# Patient Record
Sex: Male | Born: 1935 | Race: Black or African American | Hispanic: No | Marital: Married | Smoking: Former smoker
Health system: Southern US, Community
[De-identification: ages and names within clinical notes are randomized; demographics above are authoritative.]

## PROBLEM LIST (undated history)

## (undated) ENCOUNTER — Emergency Department (HOSPITAL_COMMUNITY): Admission: EM | Payer: Medicare Other | Source: Home / Self Care

## (undated) DIAGNOSIS — N529 Male erectile dysfunction, unspecified: Secondary | ICD-10-CM

## (undated) DIAGNOSIS — H409 Unspecified glaucoma: Secondary | ICD-10-CM

## (undated) DIAGNOSIS — I10 Essential (primary) hypertension: Secondary | ICD-10-CM

## (undated) DIAGNOSIS — R739 Hyperglycemia, unspecified: Secondary | ICD-10-CM

## (undated) DIAGNOSIS — F0390 Unspecified dementia without behavioral disturbance: Secondary | ICD-10-CM

## (undated) DIAGNOSIS — T7840XA Allergy, unspecified, initial encounter: Secondary | ICD-10-CM

## (undated) DIAGNOSIS — E785 Hyperlipidemia, unspecified: Secondary | ICD-10-CM

## (undated) DIAGNOSIS — K648 Other hemorrhoids: Secondary | ICD-10-CM

## (undated) DIAGNOSIS — K5730 Diverticulosis of large intestine without perforation or abscess without bleeding: Secondary | ICD-10-CM

## (undated) DIAGNOSIS — K589 Irritable bowel syndrome without diarrhea: Secondary | ICD-10-CM

## (undated) HISTORY — DX: Hyperglycemia, unspecified: R73.9

## (undated) HISTORY — DX: Unspecified glaucoma: H40.9

## (undated) HISTORY — DX: Male erectile dysfunction, unspecified: N52.9

## (undated) HISTORY — DX: Unspecified dementia, unspecified severity, without behavioral disturbance, psychotic disturbance, mood disturbance, and anxiety: F03.90

## (undated) HISTORY — PX: NASAL SINUS SURGERY: SHX719

## (undated) HISTORY — PX: INGUINAL HERNIA REPAIR: SUR1180

## (undated) HISTORY — DX: Diverticulosis of large intestine without perforation or abscess without bleeding: K57.30

## (undated) HISTORY — PX: EYE SURGERY: SHX253

## (undated) HISTORY — PX: KNEE ARTHROSCOPY: SHX127

## (undated) HISTORY — DX: Allergy, unspecified, initial encounter: T78.40XA

## (undated) HISTORY — DX: Other hemorrhoids: K64.8

## (undated) HISTORY — DX: Hyperlipidemia, unspecified: E78.5

## (undated) HISTORY — DX: Essential (primary) hypertension: I10

## (undated) HISTORY — DX: Irritable bowel syndrome, unspecified: K58.9

---

## 1998-06-01 ENCOUNTER — Ambulatory Visit (HOSPITAL_BASED_OUTPATIENT_CLINIC_OR_DEPARTMENT_OTHER): Admission: RE | Admit: 1998-06-01 | Discharge: 1998-06-01 | Payer: Self-pay

## 1999-03-15 DIAGNOSIS — K648 Other hemorrhoids: Secondary | ICD-10-CM

## 1999-03-15 HISTORY — DX: Other hemorrhoids: K64.8

## 2002-03-12 ENCOUNTER — Encounter: Payer: Self-pay | Admitting: Orthopedic Surgery

## 2002-03-12 ENCOUNTER — Ambulatory Visit (HOSPITAL_COMMUNITY): Admission: RE | Admit: 2002-03-12 | Discharge: 2002-03-12 | Payer: Self-pay | Admitting: Orthopedic Surgery

## 2005-04-28 ENCOUNTER — Inpatient Hospital Stay (HOSPITAL_COMMUNITY): Admission: EM | Admit: 2005-04-28 | Discharge: 2005-04-30 | Payer: Self-pay | Admitting: Emergency Medicine

## 2005-08-18 ENCOUNTER — Ambulatory Visit: Payer: Self-pay | Admitting: Internal Medicine

## 2005-08-31 ENCOUNTER — Ambulatory Visit: Payer: Self-pay | Admitting: Family Medicine

## 2005-09-07 ENCOUNTER — Ambulatory Visit: Payer: Self-pay | Admitting: Family Medicine

## 2005-10-10 ENCOUNTER — Ambulatory Visit: Payer: Self-pay | Admitting: Family Medicine

## 2006-02-08 ENCOUNTER — Ambulatory Visit: Payer: Self-pay | Admitting: Family Medicine

## 2006-03-07 ENCOUNTER — Ambulatory Visit: Payer: Self-pay | Admitting: Family Medicine

## 2006-08-29 ENCOUNTER — Ambulatory Visit: Payer: Self-pay | Admitting: Family Medicine

## 2006-09-05 ENCOUNTER — Ambulatory Visit: Payer: Self-pay | Admitting: Family Medicine

## 2007-06-12 ENCOUNTER — Ambulatory Visit: Payer: Self-pay | Admitting: Family Medicine

## 2007-07-09 ENCOUNTER — Ambulatory Visit: Payer: Self-pay | Admitting: Family Medicine

## 2007-07-09 LAB — CONVERTED CEMR LAB
ALT: 26 units/L (ref 0–53)
AST: 27 units/L (ref 0–37)
Albumin: 4 g/dL (ref 3.5–5.2)
Alkaline Phosphatase: 45 units/L (ref 39–117)
BUN: 9 mg/dL (ref 6–23)
Basophils Absolute: 0 10*3/uL (ref 0.0–0.1)
Basophils Relative: 0.5 % (ref 0.0–1.0)
Bilirubin, Direct: 0.1 mg/dL (ref 0.0–0.3)
CO2: 30 meq/L (ref 19–32)
Calcium: 9.4 mg/dL (ref 8.4–10.5)
Chloride: 104 meq/L (ref 96–112)
Cholesterol: 217 mg/dL (ref 0–200)
Creatinine, Ser: 1.1 mg/dL (ref 0.4–1.5)
Direct LDL: 132.4 mg/dL
Eosinophils Absolute: 2 10*3/uL — ABNORMAL HIGH (ref 0.0–0.6)
Eosinophils Relative: 38.4 % — ABNORMAL HIGH (ref 0.0–5.0)
GFR calc Af Amer: 85 mL/min
GFR calc non Af Amer: 71 mL/min
Glucose, Bld: 109 mg/dL — ABNORMAL HIGH (ref 70–99)
HCT: 40.3 % (ref 39.0–52.0)
HDL: 60.9 mg/dL (ref >39.0)
Hemoglobin: 13.9 g/dL (ref 13.0–17.0)
Lymphocytes Relative: 26.3 % (ref 12.0–46.0)
MCHC: 34.4 g/dL (ref 30.0–36.0)
MCV: 81.7 fL (ref 78.0–100.0)
Monocytes Absolute: 0.6 10*3/uL (ref 0.2–0.7)
Monocytes Relative: 11 % (ref 3.0–11.0)
Neutro Abs: 1.3 10*3/uL — ABNORMAL LOW (ref 1.4–7.7)
Neutrophils Relative %: 23.8 % — ABNORMAL LOW (ref 43.0–77.0)
PSA: 1.02 ng/mL (ref 0.10–4.00)
Platelets: 304 10*3/uL (ref 150–400)
Potassium: 4 meq/L (ref 3.5–5.1)
RBC: 4.93 M/uL (ref 4.22–5.81)
RDW: 13.4 % (ref 11.5–14.6)
Sodium: 142 meq/L (ref 135–145)
TSH: 1.09 microintl units/mL (ref 0.35–5.50)
Total Bilirubin: 0.8 mg/dL (ref 0.3–1.2)
Total CHOL/HDL Ratio: 3.6
Total Protein: 6.5 g/dL (ref 6.0–8.3)
Triglycerides: 102 mg/dL (ref 0–149)
VLDL: 20 mg/dL (ref 0–40)
WBC: 5.3 10*3/uL (ref 4.5–10.5)

## 2007-07-11 DIAGNOSIS — J309 Allergic rhinitis, unspecified: Secondary | ICD-10-CM | POA: Insufficient documentation

## 2007-07-11 DIAGNOSIS — I10 Essential (primary) hypertension: Secondary | ICD-10-CM | POA: Insufficient documentation

## 2007-07-11 DIAGNOSIS — E785 Hyperlipidemia, unspecified: Secondary | ICD-10-CM | POA: Insufficient documentation

## 2007-07-12 ENCOUNTER — Ambulatory Visit: Payer: Self-pay | Admitting: Family Medicine

## 2007-07-12 LAB — CONVERTED CEMR LAB
Basophils Absolute: 0 10*3/uL (ref 0.0–0.1)
Basophils Relative: 0 % (ref 0.0–1.0)
Eosinophils Absolute: 1.2 10*3/uL — ABNORMAL HIGH (ref 0.0–0.6)
Eosinophils Relative: 25.1 % — ABNORMAL HIGH (ref 0.0–5.0)
HCT: 38.3 % — ABNORMAL LOW (ref 39.0–52.0)
Hemoglobin: 13.1 g/dL (ref 13.0–17.0)
Lymphocytes Relative: 24 % (ref 12.0–46.0)
MCHC: 34.3 g/dL (ref 30.0–36.0)
MCV: 80.9 fL (ref 78.0–100.0)
Monocytes Absolute: 0.5 10*3/uL (ref 0.2–0.7)
Monocytes Relative: 10.9 % (ref 3.0–11.0)
Neutro Abs: 1.9 10*3/uL (ref 1.4–7.7)
Neutrophils Relative %: 40 % — ABNORMAL LOW (ref 43.0–77.0)
Platelets: 276 10*3/uL (ref 150–400)
RBC: 4.74 M/uL (ref 4.22–5.81)
RDW: 13.4 % (ref 11.5–14.6)
WBC: 4.7 10*3/uL (ref 4.5–10.5)

## 2007-07-17 ENCOUNTER — Ambulatory Visit: Payer: Self-pay | Admitting: Family Medicine

## 2007-07-17 DIAGNOSIS — D721 Eosinophilia: Secondary | ICD-10-CM

## 2007-08-17 ENCOUNTER — Ambulatory Visit: Payer: Self-pay | Admitting: Family Medicine

## 2007-08-20 ENCOUNTER — Telehealth: Payer: Self-pay | Admitting: Family Medicine

## 2007-08-20 LAB — CONVERTED CEMR LAB
BUN: 14 mg/dL (ref 6–23)
Basophils Absolute: 0 10*3/uL (ref 0.0–0.1)
Basophils Relative: 0.3 % (ref 0.0–1.0)
CO2: 27 meq/L (ref 19–32)
Calcium: 9.4 mg/dL (ref 8.4–10.5)
Chloride: 105 meq/L (ref 96–112)
Creatinine, Ser: 1 mg/dL (ref 0.4–1.5)
Eosinophils Absolute: 0.4 10*3/uL (ref 0.0–0.6)
Eosinophils Relative: 12.5 % — ABNORMAL HIGH (ref 0.0–5.0)
GFR calc Af Amer: 95 mL/min
GFR calc non Af Amer: 79 mL/min
Glucose, Bld: 99 mg/dL (ref 70–99)
HCT: 40.6 % (ref 39.0–52.0)
Hemoglobin: 13.6 g/dL (ref 13.0–17.0)
Lymphocytes Relative: 35.1 % (ref 12.0–46.0)
MCHC: 33.4 g/dL (ref 30.0–36.0)
MCV: 82.5 fL (ref 78.0–100.0)
Monocytes Absolute: 0.5 10*3/uL (ref 0.2–0.7)
Monocytes Relative: 16.2 % — ABNORMAL HIGH (ref 3.0–11.0)
Neutro Abs: 1.1 10*3/uL — ABNORMAL LOW (ref 1.4–7.7)
Neutrophils Relative %: 35.9 % — ABNORMAL LOW (ref 43.0–77.0)
Platelets: 228 10*3/uL (ref 150–400)
Potassium: 4.3 meq/L (ref 3.5–5.1)
RBC: 4.93 M/uL (ref 4.22–5.81)
RDW: 13.7 % (ref 11.5–14.6)
Sodium: 140 meq/L (ref 135–145)
WBC: 3.2 10*3/uL — ABNORMAL LOW (ref 4.5–10.5)

## 2007-10-10 ENCOUNTER — Ambulatory Visit: Payer: Self-pay | Admitting: Family Medicine

## 2007-10-10 DIAGNOSIS — I479 Paroxysmal tachycardia, unspecified: Secondary | ICD-10-CM | POA: Insufficient documentation

## 2007-10-10 DIAGNOSIS — B009 Herpesviral infection, unspecified: Secondary | ICD-10-CM | POA: Insufficient documentation

## 2007-10-16 ENCOUNTER — Ambulatory Visit: Payer: Self-pay | Admitting: Family Medicine

## 2008-04-01 ENCOUNTER — Ambulatory Visit: Payer: Self-pay | Admitting: Family Medicine

## 2008-04-02 LAB — CONVERTED CEMR LAB
ALT: 22 units/L (ref 0–53)
AST: 25 units/L (ref 0–37)
Albumin: 4.2 g/dL (ref 3.5–5.2)
Alkaline Phosphatase: 46 units/L (ref 39–117)
Bilirubin, Direct: 0.2 mg/dL (ref 0.0–0.3)
Cholesterol: 270 mg/dL (ref 0–200)
Direct LDL: 172 mg/dL
HDL: 76.5 mg/dL (ref >39.0)
Total Bilirubin: 0.9 mg/dL (ref 0.3–1.2)
Total CHOL/HDL Ratio: 3.5
Total Protein: 6.6 g/dL (ref 6.0–8.3)
Triglycerides: 111 mg/dL (ref 0–149)
VLDL: 22 mg/dL (ref 0–40)

## 2008-04-29 ENCOUNTER — Telehealth: Payer: Self-pay | Admitting: Family Medicine

## 2008-08-28 ENCOUNTER — Ambulatory Visit: Payer: Self-pay | Admitting: Family Medicine

## 2008-08-28 LAB — CONVERTED CEMR LAB
Bilirubin Urine: NEGATIVE
Blood in Urine, dipstick: NEGATIVE
Glucose, Urine, Semiquant: NEGATIVE
Ketones, urine, test strip: NEGATIVE
Nitrite: NEGATIVE
Protein, U semiquant: NEGATIVE
Specific Gravity, Urine: 1.015
Urobilinogen, UA: 0.2
WBC Urine, dipstick: NEGATIVE
pH: 7.5

## 2008-09-02 LAB — CONVERTED CEMR LAB
ALT: 26 units/L (ref 0–53)
AST: 28 units/L (ref 0–37)
Albumin: 3.8 g/dL (ref 3.5–5.2)
Alkaline Phosphatase: 63 units/L (ref 39–117)
BUN: 5 mg/dL — ABNORMAL LOW (ref 6–23)
Basophils Absolute: 0 10*3/uL (ref 0.0–0.1)
Basophils Relative: 0.9 % (ref 0.0–3.0)
Bilirubin, Direct: 0.1 mg/dL (ref 0.0–0.3)
CO2: 33 meq/L — ABNORMAL HIGH (ref 19–32)
Calcium: 9.6 mg/dL (ref 8.4–10.5)
Chloride: 106 meq/L (ref 96–112)
Cholesterol: 142 mg/dL (ref 0–200)
Creatinine, Ser: 1 mg/dL (ref 0.4–1.5)
Eosinophils Absolute: 0.2 10*3/uL (ref 0.0–0.7)
Eosinophils Relative: 5.9 % — ABNORMAL HIGH (ref 0.0–5.0)
GFR calc Af Amer: 95 mL/min
GFR calc non Af Amer: 79 mL/min
Glucose, Bld: 121 mg/dL — ABNORMAL HIGH (ref 70–99)
HCT: 40.9 % (ref 39.0–52.0)
HDL: 56.8 mg/dL (ref >39.0)
Hemoglobin: 13.7 g/dL (ref 13.0–17.0)
LDL Cholesterol: 68 mg/dL (ref 0–99)
Lymphocytes Relative: 30 % (ref 12.0–46.0)
MCHC: 33.5 g/dL (ref 30.0–36.0)
MCV: 81 fL (ref 78.0–100.0)
Monocytes Absolute: 0.4 10*3/uL (ref 0.1–1.0)
Monocytes Relative: 12.5 % — ABNORMAL HIGH (ref 3.0–12.0)
Neutro Abs: 1.9 10*3/uL (ref 1.4–7.7)
Neutrophils Relative %: 50.7 % (ref 43.0–77.0)
PSA: 0.95 ng/mL (ref 0.10–4.00)
Platelets: 313 10*3/uL (ref 150–400)
Potassium: 4 meq/L (ref 3.5–5.1)
RBC: 5.04 M/uL (ref 4.22–5.81)
RDW: 12.5 % (ref 11.5–14.6)
Sodium: 146 meq/L — ABNORMAL HIGH (ref 135–145)
TSH: 1.17 microintl units/mL (ref 0.35–5.50)
Total Bilirubin: 0.7 mg/dL (ref 0.3–1.2)
Total CHOL/HDL Ratio: 2.5
Total Protein: 7.1 g/dL (ref 6.0–8.3)
Triglycerides: 87 mg/dL (ref 0–149)
VLDL: 17 mg/dL (ref 0–40)
WBC: 3.6 10*3/uL — ABNORMAL LOW (ref 4.5–10.5)

## 2008-09-11 ENCOUNTER — Ambulatory Visit: Payer: Self-pay | Admitting: Family Medicine

## 2009-04-06 ENCOUNTER — Telehealth: Payer: Self-pay | Admitting: Family Medicine

## 2009-04-09 ENCOUNTER — Ambulatory Visit: Payer: Self-pay | Admitting: Family Medicine

## 2009-04-13 ENCOUNTER — Encounter: Payer: Self-pay | Admitting: Family Medicine

## 2009-04-13 LAB — CONVERTED CEMR LAB
ALT: 19 units/L (ref 0–53)
AST: 24 units/L (ref 0–37)
Albumin: 4.2 g/dL (ref 3.5–5.2)
Alkaline Phosphatase: 42 units/L (ref 39–117)
Bilirubin, Direct: 0.1 mg/dL (ref 0.0–0.3)
Cholesterol: 176 mg/dL (ref 0–200)
HDL: 81.5 mg/dL (ref >39.00)
Hgb A1c MFr Bld: 5.6 % (ref 4.6–6.5)
LDL Cholesterol: 79 mg/dL (ref 0–99)
Total Bilirubin: 0.8 mg/dL (ref 0.3–1.2)
Total CHOL/HDL Ratio: 2
Total Protein: 6.8 g/dL (ref 6.0–8.3)
Triglycerides: 76 mg/dL (ref 0.0–149.0)
VLDL: 15.2 mg/dL (ref 0.0–40.0)

## 2009-09-10 ENCOUNTER — Ambulatory Visit: Payer: Self-pay | Admitting: Family Medicine

## 2009-09-10 LAB — CONVERTED CEMR LAB
Bilirubin Urine: NEGATIVE
Blood in Urine, dipstick: NEGATIVE
Glucose, Urine, Semiquant: NEGATIVE
Nitrite: NEGATIVE
Protein, U semiquant: NEGATIVE
Specific Gravity, Urine: 1.02
Urobilinogen, UA: 0.2
WBC Urine, dipstick: NEGATIVE
pH: 7

## 2009-09-14 LAB — CONVERTED CEMR LAB
ALT: 18 units/L (ref 0–53)
AST: 26 units/L (ref 0–37)
Albumin: 4.2 g/dL (ref 3.5–5.2)
Alkaline Phosphatase: 45 units/L (ref 39–117)
BUN: 14 mg/dL (ref 6–23)
Basophils Absolute: 0 10*3/uL (ref 0.0–0.1)
Basophils Relative: 0.7 % (ref 0.0–3.0)
Bilirubin, Direct: 0.1 mg/dL (ref 0.0–0.3)
CO2: 29 meq/L (ref 19–32)
Calcium: 9.3 mg/dL (ref 8.4–10.5)
Chloride: 107 meq/L (ref 96–112)
Cholesterol: 166 mg/dL (ref 0–200)
Creatinine, Ser: 1.2 mg/dL (ref 0.4–1.5)
Eosinophils Absolute: 0.3 10*3/uL (ref 0.0–0.7)
Eosinophils Relative: 5 % (ref 0.0–5.0)
GFR calc non Af Amer: 76.54 mL/min (ref >60)
Glucose, Bld: 102 mg/dL — ABNORMAL HIGH (ref 70–99)
HCT: 42.7 % (ref 39.0–52.0)
HDL: 79.4 mg/dL (ref >39.00)
Hemoglobin: 14.1 g/dL (ref 13.0–17.0)
LDL Cholesterol: 71 mg/dL (ref 0–99)
Lymphocytes Relative: 19.4 % (ref 12.0–46.0)
Lymphs Abs: 1.1 10*3/uL (ref 0.7–4.0)
MCHC: 33 g/dL (ref 30.0–36.0)
MCV: 84.2 fL (ref 78.0–100.0)
Monocytes Absolute: 0.6 10*3/uL (ref 0.1–1.0)
Monocytes Relative: 11 % (ref 3.0–12.0)
Neutro Abs: 3.6 10*3/uL (ref 1.4–7.7)
Neutrophils Relative %: 63.9 % (ref 43.0–77.0)
PSA: 0.89 ng/mL (ref 0.10–4.00)
Platelets: 214 10*3/uL (ref 150.0–400.0)
Potassium: 3.9 meq/L (ref 3.5–5.1)
RBC: 5.07 M/uL (ref 4.22–5.81)
RDW: 12.6 % (ref 11.5–14.6)
Sodium: 144 meq/L (ref 135–145)
TSH: 1.52 microintl units/mL (ref 0.35–5.50)
Total Bilirubin: 0.9 mg/dL (ref 0.3–1.2)
Total CHOL/HDL Ratio: 2
Total Protein: 6.9 g/dL (ref 6.0–8.3)
Triglycerides: 76 mg/dL (ref 0.0–149.0)
VLDL: 15.2 mg/dL (ref 0.0–40.0)
WBC: 5.6 10*3/uL (ref 4.5–10.5)

## 2009-09-17 ENCOUNTER — Ambulatory Visit: Payer: Self-pay | Admitting: Family Medicine

## 2010-07-30 ENCOUNTER — Ambulatory Visit: Payer: Self-pay | Admitting: Family Medicine

## 2010-07-30 DIAGNOSIS — M25519 Pain in unspecified shoulder: Secondary | ICD-10-CM | POA: Insufficient documentation

## 2010-09-16 ENCOUNTER — Ambulatory Visit: Payer: Self-pay | Admitting: Family Medicine

## 2010-09-16 LAB — CONVERTED CEMR LAB
Bilirubin Urine: NEGATIVE
Blood in Urine, dipstick: NEGATIVE
Glucose, Urine, Semiquant: NEGATIVE
Ketones, urine, test strip: NEGATIVE
Nitrite: NEGATIVE
Protein, U semiquant: NEGATIVE
Specific Gravity, Urine: 1.02
Urobilinogen, UA: 0.2
WBC Urine, dipstick: NEGATIVE
pH: 7

## 2010-09-20 LAB — CONVERTED CEMR LAB
ALT: 17 units/L (ref 0–53)
AST: 24 units/L (ref 0–37)
Albumin: 4.2 g/dL (ref 3.5–5.2)
Alkaline Phosphatase: 43 units/L (ref 39–117)
BUN: 11 mg/dL (ref 6–23)
Basophils Absolute: 0 10*3/uL (ref 0.0–0.1)
Basophils Relative: 0.8 % (ref 0.0–3.0)
Bilirubin, Direct: 0.1 mg/dL (ref 0.0–0.3)
CO2: 26 meq/L (ref 19–32)
Calcium: 9.2 mg/dL (ref 8.4–10.5)
Chloride: 101 meq/L (ref 96–112)
Cholesterol: 263 mg/dL — ABNORMAL HIGH (ref 0–200)
Creatinine, Ser: 1 mg/dL (ref 0.4–1.5)
Direct LDL: 166.6 mg/dL
Eosinophils Absolute: 0.3 10*3/uL (ref 0.0–0.7)
Eosinophils Relative: 10.9 % — ABNORMAL HIGH (ref 0.0–5.0)
GFR calc non Af Amer: 92.07 mL/min (ref >60)
Glucose, Bld: 97 mg/dL (ref 70–99)
HCT: 41.9 % (ref 39.0–52.0)
HDL: 72.8 mg/dL (ref >39.00)
Hemoglobin: 13.9 g/dL (ref 13.0–17.0)
Lymphocytes Relative: 41.5 % (ref 12.0–46.0)
Lymphs Abs: 1.3 10*3/uL (ref 0.7–4.0)
MCHC: 33.2 g/dL (ref 30.0–36.0)
MCV: 83.3 fL (ref 78.0–100.0)
Monocytes Absolute: 0.3 10*3/uL (ref 0.1–1.0)
Monocytes Relative: 10.8 % (ref 3.0–12.0)
Neutro Abs: 1.1 10*3/uL — ABNORMAL LOW (ref 1.4–7.7)
Neutrophils Relative %: 36 % — ABNORMAL LOW (ref 43.0–77.0)
PSA: 0.74 ng/mL (ref 0.10–4.00)
Platelets: 216 10*3/uL (ref 150.0–400.0)
Potassium: 4.4 meq/L (ref 3.5–5.1)
RBC: 5.03 M/uL (ref 4.22–5.81)
RDW: 14.4 % (ref 11.5–14.6)
Sodium: 137 meq/L (ref 135–145)
TSH: 1.53 microintl units/mL (ref 0.35–5.50)
Total Bilirubin: 0.5 mg/dL (ref 0.3–1.2)
Total CHOL/HDL Ratio: 4
Total Protein: 6.2 g/dL (ref 6.0–8.3)
Triglycerides: 122 mg/dL (ref 0.0–149.0)
VLDL: 24.4 mg/dL (ref 0.0–40.0)
WBC: 3.2 10*3/uL — ABNORMAL LOW (ref 4.5–10.5)

## 2010-09-23 ENCOUNTER — Ambulatory Visit: Payer: Self-pay | Admitting: Family Medicine

## 2010-12-06 ENCOUNTER — Telehealth: Payer: Self-pay | Admitting: Family Medicine

## 2010-12-07 ENCOUNTER — Ambulatory Visit: Payer: Self-pay | Admitting: Family Medicine

## 2011-01-07 ENCOUNTER — Other Ambulatory Visit: Payer: Self-pay | Admitting: Family Medicine

## 2011-01-07 LAB — HEPATIC FUNCTION PANEL
ALT: 32 U/L (ref 0–53)
AST: 32 U/L (ref 0–37)
Albumin: 4.5 g/dL (ref 3.5–5.2)
Alkaline Phosphatase: 53 U/L (ref 39–117)
Bilirubin, Direct: 0.1 mg/dL (ref 0.0–0.3)
Total Bilirubin: 0.8 mg/dL (ref 0.3–1.2)
Total Protein: 6.8 g/dL (ref 6.0–8.3)

## 2011-01-07 LAB — LIPID PANEL
Cholesterol: 164 mg/dL (ref 0–200)
HDL: 82.5 mg/dL (ref >39.00)
LDL Cholesterol: 72 mg/dL (ref 0–99)
Total CHOL/HDL Ratio: 2
Triglycerides: 49 mg/dL (ref 0.0–149.0)
VLDL: 9.8 mg/dL (ref 0.0–40.0)

## 2011-01-25 NOTE — Progress Notes (Signed)
Summary: LAB REQ  Phone Note Call from Patient Call back at [phone]   Caller: Patient Call For: DR Sadye Kiernan Summary of Call: PT WOULD LIKE REPEAT CHOLE AND GLUCOSE WAS A LITTLE ELEVATED. Initial call taken by: Heron Sabins,  April 06, 2009 3:28 PM  Follow-up for Phone Call        order lipids, liver, and A1c for 272.4 Follow-up by: [name] MD,  April 06, 2009 5:14 PM  Additional Follow-up for Phone Call Additional follow up Details #1::        sch for labs fasting 04-09-2009 8am Additional Follow-up by: Heron Sabins,  April 08, 2009 8:58 AM

## 2011-01-25 NOTE — Progress Notes (Signed)
Summary: printed rx   Phone Note Call from Patient Call back at [phone]   Caller: pt live Call For: [name] [name]  Summary of Call: Pt says he was in a few weeks ago and you told him you would fax an rx for Zocor 40mg  to [facility].  [facility] has no record.  Please print and advise patient when he can pick up the rx.  He also needs rx to go to [facility] for Viagra 100 mg +-  Initial call taken by: Roselle Locus,  Apr 29, 2008 10:43 AM  Follow-up for Phone Call        done, in your box Follow-up by: [name] MD,  Apr 29, 2008 2:40 PM  Additional Follow-up for Phone Call Additional follow up Details #1::        Phone Call Completed, rx up front, pt aware Additional Follow-up by: Alfred Levins, CMA,  Apr 29, 2008 5:03 PM      Prescriptions: VIAGRA 100 MG  TABS (SILDENAFIL CITRATE) as needed  #30 x 3   Entered and Authorized by:   [name] MD   Signed by:   [name] MD on 04/29/2008   Method used:   Print then Give to Patient   RxID:   [id number] ZOCOR 40 MG  TABS (SIMVASTATIN) 1 by mouth once daily  #90 x 3   Entered and Authorized by:   [name] MD   Signed by:   [name] MD on 04/29/2008   Method used:   Print then Give to Patient   RxID:   [id number]

## 2011-01-25 NOTE — Assessment & Plan Note (Signed)
Summary: DISCUSS LABS/CDW   Vital Signs:  Patient Profile:   76 Years Old Male Weight:      203 pounds (92.27 kg) Temp:     98.6 degrees F (37.00 degrees C) oral Pulse rate:   92 / minute BP sitting:   98 / 54  (left arm)  Vitals Entered By: Alfred Levins, CMA (July 17, 2007 9:11 AM)               Chief Complaint:  DISCUSS LABS.  History of Present Illness: was here last week for cpx labs. Feels great. His eosinophil count was up to 38% at first then was at 25% on recheck.  On same rx meds for years but he does take a tremendous amount of high dose vitamins and herbs every day.  Current Allergies: No known allergies      Review of Systems      See HPI   Physical Exam  General:     Well-developed,well-nourished,in no acute distress; alert,appropriate and cooperative throughout examination    Impression & Recommendations:  Problem # 1:  EOSINOPHILIA (ICD-288.3) stop all otc meds, herbs, and vitamins.   Patient Instructions: 1)  Please schedule a follow-up appointment in 1 month.

## 2011-01-25 NOTE — Assessment & Plan Note (Signed)
Summary: cpx/njr   Vital Signs:  Patient profile:   76 year old male Height:      70.75 inches Weight:      203 pounds BMI:     28.62 Temp:     98.2 degrees F oral BP sitting:   120 / 78  (left arm) Cuff size:   regular  Vitals Entered By: Kathrynn Speed CMA (September 23, 2010 9:04 AM) CC: [email or web address], lab review, src Is Patient Diabetic? No   History of Present Illness: 76 yr old male for a cpx. He is doing well. We switched him from Zocor to Lipitor about 6 weeks ago for some muscle aches, and these have resolved. He watches his diet closely.   Preventive Screening-Counseling & Management  Alcohol-Tobacco     Smoking Status: never  Current Medications (verified): 1)  Aspir-81 81 Mg  Tbec (Aspirin) .Marland Kitchen.. 1 By Mouth Once Daily 2)  Terazosin Hcl 2 Mg  Caps (Terazosin Hcl) .... 2 By Mouth Once Daily 3)  Hydrochlorothiazide 12.5 Mg  Tabs (Hydrochlorothiazide) .Marland Kitchen.. 1 By Mouth Once Daily 4)  Cartia Xt 300 Mg  Cp24 (Diltiazem Hcl Coated Beads) .Marland Kitchen.. 1 By Mouth Once Daily 5)  Zyrtec Allergy 10 Mg  Tabs (Cetirizine Hcl) .Marland Kitchen.. 1 By Mouth Once Daily 6)  Glucosamine-Chondroitin 250-200 Mg  Tabs (Glucosamine-Chondroitin) .Marland Kitchen.. 1 By Mouth Once Daily 7)  Diclofenac Sodium 50 Mg Tbec (Diclofenac Sodium) .... Three Times A Day As Needed Pain 8)  Lipitor 40 Mg Tabs (Atorvastatin Calcium) .Marland Kitchen.. 1 By Mouth Once Daily  Allergies (verified): No Known Drug Allergies  Past History:  Past Medical History: Reviewed history from 09/17/2009 and no changes required. Allergic rhinitis Hyperlipidemia Hypertension hyperglycemia ED sees Dr. Clydene Pugh for routine eye exams  Past Surgical History: Reviewed history from 09/11/2008 and no changes required. Surgical repair of right inguinal hernia Arthoscopy on right knee Sinus surgery colonoscopy 2005 normal, per Dr. Jarold Motto (next in 7 yrs) Cataract extraction bilaterally per Dr. Jettie Pagan  Family History: Reviewed history from 10/16/2007 and no changes  required. Family History Hypertension Family History Lung cancer  Social History: Reviewed history from 10/16/2007 and no changes required. Retired Married Never Smoked Alcohol use-no  Review of Systems  The patient denies anorexia, fever, weight loss, weight gain, vision loss, decreased hearing, hoarseness, chest pain, syncope, dyspnea on exertion, peripheral edema, prolonged cough, headaches, hemoptysis, abdominal pain, melena, hematochezia, severe indigestion/heartburn, hematuria, incontinence, genital sores, muscle weakness, suspicious skin lesions, transient blindness, difficulty walking, depression, unusual weight change, abnormal bleeding, enlarged lymph nodes, angioedema, breast masses, and testicular masses.    Physical Exam  General:  Well-developed,well-nourished,in no acute distress; alert,appropriate and cooperative throughout examination Head:  Normocephalic and atraumatic without obvious abnormalities. No apparent alopecia or balding. Eyes:  No corneal or conjunctival inflammation noted. EOMI. Perrla. Funduscopic exam benign, without hemorrhages, exudates or papilledema. Vision grossly normal. Ears:  External ear exam shows no significant lesions or deformities.  Otoscopic examination reveals clear canals, tympanic membranes are intact bilaterally without bulging, retraction, inflammation or discharge. Hearing is grossly normal bilaterally. Nose:  External nasal examination shows no deformity or inflammation. Nasal mucosa are pink and moist without lesions or exudates. Mouth:  Oral mucosa and oropharynx without lesions or exudates.  Teeth in good repair. Neck:  No deformities, masses, or tenderness noted. Chest Wall:  No deformities, masses, tenderness or gynecomastia noted. Lungs:  Normal respiratory effort, chest expands symmetrically. Lungs are clear to auscultation, no crackles or wheezes. Heart:  Normal rate and  regular rhythm. S1 and S2 normal without gallop, murmur,  click, rub or other extra sounds. Abdomen:  Bowel sounds positive,abdomen soft and non-tender without masses, organomegaly or hernias noted. Rectal:  No external abnormalities noted. Normal sphincter tone. No rectal masses or tenderness. heme neg Genitalia:  Testes bilaterally descended without nodularity, tenderness or masses. No scrotal masses or lesions. No penis lesions or urethral discharge. Prostate:  Prostate gland firm and smooth, no enlargement, nodularity, tenderness, mass, asymmetry or induration. Msk:  No deformity or scoliosis noted of thoracic or lumbar spine.   Pulses:  R and L carotid,radial,femoral,dorsalis pedis and posterior tibial pulses are full and equal bilaterally Extremities:  No clubbing, cyanosis, edema, or deformity noted with normal full range of motion of all joints.   Neurologic:  No cranial nerve deficits noted. Station and gait are normal. Plantar reflexes are down-going bilaterally. DTRs are symmetrical throughout. Sensory, motor and coordinative functions appear intact. Skin:  Intact without suspicious lesions or rashes Cervical Nodes:  No lymphadenopathy noted Axillary Nodes:  No palpable lymphadenopathy Inguinal Nodes:  No significant adenopathy Psych:  Cognition and judgment appear intact. Alert and cooperative with normal attention span and concentration. No apparent delusions, illusions, hallucinations   Impression & Recommendations:  Problem # 1:  EXAMINATION, ROUTINE MEDICAL (ICD-V70.0)  Orders: Hemoccult Guaiac-1 spec.(in office) (82270) EKG w/ Interpretation (93000)  Complete Medication List: 1)  Aspir-81 81 Mg Tbec (Aspirin) .Marland Kitchen.. 1 by mouth once daily 2)  Terazosin Hcl 2 Mg Caps (Terazosin hcl) .... 2 by mouth once daily 3)  Hydrochlorothiazide 12.5 Mg Tabs (Hydrochlorothiazide) .Marland Kitchen.. 1 by mouth once daily 4)  Cartia Xt 300 Mg Cp24 (Diltiazem hcl coated beads) .Marland Kitchen.. 1 by mouth once daily 5)  Zyrtec Allergy 10 Mg Tabs (Cetirizine hcl) .Marland Kitchen.. 1 by  mouth once daily 6)  Glucosamine-chondroitin 250-200 Mg Tabs (Glucosamine-chondroitin) .Marland Kitchen.. 1 by mouth once daily 7)  Diclofenac Sodium 50 Mg Tbec (Diclofenac sodium) .... Three times a day as needed pain 8)  Lipitor 40 Mg Tabs (Atorvastatin calcium) .Marland Kitchen.. 1 by mouth once daily  Other Orders: Flu Vaccine 70yrs + (43329) Admin 1st Vaccine (51884)  Patient Instructions: 1)  Please schedule a follow-up appointment in 6 months .  Prescriptions: LIPITOR 40 MG TABS (ATORVASTATIN CALCIUM) 1 by mouth once daily  #90 x 3   Entered and Authorized by:   [name] MD   Signed by:   [name] MD on 09/23/2010   Method used:   Print then Give to Patient   RxID:   [id number] CARTIA XT 300 MG  CP24 (DILTIAZEM HCL COATED BEADS) 1 by mouth once daily  #90 x 3   Entered and Authorized by:   [name] MD   Signed by:   [name] MD on 09/23/2010   Method used:   Print then Give to Patient   RxID:   [id number] HYDROCHLOROTHIAZIDE 12.5 MG  TABS (HYDROCHLOROTHIAZIDE) 1 by mouth once daily  #90 x 3   Entered and Authorized by:   [name] MD   Signed by:   [name] MD on 09/23/2010   Method used:   Print then Give to Patient   RxID:   [id number] TERAZOSIN HCL 2 MG  CAPS (TERAZOSIN HCL) 2 by mouth once daily  #180 x 3   Entered and Authorized by:   [name] MD   Signed by:   [name] MD on 09/23/2010   Method used:  Print then Give to Patient   RxID:   [id number]    Immunizations Administered:  Influenza Vaccine # 1:    Vaccine Type: Fluvax 3+    Site: left deltoid    Mfr: GlaxoSmithKline    Dose: 0.5 ml    Route: IM    Given by: Kathrynn Speed CMA    Exp. Date: 06/25/2011    Lot #: [id number]    VIS given: 07/20/10 version given September 23, 2010.  Flu Vaccine Consent Questions:    Do you have a history of severe allergic reactions to this vaccine? no    Any prior history of allergic reactions to egg and/or gelatin? no    Do you  have a sensitivity to the preservative Thimersol? no    Do you have a past history of Guillan-Barre Syndrome? no    Do you currently have an acute febrile illness? no    Have you ever had a severe reaction to latex? no    Vaccine information given and explained to patient? yes

## 2011-01-25 NOTE — Assessment & Plan Note (Signed)
Summary: roa cpx lab wk already dpne-smm   Vital Signs:  Patient Profile:   76 Years Old Male Height:     72 inches Weight:      201 pounds Temp:     97.9 degrees F oral Pulse rate:   90 / minute BP sitting:   112 / 60  (left arm) Cuff size:   large                 Chief Complaint:  cpx.  History of Present Illness: 76 yr old male for cpx. Was seen last week for an apparent outbreak of herpetic rash on his cheek. This has almost faded away completely. He would like to come off some of his BP meds if possible. His BP is always normal or slightly low at home.  Current Allergies: No known allergies   Past Medical History:    Reviewed history from 07/11/2007 and no changes required:       Allergic rhinitis       Hyperlipidemia       Hypertension  Past Surgical History:    Reviewed history from 07/11/2007 and no changes required:       Surgical repair of right inguinal hernia       Arthoscopy on right knee       Sinus surgery       colonoscopy 2005 normal, per Dr. Jarold Motto (next in 7 yrs)   Family History:    Reviewed history and no changes required:       Family History Hypertension       Family History Lung cancer  Social History:    Reviewed history and no changes required:       Retired       Married       Never Smoked       Alcohol use-no   Risk Factors:  Tobacco use:  never Alcohol use:  no   Review of Systems  The patient denies anorexia, fever, weight loss, vision loss, decreased hearing, hoarseness, chest pain, syncope, dyspnea on exhertion, peripheral edema, prolonged cough, hemoptysis, abdominal pain, melena, hematochezia, severe indigestion/heartburn, hematuria, incontinence, genital sores, muscle weakness, suspicious skin lesions, transient blindness, difficulty walking, depression, unusual weight change, abnormal bleeding, enlarged lymph nodes, angioedema, breast masses, and testicular masses.     Physical Exam  General:  Well-developed,well-nourished,in no acute distress; alert,appropriate and cooperative throughout examination Head:     Normocephalic and atraumatic without obvious abnormalities. No apparent alopecia or balding. Eyes:     No corneal or conjunctival inflammation noted. EOMI. Perrla. Funduscopic exam benign, without hemorrhages, exudates or papilledema. Vision grossly normal. Ears:     External ear exam shows no significant lesions or deformities.  Otoscopic examination reveals clear canals, tympanic membranes are intact bilaterally without bulging, retraction, inflammation or discharge. Hearing is grossly normal bilaterally. Nose:     External nasal examination shows no deformity or inflammation. Nasal mucosa are pink and moist without lesions or exudates. Mouth:     Oral mucosa and oropharynx without lesions or exudates.  Teeth in good repair. Neck:     No deformities, masses, or tenderness noted. Lungs:     Normal respiratory effort, chest expands symmetrically. Lungs are clear to auscultation, no crackles or wheezes. Heart:     Normal rate and regular rhythm. S1 and S2 normal without gallop, murmur, click, rub or other extra sounds. EKG normal. Abdomen:     Bowel sounds positive,abdomen soft and non-tender without masses, organomegaly  or hernias noted. Rectal:     No external abnormalities noted. Normal sphincter tone. No rectal masses or tenderness. Heme neg. Genitalia:     Testes bilaterally descended without nodularity, tenderness or masses. No scrotal masses or lesions. No penis lesions or urethral discharge. Prostate:     Prostate gland firm and smooth, no enlargement, nodularity, tenderness, mass, asymmetry or induration. Msk:     No deformity or scoliosis noted of thoracic or lumbar spine.   Pulses:     R and L carotid,radial,femoral,dorsalis pedis and posterior tibial pulses are full and equal bilaterally Extremities:     No clubbing, cyanosis, edema, or deformity noted with  normal full range of motion of all joints.   Neurologic:     No cranial nerve deficits noted. Station and gait are normal. Plantar reflexes are down-going bilaterally. DTRs are symmetrical throughout. Sensory, motor and coordinative functions appear intact. Skin:     Intact without suspicious lesions or rashes Cervical Nodes:     No lymphadenopathy noted Axillary Nodes:     No palpable lymphadenopathy Inguinal Nodes:     No significant adenopathy Psych:     Cognition and judgment appear intact. Alert and cooperative with normal attention span and concentration. No apparent delusions, illusions, hallucinations     Impression & Recommendations:  Problem # 1:  EXAMINATION, ROUTINE MEDICAL (ICD-V70.0)  lot [id number], EXP 30 jun 09, sanofi pasteur left deltoid IM, 0.5 cc. Orders: EKG w/ Interpretation (93000) Hemoccult Guaiac-1 spec.(in office) (64332)   Complete Medication List: 1)  Aspir-81 81 Mg Tbec (Aspirin) .Marland Kitchen.. 1 by mouth once daily 2)  Terazosin Hcl 2 Mg Caps (Terazosin hcl) .... 2 by mouth once daily 3)  Zocor 40 Mg Tabs (Simvastatin) .Marland Kitchen.. 1 by mouth once daily 4)  Hydrochlorothiazide 12.5 Mg Tabs (Hydrochlorothiazide) .Marland Kitchen.. 1 by mouth once daily 5)  Cartia Xt 300 Mg Cp24 (Diltiazem hcl coated beads) .Marland Kitchen.. 1 by mouth once daily 6)  Zyrtec Allergy 10 Mg Tabs (Cetirizine hcl) .Marland Kitchen.. 1 by mouth once daily 7)  Glucosamine-chondroitin 250-200 Mg Tabs (Glucosamine-chondroitin) .Marland Kitchen.. 1 by mouth once daily 8)  Viagra 100 Mg Tabs (Sildenafil citrate) .... As needed  Other Orders: Influenza Vaccine NON MCR (95188) Influenza Vaccine MCR (41660)   Patient Instructions: 1)  will stop Lisinopril. He will watch BP at home and let me know how it is doing in 3 weeks    Prescriptions: VIAGRA 100 MG  TABS (SILDENAFIL CITRATE) as needed  #10 x 11   Entered and Authorized by:   [name] MD   Signed by:   [name] MD on 10/16/2007   Method used:   Print then Give to Patient    RxID:   [id number]  ]  Tetanus/Td Immunization History:    Tetanus/Td # 1:  Historical (12/26/2002)  Pneumovax Immunization History:    Pneumovax # 1:  Historical (12/26/2002)  Influenza Vaccine    Vaccine Type: Fluvax MCR    Given by: Alfred Levins, CMA  Flu Vaccine Consent Questions    Do you have a history of severe allergic reactions to this vaccine? no    Any prior history of allergic reactions to egg and/or gelatin? no    Do you have a sensitivity to the preservative Thimersol? no    Do you have a past history of Guillan-Barre Syndrome? no    Do you currently have an acute febrile illness? no    Have you ever had a severe reaction to latex?  no    Vaccine information given and explained to patient? yes

## 2011-01-25 NOTE — Assessment & Plan Note (Signed)
Summary: physical //db   Vital Signs:  Patient Profile:   76 Years Old Male Height:     72 inches Weight:      204 pounds Temp:     97.9 degrees F oral Pulse rate:   92 / minute Pulse rhythm:   regular BP sitting:   122 / 80  (left arm) Cuff size:   large  Vitals Entered By: Alfred Levins, CMA (September 11, 2008 10:31 AM)                 Chief Complaint:  cpx.  History of Present Illness: 76 yr old male for cpx. Feels great.    Current Allergies (reviewed today): No known allergies   Past Medical History:    Reviewed history from 07/11/2007 and no changes required:       Allergic rhinitis       Hyperlipidemia       Hypertension       hyperglycemia       ED  Past Surgical History:    Reviewed history from 10/16/2007 and no changes required:       Surgical repair of right inguinal hernia       Arthoscopy on right knee       Sinus surgery       colonoscopy 2005 normal, per Dr. Jarold Motto (next in 7 yrs)       Cataract extraction bilaterally per Dr. Jettie Pagan          Family History:    Reviewed history from 10/16/2007 and no changes required:       Family History Hypertension       Family History Lung cancer  Social History:    Reviewed history from 10/16/2007 and no changes required:       Retired       Married       Never Smoked       Alcohol use-no    Review of Systems  The patient denies anorexia, fever, weight loss, weight gain, vision loss, decreased hearing, hoarseness, chest pain, syncope, dyspnea on exertion, peripheral edema, prolonged cough, headaches, hemoptysis, abdominal pain, melena, hematochezia, severe indigestion/heartburn, hematuria, incontinence, genital sores, muscle weakness, suspicious skin lesions, transient blindness, difficulty walking, depression, unusual weight change, abnormal bleeding, enlarged lymph nodes, angioedema, breast masses, and testicular masses.     Physical Exam  General:     Well-developed,well-nourished,in no  acute distress; alert,appropriate and cooperative throughout examination Head:     Normocephalic and atraumatic without obvious abnormalities. No apparent alopecia or balding. Eyes:     No corneal or conjunctival inflammation noted. EOMI. Perrla. Funduscopic exam benign, without hemorrhages, exudates or papilledema. Vision grossly normal. Ears:     External ear exam shows no significant lesions or deformities.  Otoscopic examination reveals clear canals, tympanic membranes are intact bilaterally without bulging, retraction, inflammation or discharge. Hearing is grossly normal bilaterally. Nose:     External nasal examination shows no deformity or inflammation. Nasal mucosa are pink and moist without lesions or exudates. Mouth:     Oral mucosa and oropharynx without lesions or exudates.  Teeth in good repair. Neck:     No deformities, masses, or tenderness noted. Chest Wall:     No deformities, masses, tenderness or gynecomastia noted. Lungs:     Normal respiratory effort, chest expands symmetrically. Lungs are clear to auscultation, no crackles or wheezes. Heart:     Normal rate and regular rhythm. S1 and S2 normal without gallop, murmur, click,  rub or other extra sounds. EKG normal. Abdomen:     Bowel sounds positive,abdomen soft and non-tender without masses, organomegaly or hernias noted. Rectal:     No external abnormalities noted. Normal sphincter tone. No rectal masses or tenderness. Heme neg. Genitalia:     Testes bilaterally descended without nodularity, tenderness or masses. No scrotal masses or lesions. No penis lesions or urethral discharge. Prostate:     Prostate gland firm and smooth, no enlargement, nodularity, tenderness, mass, asymmetry or induration. Msk:     No deformity or scoliosis noted of thoracic or lumbar spine.   Pulses:     R and L carotid,radial,femoral,dorsalis pedis and posterior tibial pulses are full and equal bilaterally Extremities:     No clubbing,  cyanosis, edema, or deformity noted with normal full range of motion of all joints.   Neurologic:     No cranial nerve deficits noted. Station and gait are normal. Plantar reflexes are down-going bilaterally. DTRs are symmetrical throughout. Sensory, motor and coordinative functions appear intact. Skin:     Intact without suspicious lesions or rashes Cervical Nodes:     No lymphadenopathy noted Axillary Nodes:     No palpable lymphadenopathy Inguinal Nodes:     No significant adenopathy Psych:     Cognition and judgment appear intact. Alert and cooperative with normal attention span and concentration. No apparent delusions, illusions, hallucinations    Impression & Recommendations:  Problem # 1:  EXAMINATION, ROUTINE MEDICAL (ICD-V70.0)  Orders: Hemoccult Guaiac-1 spec.(in office) (29562) EKG w/ Interpretation (93000)  Flu Vaccine Consent Questions     Do you have a history of severe allergic reactions to this vaccine? no    Any prior history of allergic reactions to egg and/or gelatin? no    Do you have a sensitivity to the preservative Thimersol? no    Do you have a past history of Guillan-Barre Syndrome? no    Do you currently have an acute febrile illness? no    Have you ever had a severe reaction to latex? no    Vaccine information given and explained to patient? yes    Are you currently pregnant? no    Lot Number:[id number]   Site Given  Left Deltoid IM   Complete Medication List: 1)  Aspir-81 81 Mg Tbec (Aspirin) .Marland Kitchen.. 1 by mouth once daily 2)  Terazosin Hcl 2 Mg Caps (Terazosin hcl) .... 2 by mouth once daily 3)  Zocor 40 Mg Tabs (Simvastatin) .Marland Kitchen.. 1 by mouth once daily 4)  Hydrochlorothiazide 12.5 Mg Tabs (Hydrochlorothiazide) .Marland Kitchen.. 1 by mouth once daily 5)  Cartia Xt 300 Mg Cp24 (Diltiazem hcl coated beads) .Marland Kitchen.. 1 by mouth once daily 6)  Zyrtec Allergy 10 Mg Tabs (Cetirizine hcl) .Marland Kitchen.. 1 by mouth once daily 7)  Glucosamine-chondroitin 250-200 Mg Tabs  (Glucosamine-chondroitin) .Marland Kitchen.. 1 by mouth once daily 8)  Viagra 100 Mg Tabs (Sildenafil citrate) .... As needed  Other Orders: Flu Vaccine 10yrs + (13086) Admin of Therapeutic Inj (IM or [place]) (57846)   Patient Instructions: 1)  Please schedule a follow-up appointment in 6 months.   Prescriptions: VIAGRA 100 MG  TABS (SILDENAFIL CITRATE) as needed  #30 x 3   Entered and Authorized by:   [name] MD   Signed by:   [name] MD on 09/11/2008   Method used:   Print then Give to Patient   RxID:   [id number] CARTIA XT 300 MG  CP24 (DILTIAZEM HCL COATED BEADS) 1 by mouth once  daily  #90 x 3   Entered and Authorized by:   [name] MD   Signed by:   [name] MD on 09/11/2008   Method used:   Print then Give to Patient   RxID:   [id number] HYDROCHLOROTHIAZIDE 12.5 MG  TABS (HYDROCHLOROTHIAZIDE) 1 by mouth once daily  #90 x 3   Entered and Authorized by:   [name] MD   Signed by:   [name] MD on 09/11/2008   Method used:   Print then Give to Patient   RxID:   [id number] ZOCOR 40 MG  TABS (SIMVASTATIN) 1 by mouth once daily  #90 x 3   Entered and Authorized by:   [name] MD   Signed by:   [name] MD on 09/11/2008   Method used:   Print then Give to Patient   RxID:   [id number] TERAZOSIN HCL 2 MG  CAPS (TERAZOSIN HCL) 2 by mouth once daily  #180 x 3   Entered and Authorized by:   [name] MD   Signed by:   [name] MD on 09/11/2008   Method used:   Print then Give to Patient   RxID:   [id number]  ]  Orders Added: 1)  Est. Patient 65& > [42595] 2)  Hemoccult Guaiac-1 spec.(in office) [82272] 3)  EKG w/ Interpretation [93000] 4)  Flu Vaccine 68yrs + [90658] 5)  Admin of Therapeutic Inj (IM or [place]) [63875]    Influenza Vaccine    Vaccine Type: Fluvax 3+    Site: left deltoid    Given by: Alfred Levins, CMA  Flu Vaccine Consent Questions    Do you have a history of severe allergic reactions to this  vaccine? no    Any prior history of allergic reactions to egg and/or gelatin? no    Do you have a sensitivity to the preservative Thimersol? no    Do you have a past history of Guillan-Barre Syndrome? no    Do you currently have an acute febrile illness? no    Have you ever had a severe reaction to latex? no    Vaccine information given and explained to patient? yes

## 2011-01-25 NOTE — Letter (Signed)
Summary: Lipid Letter  [place] [facility]  [address] [place], [place] [address]   Phone: [phone]  Fax: [phone]    04/13/2009  [facility] [address] [place], [place]  [address]  Dear [name] [name]:  We have carefully reviewed your last lipid profile from 04/09/2009 and the results are noted below with a summary of recommendations for lipid management.    Cholesterol:       176     Goal: <   HDL "good" Cholesterol:   24.40     Goal: >   LDL "bad" Cholesterol:   79     Goal: <   Triglycerides:       76.0     Goal: <    Labs are normal.    TLC Diet (Therapeutic Lifestyle Change): Saturated Fats & Transfatty acids should be kept < 7% of total calories ***Reduce Saturated Fats Polyunstaurated Fat can be up to 10% of total calories Monounsaturated Fat Fat can be up to 20% of total calories Total Fat should be no greater than 25-35% of total calories Carbohydrates should be 50-60% of total calories Protein should be approximately 15% of total calories Fiber should be at least 20-30 grams a day ***Increased fiber may help lower LDL Total Cholesterol should be < 200mg /day Consider adding plant stanol/sterols to diet (example: Benacol spread) ***A higher intake of unsaturated fat may reduce Triglycerides and Increase HDL    Adjunctive Measures (may lower LIPIDS and reduce risk of Heart Attack) include: Aerobic Exercise (20-30 minutes 3-4 times a week) Limit Alcohol Consumption Weight Reduction Aspirin 75-81 mg a day by mouth (if not allergic or contraindicated) Dietary Fiber 20-30 grams a day by mouth  If you have any questions, please call. We appreciate being able to work with you.   Sincerely,     at Reginold Agent MD

## 2011-01-25 NOTE — Assessment & Plan Note (Signed)
Summary: bug bite-smm   Vital Signs:  Patient Profile:   76 Years Old Male Weight:      201 pounds Temp:     98.8 degrees F oral Pulse rate:   82 / minute BP sitting:   122 / 70  (left arm) Cuff size:   regular  Vitals Entered By: Alfred Levins, CMA (October 10, 2007 2:45 PM)                 Chief Complaint:  insect bite on cheek.  History of Present Illness: One week of itchy and burning red rash spot on left cheek. feels fine in general. Had fever blisters when he was younger.  Current Allergies: No known allergies   Past Medical History:    Reviewed history from 07/11/2007 and no changes required:       Allergic rhinitis       Hyperlipidemia       Hypertension     Review of Systems      See HPI   Physical Exam  General:     overweight-appearing.   Neck:     No deformities, masses, or tenderness noted. Lungs:     Normal respiratory effort, chest expands symmetrically. Lungs are clear to auscultation, no crackles or wheezes. Heart:     regular rhythm, no murmur, no gallop, no rub, and no JVD.  Rate is quite fast. EKG shows atrial flutter with 2:1 AV block, vent. rate 152. Has chronic RBBB. Abdomen:     Bowel sounds positive,abdomen soft and non-tender without masses, organomegaly or hernias noted. Skin:     left cheek has a 2 cm area of red scabbed dried areas which suggested a prior clusters of vessicles    Impression & Recommendations:  Problem # 1:  HERPES SIMPLEX INFECTION (ICD-054.9)  Complete Medication List: 1)  Aspir-81 81 Mg Tbec (Aspirin) .Marland Kitchen.. 1 by mouth once daily 2)  Terazosin Hcl 2 Mg Caps (Terazosin hcl) .... 2 by mouth once daily 3)  Zocor 40 Mg Tabs (Simvastatin) .Marland Kitchen.. 1 by mouth once daily 4)  Hydrochlorothiazide 12.5 Mg Tabs (Hydrochlorothiazide) .Marland Kitchen.. 1 by mouth once daily 5)  Cartia Xt 300 Mg Cp24 (Diltiazem hcl coated beads) .Marland Kitchen.. 1 by mouth once daily 6)  Lisinopril 40 Mg Tabs (Lisinopril) .Marland Kitchen.. 1 by mouth once daily  Other  Orders: EKG w/ Interpretation (93000)   Patient Instructions: 1)  has almost resolved on its own. Will simply observe. 2)  Please schedule a follow-up appointment as needed.    ]

## 2011-01-25 NOTE — Assessment & Plan Note (Signed)
Summary: shoulder pain/njr   Vital Signs:  Patient profile:   76 year old male Weight:      208 pounds BMI:     29.11 BP sitting:   136 / 90  (left arm) Cuff size:   regular  Vitals Entered By: Raechel Ache, RN (July 30, 2010 1:57 PM) CC: C/o pain both shoulders, neck and low back.   History of Present Illness: For 3 days he has had aching pains in the neck and bot shoulders. He had been using his string trimmer before that, and he is not sure if this could have caused it or not. He also thinks his Zocor may be causing his muscles to ache. using heat and Advil. No numbness or weakness in the arms.   Allergies (verified): No Known Drug Allergies  Past History:  Past Medical History: Reviewed history from 09/17/2009 and no changes required. Allergic rhinitis Hyperlipidemia Hypertension hyperglycemia ED sees Dr. Clydene Pugh for routine eye exams  Review of Systems  The patient denies anorexia, fever, weight loss, weight gain, vision loss, decreased hearing, hoarseness, chest pain, syncope, dyspnea on exertion, peripheral edema, prolonged cough, headaches, hemoptysis, abdominal pain, melena, hematochezia, severe indigestion/heartburn, hematuria, incontinence, genital sores, muscle weakness, suspicious skin lesions, transient blindness, difficulty walking, depression, unusual weight change, abnormal bleeding, enlarged lymph nodes, angioedema, breast masses, and testicular masses.    Physical Exam  General:  Well-developed,well-nourished,in no acute distress; alert,appropriate and cooperative throughout examination Neck:  No deformities, masses, or tenderness noted. Lungs:  Normal respiratory effort, chest expands symmetrically. Lungs are clear to auscultation, no crackles or wheezes. Heart:  Normal rate and regular rhythm. S1 and S2 normal without gallop, murmur, click, rub or other extra sounds. Msk:  No deformity or scoliosis noted of thoracic or lumbar spine.     Extremities:  No clubbing, cyanosis, edema, or deformity noted with normal full range of motion of all joints.     Impression & Recommendations:  Problem # 1:  SHOULDER PAIN, BILATERAL (ICD-719.41)  His updated medication list for this problem includes:    Aspir-81 81 Mg Tbec (Aspirin) .Marland Kitchen... 1 by mouth once daily    Diclofenac Sodium 50 Mg Tbec (Diclofenac sodium) .Marland Kitchen... Three times a day as needed pain  Complete Medication List: 1)  Aspir-81 81 Mg Tbec (Aspirin) .Marland Kitchen.. 1 by mouth once daily 2)  Terazosin Hcl 2 Mg Caps (Terazosin hcl) .... 2 by mouth once daily 3)  Zocor 40 Mg Tabs (Simvastatin) .Marland Kitchen.. 1 by mouth once daily 4)  Hydrochlorothiazide 12.5 Mg Tabs (Hydrochlorothiazide) .Marland Kitchen.. 1 by mouth once daily 5)  Cartia Xt 300 Mg Cp24 (Diltiazem hcl coated beads) .Marland Kitchen.. 1 by mouth once daily 6)  Zyrtec Allergy 10 Mg Tabs (Cetirizine hcl) .Marland Kitchen.. 1 by mouth once daily 7)  Glucosamine-chondroitin 250-200 Mg Tabs (Glucosamine-chondroitin) .Marland Kitchen.. 1 by mouth once daily 8)  Diclofenac Sodium 50 Mg Tbec (Diclofenac sodium) .... Three times a day as needed pain  Patient Instructions: 1)  Try Diclofenac, stop the Zocor for a month to see what happens.  2)  Please schedule a follow-up appointment as needed .  Prescriptions: DICLOFENAC SODIUM 50 MG TBEC (DICLOFENAC SODIUM) three times a day as needed pain  #90 x 5   Entered and Authorized by:   [name] MD   Signed by:   [name] MD on 07/30/2010   Method used:   Electronically to        [facility]. [id number] (  retail)       [phone]       [place], [place]  [address]       Ph: [phone]       Fax: [phone]   RxID:   [id number]

## 2011-01-25 NOTE — Assessment & Plan Note (Signed)
Summary: cpx/njr   Vital Signs:  Patient profile:   76 year old male Height:      71 inches Weight:      202 pounds BMI:     28.28 Temp:     97.8 degrees F oral Pulse rate:   105 / minute BP sitting:   132 / 72  (left arm) Cuff size:   large  Vitals Entered By: Alfred Levins, CMA (September 17, 2009 10:04 AM) CC: cpx   History of Present Illness: 76 yr old male for cpx. He feels great and has no concerns. He remains active and enjoys riding his motorcycle with friends.   Allergies (verified): No Known Drug Allergies  Past History:  Past Medical History: Allergic rhinitis Hyperlipidemia Hypertension hyperglycemia ED sees Dr. Clydene Pugh for routine eye exams  Past Surgical History: Reviewed history from 09/11/2008 and no changes required. Surgical repair of right inguinal hernia Arthoscopy on right knee Sinus surgery colonoscopy 2005 normal, per Dr. Jarold Motto (next in 7 yrs) Cataract extraction bilaterally per Dr. Jettie Pagan  Family History: Reviewed history from 10/16/2007 and no changes required. Family History Hypertension Family History Lung cancer  Social History: Reviewed history from 10/16/2007 and no changes required. Retired Married Never Smoked Alcohol use-no  Review of Systems  The patient denies anorexia, fever, weight loss, weight gain, vision loss, decreased hearing, hoarseness, chest pain, syncope, dyspnea on exertion, peripheral edema, prolonged cough, headaches, hemoptysis, abdominal pain, melena, hematochezia, severe indigestion/heartburn, hematuria, incontinence, genital sores, muscle weakness, suspicious skin lesions, transient blindness, difficulty walking, depression, unusual weight change, abnormal bleeding, enlarged lymph nodes, angioedema, breast masses, and testicular masses.    Physical Exam  General:  Well-developed,well-nourished,in no acute distress; alert,appropriate and cooperative throughout examination Head:  Normocephalic and  atraumatic without obvious abnormalities. No apparent alopecia or balding. Eyes:  No corneal or conjunctival inflammation noted. EOMI. Perrla. Funduscopic exam benign, without hemorrhages, exudates or papilledema. Vision grossly normal. Ears:  External ear exam shows no significant lesions or deformities.  Otoscopic examination reveals clear canals, tympanic membranes are intact bilaterally without bulging, retraction, inflammation or discharge. Hearing is grossly normal bilaterally. Nose:  External nasal examination shows no deformity or inflammation. Nasal mucosa are pink and moist without lesions or exudates. Mouth:  Oral mucosa and oropharynx without lesions or exudates.  Teeth in good repair. Neck:  No deformities, masses, or tenderness noted. Chest Wall:  No deformities, masses, tenderness or gynecomastia noted. Lungs:  Normal respiratory effort, chest expands symmetrically. Lungs are clear to auscultation, no crackles or wheezes. Heart:  Normal rate and regular rhythm. S1 and S2 normal without gallop, murmur, click, rub or other extra sounds. EKG normal Abdomen:  Bowel sounds positive,abdomen soft and non-tender without masses, organomegaly or hernias noted. Rectal:  No external abnormalities noted. Normal sphincter tone. No rectal masses or tenderness. Heme neg Genitalia:  Testes bilaterally descended without nodularity, tenderness or masses. No scrotal masses or lesions. No penis lesions or urethral discharge. Prostate:  no nodules, no asymmetry, and 1+ enlarged.   Msk:  No deformity or scoliosis noted of thoracic or lumbar spine.   Pulses:  R and L carotid,radial,femoral,dorsalis pedis and posterior tibial pulses are full and equal bilaterally Extremities:  No clubbing, cyanosis, edema, or deformity noted with normal full range of motion of all joints.   Neurologic:  No cranial nerve deficits noted. Station and gait are normal. Plantar reflexes are down-going bilaterally. DTRs are  symmetrical throughout. Sensory, motor and coordinative functions appear intact. Skin:  Intact  without suspicious lesions or rashes Cervical Nodes:  No lymphadenopathy noted Axillary Nodes:  No palpable lymphadenopathy Inguinal Nodes:  No significant adenopathy Psych:  Cognition and judgment appear intact. Alert and cooperative with normal attention span and concentration. No apparent delusions, illusions, hallucinations   Impression & Recommendations:  Problem # 1:  EXAMINATION, ROUTINE MEDICAL (ICD-V70.0)  Orders: Hemoccult Guaiac-1 spec.(in office) (82270) EKG w/ Interpretation (93000)  Complete Medication List: 1)  Aspir-81 81 Mg Tbec (Aspirin) .Marland Kitchen.. 1 by mouth once daily 2)  Terazosin Hcl 2 Mg Caps (Terazosin hcl) .... 2 by mouth once daily 3)  Zocor 40 Mg Tabs (Simvastatin) .Marland Kitchen.. 1 by mouth once daily 4)  Hydrochlorothiazide 12.5 Mg Tabs (Hydrochlorothiazide) .Marland Kitchen.. 1 by mouth once daily 5)  Cartia Xt 300 Mg Cp24 (Diltiazem hcl coated beads) .Marland Kitchen.. 1 by mouth once daily 6)  Zyrtec Allergy 10 Mg Tabs (Cetirizine hcl) .Marland Kitchen.. 1 by mouth once daily 7)  Glucosamine-chondroitin 250-200 Mg Tabs (Glucosamine-chondroitin) .Marland Kitchen.. 1 by mouth once daily  Patient Instructions: 1)  Please schedule a follow-up appointment in 1 year.  Prescriptions: CARTIA XT 300 MG  CP24 (DILTIAZEM HCL COATED BEADS) 1 by mouth once daily  #90 x 3   Entered and Authorized by:   [name] MD   Signed by:   [name] MD on 09/17/2009   Method used:   Print then Give to Patient   RxID:   [id number] HYDROCHLOROTHIAZIDE 12.5 MG  TABS (HYDROCHLOROTHIAZIDE) 1 by mouth once daily  #90 x 3   Entered and Authorized by:   [name] MD   Signed by:   [name] MD on 09/17/2009   Method used:   Print then Give to Patient   RxID:   [id number] ZOCOR 40 MG  TABS (SIMVASTATIN) 1 by mouth once daily  #90 x 3   Entered and Authorized by:   [name] MD   Signed by:   [name] MD on 09/17/2009    Method used:   Print then Give to Patient   RxID:   [id number] TERAZOSIN HCL 2 MG  CAPS (TERAZOSIN HCL) 2 by mouth once daily  #180 x 3   Entered and Authorized by:   [name] MD   Signed by:   [name] MD on 09/17/2009   Method used:   Print then Give to Patient   RxID:   [id number]   Appended Document: cpx/njr    Nurse Visit   Allergies: No Known Drug Allergies  Orders Added: 1)  Admin 1st Vaccine [90471] 2)  Flu Vaccine 71yrs + [18841] Flu Vaccine Consent Questions     Do you have a history of severe allergic reactions to this vaccine? no    Any prior history of allergic reactions to egg and/or gelatin? no    Do you have a sensitivity to the preservative Thimersol? no    Do you have a past history of Guillan-Barre Syndrome? no    Do you currently have an acute febrile illness? no    Have you ever had a severe reaction to latex? no    Vaccine information given and explained to patient? yes    Are you currently pregnant? no    Lot Number:[id number]   Exp Date:06/24/2010   Site Given  Left Deltoid IM]  .[email or web address]

## 2011-01-25 NOTE — Progress Notes (Signed)
Summary: question about med  Phone Note Call from Patient Call back at Home Phone [phone] Call back at [phone]   Caller: Patient Summary of Call: wants to know if he can start back on otc meds Initial call taken by: Alfred Levins, CMA,  August 20, 2007 5:58 PM  Follow-up for Phone Call        no, I would like him to stay off these high dose drugs Follow-up by: [name] MD,  August 21, 2007 9:01 AM  Additional Follow-up for Phone Call Additional follow up Details #1::        Phone Call Completed Additional Follow-up by: Alfred Levins, CMA,  August 21, 2007 10:45 AM

## 2011-01-27 NOTE — Progress Notes (Signed)
Summary: LABWORK NEEDED?  Phone Note Call from Patient   Caller: Patient Summary of Call: Pt called to adv that he was supposed to have labwork done in December 2011..... Can you advise if there is any other labwork you want ordered besides lipid profile?  Initial call taken by: Debbra Riding,  December 06, 2010 10:42 AM  Follow-up for Phone Call        he needs a lipid panel and liver panel for 272.4  Follow-up by: [name] MD,  December 07, 2010 8:50 AM  Additional Follow-up for Phone Call Additional follow up Details #1::        Lab appt scheduled for 01/07/11  at  9:30am.  Additional Follow-up by: Debbra Riding,  December 07, 2010 9:36 AM

## 2011-02-28 ENCOUNTER — Encounter (INDEPENDENT_AMBULATORY_CARE_PROVIDER_SITE_OTHER): Payer: Self-pay | Admitting: *Deleted

## 2011-03-08 NOTE — Letter (Signed)
Summary: Pre Visit Letter Revised  [facility]  [address] [place], [place] [address]   Phone: [phone]  Fax: [phone]        02/28/2011 MRN: [id number] [facility] [address] [place], [place]  [address]             Procedure Date:  04/04/2011 @ 8:30   Direct colon-Dr. Jarold Motto   Welcome to the Gastroenterology Division at [facility].    You are scheduled to see a nurse for your pre-procedure visit on 03/21/2011 at 9:00 on the 3rd floor at [facility], [address]  We ask that you try to arrive at our office 15 minutes prior to your appointment time to allow for check-in.  Please take a minute to review the attached form.  If you answer "Yes" to one or more of the questions on the first page, we ask that you call the person listed at your earliest opportunity.  If you answer "No" to all of the questions, please complete the rest of the form and bring it to your appointment.    Your nurse visit will consist of discussing your medical and surgical history, your immediate family medical history, and your medications.   If you are unable to list all of your medications on the form, please bring the medication bottles to your appointment and we will list them.  We will need to be aware of both prescribed and over the counter drugs.  We will need to know exact dosage information as well.    Please be prepared to read and sign documents such as consent forms, a financial agreement, and acknowledgement forms.  If necessary, and with your consent, a friend or relative is welcome to sit-in on the nurse visit with you.  Please bring your insurance card so that we may make a copy of it.  If your insurance requires a referral to see a specialist, please bring your referral form from your primary care physician.  No co-pay is required for this nurse visit.     If you cannot keep your appointment, please call [phone] to cancel or reschedule prior to  your appointment date.  This allows [place] the opportunity to schedule an appointment for another patient in need of care.    Thank you for choosing [facility] for your medical needs.  We appreciate the opportunity to care for you.  Please visit [place] at our website  to learn more about our practice.  Sincerely, The Gastroenterology Division

## 2011-03-21 ENCOUNTER — Ambulatory Visit (AMBULATORY_SURGERY_CENTER): Payer: Medicare Other | Admitting: *Deleted

## 2011-03-21 VITALS — Ht 73.0 in | Wt 205.2 lb

## 2011-03-21 DIAGNOSIS — Z1211 Encounter for screening for malignant neoplasm of colon: Secondary | ICD-10-CM

## 2011-03-21 MED ORDER — PEG-KCL-NACL-NASULF-NA ASC-C 100 G PO SOLR: 1.0000 | Freq: Once | ORAL | Status: AC

## 2011-03-31 ENCOUNTER — Encounter: Payer: Self-pay | Admitting: *Deleted

## 2011-04-04 ENCOUNTER — Encounter: Payer: Self-pay | Admitting: Gastroenterology

## 2011-04-04 ENCOUNTER — Ambulatory Visit (AMBULATORY_SURGERY_CENTER): Payer: Medicare Other | Admitting: Gastroenterology

## 2011-04-04 VITALS — BP 140/87 | HR 78 | Temp 97.3°F | Resp 18

## 2011-04-04 DIAGNOSIS — Z1211 Encounter for screening for malignant neoplasm of colon: Secondary | ICD-10-CM

## 2011-04-04 DIAGNOSIS — K5730 Diverticulosis of large intestine without perforation or abscess without bleeding: Secondary | ICD-10-CM

## 2011-04-04 HISTORY — PX: COLONOSCOPY: SHX174

## 2011-04-04 LAB — HM COLONOSCOPY

## 2011-04-04 MED ORDER — SODIUM CHLORIDE 0.9 % IV SOLN: 500.0000 mL | INTRAVENOUS | Status: DC

## 2011-04-04 NOTE — Patient Instructions (Signed)
Discharge instructions given with verbal understanding. Handout on Diverticulosis given. 

## 2011-04-05 ENCOUNTER — Telehealth: Payer: Self-pay | Admitting: *Deleted

## 2011-04-05 NOTE — Telephone Encounter (Signed)

## 2011-05-13 NOTE — Assessment & Plan Note (Signed)
[facility] OFFICE NOTE   NAME:[name] [name], [name] [name]                     MRN:          [id number]  DATE:09/05/2006                            DOB:          01/12/1935    This is a 76 year old gentleman here for a complete physical examination.  In general, he feels good and has no complaints at all.  He does have  bilateral cataracts and has been seeing Dr. Emmit [name].  Apparently, they are  thinking about surgery sometime this year.  He continues to be active and  walks 3-4 miles a day.  For details of his past medical history, family  history, social history, habits, [name] [name], I refer you to our last physical  note dated September 07, 2005.   ALLERGIES:  NONE.   CURRENT MEDICATIONS:  1. Terazosin 2 mg 2 capsules a day.  2. Aspirin 81 mg per day.  3. Zocor 40 mg per day.  4. HCTZ 12.5 mg a day.  5. Diltiazem XT 300 mg per day.  6. Lisinopril 40 mg per day.   OBJECTIVE:  VITAL SIGNS:  Weight 214, pulse 60 and regular, blood pressure  120/80.  GENERAL:  He remains a little overweight.  SKIN:  Free of significant lesions.  EYES:  Sclerae are clear.  PHARYNX:  Clear.  NECK:  Supple without lymphadenopathy or masses.  LUNGS:  Clear.  CARDIAC:  Rate and rhythm are regular without gallops, murmurs, or rubs.  Distal pulses are full. EKG is within normal limits.  ABDOMEN:  Soft, normal bowel sounds, nontender.  No masses.  GENITALIA:  Normal male.  RECTAL EXAM:  No mass or tenderness.  Prostate is within normal limits.  Stool:  Hemoccult negative.  EXTREMITIES:  No clubbing, cyanosis, or edema.  NEUROLOGIC EXAM:  Grossly intact.   He is here for fasting labs on September 4; these were all within normal,  including an LDL of 76.   ASSESSMENT AND PLAN:  1. Complete physical exam:  We will repeat this in a year.  2. Hypertension:  Stable.  3. Hyperlipidemia:  Stable.  4. Cataracts:  He will follow up with Dr.  Emmit [name].                                   [name] [name]. [name] Ridges, MD   SAF/MedQ  DD:  09/05/2006  DT:  09/06/2006  Job #:  [id number]

## 2011-05-13 NOTE — Op Note (Signed)
[facility]  Patient:    [name] [name], [name] [name] Visit Number: [id number] MRN: [id number]          Service Type: DSU Location: DAY Attending Physician:  Marlowe Kays Page Dictated by:   Illene Labrador. Aplington, M.D. Proc. Date: 03/12/02 Admit Date:  03/12/2002                             Operative Report  PREOPERATIVE DIAGNOSES: 1. Torn medial meniscus. 2. Chondromalacia.  POSTOPERATIVE DIAGNOSES: 1. Torn medial and lateral menisci. 2. Grade 2 out of 4 chondromalacia, mediofemoral condyle. 3. Extensive chondrocalcinosis, right knee.  OPERATION:  Right knee arthroscopy with (1) partial midline meniscectomy and (2) shaving mediofemoral condyle.  SURGEON:  Illene Labrador. Aplington, M.D.  ASSISTANT:  Nurse.  ANESTHESIA:  General.  PATHOLOGY AND JUSTIFICATION FOR PROCEDURE:  He has had pain mainly in the inner aspect of the right knee progressive since mid January associated with some swelling. X-rays have shown some chondrocalcinosis. MRIs demonstrated a posterior horn tear of the medial meniscus associated with some tricompartmental arthritis. At surgery, he had extensive tear of the entire posterior 40% of the medial meniscus extending from just proximal to the curve posteriorly all the way into the intercondylar area. This was associated with grade 2 out of 4 chondromalacia of the mediofemoral condyle. He had some mild wear of the patella which did not require shaving. ACL was intact. Laterally, he had extensive tearing of most of the lateral meniscus but particularly the anterior portion going to the posterior curve as well as the intercondylar area with calcium depositions in both the intercondylar area and beneath the anterior horn of the lateral meniscus.  DESCRIPTION OF PROCEDURE:  Satisfactory general anesthesia. Pneumatic tourniquet. Thigh stabilizer. Right knee was prepped with DuraPrep and draped in sterile field. Superomedial sealing inflow.  First, the anterolateral port of the medial compartment of knee joint was evaluated. There was some modest synovitis in the anterior compartment which I had to resect for visualization purposes and I also felt this was a pathologic entity. The chondromalacia on the mediofemoral condyle was depicted; it involved most of the weightbearing surface and was gently smoothed down with a 3.5 shaver. The extensive posterior horn tear of the medial meniscus was identified and resected back to a stable rim with a variety of baskets and then shaved down until smooth with 3.5 shaver. The final remaining meniscus was stable on probing. I then looked up the medial gutter and suprapatellar area. The mild patellar wear did not require shaving; no other abnormalities noted. I then reversed portals; a good bit of calcium beneath the anterior horn of the lateral meniscus and some synovitis. I resected the synovium for better visualization and probed out the calcium. I then looked at the lateral meniscus and found it to have significant tearing all the way back to the posterior curve and in the intercondylar area was some additional calcium which basically was eroding away the meniscus. I trimmed back the meniscus to stable rim with baskets and then shaved it down until smooth with 3.5 shaver. Pre and post films were taken. Looking at the lateral gutter, suprapatellar, no other abnormalities were noted. The knee joint was then irrigated until clear with an extensive amount of both meniscus and possibly some calcium deposition removed. The knee joint was then evacuated of all fluid. The two anterior portals were closed with 4-0 nylon. Marcaine 0.5% with adrenalin, 20 cc,  and 4 mg of morphine were then instilled through the inflow apparatus which was removed and _______ as well. Betadine, Adaptic, dry sterile dressing were applied. Tourniquet was released. He tolerated the procedure well. At the time of this  dictation, he was on his way to recovery in satisfactory condition with no known complications. Dictated by:   Illene Labrador. Aplington, M.D. Attending Physician:  Joaquin Courts DD:  03/12/02 TD:  03/12/02 Job: [id number]
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
## 2011-05-18 ENCOUNTER — Ambulatory Visit (INDEPENDENT_AMBULATORY_CARE_PROVIDER_SITE_OTHER): Payer: Medicare Other | Admitting: Family Medicine

## 2011-05-18 ENCOUNTER — Encounter: Payer: Self-pay | Admitting: Family Medicine

## 2011-05-18 VITALS — BP 144/78 | HR 82 | Temp 98.2°F | Resp 16 | Wt 210.0 lb

## 2011-05-18 DIAGNOSIS — E785 Hyperlipidemia, unspecified: Secondary | ICD-10-CM

## 2011-05-18 DIAGNOSIS — I10 Essential (primary) hypertension: Secondary | ICD-10-CM

## 2011-05-18 LAB — HEPATIC FUNCTION PANEL
ALT: 23 U/L (ref 0–53)
AST: 26 U/L (ref 0–37)
Albumin: 4.2 g/dL (ref 3.5–5.2)
Alkaline Phosphatase: 38 U/L — ABNORMAL LOW (ref 39–117)
Bilirubin, Direct: 0.1 mg/dL (ref 0.0–0.3)
Total Bilirubin: 0.8 mg/dL (ref 0.3–1.2)
Total Protein: 6.4 g/dL (ref 6.0–8.3)

## 2011-05-18 LAB — LIPID PANEL
Cholesterol: 152 mg/dL (ref 0–200)
HDL: 83.7 mg/dL (ref >39.00)
LDL Cholesterol: 52 mg/dL (ref 0–99)
Total CHOL/HDL Ratio: 2
Triglycerides: 80 mg/dL (ref 0.0–149.0)
VLDL: 16 mg/dL (ref 0.0–40.0)

## 2011-05-18 NOTE — Progress Notes (Signed)
  Subjective:    Patient ID: [name] [name], male    DOB: [birth year], 76 y.o.   MRN: [id number]  HPI Here to recheck BP and lipids. He is fasting. He feels fine with no complaints. He has put on 10 lbs over the winter.    Review of Systems  Constitutional: Negative.   Respiratory: Negative.   Cardiovascular: Negative.        Objective:   Physical Exam  Constitutional: He appears well-developed and well-nourished.  Neck: No thyromegaly present.  Cardiovascular: Normal rate, regular rhythm, normal heart sounds and intact distal pulses.   Pulmonary/Chest: Effort normal and breath sounds normal.  Lymphadenopathy:    He has no cervical adenopathy.          Assessment & Plan:  We discussed diet and exercise. Get labs

## 2011-05-24 ENCOUNTER — Telehealth: Payer: Self-pay | Admitting: *Deleted

## 2011-05-24 NOTE — Telephone Encounter (Signed)
LMOM to inform Pt. 

## 2011-05-24 NOTE — Telephone Encounter (Signed)
Message copied by Regis Bill on Tue May 24, 2011  2:16 PM ------      Message from: Gershon Crane A      Created: Tue May 24, 2011 10:26 AM       normal

## 2011-08-12 ENCOUNTER — Other Ambulatory Visit: Payer: Self-pay | Admitting: Family Medicine

## 2011-09-02 ENCOUNTER — Other Ambulatory Visit: Payer: Self-pay | Admitting: Family Medicine

## 2011-10-11 ENCOUNTER — Other Ambulatory Visit (INDEPENDENT_AMBULATORY_CARE_PROVIDER_SITE_OTHER): Payer: Medicare Other

## 2011-10-11 DIAGNOSIS — Z0000 Encounter for general adult medical examination without abnormal findings: Secondary | ICD-10-CM

## 2011-10-11 DIAGNOSIS — Z79899 Other long term (current) drug therapy: Secondary | ICD-10-CM

## 2011-10-11 DIAGNOSIS — Z125 Encounter for screening for malignant neoplasm of prostate: Secondary | ICD-10-CM

## 2011-10-11 LAB — CBC WITH DIFFERENTIAL/PLATELET
Basophils Absolute: 0 10*3/uL (ref 0.0–0.1)
Basophils Relative: 0.6 % (ref 0.0–3.0)
Eosinophils Absolute: 0.2 10*3/uL (ref 0.0–0.7)
Eosinophils Relative: 6.6 % — ABNORMAL HIGH (ref 0.0–5.0)
HCT: 43.4 % (ref 39.0–52.0)
Hemoglobin: 14.3 g/dL (ref 13.0–17.0)
Lymphocytes Relative: 37.9 % (ref 12.0–46.0)
Lymphs Abs: 1.3 10*3/uL (ref 0.7–4.0)
MCHC: 32.9 g/dL (ref 30.0–36.0)
MCV: 83.5 fl (ref 78.0–100.0)
Monocytes Absolute: 0.4 10*3/uL (ref 0.1–1.0)
Monocytes Relative: 10.6 % (ref 3.0–12.0)
Neutro Abs: 1.5 10*3/uL (ref 1.4–7.7)
Neutrophils Relative %: 44.3 % (ref 43.0–77.0)
Platelets: 189 10*3/uL (ref 150.0–400.0)
RBC: 5.19 Mil/uL (ref 4.22–5.81)
RDW: 14.8 % — ABNORMAL HIGH (ref 11.5–14.6)
WBC: 3.5 10*3/uL — ABNORMAL LOW (ref 4.5–10.5)

## 2011-10-11 LAB — BASIC METABOLIC PANEL
BUN: 12 mg/dL (ref 6–23)
CO2: 29 mEq/L (ref 19–32)
Calcium: 9.6 mg/dL (ref 8.4–10.5)
Chloride: 105 mEq/L (ref 96–112)
Creatinine, Ser: 1.1 mg/dL (ref 0.4–1.5)
GFR: 84.14 mL/min (ref >60.00)
Glucose, Bld: 120 mg/dL — ABNORMAL HIGH (ref 70–99)
Potassium: 4.2 mEq/L (ref 3.5–5.1)
Sodium: 141 mEq/L (ref 135–145)

## 2011-10-11 LAB — POCT URINALYSIS DIPSTICK
Bilirubin, UA: NEGATIVE
Blood, UA: NEGATIVE
Glucose, UA: NEGATIVE
Leukocytes, UA: NEGATIVE
Nitrite, UA: NEGATIVE
Spec Grav, UA: 1.015
Urobilinogen, UA: 0.2
pH, UA: 7

## 2011-10-11 LAB — PSA: PSA: 0.71 ng/mL (ref 0.10–4.00)

## 2011-10-11 LAB — HEPATIC FUNCTION PANEL
ALT: 34 U/L (ref 0–53)
AST: 34 U/L (ref 0–37)
Albumin: 4.6 g/dL (ref 3.5–5.2)
Alkaline Phosphatase: 44 U/L (ref 39–117)
Bilirubin, Direct: 0.2 mg/dL (ref 0.0–0.3)
Total Bilirubin: 1.1 mg/dL (ref 0.3–1.2)
Total Protein: 6.9 g/dL (ref 6.0–8.3)

## 2011-10-11 LAB — LIPID PANEL
Cholesterol: 172 mg/dL (ref 0–200)
HDL: 92.9 mg/dL (ref >39.00)
LDL Cholesterol: 68 mg/dL (ref 0–99)
Total CHOL/HDL Ratio: 2
Triglycerides: 55 mg/dL (ref 0.0–149.0)
VLDL: 11 mg/dL (ref 0.0–40.0)

## 2011-10-11 LAB — TSH: TSH: 1.61 u[IU]/mL (ref 0.35–5.50)

## 2011-10-13 ENCOUNTER — Encounter: Payer: Self-pay | Admitting: Family Medicine

## 2011-10-13 ENCOUNTER — Telehealth: Payer: Self-pay | Admitting: Family Medicine

## 2011-10-13 NOTE — Telephone Encounter (Signed)
Message copied by Baldemar Friday on Thu Oct 13, 2011  3:52 PM ------      Message from: Gershon Crane A      Created: Thu Oct 13, 2011  1:13 PM       Normal except elevated glucose. Watch the diet

## 2011-10-13 NOTE — Telephone Encounter (Signed)
Spoke with pt and gave results and put a copy in mail.

## 2011-10-17 ENCOUNTER — Encounter: Payer: Self-pay | Admitting: Family Medicine

## 2011-10-18 ENCOUNTER — Ambulatory Visit (INDEPENDENT_AMBULATORY_CARE_PROVIDER_SITE_OTHER): Payer: Medicare Other | Admitting: Family Medicine

## 2011-10-18 ENCOUNTER — Encounter: Payer: Self-pay | Admitting: Family Medicine

## 2011-10-18 VITALS — BP 122/84 | HR 77 | Temp 98.4°F | Ht 70.5 in | Wt 206.0 lb

## 2011-10-18 DIAGNOSIS — Z23 Encounter for immunization: Secondary | ICD-10-CM

## 2011-10-18 DIAGNOSIS — Z136 Encounter for screening for cardiovascular disorders: Secondary | ICD-10-CM

## 2011-10-18 DIAGNOSIS — Z0000 Encounter for general adult medical examination without abnormal findings: Secondary | ICD-10-CM

## 2011-10-18 MED ORDER — PNEUMOCOCCAL VAC POLYVALENT 25 MCG/0.5ML IJ INJ: 0.5000 mL | INJECTION | Freq: Once | INTRAMUSCULAR | Status: DC

## 2011-10-18 MED ORDER — SILDENAFIL CITRATE 100 MG PO TABS: 100.0000 mg | ORAL_TABLET | ORAL | Status: DC | PRN

## 2011-10-18 NOTE — Progress Notes (Signed)
  Subjective:    Patient ID: [name] [name], male    DOB: [birth year], 76 y.o.   MRN: [id number]  HPI 76 yr old male for a cpx. He feels fine and has no concerns. He exercises and watches his diet closely.    Review of Systems  Constitutional: Negative.   HENT: Negative.   Eyes: Negative.   Respiratory: Negative.   Cardiovascular: Negative.   Gastrointestinal: Negative.   Genitourinary: Negative.   Musculoskeletal: Negative.   Skin: Negative.   Neurological: Negative.   Hematological: Negative.   Psychiatric/Behavioral: Negative.        Objective:   Physical Exam  Constitutional: He is oriented to person, place, and time. He appears well-developed and well-nourished. No distress.  HENT:  Head: Normocephalic and atraumatic.  Right Ear: External ear normal.  Left Ear: External ear normal.  Nose: Nose normal.  Mouth/Throat: Oropharynx is clear and moist. No oropharyngeal exudate.  Eyes: Conjunctivae and EOM are normal. Pupils are equal, round, and reactive to light. Right eye exhibits no discharge. Left eye exhibits no discharge. No scleral icterus.  Neck: Neck supple. No JVD present. No tracheal deviation present. No thyromegaly present.  Cardiovascular: Normal rate, regular rhythm, normal heart sounds and intact distal pulses.  Exam reveals no gallop and no friction rub.   No murmur heard.      EKG normal with stable RBBB  Pulmonary/Chest: Effort normal and breath sounds normal. No respiratory distress. He has no wheezes. He has no rales. He exhibits no tenderness.  Abdominal: Soft. Bowel sounds are normal. He exhibits no distension and no mass. There is no tenderness. There is no rebound and no guarding.  Genitourinary: Rectum normal, prostate normal and penis normal. Guaiac negative stool. No penile tenderness.  Musculoskeletal: Normal range of motion. He exhibits no edema and no tenderness.  Lymphadenopathy:    He has no cervical adenopathy.  Neurological: He is alert and  oriented to person, place, and time. He has normal reflexes. No cranial nerve deficit. He exhibits normal muscle tone. Coordination normal.  Skin: Skin is warm and dry. No rash noted. He is not diaphoretic. No erythema. No pallor.  Psychiatric: He has a normal mood and affect. His behavior is normal. Judgment and thought content normal.          Assessment & Plan:  Well exam. Given flu shot and Pneumovax

## 2012-04-15 ENCOUNTER — Other Ambulatory Visit: Payer: Self-pay | Admitting: Family Medicine

## 2012-05-06 ENCOUNTER — Other Ambulatory Visit: Payer: Self-pay | Admitting: Family Medicine

## 2012-05-25 ENCOUNTER — Emergency Department (HOSPITAL_COMMUNITY): Payer: Medicare Other

## 2012-05-25 ENCOUNTER — Inpatient Hospital Stay (HOSPITAL_COMMUNITY)
Admission: EM | Admit: 2012-05-25 | Discharge: 2012-05-28 | DRG: 378 | Disposition: A | Discharge status: Home / Self Care | Payer: Medicare Other | Attending: Family Medicine | Admitting: Family Medicine

## 2012-05-25 ENCOUNTER — Encounter (HOSPITAL_COMMUNITY): Payer: Self-pay | Admitting: Emergency Medicine

## 2012-05-25 DIAGNOSIS — N2889 Other specified disorders of kidney and ureter: Secondary | ICD-10-CM

## 2012-05-25 DIAGNOSIS — Z0000 Encounter for general adult medical examination without abnormal findings: Secondary | ICD-10-CM

## 2012-05-25 DIAGNOSIS — I10 Essential (primary) hypertension: Secondary | ICD-10-CM | POA: Diagnosis present

## 2012-05-25 DIAGNOSIS — I479 Paroxysmal tachycardia, unspecified: Secondary | ICD-10-CM

## 2012-05-25 DIAGNOSIS — M25519 Pain in unspecified shoulder: Secondary | ICD-10-CM

## 2012-05-25 DIAGNOSIS — J309 Allergic rhinitis, unspecified: Secondary | ICD-10-CM

## 2012-05-25 DIAGNOSIS — D721 Eosinophilia, unspecified: Secondary | ICD-10-CM

## 2012-05-25 DIAGNOSIS — B009 Herpesviral infection, unspecified: Secondary | ICD-10-CM

## 2012-05-25 DIAGNOSIS — D62 Acute posthemorrhagic anemia: Secondary | ICD-10-CM | POA: Diagnosis present

## 2012-05-25 DIAGNOSIS — K5731 Diverticulosis of large intestine without perforation or abscess with bleeding: Principal | ICD-10-CM | POA: Diagnosis present

## 2012-05-25 DIAGNOSIS — N289 Disorder of kidney and ureter, unspecified: Secondary | ICD-10-CM | POA: Diagnosis present

## 2012-05-25 DIAGNOSIS — E785 Hyperlipidemia, unspecified: Secondary | ICD-10-CM | POA: Diagnosis present

## 2012-05-25 DIAGNOSIS — K922 Gastrointestinal hemorrhage, unspecified: Secondary | ICD-10-CM

## 2012-05-25 LAB — DIFFERENTIAL
Basophils Absolute: 0 10*3/uL (ref 0.0–0.1)
Basophils Relative: 1 % (ref 0–1)
Eosinophils Absolute: 0.1 10*3/uL (ref 0.0–0.7)
Eosinophils Relative: 2 % (ref 0–5)
Lymphocytes Relative: 17 % (ref 12–46)
Lymphs Abs: 0.9 10*3/uL (ref 0.7–4.0)
Monocytes Absolute: 0.5 10*3/uL (ref 0.1–1.0)
Monocytes Relative: 8 % (ref 3–12)
Neutro Abs: 3.9 10*3/uL (ref 1.7–7.7)
Neutrophils Relative %: 72 % (ref 43–77)

## 2012-05-25 LAB — COMPREHENSIVE METABOLIC PANEL
ALT: 24 U/L (ref 0–53)
AST: 34 U/L (ref 0–37)
Albumin: 4.4 g/dL (ref 3.5–5.2)
Alkaline Phosphatase: 45 U/L (ref 39–117)
BUN: 20 mg/dL (ref 6–23)
CO2: 24 mEq/L (ref 19–32)
Calcium: 9.2 mg/dL (ref 8.4–10.5)
Chloride: 105 mEq/L (ref 96–112)
Creatinine, Ser: 1.08 mg/dL (ref 0.50–1.35)
GFR calc Af Amer: 76 mL/min — ABNORMAL LOW (ref >90)
GFR calc non Af Amer: 66 mL/min — ABNORMAL LOW (ref >90)
Glucose, Bld: 124 mg/dL — ABNORMAL HIGH (ref 70–99)
Potassium: 3.7 mEq/L (ref 3.5–5.1)
Sodium: 142 mEq/L (ref 135–145)
Total Bilirubin: 0.5 mg/dL (ref 0.3–1.2)
Total Protein: 6.8 g/dL (ref 6.0–8.3)

## 2012-05-25 LAB — URINALYSIS, ROUTINE W REFLEX MICROSCOPIC
Bilirubin Urine: NEGATIVE
Glucose, UA: NEGATIVE mg/dL
Hgb urine dipstick: NEGATIVE
Ketones, ur: >80 mg/dL — AB
Leukocytes, UA: NEGATIVE
Nitrite: NEGATIVE
Protein, ur: NEGATIVE mg/dL
Specific Gravity, Urine: 1.033 — ABNORMAL HIGH (ref 1.005–1.030)
Urobilinogen, UA: 1 mg/dL (ref 0.0–1.0)
pH: 5.5 (ref 5.0–8.0)

## 2012-05-25 LAB — APTT: aPTT: 28 seconds (ref 24–37)

## 2012-05-25 LAB — CBC
HCT: 38.5 % — ABNORMAL LOW (ref 39.0–52.0)
Hemoglobin: 12.3 g/dL — ABNORMAL LOW (ref 13.0–17.0)
MCH: 26.7 pg (ref 26.0–34.0)
MCHC: 31.9 g/dL (ref 30.0–36.0)
MCV: 83.7 fL (ref 78.0–100.0)
Platelets: 185 10*3/uL (ref 150–400)
RBC: 4.6 MIL/uL (ref 4.22–5.81)
RDW: 13.9 % (ref 11.5–15.5)
WBC: 5.5 10*3/uL (ref 4.0–10.5)

## 2012-05-25 LAB — PROTIME-INR
INR: 1.03 (ref 0.00–1.49)
Prothrombin Time: 13.7 seconds (ref 11.6–15.2)

## 2012-05-25 MED ORDER — IOHEXOL 300 MG/ML  SOLN
100.0000 mL | Freq: Once | INTRAMUSCULAR | Status: AC | PRN
  Administered 2012-05-25: 100 mL via INTRAVENOUS

## 2012-05-25 NOTE — ED Notes (Signed)
Pt alert, nad, states got up from dinner, had BM, noted blood in stool, denies pain, ambulates to room, steady gait noted

## 2012-05-25 NOTE — ED Notes (Addendum)
Pt c/o rectal bleeding that started at 1930 today. He had a BM after eating and noted that the toilet was bright red. He had a normal BM this morning and states he usually doesn't have a two BMs in a day. Dinner consisted of salad and salmon. No nausea or vomiting. Allergic to grass, trees, and dust.

## 2012-05-25 NOTE — ED Provider Notes (Addendum)
History     CSN: [id number]  Arrival date & time 05/25/12  2000   First MD Initiated Contact with Patient 05/25/12 2042      Chief Complaint  Patient presents with  . Rectal Bleeding    (Consider location/radiation/quality/duration/timing/severity/associated sxs/prior treatment) Patient is a 77 y.o. male presenting with hematochezia. The history is provided by the patient.  Rectal Bleeding  The current episode started today. Episode frequency: one episode. The problem has been unchanged. The pain is moderate (feeling gurgling in the right abdomen). The stool is described as bloody and mixed with blood. There was no prior successful therapy. There was no prior unsuccessful therapy. Associated symptoms include abdominal pain. Pertinent negatives include no anorexia, no fever, no diarrhea, no nausea, no rectal pain and no difficulty breathing. He has been behaving normally. He has been eating and drinking normally. Urine output has been normal. His past medical history does not include abdominal surgery, recent abdominal injury or recent antibiotic use.    Past Medical History  Diagnosis Date  . Allergy   . Hyperlipidemia   . Hypertension   . Hyperglycemia   . ED (erectile dysfunction)     Past Surgical History  Procedure Date  . Inguinal hernia repair   . Knee arthroscopy     right knee  . Nasal sinus surgery   . Eye surgery     bilateral cataract extraction per Dr Jettie Pagan  . Colonoscopy 04-04-11    per Dr. Jarold Motto, clear, no repeats needed    Family History  Problem Relation Age of Onset  . Hypertension Mother   . Lung cancer Father   . Cancer Other     lung    History  Substance Use Topics  . Smoking status: Former Smoker    Quit date: 03/20/1969  . Smokeless tobacco: Never Used  . Alcohol Use: 4.2 oz/week    7 Glasses of wine per week      Review of Systems  Constitutional: Negative for fever.  Gastrointestinal: Positive for abdominal pain and hematochezia.  Negative for nausea, diarrhea, rectal pain and anorexia.  All other systems reviewed and are negative.    Allergies  Review of patient's allergies indicates no known allergies.  Home Medications   Current Outpatient Rx  Name Route Sig Dispense Refill  . ASPIRIN 81 MG PO TBEC Oral Take 81 mg by mouth daily.      . ATORVASTATIN CALCIUM 40 MG PO TABS  TAKE 1 TABLET ONCE DAILY 90 tablet 1  . CETIRIZINE HCL 10 MG PO TABS Oral Take 10 mg by mouth daily.     Marland Kitchen CINNAMON 500 MG PO TABS Oral Take by mouth daily.      . CO Q 10 PO Oral Take by mouth daily.      Marland Kitchen DILTIAZEM HCL ER COATED BEADS 300 MG PO CP24  TAKE 1 CAPSULE ONCE DAILY 90 capsule 1  . OMEGA-3 FATTY ACIDS 1000 MG PO CAPS Oral Take 2 g by mouth daily.      Marland Kitchen FLAX SEED OIL PO Oral Take by mouth daily.      Marland Kitchen HYDROCHLOROTHIAZIDE 12.5 MG PO CAPS Oral Take 12.5 mg by mouth daily.      Marland Kitchen HYDROCHLOROTHIAZIDE 12.5 MG PO TABS  TAKE 1 TABLET ONCE DAILY 90 tablet 1  . MULTI-VITAMIN/MINERALS PO TABS Oral Take 1 tablet by mouth daily.      Marland Kitchen SILDENAFIL CITRATE 100 MG PO TABS Oral Take 1 tablet (100 mg total)  by mouth as needed for erectile dysfunction. 30 tablet 3  . TERAZOSIN HCL 2 MG PO CAPS  TAKE 2 CAPSULES ONCE DAILY 180 capsule 1    BP 126/66  Pulse 85  Temp(Src) 98.5 F (36.9 C) (Oral)  Resp 20  Wt 205 lb (92.987 kg)  SpO2 98%  Physical Exam  Nursing note and vitals reviewed. Constitutional: He is oriented to person, place, and time. He appears well-developed and well-nourished. No distress.  HENT:  Head: Normocephalic and atraumatic.  Mouth/Throat: Oropharynx is clear and moist.  Eyes: Conjunctivae and EOM are normal. Pupils are equal, round, and reactive to light.  Neck: Normal range of motion. Neck supple.  Cardiovascular: Normal rate, regular rhythm and intact distal pulses.   No murmur heard. Pulmonary/Chest: Effort normal and breath sounds normal. No respiratory distress. He has no wheezes. He has no rales.  Abdominal:  Soft. He exhibits no distension. Bowel sounds are increased. There is no tenderness. There is no rebound and no guarding.  Genitourinary: Rectal exam shows no external hemorrhoid.       Bright red blood with rectal exam  Musculoskeletal: Normal range of motion. He exhibits no edema and no tenderness.  Neurological: He is alert and oriented to person, place, and time.  Skin: Skin is warm and dry. No rash noted. No erythema.  Psychiatric: He has a normal mood and affect. His behavior is normal.    ED Course  Procedures (including critical care time)  Labs Reviewed  CBC - Abnormal; Notable for the following:    Hemoglobin 12.3 (*)    HCT 38.5 (*)    All other components within normal limits  COMPREHENSIVE METABOLIC PANEL - Abnormal; Notable for the following:    Glucose, Bld 124 (*)    GFR calc non Af Amer 66 (*)    GFR calc Af Amer 76 (*)    All other components within normal limits  URINALYSIS, ROUTINE W REFLEX MICROSCOPIC - Abnormal; Notable for the following:    Specific Gravity, Urine 1.033 (*)    Ketones, ur >80 (*)    All other components within normal limits  DIFFERENTIAL  PROTIME-INR  APTT   Ct Abdomen Pelvis W Contrast  05/25/2012  *RADIOLOGY REPORT*  Clinical Data: Rectal bleeding.  CT ABDOMEN AND PELVIS WITH CONTRAST  Technique:  Multidetector CT imaging of the abdomen and pelvis was performed following the standard protocol during bolus administration of intravenous contrast.  Contrast: OMNIPAQUE IOHEXOL 300 MG/ML  SOLN  Comparison: None.  Findings: Rounded mass containing heterogeneous calcification and possible areas of enhancement in the anterior aspect of the mid to upper portion of the right kidney.  This measures 2.2 x 2.1 cm in maximum dimensions on image number 21.  The noncalcified portions of the mass measure 50 HU in minimum density.  Prominent stool in the colon with no colon masses or mucosal abnormalities seen.  Normal appearing appendix in the right lower  abdomen.  No gastric or small bowel abnormalities seen.  Small pericardial effusion measuring 1.5 cm in maximum thickness.  Linear density in the right lower lobe.  8 mm left lobe liver cyst on image #5 and 6 mm right lobe liver cyst on image number 14.  Unremarkable spleen, pancreas, adrenal glands, left kidney and urinary bladder.  Markedly enlarged prostate gland.  Small left inguinal hernia containing fat.  No enlarged lymph nodes.  Mildly enlarged heart.  Minimal right pleural fluid.  Lumbar and lower thoracic spine degenerative changes.  IMPRESSION:  1.  2.2 cm calcified right renal mass. This is compatible with a renal neoplasm, possibly malignant. 2.  Prominent stool throughout the colon with no visible mass. 3.  Right lower lobe linear atelectasis or scarring. 4.  Markedly enlarged prostate gland. 5.  Small left inguinal hernia containing fat. 6.  Small pericardial effusion.  Original Report Authenticated By: Darrol Angel, M.D.     1. GI bleeding   2. Renal mass       MDM   Patient with abdominal pain and bloody stools tonight times one. But states he still has "gurgling" in his abd and bright red blood on rectal exam.  Denies any shortness of breath or weakness. He has no prior history of bloody stools and is not on anticoagulation.  11:39 PM Hemoglobin has decreased from 14-12 but otherwise labs are within normal limits. CT showed a 2.2 cm right renal mass which was concerning for malignancy but no issues with the bowel. Spoke with GI who will see the patient will admit for further workup.  Gwyneth Sprout, MD 05/25/12 2340  Gwyneth Sprout, MD 05/26/12 4098

## 2012-05-26 ENCOUNTER — Encounter (HOSPITAL_COMMUNITY): Payer: Self-pay | Admitting: Emergency Medicine

## 2012-05-26 DIAGNOSIS — R1900 Intra-abdominal and pelvic swelling, mass and lump, unspecified site: Secondary | ICD-10-CM

## 2012-05-26 DIAGNOSIS — N289 Disorder of kidney and ureter, unspecified: Secondary | ICD-10-CM

## 2012-05-26 DIAGNOSIS — N2889 Other specified disorders of kidney and ureter: Secondary | ICD-10-CM

## 2012-05-26 DIAGNOSIS — K5731 Diverticulosis of large intestine without perforation or abscess with bleeding: Principal | ICD-10-CM

## 2012-05-26 DIAGNOSIS — E782 Mixed hyperlipidemia: Secondary | ICD-10-CM

## 2012-05-26 DIAGNOSIS — K625 Hemorrhage of anus and rectum: Secondary | ICD-10-CM

## 2012-05-26 DIAGNOSIS — K922 Gastrointestinal hemorrhage, unspecified: Secondary | ICD-10-CM

## 2012-05-26 DIAGNOSIS — D62 Acute posthemorrhagic anemia: Secondary | ICD-10-CM

## 2012-05-26 DIAGNOSIS — I10 Essential (primary) hypertension: Secondary | ICD-10-CM

## 2012-05-26 LAB — COMPREHENSIVE METABOLIC PANEL
ALT: 18 U/L (ref 0–53)
AST: 24 U/L (ref 0–37)
Albumin: 3.4 g/dL — ABNORMAL LOW (ref 3.5–5.2)
Alkaline Phosphatase: 34 U/L — ABNORMAL LOW (ref 39–117)
BUN: 20 mg/dL (ref 6–23)
CO2: 26 mEq/L (ref 19–32)
Calcium: 8.3 mg/dL — ABNORMAL LOW (ref 8.4–10.5)
Chloride: 105 mEq/L (ref 96–112)
Creatinine, Ser: 0.94 mg/dL (ref 0.50–1.35)
GFR calc Af Amer: >90 mL/min (ref >90)
GFR calc non Af Amer: 80 mL/min — ABNORMAL LOW (ref >90)
Glucose, Bld: 109 mg/dL — ABNORMAL HIGH (ref 70–99)
Potassium: 3.6 mEq/L (ref 3.5–5.1)
Sodium: 138 mEq/L (ref 135–145)
Total Bilirubin: 0.5 mg/dL (ref 0.3–1.2)
Total Protein: 5.3 g/dL — ABNORMAL LOW (ref 6.0–8.3)

## 2012-05-26 LAB — CBC
HCT: 30.6 % — ABNORMAL LOW (ref 39.0–52.0)
Hemoglobin: 10 g/dL — ABNORMAL LOW (ref 13.0–17.0)
MCH: 27.2 pg (ref 26.0–34.0)
MCHC: 32.7 g/dL (ref 30.0–36.0)
MCV: 83.2 fL (ref 78.0–100.0)
Platelets: 157 10*3/uL (ref 150–400)
RBC: 3.68 MIL/uL — ABNORMAL LOW (ref 4.22–5.81)
RDW: 13.8 % (ref 11.5–15.5)
WBC: 4.7 10*3/uL (ref 4.0–10.5)

## 2012-05-26 LAB — PROTIME-INR
INR: 1.14 (ref 0.00–1.49)
Prothrombin Time: 14.8 seconds (ref 11.6–15.2)

## 2012-05-26 LAB — TYPE AND SCREEN
ABO/RH(D): A POS
Antibody Screen: NEGATIVE

## 2012-05-26 LAB — ABO/RH: ABO/RH(D): A POS

## 2012-05-26 LAB — HEMOGLOBIN AND HEMATOCRIT, BLOOD
HCT: 30.9 % — ABNORMAL LOW (ref 39.0–52.0)
Hemoglobin: 10 g/dL — ABNORMAL LOW (ref 13.0–17.0)

## 2012-05-26 LAB — LACTIC ACID, PLASMA: Lactic Acid, Venous: 0.6 mmol/L (ref 0.5–2.2)

## 2012-05-26 MED ORDER — OMEGA-3-ACID ETHYL ESTERS 1 G PO CAPS
2.0000 g | ORAL_CAPSULE | Freq: Every day | ORAL | Status: DC
  Administered 2012-05-26 – 2012-05-28 (×3): 2 g via ORAL
  Filled 2012-05-26 (×3): qty 2

## 2012-05-26 MED ORDER — ACETAMINOPHEN 325 MG PO TABS: 650.0000 mg | ORAL_TABLET | Freq: Four times a day (QID) | ORAL | Status: DC | PRN

## 2012-05-26 MED ORDER — TERAZOSIN HCL 2 MG PO CAPS
2.0000 mg | ORAL_CAPSULE | Freq: Every day | ORAL | Status: DC
  Administered 2012-05-26 – 2012-05-27 (×2): 2 mg via ORAL
  Filled 2012-05-26 (×3): qty 1

## 2012-05-26 MED ORDER — ONDANSETRON HCL 4 MG PO TABS: 4.0000 mg | ORAL_TABLET | Freq: Four times a day (QID) | ORAL | Status: DC | PRN

## 2012-05-26 MED ORDER — ACETAMINOPHEN 650 MG RE SUPP: 650.0000 mg | Freq: Four times a day (QID) | RECTAL | Status: DC | PRN

## 2012-05-26 MED ORDER — SODIUM CHLORIDE 0.9 % IV SOLN
INTRAVENOUS | Status: DC
  Administered 2012-05-26 – 2012-05-27 (×3): via INTRAVENOUS

## 2012-05-26 MED ORDER — DILTIAZEM HCL ER COATED BEADS 300 MG PO CP24
300.0000 mg | ORAL_CAPSULE | Freq: Every day | ORAL | Status: DC
  Administered 2012-05-26: 300 mg via ORAL
  Filled 2012-05-26: qty 1

## 2012-05-26 MED ORDER — ONDANSETRON HCL 4 MG/2ML IJ SOLN: 4.0000 mg | Freq: Four times a day (QID) | INTRAMUSCULAR | Status: DC | PRN

## 2012-05-26 MED ORDER — SODIUM CHLORIDE 0.9 % IJ SOLN
3.0000 mL | Freq: Two times a day (BID) | INTRAMUSCULAR | Status: DC
  Administered 2012-05-26 – 2012-05-28 (×3): 3 mL via INTRAVENOUS

## 2012-05-26 MED ORDER — ATORVASTATIN CALCIUM 40 MG PO TABS
40.0000 mg | ORAL_TABLET | Freq: Every day | ORAL | Status: DC
  Administered 2012-05-27: 40 mg via ORAL
  Filled 2012-05-26 (×3): qty 1

## 2012-05-26 MED ORDER — PNEUMOCOCCAL VAC POLYVALENT 25 MCG/0.5ML IJ INJ
0.5000 mL | INJECTION | Freq: Once | INTRAMUSCULAR | Status: DC
  Filled 2012-05-26: qty 0.5

## 2012-05-26 NOTE — Progress Notes (Signed)
Patient admitted earlier this AM due to painless GI bleed.  He reports 4 bouts total.  Denies any abdominal discomfort currently.  Seen by Dr. Toniann Fail and agree with his assessment and plan.  General: Alert, awake, oriented x3, in no acute distress. HEENT: No bruits, no goiter. Heart: Regular rate and rhythm, without murmurs, rubs, gallops. Lungs: Clear to auscultation bilaterally. Abdomen: Soft, nontender no rebound guarding, nondistended, positive bowel sounds. Extremities: No clubbing cyanosis or edema with positive pedal pulses. Neuro: Grossly intact, nonfocal.   Will go ahead and consult GI for further management and evaluation.  Otherwise please refer to original H&P Assessment and plan.

## 2012-05-26 NOTE — Consult Note (Signed)
Referring Provider: [facility] hospital[facility] Primary Care Physician:  [name], MD, MD Primary Gastroenterologist:  Dr.Patterson  Reason for Consultation:   [name] [name] bleed  HPI: [name] [name] is a 77 y.o. male known to Dr. Sheryn Bison from colonoscopy done in April of 2012. This showed mild diverticular disease in the sigmoid colon and was otherwise negative exam. Patient has history of hypertension and hyperlipidemia, and is maintained on one baby aspirin daily. He has no previous history of GI bleeding. A set last evening after eating dinner with a grossly bloody bowel movement which was painless. He had one further episode in and came onto the emergency room. Since it arrival he has had 2 more episodes of bloody stools the last was midmorning today. He says the stool is bright and dark red mixed together. He has no complaints of abdominal pain or cramping but has had an unsettled feeling in his abdomen. He has not had any nausea or vomiting. He says he does feel a little bit weak when he gets up at denies dizziness.  CT scan was done on arrival in the emergency room which showed an incidental right renal mass measuring 2.2 x 2.1 cm which is felt concerning for a neoplasm, also noted to have a all pericardial effusion, otherwise negative exam. Review of records shows hemoglobin to be 14.3 in October of 2012. On arrival last night his hemoglobin was 12.3 hematocrit of 38.5 and this morning hemoglobin of 10 hematocrit of 30.6. Electrolytes are within normal limits and his coags are normal as well.  Past Medical History  Diagnosis Date  . Allergy   . Hyperlipidemia   . Hypertension   . Hyperglycemia   . ED (erectile dysfunction)     Past Surgical History  Procedure Date  . Inguinal hernia repair   . Knee arthroscopy     right knee  . Nasal sinus surgery   . Eye surgery     bilateral cataract extraction per Dr Jettie Pagan  . Colonoscopy 04-04-11    per Dr. Jarold Motto, clear, no repeats needed     Prior to Admission medications   Medication Sig Start Date End Date Taking? Authorizing Provider  aspirin 81 MG EC tablet Take 81 mg by mouth daily.     Yes Historical Provider, MD  atorvastatin (LIPITOR) 40 MG tablet TAKE 1 TABLET ONCE DAILY 05/06/12  Yes [name], MD  cetirizine (ZYRTEC) 10 MG tablet Take 10 mg by mouth daily.    Yes Historical Provider, MD  Cinnamon 500 MG TABS Take by mouth daily.     Yes Historical Provider, MD  Coenzyme Q10 (CO Q 10 PO) Take by mouth daily.     Yes Historical Provider, MD  diltiazem (CARDIZEM CD) 300 MG 24 hr capsule TAKE 1 CAPSULE ONCE DAILY 04/15/12  Yes [name], MD  fish oil-omega-3 fatty acids 1000 MG capsule Take 2 g by mouth daily.     Yes Historical Provider, MD  Flaxseed, Linseed, (FLAX SEED OIL PO) Take by mouth daily.     Yes Historical Provider, MD  hydrochlorothiazide (,MICROZIDE/HYDRODIURIL,) 12.5 MG capsule Take 12.5 mg by mouth daily.     Yes Historical Provider, MD  hydrochlorothiazide (HYDRODIURIL) 12.5 MG tablet TAKE 1 TABLET ONCE DAILY 04/15/12  Yes [name], MD  Multiple Vitamins-Minerals (MULTIVITAMIN WITH MINERALS) tablet Take 1 tablet by mouth daily.     Yes Historical Provider, MD  sildenafil (VIAGRA) 100 MG tablet Take 1 tablet (100 mg total) by  mouth as needed for erectile dysfunction. 10/18/11 10/17/12 Yes [name], MD  terazosin (HYTRIN) 2 MG capsule TAKE 2 CAPSULES ONCE DAILY 04/15/12  Yes [name], MD    Current Facility-Administered Medications  Medication Dose Route Frequency Provider Last Rate Last Dose  . 0.9 %  sodium chloride infusion   Intravenous Continuous Eduard Clos, MD 75 mL/hr at 05/26/12 0348    . acetaminophen (TYLENOL) tablet 650 mg  650 mg Oral Q6H PRN Eduard Clos, MD       Or  . acetaminophen (TYLENOL) suppository 650 mg  650 mg Rectal Q6H PRN Eduard Clos, MD      . atorvastatin (LIPITOR) tablet 40 mg  40 mg Oral q1800 Eduard Clos, MD      .  diltiazem (CARDIZEM CD) 24 hr capsule 300 mg  300 mg Oral Daily Eduard Clos, MD   300 mg at 05/26/12 1040  . iohexol (OMNIPAQUE) 300 MG/ML solution 100 mL  100 mL Intravenous Once PRN Medication Radiologist, MD   100 mL at 05/25/12 2302  . omega-3 acid ethyl esters (LOVAZA) capsule 2 g  2 g Oral Daily Eduard Clos, MD   2 g at 05/26/12 1040  . ondansetron (ZOFRAN) tablet 4 mg  4 mg Oral Q6H PRN Eduard Clos, MD       Or  . ondansetron [facility]) injection 4 mg  4 mg Intravenous Q6H PRN Eduard Clos, MD      . pneumococcal 23 valent vaccine (PNU-IMMUNE) injection 0.5 mL  0.5 mL Intramuscular Once Eduard Clos, MD      . sodium chloride 0.9 % injection 3 mL  3 mL Intravenous Q12H Eduard Clos, MD   3 mL at 05/26/12 0348  . terazosin (HYTRIN) capsule 2 mg  2 mg Oral QHS Eduard Clos, MD        Allergies as of 05/25/2012  . (No Known Allergies)    Family History  Problem Relation Age of Onset  . Hypertension Mother   . Lung cancer Father   . Cancer Other     lung    History   Social History  . Marital Status: Married    Spouse Name: N/A    Number of Children: N/A  . Years of Education: N/A   Occupational History  . Not on file.   Social History Main Topics  . Smoking status: Former Smoker    Quit date: 03/20/1969  . Smokeless tobacco: Never Used  . Alcohol Use: 4.2 oz/week    7 Glasses of wine per week  . Drug Use: No  . Sexually Active: Not on file   Other Topics Concern  . Not on file   Social History Narrative  . No narrative on file    Review of Systems: Pertinent positive and negative review of systems were noted in the above HPI section.  All other review of systems was otherwise negative.  Physical Exam: Vital signs in last 24 hours: Temp:  [97.9 F (36.6 C)-98.7 F (37.1 C)] 98.7 F (37.1 C) (06/01 1400) Pulse Rate:  [72-101] 78  (06/01 1400) Resp:  [17-20] 18  (06/01 1400) BP: (100-131)/(59-71) 111/59  mmHg (06/01 1400) SpO2:  [97 %-99 %] 99 % (06/01 1400) Weight:  [191 lb 9.6 oz (86.909 kg)-205 lb (92.987 kg)] 191 lb 9.6 oz (86.909 kg) (06/01 0139) Last BM Date: 05/26/12 General:   Alert,  Well-developed, well-nourished,thin AA male pleasant and  cooperative in NAD Head:  Normocephalic and atraumatic. Eyes:  Sclera clear, no icterus.   Conjunctiva pink. Ears:  Normal auditory acuity. Nose:  No deformity, discharge,  or lesions. Mouth:  No deformity or lesions.   Neck:  Supple; no masses or thyromegaly. Lungs:  Clear throughout to auscultation.   No wheezes, crackles, or rhonchi. Heart:  Regular rate and rhythm; no murmurs, clicks, rubs,  or gallops. Abdomen:  Soft,nontender, BS active,nonpalp mass or hsm.   Rectal:  Deferred  Msk:  Symmetrical without gross deformities. . Pulses:  Normal pulses noted. Extremities:  Without clubbing or edema. Neurologic:  Alert and  oriented x4;  grossly normal neurologically. Skin:  Intact without significant lesions or rashes.. Psych:  Alert and cooperative. Normal mood and affect.  Intake/Output from previous day: 05/31 0701 - 06/01 0700 In: 168 [I.V.:168] Out: -  Intake/Output this shift: Total I/O In: -  Out: 1 [Stool:1]  Lab Results:  Basename 05/26/12 0600 05/25/12 2130  WBC 4.7 5.5  HGB 10.0* 12.3*  HCT 30.6* 38.5*  PLT 157 185   BMET  Basename 05/26/12 0600 05/25/12 2130  NA 138 142  K 3.6 3.7  CL 105 105  CO2 26 24  GLUCOSE 109* 124*  BUN 20 20  CREATININE 0.94 1.08  CALCIUM 8.3* 9.2   LFT  Basename 05/26/12 0600  PROT 5.3*  ALBUMIN 3.4*  AST 24  ALT 18  ALKPHOS 34*  BILITOT 0.5  BILIDIR --  IBILI --   PT/INR  Basename 05/26/12 0600 05/25/12 2130  LABPROT 14.8 13.7  INR 1.14 1.03      Studies/Results: Ct Abdomen Pelvis W Contrast  05/25/2012  *RADIOLOGY REPORT*  Clinical Data: Rectal bleeding.  CT ABDOMEN AND PELVIS WITH CONTRAST  Technique:  Multidetector CT imaging of the abdomen and pelvis was  performed following the standard protocol during bolus administration of intravenous contrast.  Contrast: OMNIPAQUE IOHEXOL 300 MG/ML  SOLN  Comparison: None.  Findings: Rounded mass containing heterogeneous calcification and possible areas of enhancement in the anterior aspect of the mid to upper portion of the right kidney.  This measures 2.2 x 2.1 cm in maximum dimensions on image number 21.  The noncalcified portions of the mass measure 50 HU in minimum density.  Prominent stool in the colon with no colon masses or mucosal abnormalities seen.  Normal appearing appendix in the right lower abdomen.  No gastric or small bowel abnormalities seen.  Small pericardial effusion measuring 1.5 cm in maximum thickness.  Linear density in the right lower lobe.  8 mm left lobe liver cyst on image #5 and 6 mm right lobe liver cyst on image number 14.  Unremarkable spleen, pancreas, adrenal glands, left kidney and urinary bladder.  Markedly enlarged prostate gland.  Small left inguinal hernia containing fat.  No enlarged lymph nodes.  Mildly enlarged heart.  Minimal right pleural fluid.  Lumbar and lower thoracic spine degenerative changes.  IMPRESSION:  1.  2.2 cm calcified right renal mass. This is compatible with a renal neoplasm, possibly malignant. 2.  Prominent stool throughout the colon with no visible mass. 3.  Right lower lobe linear atelectasis or scarring. 4.  Markedly enlarged prostate gland. 5.  Small left inguinal hernia containing fat. 6.  Small pericardial effusion.  Original Report Authenticated By: Darrol Angel, M.D.    IMPRESSION:  #59 77 year old male with acute lower GI bleed which is most consistent with a diverticular hemorrhage. He is hemodynamically stable. #2 normocytic  anemia secondary to acute blood blood loss #3 new right renal mass found on CT #4 history of hypertension #5 history of hyperlipidemia  PLAN: #1 continue supportive management for now, hopefully bleeding will resolve  without intervention. #2 serial hemoglobins and transfuse as needed for hemoglobin 8 or less #3 clear liquid diet. #4 hold cardizem  Thank you we will follow with you   Amy Esterwood  05/26/2012, 3:26 PM      GI ATTENDING  History, laboratories, x-rays, and prior endoscopy reports personally reviewed.Patient seen and examined. Agree with H&P as outlined above. He appears to have a hemodynamically stable lower GI bleed, likely diverticular. Agree with ongoing supportive measures. Primary service, or primary provider as outpatient, to address renal lesion. Discussed with patient. Will follow.  Wilhemina Bonito. Eda Keys., M.D. [facility] Division of Gastroenterology

## 2012-05-26 NOTE — ED Notes (Signed)
Pt is taking night medications of 2mg  of Terazosin and Lipitor 40 mg.

## 2012-05-26 NOTE — H&P (Signed)
[name] [name] is an 77 y.o. male.   PCP - Dr.Stephen Clent Ridges Gastroenterologist - Dr.Patterson. Chief Complaint: Rectal bleeding. HPI: 77 year-old male with known history of hypertension and hyperlipidemia had an episode of rectal bleeding after supper. Patient states he did have a regular bowel movement in the morning yesterday. Last evening after supper he had some gurgling feeling in the abdomen after which he had a bowel movement which was frankly bloody. After which he came to the ER. Patient was found to be hemodynamically stable. CT abdomen and pelvis was done which did not show a specific reason for GI bleed but did show renal mass concerning for malignancy. Gastroenterologist on call was consulted by the ER physician and they have requested hospitalist admission. Patient has had a second bowel movement which also was bloody while in the hospital. Repeat hemoglobin is pending. Patient states he has had a colonoscopy 2 years ago which was normal.  Past Medical History  Diagnosis Date  . Allergy   . Hyperlipidemia   . Hypertension   . Hyperglycemia   . ED (erectile dysfunction)     Past Surgical History  Procedure Date  . Inguinal hernia repair   . Knee arthroscopy     right knee  . Nasal sinus surgery   . Eye surgery     bilateral cataract extraction per Dr Jettie Pagan  . Colonoscopy 04-04-11    per Dr. Jarold Motto, clear, no repeats needed    Family History  Problem Relation Age of Onset  . Hypertension Mother   . Lung cancer Father   . Cancer Other     lung   Social History:  reports that he quit smoking about 43 years ago. He has never used smokeless tobacco. He reports that he drinks about 4.2 ounces of alcohol per week. He reports that he does not use illicit drugs.  Allergies: No Known Allergies  Medications Prior to Admission  Medication Sig Dispense Refill  . aspirin 81 MG EC tablet Take 81 mg by mouth daily.        Marland Kitchen atorvastatin (LIPITOR) 40 MG tablet TAKE 1 TABLET ONCE  DAILY  90 tablet  1  . cetirizine (ZYRTEC) 10 MG tablet Take 10 mg by mouth daily.       . Cinnamon 500 MG TABS Take by mouth daily.        . Coenzyme Q10 (CO Q 10 PO) Take by mouth daily.        Marland Kitchen diltiazem (CARDIZEM CD) 300 MG 24 hr capsule TAKE 1 CAPSULE ONCE DAILY  90 capsule  1  . fish oil-omega-3 fatty acids 1000 MG capsule Take 2 g by mouth daily.        . Flaxseed, Linseed, (FLAX SEED OIL PO) Take by mouth daily.        . hydrochlorothiazide (,MICROZIDE/HYDRODIURIL,) 12.5 MG capsule Take 12.5 mg by mouth daily.        . hydrochlorothiazide (HYDRODIURIL) 12.5 MG tablet TAKE 1 TABLET ONCE DAILY  90 tablet  1  . Multiple Vitamins-Minerals (MULTIVITAMIN WITH MINERALS) tablet Take 1 tablet by mouth daily.        . sildenafil (VIAGRA) 100 MG tablet Take 1 tablet (100 mg total) by mouth as needed for erectile dysfunction.  30 tablet  3  . terazosin (HYTRIN) 2 MG capsule TAKE 2 CAPSULES ONCE DAILY  180 capsule  1    Results for orders placed during the hospital encounter of 05/25/12 (from the past 48 hour(s))  CBC     Status: Abnormal   Collection Time   05/25/12  9:30 PM      Component Value Range Comment   WBC 5.5  4.0 - 10.5 (K/uL)    RBC 4.60  4.22 - 5.81 (MIL/uL)    Hemoglobin 12.3 (*) 13.0 - 17.0 (g/dL)    HCT 16.1 (*) 09.6 - 52.0 (%)    MCV 83.7  78.0 - 100.0 (fL)    MCH 26.7  26.0 - 34.0 (pg)    MCHC 31.9  30.0 - 36.0 (g/dL)    RDW 04.5  40.9 - 81.1 (%)    Platelets 185  150 - 400 (K/uL)   DIFFERENTIAL     Status: Normal   Collection Time   05/25/12  9:30 PM      Component Value Range Comment   Neutrophils Relative 72  43 - 77 (%)    Neutro Abs 3.9  1.7 - 7.7 (K/uL)    Lymphocytes Relative 17  12 - 46 (%)    Lymphs Abs 0.9  0.7 - 4.0 (K/uL)    Monocytes Relative 8  3 - 12 (%)    Monocytes Absolute 0.5  0.1 - 1.0 (K/uL)    Eosinophils Relative 2  0 - 5 (%)    Eosinophils Absolute 0.1  0.0 - 0.7 (K/uL)    Basophils Relative 1  0 - 1 (%)    Basophils Absolute 0.0  0.0 -  0.1 (K/uL)   COMPREHENSIVE METABOLIC PANEL     Status: Abnormal   Collection Time   05/25/12  9:30 PM      Component Value Range Comment   Sodium 142  135 - 145 (mEq/L)    Potassium 3.7  3.5 - 5.1 (mEq/L)    Chloride 105  96 - 112 (mEq/L)    CO2 24  19 - 32 (mEq/L)    Glucose, Bld 124 (*) 70 - 99 (mg/dL)    BUN 20  6 - 23 (mg/dL)    Creatinine, Ser 9.14  0.50 - 1.35 (mg/dL)    Calcium 9.2  8.4 - 10.5 (mg/dL)    Total Protein 6.8  6.0 - 8.3 (g/dL)    Albumin 4.4  3.5 - 5.2 (g/dL)    AST 34  0 - 37 (U/L)    ALT 24  0 - 53 (U/L)    Alkaline Phosphatase 45  39 - 117 (U/L)    Total Bilirubin 0.5  0.3 - 1.2 (mg/dL)    GFR calc non Af Amer 66 (*) >90 (mL/min)    GFR calc Af Amer 76 (*) >90 (mL/min)   PROTIME-INR     Status: Normal   Collection Time   05/25/12  9:30 PM      Component Value Range Comment   Prothrombin Time 13.7  11.6 - 15.2 (seconds)    INR 1.03  0.00 - 1.49    APTT     Status: Normal   Collection Time   05/25/12  9:30 PM      Component Value Range Comment   aPTT 28  24 - 37 (seconds)   URINALYSIS, ROUTINE W REFLEX MICROSCOPIC     Status: Abnormal   Collection Time   05/25/12  9:56 PM      Component Value Range Comment   Color, Urine YELLOW  YELLOW     APPearance CLEAR  CLEAR     Specific Gravity, Urine 1.033 (*) 1.005 - 1.030     pH 5.5  5.0 - 8.0     Glucose, UA NEGATIVE  NEGATIVE (mg/dL)    Hgb urine dipstick NEGATIVE  NEGATIVE     Bilirubin Urine NEGATIVE  NEGATIVE     Ketones, ur >80 (*) NEGATIVE (mg/dL)    Protein, ur NEGATIVE  NEGATIVE (mg/dL)    Urobilinogen, UA 1.0  0.0 - 1.0 (mg/dL)    Nitrite NEGATIVE  NEGATIVE     Leukocytes, UA NEGATIVE  NEGATIVE  MICROSCOPIC NOT DONE ON URINES WITH NEGATIVE PROTEIN, BLOOD, LEUKOCYTES, NITRITE, OR GLUCOSE <1000 mg/dL.   Ct Abdomen Pelvis W Contrast  05/25/2012  *RADIOLOGY REPORT*  Clinical Data: Rectal bleeding.  CT ABDOMEN AND PELVIS WITH CONTRAST  Technique:  Multidetector CT imaging of the abdomen and pelvis was  performed following the standard protocol during bolus administration of intravenous contrast.  Contrast: OMNIPAQUE IOHEXOL 300 MG/ML  SOLN  Comparison: None.  Findings: Rounded mass containing heterogeneous calcification and possible areas of enhancement in the anterior aspect of the mid to upper portion of the right kidney.  This measures 2.2 x 2.1 cm in maximum dimensions on image number 21.  The noncalcified portions of the mass measure 50 HU in minimum density.  Prominent stool in the colon with no colon masses or mucosal abnormalities seen.  Normal appearing appendix in the right lower abdomen.  No gastric or small bowel abnormalities seen.  Small pericardial effusion measuring 1.5 cm in maximum thickness.  Linear density in the right lower lobe.  8 mm left lobe liver cyst on image #5 and 6 mm right lobe liver cyst on image number 14.  Unremarkable spleen, pancreas, adrenal glands, left kidney and urinary bladder.  Markedly enlarged prostate gland.  Small left inguinal hernia containing fat.  No enlarged lymph nodes.  Mildly enlarged heart.  Minimal right pleural fluid.  Lumbar and lower thoracic spine degenerative changes.  IMPRESSION:  1.  2.2 cm calcified right renal mass. This is compatible with a renal neoplasm, possibly malignant. 2.  Prominent stool throughout the colon with no visible mass. 3.  Right lower lobe linear atelectasis or scarring. 4.  Markedly enlarged prostate gland. 5.  Small left inguinal hernia containing fat. 6.  Small pericardial effusion.  Original Report Authenticated By: Darrol Angel, M.D.    Review of Systems  Constitutional: Negative.   HENT: Negative.   Eyes: Negative.   Respiratory: Negative.   Cardiovascular: Negative.   Gastrointestinal: Positive for blood in stool.  Genitourinary: Negative.   Musculoskeletal: Negative.   Skin: Negative.   Neurological: Negative.   Endo/Heme/Allergies: Negative.   Psychiatric/Behavioral: Negative.     Blood pressure  131/71, pulse 84, temperature 97.9 F (36.6 C), temperature source Oral, resp. rate 18, height 6\' 1"  (1.854 m), weight 86.909 kg (191 lb 9.6 oz), SpO2 99.00%. Physical Exam  Constitutional: He is oriented to person, place, and time. He appears well-developed and well-nourished. No distress.  HENT:  Head: Normocephalic and atraumatic.  Right Ear: External ear normal.  Left Ear: External ear normal.  Nose: Nose normal.  Mouth/Throat: Oropharynx is clear and moist. No oropharyngeal exudate.  Eyes: Conjunctivae are normal. Pupils are equal, round, and reactive to light. Right eye exhibits no discharge. Left eye exhibits no discharge. No scleral icterus.  Neck: Normal range of motion. Neck supple.  Cardiovascular: Normal rate and regular rhythm.   Respiratory: Effort normal and breath sounds normal. No respiratory distress. He has no wheezes. He has no rales.  GI: Soft. Bowel sounds are normal.  He exhibits no distension. There is no tenderness. There is no rebound.  Musculoskeletal: Normal range of motion. He exhibits no edema and no tenderness.  Neurological: He is alert and oriented to person, place, and time.       Moves all extremities.  Skin: Skin is warm and dry. He is not diaphoretic.  Psychiatric: His behavior is normal.     Assessment/Plan #1. Painless rectal bleeding - could be diverticular in origin. Patient will be kept on clear liquid diet. Recheck hemoglobin. Consult GI in a.m. #2. Right renal mass concerning for malignancy - will need further workup as outpatient. I did explain to the patient about the renal mass and the concern for malignancy and the importance of followup. #3. Hypertension - continue home medications. #4. Hyperlipidemia - continue home medications.  CODE STATUS - full code.  Kerrie Timm N. 05/26/2012, 4:19 AM

## 2012-05-27 DIAGNOSIS — I10 Essential (primary) hypertension: Secondary | ICD-10-CM

## 2012-05-27 DIAGNOSIS — E782 Mixed hyperlipidemia: Secondary | ICD-10-CM

## 2012-05-27 DIAGNOSIS — K625 Hemorrhage of anus and rectum: Secondary | ICD-10-CM

## 2012-05-27 DIAGNOSIS — R1900 Intra-abdominal and pelvic swelling, mass and lump, unspecified site: Secondary | ICD-10-CM

## 2012-05-27 LAB — HEMOGLOBIN AND HEMATOCRIT, BLOOD
HCT: 31.4 % — ABNORMAL LOW (ref 39.0–52.0)
HCT: 28.6 % — ABNORMAL LOW (ref 39.0–52.0)
Hemoglobin: 10.1 g/dL — ABNORMAL LOW (ref 13.0–17.0)
Hemoglobin: 9.2 g/dL — ABNORMAL LOW (ref 13.0–17.0)

## 2012-05-27 NOTE — Progress Notes (Signed)
Patient ID: [name] [name], male   DOB: 08/06/1935, 77 y.o.   MRN: [id number] [facility] Progress Note  Subjective: Feels good- no further stools pr bleeding. No c/o abdominal pain-hungry  Objective:  Vital signs in last 24 hours: Temp:  [97.3 F (36.3 C)-98.7 F (37.1 C)] 97.3 F (36.3 C) (06/02 0657) Pulse Rate:  [61-78] 62  (06/02 0657) Resp:  [18-20] 20  (06/02 0657) BP: (111-120)/(59-73) 111/65 mmHg (06/02 0657) SpO2:  [98 %-100 %] 100 % (06/02 0657) Last BM Date: 05/26/12 General:   Alert,  Well-developed,    in NAD Heart:  Regular rate and rhythm; no murmurs Pulm;clear Abdomen:  Soft, nontender and nondistended. Normal bowel sounds, without guarding.   Extremities:  Without edema. Neurologic:  Alert and  oriented x4;  grossly normal neurologically. Psych:  Alert and cooperative. Normal mood and affect.  Intake/Output from previous day: 06/01 0701 - 06/02 0700 In: -  Out: 1 [Stool:1] Intake/Output this shift:    Lab Results:  Basename 05/27/12 1100 05/26/12 1758 05/26/12 0600 05/25/12 2130  WBC -- -- 4.7 5.5  HGB 10.1* 10.0* 10.0* --  HCT 31.4* 30.9* 30.6* --  PLT -- -- 157 185   BMET  Basename 05/26/12 0600 05/25/12 2130  NA 138 142  K 3.6 3.7  CL 105 105  CO2 26 24  GLUCOSE 109* 124*  BUN 20 20  CREATININE 0.94 1.08  CALCIUM 8.3* 9.2   LFT  Basename 05/26/12 0600  PROT 5.3*  ALBUMIN 3.4*  AST 24  ALT 18  ALKPHOS 34*  BILITOT 0.5  BILIDIR --  IBILI --   PT/INR  Basename 05/26/12 0600 05/25/12 2130  LABPROT 14.8 13.7  INR 1.14 1.03    Assessment / Plan: #1 77 yo male with acute lower Gi bleed consistent with diverticular bleed. No bleeding since admit #2 anemia- secondary to blood loss ,stable #3 renal mass -found on Ct- will need out patient urology appt  Will advance diet to low residue Probably Ok for discharge in am if no further bleeding. He can follow up with Dr. Jarold Motto or Amy Esterwood PA-C in 2 weeks, repeat  hgb etc Principal Problem:  *GI bleeding Active Problems:  HYPERLIPIDEMIA  HYPERTENSION  Renal mass  Diverticulosis of colon with hemorrhage  Acute posthemorrhagic anemia     LOS: 2 days   Amy Esterwood  05/27/2012, 1:10 PM  GI ATTENDING  Patient's seen and examined. Laboratories reviewed. No bowel movements. No bleeding. Hemoglobin stable. A green with advancing the patient's diet. Probable discharge in morning. He is aware of renal mass on CT and the need for outpatient urology followup. I would ask the admitting service to please arrange this for the patient.GI followup as above. Amy Esterwood to followup.Will sign off  [name] [name] N. Eda Keys., M.D. [facility] of Gastroenterology

## 2012-05-27 NOTE — Progress Notes (Signed)
Subjective: Patient mentions that his bleeding has subsided.  Denies any abdominal pain, nausea, emesis, weakness.  No acute issues reported overnight.  Objective: Filed Vitals:   05/26/12 0613 05/26/12 1400 05/26/12 2341 05/27/12 0657  BP: 100/65 111/59 120/73 111/65  Pulse: 72 78 61 62  Temp: 98.1 F (36.7 C) 98.7 F (37.1 C) 98 F (36.7 C) 97.3 F (36.3 C)  TempSrc: Oral Oral Oral Oral  Resp: 17 18 18 20   Height:      Weight:      SpO2: 98% 99% 98% 100%   Weight change:  No intake or output data in the 24 hours ending 05/27/12 1442  General: Alert, awake, oriented x3, in no acute distress.  HEENT: No bruits, no goiter.  Heart: Regular rate and rhythm, without murmurs, rubs, gallops.  Lungs: Clear to auscultation, bilateral air movement.  Abdomen: Soft, nontender, nondistended, positive bowel sounds.  Neuro: Grossly intact, nonfocal.   Lab Results:  Basename 05/26/12 0600 05/25/12 2130  NA 138 142  K 3.6 3.7  CL 105 105  CO2 26 24  GLUCOSE 109* 124*  BUN 20 20  CREATININE 0.94 1.08  CALCIUM 8.3* 9.2  MG -- --  PHOS -- --    Basename 05/26/12 0600 05/25/12 2130  AST 24 34  ALT 18 24  ALKPHOS 34* 45  BILITOT 0.5 0.5  PROT 5.3* 6.8  ALBUMIN 3.4* 4.4   No results found for this basename: LIPASE:2,AMYLASE:2 in the last 72 hours  Basename 05/27/12 1100 05/26/12 1758 05/26/12 0600 05/25/12 2130  WBC -- -- 4.7 5.5  NEUTROABS -- -- -- 3.9  HGB 10.1* 10.0* -- --  HCT 31.4* 30.9* -- --  MCV -- -- 83.2 83.7  PLT -- -- 157 185   No results found for this basename: CKTOTAL:3,CKMB:3,CKMBINDEX:3,TROPONINI:3 in the last 72 hours No components found with this basename: POCBNP:3 No results found for this basename: DDIMER:2 in the last 72 hours No results found for this basename: HGBA1C:2 in the last 72 hours No results found for this basename: CHOL:2,HDL:2,LDLCALC:2,TRIG:2,CHOLHDL:2,LDLDIRECT:2 in the last 72 hours No results found for this basename:  TSH,T4TOTAL,FREET3,T3FREE,THYROIDAB in the last 72 hours No results found for this basename: VITAMINB12:2,FOLATE:2,FERRITIN:2,TIBC:2,IRON:2,RETICCTPCT:2 in the last 72 hours  Micro Results: No results found for this or any previous visit (from the past 240 hour(s)).  Studies/Results: Ct Abdomen Pelvis W Contrast  05/25/2012  *RADIOLOGY REPORT*  Clinical Data: Rectal bleeding.  CT ABDOMEN AND PELVIS WITH CONTRAST  Technique:  Multidetector CT imaging of the abdomen and pelvis was performed following the standard protocol during bolus administration of intravenous contrast.  Contrast: OMNIPAQUE IOHEXOL 300 MG/ML  SOLN  Comparison: None.  Findings: Rounded mass containing heterogeneous calcification and possible areas of enhancement in the anterior aspect of the mid to upper portion of the right kidney.  This measures 2.2 x 2.1 cm in maximum dimensions on image number 21.  The noncalcified portions of the mass measure 50 HU in minimum density.  Prominent stool in the colon with no colon masses or mucosal abnormalities seen.  Normal appearing appendix in the right lower abdomen.  No gastric or small bowel abnormalities seen.  Small pericardial effusion measuring 1.5 cm in maximum thickness.  Linear density in the right lower lobe.  8 mm left lobe liver cyst on image #5 and 6 mm right lobe liver cyst on image number 14.  Unremarkable spleen, pancreas, adrenal glands, left kidney and urinary bladder.  Markedly enlarged prostate gland.  Small left inguinal hernia containing fat.  No enlarged lymph nodes.  Mildly enlarged heart.  Minimal right pleural fluid.  Lumbar and lower thoracic spine degenerative changes.  IMPRESSION:  1.  2.2 cm calcified right renal mass. This is compatible with a renal neoplasm, possibly malignant. 2.  Prominent stool throughout the colon with no visible mass. 3.  Right lower lobe linear atelectasis or scarring. 4.  Markedly enlarged prostate gland. 5.  Small left inguinal hernia  containing fat. 6.  Small pericardial effusion.  Original Report Authenticated By: Darrol Angel, M.D.    Medications: I have reviewed the patient's current medications.   Patient Active Hospital Problem List: GI bleeding (05/26/2012) GI on board.  Following cbc counts and they have been stable.  Possible d/c home tomorrow if bleeding ceases.  HYPERLIPIDEMIA (07/11/2007) Stable currently will plan on continuing home regimen.  HYPERTENSION (07/11/2007) Well controlled currently will continue to monitor.  Renal mass (05/26/2012) Patient to have urology appointment set up on discharge for further evaluation.  Just discussed with patient and he is aware and verbalizes understanding.  Diverticulosis of colon with hemorrhage (05/26/2012) Please refer to # 1.   Acute posthemorrhagic anemia (05/26/2012) Has ceased.  We will continue to monitor cbc and last one was 10.1.  May go home tomorrow if bleeding ceases.      LOS: 2 days   Penny Pia M.D.  [facility] 05/27/2012, 2:42 PM

## 2012-05-28 ENCOUNTER — Encounter: Payer: Self-pay | Admitting: Gastroenterology

## 2012-05-28 DIAGNOSIS — I10 Essential (primary) hypertension: Secondary | ICD-10-CM

## 2012-05-28 DIAGNOSIS — E782 Mixed hyperlipidemia: Secondary | ICD-10-CM

## 2012-05-28 DIAGNOSIS — R1900 Intra-abdominal and pelvic swelling, mass and lump, unspecified site: Secondary | ICD-10-CM

## 2012-05-28 DIAGNOSIS — K625 Hemorrhage of anus and rectum: Secondary | ICD-10-CM

## 2012-05-28 LAB — HEMOGLOBIN AND HEMATOCRIT, BLOOD
HCT: 28.7 % — ABNORMAL LOW (ref 39.0–52.0)
Hemoglobin: 9.4 g/dL — ABNORMAL LOW (ref 13.0–17.0)

## 2012-05-28 NOTE — Discharge Summary (Signed)
Physician Discharge Summary  [name] [name]:[id number] DOB: [birth year] DOA: 05/25/2012  PCP: [name], MD, MD  Admit date: 05/25/2012 Discharge date: 05/28/2012  Discharge Diagnoses:  Principal Problem:  *GI bleeding Active Problems:  HYPERLIPIDEMIA  HYPERTENSION  Renal mass  Diverticulosis of colon with hemorrhage  Acute posthemorrhagic anemia   Discharge Condition: Stable   Disposition:  Follow-up Information    Call Sheryn Bison, MD. (call for appt in 2 weeks)    Contact information:   [address] [facility] [address] [place] [address] [place] [address] [phone]          History of present illness:  From original HPI: 77 year-old male with known history of hypertension and hyperlipidemia had an episode of rectal bleeding after supper. Patient states he did have a regular bowel movement in the morning yesterday. Last evening after supper he had some gurgling feeling in the abdomen after which he had a bowel movement which was frankly bloody. After which he came to the ER. Patient was found to be hemodynamically stable. CT abdomen and pelvis was done which did not show a specific reason for GI bleed but did show renal mass concerning for malignancy. Gastroenterologist on call was consulted by the ER physician and they have requested hospitalist admission. Patient has had a second bowel movement which also was bloody while in the hospital. Repeat hemoglobin is pending.  Patient states he has had a colonoscopy 2 years ago which was normal.  CT of abdomen and pelvis on 05/25/12 showed a 2.2 cm calcified right renal mass.  Hospital Course:  Patient initially had 2 more bloody bowel movements but bleeding ceased and patient was monitored and treated supportively.  He denied any abdominal discomfort throughout his admission.  GI bleeding (05/26/2012) GI on board. Following cbc counts and they have been stable. Bleeding has ceased and as such patient is stable to  go home with follow up in ~ 2 weeks with gastroenterology.  Was thought to be secondary to diverticular bleed.  HYPERLIPIDEMIA (07/11/2007) Stable currently will plan on continuing home regimen.   HYPERTENSION (07/11/2007) Well controlled given low normal potassium level of 3.6 on 6/1 and last recorded blood pressure of 115/71- 137/73 will plan on holding his HCTZ.  Renal mass (05/26/2012) Patient to have urology appointment on 06/07/12 at 1230 PM. Just discussed with patient and he is aware and verbalizes understanding.   Acute posthemorrhagic anemia (05/26/2012) Has ceased. We will continue to monitor cbc and last one was 9.4. Will have patient transition home with follow up as indicated below.   Paroxysmal Tachycardia: Plan on continuing home regimen.  Currently stable.   Discharge Exam: Filed Vitals:   05/28/12 0540  BP: 115/71  Pulse: 68  Temp: 98.4 F (36.9 C)  Resp: 18   Filed Vitals:   05/27/12 0657 05/27/12 1515 05/27/12 2246 05/28/12 0540  BP: 111/65 137/73 136/76 115/71  Pulse: 62 80 79 68  Temp: 97.3 F (36.3 C) 98.1 F (36.7 C) 98.2 F (36.8 C) 98.4 F (36.9 C)  TempSrc: Oral Oral Oral Oral  Resp: 20 18 20 18   Height:      Weight:      SpO2: 100% 97% 99% 97%   General: Alert, awake, oriented x3, in no acute distress. HEENT: No bruits, no goiter. Heart: Regular rate and rhythm, without murmurs, rubs, gallops. Lungs: Clear to auscultation bilaterally. Abdomen: Soft, nontender, nondistended, positive bowel sounds. Extremities: No clubbing cyanosis or edema with positive  pedal pulses. Neuro: Grossly intact, nonfocal.   Discharge Instructions  Discharge Orders    Future Orders Please Complete By Expires   Diet - low sodium heart healthy      Increase activity slowly      Discharge instructions      Comments:   Please follow up with Urologist on 06/07/12 at 12:30 PM for follow up given recent mass on CT scan.  Their telephone number is [phone] please  call to confirm address and appointment. Also follow up with Gastroenterology in ~ 2 weeks.  Their telephone number is [phone] based on their notes they have scheduled you a follow up appointment call for confirmation.  You will need to get a follow up cbc    Call MD for:  temperature >100.4      Call MD for:  persistant dizziness or light-headedness        Medication List  As of 05/28/2012  9:11 AM   STOP taking these medications         aspirin 81 MG EC tablet      hydrochlorothiazide 12.5 MG capsule      hydrochlorothiazide 12.5 MG tablet      sildenafil 100 MG tablet         TAKE these medications         atorvastatin 40 MG tablet   Commonly known as: LIPITOR   TAKE 1 TABLET ONCE DAILY      Cinnamon 500 MG Tabs   Take by mouth daily.      CO Q 10 PO   Take by mouth daily.      diltiazem 300 MG 24 hr capsule   Commonly known as: CARDIZEM CD   TAKE 1 CAPSULE ONCE DAILY      fish oil-omega-3 fatty acids 1000 MG capsule   Take 2 g by mouth daily.      FLAX SEED OIL PO   Take by mouth daily.      multivitamin with minerals tablet   Take 1 tablet by mouth daily.      terazosin 2 MG capsule   Commonly known as: HYTRIN   TAKE 2 CAPSULES ONCE DAILY      ZYRTEC 10 MG tablet   Generic drug: cetirizine   Take 10 mg by mouth daily.              The results of significant diagnostics from this hospitalization (including imaging, microbiology, ancillary and laboratory) are listed below for reference.    Significant Diagnostic Studies: Ct Abdomen Pelvis W Contrast  05/25/2012  *RADIOLOGY REPORT*  Clinical Data: Rectal bleeding.  CT ABDOMEN AND PELVIS WITH CONTRAST  Technique:  Multidetector CT imaging of the abdomen and pelvis was performed following the standard protocol during bolus administration of intravenous contrast.  Contrast: OMNIPAQUE IOHEXOL 300 MG/ML  SOLN  Comparison: None.  Findings: Rounded mass containing heterogeneous calcification and  possible areas of enhancement in the anterior aspect of the mid to upper portion of the right kidney.  This measures 2.2 x 2.1 cm in maximum dimensions on image number 21.  The noncalcified portions of the mass measure 50 HU in minimum density.  Prominent stool in the colon with no colon masses or mucosal abnormalities seen.  Normal appearing appendix in the right lower abdomen.  No gastric or small bowel abnormalities seen.  Small pericardial effusion measuring 1.5 cm in maximum thickness.  Linear density in the right lower lobe.  8 mm left lobe  liver cyst on image #5 and 6 mm right lobe liver cyst on image number 14.  Unremarkable spleen, pancreas, adrenal glands, left kidney and urinary bladder.  Markedly enlarged prostate gland.  Small left inguinal hernia containing fat.  No enlarged lymph nodes.  Mildly enlarged heart.  Minimal right pleural fluid.  Lumbar and lower thoracic spine degenerative changes.  IMPRESSION:  1.  2.2 cm calcified right renal mass. This is compatible with a renal neoplasm, possibly malignant. 2.  Prominent stool throughout the colon with no visible mass. 3.  Right lower lobe linear atelectasis or scarring. 4.  Markedly enlarged prostate gland. 5.  Small left inguinal hernia containing fat. 6.  Small pericardial effusion.  Original Report Authenticated By: Darrol Angel, M.D.    Microbiology: No results found for this or any previous visit (from the past 240 hour(s)).   Labs: Basic Metabolic Panel:  Lab 05/26/12 4540 05/25/12 2130  NA 138 142  K 3.6 3.7  CL 105 105  CO2 26 24  GLUCOSE 109* 124*  BUN 20 20  CREATININE 0.94 1.08  CALCIUM 8.3* 9.2  MG -- --  PHOS -- --   Liver Function Tests:  Lab 05/26/12 0600 05/25/12 2130  AST 24 34  ALT 18 24  ALKPHOS 34* 45  BILITOT 0.5 0.5  PROT 5.3* 6.8  ALBUMIN 3.4* 4.4   No results found for this basename: LIPASE:5,AMYLASE:5 in the last 168 hours No results found for this basename: AMMONIA:5 in the last 168  hours CBC:  Lab 05/28/12 0820 05/27/12 1959 05/27/12 1100 05/26/12 1758 05/26/12 0600 05/25/12 2130  WBC -- -- -- -- 4.7 5.5  NEUTROABS -- -- -- -- -- 3.9  HGB 9.4* 9.2* 10.1* 10.0* 10.0* --  HCT 28.7* 28.6* 31.4* 30.9* 30.6* --  MCV -- -- -- -- 83.2 83.7  PLT -- -- -- -- 157 185   Cardiac Enzymes: No results found for this basename: CKTOTAL:5,CKMB:5,CKMBINDEX:5,TROPONINI:5 in the last 168 hours BNP: No components found with this basename: POCBNP:5 CBG: No results found for this basename: GLUCAP:5 in the last 168 hours  Time coordinating discharge: > 35 minutes placing orders, setting up f/u appointments, medical decision making, documenting.  Signed:  Penny Pia  [facility] 05/28/2012, 9:11 AM

## 2012-05-28 NOTE — Progress Notes (Signed)
CARE MANAGEMENT NOTE 05/28/2012  Patient:  [name] [name], [name] [name]   Account Number:  [id number]  Date Initiated:  05/28/2012  Documentation initiated by:  Jaquarius Seder  Subjective/Objective Assessment:   pt with lower gi bleed hgbmonitored, positive stools for bld, bloody stools x3     Action/Plan:   lives at home   Anticipated DC Date:  05/28/2012   Anticipated DC Plan:  HOME/SELF CARE  In-house referral  NA      DC Planning Services  NA      [facility] Choice  NA   Choice offered to / List presented to:  NA   DME arranged  NA      DME agency  NA     HH arranged  NA      HH agency  NA   Status of service:  In process, will continue to follow Medicare Important Message given?  NA - LOS <3 / Initial given by admissions (If response is "NO", the following Medicare IM given date fields will be blank) Date Medicare IM given:   Date Additional Medicare IM given:    Discharge Disposition:  HOME/SELF CARE  Per UR Regulation:  Reviewed for med. necessity/level of care/duration of stay  If discussed at Long Length of Stay Meetings, dates discussed:    Comments:  [id number]/Binyamin Nelis Stark Jock, BSN, CCM: [phone] Case Management Chart review for utilization review. No discharge need present at time of this review.

## 2012-05-28 NOTE — Progress Notes (Signed)
Discharge instructions given to pt, verbalized understanding. Left the unit in stable condition. 

## 2012-06-21 ENCOUNTER — Other Ambulatory Visit (INDEPENDENT_AMBULATORY_CARE_PROVIDER_SITE_OTHER): Payer: Medicare Other

## 2012-06-21 ENCOUNTER — Ambulatory Visit (INDEPENDENT_AMBULATORY_CARE_PROVIDER_SITE_OTHER): Payer: Medicare Other | Admitting: Gastroenterology

## 2012-06-21 ENCOUNTER — Encounter: Payer: Self-pay | Admitting: Gastroenterology

## 2012-06-21 VITALS — BP 140/76 | HR 68 | Ht 72.0 in | Wt 201.0 lb

## 2012-06-21 DIAGNOSIS — D649 Anemia, unspecified: Secondary | ICD-10-CM

## 2012-06-21 DIAGNOSIS — K5730 Diverticulosis of large intestine without perforation or abscess without bleeding: Secondary | ICD-10-CM

## 2012-06-21 DIAGNOSIS — N2889 Other specified disorders of kidney and ureter: Secondary | ICD-10-CM

## 2012-06-21 DIAGNOSIS — N289 Disorder of kidney and ureter, unspecified: Secondary | ICD-10-CM

## 2012-06-21 LAB — CBC WITH DIFFERENTIAL/PLATELET
Basophils Absolute: 0 10*3/uL (ref 0.0–0.1)
Basophils Relative: 0.9 % (ref 0.0–3.0)
Eosinophils Absolute: 0.2 10*3/uL (ref 0.0–0.7)
Eosinophils Relative: 5.8 % — ABNORMAL HIGH (ref 0.0–5.0)
HCT: 33.4 % — ABNORMAL LOW (ref 39.0–52.0)
Hemoglobin: 10.6 g/dL — ABNORMAL LOW (ref 13.0–17.0)
Lymphocytes Relative: 26 % (ref 12.0–46.0)
Lymphs Abs: 0.8 10*3/uL (ref 0.7–4.0)
MCHC: 31.6 g/dL (ref 30.0–36.0)
MCV: 83.6 fl (ref 78.0–100.0)
Monocytes Absolute: 0.4 10*3/uL (ref 0.1–1.0)
Monocytes Relative: 14.1 % — ABNORMAL HIGH (ref 3.0–12.0)
Neutro Abs: 1.6 10*3/uL (ref 1.4–7.7)
Neutrophils Relative %: 53.2 % (ref 43.0–77.0)
Platelets: 213 10*3/uL (ref 150.0–400.0)
RBC: 4 Mil/uL — ABNORMAL LOW (ref 4.22–5.81)
RDW: 13.9 % (ref 11.5–14.6)
WBC: 3 10*3/uL — ABNORMAL LOW (ref 4.5–10.5)

## 2012-06-21 MED ORDER — INTEGRA 62.5-62.5-40-3 MG PO CAPS: 1.0000 | ORAL_CAPSULE | Freq: Every day | ORAL | Status: DC

## 2012-06-21 NOTE — Progress Notes (Signed)
History of Present Illness:  This is a very pleasant 77 year old African American male hospitalized one month ago with sudden lower GI bleeding felt secondary to diverticulosis. He responded to conservative therapy in the hospital. He had colonoscopy one year ago that was unremarkable except for diverticulosis. CT scan during his hospitalization confirmed diverticulosis, and he had a 5 cm right renal mass which is being evaluated by urology, and apparently they think that his mass may be malignant, and the patient is currently deciding several urologic options presented to him. He denies any genitourinary or GI complaints at this time. His appetite is good and his weight is stable and he has normal colored stools. He does not currently smoke, abuse ethanol or take NSAIDs.  I have reviewed this patient's present history, medical and surgical past history, allergies and medications.     ROS: The remainder of the 10 point ROS is negative.... no cardiovascular, pulmonary, neurological symptoms. He specifically denies acid reflux or dysphagia. Family history is noncontributory.     Physical Exam: Blood pressure 140/76, pulse 60 and regular, weight 201 pounds with a BMI of 27.26. General well developed well nourished patient in no acute distress, appearing their stated age Eyes PERRLA, no icterus, fundoscopic exam per opthamologist Skin no lesions noted Neck supple, no adenopathy, no thyroid enlargement, no tenderness Chest clear to percussion and auscultation Heart no significant murmurs, gallops or rubs noted Abdomen no hepatosplenomegaly masses or tenderness, BS normal.  Extremities no acute joint lesions, edema, phlebitis or evidence of cellulitis. Neurologic patient oriented x 3, cranial nerves intact, no focal neurologic deficits noted. Psychological mental status normal and normal affect.  Assessment and plan: Status post recent lower GI hemorrhage from diverticulosis. He currently is  asymptomatic in terms of GI complaints. I have recommended a high fiber diet with daily Metamucil and liberal by mouth fluids and when necessary MiraLax if needed. We will repeat his CBC and anemia profile, I have started oral iron replacement therapy, and we will repeat a CBC in 6 weeks. He is to continue with his urologic followup as suggested by urology. We had a discussion today concerning diverticulosis and is management. CT scans, colonoscopy reports, labs, and multiple clinic and hospital notes reviewed today. It is my clinical opinion the patient should probably have biopsy of this right renal mass and possible surgery. If he has further GI bleeding we will reassess his situation.  Encounter Diagnosis  Name Primary?  Marland Kitchen Anemia Yes

## 2012-06-21 NOTE — Patient Instructions (Addendum)
We have sent the following medications to your pharmacy for you to pick up at your convenience: Integra 1 tablet daily. Your physician has requested that you go to the basement for the following lab work before leaving today: CBC Please have your blood work completed again on 08/02/12 for follow up of your anemia after taking Iron supplements.  Diverticulosis Diverticulosis is a common condition that develops when small pouches (diverticula) form in the wall of the colon. The risk of diverticulosis increases with age. It happens more often in people who eat a low-fiber diet. Most individuals with diverticulosis have no symptoms. Those individuals with symptoms usually experience abdominal pain, constipation, or loose stools (diarrhea). HOME CARE INSTRUCTIONS   Increase the amount of fiber in your diet as directed by your caregiver or dietician. This may reduce symptoms of diverticulosis.   Your caregiver may recommend taking a dietary fiber supplement.   Drink at least 6 to 8 glasses of water each day to prevent constipation.   Try not to strain when you have a bowel movement.   Your caregiver may recommend avoiding nuts and seeds to prevent complications, although this is still an uncertain benefit.   Only take over-the-counter or prescription medicines for pain, discomfort, or fever as directed by your caregiver.  FOODS WITH HIGH FIBER CONTENT INCLUDE:  Fruits. Apple, peach, pear, tangerine, raisins, prunes.   Vegetables. Brussels sprouts, asparagus, broccoli, cabbage, carrot, cauliflower, romaine lettuce, spinach, summer squash, tomato, winter squash, zucchini.   Starchy Vegetables. Baked beans, kidney beans, lima beans, split peas, lentils, potatoes (with skin).   Grains. Whole wheat bread, brown rice, bran flake cereal, plain oatmeal, white rice, shredded wheat, bran muffins.  SEEK IMMEDIATE MEDICAL CARE IF:   You develop increasing pain or severe bloating.   You have an oral  temperature above 102 F (38.9 C), not controlled by medicine.   You develop vomiting or bowel movements that are bloody or black.  Document Released: 09/08/2004 Document Revised: 12/01/2011 Document Reviewed: 05/12/2010 [facility] Patient Information 2012 [place], [place]. CC:Dr Gershon Crane

## 2012-06-22 ENCOUNTER — Other Ambulatory Visit: Payer: Self-pay | Admitting: Gastroenterology

## 2012-06-22 MED ORDER — INTEGRA 62.5-62.5-40-3 MG PO CAPS: 1.0000 | ORAL_CAPSULE | Freq: Every day | ORAL | Status: DC

## 2012-08-02 ENCOUNTER — Other Ambulatory Visit (INDEPENDENT_AMBULATORY_CARE_PROVIDER_SITE_OTHER): Payer: Medicare Other

## 2012-08-02 DIAGNOSIS — D649 Anemia, unspecified: Secondary | ICD-10-CM

## 2012-08-02 LAB — CBC WITH DIFFERENTIAL/PLATELET
Basophils Absolute: 0 10*3/uL (ref 0.0–0.1)
Basophils Relative: 0.5 % (ref 0.0–3.0)
Eosinophils Absolute: 0.2 10*3/uL (ref 0.0–0.7)
Eosinophils Relative: 8.1 % — ABNORMAL HIGH (ref 0.0–5.0)
HCT: 41 % (ref 39.0–52.0)
Hemoglobin: 13.2 g/dL (ref 13.0–17.0)
Lymphocytes Relative: 39.6 % (ref 12.0–46.0)
Lymphs Abs: 1.1 10*3/uL (ref 0.7–4.0)
MCHC: 32.3 g/dL (ref 30.0–36.0)
MCV: 82.1 fl (ref 78.0–100.0)
Monocytes Absolute: 0.3 10*3/uL (ref 0.1–1.0)
Monocytes Relative: 10.7 % (ref 3.0–12.0)
Neutro Abs: 1.2 10*3/uL — ABNORMAL LOW (ref 1.4–7.7)
Neutrophils Relative %: 41.1 % — ABNORMAL LOW (ref 43.0–77.0)
Platelets: 170 10*3/uL (ref 150.0–400.0)
RBC: 4.99 Mil/uL (ref 4.22–5.81)
RDW: 15.8 % — ABNORMAL HIGH (ref 11.5–14.6)
WBC: 2.9 10*3/uL — ABNORMAL LOW (ref 4.5–10.5)

## 2012-08-22 ENCOUNTER — Telehealth: Payer: Self-pay | Admitting: Family Medicine

## 2012-08-22 DIAGNOSIS — I10 Essential (primary) hypertension: Secondary | ICD-10-CM

## 2012-08-22 DIAGNOSIS — N138 Other obstructive and reflux uropathy: Secondary | ICD-10-CM

## 2012-08-22 NOTE — Telephone Encounter (Signed)
Lab orders are in

## 2012-10-05 ENCOUNTER — Other Ambulatory Visit: Payer: Self-pay | Admitting: Family Medicine

## 2012-10-11 ENCOUNTER — Other Ambulatory Visit: Payer: Self-pay | Admitting: Family Medicine

## 2012-10-12 ENCOUNTER — Other Ambulatory Visit (INDEPENDENT_AMBULATORY_CARE_PROVIDER_SITE_OTHER): Payer: Medicare Other

## 2012-10-12 DIAGNOSIS — I10 Essential (primary) hypertension: Secondary | ICD-10-CM

## 2012-10-12 DIAGNOSIS — N401 Enlarged prostate with lower urinary tract symptoms: Secondary | ICD-10-CM

## 2012-10-12 DIAGNOSIS — N139 Obstructive and reflux uropathy, unspecified: Secondary | ICD-10-CM

## 2012-10-12 DIAGNOSIS — N138 Other obstructive and reflux uropathy: Secondary | ICD-10-CM

## 2012-10-12 LAB — CBC WITH DIFFERENTIAL/PLATELET
Basophils Absolute: 0 10*3/uL (ref 0.0–0.1)
Basophils Relative: 0.7 % (ref 0.0–3.0)
Eosinophils Absolute: 0.2 10*3/uL (ref 0.0–0.7)
Eosinophils Relative: 7.7 % — ABNORMAL HIGH (ref 0.0–5.0)
HCT: 44.2 % (ref 39.0–52.0)
Hemoglobin: 14.1 g/dL (ref 13.0–17.0)
Lymphocytes Relative: 42.7 % (ref 12.0–46.0)
Lymphs Abs: 1.3 10*3/uL (ref 0.7–4.0)
MCHC: 32 g/dL (ref 30.0–36.0)
MCV: 80.8 fl (ref 78.0–100.0)
Monocytes Absolute: 0.3 10*3/uL (ref 0.1–1.0)
Monocytes Relative: 11.5 % (ref 3.0–12.0)
Neutro Abs: 1.1 10*3/uL — ABNORMAL LOW (ref 1.4–7.7)
Neutrophils Relative %: 37.4 % — ABNORMAL LOW (ref 43.0–77.0)
Platelets: 220 10*3/uL (ref 150.0–400.0)
RBC: 5.46 Mil/uL (ref 4.22–5.81)
RDW: 15.4 % — ABNORMAL HIGH (ref 11.5–14.6)
WBC: 3 10*3/uL — ABNORMAL LOW (ref 4.5–10.5)

## 2012-10-12 LAB — BASIC METABOLIC PANEL
BUN: 10 mg/dL (ref 6–23)
CO2: 29 mEq/L (ref 19–32)
Calcium: 9.4 mg/dL (ref 8.4–10.5)
Chloride: 105 mEq/L (ref 96–112)
Creatinine, Ser: 0.9 mg/dL (ref 0.4–1.5)
GFR: 100.59 mL/min (ref >60.00)
Glucose, Bld: 100 mg/dL — ABNORMAL HIGH (ref 70–99)
Potassium: 4.6 mEq/L (ref 3.5–5.1)
Sodium: 140 mEq/L (ref 135–145)

## 2012-10-12 LAB — POCT URINALYSIS DIPSTICK
Bilirubin, UA: NEGATIVE
Blood, UA: NEGATIVE
Glucose, UA: NEGATIVE
Ketones, UA: NEGATIVE
Leukocytes, UA: NEGATIVE
Nitrite, UA: NEGATIVE
Protein, UA: NEGATIVE
Spec Grav, UA: 1.02
Urobilinogen, UA: 0.2
pH, UA: 7

## 2012-10-12 LAB — TSH: TSH: 1.61 u[IU]/mL (ref 0.35–5.50)

## 2012-10-12 LAB — HEPATIC FUNCTION PANEL
ALT: 17 U/L (ref 0–53)
AST: 23 U/L (ref 0–37)
Albumin: 4.2 g/dL (ref 3.5–5.2)
Alkaline Phosphatase: 46 U/L (ref 39–117)
Bilirubin, Direct: 0.1 mg/dL (ref 0.0–0.3)
Total Bilirubin: 0.6 mg/dL (ref 0.3–1.2)
Total Protein: 6.3 g/dL (ref 6.0–8.3)

## 2012-10-12 LAB — LIPID PANEL
Cholesterol: 216 mg/dL — ABNORMAL HIGH (ref 0–200)
HDL: 72 mg/dL (ref >39.00)
Total CHOL/HDL Ratio: 3
Triglycerides: 66 mg/dL (ref 0.0–149.0)
VLDL: 13.2 mg/dL (ref 0.0–40.0)

## 2012-10-12 LAB — LDL CHOLESTEROL, DIRECT: Direct LDL: 106.9 mg/dL

## 2012-10-12 LAB — PSA: PSA: 0.71 ng/mL (ref 0.10–4.00)

## 2012-10-12 NOTE — Progress Notes (Signed)
Quick Note:  I spoke with pt ______ 

## 2012-10-22 ENCOUNTER — Encounter: Payer: Self-pay | Admitting: Family Medicine

## 2012-10-22 ENCOUNTER — Ambulatory Visit (INDEPENDENT_AMBULATORY_CARE_PROVIDER_SITE_OTHER): Payer: Medicare Other | Admitting: Family Medicine

## 2012-10-22 VITALS — BP 138/78 | HR 83 | Temp 98.5°F | Ht 72.0 in | Wt 192.0 lb

## 2012-10-22 DIAGNOSIS — Z0000 Encounter for general adult medical examination without abnormal findings: Secondary | ICD-10-CM

## 2012-10-22 DIAGNOSIS — Z23 Encounter for immunization: Secondary | ICD-10-CM

## 2012-10-22 DIAGNOSIS — N289 Disorder of kidney and ureter, unspecified: Secondary | ICD-10-CM

## 2012-10-22 MED ORDER — DILTIAZEM HCL ER COATED BEADS 300 MG PO CP24: 300.0000 mg | ORAL_CAPSULE | Freq: Every day | ORAL | Status: DC

## 2012-10-22 MED ORDER — TERAZOSIN HCL 2 MG PO CAPS: 4.0000 mg | ORAL_CAPSULE | Freq: Every day | ORAL | Status: DC

## 2012-10-22 MED ORDER — DIAZEPAM 5 MG PO TABS: 5.0000 mg | ORAL_TABLET | Freq: Three times a day (TID) | ORAL | Status: DC | PRN

## 2012-10-22 NOTE — Addendum Note (Signed)
Addended by: Aniceto Boss A on: 10/22/2012 03:35 PM   Modules accepted: Orders

## 2012-10-22 NOTE — Progress Notes (Signed)
  Subjective:    Patient ID: [name] [name], male    DOB: [birth year], 77 y.o.   MRN: [id number]  HPI 77 yr old male for a cpx. He feels fine and has no complaints. He had a brief diverticular bleed in May and was hospitalized from 05-25-12 to 05-31-12. This resolved spontaneously and he has had no further bleeding. His recent Hgb was back to normal at 14. As part of the workup during this time he had an abdominal CT which showed a 2cm x 2cm lesion on the upper pole of the right kidney which appeared to be a tumor. It was unclear if this is benign or malignant. He saw Dr. Retta Diones and of the options possible, they decided to simply follow this up with an MRI in about 4 months to see if this is stable.    Review of Systems  Constitutional: Negative.   HENT: Negative.   Eyes: Negative.   Respiratory: Negative.   Cardiovascular: Negative.   Gastrointestinal: Negative.   Genitourinary: Negative.   Musculoskeletal: Negative.   Skin: Negative.   Neurological: Negative.   Hematological: Negative.   Psychiatric/Behavioral: Negative.        Objective:   Physical Exam  Constitutional: He is oriented to person, place, and time. He appears well-developed and well-nourished. No distress.  HENT:  Head: Normocephalic and atraumatic.  Right Ear: External ear normal.  Left Ear: External ear normal.  Nose: Nose normal.  Mouth/Throat: Oropharynx is clear and moist. No oropharyngeal exudate.  Eyes: Conjunctivae normal and EOM are normal. Pupils are equal, round, and reactive to light. Right eye exhibits no discharge. Left eye exhibits no discharge. No scleral icterus.  Neck: Neck supple. No JVD present. No tracheal deviation present. No thyromegaly present.  Cardiovascular: Normal rate, regular rhythm, normal heart sounds and intact distal pulses.  Exam reveals no gallop and no friction rub.   No murmur heard. Pulmonary/Chest: Effort normal and breath sounds normal. No respiratory distress. He has no  wheezes. He has no rales. He exhibits no tenderness.  Abdominal: Soft. Bowel sounds are normal. He exhibits no distension and no mass. There is no tenderness. There is no rebound and no guarding.  Genitourinary: Rectum normal, prostate normal and penis normal. Guaiac negative stool. No penile tenderness.  Musculoskeletal: Normal range of motion. He exhibits no edema and no tenderness.  Lymphadenopathy:    He has no cervical adenopathy.  Neurological: He is alert and oriented to person, place, and time. He has normal reflexes. No cranial nerve deficit. He exhibits normal muscle tone. Coordination normal.  Skin: Skin is warm and dry. No rash noted. He is not diaphoretic. No erythema. No pallor.  Psychiatric: He has a normal mood and affect. His behavior is normal. Judgment and thought content normal.          Assessment & Plan:  Well exam. His is doing well. We will set up an abdominal MRI soon to follow up this renal lesion

## 2012-10-27 ENCOUNTER — Ambulatory Visit
Admission: RE | Admit: 2012-10-27 | Discharge: 2012-10-27 | Disposition: A | Discharge status: Home / Self Care | Payer: Medicare Other | Source: Ambulatory Visit | Attending: Family Medicine | Admitting: Family Medicine

## 2012-10-27 DIAGNOSIS — N289 Disorder of kidney and ureter, unspecified: Secondary | ICD-10-CM

## 2012-10-27 MED ORDER — GADOBENATE DIMEGLUMINE 529 MG/ML IV SOLN
18.0000 mL | Freq: Once | INTRAVENOUS | Status: AC | PRN
  Administered 2012-10-27: 18 mL via INTRAVENOUS

## 2012-10-30 NOTE — Progress Notes (Signed)
Quick Note:  I left a voice message with results. ______ 

## 2013-01-01 ENCOUNTER — Other Ambulatory Visit: Payer: Self-pay | Admitting: Family Medicine

## 2013-01-03 ENCOUNTER — Other Ambulatory Visit: Payer: Self-pay | Admitting: Family Medicine

## 2013-01-04 ENCOUNTER — Telehealth: Payer: Self-pay | Admitting: Family Medicine

## 2013-01-04 MED ORDER — DILTIAZEM HCL ER COATED BEADS 300 MG PO CP24: 300.0000 mg | ORAL_CAPSULE | Freq: Every day | ORAL | Status: DC

## 2013-01-04 NOTE — Telephone Encounter (Signed)
Refill request for Diltiazem ER 300 mg take 1 po qd ( 90 day supply ) and I did send e-scribe.

## 2013-04-29 ENCOUNTER — Telehealth: Payer: Self-pay | Admitting: Family Medicine

## 2013-04-29 NOTE — Telephone Encounter (Signed)
What is the diagnosis? What is the reason for this referral?

## 2013-04-29 NOTE — Telephone Encounter (Signed)
Pt needs referral to  Dr Blake Divine MD of [facility] [place], [place]. [facility] needs this referral in order to see pt.   [facility] [address] [place], [place]  [address] [phone]

## 2013-04-29 NOTE — Telephone Encounter (Signed)
Pls advise.  

## 2013-04-30 ENCOUNTER — Telehealth: Payer: Self-pay | Admitting: Family Medicine

## 2013-04-30 DIAGNOSIS — Z0000 Encounter for general adult medical examination without abnormal findings: Secondary | ICD-10-CM

## 2013-04-30 NOTE — Telephone Encounter (Signed)
The referral was done  

## 2013-04-30 NOTE — Telephone Encounter (Signed)
The reason is preventative and referral was made.

## 2013-05-29 ENCOUNTER — Other Ambulatory Visit: Payer: Self-pay | Admitting: Family Medicine

## 2013-07-11 ENCOUNTER — Encounter: Payer: Self-pay | Admitting: Family Medicine

## 2013-07-11 ENCOUNTER — Ambulatory Visit (INDEPENDENT_AMBULATORY_CARE_PROVIDER_SITE_OTHER): Payer: Medicare Other | Admitting: Family Medicine

## 2013-07-11 VITALS — BP 136/84 | HR 74 | Temp 97.7°F | Wt 200.0 lb

## 2013-07-11 DIAGNOSIS — M6740 Ganglion, unspecified site: Secondary | ICD-10-CM

## 2013-07-11 NOTE — Progress Notes (Signed)
  Subjective:    Patient ID: [name] [name], male    DOB: February 16, 1935, 78 y.o.   MRN: [id number]  HPI Here to ask about a lump he noticed on his left forearm one week ago. It is not tender and does not bother him.    Review of Systems  Constitutional: Negative.   Musculoskeletal: Negative.   Skin: Negative.        Objective:   Physical Exam  Constitutional: He appears well-developed and well-nourished.  Musculoskeletal:  Non-tender non-mobile firm nodule on the left forearm which is well beneath the skin           Assessment & Plan:  Forearm nodule which appears to be attached to the tendons, similar to a ganglion cyst. It seems to be benign, so he will observe and return if it starts to be symptomatic.

## 2013-09-16 ENCOUNTER — Ambulatory Visit (INDEPENDENT_AMBULATORY_CARE_PROVIDER_SITE_OTHER): Payer: Medicare Other | Admitting: Family Medicine

## 2013-09-16 DIAGNOSIS — Z23 Encounter for immunization: Secondary | ICD-10-CM

## 2013-09-18 ENCOUNTER — Ambulatory Visit: Payer: Medicare Other | Admitting: Family Medicine

## 2013-10-23 ENCOUNTER — Encounter: Payer: Medicare Other | Admitting: Family Medicine

## 2013-10-23 ENCOUNTER — Other Ambulatory Visit (INDEPENDENT_AMBULATORY_CARE_PROVIDER_SITE_OTHER): Payer: Medicare Other

## 2013-10-23 DIAGNOSIS — Z0000 Encounter for general adult medical examination without abnormal findings: Secondary | ICD-10-CM

## 2013-10-23 DIAGNOSIS — I10 Essential (primary) hypertension: Secondary | ICD-10-CM

## 2013-10-23 DIAGNOSIS — Z125 Encounter for screening for malignant neoplasm of prostate: Secondary | ICD-10-CM

## 2013-10-23 LAB — CBC WITH DIFFERENTIAL/PLATELET
Basophils Absolute: 0 10*3/uL (ref 0.0–0.1)
Basophils Relative: 0.7 % (ref 0.0–3.0)
Eosinophils Absolute: 0.3 10*3/uL (ref 0.0–0.7)
Eosinophils Relative: 9.5 % — ABNORMAL HIGH (ref 0.0–5.0)
HCT: 42.2 % (ref 39.0–52.0)
Hemoglobin: 13.8 g/dL (ref 13.0–17.0)
Lymphocytes Relative: 38.3 % (ref 12.0–46.0)
Lymphs Abs: 1.3 10*3/uL (ref 0.7–4.0)
MCHC: 32.7 g/dL (ref 30.0–36.0)
MCV: 82.6 fl (ref 78.0–100.0)
Monocytes Absolute: 0.4 10*3/uL (ref 0.1–1.0)
Monocytes Relative: 12.2 % — ABNORMAL HIGH (ref 3.0–12.0)
Neutro Abs: 1.3 10*3/uL — ABNORMAL LOW (ref 1.4–7.7)
Neutrophils Relative %: 39.3 % — ABNORMAL LOW (ref 43.0–77.0)
Platelets: 210 10*3/uL (ref 150.0–400.0)
RBC: 5.1 Mil/uL (ref 4.22–5.81)
RDW: 13.8 % (ref 11.5–14.6)
WBC: 3.4 10*3/uL — ABNORMAL LOW (ref 4.5–10.5)

## 2013-10-23 LAB — BASIC METABOLIC PANEL
BUN: 10 mg/dL (ref 6–23)
CO2: 30 mEq/L (ref 19–32)
Calcium: 9.6 mg/dL (ref 8.4–10.5)
Chloride: 105 mEq/L (ref 96–112)
Creatinine, Ser: 1 mg/dL (ref 0.4–1.5)
GFR: 93.4 mL/min (ref >60.00)
Glucose, Bld: 105 mg/dL — ABNORMAL HIGH (ref 70–99)
Potassium: 4.6 mEq/L (ref 3.5–5.1)
Sodium: 141 mEq/L (ref 135–145)

## 2013-10-23 LAB — POCT URINALYSIS DIPSTICK
Bilirubin, UA: NEGATIVE
Blood, UA: NEGATIVE
Glucose, UA: NEGATIVE
Ketones, UA: NEGATIVE
Leukocytes, UA: NEGATIVE
Nitrite, UA: NEGATIVE
Protein, UA: NEGATIVE
Spec Grav, UA: 1.015
Urobilinogen, UA: 0.2
pH, UA: 7

## 2013-10-23 LAB — HEPATIC FUNCTION PANEL
ALT: 16 U/L (ref 0–53)
AST: 22 U/L (ref 0–37)
Albumin: 4.3 g/dL (ref 3.5–5.2)
Alkaline Phosphatase: 42 U/L (ref 39–117)
Bilirubin, Direct: 0.1 mg/dL (ref 0.0–0.3)
Total Bilirubin: 0.8 mg/dL (ref 0.3–1.2)
Total Protein: 6.4 g/dL (ref 6.0–8.3)

## 2013-10-23 LAB — LIPID PANEL
Cholesterol: 253 mg/dL — ABNORMAL HIGH (ref 0–200)
HDL: 80 mg/dL (ref >39.00)
Total CHOL/HDL Ratio: 3
Triglycerides: 83 mg/dL (ref 0.0–149.0)
VLDL: 16.6 mg/dL (ref 0.0–40.0)

## 2013-10-23 LAB — TSH: TSH: 1.93 u[IU]/mL (ref 0.35–5.50)

## 2013-10-23 LAB — PSA: PSA: 0.81 ng/mL (ref 0.10–4.00)

## 2013-10-23 LAB — LDL CHOLESTEROL, DIRECT: Direct LDL: 154.3 mg/dL

## 2013-10-25 NOTE — Progress Notes (Signed)
Quick Note:  Pt has appointment on 10/30/13 will go over then. ______ 

## 2013-10-30 ENCOUNTER — Ambulatory Visit (INDEPENDENT_AMBULATORY_CARE_PROVIDER_SITE_OTHER): Payer: Medicare Other | Admitting: Family Medicine

## 2013-10-30 ENCOUNTER — Encounter: Payer: Self-pay | Admitting: Family Medicine

## 2013-10-30 VITALS — BP 130/84 | HR 81 | Temp 97.7°F | Ht 71.25 in | Wt 200.0 lb

## 2013-10-30 DIAGNOSIS — N2889 Other specified disorders of kidney and ureter: Secondary | ICD-10-CM

## 2013-10-30 DIAGNOSIS — N289 Disorder of kidney and ureter, unspecified: Secondary | ICD-10-CM

## 2013-10-30 DIAGNOSIS — Z0000 Encounter for general adult medical examination without abnormal findings: Secondary | ICD-10-CM

## 2013-10-30 DIAGNOSIS — I10 Essential (primary) hypertension: Secondary | ICD-10-CM

## 2013-10-30 MED ORDER — SILDENAFIL CITRATE 100 MG PO TABS: ORAL_TABLET | ORAL | Status: DC

## 2013-10-30 NOTE — Progress Notes (Signed)
  Subjective:    Patient ID: [name] [name], male    DOB: [birth year], 78 y.o.   MRN: [id number]  HPI 78 yr old male for a cpx. He feels fine. We had been following a mass on the right kidney which was last looked at in October 2013. Our recommendation at that time was to have a 6 month follow up scan, but he declined that.    Review of Systems  Constitutional: Negative.   HENT: Negative.   Eyes: Negative.   Respiratory: Negative.   Cardiovascular: Negative.   Gastrointestinal: Negative.   Genitourinary: Negative.   Musculoskeletal: Negative.   Skin: Negative.   Neurological: Negative.   Psychiatric/Behavioral: Negative.        Objective:   Physical Exam  Constitutional: He is oriented to person, place, and time. He appears well-developed and well-nourished. No distress.  HENT:  Head: Normocephalic and atraumatic.  Right Ear: External ear normal.  Left Ear: External ear normal.  Nose: Nose normal.  Mouth/Throat: Oropharynx is clear and moist. No oropharyngeal exudate.  Eyes: Conjunctivae and EOM are normal. Pupils are equal, round, and reactive to light. Right eye exhibits no discharge. Left eye exhibits no discharge. No scleral icterus.  Neck: Neck supple. No JVD present. No tracheal deviation present. No thyromegaly present.  Cardiovascular: Normal rate, regular rhythm, normal heart sounds and intact distal pulses.  Exam reveals no gallop and no friction rub.   No murmur heard. EKG shows stable RBBB  Pulmonary/Chest: Effort normal and breath sounds normal. No respiratory distress. He has no wheezes. He has no rales. He exhibits no tenderness.  Abdominal: Soft. Bowel sounds are normal. He exhibits no distension and no mass. There is no tenderness. There is no rebound and no guarding.  Genitourinary: Rectum normal, prostate normal and penis normal. Guaiac negative stool. No penile tenderness.  Musculoskeletal: Normal range of motion. He exhibits no edema and no tenderness.   Lymphadenopathy:    He has no cervical adenopathy.  Neurological: He is alert and oriented to person, place, and time. He has normal reflexes. No cranial nerve deficit. He exhibits normal muscle tone. Coordination normal.  Skin: Skin is warm and dry. No rash noted. He is not diaphoretic. No erythema. No pallor.  Psychiatric: He has a normal mood and affect. His behavior is normal. Judgment and thought content normal.          Assessment & Plan:  Well exam. After discussing his options today, he has decided to go ahead with another scan, so we will set up a renal MRI soon.

## 2013-11-02 ENCOUNTER — Other Ambulatory Visit: Payer: Self-pay | Admitting: Family Medicine

## 2013-11-26 ENCOUNTER — Telehealth: Payer: Self-pay | Admitting: Family Medicine

## 2013-11-26 MED ORDER — DILTIAZEM HCL ER COATED BEADS 300 MG PO CP24: 300.0000 mg | ORAL_CAPSULE | Freq: Every day | ORAL | Status: DC

## 2013-11-26 NOTE — Telephone Encounter (Signed)
Refill request for Cardizem and [facility] to [facility], which I did send script e-scribe.

## 2013-12-05 ENCOUNTER — Telehealth: Payer: Self-pay

## 2013-12-05 NOTE — Telephone Encounter (Signed)
Pt walked in to clinic today with complaints of dizziness x 1 week.  Pt experiences this with getting in and out of bed, turning over in bed, and bending over to put on shoes and socks.  Pt denies any nausea or vomiting.  Pt is currently on BP medication and has been taking this regularly and has not missed a dose.  Pt's blood pressure was 138/80 in his left arm and his temperature was 97.8.  Spoke with Dr. Clent Ridges and it is ok for pt to come in on 12/06/13.  Pt is aware of appt on 12/06/13 at 11 am.

## 2013-12-06 ENCOUNTER — Ambulatory Visit (INDEPENDENT_AMBULATORY_CARE_PROVIDER_SITE_OTHER): Payer: Medicare Other | Admitting: Family Medicine

## 2013-12-06 ENCOUNTER — Encounter: Payer: Self-pay | Admitting: Family Medicine

## 2013-12-06 VITALS — BP 124/78 | HR 68 | Temp 98.2°F | Wt 210.0 lb

## 2013-12-06 DIAGNOSIS — H8110 Benign paroxysmal vertigo, unspecified ear: Secondary | ICD-10-CM

## 2013-12-06 MED ORDER — MECLIZINE HCL 25 MG PO TABS: 25.0000 mg | ORAL_TABLET | ORAL | Status: DC | PRN

## 2013-12-06 NOTE — Progress Notes (Signed)
Pre visit review using our clinic review tool, if applicable. No additional management support is needed unless otherwise documented below in the visit note. 

## 2013-12-06 NOTE — Progress Notes (Signed)
   Subjective:    Patient ID: [name] [name], male    DOB: 01/29/1935, 78 y.o.   MRN: [id number]  HPI Here for 2 days of dizziness which feels like the room is spinning when he stands up or moves side to side. He feels fine when still. No HA or other sx.    Review of Systems  Constitutional: Negative.   HENT: Negative.   Eyes: Negative.   Neurological: Positive for dizziness. Negative for tremors, seizures, syncope, facial asymmetry, speech difficulty, weakness, light-headedness, numbness and headaches.       Objective:   Physical Exam  Constitutional: He is oriented to person, place, and time. He appears well-developed and well-nourished.  HENT:  Left Ear: External ear normal.  Nose: Nose normal.  Mouth/Throat: Oropharynx is clear and moist.  Eyes: Conjunctivae and EOM are normal. Pupils are equal, round, and reactive to light.  Neurological: He is alert and oriented to person, place, and time. He has normal reflexes. No cranial nerve deficit. He exhibits normal muscle tone. Coordination normal.          Assessment & Plan:  Rest, drink fluids. Try Meclizine

## 2014-05-06 ENCOUNTER — Telehealth: Payer: Self-pay | Admitting: Family Medicine

## 2014-05-06 NOTE — Telephone Encounter (Signed)
Pt mail order did not come and will not arrive until sometime next week. Would like to know if about 10 pills can be called in to [facility] on [address]

## 2014-05-06 NOTE — Telephone Encounter (Signed)
What is the medicine? 

## 2014-05-09 NOTE — Telephone Encounter (Signed)
Pt received mail order thanks

## 2014-08-01 ENCOUNTER — Telehealth: Payer: Self-pay | Admitting: Family Medicine

## 2014-08-01 NOTE — Telephone Encounter (Signed)
Open in error

## 2014-08-07 ENCOUNTER — Telehealth: Payer: Self-pay | Admitting: Family Medicine

## 2014-08-07 NOTE — Telephone Encounter (Signed)
Pt needs re-fill of terazosin (HYTRIN) 2 MG capsule  [facility] - [place], [place] - [address]

## 2014-08-08 MED ORDER — TERAZOSIN HCL 2 MG PO CAPS: ORAL_CAPSULE | ORAL | Status: DC

## 2014-08-08 NOTE — Telephone Encounter (Signed)
I sent script e-scribe. 

## 2014-08-19 ENCOUNTER — Encounter (HOSPITAL_COMMUNITY): Payer: Self-pay | Admitting: Emergency Medicine

## 2014-08-19 ENCOUNTER — Inpatient Hospital Stay (HOSPITAL_COMMUNITY)
Admission: EM | Admit: 2014-08-19 | Discharge: 2014-08-20 | DRG: 392 | Disposition: A | Discharge status: Home / Self Care | Payer: Medicare Other | Attending: Internal Medicine | Admitting: Internal Medicine

## 2014-08-19 DIAGNOSIS — R7309 Other abnormal glucose: Secondary | ICD-10-CM | POA: Diagnosis present

## 2014-08-19 DIAGNOSIS — E785 Hyperlipidemia, unspecified: Secondary | ICD-10-CM | POA: Diagnosis present

## 2014-08-19 DIAGNOSIS — I10 Essential (primary) hypertension: Secondary | ICD-10-CM | POA: Diagnosis present

## 2014-08-19 DIAGNOSIS — K5731 Diverticulosis of large intestine without perforation or abscess with bleeding: Secondary | ICD-10-CM

## 2014-08-19 DIAGNOSIS — R109 Unspecified abdominal pain: Secondary | ICD-10-CM

## 2014-08-19 DIAGNOSIS — K5289 Other specified noninfective gastroenteritis and colitis: Secondary | ICD-10-CM | POA: Diagnosis not present

## 2014-08-19 DIAGNOSIS — A088 Other specified intestinal infections: Secondary | ICD-10-CM | POA: Diagnosis not present

## 2014-08-19 DIAGNOSIS — N400 Enlarged prostate without lower urinary tract symptoms: Secondary | ICD-10-CM | POA: Diagnosis present

## 2014-08-19 DIAGNOSIS — I479 Paroxysmal tachycardia, unspecified: Secondary | ICD-10-CM

## 2014-08-19 DIAGNOSIS — D62 Acute posthemorrhagic anemia: Secondary | ICD-10-CM

## 2014-08-19 DIAGNOSIS — N2889 Other specified disorders of kidney and ureter: Secondary | ICD-10-CM

## 2014-08-19 DIAGNOSIS — B009 Herpesviral infection, unspecified: Secondary | ICD-10-CM

## 2014-08-19 DIAGNOSIS — D721 Eosinophilia, unspecified: Secondary | ICD-10-CM

## 2014-08-19 DIAGNOSIS — N529 Male erectile dysfunction, unspecified: Secondary | ICD-10-CM | POA: Diagnosis present

## 2014-08-19 DIAGNOSIS — K529 Noninfective gastroenteritis and colitis, unspecified: Secondary | ICD-10-CM

## 2014-08-19 DIAGNOSIS — Z87891 Personal history of nicotine dependence: Secondary | ICD-10-CM

## 2014-08-19 DIAGNOSIS — J309 Allergic rhinitis, unspecified: Secondary | ICD-10-CM

## 2014-08-19 LAB — URINALYSIS, ROUTINE W REFLEX MICROSCOPIC
Bilirubin Urine: NEGATIVE
Glucose, UA: NEGATIVE mg/dL
Hgb urine dipstick: NEGATIVE
Ketones, ur: 40 mg/dL — AB
Leukocytes, UA: NEGATIVE
Nitrite: NEGATIVE
Protein, ur: NEGATIVE mg/dL
Specific Gravity, Urine: 1.02 (ref 1.005–1.030)
Urobilinogen, UA: 0.2 mg/dL (ref 0.0–1.0)
pH: 7.5 (ref 5.0–8.0)

## 2014-08-19 LAB — CBC WITH DIFFERENTIAL/PLATELET
Basophils Absolute: 0 K/uL (ref 0.0–0.1)
Basophils Relative: 0 % (ref 0–1)
Eosinophils Absolute: 0.1 K/uL (ref 0.0–0.7)
Eosinophils Relative: 1 % (ref 0–5)
HCT: 45.6 % (ref 39.0–52.0)
Hemoglobin: 15 g/dL (ref 13.0–17.0)
Lymphocytes Relative: 13 % (ref 12–46)
Lymphs Abs: 0.9 K/uL (ref 0.7–4.0)
MCH: 26.6 pg (ref 26.0–34.0)
MCHC: 32.9 g/dL (ref 30.0–36.0)
MCV: 80.9 fL (ref 78.0–100.0)
Monocytes Absolute: 0.4 K/uL (ref 0.1–1.0)
Monocytes Relative: 6 % (ref 3–12)
Neutro Abs: 5.5 K/uL (ref 1.7–7.7)
Neutrophils Relative %: 80 % — ABNORMAL HIGH (ref 43–77)
Platelets: 222 K/uL (ref 150–400)
RBC: 5.64 MIL/uL (ref 4.22–5.81)
RDW: 13.5 % (ref 11.5–15.5)
WBC: 7 K/uL (ref 4.0–10.5)

## 2014-08-19 LAB — COMPREHENSIVE METABOLIC PANEL
ALT: 14 U/L (ref 0–53)
AST: 27 U/L (ref 0–37)
Albumin: 4.8 g/dL (ref 3.5–5.2)
Alkaline Phosphatase: 51 U/L (ref 39–117)
Anion gap: 16 — ABNORMAL HIGH (ref 5–15)
BUN: 10 mg/dL (ref 6–23)
CO2: 27 mEq/L (ref 19–32)
Calcium: 10.2 mg/dL (ref 8.4–10.5)
Chloride: 97 mEq/L (ref 96–112)
Creatinine, Ser: 0.88 mg/dL (ref 0.50–1.35)
GFR calc Af Amer: >90 mL/min (ref >90)
GFR calc non Af Amer: 81 mL/min — ABNORMAL LOW (ref >90)
Glucose, Bld: 197 mg/dL — ABNORMAL HIGH (ref 70–99)
Potassium: 3.8 mEq/L (ref 3.7–5.3)
Sodium: 140 mEq/L (ref 137–147)
Total Bilirubin: 0.4 mg/dL (ref 0.3–1.2)
Total Protein: 7.9 g/dL (ref 6.0–8.3)

## 2014-08-19 LAB — LIPASE, BLOOD: Lipase: 17 U/L (ref 11–59)

## 2014-08-19 MED ORDER — SODIUM CHLORIDE 0.9 % IV SOLN
Freq: Once | INTRAVENOUS | Status: AC
  Administered 2014-08-19: via INTRAVENOUS

## 2014-08-19 MED ORDER — ONDANSETRON HCL 4 MG/2ML IJ SOLN
4.0000 mg | Freq: Once | INTRAMUSCULAR | Status: AC
  Administered 2014-08-19: 4 mg via INTRAMUSCULAR
  Filled 2014-08-19: qty 2

## 2014-08-19 MED ORDER — FENTANYL CITRATE 0.05 MG/ML IJ SOLN
50.0000 ug | Freq: Once | INTRAMUSCULAR | Status: AC
  Administered 2014-08-19: 50 ug via INTRAVENOUS
  Filled 2014-08-19: qty 2

## 2014-08-19 MED ORDER — SIMETHICONE 40 MG/0.6ML PO SUSP (UNIT DOSE)
40.0000 mg | Freq: Once | ORAL | Status: AC
  Administered 2014-08-19: 40 mg via ORAL
  Filled 2014-08-19: qty 0.6

## 2014-08-19 NOTE — ED Provider Notes (Signed)
CSN: [id number]     Arrival date & time 08/19/14  2046 History   First MD Initiated Contact with Patient 08/19/14 2301     Chief Complaint  Patient presents with  . Abdominal Pain     (Consider location/radiation/quality/duration/timing/severity/associated sxs/prior Treatment) HPI 79 year old male presents to emergency department with complaint of abdominal pain since around 4 PM today.  Pain is diffuse throughout his abdomen and extends into his back bilaterally.  He denies any nausea vomiting or diarrhea.  Normal bowel movement today.  No fever or chills.  No prior history of same.  Patient reports he feels bloated, but cannot pass gas. Past Medical History  Diagnosis Date  . Allergy   . Hyperlipidemia   . Hypertension   . Hyperglycemia   . ED (erectile dysfunction)   . Diverticulosis of colon (without mention of hemorrhage) 03-01-2004, 04-04-2011    Colonoscopy  . Internal hemorrhoids 03-15-1999    Flex Sig   . Irritable bowel    Past Surgical History  Procedure Laterality Date  . Inguinal hernia repair    . Knee arthroscopy      right knee  . Nasal sinus surgery    . Eye surgery      bilateral cataract extraction per Dr Jettie Pagan  . Colonoscopy  04-04-11    per Dr. Jarold Motto, clear, no repeats needed   Family History  Problem Relation Age of Onset  . Hypertension Mother   . Lung cancer Father   . Colitis Daughter    History  Substance Use Topics  . Smoking status: Former Smoker    Quit date: 03/20/1969  . Smokeless tobacco: Never Used  . Alcohol Use: 1.8 oz/week    3 Glasses of wine per week    Review of Systems   See History of Present Illness; otherwise all other systems are reviewed and negative  Allergies  Review of patient's allergies indicates no known allergies.  Home Medications   Prior to Admission medications   Medication Sig Start Date End Date Taking? Authorizing Provider  bimatoprost (LUMIGAN) 0.03 % ophthalmic solution Place 1 drop into both eyes at  bedtime.   Yes Historical Provider, MD  cetirizine (ZYRTEC) 10 MG tablet Take 10 mg by mouth daily.    Yes Historical Provider, MD  Cinnamon 500 MG TABS Take 500 mg by mouth daily.    Yes Historical Provider, MD  Coenzyme Q10 (CO Q 10 PO) Take 1 capsule by mouth 2 (two) times daily.    Yes Historical Provider, MD  diltiazem (CARDIZEM CD) 300 MG 24 hr capsule Take 1 capsule (300 mg total) by mouth daily. 11/26/13  Yes [name], MD  fish oil-omega-3 fatty acids 1000 MG capsule Take 1 g by mouth 3 (three) times daily.    Yes Historical Provider, MD  Flaxseed, Linseed, (FLAX SEED OIL PO) Take 1 capsule by mouth 3 (three) times daily.    Yes Historical Provider, MD  Glucosamine-Chondroitin (COSAMIN DS) 500-400 MG CAPS Take 1 tablet by mouth 2 (two) times daily.   Yes Historical Provider, MD  levobunolol (BETAGAN) 0.5 % ophthalmic solution Place 1 drop into both eyes daily.   Yes Historical Provider, MD  Multiple Vitamins-Minerals (MULTIVITAMIN WITH MINERALS) tablet Take 1 tablet by mouth 2 (two) times daily.    Yes Historical Provider, MD  sildenafil (VIAGRA) 100 MG tablet TAKE 1 TABLET (100 MG TOTAL) AS NEEDED FOR ERECTILE DYSFUNCTION 10/30/13  Yes [name], MD  terazosin (HYTRIN) 2 MG capsule  TAKE 2 CAPSULES (4 MG) AT BEDTIME 08/08/14  Yes [name], MD   BP 158/97  Pulse 83  Temp(Src) 97.4 F (36.3 C) (Oral)  Resp 18  SpO2 96% Physical Exam  Nursing note and vitals reviewed. Constitutional: He is oriented to person, place, and time. He appears well-developed and well-nourished. No distress.  HENT:  Head: Normocephalic and atraumatic.  Nose: Nose normal.  Mouth/Throat: Oropharynx is clear and moist.  Eyes: Conjunctivae and EOM are normal. Pupils are equal, round, and reactive to light.  Neck: Normal range of motion. Neck supple. No JVD present. No tracheal deviation present. No thyromegaly present.  Cardiovascular: Normal rate, regular rhythm, normal heart sounds and intact distal  pulses.  Exam reveals no gallop and no friction rub.   No murmur heard. Pulmonary/Chest: Effort normal and breath sounds normal. No stridor. No respiratory distress. He has no wheezes. He has no rales. He exhibits no tenderness.  Abdominal: Soft. Bowel sounds are normal. He exhibits no distension and no mass. There is tenderness (diffuse tenderness mild ). There is no rebound and no guarding.  Hyperactive bowel sounds  Musculoskeletal: Normal range of motion. He exhibits no edema and no tenderness.  Lymphadenopathy:    He has no cervical adenopathy.  Neurological: He is alert and oriented to person, place, and time. He exhibits normal muscle tone. Coordination normal.  Skin: Skin is warm and dry. No rash noted. No erythema. No pallor.  Psychiatric: He has a normal mood and affect. His behavior is normal. Judgment and thought content normal.    ED Course  Procedures (including critical care time) Labs Review Labs Reviewed  CBC WITH DIFFERENTIAL - Abnormal; Notable for the following:    Neutrophils Relative % 80 (*)    All other components within normal limits  COMPREHENSIVE METABOLIC PANEL - Abnormal; Notable for the following:    Glucose, Bld 197 (*)    GFR calc non Af Amer 81 (*)    Anion gap 16 (*)    All other components within normal limits  URINALYSIS, ROUTINE W REFLEX MICROSCOPIC - Abnormal; Notable for the following:    APPearance CLOUDY (*)    Ketones, ur 40 (*)    All other components within normal limits  LIPASE, BLOOD  I-STAT CG4 LACTIC ACID, ED    Imaging Review Ct Abdomen Pelvis W Contrast  08/20/2014   CLINICAL DATA:  Lower abdominal pain radiating to the back.  EXAM: CT ABDOMEN AND PELVIS WITH CONTRAST  TECHNIQUE: Multidetector CT imaging of the abdomen and pelvis was performed using the standard protocol following bolus administration of intravenous contrast.  CONTRAST:  OMNIPAQUE IOHEXOL 300 MG/ML  SOLN  COMPARISON:  05/25/2012  FINDINGS: Nodular scarring in  the right lung base appears unchanged. Slight fibrosis in the lung bases again demonstrated. Minimal pericardial effusion or thickening.  Small amount of free fluid inferior to the liver edge and extending along the right pericolic gutter down into the pelvis. Small amount of free fluid in the left upper quadrant mesenteric, extending down into the pelvis. Mildly dilated mid abdominal small bowel with mesenteric fluid present. Mild small bowel wall thickening. No free air. No portal venous gas or pneumatosis. Changes likely to indicate enteritis, possibly due to infectious or ischemic etiology. Perforation is fell less likely. Terminal ileum is decompressed and degree of obstruction could also be present. The visualized mesenteric arteries and veins appear patent. No obvious vascular insult. Colon is stool filled without distention or wall thickening.  Liver, spleen, gallbladder, pancreas, adrenal glands, abdominal aorta, inferior vena cava, and retroperitoneal lymph nodes are unremarkable. Heterogeneous calcified lesion in the upper pole of the right kidney is again demonstrated without change. No hydronephrosis in the kidneys.  Pelvis: Small amount of free fluid in the pelvis extending from inflammatory process in the abdomen/mesenteric. Prostate gland is diffusely enlarged, measuring 6.3 x 4.9 cm. Bladder is mostly decompressed. No pelvic mass or lymphadenopathy. Appendix is normal. No evidence of diverticulitis. Degenerative changes in the spine. No destructive bone lesions identified.  IMPRESSION: Mid abdominal small bowel dilatation with fluid-filled loops. Mild small bowel wall thickening. Mesenteric edema with small amount of free fluid in the abdomen and pelvis. Changes likely to represent enteritis, either infectious or inflammatory or ischemic etiology. No definitive vascular disease. No pneumatosis or portal venous gas.  These results were called by telephone at the time of interpretation on 08/20/2014 at  1:58 am to Dr. Marisa Severin , who verbally acknowledged these results.   Electronically Signed   By: Burman Nieves M.D.   On: 08/20/2014 02:01     EKG Interpretation None      MDM   Final diagnoses:  Enteritis     79 year old male with diffuse lower abdominal pain extending into his back.  Labs show ketones in urine, otherwise unremarkable.  He has mild tenderness on exam.  Plan for CT scan.    Olivia Mackie, MD 08/20/14 [phone]

## 2014-08-19 NOTE — ED Notes (Signed)
Pt states that he began having BLQ ABD pain earlier today. States that the pain radiates around to his back.

## 2014-08-20 ENCOUNTER — Emergency Department (HOSPITAL_COMMUNITY): Payer: Medicare Other

## 2014-08-20 ENCOUNTER — Encounter (HOSPITAL_COMMUNITY): Payer: Self-pay

## 2014-08-20 DIAGNOSIS — K5289 Other specified noninfective gastroenteritis and colitis: Secondary | ICD-10-CM

## 2014-08-20 DIAGNOSIS — A088 Other specified intestinal infections: Secondary | ICD-10-CM | POA: Diagnosis present

## 2014-08-20 DIAGNOSIS — R109 Unspecified abdominal pain: Secondary | ICD-10-CM

## 2014-08-20 DIAGNOSIS — I10 Essential (primary) hypertension: Secondary | ICD-10-CM | POA: Diagnosis present

## 2014-08-20 DIAGNOSIS — E785 Hyperlipidemia, unspecified: Secondary | ICD-10-CM | POA: Diagnosis present

## 2014-08-20 DIAGNOSIS — K529 Noninfective gastroenteritis and colitis, unspecified: Secondary | ICD-10-CM | POA: Diagnosis present

## 2014-08-20 DIAGNOSIS — N529 Male erectile dysfunction, unspecified: Secondary | ICD-10-CM | POA: Diagnosis present

## 2014-08-20 DIAGNOSIS — R7309 Other abnormal glucose: Secondary | ICD-10-CM | POA: Diagnosis present

## 2014-08-20 DIAGNOSIS — Z87891 Personal history of nicotine dependence: Secondary | ICD-10-CM | POA: Diagnosis not present

## 2014-08-20 DIAGNOSIS — N400 Enlarged prostate without lower urinary tract symptoms: Secondary | ICD-10-CM | POA: Diagnosis present

## 2014-08-20 LAB — LIPASE, BLOOD: Lipase: 13 U/L (ref 11–59)

## 2014-08-20 LAB — CBC WITH DIFFERENTIAL/PLATELET
Basophils Absolute: 0 10*3/uL (ref 0.0–0.1)
Basophils Relative: 0 % (ref 0–1)
Eosinophils Absolute: 0 10*3/uL (ref 0.0–0.7)
Eosinophils Relative: 0 % (ref 0–5)
HCT: 43.2 % (ref 39.0–52.0)
Hemoglobin: 14 g/dL (ref 13.0–17.0)
Lymphocytes Relative: 7 % — ABNORMAL LOW (ref 12–46)
Lymphs Abs: 0.5 10*3/uL — ABNORMAL LOW (ref 0.7–4.0)
MCH: 26.6 pg (ref 26.0–34.0)
MCHC: 32.4 g/dL (ref 30.0–36.0)
MCV: 82 fL (ref 78.0–100.0)
Monocytes Absolute: 0.5 10*3/uL (ref 0.1–1.0)
Monocytes Relative: 7 % (ref 3–12)
Neutro Abs: 6.5 10*3/uL (ref 1.7–7.7)
Neutrophils Relative %: 86 % — ABNORMAL HIGH (ref 43–77)
Platelets: 175 10*3/uL (ref 150–400)
RBC: 5.27 MIL/uL (ref 4.22–5.81)
RDW: 13.4 % (ref 11.5–15.5)
WBC: 7.6 10*3/uL (ref 4.0–10.5)

## 2014-08-20 LAB — COMPREHENSIVE METABOLIC PANEL
ALT: 12 U/L (ref 0–53)
AST: 21 U/L (ref 0–37)
Albumin: 3.7 g/dL (ref 3.5–5.2)
Alkaline Phosphatase: 42 U/L (ref 39–117)
Anion gap: 11 (ref 5–15)
BUN: 10 mg/dL (ref 6–23)
CO2: 27 mEq/L (ref 19–32)
Calcium: 9.1 mg/dL (ref 8.4–10.5)
Chloride: 98 mEq/L (ref 96–112)
Creatinine, Ser: 0.87 mg/dL (ref 0.50–1.35)
GFR calc Af Amer: >90 mL/min (ref >90)
GFR calc non Af Amer: 82 mL/min — ABNORMAL LOW (ref >90)
Glucose, Bld: 194 mg/dL — ABNORMAL HIGH (ref 70–99)
Potassium: 4 mEq/L (ref 3.7–5.3)
Sodium: 136 mEq/L — ABNORMAL LOW (ref 137–147)
Total Bilirubin: 0.4 mg/dL (ref 0.3–1.2)
Total Protein: 6.5 g/dL (ref 6.0–8.3)

## 2014-08-20 LAB — I-STAT CG4 LACTIC ACID, ED: Lactic Acid, Venous: 1.07 mmol/L (ref 0.5–2.2)

## 2014-08-20 LAB — LACTIC ACID, PLASMA: Lactic Acid, Venous: 1.5 mmol/L (ref 0.5–2.2)

## 2014-08-20 LAB — GLUCOSE, CAPILLARY
Glucose-Capillary: 189 mg/dL — ABNORMAL HIGH (ref 70–99)
Glucose-Capillary: 158 mg/dL — ABNORMAL HIGH (ref 70–99)

## 2014-08-20 LAB — HEMOGLOBIN A1C
Hgb A1c MFr Bld: 5.7 % — ABNORMAL HIGH (ref <5.7)
Mean Plasma Glucose: 117 mg/dL — ABNORMAL HIGH (ref <117)

## 2014-08-20 MED ORDER — LATANOPROST 0.005 % OP SOLN
1.0000 [drp] | Freq: Every day | OPHTHALMIC | Status: DC
  Filled 2014-08-20: qty 2.5

## 2014-08-20 MED ORDER — CHLORHEXIDINE GLUCONATE 0.12 % MT SOLN
15.0000 mL | Freq: Two times a day (BID) | OROMUCOSAL | Status: DC
  Administered 2014-08-20: 15 mL via OROMUCOSAL
  Filled 2014-08-20 (×3): qty 15

## 2014-08-20 MED ORDER — HYDRALAZINE HCL 20 MG/ML IJ SOLN: 10.0000 mg | INTRAMUSCULAR | Status: DC | PRN

## 2014-08-20 MED ORDER — CETYLPYRIDINIUM CHLORIDE 0.05 % MT LIQD: 7.0000 mL | Freq: Two times a day (BID) | OROMUCOSAL | Status: DC

## 2014-08-20 MED ORDER — LEVOBUNOLOL HCL 0.5 % OP SOLN
1.0000 [drp] | Freq: Every day | OPHTHALMIC | Status: DC
  Administered 2014-08-20: 1 [drp] via OPHTHALMIC
  Filled 2014-08-20: qty 5

## 2014-08-20 MED ORDER — ONDANSETRON HCL 4 MG PO TABS: 4.0000 mg | ORAL_TABLET | Freq: Four times a day (QID) | ORAL | Status: DC | PRN

## 2014-08-20 MED ORDER — OMEGA-3-ACID ETHYL ESTERS 1 G PO CAPS
1.0000 g | ORAL_CAPSULE | Freq: Three times a day (TID) | ORAL | Status: DC
  Administered 2014-08-20: 1 g via ORAL
  Filled 2014-08-20 (×3): qty 1

## 2014-08-20 MED ORDER — TERAZOSIN HCL 5 MG PO CAPS
5.0000 mg | ORAL_CAPSULE | Freq: Every day | ORAL | Status: DC
  Filled 2014-08-20: qty 1

## 2014-08-20 MED ORDER — METRONIDAZOLE IN NACL 5-0.79 MG/ML-% IV SOLN
500.0000 mg | Freq: Three times a day (TID) | INTRAVENOUS | Status: DC
  Administered 2014-08-20: 500 mg via INTRAVENOUS
  Filled 2014-08-20 (×2): qty 100

## 2014-08-20 MED ORDER — IOHEXOL 300 MG/ML  SOLN
100.0000 mL | Freq: Once | INTRAMUSCULAR | Status: AC | PRN
  Administered 2014-08-20: 100 mL via INTRAVENOUS

## 2014-08-20 MED ORDER — LORATADINE 10 MG PO TABS
10.0000 mg | ORAL_TABLET | Freq: Every day | ORAL | Status: DC
  Administered 2014-08-20: 10 mg via ORAL
  Filled 2014-08-20: qty 1

## 2014-08-20 MED ORDER — HYDROMORPHONE HCL PF 1 MG/ML IJ SOLN
0.5000 mg | INTRAMUSCULAR | Status: DC | PRN
  Administered 2014-08-20 (×3): 0.5 mg via INTRAVENOUS
  Filled 2014-08-20 (×3): qty 1

## 2014-08-20 MED ORDER — IOHEXOL 300 MG/ML  SOLN
50.0000 mL | Freq: Once | INTRAMUSCULAR | Status: AC | PRN
  Administered 2014-08-20: 50 mL via ORAL

## 2014-08-20 MED ORDER — CIPROFLOXACIN HCL 500 MG PO TABS: 500.0000 mg | ORAL_TABLET | Freq: Two times a day (BID) | ORAL | Status: DC

## 2014-08-20 MED ORDER — ACETAMINOPHEN 325 MG PO TABS
650.0000 mg | ORAL_TABLET | Freq: Four times a day (QID) | ORAL | Status: DC | PRN
  Administered 2014-08-20: 650 mg via ORAL
  Filled 2014-08-20: qty 2

## 2014-08-20 MED ORDER — DEXTROSE-NACL 5-0.9 % IV SOLN
INTRAVENOUS | Status: DC
  Administered 2014-08-20: 100 mL via INTRAVENOUS

## 2014-08-20 MED ORDER — CIPROFLOXACIN IN D5W 400 MG/200ML IV SOLN
400.0000 mg | Freq: Once | INTRAVENOUS | Status: AC
  Administered 2014-08-20: 400 mg via INTRAVENOUS
  Filled 2014-08-20: qty 200

## 2014-08-20 MED ORDER — ENOXAPARIN SODIUM 40 MG/0.4ML ~~LOC~~ SOLN
40.0000 mg | SUBCUTANEOUS | Status: DC
  Administered 2014-08-20: 40 mg via SUBCUTANEOUS
  Filled 2014-08-20: qty 0.4

## 2014-08-20 MED ORDER — ACETAMINOPHEN 650 MG RE SUPP: 650.0000 mg | Freq: Four times a day (QID) | RECTAL | Status: DC | PRN

## 2014-08-20 MED ORDER — METRONIDAZOLE IN NACL 5-0.79 MG/ML-% IV SOLN
500.0000 mg | Freq: Once | INTRAVENOUS | Status: AC
  Administered 2014-08-20: 500 mg via INTRAVENOUS
  Filled 2014-08-20: qty 100

## 2014-08-20 MED ORDER — ONDANSETRON HCL 4 MG/2ML IJ SOLN: 4.0000 mg | Freq: Four times a day (QID) | INTRAMUSCULAR | Status: DC | PRN

## 2014-08-20 MED ORDER — DILTIAZEM HCL ER COATED BEADS 300 MG PO CP24
300.0000 mg | ORAL_CAPSULE | Freq: Every day | ORAL | Status: DC
  Administered 2014-08-20: 300 mg via ORAL
  Filled 2014-08-20: qty 1

## 2014-08-20 MED ORDER — HYDROCODONE-ACETAMINOPHEN 5-300 MG PO TABS: 1.0000 | ORAL_TABLET | Freq: Four times a day (QID) | ORAL | Status: DC | PRN

## 2014-08-20 MED ORDER — CIPROFLOXACIN IN D5W 400 MG/200ML IV SOLN
400.0000 mg | Freq: Two times a day (BID) | INTRAVENOUS | Status: DC
  Administered 2014-08-20: 400 mg via INTRAVENOUS
  Filled 2014-08-20 (×2): qty 200

## 2014-08-20 NOTE — Progress Notes (Signed)
Po prn for pain given without relief. Continues to rate pain at a 5. Tolerated diet without nausea but fears unable to get out of pain at home. MD paged

## 2014-08-20 NOTE — H&P (Signed)
[facility] History and Physical  AASHRITH EVES AOZ:[id number] DOB: 09-02-1935 DOA: 08/19/2014  Referring physician: ER physician. PCP: [name], MD  Chief Complaint: Abdominal pain.  HPI: [name] [name] is a 79 y.o. male with history of hypertension, BPH started experiencing abdominal pain last evening. Pain was mostly in the bilateral flank area radiating to the groin. Patient did not have any nausea vomiting or diarrhea. Last bowel movement was yesterday. In the ER CT abdomen and pelvis was done which showed changes likely representing enteritis either inflammatory or infectious. Patient denies any recent travel or sick contacts. Patient's abdominal pain improved with pain in the medication but still has mild pain. Patient has been started on antibiotics and admitted for further management. Patient denies any chest pain or shortness of breath fever chills.   Review of Systems: As presented in the history of presenting illness, rest negative.  Past Medical History  Diagnosis Date  . Allergy   . Hyperlipidemia   . Hypertension   . Hyperglycemia   . ED (erectile dysfunction)   . Diverticulosis of colon (without mention of hemorrhage) 03-01-2004, 04-04-2011    Colonoscopy  . Internal hemorrhoids 03-15-1999    Flex Sig   . Irritable bowel    Past Surgical History  Procedure Laterality Date  . Inguinal hernia repair    . Knee arthroscopy      right knee  . Nasal sinus surgery    . Eye surgery      bilateral cataract extraction per Dr Jettie Pagan  . Colonoscopy  04-04-11    per Dr. Jarold Motto, clear, no repeats needed   Social History:  reports that he quit smoking about 45 years ago. He has never used smokeless tobacco. He reports that he drinks about 1.8 ounces of alcohol per week. He reports that he does not use illicit drugs. Where does patient live home. Can patient participate in ADLs? Yes.  No Known Allergies  Family History:  Family History  Problem Relation Age of  Onset  . Hypertension Mother   . Lung cancer Father   . Colitis Daughter       Prior to Admission medications   Medication Sig Start Date End Date Taking? Authorizing Provider  bimatoprost (LUMIGAN) 0.03 % ophthalmic solution Place 1 drop into both eyes at bedtime.   Yes Historical Provider, MD  cetirizine (ZYRTEC) 10 MG tablet Take 10 mg by mouth daily.    Yes Historical Provider, MD  Cinnamon 500 MG TABS Take 500 mg by mouth daily.    Yes Historical Provider, MD  Coenzyme Q10 (CO Q 10 PO) Take 1 capsule by mouth 2 (two) times daily.    Yes Historical Provider, MD  diltiazem (CARDIZEM CD) 300 MG 24 hr capsule Take 1 capsule (300 mg total) by mouth daily. 11/26/13  Yes [name], MD  fish oil-omega-3 fatty acids 1000 MG capsule Take 1 g by mouth 3 (three) times daily.    Yes Historical Provider, MD  Flaxseed, Linseed, (FLAX SEED OIL PO) Take 1 capsule by mouth 3 (three) times daily.    Yes Historical Provider, MD  Glucosamine-Chondroitin (COSAMIN DS) 500-400 MG CAPS Take 1 tablet by mouth 2 (two) times daily.   Yes Historical Provider, MD  levobunolol (BETAGAN) 0.5 % ophthalmic solution Place 1 drop into both eyes daily.   Yes Historical Provider, MD  Multiple Vitamins-Minerals (MULTIVITAMIN WITH MINERALS) tablet Take 1 tablet by mouth 2 (two) times daily.    Yes Historical Provider, MD  sildenafil (VIAGRA) 100 MG tablet TAKE 1 TABLET (100 MG TOTAL) AS NEEDED FOR ERECTILE DYSFUNCTION 10/30/13  Yes [name], MD  terazosin (HYTRIN) 2 MG capsule TAKE 2 CAPSULES (4 MG) AT BEDTIME 08/08/14  Yes [name], MD    Physical Exam: Filed Vitals:   08/19/14 2145 08/20/14 0208 08/20/14 0307  BP: 158/97 171/86 133/84  Pulse: 83 81 95  Temp: 97.4 F (36.3 C)  98.6 F (37 C)  TempSrc: Oral  Oral  Resp: Height:    (1.854 m)  Weight:   90.8 kg (200 lb 2.8 oz)  SpO2: 96% 95% 93%     General:  Well-developed and nourished.  Eyes: Anicteric no pallor.  ENT: No discharge  from the ears eyes nose mouth.  Neck: No mass felt.  Cardiovascular: S1-S2 heard.  Respiratory: No rhonchi or crepitations.  Abdomen: Distended bowel sounds present. No guarding or rigidity.  Skin: No rash.  Musculoskeletal: No edema.  Psychiatric: Appears normal.  Neurologic: Alert and oriented to time place and person. Moves all extremities.  Labs on Admission:  Basic Metabolic Panel:  Recent Labs Lab 08/19/14 2208  NA 140  K 3.8  CL 97  CO2 27  GLUCOSE 197*  BUN 10  CREATININE 0.88  CALCIUM 10.2   Liver Function Tests:  Recent Labs Lab 08/19/14 2208  AST 27  ALT 14  ALKPHOS 51  BILITOT 0.4  PROT 7.9  ALBUMIN 4.8    Recent Labs Lab 08/19/14 2208  LIPASE 17   No results found for this basename: AMMONIA,  in the last 168 hours CBC:  Recent Labs Lab 08/19/14 2208  WBC 7.0  NEUTROABS 5.5  HGB 15.0  HCT 45.6  MCV 80.9  PLT 222   Cardiac Enzymes: No results found for this basename: CKTOTAL, CKMB, CKMBINDEX, TROPONINI,  in the last 168 hours  BNP (last 3 results) No results found for this basename: PROBNP,  in the last 8760 hours CBG: No results found for this basename: GLUCAP,  in the last 168 hours  Radiological Exams on Admission: Ct Abdomen Pelvis W Contrast  08/20/2014   CLINICAL DATA:  Lower abdominal pain radiating to the back.  EXAM: CT ABDOMEN AND PELVIS WITH CONTRAST  TECHNIQUE: Multidetector CT imaging of the abdomen and pelvis was performed using the standard protocol following bolus administration of intravenous contrast.  CONTRAST:  OMNIPAQUE IOHEXOL 300 MG/ML  SOLN  COMPARISON:  05/25/2012  FINDINGS: Nodular scarring in the right lung base appears unchanged. Slight fibrosis in the lung bases again demonstrated. Minimal pericardial effusion or thickening.  Small amount of free fluid inferior to the liver edge and extending along the right pericolic gutter down into the pelvis. Small amount of free fluid in the left upper  quadrant mesenteric, extending down into the pelvis. Mildly dilated mid abdominal small bowel with mesenteric fluid present. Mild small bowel wall thickening. No free air. No portal venous gas or pneumatosis. Changes likely to indicate enteritis, possibly due to infectious or ischemic etiology. Perforation is fell less likely. Terminal ileum is decompressed and degree of obstruction could also be present. The visualized mesenteric arteries and veins appear patent. No obvious vascular insult. Colon is stool filled without distention or wall thickening.  Liver, spleen, gallbladder, pancreas, adrenal glands, abdominal aorta, inferior vena cava, and retroperitoneal lymph nodes are unremarkable. Heterogeneous calcified lesion in the upper pole of the right kidney is again demonstrated without change. No hydronephrosis in the  kidneys.  Pelvis: Small amount of free fluid in the pelvis extending from inflammatory process in the abdomen/mesenteric. Prostate gland is diffusely enlarged, measuring 6.3 x 4.9 cm. Bladder is mostly decompressed. No pelvic mass or lymphadenopathy. Appendix is normal. No evidence of diverticulitis. Degenerative changes in the spine. No destructive bone lesions identified.  IMPRESSION: Mid abdominal small bowel dilatation with fluid-filled loops. Mild small bowel wall thickening. Mesenteric edema with small amount of free fluid in the abdomen and pelvis. Changes likely to represent enteritis, either infectious or inflammatory or ischemic etiology. No definitive vascular disease. No pneumatosis or portal venous gas.  These results were called by telephone at the time of interpretation on 08/20/2014 at 1:58 am to Dr. Marisa Severin , who verbally acknowledged these results.   Electronically Signed   By: Burman Nieves M.D.   On: 08/20/2014 02:01     Assessment/Plan Principal Problem:   Abdominal pain Active Problems:   HYPERLIPIDEMIA   HYPERTENSION   Enteritis   1. Abdominal pain - most  likely secondary to enteritis. Patient has been kept n.p.o. except medications. Continue with Flagyl and Cipro. If pain improves may try clear liquids and advance. Patient's CAT scan did show some small bowel dilation. Closely observe for any symptoms representing obstruction. Presently patient has no nausea vomiting. 2. Hypertension - continue home medications. 3. History of BPH - continue home medications. 4. Hyperglycemia - check hemoglobin A1c. 5. History of GI bleed 2 years ago suspected secondary to diverticulosis. 6. Right renal lesion - will need close followup with primary care.    Code Status: Full code.  Family Communication: None.  Disposition Plan: Admit to inpatient.    Cardell Rachel N. [facility] Pager [phone].  If 7PM-7AM, please contact night-coverage [email or web address] Password [email or web address] 08/20/2014, 3:50 AM

## 2014-08-20 NOTE — Care Management Note (Signed)
    Page 1 of 1   08/20/2014     11:07:47 AM CARE MANAGEMENT NOTE 08/20/2014  Patient:  [name] [name], [name] [name]   Account Number:  [id number]  Date Initiated:  08/20/2014  Documentation initiated by:  Lorenda Ishihara  Subjective/Objective Assessment:   79 yo male admitted with abd pain, enteritis. PTA lived at home with spouse.     Action/Plan:   Home when stable   Anticipated DC Date:  08/23/2014   Anticipated DC Plan:  HOME/SELF CARE      DC Planning Services  CM consult      Choice offered to / List presented to:             Status of service:  Completed, signed off Medicare Important Message given?   (If response is "NO", the following Medicare IM given date fields will be blank) Date Medicare IM given:   Medicare IM given by:   Date Additional Medicare IM given:   Additional Medicare IM given by:    Discharge Disposition:  HOME/SELF CARE  Per UR Regulation:  Reviewed for med. necessity/level of care/duration of stay  If discussed at Long Length of Stay Meetings, dates discussed:    Comments:

## 2014-08-20 NOTE — Discharge Instructions (Signed)

## 2014-08-20 NOTE — Discharge Summary (Signed)
Physician Discharge Summary  [name] [name] ZOX:[id number] DOB: 03/31/1935 DOA: 08/19/2014  PCP: [name], MD  Admit date: 08/19/2014 Discharge date: 08/20/2014  Recommendations for Outpatient Follow-up:  1. Pt will need to follow up with PCP in 2-3 weeks post discharge 2. Please obtain BMP to evaluate electrolytes and kidney function 3. Please also check CBC to evaluate Hg and Hct levels 4. Please note that pt was discharged on Cipro  5. He insisted on going home with advice to follow up closely with PCP within next week   Discharge Diagnoses: Viral gastroenteritis  Principal Problem:   Abdominal pain Active Problems:   HYPERLIPIDEMIA   HYPERTENSION   Enteritis  Discharge Condition: Stable  Diet recommendation: Heart healthy diet discussed in details   History of present illness:  79 y.o. male with history of hypertension, BPH started experiencing abdominal pain 2 days prior to this admission. Pain was mostly in the bilateral flank area radiating to the groin. Patient did not have any nausea, vomiting, or diarrhea. Last bowel movement was one day PTA. In the ER CT abdomen and pelvis was done which showed changes likely representing enteritis either inflammatory or infectious. Patient denies any recent travel or sick contacts. Patient's abdominal pain improved with pain in the medication but still has mild pain. Patient has been started on antibiotics and admitted for further management. Patient denies any chest pain or shortness of breath, fever, chills.   Hospital Course:  Principal Problem:   Abdominal pain - abd pain improved and pt tolerating regular diet well - he was transitioned to oral ABX cipro recommended close follow up - he has tolerated regular diet and wants to go home  Active Problems:   HYPERLIPIDEMIA - stable    HYPERTENSION - reasonable inpatient control   Procedures/Studies: Ct Abdomen Pelvis W Contrast  08/20/2014   CLINICAL DATA:  Lower abdominal  pain radiating to the back.  EXAM: CT ABDOMEN AND PELVIS WITH CONTRAST  TECHNIQUE: Multidetector CT imaging of the abdomen and pelvis was performed using the standard protocol following bolus administration of intravenous contrast.  CONTRAST:  OMNIPAQUE IOHEXOL 300 MG/ML  SOLN  COMPARISON:  05/25/2012  FINDINGS: Nodular scarring in the right lung base appears unchanged. Slight fibrosis in the lung bases again demonstrated. Minimal pericardial effusion or thickening.  Small amount of free fluid inferior to the liver edge and extending along the right pericolic gutter down into the pelvis. Small amount of free fluid in the left upper quadrant mesenteric, extending down into the pelvis. Mildly dilated mid abdominal small bowel with mesenteric fluid present. Mild small bowel wall thickening. No free air. No portal venous gas or pneumatosis. Changes likely to indicate enteritis, possibly due to infectious or ischemic etiology. Perforation is fell less likely. Terminal ileum is decompressed and degree of obstruction could also be present. The visualized mesenteric arteries and veins appear patent. No obvious vascular insult. Colon is stool filled without distention or wall thickening.  Liver, spleen, gallbladder, pancreas, adrenal glands, abdominal aorta, inferior vena cava, and retroperitoneal lymph nodes are unremarkable. Heterogeneous calcified lesion in the upper pole of the right kidney is again demonstrated without change. No hydronephrosis in the kidneys.  Pelvis: Small amount of free fluid in the pelvis extending from inflammatory process in the abdomen/mesenteric. Prostate gland is diffusely enlarged, measuring 6.3 x 4.9 cm. Bladder is mostly decompressed. No pelvic mass or lymphadenopathy. Appendix is normal. No evidence of diverticulitis. Degenerative changes in the spine. No destructive bone lesions identified.  IMPRESSION: Mid abdominal small bowel dilatation with fluid-filled loops. Mild small bowel  wall thickening. Mesenteric edema with small amount of free fluid in the abdomen and pelvis. Changes likely to represent enteritis, either infectious or inflammatory or ischemic etiology. No definitive vascular disease. No pneumatosis or portal venous gas.  These results were called by telephone at the time of interpretation on 08/20/2014 at 1:58 am to Dr. Marisa Severin , who verbally acknowledged these results.   Electronically Signed   By: Burman Nieves M.D.   On: 08/20/2014 02:01   Consultations:  None  Antibiotics:  Ciprofloxacin upon discharge   Discharge Exam: Filed Vitals:   08/20/14 0927  BP: 116/75  Pulse:   Temp:   Resp:    Filed Vitals:   08/20/14 0208 08/20/14 0307 08/20/14 0606 08/20/14 0927  BP: 171/86 133/84 153/79 116/75  Pulse: 81 95 74   Temp:  98.6 F (37 C) 99.2 F (37.3 C)   TempSrc:  Oral Oral   Resp: Height:   (1.854 m)    Weight:  90.8 kg (200 lb 2.8 oz)    SpO2: 95% 93% 94%     General: Pt is alert, follows commands appropriately, not in acute distress Cardiovascular: Regular rate and rhythm, S1/S2 +, no murmurs, no rubs, no gallops Respiratory: Clear to auscultation bilaterally, no wheezing, no crackles, no rhonchi Abdominal: Soft, non tender, non distended, bowel sounds +, no guarding Extremities: no edema, no cyanosis, pulses palpable bilaterally DP and PT Neuro: Grossly nonfocal  Discharge Instructions  Discharge Instructions   Diet - low sodium heart healthy    Complete by:  As directed      Increase activity slowly    Complete by:  As directed             Medication List         bimatoprost 0.03 % ophthalmic solution  Commonly known as:  LUMIGAN  Place 1 drop into both eyes at bedtime.     Cinnamon 500 MG Tabs  Take 500 mg by mouth daily.     ciprofloxacin 500 MG tablet  Commonly known as:  CIPRO  Take 1 tablet (500 mg total) by mouth 2 (two) times daily.     CO Q 10 PO  Take 1 capsule by mouth 2 (two)  times daily.     COSAMIN DS 500-400 MG Caps  Generic drug:  Glucosamine-Chondroitin  Take 1 tablet by mouth 2 (two) times daily.     diltiazem 300 MG 24 hr capsule  Commonly known as:  CARDIZEM CD  Take 1 capsule (300 mg total) by mouth daily.     fish oil-omega-3 fatty acids 1000 MG capsule  Take 1 g by mouth 3 (three) times daily.     FLAX SEED OIL PO  Take 1 capsule by mouth 3 (three) times daily.     levobunolol 0.5 % ophthalmic solution  Commonly known as:  BETAGAN  Place 1 drop into both eyes daily.     multivitamin with minerals tablet  Take 1 tablet by mouth 2 (two) times daily.     sildenafil 100 MG tablet  Commonly known as:  VIAGRA  TAKE 1 TABLET (100 MG TOTAL) AS NEEDED FOR ERECTILE DYSFUNCTION     terazosin 2 MG capsule  Commonly known as:  HYTRIN  TAKE 2 CAPSULES (4 MG) AT BEDTIME     ZYRTEC 10 MG tablet  Generic drug:  cetirizine  Take  10 mg by mouth daily.           Follow-up Information   Schedule an appointment as soon as possible for a visit with [name], MD.   Specialty:  Family Medicine   Contact information:   [address] [place] [address] [phone]       Follow up with Debbora Presto, MD.   Specialty:  Internal Medicine   Contact information:   [address] [place] [address] [phone]        The results of significant diagnostics from this hospitalization (including imaging, microbiology, ancillary and laboratory) are listed below for reference.     Microbiology: No results found for this or any previous visit (from the past 240 hour(s)).   Labs: Basic Metabolic Panel:  Recent Labs Lab 08/19/14 2208 08/20/14 0510  NA 140 136*  K 3.8 4.0  CL 97 98  CO2 27 27  GLUCOSE 197* 194*  BUN 10 10  CREATININE 0.88 0.87  CALCIUM 10.2 9.1   Liver Function Tests:  Recent Labs Lab 08/19/14 2208 08/20/14 0510  AST 27 21  ALT 14 12  ALKPHOS 51 42  BILITOT 0.4 0.4   PROT 7.9 6.5  ALBUMIN 4.8 3.7    Recent Labs Lab 08/19/14 2208 08/20/14 0510  LIPASE 17 13   No results found for this basename: AMMONIA,  in the last 168 hours CBC:  Recent Labs Lab 08/19/14 2208 08/20/14 0510  WBC 7.0 7.6  NEUTROABS 5.5 6.5  HGB 15.0 14.0  HCT 45.6 43.2  MCV 80.9 82.0  PLT 222 175   Cardiac Enzymes: No results found for this basename: CKTOTAL, CKMB, CKMBINDEX, TROPONINI,  in the last 168 hours BNP: BNP (last 3 results) No results found for this basename: PROBNP,  in the last 8760 hours CBG:  Recent Labs Lab 08/20/14 0738  GLUCAP 189*     SIGNED: Time coordinating discharge: Over 30 minutes  Debbora Presto, MD  [facility] 08/20/2014, 11:05 AM Pager [phone]  If 7PM-7AM, please contact night-coverage [email or web address] Password [email or web address]

## 2014-08-20 NOTE — Progress Notes (Signed)
Discharge instructions discussed with patient. Voices understanding and able to answer questions concerning follow up appointment and to take antibiotic until gone.m discharged with wife via wheel chair. Assessment unchanged

## 2014-08-20 NOTE — Progress Notes (Signed)
ANTIBIOTIC CONSULT NOTE - INITIAL  Pharmacy Consult for cipro Indication: Intra-abdominal infection  No Known Allergies  Patient Measurements: Height:  (185.4 cm) Weight: 200 lb 2.8 oz (90.8 kg) IBW/kg (Calculated) : 79.9 Adjusted Body Weight:   Vital Signs: Temp: 98.6 F (37 C) (08/26 0307) Temp src: Oral (08/26 0307) BP: 133/84 mmHg (08/26 0307) Pulse Rate: 95 (08/26 0307) Intake/Output from previous day:   Intake/Output from this shift:    Labs:  Recent Labs  08/19/14 2208  WBC 7.0  HGB 15.0  PLT 222  CREATININE 0.88   Estimated Creatinine Clearance: 80.7 ml/min (by C-G formula based on Cr of 0.88). No results found for this basename: VANCOTROUGH, VANCOPEAK, VANCORANDOM, GENTTROUGH, GENTPEAK, GENTRANDOM, TOBRATROUGH, TOBRAPEAK, TOBRARND, AMIKACINPEAK, AMIKACINTROU, AMIKACIN,  in the last 72 hours   Microbiology: No results found for this or any previous visit (from the past 720 hour(s)).  Medical History: Past Medical History  Diagnosis Date  . Allergy   . Hyperlipidemia   . Hypertension   . Hyperglycemia   . ED (erectile dysfunction)   . Diverticulosis of colon (without mention of hemorrhage) 03-01-2004, 04-04-2011    Colonoscopy  . Internal hemorrhoids 03-15-1999    Flex Sig   . Irritable bowel     Medications:  Anti-infectives   Start     Dose/Rate Route Frequency Ordered Stop   08/20/14 1400  metroNIDAZOLE (FLAGYL) IVPB 500 mg     500 mg 100 mL/hr over 60 Minutes Intravenous Every 8 hours 08/20/14 0349     08/20/14 1000  ciprofloxacin (CIPRO) IVPB 400 mg     400 mg 200 mL/hr over 60 Minutes Intravenous Every 12 hours 08/20/14 0353     08/20/14 0215  ciprofloxacin (CIPRO) IVPB 400 mg     400 mg 200 mL/hr over 60 Minutes Intravenous  Once 08/20/14 0214 08/20/14 0322   08/20/14 0215  metroNIDAZOLE (FLAGYL) IVPB 500 mg     500 mg 100 mL/hr over 60 Minutes Intravenous  Once 08/20/14 0214       Assessment: Patient with Intra-abdominal  infection.  First dose of antibiotics already given in ED.  Goal of Therapy:  Cipro dosed based on patient weight and renal function   Plan:  Follow up culture results Cipro  iv q12hr  Aleene Davidson Crowford 08/20/2014,3:53 AM

## 2014-08-21 ENCOUNTER — Inpatient Hospital Stay (HOSPITAL_COMMUNITY)
Admission: AD | Admit: 2014-08-21 | Discharge: 2014-08-28 | DRG: 389 | Disposition: A | Discharge status: Home / Self Care | Payer: Medicare Other | Source: Ambulatory Visit | Attending: Internal Medicine | Admitting: Internal Medicine

## 2014-08-21 ENCOUNTER — Inpatient Hospital Stay (HOSPITAL_COMMUNITY): Payer: Medicare Other

## 2014-08-21 ENCOUNTER — Encounter (HOSPITAL_COMMUNITY): Payer: Self-pay | Admitting: *Deleted

## 2014-08-21 DIAGNOSIS — Z87891 Personal history of nicotine dependence: Secondary | ICD-10-CM | POA: Diagnosis not present

## 2014-08-21 DIAGNOSIS — R109 Unspecified abdominal pain: Secondary | ICD-10-CM

## 2014-08-21 DIAGNOSIS — D62 Acute posthemorrhagic anemia: Secondary | ICD-10-CM | POA: Diagnosis present

## 2014-08-21 DIAGNOSIS — B009 Herpesviral infection, unspecified: Secondary | ICD-10-CM

## 2014-08-21 DIAGNOSIS — K5731 Diverticulosis of large intestine without perforation or abscess with bleeding: Secondary | ICD-10-CM

## 2014-08-21 DIAGNOSIS — E876 Hypokalemia: Secondary | ICD-10-CM | POA: Diagnosis present

## 2014-08-21 DIAGNOSIS — E871 Hypo-osmolality and hyponatremia: Secondary | ICD-10-CM

## 2014-08-21 DIAGNOSIS — E785 Hyperlipidemia, unspecified: Secondary | ICD-10-CM

## 2014-08-21 DIAGNOSIS — I10 Essential (primary) hypertension: Secondary | ICD-10-CM

## 2014-08-21 DIAGNOSIS — N2889 Other specified disorders of kidney and ureter: Secondary | ICD-10-CM

## 2014-08-21 DIAGNOSIS — Z9849 Cataract extraction status, unspecified eye: Secondary | ICD-10-CM | POA: Diagnosis not present

## 2014-08-21 DIAGNOSIS — K56609 Unspecified intestinal obstruction, unspecified as to partial versus complete obstruction: Principal | ICD-10-CM

## 2014-08-21 DIAGNOSIS — I479 Paroxysmal tachycardia, unspecified: Secondary | ICD-10-CM

## 2014-08-21 DIAGNOSIS — K529 Noninfective gastroenteritis and colitis, unspecified: Secondary | ICD-10-CM

## 2014-08-21 DIAGNOSIS — Z79899 Other long term (current) drug therapy: Secondary | ICD-10-CM | POA: Diagnosis not present

## 2014-08-21 DIAGNOSIS — J309 Allergic rhinitis, unspecified: Secondary | ICD-10-CM

## 2014-08-21 DIAGNOSIS — D721 Eosinophilia, unspecified: Secondary | ICD-10-CM

## 2014-08-21 LAB — CBC
HCT: 43.2 % (ref 39.0–52.0)
Hemoglobin: 14.6 g/dL (ref 13.0–17.0)
MCH: 27.1 pg (ref 26.0–34.0)
MCHC: 33.8 g/dL (ref 30.0–36.0)
MCV: 80.1 fL (ref 78.0–100.0)
Platelets: 195 10*3/uL (ref 150–400)
RBC: 5.39 MIL/uL (ref 4.22–5.81)
RDW: 13.3 % (ref 11.5–15.5)
WBC: 10.3 10*3/uL (ref 4.0–10.5)

## 2014-08-21 LAB — COMPREHENSIVE METABOLIC PANEL
ALT: 17 U/L (ref 0–53)
AST: 35 U/L (ref 0–37)
Albumin: 4.3 g/dL (ref 3.5–5.2)
Alkaline Phosphatase: 45 U/L (ref 39–117)
Anion gap: 16 — ABNORMAL HIGH (ref 5–15)
BUN: 20 mg/dL (ref 6–23)
CO2: 27 mEq/L (ref 19–32)
Calcium: 10.1 mg/dL (ref 8.4–10.5)
Chloride: 89 mEq/L — ABNORMAL LOW (ref 96–112)
Creatinine, Ser: 0.97 mg/dL (ref 0.50–1.35)
GFR calc Af Amer: >90 mL/min (ref >90)
GFR calc non Af Amer: 78 mL/min — ABNORMAL LOW (ref >90)
Glucose, Bld: 197 mg/dL — ABNORMAL HIGH (ref 70–99)
Potassium: 3.7 mEq/L (ref 3.7–5.3)
Sodium: 132 mEq/L — ABNORMAL LOW (ref 137–147)
Total Bilirubin: 0.7 mg/dL (ref 0.3–1.2)
Total Protein: 7.5 g/dL (ref 6.0–8.3)

## 2014-08-21 LAB — LIPASE, BLOOD: Lipase: 26 U/L (ref 11–59)

## 2014-08-21 LAB — TSH: TSH: 0.802 u[IU]/mL (ref 0.350–4.500)

## 2014-08-21 LAB — MAGNESIUM: Magnesium: 1.9 mg/dL (ref 1.5–2.5)

## 2014-08-21 LAB — PHOSPHORUS: Phosphorus: 3.1 mg/dL (ref 2.3–4.6)

## 2014-08-21 MED ORDER — PANTOPRAZOLE SODIUM 40 MG IV SOLR
40.0000 mg | INTRAVENOUS | Status: DC
  Administered 2014-08-21 – 2014-08-25 (×5): 40 mg via INTRAVENOUS
  Filled 2014-08-21 (×6): qty 40

## 2014-08-21 MED ORDER — ONDANSETRON HCL 4 MG/2ML IJ SOLN: 4.0000 mg | Freq: Four times a day (QID) | INTRAMUSCULAR | Status: DC | PRN

## 2014-08-21 MED ORDER — TERAZOSIN HCL 2 MG PO CAPS
4.0000 mg | ORAL_CAPSULE | Freq: Every day | ORAL | Status: DC
Start: 2014-08-21 — End: 2014-08-28
  Administered 2014-08-21 – 2014-08-27 (×5): 4 mg via ORAL
  Filled 2014-08-21 (×8): qty 2

## 2014-08-21 MED ORDER — HYDROCODONE-ACETAMINOPHEN 5-325 MG PO TABS
1.0000 | ORAL_TABLET | ORAL | Status: DC | PRN
Start: 2014-08-21 — End: 2014-08-22

## 2014-08-21 MED ORDER — DILTIAZEM HCL ER COATED BEADS 300 MG PO CP24
300.0000 mg | ORAL_CAPSULE | Freq: Every day | ORAL | Status: DC
  Administered 2014-08-22 – 2014-08-28 (×7): 300 mg via ORAL
  Filled 2014-08-21 (×7): qty 1

## 2014-08-21 MED ORDER — ONDANSETRON HCL 4 MG PO TABS: 4.0000 mg | ORAL_TABLET | Freq: Four times a day (QID) | ORAL | Status: DC | PRN

## 2014-08-21 MED ORDER — TERAZOSIN HCL 5 MG PO CAPS: 5.0000 mg | ORAL_CAPSULE | Freq: Every day | ORAL | Status: DC

## 2014-08-21 MED ORDER — GI COCKTAIL ~~LOC~~
30.0000 mL | Freq: Three times a day (TID) | ORAL | Status: DC | PRN
  Administered 2014-08-21: 30 mL via ORAL
  Filled 2014-08-21: qty 30

## 2014-08-21 MED ORDER — LEVOBUNOLOL HCL 0.5 % OP SOLN
1.0000 [drp] | Freq: Every day | OPHTHALMIC | Status: DC
  Administered 2014-08-23 – 2014-08-28 (×6): 1 [drp] via OPHTHALMIC
  Filled 2014-08-21: qty 5

## 2014-08-21 MED ORDER — HYDROMORPHONE HCL PF 1 MG/ML IJ SOLN
1.0000 mg | INTRAMUSCULAR | Status: DC | PRN
  Administered 2014-08-22: 1 mg via INTRAVENOUS
  Filled 2014-08-21: qty 1

## 2014-08-21 MED ORDER — LATANOPROST 0.005 % OP SOLN
1.0000 [drp] | Freq: Every day | OPHTHALMIC | Status: DC
  Administered 2014-08-21 – 2014-08-27 (×7): 1 [drp] via OPHTHALMIC
  Filled 2014-08-21: qty 2.5

## 2014-08-21 MED ORDER — ENOXAPARIN SODIUM 40 MG/0.4ML ~~LOC~~ SOLN
40.0000 mg | SUBCUTANEOUS | Status: DC
  Administered 2014-08-21 – 2014-08-27 (×7): 40 mg via SUBCUTANEOUS
  Filled 2014-08-21 (×8): qty 0.4

## 2014-08-21 MED ORDER — CIPROFLOXACIN IN D5W 400 MG/200ML IV SOLN
400.0000 mg | Freq: Two times a day (BID) | INTRAVENOUS | Status: DC
  Administered 2014-08-21 – 2014-08-25 (×9): 400 mg via INTRAVENOUS
  Filled 2014-08-21 (×12): qty 200

## 2014-08-21 MED ORDER — SODIUM CHLORIDE 0.9 % IV SOLN
INTRAVENOUS | Status: DC
  Administered 2014-08-21 – 2014-08-24 (×4): via INTRAVENOUS
  Administered 2014-08-25: 1000 mL via INTRAVENOUS

## 2014-08-21 MED ORDER — METRONIDAZOLE IN NACL 5-0.79 MG/ML-% IV SOLN
500.0000 mg | Freq: Three times a day (TID) | INTRAVENOUS | Status: DC
  Administered 2014-08-21 – 2014-08-26 (×13): 500 mg via INTRAVENOUS
  Filled 2014-08-21 (×15): qty 100

## 2014-08-21 MED ORDER — ACETAMINOPHEN 325 MG PO TABS: 650.0000 mg | ORAL_TABLET | Freq: Four times a day (QID) | ORAL | Status: DC | PRN

## 2014-08-21 MED ORDER — PROMETHAZINE HCL 25 MG/ML IJ SOLN: 12.5000 mg | Freq: Four times a day (QID) | INTRAMUSCULAR | Status: DC | PRN

## 2014-08-21 NOTE — H&P (Addendum)
[facility] History and Physical  [name] [name] OZH:[id number] DOB: 29-Nov-1935 DOA: 08/21/2014  Referring physician: ED physician PCP: [name] Salisbury, MD   Chief Complaint: abd pain   HPI:  Pt is 79 yo male who was discharged yesterday after being admitted for abd pain and was diagnosed with presumptive enteritis. Pt reports he felt better yesterday but as he continued to advance his diet he started to get more sick with more nausea and non bloody vomiting. Pt denies similar events in the past, no recent sick contacts or exposures. Pt reports abd pain is crampy like, intermittent and 5/10 in severity, worse with eating solids and better with drinking liquids, no other specific alleviating or aggravating factors. Pt admitted from home after talking to myself.  Assessment and Plan: Active Problems: Abdominal pain with nausea and vomiting - secondary to SBO, unclear etiology  - pt refuses NGT for now but may be needed if he does not improve clinically  - will proceed with abx xray, chest xray - will keep NPO, provide supportive care with IVF, analgesia, antiemetics as needed - starte Cipro and Flagyl given recent findings on Ct abd - consider surgery consult in AM   Radiological Exams on Admission: Ct Abdomen Pelvis W Contrast  08/20/2014  Mid abdominal small bowel dilatation with fluid-filled loops. Mild small bowel wall thickening. Mesenteric edema with small amount of free fluid in the abdomen and pelvis. Changes likely to represent enteritis, either infectious or inflammatory or ischemic etiology. No definitive vascular disease. No pneumatosis or portal venous gas.    Code Status: Full Family Communication: Pt at bedside Disposition Plan: Admit for further evaluation     Review of Systems:  Constitutional: Negative for fever, chills and malaise/fatigue. Negative for diaphoresis.  HENT: Negative for hearing loss, ear pain, nosebleeds, congestion, sore throat, neck pain,  tinnitus and ear discharge.   Eyes: Negative for blurred vision, double vision, photophobia, pain, discharge and redness.  Respiratory: Negative for cough, hemoptysis, sputum production, shortness of breath, wheezing and stridor.   Cardiovascular: Negative for chest pain, palpitations, orthopnea, claudication and leg swelling.  Gastrointestinal: Per HPI  Genitourinary: Negative for dysuria, urgency, frequency, hematuria and flank pain.  Musculoskeletal: Negative for myalgias, back pain, joint pain and falls.  Skin: Negative for itching and rash.  Neurological: Negative for dizziness and weakness. Endo/Heme/Allergies: Negative for environmental allergies and polydipsia. Does not bruise/bleed easily.  Psychiatric/Behavioral: Negative for suicidal ideas. The patient is not nervous/anxious.      Past Medical History  Diagnosis Date  . Allergy   . Hyperlipidemia   . Hypertension   . Hyperglycemia   . ED (erectile dysfunction)   . Diverticulosis of colon (without mention of hemorrhage) 03-01-2004, 04-04-2011    Colonoscopy  . Internal hemorrhoids 03-15-1999    Flex Sig   . Irritable bowel     Past Surgical History  Procedure Laterality Date  . Inguinal hernia repair    . Knee arthroscopy      right knee  . Nasal sinus surgery    . Eye surgery      bilateral cataract extraction per Dr Jettie Pagan  . Colonoscopy  04-04-11    per Dr. Jarold Motto, clear, no repeats needed    Social History:  reports that he quit smoking about 45 years ago. He has never used smokeless tobacco. He reports that he drinks about 1.8 ounces of alcohol per week. He reports that he does not use illicit drugs.  No Known Allergies  Family  History  Problem Relation Age of Onset  . Hypertension Mother   . Lung cancer Father   . Colitis Daughter     Prior to Admission medications   Medication Sig Start Date End Date Taking? Authorizing Provider  bimatoprost (LUMIGAN) 0.03 % ophthalmic solution Place 1 drop into both  eyes at bedtime.    Historical Provider, MD  cetirizine (ZYRTEC) 10 MG tablet Take 10 mg by mouth daily.     Historical Provider, MD  Cinnamon 500 MG TABS Take 500 mg by mouth daily.     Historical Provider, MD  ciprofloxacin (CIPRO) 500 MG tablet Take 1 tablet (500 mg total) by mouth 2 (two) times daily. 08/20/14   Dorothea Ogle, MD  Coenzyme Q10 (CO Q 10 PO) Take 1 capsule by mouth 2 (two) times daily.     Historical Provider, MD  diltiazem (CARDIZEM CD) 300 MG 24 hr capsule Take 1 capsule (300 mg total) by mouth daily. 11/26/13   [name] Salisbury, MD  fish oil-omega-3 fatty acids 1000 MG capsule Take 1 g by mouth 3 (three) times daily.     Historical Provider, MD  Flaxseed, Linseed, (FLAX SEED OIL PO) Take 1 capsule by mouth 3 (three) times daily.     Historical Provider, MD  Glucosamine-Chondroitin (COSAMIN DS) 500-400 MG CAPS Take 1 tablet by mouth 2 (two) times daily.    Historical Provider, MD  Hydrocodone-Acetaminophen (VICODIN) 5-300 MG TABS Take 1 tablet by mouth every 6 (six) hours as needed. 08/20/14   Dorothea Ogle, MD  levobunolol (BETAGAN) 0.5 % ophthalmic solution Place 1 drop into both eyes daily.    Historical Provider, MD  Multiple Vitamins-Minerals (MULTIVITAMIN WITH MINERALS) tablet Take 1 tablet by mouth 2 (two) times daily.     Historical Provider, MD  sildenafil (VIAGRA) 100 MG tablet TAKE 1 TABLET (100 MG TOTAL) AS NEEDED FOR ERECTILE DYSFUNCTION 10/30/13   [name] Salisbury, MD  terazosin (HYTRIN) 2 MG capsule TAKE 2 CAPSULES (4 MG) AT BEDTIME 08/08/14   [name] Salisbury, MD    Physical Exam: Filed Vitals:   08/21/14 1949  BP: 145/93  Pulse: 85  Temp: 97.5 F (36.4 C)  TempSrc: Oral  Resp: 16  Height:  (1.854 m)  SpO2: 96%    Physical Exam  Constitutional: Appears well-developed and well-nourished. No distress.  HENT: Normocephalic. External right and left ear normal. Oropharynx is clear and moist.  Eyes: Conjunctivae and EOM are normal. PERRLA, no scleral icterus.   Neck: Normal ROM. Neck supple. No JVD. No tracheal deviation. No thyromegaly.  CVS: RRR, S1/S2 +, no gallops, no carotid bruit.  Pulmonary: Effort and breath sounds normal, no stridor, rhonchi, wheezes, rales.  Abdominal: slightly Hard. BS +,  Significant distension, tenderness in epigastric area, no rebound or guarding.  Musculoskeletal: Normal range of motion. No edema and no tenderness.  Lymphadenopathy: No lymphadenopathy noted, cervical, inguinal. Neuro: Alert. Normal reflexes, muscle tone coordination. No cranial nerve deficit. Skin: Skin is warm and dry. No rash noted. Not diaphoretic. No erythema. No pallor.  Psychiatric: Normal mood and affect. Behavior, judgment, thought content normal.   Labs on Admission:  Basic Metabolic Panel:  Recent Labs Lab 08/19/14 2208 08/20/14 0510  NA 140 136*  K 3.8 4.0  CL 97 98  CO2 27 27  GLUCOSE 197* 194*  BUN 10 10  CREATININE 0.88 0.87  CALCIUM 10.2 9.1   Liver Function Tests:  Recent Labs Lab 08/19/14 2208 08/20/14 0510  AST 27 21  ALT 14 12  ALKPHOS 51 42  BILITOT 0.4 0.4  PROT 7.9 6.5  ALBUMIN 4.8 3.7    Recent Labs Lab 08/19/14 2208 08/20/14 0510  LIPASE 17 13   CBC:  Recent Labs Lab 08/19/14 2208 08/20/14 0510  WBC 7.0 7.6  NEUTROABS 5.5 6.5  HGB 15.0 14.0  HCT 45.6 43.2  MCV 80.9 82.0  PLT 222 175   CBG:  Recent Labs Lab 08/20/14 0738 08/20/14 1145  GLUCAP 189* 158*    EKG: Normal sinus rhythm, no ST/T wave changes  Debbora Presto, MD  [facility] Pager [phone]  If 7PM-7AM, please contact night-coverage [email or web address] Password [facility] 08/21/2014, 7:52 PM

## 2014-08-21 NOTE — Progress Notes (Signed)
Admitting md called and informed that pt on floor for direct admit.

## 2014-08-22 ENCOUNTER — Inpatient Hospital Stay (HOSPITAL_COMMUNITY): Payer: Medicare Other

## 2014-08-22 DIAGNOSIS — R1110 Vomiting, unspecified: Secondary | ICD-10-CM

## 2014-08-22 LAB — CBC
HCT: 41.7 % (ref 39.0–52.0)
Hemoglobin: 14 g/dL (ref 13.0–17.0)
MCH: 26.5 pg (ref 26.0–34.0)
MCHC: 33.6 g/dL (ref 30.0–36.0)
MCV: 79 fL (ref 78.0–100.0)
Platelets: 193 10*3/uL (ref 150–400)
RBC: 5.28 MIL/uL (ref 4.22–5.81)
RDW: 13.3 % (ref 11.5–15.5)
WBC: 8.2 10*3/uL (ref 4.0–10.5)

## 2014-08-22 LAB — BASIC METABOLIC PANEL
Anion gap: 14 (ref 5–15)
BUN: 20 mg/dL (ref 6–23)
CO2: 24 mEq/L (ref 19–32)
Calcium: 9.1 mg/dL (ref 8.4–10.5)
Chloride: 91 mEq/L — ABNORMAL LOW (ref 96–112)
Creatinine, Ser: 0.78 mg/dL (ref 0.50–1.35)
GFR calc Af Amer: >90 mL/min (ref >90)
GFR calc non Af Amer: 85 mL/min — ABNORMAL LOW (ref >90)
Glucose, Bld: 157 mg/dL — ABNORMAL HIGH (ref 70–99)
Potassium: 3.8 mEq/L (ref 3.7–5.3)
Sodium: 129 mEq/L — ABNORMAL LOW (ref 137–147)

## 2014-08-22 MED ORDER — PROMETHAZINE HCL 25 MG/ML IJ SOLN: 12.5000 mg | Freq: Four times a day (QID) | INTRAMUSCULAR | Status: DC | PRN

## 2014-08-22 NOTE — Consult Note (Signed)
Reason for Consult:  SBO vs enteritis Referring Physician: Dr. Rosalin Hawking is an 79 y.o. male.  HPI: 79 y/o with hx of prior inguinal hernia repair, who developed abdominal pain in the PM of 8/24.  He is kind of vague on history, but it started acutely after eating.  No nausea or vomiting with onset.  He says he became distended and then just started vomiting.  He had no issues before the acute onset Monday evening.  He presented to the ED.  He was still having distension and vomiting.  Pain going to flanks.  CT scan showed mild SB dilatation and small bowel wall thickening.  There was a question of enteritis.  He was started on IV antibiotics and then converted to Oral Cipro.  He was in hospital 8/25-8/26.  He says when he got home and ate he started throwing up.  He say the distension was still present when he went home. He continued to have abdominal distension, and vomiting.  He doesn't really complain of pain or nausea to me.  It sounds like he's just distended and throws up.  Worse with PO intake. He says he had a BM before admission on 8/25 , and he had one last PM before coming back to the ED.  He reports the BM last eveing was soft, but essentially normal. He came back to the ED last PM and was readmitted.  Film last night shows Marked fluid distension and Small bowel distension with multiple air fluid levels. He did not have an NG placed, but was ordered this AM.  I help place it and got what looks like 2 liters, may be more.  He vomited quite a bit when we placed the tube.  He feels much better now.  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   No prior history of SBO.  He has had a colonoscopy with some diverticulosis noted.  No prior hx of diverticulitis.  Past Medical History  Diagnosis Date  . Allergy   . Hyperlipidemia   . Hypertension   . Hyperglycemia   . ED (erectile dysfunction)   . Diverticulosis of colon (without mention of  hemorrhage) 03-01-2004, 04-04-2011    Colonoscopy  . Internal hemorrhoids 03-15-1999    Flex Sig   . Irritable bowel     Past Surgical History  Procedure Laterality Date  . Inguinal hernia repair    . Knee arthroscopy      right knee  . Nasal sinus surgery    . Eye surgery      bilateral cataract extraction per Dr Dolores Lory  . Colonoscopy  04-04-11    per Dr. Sharlett Iles, clear, no repeats needed    Family History  Problem Relation Age of Onset  . Hypertension Mother   . Lung cancer Father   . Colitis Daughter     Social History:  reports that he quit smoking about 45 years ago. He has never used smokeless tobacco. He reports that he drinks about 1.8 ounces of alcohol per week. He reports that he does not use illicit drugs.  Allergies: No Known Allergies  Medications:  Prior to Admission:  Prescriptions prior to admission  Medication Sig Dispense Refill  . bimatoprost (LUMIGAN) 0.01 % SOLN Place 1 drop into both eyes at bedtime.      . cetirizine (ZYRTEC) 10 MG tablet Take 10 mg by mouth daily.       Verneita Griffes  500 MG TABS Take 500 mg by mouth daily.       . Coenzyme Q10 (CO Q 10 PO) Take 1 capsule by mouth 2 (two) times daily.       Marland Kitchen diltiazem (CARDIZEM CD) 300 MG 24 hr capsule Take 1 capsule (300 mg total) by mouth daily.  90 capsule  3  . fish oil-omega-3 fatty acids 1000 MG capsule Take 1 g by mouth 3 (three) times daily.       . Flaxseed, Linseed, (FLAX SEED OIL PO) Take 1 capsule by mouth 3 (three) times daily.       . Glucosamine-Chondroitin (COSAMIN DS) 500-400 MG CAPS Take 1 tablet by mouth 2 (two) times daily.      Marland Kitchen levobunolol (BETAGAN) 0.5 % ophthalmic solution Place 1 drop into both eyes every morning.       . Multiple Vitamins-Minerals (MULTIVITAMIN WITH MINERALS) tablet Take 1 tablet by mouth 2 (two) times daily.       . sildenafil (VIAGRA) 100 MG tablet Take 100 mg by mouth daily as needed for erectile dysfunction.      Marland Kitchen terazosin (HYTRIN) 2 MG capsule Take 4 mg by  mouth at bedtime.      . ciprofloxacin (CIPRO) 500 MG tablet Take 1 tablet (500 mg total) by mouth 2 (two) times daily.  14 tablet  0  . Hydrocodone-Acetaminophen (VICODIN) 5-300 MG TABS Take 1 tablet by mouth every 6 (six) hours as needed.  30 each  0  . sildenafil (VIAGRA) 100 MG tablet TAKE 1 TABLET (100 MG TOTAL) AS NEEDED FOR ERECTILE DYSFUNCTION  30 tablet  3   Scheduled: . ciprofloxacin  400 mg Intravenous Q12H  . diltiazem  300 mg Oral Daily  . enoxaparin (LOVENOX) injection  40 mg Subcutaneous Q24H  . latanoprost  1 drop Both Eyes QHS  . levobunolol  1 drop Both Eyes Daily  . metronidazole  500 mg Intravenous Q8H  . pantoprazole (PROTONIX) IV  40 mg Intravenous Q24H  . terazosin  4 mg Oral QHS   Continuous: . sodium chloride 75 mL/hr at 08/21/14 2023   WUX:[id number], HYDROmorphone (DILAUDID) injection, ondansetron (ZOFRAN) IV, ondansetron, promethazine Anti-infectives   Start     Dose/Rate Route Frequency Ordered Stop   08/21/14 2200  ciprofloxacin (CIPRO) IVPB 400 mg     400 mg 200 mL/hr over 60 Minutes Intravenous Every 12 hours 08/21/14 1956     08/21/14 2200  metroNIDAZOLE (FLAGYL) IVPB 500 mg     500 mg 100 mL/hr over 60 Minutes Intravenous Every 8 hours 08/21/14 1956        Results for orders placed during the hospital encounter of 08/21/14 (from the past 48 hour(s))  TSH     Status: None   Collection Time    08/21/14  8:16 PM      Result Value Ref Range   TSH 0.802  0.350 - 4.500 uIU/mL   Comment: Performed at [facility]     Status: None   Collection Time    08/21/14  8:17 PM      Result Value Ref Range   Magnesium 1.9  1.5 - 2.5 mg/dL  PHOSPHORUS     Status: None   Collection Time    08/21/14  8:17 PM      Result Value Ref Range   Phosphorus 3.1  2.3 - 4.6 mg/dL  COMPREHENSIVE METABOLIC PANEL     Status: Abnormal   Collection Time  08/21/14  8:17 PM      Result Value Ref Range   Sodium 132 (*) 137 - 147 mEq/L   Potassium  3.7  3.7 - 5.3 mEq/L   Chloride 89 (*) 96 - 112 mEq/L   Comment: DELTA CHECK NOTED   CO2 27  19 - 32 mEq/L   Glucose, Bld 197 (*) 70 - 99 mg/dL   BUN 20  6 - 23 mg/dL   Creatinine, Ser 0.97  0.50 - 1.35 mg/dL   Calcium 10.1  8.4 - 10.5 mg/dL   Total Protein 7.5  6.0 - 8.3 g/dL   Albumin 4.3  3.5 - 5.2 g/dL   AST 35  0 - 37 U/L   ALT 17  0 - 53 U/L   Alkaline Phosphatase 45  39 - 117 U/L   Total Bilirubin 0.7  0.3 - 1.2 mg/dL   GFR calc non Af Amer 78 (*) >90 mL/min   GFR calc Af Amer >90  >90 mL/min   Comment: (NOTE)     The eGFR has been calculated using the CKD EPI equation.     This calculation has not been validated in all clinical situations.     eGFR's persistently <90 mL/min signify possible Chronic Kidney     Disease.   Anion gap 16 (*) 5 - 15  CBC     Status: None   Collection Time    08/21/14  8:17 PM      Result Value Ref Range   WBC 10.3  4.0 - 10.5 K/uL   RBC 5.39  4.22 - 5.81 MIL/uL   Hemoglobin 14.6  13.0 - 17.0 g/dL   HCT 43.2  39.0 - 52.0 %   MCV 80.1  78.0 - 100.0 fL   MCH 27.1  26.0 - 34.0 pg   MCHC 33.8  30.0 - 36.0 g/dL   RDW 13.3  11.5 - 15.5 %   Platelets 195  150 - 400 K/uL  LIPASE, BLOOD     Status: None   Collection Time    08/21/14  8:17 PM      Result Value Ref Range   Lipase 26  11 - 59 U/L  BASIC METABOLIC PANEL     Status: Abnormal   Collection Time    08/22/14  4:05 AM      Result Value Ref Range   Sodium 129 (*) 137 - 147 mEq/L   Potassium 3.8  3.7 - 5.3 mEq/L   Chloride 91 (*) 96 - 112 mEq/L   CO2 24  19 - 32 mEq/L   Glucose, Bld 157 (*) 70 - 99 mg/dL   BUN 20  6 - 23 mg/dL   Creatinine, Ser 0.78  0.50 - 1.35 mg/dL   Calcium 9.1  8.4 - 10.5 mg/dL   GFR calc non Af Amer 85 (*) >90 mL/min   GFR calc Af Amer >90  >90 mL/min   Comment: (NOTE)     The eGFR has been calculated using the CKD EPI equation.     This calculation has not been validated in all clinical situations.     eGFR's persistently <90 mL/min signify possible  Chronic Kidney     Disease.   Anion gap 14  5 - 15  CBC     Status: None   Collection Time    08/22/14  4:05 AM      Result Value Ref Range   WBC 8.2  4.0 - 10.5 K/uL  RBC 5.28  4.22 - 5.81 MIL/uL   Hemoglobin 14.0  13.0 - 17.0 g/dL   HCT 41.7  39.0 - 52.0 %   MCV 79.0  78.0 - 100.0 fL   MCH 26.5  26.0 - 34.0 pg   MCHC 33.6  30.0 - 36.0 g/dL   RDW 13.3  11.5 - 15.5 %   Platelets 193  150 - 400 K/uL    Dg Abd Acute W/chest  08/21/2014   CLINICAL DATA:  Abdominal pain.  Nausea, vomiting, and fever.  EXAM: ACUTE ABDOMEN SERIES (ABDOMEN 2 VIEW & CHEST 1 VIEW)  COMPARISON:  Chest 04/29/2005.  CT abdomen and pelvis 08/20/2014.  FINDINGS: Mild cardiac enlargement without vascular congestion. Atelectasis in the lung bases. No blunting of costophrenic angles. No pneumothorax. Old right rib fractures.  Gas-filled distended small bowel in the right upper quadrant with gaseous distention of the stomach and multiple air-fluid levels. Changes suggest proximal small bowel obstruction. Residual contrast material is noted in the bowel. No free intra-abdominal air.  IMPRESSION: Fluid distention of stomach with gas distention of small bowel in the right upper quadrant suggesting proximal small bowel obstruction.   Electronically Signed   By: Lucienne Capers M.D.   On: 08/21/2014 23:59    Review of Systems  Constitutional: Negative.   HENT: Negative.   Respiratory: Negative.   Cardiovascular: Negative.   Gastrointestinal: Positive for heartburn, vomiting and abdominal pain. Negative for diarrhea, constipation, blood in stool and melena.  Genitourinary: Negative.   Musculoskeletal: Negative.   Skin: Negative.   Neurological: Negative.   Endo/Heme/Allergies: Negative.   Psychiatric/Behavioral: Negative.    Blood pressure 144/83, pulse 66, temperature 98.2 F (36.8 C), temperature source Oral, resp. rate 18, height $RemoveBe'6\' 1"'qYoQHmpUK$  (1.854 m), weight 93.214 kg (205 lb 8 oz), SpO2 98.00%. Physical Exam   Constitutional: He is oriented to person, place, and time. He appears well-developed and well-nourished. He appears distressed (he is significantly distended and having reflux with small amounts of green fluid coming up and says it burns .).  HENT:  Head: Normocephalic and atraumatic.  Nose: Nose normal.  Eyes: Conjunctivae and EOM are normal. Pupils are equal, round, and reactive to light. Right eye exhibits no discharge. Left eye exhibits no discharge.  Neck: Normal range of motion. Neck supple. No JVD present. No tracheal deviation present. No thyromegaly present.  Cardiovascular: Regular rhythm, normal heart sounds and intact distal pulses.  Exam reveals no gallop.   No murmur heard. Respiratory: Effort normal and breath sounds normal. No respiratory distress. He has no wheezes. He has no rales. He exhibits no tenderness.  GI: He exhibits distension (he has significant abdominal distension.). He exhibits no mass. There is tenderness. There is no rebound and no guarding.  Musculoskeletal: He exhibits no edema.  Lymphadenopathy:    He has no cervical adenopathy.  Neurological: He is alert and oriented to person, place, and time. Coordination normal.  Skin: Skin is warm and dry. No rash noted. He is not diaphoretic. No erythema. No pallor.  Psychiatric: He has a normal mood and affect. His behavior is normal. Thought content normal.    Assessment/Plan: 1.  SBO/gastric distension vs enteritis with obstruction 2.  Hx of prior [facility] repair 3.  Hypertension 4.  Hx of diverticulosis and internal hemorrhoids, no hx of diverticulitis 5.  Hx of hyperglycemia 6.  IBS on list but pt denies any diagnosis of this in the past. 7.  ED   Imp:  SBO vs enteritis.  He doesn't give an good hx for enteritis, this came on all one evening, no fever, no WBC elevation.  His only surgery was the [facility] repair.  I would add flagyl which has already been done to cover GI enteritis.  NG is in and I will repeat film.  i  am going to get a 3 way just to cover concerns for aspiration.  I will talk with Dr. Dalbert Batman about repeat CT scan.   Cherrill Scrima 08/22/2014, 10:46 AM

## 2014-08-22 NOTE — Progress Notes (Signed)
Patient ID: [name] [name], male   DOB: [birth year], 79 y.o.   MRN: [id number]  [facility] PROGRESS NOTE  [name] [name]:[id number] DOB: [birth year] DOA: 2014-09-01 PCP: [name], MD  Brief narrative: Pt is 79 yo male who was discharged yesterday after being admitted for abd pain and was diagnosed with presumptive enteritis. Pt reports he felt better yesterday but as he continued to advance his diet he started to get more sick with more nausea and non bloody vomiting. Pt denies similar events in the past, no recent sick contacts or exposures. Pt reports abd pain is crampy like, intermittent and 5/10 in severity, worse with eating solids, no other specific alleviating or aggravating factors. Pt admitted from home after talking to myself.   Assessment and Plan:  Active Problems:  SBO - abd more distended this am, pt still vomiting  - discuss with pt placing NGT this AM and he agrees as he is not doing better - will consult surgery as well for further input - not sure we need ABX but recent CT findings were suggestive of an infectious etiology - pt is afebrile and with no leukocytosis but has been on ABX since August 26th, 2015  - will discuss with GI as well for further input  - provide analgesia and antiemetics as needed - keep NPO HTN - reasonable inpatient control  Hyponatremia - secondary to pre renal etiology - provide IVF and repeat BMP in AM  DVT prophylaxis  Lovenox SQ while pt is in hospital  Code Status: Full Family Communication: Pt at bedside Disposition Plan: Home when medically stable  IV Access:   Peripheral IV Procedures and diagnostic studies:   Dg Abd Acute W/chest  09-01-14    Fluid distention of stomach with gas distention of small bowel in the right upper quadrant suggesting proximal small bowel obstruction.    Medical Consultants:   Surgery  Other Consultants:   None  Anti-Infectives:   Cipro Sep 02, 2023 --> Flagyl 09-02-23 -->  Debbora Presto, MD  [facility] Pager [phone]  If 7PM-7AM, please contact night-coverage [email or web address] Password [facility] 08/22/2014, 9:49 AM   LOS: 1 day   HPI/Subjective: No events overnight.   Objective: Filed Vitals:   09/01/14 1949 08/22/14 0535  BP: 145/93 144/83  Pulse: 85 66  Temp: 97.5 F (36.4 C) 98.2 F (36.8 C)  TempSrc: Oral Oral  Resp: 16 18  Height:  (1.854 m)   Weight: 93.214 kg (205 lb 8 oz)   SpO2: 96% 98%    Intake/Output Summary (Last 24 hours) at 08/22/14 0949 Last data filed at 08/22/14 0400  Gross per 24 hour  Intake 871.25 ml  Output      0 ml  Net 871.25 ml    Exam:   General:  Pt is alert, follows commands appropriately, in mild distress due to vomiting   Cardiovascular: Regular rate and rhythm, S1/S2, no rubs, no gallops  Respiratory: Clear to auscultation bilaterally, no wheezing, diminished breath sounds at bases  Abdomen: Slightly hard, tender in epigastric area, still significantly distended, no guarding  Extremities: pulses DP and PT palpable bilaterally  Neuro: Grossly nonfocal  Data Reviewed: Basic Metabolic Panel:  Recent Labs Lab 08/19/14 2208 08/20/14 0510 Sep 01, 2014 2017 08/22/14 0405  NA 140 136* 132* 129*  K 3.8 4.0 3.7 3.8  CL 97 98 89* 91*  CO2 GLUCOSE 197* 194* 197* 157*  BUN CREATININE 0.88 0.87 0.97  0.78  CALCIUM 10.2 9.1 10.1 9.1  MG  --   --  1.9  --   PHOS  --   --  3.1  --    Liver Function Tests:  Recent Labs Lab 08/19/14 2208 08/20/14 0510 08/21/14 2017  AST 27 21 35  ALT ALKPHOS 51 42 45  BILITOT 0.4 0.4 0.7  PROT 7.9 6.5 7.5  ALBUMIN 4.8 3.7 4.3    Recent Labs Lab 08/19/14 2208 08/20/14 0510 08/21/14 2017  LIPASE CBC:  Recent Labs Lab 08/19/14 2208 08/20/14 0510 08/21/14 2017 08/22/14 0405  WBC 7.0 7.6 10.3 8.2  NEUTROABS 5.5 6.5  --   --   HGB 15.0 14.0 14.6 14.0  HCT 45.6 43.2 43.2 41.7  MCV 80.9 82.0 80.1 79.0  PLT 222 175 195  193  CBG:  Recent Labs Lab 08/20/14 0738 08/20/14 1145  GLUCAP 189* 158*    Scheduled Meds: . ciprofloxacin  400 mg Intravenous Q12H  . diltiazem  300 mg Oral Daily  . enoxaparin (LOVENOX) injection  40 mg Subcutaneous Q24H  . latanoprost  1 drop Both Eyes QHS  . levobunolol  1 drop Both Eyes Daily  . metronidazole  500 mg Intravenous Q8H  . pantoprazole (PROTONIX) IV  40 mg Intravenous Q24H  . terazosin  4 mg Oral QHS   Continuous Infusions: . sodium chloride 75 mL/hr at 08/21/14 2023

## 2014-08-22 NOTE — Care Management Note (Signed)
CARE MANAGEMENT NOTE 08/22/2014  Patient:  [name] [name], [name] [name]   Account Number:  [id number]  Date Initiated:  08/22/2014  Documentation initiated by:  Berenda Morale  Subjective/Objective Assessment:   79 yo admitted with abd pain and SBO     Action/Plan:   From home with wife.   Anticipated DC Date:  08/25/2014   Anticipated DC Plan:  HOME/SELF CARE      DC Planning Services  CM consult      Choice offered to / List presented to:             Status of service:  In process, will continue to follow Medicare Important Message given?   (If response is "NO", the following Medicare IM given date fields will be blank) Date Medicare IM given:   Medicare IM given by:   Date Additional Medicare IM given:   Additional Medicare IM given by:    Discharge Disposition:    Per UR Regulation:  Reviewed for med. necessity/level of care/duration of stay  If discussed at Long Length of Stay Meetings, dates discussed:    Comments:  08/22/14 Sandford Craze RN,BSN,NCM Pt with SBO, NGT and IVF.  Will continue to follow for DC needs.

## 2014-08-22 NOTE — Consult Note (Addendum)
Gen. Surgery:  I have interviewed and examined this patient. I reviewed his history, chart information and imaging studies. I agree with the assessment and treatment plan outlined by Mr. Marlyne Beards, [place].  This gentleman's  abdominal pain nausea and vomiting is of recent onset. Initial imaging concern was for a enteritis, not a small bowel obstruction. He has not had a bowel movement for several days and just passes a little bit of flatus each day. No diarrhea. Denies prior intra-abdominal surgery.  Imaging studies today show acute gastric distention and one loop of small bowel which looked somewhat distended.  When the NG tube was placed, 2 L of bilious fluid were removed and he feels much better. He denies pain and is quite comfortable now.  Abdominal exam reveals no scars or hernias. He is nontender. Bowel sounds hypoactive. A little bit distended  Plan: Continue NG suction overnight Agree with proton pump inhibitors Abdominal x-rays tomorrow I am not sure whether  this is a gastroenteritis or SBO., and so I am not sure whether the Cipro and Flagyl are going to be efficacious, but I do not oppose their use Consider repeat CT scan this weekend. Advise gastroenterology consult.   Angelia Mould. Derrell Lolling, M.D., [facility] General and Minimally invasive Surgery Breast and Colorectal Surgery Office:   [phone] Pager:   [phone]

## 2014-08-23 ENCOUNTER — Inpatient Hospital Stay (HOSPITAL_COMMUNITY): Payer: Medicare Other

## 2014-08-23 DIAGNOSIS — K5900 Constipation, unspecified: Secondary | ICD-10-CM

## 2014-08-23 LAB — BASIC METABOLIC PANEL
Anion gap: 11 (ref 5–15)
BUN: 22 mg/dL (ref 6–23)
CO2: 28 mEq/L (ref 19–32)
Calcium: 8.6 mg/dL (ref 8.4–10.5)
Chloride: 95 mEq/L — ABNORMAL LOW (ref 96–112)
Creatinine, Ser: 1.2 mg/dL (ref 0.50–1.35)
GFR calc Af Amer: 66 mL/min — ABNORMAL LOW (ref >90)
GFR calc non Af Amer: 57 mL/min — ABNORMAL LOW (ref >90)
Glucose, Bld: 135 mg/dL — ABNORMAL HIGH (ref 70–99)
Potassium: 3.2 mEq/L — ABNORMAL LOW (ref 3.7–5.3)
Sodium: 134 mEq/L — ABNORMAL LOW (ref 137–147)

## 2014-08-23 LAB — CBC
HCT: 36.3 % — ABNORMAL LOW (ref 39.0–52.0)
Hemoglobin: 12.4 g/dL — ABNORMAL LOW (ref 13.0–17.0)
MCH: 27.1 pg (ref 26.0–34.0)
MCHC: 34.2 g/dL (ref 30.0–36.0)
MCV: 79.3 fL (ref 78.0–100.0)
Platelets: 177 10*3/uL (ref 150–400)
RBC: 4.58 MIL/uL (ref 4.22–5.81)
RDW: 13.2 % (ref 11.5–15.5)
WBC: 7.3 10*3/uL (ref 4.0–10.5)

## 2014-08-23 LAB — URINE CULTURE
Colony Count: NO GROWTH
Culture: NO GROWTH

## 2014-08-23 MED ORDER — POTASSIUM CHLORIDE 10 MEQ/100ML IV SOLN
10.0000 meq | INTRAVENOUS | Status: AC
  Administered 2014-08-23 (×4): 10 meq via INTRAVENOUS
  Filled 2014-08-23 (×4): qty 100

## 2014-08-23 MED ORDER — BISACODYL 10 MG RE SUPP
10.0000 mg | Freq: Every day | RECTAL | Status: DC | PRN
  Administered 2014-08-23: 10 mg via RECTAL
  Filled 2014-08-23: qty 1

## 2014-08-23 NOTE — Progress Notes (Signed)
Patient ID: [name] [name], male   DOB: Mar 07, 1935, 79 y.o.   MRN: [id number]  [facility] PROGRESS NOTE  [name] [name] PPI:[id number] DOB: [birth year] DOA: 08/21/2014 PCP: [name], MD  Brief narrative:  Pt is 79 yo male who was discharged yesterday after being admitted for abd pain and was diagnosed with presumptive enteritis. Pt reports he felt better yesterday but as he continued to advance his diet he started to get more sick with more nausea and non bloody vomiting. Pt denies similar events in the past, no recent sick contacts or exposures. Pt reports abd pain is crampy like, intermittent and 5/10 in severity, worse with eating solids, no other specific alleviating or aggravating factors. Pt admitted from home after talking to myself.   Assessment and Plan:  Active Problems:  SBO  - NGT in place and pt reports feeling better this AM - plan for repeat abx XRAY - appreciate surgery input  - pt remains afebrile and with no leukocytosis but has been on ABX since August 26th, 2015  - continue empiric ABX for now  - provide analgesia and antiemetics as needed  - keep NPO  HTN - BP on soft side this AM Hyponatremia  - secondary to pre renal etiology  - sodium improving: 129 --> 134 this AM Hypokalemia - mild, supplement and repeat BMP in AM Acute blood loss anemia - likely dilutional - no signs of active bleeding - repeat CBC in AM  DVT prophylaxis  Lovenox SQ while pt is in hospital  Code Status: Full  Family Communication: Pt at bedside  Disposition Plan: Home when medically stable   IV Access:   Peripheral IV Procedures and diagnostic studies:   Dg Abd Acute W/chest 08/21/2014 Fluid distention of stomach with gas distention of small bowel in the right upper quadrant suggesting proximal small bowel obstruction.  Medical Consultants:   Surgery  Other Consultants:   None  Anti-Infectives:   Cipro 8/27 -->  Flagyl 8/27 -->  Debbora Presto,  MD  [facility] Pager [phone]  If 7PM-7AM, please contact night-coverage [email or web address] Password [email or web address] 08/23/2014, 10:00 AM   LOS: 2 days   HPI/Subjective: No events overnight.   Objective: Filed Vitals:   08/22/14 0535 08/22/14 1400 08/22/14 2228 08/23/14 0611  BP: 144/83 112/64 115/61 114/56  Pulse: 66 67 63 51  Temp: 98.2 F (36.8 C) 98.3 F (36.8 C) 98.4 F (36.9 C) 98.1 F (36.7 C)  TempSrc: Oral Oral Oral Oral  Resp: Height:      Weight:      SpO2: 98% 99% 98% 95%    Intake/Output Summary (Last 24 hours) at 08/23/14 1000 Last data filed at 08/23/14 0930  Gross per 24 hour  Intake 2188.75 ml  Output   4800 ml  Net -2611.25 ml    Exam:   General:  Pt is alert, follows commands appropriately, not in acute distress  Cardiovascular: Regular rhythm, bradycardic, S1/S2, no rubs, no gallops  Respiratory: Clear to auscultation bilaterally, no wheezing, no crackles, no rhonchi  Abdomen: Softer, non tender, less distended, bowel sounds present, no guarding  Extremities: No edema, pulses DP and PT palpable bilaterally  Neuro: Grossly nonfocal  Data Reviewed: Basic Metabolic Panel:  Recent Labs Lab 08/19/14 2208 08/20/14 0510 08/21/14 2017 08/22/14 0405 08/23/14 0020  NA 140 136* 132* 129* 134*  K 3.8 4.0 3.7 3.8 3.2*  CL 97 98 89* 91* 95*  CO2 28  GLUCOSE 197* 194* 197* 157* 135*  BUN CREATININE 0.88 0.87 0.97 0.78 1.20  CALCIUM 10.2 9.1 10.1 9.1 8.6  MG  --   --  1.9  --   --   PHOS  --   --  3.1  --   --    Liver Function Tests:  Recent Labs Lab 08/19/14 2208 08/20/14 0510 08/21/14 2017  AST 27 21 35  ALT ALKPHOS 51 42 45  BILITOT 0.4 0.4 0.7  PROT 7.9 6.5 7.5  ALBUMIN 4.8 3.7 4.3    Recent Labs Lab 08/19/14 2208 08/20/14 0510 08/21/14 2017  LIPASE No results found for this basename: AMMONIA,  in the last 168 hours CBC:  Recent Labs Lab 08/19/14 2208 08/20/14 0510  08/21/14 2017 08/22/14 0405 08/23/14 0020  WBC 7.0 7.6 10.3 8.2 7.3  NEUTROABS 5.5 6.5  --   --   --   HGB 15.0 14.0 14.6 14.0 12.4*  HCT 45.6 43.2 43.2 41.7 36.3*  MCV 80.9 82.0 80.1 79.0 79.3  PLT 222 175 195 193 177    CBG:  Recent Labs Lab 08/20/14 0738 08/20/14 1145  GLUCAP 189* 158*     Scheduled Meds: . ciprofloxacin  400 mg Intravenous Q12H  . diltiazem  300 mg Oral Daily  . enoxaparin (LOVENOX) injection  40 mg Subcutaneous Q24H  . latanoprost  1 drop Both Eyes QHS  . levobunolol  1 drop Both Eyes Daily  . metronidazole  500 mg Intravenous Q8H  . pantoprazole (PROTONIX) IV  40 mg Intravenous Q24H  . terazosin  4 mg Oral QHS   Continuous Infusions: . sodium chloride 75 mL/hr at 08/21/14 2023

## 2014-08-23 NOTE — Progress Notes (Signed)
  Subjective: No complaints no flatus or BM Denies significant abdominal pain  Objective: Vital signs in last 24 hours: Temp:  [98.1 F (36.7 C)-98.4 F (36.9 C)] 98.1 F (36.7 C) (08/29 0611) Pulse Rate:  [51-67] 51 (08/29 0611) Resp:  [16-18] 18 (08/29 0611) BP: (112-115)/(56-64) 114/56 mmHg (08/29 0611) SpO2:  [95 %-99 %] 95 % (08/29 0611) Last BM Date: 08/21/14  Intake/Output from previous day: 08/28 0701 - 08/29 0700 In: 2188.8 [I.V.:1888.8; IV Piggyback:300] Out: 4400 [Urine:800; Emesis/NG output:3600] Intake/Output this shift:    GI: soft non tender quiet moderate distention no rebound or guarding.  Lab Results:   Recent Labs  08/22/14 0405 08/23/14 0020  WBC 8.2 7.3  HGB 14.0 12.4*  HCT 41.7 36.3*  PLT 193 177   BMET  Recent Labs  08/22/14 0405 08/23/14 0020  NA 129* 134*  K 3.8 3.2*  CL 91* 95*  CO2 24 28  GLUCOSE 157* 135*  BUN 20 22  CREATININE 0.78 1.20  CALCIUM 9.1 8.6   PT/INR No results found for this basename: LABPROT, INR,  in the last 72 hours ABG No results found for this basename: PHART, PCO2, PO2, HCO3,  in the last 72 hours  Studies/Results: Dg Abd Acute W/chest  08/22/2014   CLINICAL DATA:  Small bowel obstruction.  EXAM: ACUTE ABDOMEN SERIES (ABDOMEN 2 VIEW & CHEST 1 VIEW)  COMPARISON:  August 21, 2014.  FINDINGS: Interval placement of nasogastric tube with tip in body of stomach. Dilated small bowel loop is noted with air-fluid level concerning for small bowel obstruction. Stool is noted throughout the colon. Phleboliths are noted in the pelvis.  IMPRESSION: Stable dilated small bowel loops seen in right upper quadrant of abdomen consistent with small bowel obstruction. Interval placement of nasogastric tube.   Electronically Signed   By: Roque Lias M.D.   On: 08/22/2014 11:57   Dg Abd Acute W/chest  08/21/2014   CLINICAL DATA:  Abdominal pain.  Nausea, vomiting, and fever.  EXAM: ACUTE ABDOMEN SERIES (ABDOMEN 2 VIEW & CHEST 1  VIEW)  COMPARISON:  Chest 04/29/2005.  CT abdomen and pelvis 08/20/2014.  FINDINGS: Mild cardiac enlargement without vascular congestion. Atelectasis in the lung bases. No blunting of costophrenic angles. No pneumothorax. Old right rib fractures.  Gas-filled distended small bowel in the right upper quadrant with gaseous distention of the stomach and multiple air-fluid levels. Changes suggest proximal small bowel obstruction. Residual contrast material is noted in the bowel. No free intra-abdominal air.  IMPRESSION: Fluid distention of stomach with gas distention of small bowel in the right upper quadrant suggesting proximal small bowel obstruction.   Electronically Signed   By: Burman Nieves M.D.   On: 08/21/2014 23:59    Anti-infectives: Anti-infectives   Start     Dose/Rate Route Frequency Ordered Stop   08/21/14 2200  ciprofloxacin (CIPRO) IVPB 400 mg     400 mg 200 mL/hr over 60 Minutes Intravenous Every 12 hours 08/21/14 1956     08/21/14 2200  metroNIDAZOLE (FLAGYL) IVPB 500 mg     500 mg 100 mL/hr over 60 Minutes Intravenous Every 8 hours 08/21/14 1956        Assessment/Plan: SBO vs obstipation no history of previous abdominal surgery that I can see CT scan shows colon full of stool. Repeat plain films today Dulcolax suppository  May add fleets enema Cont NGT/ bowel rest  LOS: 2 days    Wilferd Ritson A. 08/23/2014

## 2014-08-24 ENCOUNTER — Inpatient Hospital Stay (HOSPITAL_COMMUNITY): Payer: Medicare Other

## 2014-08-24 DIAGNOSIS — K56609 Unspecified intestinal obstruction, unspecified as to partial versus complete obstruction: Secondary | ICD-10-CM

## 2014-08-24 LAB — COMPREHENSIVE METABOLIC PANEL
ALT: 24 U/L (ref 0–53)
AST: 31 U/L (ref 0–37)
Albumin: 3.1 g/dL — ABNORMAL LOW (ref 3.5–5.2)
Alkaline Phosphatase: 38 U/L — ABNORMAL LOW (ref 39–117)
Anion gap: 9 (ref 5–15)
BUN: 15 mg/dL (ref 6–23)
CO2: 31 mEq/L (ref 19–32)
Calcium: 8.7 mg/dL (ref 8.4–10.5)
Chloride: 100 mEq/L (ref 96–112)
Creatinine, Ser: 1.09 mg/dL (ref 0.50–1.35)
GFR calc Af Amer: 74 mL/min — ABNORMAL LOW (ref >90)
GFR calc non Af Amer: 64 mL/min — ABNORMAL LOW (ref >90)
Glucose, Bld: 104 mg/dL — ABNORMAL HIGH (ref 70–99)
Potassium: 3.7 mEq/L (ref 3.7–5.3)
Sodium: 140 mEq/L (ref 137–147)
Total Bilirubin: 0.6 mg/dL (ref 0.3–1.2)
Total Protein: 5.8 g/dL — ABNORMAL LOW (ref 6.0–8.3)

## 2014-08-24 NOTE — Progress Notes (Signed)
Subjective: Had large BM yesterday. NGT output less.   Objective: Vital signs in last 24 hours: Temp:  [98 F (36.7 C)-99.1 F (37.3 C)] 98 F (36.7 C) (08/30 0451) Pulse Rate:  [61-88] 61 (08/30 0451) Resp:  [16-18] 16 (08/30 0451) BP: (99-120)/(59-67) 120/66 mmHg (08/30 0451) SpO2:  [97 %-98 %] 97 % (08/30 0451) Last BM Date: 08/21/14  Intake/Output from previous day: 08/29 0701 - 08/30 0700 In: 2298.8 [I.V.:1898.8; IV Piggyback:400] Out: 2275 [Urine:1825; Emesis/NG output:450] Intake/Output this shift:    GI: soft less distended. quiet  Lab Results:   Recent Labs  08/22/14 0405 08/23/14 0020  WBC 8.2 7.3  HGB 14.0 12.4*  HCT 41.7 36.3*  PLT 193 177   BMET  Recent Labs  08/22/14 0405 08/23/14 0020  NA 129* 134*  K 3.8 3.2*  CL 91* 95*  CO2 24 28  GLUCOSE 157* 135*  BUN 20 22  CREATININE 0.78 1.20  CALCIUM 9.1 8.6   PT/INR No results found for this basename: LABPROT, INR,  in the last 72 hours ABG No results found for this basename: PHART, PCO2, PO2, HCO3,  in the last 72 hours  Studies/Results: Dg Chest 2 View  08/23/2014   CLINICAL DATA:  Cough.  Chest pain.  EXAM: CHEST  2 VIEW  COMPARISON:  08/22/2014 .  FINDINGS: NG tube noted: Stomach. Mediastinum and hilar structures are normal. Bibasilar atelectasis. Cardiomegaly with normal pulmonary vascularity. No pleural effusion or pneumothorax. Bowel distention is again noted. The stomach is nondistended.  IMPRESSION: 1. NG tube noted coiled in stomach. No gastric distention. Small bowel distention again noted. Abdominal series can be obtained to further evaluate. 2. Bibasilar atelectasis. 3. Cardiomegaly.  No CHF.   Electronically Signed   By: Maisie Fus  Register   On: 08/23/2014 08:44   Dg Abd 2 Views  08/23/2014   CLINICAL DATA:  Abdominal distension  EXAM: ABDOMEN - 2 VIEW  COMPARISON:  08/22/2014  FINDINGS: A nasogastric catheter is again noted within the stomach. Scattered large and small bowel gas is  seen. Contrast from prior CT is now noted within the colon indicating the dilated small bowel is related to a partial small bowel obstruction the degree of small bowel dilatation is relatively stable from the prior exam. No free air is seen.  IMPRESSION: Changes consistent with a partial small bowel obstruction.   Electronically Signed   By: Alcide Clever M.D.   On: 08/23/2014 08:43   Dg Abd Acute W/chest  08/22/2014   CLINICAL DATA:  Small bowel obstruction.  EXAM: ACUTE ABDOMEN SERIES (ABDOMEN 2 VIEW & CHEST 1 VIEW)  COMPARISON:  August 21, 2014.  FINDINGS: Interval placement of nasogastric tube with tip in body of stomach. Dilated small bowel loop is noted with air-fluid level concerning for small bowel obstruction. Stool is noted throughout the colon. Phleboliths are noted in the pelvis.  IMPRESSION: Stable dilated small bowel loops seen in right upper quadrant of abdomen consistent with small bowel obstruction. Interval placement of nasogastric tube.   Electronically Signed   By: Roque Lias M.D.   On: 08/22/2014 11:57    Anti-infectives: Anti-infectives   Start     Dose/Rate Route Frequency Ordered Stop   08/21/14 2200  ciprofloxacin (CIPRO) IVPB 400 mg     400 mg 200 mL/hr over 60 Minutes Intravenous Every 12 hours 08/21/14 1956     08/21/14 2200  metroNIDAZOLE (FLAGYL) IVPB 500 mg     500 mg 100 mL/hr over 60  Minutes Intravenous Every 8 hours 08/21/14 1956        Assessment/Plan: pSBO  Had BM yesterday and NGT output less.  Contrast in colon.  Benign exam.  Hopefully remove NGT Monday.  Will give enema for stool burden in colon.  Hypokalemia will need to replace Recheck labs   LOS: 3 days    [name] [name] A. 08/24/2014

## 2014-08-24 NOTE — Progress Notes (Signed)
Patient ID: ROLLAN [name], male   DOB: 10/31/1935, 79 y.o.   MRN: [id number]  [facility] PROGRESS NOTE  [name] [name]:[id number] DOB: [birth year] DOA: 08/21/2014 PCP: [name], MD  Brief narrative:  Pt is 79 yo male who was discharged yesterday after being admitted for abd pain and was diagnosed with presumptive enteritis. Pt reports he felt better yesterday but as he continued to advance his diet he started to get more sick with more nausea and non bloody vomiting. Pt denies similar events in the past, no recent sick contacts or exposures. Pt reports abd pain is crampy like, intermittent and 5/10 in severity, worse with eating solids, no other specific alleviating or aggravating factors. Pt admitted from home after talking to myself.   Assessment and Plan:  Active Problems:  SBO  - NGT in place with continuous drainage, pt reports feeling better this AM  - repeat abx XRAY with persistent SBO - appreciate surgery input  - pt remains afebrile and with no leukocytosis but has been on ABX since August 26th, 2015  - continue empiric ABX for now  - provide analgesia and antiemetics as needed  - keep NPO for now  HTN - reasonable inpatient control  Hyponatremia  - secondary to pre renal etiology  - sodium improving: 129 --> 134 --> 140 this AM  Hypokalemia  - mild, supplemented and WNL this AM Acute blood loss anemia  - likely dilutional  - no signs of active bleeding  - repeat CBC in AM   DVT prophylaxis  Lovenox SQ while pt is in hospital  Code Status: Full  Family Communication: Pt at bedside  Disposition Plan: Home when medically stable   IV Access:   Peripheral IV Procedures and diagnostic studies:   Dg Abd Acute W/chest 08/21/2014 Fluid distention of stomach with gas distention of small bowel in the right upper quadrant suggesting proximal small bowel obstruction.  Dg Abd 1 View  08/24/2014  Stable enteric tube. Stable bowel gas pattern since yesterday with  suggestion of mild or partial residual small bowel obstruction.    Medical Consultants:   Surgery  Other Consultants:   None  Anti-Infectives:   Cipro 8/27 -->  Flagyl 8/27 -->  Debbora Presto, MD  [facility] Pager [phone]  If 7PM-7AM, please contact night-coverage [email or web address] Password [email or web address] 08/24/2014, 2:13 PM   LOS: 3 days   HPI/Subjective: No events overnight.   Objective: Filed Vitals:   08/23/14 1419 08/23/14 2040 08/24/14 0451 08/24/14 1002  BP: 99/59 116/67 120/66 135/74  Pulse: 71 88 61   Temp: 98.2 F (36.8 C) 99.1 F (37.3 C) 98 F (36.7 C)   TempSrc: Oral Oral Oral   Resp: Height:      Weight:      SpO2: 98% 97% 97%     Intake/Output Summary (Last 24 hours) at 08/24/14 1413 Last data filed at 08/24/14 0900  Gross per 24 hour  Intake 2658.75 ml  Output   1875 ml  Net 783.75 ml    Exam:   General:  Pt is alert, follows commands appropriately, not in acute distress  Cardiovascular: Regular rate and rhythm, S1/S2, no murmurs, no rubs, no gallops  Respiratory: Clear to auscultation bilaterally, no wheezing, no crackles, no rhonchi  Abdomen: Soft, non tender, less distended, bowel sounds present, no guarding, NGT in place, bilious drain   Extremities: No edema, pulses DP and PT palpable bilaterally  Neuro: Grossly nonfocal  Data Reviewed:  Basic Metabolic Panel:  Recent Labs Lab 08/20/14 0510 08/21/14 2017 08/22/14 0405 08/23/14 0020 08/24/14 0855  NA 136* 132* 129* 134* 140  K 4.0 3.7 3.8 3.2* 3.7  CL 98 89* 91* 95* 100  CO2 GLUCOSE 194* 197* 157* 135* 104*  BUN CREATININE 0.87 0.97 0.78 1.20 1.09  CALCIUM 9.1 10.1 9.1 8.6 8.7  MG  --  1.9  --   --   --   PHOS  --  3.1  --   --   --    Liver Function Tests:  Recent Labs Lab 08/19/14 2208 08/20/14 0510 08/21/14 2017 08/24/14 0855  AST 27 21 35 31  ALT ALKPHOS 51 42 45 38*  BILITOT 0.4 0.4 0.7 0.6  PROT 7.9 6.5 7.5  5.8*  ALBUMIN 4.8 3.7 4.3 3.1*    Recent Labs Lab 08/19/14 2208 08/20/14 0510 08/21/14 2017  LIPASE No results found for this basename: AMMONIA,  in the last 168 hours CBC:  Recent Labs Lab 08/19/14 2208 08/20/14 0510 08/21/14 2017 08/22/14 0405 08/23/14 0020  WBC 7.0 7.6 10.3 8.2 7.3  NEUTROABS 5.5 6.5  --   --   --   HGB 15.0 14.0 14.6 14.0 12.4*  HCT 45.6 43.2 43.2 41.7 36.3*  MCV 80.9 82.0 80.1 79.0 79.3  PLT 222 175 195 193 177   Cardiac Enzymes: No results found for this basename: CKTOTAL, CKMB, CKMBINDEX, TROPONINI,  in the last 168 hours BNP: No components found with this basename: POCBNP,  CBG:  Recent Labs Lab 08/20/14 0738 08/20/14 1145  GLUCAP 189* 158*    Recent Results (from the past 240 hour(s))  URINE CULTURE     Status: None   Collection Time    08/22/14  5:06 AM      Result Value Ref Range Status   Specimen Description URINE, CLEAN CATCH   Final   Special Requests NONE   Final   Culture  Setup Time     Final   Value: 08/22/2014 08:26     Performed at [facility] Count     Final   Value: NO GROWTH     Performed at [facility]   Culture     Final   Value: NO GROWTH     Performed at [facility]   Report Status 08/23/2014 FINAL   Final     Scheduled Meds: . ciprofloxacin  400 mg Intravenous Q12H  . diltiazem  300 mg Oral Daily  . enoxaparin (LOVENOX) injection  40 mg Subcutaneous Q24H  . latanoprost  1 drop Both Eyes QHS  . levobunolol  1 drop Both Eyes Daily  . metronidazole  500 mg Intravenous Q8H  . pantoprazole (PROTONIX) IV  40 mg Intravenous Q24H  . terazosin  4 mg Oral QHS   Continuous Infusions: . sodium chloride 75 mL/hr at 08/24/14 [phone]

## 2014-08-25 DIAGNOSIS — R109 Unspecified abdominal pain: Secondary | ICD-10-CM

## 2014-08-25 DIAGNOSIS — I10 Essential (primary) hypertension: Secondary | ICD-10-CM

## 2014-08-25 DIAGNOSIS — R52 Pain, unspecified: Secondary | ICD-10-CM

## 2014-08-25 LAB — CBC
HCT: 39.8 % (ref 39.0–52.0)
Hemoglobin: 12.9 g/dL — ABNORMAL LOW (ref 13.0–17.0)
MCH: 26.5 pg (ref 26.0–34.0)
MCHC: 32.4 g/dL (ref 30.0–36.0)
MCV: 81.9 fL (ref 78.0–100.0)
Platelets: 182 10*3/uL (ref 150–400)
RBC: 4.86 MIL/uL (ref 4.22–5.81)
RDW: 13.3 % (ref 11.5–15.5)
WBC: 5.1 10*3/uL (ref 4.0–10.5)

## 2014-08-25 LAB — BASIC METABOLIC PANEL
Anion gap: 12 (ref 5–15)
BUN: 14 mg/dL (ref 6–23)
CO2: 28 mEq/L (ref 19–32)
Calcium: 8.5 mg/dL (ref 8.4–10.5)
Chloride: 101 mEq/L (ref 96–112)
Creatinine, Ser: 1.03 mg/dL (ref 0.50–1.35)
GFR calc Af Amer: 79 mL/min — ABNORMAL LOW (ref >90)
GFR calc non Af Amer: 68 mL/min — ABNORMAL LOW (ref >90)
Glucose, Bld: 118 mg/dL — ABNORMAL HIGH (ref 70–99)
Potassium: 3.6 mEq/L — ABNORMAL LOW (ref 3.7–5.3)
Sodium: 141 mEq/L (ref 137–147)

## 2014-08-25 NOTE — Progress Notes (Signed)
Patient ID: [name] [name], male   DOB: [birth year], 79 y.o.   MRN: [id number]  [facility] PROGRESS NOTE  [name] [name] WUJ:[id number] DOB: May 03, 1935 DOA: 08/21/2014 PCP: [name], MD  Brief narrative:  Pt is 79 yo male who was discharged yesterday after being admitted for abd pain and was diagnosed with presumptive enteritis. Pt reports he felt better yesterday but as he continued to advance his diet he started to get more sick with more nausea and non bloody vomiting. Pt denies similar events in the past, no recent sick contacts or exposures. Pt reports abd pain is crampy like, intermittent and 5/10 in severity, worse with eating solids, no other specific alleviating or aggravating factors. Pt admitted from home after talking to myself.   Assessment and Plan:  Active Problems:  SBO  - NGT in place, to be clamped today per Surgery - Notes much improvement s/p large BM yesterday - appreciate surgery input  - pt remains afebrile and with no leukocytosis but has been on ABX since August 26th, 2015  - continue empiric ABX for now  - provide analgesia and antiemetics as needed  - to advance diet HTN - reasonable inpatient control  Hyponatremia  - secondary to pre renal etiology  - sodium improving: 129 --> 134 --> 140-->141 this AM  Hypokalemia  - mild, cont to correct as needed -anticipate better control once pt tolerates PO Acute blood loss anemia  - likely dilutional  - no signs of active bleeding  - repeat CBC in AM   DVT prophylaxis  Lovenox SQ while pt is in hospital  Code Status: Full  Family Communication: Pt at bedside  Disposition Plan: Home when medically stable   IV Access:   Peripheral IV Procedures and diagnostic studies:   Dg Abd Acute W/chest 08/21/2014 Fluid distention of stomach with gas distention of small bowel in the right upper quadrant suggesting proximal small bowel obstruction.  Dg Abd 1 View  08/24/2014  Stable enteric tube. Stable bowel  gas pattern since yesterday with suggestion of mild or partial residual small bowel obstruction.    Medical Consultants:   Surgery  Other Consultants:   None  Anti-Infectives:   Cipro 8/27 -->  Flagyl 8/27 -->  Jezreel Sisk, [name], MD  [facility] Pager [phone]  If 7PM-7AM, please contact night-coverage [email or web address] Password [email or web address] 08/25/2014, 12:30 PM   LOS: 4 days   HPI/Subjective: No events overnight. Pt feels much better today s/p large BM.  Objective: Filed Vitals:   08/24/14 1425 08/24/14 2004 08/25/14 0503 08/25/14 1019  BP: 159/78 159/66 118/66 146/76  Pulse: 60 50 63 76  Temp: 97.9 F (36.6 C) 98.3 F (36.8 C) 98.3 F (36.8 C)   TempSrc: Oral Oral Oral   Resp: Height:      Weight:      SpO2: 99% 100% 99% 96%    Intake/Output Summary (Last 24 hours) at 08/25/14 1230 Last data filed at 08/25/14 8657  Gross per 24 hour  Intake 2648.75 ml  Output    595 ml  Net 2053.75 ml    Exam:   General:  Pt is alert, follows commands appropriately, not in acute distress  Cardiovascular: Regular rate and rhythm, S1/S2, no murmurs, no rubs, no gallops  Respiratory: Clear to auscultation bilaterally, no wheezing, no crackles, no rhonchi  Abdomen: Soft, non tender, less distended, bowel sounds present, no guarding, NGT in place,   Extremities: No edema, pulses DP and PT  palpable bilaterally  Neuro: Grossly nonfocal  Data Reviewed: Basic Metabolic Panel:  Recent Labs Lab 08/20/14 0510 08/21/14 2017 08/22/14 0405 08/23/14 0020 08/24/14 0855 08/25/14 0005  NA 136* 132* 129* 134* 140 141  K 4.0 3.7 3.8 3.2* 3.7 3.6*  CL 98 89* 91* 95* 100 101  CO2 GLUCOSE 194* 197* 157* 135* 104* 118*  BUN CREATININE 0.87 0.97 0.78 1.20 1.09 1.03  CALCIUM 9.1 10.1 9.1 8.6 8.7 8.5  MG  --  1.9  --   --   --   --   PHOS  --  3.1  --   --   --   --    Liver Function Tests:  Recent Labs Lab 08/19/14 2208 08/20/14 0510  08/21/14 2017 08/24/14 0855  AST 27 21 35 31  ALT ALKPHOS 51 42 45 38*  BILITOT 0.4 0.4 0.7 0.6  PROT 7.9 6.5 7.5 5.8*  ALBUMIN 4.8 3.7 4.3 3.1*    Recent Labs Lab 08/19/14 2208 08/20/14 0510 08/21/14 2017  LIPASE No results found for this basename: AMMONIA,  in the last 168 hours CBC:  Recent Labs Lab 08/19/14 2208 08/20/14 0510 08/21/14 2017 08/22/14 0405 08/23/14 0020 08/25/14 0005  WBC 7.0 7.6 10.3 8.2 7.3 5.1  NEUTROABS 5.5 6.5  --   --   --   --   HGB 15.0 14.0 14.6 14.0 12.4* 12.9*  HCT 45.6 43.2 43.2 41.7 36.3* 39.8  MCV 80.9 82.0 80.1 79.0 79.3 81.9  PLT 222 175 195 193 177 182   Cardiac Enzymes: No results found for this basename: CKTOTAL, CKMB, CKMBINDEX, TROPONINI,  in the last 168 hours BNP: No components found with this basename: POCBNP,  CBG:  Recent Labs Lab 08/20/14 0738 08/20/14 1145  GLUCAP 189* 158*    Recent Results (from the past 240 hour(s))  URINE CULTURE     Status: None   Collection Time    08/22/14  5:06 AM      Result Value Ref Range Status   Specimen Description URINE, CLEAN CATCH   Final   Special Requests NONE   Final   Culture  Setup Time     Final   Value: 08/22/2014 08:26     Performed at [facility] Count     Final   Value: NO GROWTH     Performed at [facility]   Culture     Final   Value: NO GROWTH     Performed at [facility]   Report Status 08/23/2014 FINAL   Final     Scheduled Meds: . ciprofloxacin  400 mg Intravenous Q12H  . diltiazem  300 mg Oral Daily  . enoxaparin (LOVENOX) injection  40 mg Subcutaneous Q24H  . latanoprost  1 drop Both Eyes QHS  . levobunolol  1 drop Both Eyes Daily  . metronidazole  500 mg Intravenous Q8H  . pantoprazole (PROTONIX) IV  40 mg Intravenous Q24H  . terazosin  4 mg Oral QHS   Continuous Infusions: . sodium chloride 75 mL/hr at 08/24/14 2150

## 2014-08-25 NOTE — Progress Notes (Signed)
Subjective: Still on suction, but not much coming from it.  No nausea, he had a formed BM yesterday after enema.  No nausea/  Objective: Vital signs in last 24 hours: Temp:  [97.9 F (36.6 C)-98.3 F (36.8 C)] 98.3 F (36.8 C) (08/31 0503) Pulse Rate:  [50-63] 63 (08/31 0503) Resp:  [16] 16 (08/31 0503) BP: (118-159)/(66-78) 118/66 mmHg (08/31 0503) SpO2:  [99 %-100 %] 99 % (08/31 0503) Last BM Date: 08/24/14 (tap water enema given- lg results) +BM last 2 day 300 from the NG recorded. NPO Afebrile, VSS K+ 3.6 WBC is normal Film yesterday showed a few SB with gas, and contrast in the colon. Day 6 of Cipro and Flagyl. Intake/Output from previous day: 08/30 0701 - 08/31 0700 In: 2848.8 [I.V.:1698.8; NG/GT:450; IV Piggyback:700] Out: 1295 [Urine:995; Emesis/NG output:300] Intake/Output this shift: Total I/O In: -  Out: 100 [Urine:100]  General appearance: alert, cooperative and no distress GI: soft, non-tender; bowel sounds normal; no masses,  no organomegaly  Lab Results:   Recent Labs  08/23/14 0020 08/25/14 0005  WBC 7.3 5.1  HGB 12.4* 12.9*  HCT 36.3* 39.8  PLT 177 182    BMET  Recent Labs  08/24/14 0855 08/25/14 0005  NA 140 141  K 3.7 3.6*  CL 100 101  CO2 31 28  GLUCOSE 104* 118*  BUN 15 14  CREATININE 1.09 1.03  CALCIUM 8.7 8.5   PT/INR No results found for this basename: LABPROT, INR,  in the last 72 hours   Recent Labs Lab 08/19/14 2208 08/20/14 0510 08/21/14 2017 08/24/14 0855  AST 27 21 35 31  ALT ALKPHOS 51 42 45 38*  BILITOT 0.4 0.4 0.7 0.6  PROT 7.9 6.5 7.5 5.8*  ALBUMIN 4.8 3.7 4.3 3.1*     Lipase     Component Value Date/Time   LIPASE 26 08/21/2014 2017     Studies/Results: Dg Chest 2 View  08/23/2014   CLINICAL DATA:  Cough.  Chest pain.  EXAM: CHEST  2 VIEW  COMPARISON:  08/22/2014 .  FINDINGS: NG tube noted: Stomach. Mediastinum and hilar structures are normal. Bibasilar atelectasis. Cardiomegaly  with normal pulmonary vascularity. No pleural effusion or pneumothorax. Bowel distention is again noted. The stomach is nondistended.  IMPRESSION: 1. NG tube noted coiled in stomach. No gastric distention. Small bowel distention again noted. Abdominal series can be obtained to further evaluate. 2. Bibasilar atelectasis. 3. Cardiomegaly.  No CHF.   Electronically Signed   By: Maisie Fus  Register   On: 08/23/2014 08:44   Dg Abd 1 View  08/24/2014   CLINICAL DATA:  79 year old male with small bowel obstruction, abdominal pain. Initial encounter.  EXAM: ABDOMEN - 1 VIEW  COMPARISON:  CT Abdomen and Pelvis 08/20/2014, and subsequent abdominal radiographs.  FINDINGS: Portable AP supine view at 0837 hrs. Enteric tube remains in place. Stable bowel gas pattern since 08/23/2014, with oral contrast throughout nondilated colon, but residual mildly dilated gas-filled mid abdominal small bowel loops (up to 39 mm diameter). No definite pneumoperitoneum on this supine view. Stable visualized osseous structures.  IMPRESSION: 1. Stable enteric tube. 2. Stable bowel gas pattern since yesterday with suggestion of mild or partial residual small bowel obstruction.   Electronically Signed   By: Augusto Gamble M.D.   On: 08/24/2014 08:50   Dg Abd 2 Views  08/23/2014   CLINICAL DATA:  Abdominal distension  EXAM: ABDOMEN - 2 VIEW  COMPARISON:  08/22/2014  FINDINGS: A  nasogastric catheter is again noted within the stomach. Scattered large and small bowel gas is seen. Contrast from prior CT is now noted within the colon indicating the dilated small bowel is related to a partial small bowel obstruction the degree of small bowel dilatation is relatively stable from the prior exam. No free air is seen.  IMPRESSION: Changes consistent with a partial small bowel obstruction.   Electronically Signed   By: Alcide Clever M.D.   On: 08/23/2014 08:43    Medications: . ciprofloxacin  400 mg Intravenous Q12H  . diltiazem  300 mg Oral Daily  .  enoxaparin (LOVENOX) injection  40 mg Subcutaneous Q24H  . latanoprost  1 drop Both Eyes QHS  . levobunolol  1 drop Both Eyes Daily  . metronidazole  500 mg Intravenous Q8H  . pantoprazole (PROTONIX) IV  40 mg Intravenous Q24H  . terazosin  4 mg Oral QHS    Assessment/Plan 1. SBO/gastric distension vs enteritis with obstruction  2. Hx of prior [facility] repair  3. Hypertension  4. Hx of diverticulosis and internal hemorrhoids, no hx of diverticulitis  5. Hx of hyperglycemia  6. IBS on list but pt denies any diagnosis of this in the past.  7. ED    Plan:  Clamp NG, start some clears and see how he does. Day 6 of antibiotics.  LOS: 4 days    Eshal Propps 08/25/2014

## 2014-08-25 NOTE — Progress Notes (Signed)
General Surgery [facility]  Patient seen and examined.  Agree with taking NG off suction today and allowing clear liquid diet.  Will remove NG tomorrow if diet tolerated.  Encourage ambulation.  Velora Heckler, MD, [facility] Office: [phone]

## 2014-08-25 NOTE — Progress Notes (Signed)
Pt ambulating around the whole unit with standby assist, tolerated well. Sitting up in chair. Tolerated breakfast well. NG clamped at 1045, no complaints voiced.

## 2014-08-26 MED ORDER — PANTOPRAZOLE SODIUM 40 MG PO TBEC
40.0000 mg | DELAYED_RELEASE_TABLET | Freq: Every day | ORAL | Status: DC
  Administered 2014-08-26 – 2014-08-28 (×3): 40 mg via ORAL
  Filled 2014-08-26 (×3): qty 1

## 2014-08-26 MED ORDER — METRONIDAZOLE 500 MG PO TABS
500.0000 mg | ORAL_TABLET | Freq: Three times a day (TID) | ORAL | Status: DC
  Administered 2014-08-26 – 2014-08-28 (×7): 500 mg via ORAL
  Filled 2014-08-26 (×9): qty 1

## 2014-08-26 MED ORDER — CIPROFLOXACIN HCL 250 MG PO TABS
250.0000 mg | ORAL_TABLET | Freq: Two times a day (BID) | ORAL | Status: DC
  Administered 2014-08-26 – 2014-08-28 (×5): 250 mg via ORAL
  Filled 2014-08-26 (×7): qty 1

## 2014-08-26 MED ORDER — POTASSIUM CHLORIDE CRYS ER 20 MEQ PO TBCR
40.0000 meq | EXTENDED_RELEASE_TABLET | Freq: Once | ORAL | Status: AC
  Administered 2014-08-26: 40 meq via ORAL
  Filled 2014-08-26: qty 2

## 2014-08-26 NOTE — Care Management Note (Deleted)
CARE MANAGEMENT NOTE 08/26/2014  Patient:  [name] [name], [name] [name]   Account Number:  [id number]  Date Initiated:  08/22/2014  Documentation initiated by:  Berenda Morale  Subjective/Objective Assessment:   79 yo admitted with abd pain and SBO     Action/Plan:   From home with wife.   Anticipated DC Date:  08/27/2014   Anticipated DC Plan:  HOME/SELF CARE      DC Planning Services  CM consult      Choice offered to / List presented to:             Status of service:  In process, will continue to follow Medicare Important Message given?   (If response is "NO", the following Medicare IM given date fields will be blank) Date Medicare IM given:   Medicare IM given by:   Date Additional Medicare IM given:   Additional Medicare IM given by:    Discharge Disposition:    Per UR Regulation:  Reviewed for med. necessity/level of care/duration of stay  If discussed at Long Length of Stay Meetings, dates discussed:   08/26/2014    Comments:  08/26/14 Sandford Craze RN,BSN,NCM Pt diet to full liq todag, NG pulled.  CM to follow.  08/22/14 Sandford Craze RN,BSN,NCM Pt with SBO, NGT and IVF.  Will continue to follow for DC needs.

## 2014-08-26 NOTE — Progress Notes (Signed)
Patient without complaints of nausea. + gas but no further stools. Up as tol in room. NGT still clamped.

## 2014-08-26 NOTE — Plan of Care (Signed)
Problem: Phase II Progression Outcomes Goal: Progress activity as tolerated unless otherwise ordered Outcome: Completed/Met Date Met:  08/26/14 Up as tol in room

## 2014-08-26 NOTE — Progress Notes (Signed)
  Subjective: He looks and feels much better.  Tolerating clears and ready for more.  NO BM, but lots of gas yesterday. Objective: Vital signs in last 24 hours: Temp:  [98 F (36.7 C)-98.7 F (37.1 C)] 98 F (36.7 C) (09/01 0643) Pulse Rate:  [55-76] 55 (09/01 0643) Resp:  [16-18] 16 (09/01 0643) BP: (115-146)/(60-79) 115/62 mmHg (09/01 0643) SpO2:  [96 %-100 %] 96 % (09/01 0643) Last BM Date: 08/24/14 1720 PO recorded NO BM yesterday recorded Aferbrile, VSS Labs are fine. Intake/Output from previous day: 08/31 0701 - 09/01 0700 In: 4336.3 [P.O.:1720; I.V.:1916.3; IV Piggyback:700] Out: 1435 [Urine:1335; Emesis/NG output:100] Intake/Output this shift:    General appearance: alert, cooperative and no distress GI: soft, non-tender; bowel sounds normal; no masses,  no organomegaly  Lab Results:   Recent Labs  08/25/14 0005  WBC 5.1  HGB 12.9*  HCT 39.8  PLT 182    BMET  Recent Labs  08/24/14 0855 08/25/14 0005  NA 140 141  K 3.7 3.6*  CL 100 101  CO2 31 28  GLUCOSE 104* 118*  BUN 15 14  CREATININE 1.09 1.03  CALCIUM 8.7 8.5   PT/INR No results found for this basename: LABPROT, INR,  in the last 72 hours   Recent Labs Lab 08/19/14 2208 08/20/14 0510 08/21/14 2017 08/24/14 0855  AST 27 21 35 31  ALT ALKPHOS 51 42 45 38*  BILITOT 0.4 0.4 0.7 0.6  PROT 7.9 6.5 7.5 5.8*  ALBUMIN 4.8 3.7 4.3 3.1*     Lipase     Component Value Date/Time   LIPASE 26 08/21/2014 2017     Studies/Results: Dg Abd 1 View  08/24/2014   CLINICAL DATA:  79 year old male with small bowel obstruction, abdominal pain. Initial encounter.  EXAM: ABDOMEN - 1 VIEW  COMPARISON:  CT Abdomen and Pelvis 08/20/2014, and subsequent abdominal radiographs.  FINDINGS: Portable AP supine view at 0837 hrs. Enteric tube remains in place. Stable bowel gas pattern since 08/23/2014, with oral contrast throughout nondilated colon, but residual mildly dilated gas-filled mid  abdominal small bowel loops (up to 39 mm diameter). No definite pneumoperitoneum on this supine view. Stable visualized osseous structures.  IMPRESSION: 1. Stable enteric tube. 2. Stable bowel gas pattern since yesterday with suggestion of mild or partial residual small bowel obstruction.   Electronically Signed   By: Augusto Gamble M.D.   On: 08/24/2014 08:50    Medications: . ciprofloxacin  400 mg Intravenous Q12H  . diltiazem  300 mg Oral Daily  . enoxaparin (LOVENOX) injection  40 mg Subcutaneous Q24H  . latanoprost  1 drop Both Eyes QHS  . levobunolol  1 drop Both Eyes Daily  . metronidazole  500 mg Intravenous Q8H  . pantoprazole (PROTONIX) IV  40 mg Intravenous Q24H  . terazosin  4 mg Oral QHS    Assessment/Plan 1. SBO/gastric distension vs enteritis with obstruction  2. Hx of prior [facility] repair  3. Hypertension  4. Hx of diverticulosis and internal hemorrhoids, no hx of diverticulitis  5. Hx of hyperglycemia  6. IBS on list but pt denies any diagnosis of this in the past.  7. ED    PLan:  Plan:  Full liquids and dc NG.  LOS: 5 days    Tyleigh Mahn 08/26/2014

## 2014-08-26 NOTE — Progress Notes (Signed)
Patient ID: [name] [name], male   DOB: June 12, 1935, 79 y.o.   MRN: [id number]  [facility] PROGRESS NOTE  [name] [name]:[id number] DOB: 20-Nov-1935 DOA: 08/21/2014 PCP: [name] Salisbury, MD  Brief narrative:  Pt is 79 yo male who was discharged yesterday after being admitted for abd pain and was diagnosed with presumptive enteritis. Pt reports he felt better yesterday but as he continued to advance his diet he started to get more sick with more nausea and non bloody vomiting. Pt denies similar events in the past, no recent sick contacts or exposures. Pt reports abd pain is crampy like, intermittent and 5/10 in severity, worse with eating solids, no other specific alleviating or aggravating factors. Pt admitted from home after talking to myself.   Assessment and Plan:  Active Problems:  SBO  - NGT removed and pt reports feeling better, tolerated breakfast well   - appreciate surgery input  - pt remains afebrile and with no leukocytosis but has been on ABX since August 26th, 2015  - continue empiric ABX  - provide analgesia and antiemetics as needed  - advance diet to full liquid  HTN - reasonable inpatient control  Hyponatremia  - secondary to pre renal etiology  - sodium improving: 129 --> 134 --> 140-->141 this AM  Hypokalemia  - mild, cont to correct as needed  -anticipate better control once pt tolerates PO  Acute blood loss anemia  - likely dilutional  - no signs of active bleeding  - repeat CBC in AM   DVT prophylaxis  Lovenox SQ while pt is in hospital  Code Status: Full  Family Communication: Pt at bedside  Disposition Plan: Home when medically stable   IV Access:   Peripheral IV Procedures and diagnostic studies:   Dg Abd Acute W/chest 08/21/2014 Fluid distention of stomach with gas distention of small bowel in the right upper quadrant suggesting proximal small bowel obstruction.  Dg Abd 1 View 08/24/2014 Stable enteric tube. Stable bowel gas pattern since  yesterday with suggestion of mild or partial residual small bowel obstruction.  Medical Consultants:   Surgery  Other Consultants:   None  Anti-Infectives:   Cipro 8/27 -->  Flagyl 8/27 -->  Debbora Presto, MD  [facility] Pager [phone]  If 7PM-7AM, please contact night-coverage [email or web address] Password [email or web address] 08/26/2014, 3:30 PM   LOS: 5 days   HPI/Subjective: No events overnight.   Objective: Filed Vitals:   08/25/14 2045 08/25/14 2244 08/26/14 0643 08/26/14 1431  BP:  130/79 115/62 118/63  Pulse: 62 56 55 54  Temp:  98.3 F (36.8 C) 98 F (36.7 C) 98.5 F (36.9 C)  TempSrc:  Oral Oral Oral  Resp:  Height:      Weight:      SpO2:  100% 96% 100%    Intake/Output Summary (Last 24 hours) at 08/26/14 1530 Last data filed at 08/26/14 1443  Gross per 24 hour  Intake 3916.25 ml  Output   1435 ml  Net 2481.25 ml    Exam:   General:  Pt is alert, follows commands appropriately, not in acute distress  Cardiovascular: Regular rate and rhythm, S1/S2, no murmurs, no rubs, no gallops  Respiratory: Clear to auscultation bilaterally, no wheezing, no crackles, no rhonchi  Abdomen: Soft, non tender, less distended, bowel sounds present, no guarding  Extremities: No edema, pulses DP and PT palpable bilaterally  Neuro: Grossly nonfocal  Data Reviewed: Basic Metabolic Panel:  Recent Labs Lab 08/20/14 0510 08/21/14  2017 08/22/14 0405 08/23/14 0020 08/24/14 0855 08/25/14 0005  NA 136* 132* 129* 134* 140 141  K 4.0 3.7 3.8 3.2* 3.7 3.6*  CL 98 89* 91* 95* 100 101  CO2 GLUCOSE 194* 197* 157* 135* 104* 118*  BUN CREATININE 0.87 0.97 0.78 1.20 1.09 1.03  CALCIUM 9.1 10.1 9.1 8.6 8.7 8.5  MG  --  1.9  --   --   --   --   PHOS  --  3.1  --   --   --   --    Liver Function Tests:  Recent Labs Lab 08/19/14 2208 08/20/14 0510 08/21/14 2017 08/24/14 0855  AST 27 21 35 31  ALT ALKPHOS 51 42 45 38*   BILITOT 0.4 0.4 0.7 0.6  PROT 7.9 6.5 7.5 5.8*  ALBUMIN 4.8 3.7 4.3 3.1*    Recent Labs Lab 08/19/14 2208 08/20/14 0510 08/21/14 2017  LIPASE No results found for this basename: AMMONIA,  in the last 168 hours CBC:  Recent Labs Lab 08/19/14 2208 08/20/14 0510 08/21/14 2017 08/22/14 0405 08/23/14 0020 08/25/14 0005  WBC 7.0 7.6 10.3 8.2 7.3 5.1  NEUTROABS 5.5 6.5  --   --   --   --   HGB 15.0 14.0 14.6 14.0 12.4* 12.9*  HCT 45.6 43.2 43.2 41.7 36.3* 39.8  MCV 80.9 82.0 80.1 79.0 79.3 81.9  PLT 222 175 195 193 177 182   CBG:  Recent Labs Lab 08/20/14 0738 08/20/14 1145  GLUCAP 189* 158*    Recent Results (from the past 240 hour(s))  URINE CULTURE     Status: None   Collection Time    08/22/14  5:06 AM      Result Value Ref Range Status   Specimen Description URINE, CLEAN CATCH   Final   Special Requests NONE   Final   Culture  Setup Time     Final   Value: 08/22/2014 08:26     Performed at [facility] Count     Final   Value: NO GROWTH     Performed at [facility]   Culture     Final   Value: NO GROWTH     Performed at [facility]   Report Status 08/23/2014 FINAL   Final     Scheduled Meds: . ciprofloxacin  250 mg Oral BID  . diltiazem  300 mg Oral Daily  . enoxaparin (LOVENOX) injection  40 mg Subcutaneous Q24H  . latanoprost  1 drop Both Eyes QHS  . levobunolol  1 drop Both Eyes Daily  . metroNIDAZOLE  500 mg Oral 3 times per day  . pantoprazole  40 mg Oral Daily  . terazosin  4 mg Oral QHS   Continuous Infusions:

## 2014-08-26 NOTE — Progress Notes (Signed)
Pt sitting up on side of bed when I arrived. Pt said he was feeling much btr. He jokingly said don't ever get a blockage. Pt said they were able to get it open w/o surgery. Just waiting on bm and he can go home. Dr arrived during visit. Pt asked for prayer. Marjory Lies Chaplain  08/26/14 0900  Clinical Encounter Type  Visited With Patient

## 2014-08-26 NOTE — Care Management Note (Signed)
CARE MANAGEMENT NOTE 08/26/2014  Patient:  [name] [name]   Account Number:  [id number]  Date Initiated:  08/22/2014  Documentation initiated by:  Berenda Morale  Subjective/Objective Assessment:   79 yo admitted with abd pain and SBO     Action/Plan:   From home with wife.   Anticipated DC Date:  08/27/2014   Anticipated DC Plan:  HOME/SELF CARE      DC Planning Services  CM consult      Choice offered to / List presented to:             Status of service:  In process, will continue to follow Medicare Important Message given?  YES (If response is "NO", the following Medicare IM given date fields will be blank) Date Medicare IM given:  08/26/2014 Medicare IM given by:  Berenda Morale Date Additional Medicare IM given:   Additional Medicare IM given by:    Discharge Disposition:    Per UR Regulation:  Reviewed for med. necessity/level of care/duration of stay  If discussed at Long Length of Stay Meetings, dates discussed:   08/26/2014    Comments:  08/26/14 Sandford Craze RN,BSN,NCM Pt diet to full liq todag, NG pulled.  CM to follow.  08/22/14 Sandford Craze RN,BSN,NCM Pt with SBO, NGT and IVF.  Will continue to follow for DC needs.

## 2014-08-26 NOTE — Plan of Care (Signed)
Problem: Phase I Progression Outcomes Goal: Pain controlled with appropriate interventions Outcome: Completed/Met Date Met:  08/26/14 Denies pain

## 2014-08-26 NOTE — Progress Notes (Signed)
General Surgery [facility]  Patient seen and examined.  I removed his NG tube.  Full liquid diet.  Encouraged ambulation.  Velora Heckler, MD, [facility] Office: [phone]

## 2014-08-26 NOTE — Plan of Care (Signed)
Problem: Phase I Progression Outcomes Goal: Other Phase I Outcomes/Goals Outcome: Completed/Met Date Met:  08/26/14 Denies nausea and eating 100% of trays of clear liquids. NGT clamped.

## 2014-08-27 LAB — BASIC METABOLIC PANEL
Anion gap: 9 (ref 5–15)
BUN: 5 mg/dL — ABNORMAL LOW (ref 6–23)
CO2: 26 mEq/L (ref 19–32)
Calcium: 8 mg/dL — ABNORMAL LOW (ref 8.4–10.5)
Chloride: 105 mEq/L (ref 96–112)
Creatinine, Ser: 0.95 mg/dL (ref 0.50–1.35)
GFR calc Af Amer: >90 mL/min (ref >90)
GFR calc non Af Amer: 79 mL/min — ABNORMAL LOW (ref >90)
Glucose, Bld: 146 mg/dL — ABNORMAL HIGH (ref 70–99)
Potassium: 4 mEq/L (ref 3.7–5.3)
Sodium: 140 mEq/L (ref 137–147)

## 2014-08-27 LAB — CBC
HCT: 37.1 % — ABNORMAL LOW (ref 39.0–52.0)
Hemoglobin: 12.2 g/dL — ABNORMAL LOW (ref 13.0–17.0)
MCH: 27 pg (ref 26.0–34.0)
MCHC: 32.9 g/dL (ref 30.0–36.0)
MCV: 82.1 fL (ref 78.0–100.0)
Platelets: 191 10*3/uL (ref 150–400)
RBC: 4.52 MIL/uL (ref 4.22–5.81)
RDW: 13.4 % (ref 11.5–15.5)
WBC: 4.8 10*3/uL (ref 4.0–10.5)

## 2014-08-27 MED ORDER — HYDROCODONE-ACETAMINOPHEN 5-300 MG PO TABS: 1.0000 | ORAL_TABLET | Freq: Four times a day (QID) | ORAL | Status: DC | PRN

## 2014-08-27 NOTE — Progress Notes (Signed)
Patient ID: [name] [name], male   DOB: Jan 08, 1935, 79 y.o.   MRN: [id number]  [facility] PROGRESS NOTE  [name] [name]:[id number] DOB: 07/27/1935 DOA: 08/21/2014 PCP: [name], MD  Brief narrative:  Pt is 79 yo male who was discharged yesterday after being admitted for abd pain and was diagnosed with presumptive enteritis. Pt reports he felt better yesterday but as he continued to advance his diet he started to get more sick with more nausea and non bloody vomiting. Pt denies similar events in the past, no recent sick contacts or exposures. Pt reports abd pain is crampy like, intermittent and 5/10 in severity, worse with eating solids, no other specific alleviating or aggravating factors. Pt admitted from home after talking to myself.   Assessment and Plan:  Active Problems:  SBO  - NGT removed and pt reports feeling better, tolerated breakfast well  - appreciate surgery input  - pt remains afebrile and with no leukocytosis but has been on ABX since August 26th, 2015  - continue empiric ABX  - provide analgesia and antiemetics as needed  - advance diet to soft and possible d/c in AM HTN - reasonable inpatient control  Hyponatremia  - secondary to pre renal etiology  - sodium improving: 129 --> 134 --> 140-->141 --> 140 this AM  Hypokalemia  - supplemented and WNL this AM Acute blood loss anemia  - likely dilutional  - no signs of active bleeding  - repeat CBC in AM   DVT prophylaxis  Lovenox SQ while pt is in hospital  Code Status: Full  Family Communication: Pt at bedside  Disposition Plan: Home when medically stable   IV Access:   Peripheral IV Procedures and diagnostic studies:   Dg Abd Acute W/chest 08/21/2014 Fluid distention of stomach with gas distention of small bowel in the right upper quadrant suggesting proximal small bowel obstruction.  Dg Abd 1 View 08/24/2014 Stable enteric tube. Stable bowel gas pattern since yesterday with suggestion of mild  or partial residual small bowel obstruction.  Medical Consultants:   Surgery  Other Consultants:   None  Anti-Infectives:   Cipro 8/27 -->  Flagyl 8/27 -->  Debbora Presto, MD  [facility] Pager [phone]  If 7PM-7AM, please contact night-coverage [email or web address] Password [email or web address] 08/27/2014, 4:00 PM   LOS: 6 days   HPI/Subjective: No events overnight.   Objective: Filed Vitals:   08/26/14 1431 08/26/14 2101 08/27/14 0608 08/27/14 1447  BP: 118/63 134/59 129/72 123/61  Pulse: 54 53 58 67  Temp: 98.5 F (36.9 C) 98.7 F (37.1 C) 98.7 F (37.1 C) 98.4 F (36.9 C)  TempSrc: Oral Oral Oral Oral  Resp: Height:      Weight:      SpO2: 100% 100% 98% 100%    Intake/Output Summary (Last 24 hours) at 08/27/14 1600 Last data filed at 08/27/14 0610  Gross per 24 hour  Intake    600 ml  Output    625 ml  Net    -25 ml    Exam:   General:  Pt is alert, follows commands appropriately, not in acute distress  Cardiovascular: Regular rate and rhythm, S1/S2, no murmurs, no rubs, no gallops  Respiratory: Clear to auscultation bilaterally, no wheezing, no crackles, no rhonchi  Abdomen: Soft, non tender, slightly distended, bowel sounds present, no guarding  Extremities: No edema, pulses DP and PT palpable bilaterally  Neuro: Grossly nonfocal  Data Reviewed: Basic Metabolic Panel:  Recent Labs  Lab 08/21/14 2017 08/22/14 0405 08/23/14 0020 08/24/14 0855 08/25/14 0005 08/27/14 0009  NA 132* 129* 134* 140 141 140  K 3.7 3.8 3.2* 3.7 3.6* 4.0  CL 89* 91* 95* 100 101 105  CO2 GLUCOSE 197* 157* 135* 104* 118* 146*  BUN 5*  CREATININE 0.97 0.78 1.20 1.09 1.03 0.95  CALCIUM 10.1 9.1 8.6 8.7 8.5 8.0*  MG 1.9  --   --   --   --   --   PHOS 3.1  --   --   --   --   --    Liver Function Tests:  Recent Labs Lab 08/21/14 2017 08/24/14 0855  AST 35 31  ALT 17 24  ALKPHOS 45 38*  BILITOT 0.7 0.6  PROT 7.5 5.8*  ALBUMIN 4.3  3.1*    Recent Labs Lab 08/21/14 2017  LIPASE 26   CBC:  Recent Labs Lab 08/21/14 2017 08/22/14 0405 08/23/14 0020 08/25/14 0005 08/27/14 0009  WBC 10.3 8.2 7.3 5.1 4.8  HGB 14.6 14.0 12.4* 12.9* 12.2*  HCT 43.2 41.7 36.3* 39.8 37.1*  MCV 80.1 79.0 79.3 81.9 82.1  PLT 195 193 177 182 191   Recent Results (from the past 240 hour(s))  URINE CULTURE     Status: None   Collection Time    08/22/14  5:06 AM      Result Value Ref Range Status   Specimen Description URINE, CLEAN CATCH   Final   Special Requests NONE   Final   Culture  Setup Time     Final   Value: 08/22/2014 08:26     Performed at [facility] Count     Final   Value: NO GROWTH     Performed at [facility]   Culture     Final   Value: NO GROWTH     Performed at [facility]   Report Status 08/23/2014 FINAL   Final    Scheduled Meds: . ciprofloxacin  250 mg Oral BID  . diltiazem  300 mg Oral Daily  . enoxaparin (LOVENOX) injection  40 mg Subcutaneous Q24H  . latanoprost  1 drop Both Eyes QHS  . levobunolol  1 drop Both Eyes Daily  . metroNIDAZOLE  500 mg Oral 3 times per day  . pantoprazole  40 mg Oral Daily  . terazosin  4 mg Oral QHS   Continuous Infusions:

## 2014-08-27 NOTE — Progress Notes (Signed)
  Subjective: He says his stomach still feels a little woozy, but tolerating full liquids without nausea, + BM. His labs are normal, no sign of enteritis or obstruction.  Objective: Vital signs in last 24 hours: Temp:  [98.5 F (36.9 C)-98.7 F (37.1 C)] 98.7 F (37.1 C) (09/02 0608) Pulse Rate:  [53-58] 58 (09/02 0608) Resp:  [16-18] 16 (09/02 0608) BP: (118-134)/(59-72) 129/72 mmHg (09/02 0608) SpO2:  [98 %-100 %] 98 % (09/02 0608) Last BM Date: 08/26/14 1080 PO +BM Afebrile, VSS Labs OK on full liquids. Intake/Output from previous day: 09/01 0701 - 09/02 0700 In: 1080 [P.O.:1080] Out: 825 [Urine:825] Intake/Output this shift:    General appearance: alert, cooperative and no distress Resp: clear to auscultation bilaterally GI: soft, non-tender; bowel sounds normal; no masses,  no organomegaly  Lab Results:   Recent Labs  08/25/14 0005 08/27/14 0009  WBC 5.1 4.8  HGB 12.9* 12.2*  HCT 39.8 37.1*  PLT 182 191    BMET  Recent Labs  08/25/14 0005 08/27/14 0009  NA 141 140  K 3.6* 4.0  CL 101 105  CO2 28 26  GLUCOSE 118* 146*  BUN 14 5*  CREATININE 1.03 0.95  CALCIUM 8.5 8.0*   PT/INR No results found for this basename: LABPROT, INR,  in the last 72 hours   Recent Labs Lab 08/21/14 2017 08/24/14 0855  AST 35 31  ALT 17 24  ALKPHOS 45 38*  BILITOT 0.7 0.6  PROT 7.5 5.8*  ALBUMIN 4.3 3.1*     Lipase     Component Value Date/Time   LIPASE 26 08/21/2014 2017     Studies/Results: No results found.  Medications: . ciprofloxacin  250 mg Oral BID  . diltiazem  300 mg Oral Daily  . enoxaparin (LOVENOX) injection  40 mg Subcutaneous Q24H  . latanoprost  1 drop Both Eyes QHS  . levobunolol  1 drop Both Eyes Daily  . metroNIDAZOLE  500 mg Oral 3 times per day  . pantoprazole  40 mg Oral Daily  . terazosin  4 mg Oral QHS    Assessment/Plan 1. SBO/gastric distension vs enteritis with obstruction  2. Hx of prior [facility] repair  3. Hypertension   4. Hx of diverticulosis and internal hemorrhoids, no hx of diverticulitis  5. Hx of hyperglycemia  6. IBS on list but pt denies any diagnosis of this in the past.  7. ED    Plan;  Soft diet, He is still on Cipro and Flagyl.  + BM.  If he tolerates soft diet and has another BM he is clear from our standpoint.   LOS: 6 days    Sani Madariaga 08/27/2014

## 2014-08-27 NOTE — Progress Notes (Signed)
General Surgery [facility]  Patient seen and examined.  Tolerated regular lunch.  No BM today.  No nausea or emesis.  No pain.  Encouraged ambulation.  Hopefully home tomorrow.  [name] Heckler, MD, [facility] Office: [phone]

## 2014-08-28 LAB — BASIC METABOLIC PANEL
Anion gap: 9 (ref 5–15)
BUN: 6 mg/dL (ref 6–23)
CO2: 25 mEq/L (ref 19–32)
Calcium: 7.9 mg/dL — ABNORMAL LOW (ref 8.4–10.5)
Chloride: 105 mEq/L (ref 96–112)
Creatinine, Ser: 0.96 mg/dL (ref 0.50–1.35)
GFR calc Af Amer: >90 mL/min (ref >90)
GFR calc non Af Amer: 79 mL/min — ABNORMAL LOW (ref >90)
Glucose, Bld: 120 mg/dL — ABNORMAL HIGH (ref 70–99)
Potassium: 3.9 mEq/L (ref 3.7–5.3)
Sodium: 139 mEq/L (ref 137–147)

## 2014-08-28 LAB — CBC
HCT: 36.4 % — ABNORMAL LOW (ref 39.0–52.0)
Hemoglobin: 11.7 g/dL — ABNORMAL LOW (ref 13.0–17.0)
MCH: 26.3 pg (ref 26.0–34.0)
MCHC: 32.1 g/dL (ref 30.0–36.0)
MCV: 81.8 fL (ref 78.0–100.0)
Platelets: 218 10*3/uL (ref 150–400)
RBC: 4.45 MIL/uL (ref 4.22–5.81)
RDW: 13.6 % (ref 11.5–15.5)
WBC: 4.4 10*3/uL (ref 4.0–10.5)

## 2014-08-28 LAB — CLOSTRIDIUM DIFFICILE BY PCR: Toxigenic C. Difficile by PCR: NEGATIVE

## 2014-08-28 MED ORDER — SACCHAROMYCES BOULARDII 250 MG PO CAPS
250.0000 mg | ORAL_CAPSULE | Freq: Two times a day (BID) | ORAL | Status: DC
  Administered 2014-08-28: 250 mg via ORAL
  Filled 2014-08-28 (×2): qty 1

## 2014-08-28 MED ORDER — METRONIDAZOLE 500 MG PO TABS: 500.0000 mg | ORAL_TABLET | Freq: Three times a day (TID) | ORAL | Status: DC

## 2014-08-28 MED ORDER — SACCHAROMYCES BOULARDII 250 MG PO CAPS: 250.0000 mg | ORAL_CAPSULE | Freq: Two times a day (BID) | ORAL | Status: DC

## 2014-08-28 MED ORDER — CIPROFLOXACIN HCL 500 MG PO TABS: 500.0000 mg | ORAL_TABLET | Freq: Two times a day (BID) | ORAL | Status: DC

## 2014-08-28 NOTE — Discharge Instructions (Signed)
Small Bowel Obstruction °A small bowel obstruction is a blockage (obstruction) of the small intestine (small bowel). The small bowel is a long, slender tube that connects the stomach to the colon. Its job is to absorb nutrients from the fluids and foods you consume into the bloodstream.  °CAUSES  °There are many causes of intestinal blockage. The most common ones include: °· Hernias. This is a more common cause in children than adults. °· Inflammatory bowel disease (enteritis and colitis). °· Twisting of the bowel (volvulus). °· Tumors. °· Scar tissue (adhesions) from previous surgery or radiation treatment. °· Recent surgery. This may cause an acute small bowel obstruction called an ileus. °SYMPTOMS  °· Abdominal pain. This may be dull cramps or sharp pain. It may occur in one area or may be present in the entire abdomen. Pain can range from mild to severe, depending on the degree of obstruction. °· Nausea and vomiting. Vomit may be greenish or yellow bile color. °· Distended or swollen stomach. Abdominal bloating is a common symptom. °· Constipation. °· Lack of passing gas. °· Frequent belching. °· Diarrhea. This may occur if runny stool is able to leak around the obstruction. °DIAGNOSIS  °Your caregiver can usually diagnose small bowel obstruction by taking a history, doing a physical exam, and taking X-rays. If the cause is unclear, a CT scan (computerized tomography) of your abdomen and pelvis may be needed. °TREATMENT  °Treatment of the blockage depends on the cause and how bad the problem is.  °· Sometimes, the obstruction improves with bed rest and intravenous (IV) fluids. °· Resting the bowel is very important. This means following a simple diet. Sometimes, a clear liquid diet may be required for several days. °· Sometimes, a small tube (nasogastric tube) is placed into the stomach to decompress the bowel. When the bowel is blocked, it usually swells up like a balloon filled with air and fluids.  Decompression means that the air and fluids are removed by suction through that tube. This can help with pain, discomfort, and nausea. It can also help the obstruction resolve faster. °· Surgery may be required if other treatments do not work. Bowel obstruction from a hernia may require early surgery and can be an emergency procedure. Adhesions that cause frequent or severe obstructions may also require surgery. °HOME CARE INSTRUCTIONS °If your bowel obstruction is only partial or incomplete, you may be allowed to go home. °· Get plenty of rest. °· Follow your diet as directed by your caregiver. °· Only consume clear liquids until your condition improves. °· Avoid solid foods as instructed. °SEEK IMMEDIATE MEDICAL CARE IF: °· You have increased pain or cramping. °· You vomit blood. °· You have uncontrolled vomiting or nausea. °· You cannot drink fluids due to vomiting or pain. °· You develop confusion. °· You begin feeling very dry or thirsty (dehydrated). °· You have severe bloating. °· You have chills. °· You have a fever. °· You feel extremely weak or you faint. °MAKE SURE YOU: °· Understand these instructions. °· Will watch your condition. °· Will get help right away if you are not doing well or get worse. °Document Released: 02/28/2006 Document Revised: 03/05/2012 Document Reviewed: 02/25/2011 °ExitCare® Patient Information ©2015 [employer]. This information is not intended to replace advice given to you by your health care provider. Make sure you discuss any questions you have with your health care provider. ° °

## 2014-08-28 NOTE — Progress Notes (Signed)
Came to visit patient at bedside to offer [facility] Care Management services. Patient reports he manages well at home and has a wife that assists him as well. States his readmission could not have been prevented. Pleasantly declines [facility] Care Management services at this time. Accepted brochure with contact information in case he changes his mind in the future. Will make inpatient RNCM aware of bedside visit. Raiford Noble, MSN-RN,BSN- [facility] [phone]

## 2014-08-28 NOTE — Progress Notes (Signed)
Pt was sitting up in chair when I arrived. Pt said he would get to go home today. Pt relieved his problem was found and treated. Pt was grateful prayer visit and asked for continued prayer. Pt appreciative of hospital staff care. Marjory Lies Chaplain  08/28/14 0900  Clinical Encounter Type  Visited With Patient

## 2014-08-28 NOTE — Discharge Summary (Signed)
Physician Discharge Summary  [name] [name]:[id number] DOB: Jul 12, 1935 DOA: 08/21/2014  PCP: [name] Salisbury, MD  Admit date: 08/21/2014 Discharge date: 08/28/2014  Recommendations for Outpatient Follow-up:  1. Pt will need to follow up with PCP in 2-3 weeks post discharge 2. Please obtain BMP to evaluate electrolytes and kidney function 3. Please also check CBC to evaluate Hg and Hct levels 4. Pt made aware of need to continue Cipro and Flagyl upon discharge   Discharge Diagnoses: SBO Active Problems:   Abdominal pain, acute  Discharge Condition: Stable  Diet recommendation: Heart healthy diet discussed in details   Brief narrative:  Pt is 79 yo male who was discharged yesterday after being admitted for abd pain and was diagnosed with presumptive enteritis. Pt reports he felt better yesterday but as he continued to advance his diet he started to get more sick with more nausea and non bloody vomiting. Pt denies similar events in the past, no recent sick contacts or exposures. Pt reports abd pain is crampy like, intermittent and 5/10 in severity, worse with eating solids, no other specific alleviating or aggravating factors. Pt admitted from home after talking to myself.   Assessment and Plan:  Active Problems:  SBO  - NGT removed and pt reports feeling better, tolerated breakfast well  - appreciate surgery input  - pt remains afebrile and with no leukocytosis but has been on ABX since August 26th, 2015  - continue empiric ABX  - provide analgesia and antiemetics as needed  - pt wants to go home  HTN - reasonable inpatient control  Hyponatremia  - secondary to pre renal etiology  - sodium improving: 129 --> 134 --> 140-->141 --> 140 this AM  Hypokalemia  - supplemented and WNL this AM  Acute blood loss anemia  - likely dilutional  - no signs of active bleeding   DVT prophylaxis  Lovenox SQ while pt is in hospital  Code Status: Full  Family Communication: Pt at bedside   Disposition Plan: Home    IV Access:   Peripheral IV Procedures and diagnostic studies:   Dg Abd Acute W/chest 08/21/2014 Fluid distention of stomach with gas distention of small bowel in the right upper quadrant suggesting proximal small bowel obstruction.  Dg Abd 1 View 08/24/2014 Stable enteric tube. Stable bowel gas pattern since yesterday with suggestion of mild or partial residual small bowel obstruction.  Medical Consultants:   Surgery  Other Consultants:   None  Anti-Infectives:   Cipro 8/27 -->  Flagyl 8/27 -->   Discharge Exam: Filed Vitals:   08/28/14 1400  BP: 138/82  Pulse: 72  Temp: 98.1 F (36.7 C)  Resp: 18   Filed Vitals:   08/27/14 1447 08/27/14 2056 08/28/14 0511 08/28/14 1400  BP: 123/61 146/74 134/79 138/82  Pulse: 67 60 74 72  Temp: 98.4 F (36.9 C) 97.8 F (36.6 C) 98.1 F (36.7 C) 98.1 F (36.7 C)  TempSrc: Oral Oral Oral Oral  Resp: Height:      Weight:      SpO2: 100% 100% 98% 98%    General: Pt is alert, follows commands appropriately, not in acute distress Cardiovascular: Regular rate and rhythm, S1/S2 +, no murmurs, no rubs, no gallops Respiratory: Clear to auscultation bilaterally, no wheezing, no crackles, no rhonchi Abdominal: Soft, non tender, non distended, bowel sounds +, no guarding Extremities: no edema, no cyanosis, pulses palpable bilaterally DP and PT Neuro: Grossly nonfocal  Discharge Instructions  Discharge  Instructions   Diet - low sodium heart healthy    Complete by:  As directed      Increase activity slowly    Complete by:  As directed             Medication List         bimatoprost 0.01 % Soln  Commonly known as:  LUMIGAN  Place 1 drop into both eyes at bedtime.     Cinnamon 500 MG Tabs  Take 500 mg by mouth daily.     ciprofloxacin 500 MG tablet  Commonly known as:  CIPRO  Take 1 tablet (500 mg total) by mouth 2 (two) times daily.     CO Q 10 PO  Take 1 capsule by mouth 2 (two) times  daily.     COSAMIN DS 500-400 MG Caps  Generic drug:  Glucosamine-Chondroitin  Take 1 tablet by mouth 2 (two) times daily.     diltiazem 300 MG 24 hr capsule  Commonly known as:  CARDIZEM CD  Take 1 capsule (300 mg total) by mouth daily.     fish oil-omega-3 fatty acids 1000 MG capsule  Take 1 g by mouth 3 (three) times daily.     FLAX SEED OIL PO  Take 1 capsule by mouth 3 (three) times daily.     Hydrocodone-Acetaminophen 5-300 MG Tabs  Commonly known as:  VICODIN  Take 1 tablet by mouth every 6 (six) hours as needed.     levobunolol 0.5 % ophthalmic solution  Commonly known as:  BETAGAN  Place 1 drop into both eyes every morning.     metroNIDAZOLE 500 MG tablet  Commonly known as:  FLAGYL  Take 1 tablet (500 mg total) by mouth 3 (three) times daily.     multivitamin with minerals tablet  Take 1 tablet by mouth 2 (two) times daily.     saccharomyces boulardii 250 MG capsule  Commonly known as:  FLORASTOR  Take 1 capsule (250 mg total) by mouth 2 (two) times daily.     sildenafil 100 MG tablet  Commonly known as:  VIAGRA  TAKE 1 TABLET (100 MG TOTAL) AS NEEDED FOR ERECTILE DYSFUNCTION     terazosin 2 MG capsule  Commonly known as:  HYTRIN  Take 4 mg by mouth at bedtime.     ZYRTEC 10 MG tablet  Generic drug:  cetirizine  Take 10 mg by mouth daily.           Follow-up Information   Schedule an appointment as soon as possible for a visit with [name] Salisbury, MD.   Specialty:  Family Medicine   Contact information:   [address] [place] [address] [phone]       Follow up with [name] Presto, MD. (As needed call my cell phone [phone])    Specialty:  Internal Medicine   Contact information:   [address] [place] [address] [phone]        The results of significant diagnostics from this hospitalization (including imaging, microbiology, ancillary and laboratory) are listed below for reference.      Microbiology: Recent Results (from the past 240 hour(s))  URINE CULTURE     Status: None   Collection Time    08/22/14  5:06 AM      Result Value Ref Range Status   Specimen Description URINE, CLEAN CATCH   Final   Special Requests NONE   Final   Culture  Setup Time  Final   Value: 08/22/2014 08:26     Performed at [facility] Count     Final   Value: NO GROWTH     Performed at [facility]   Culture     Final   Value: NO GROWTH     Performed at [facility]   Report Status 08/23/2014 FINAL   Final  CLOSTRIDIUM DIFFICILE BY PCR     Status: None   Collection Time    08/28/14  2:58 PM      Result Value Ref Range Status   C difficile by pcr NEGATIVE  NEGATIVE Final   Comment: Performed at [facility]     Labs: Basic Metabolic Panel:  Recent Labs Lab 08/21/14 2017  08/23/14 0020 08/24/14 0855 08/25/14 0005 08/27/14 0009 08/28/14 0010  NA 132*  < > 134* 140 141 140 139  K 3.7  < > 3.2* 3.7 3.6* 4.0 3.9  CL 89*  < > 95* 100 101 105 105  CO2 27  < > GLUCOSE 197*  < > 135* 104* 118* 146* 120*  BUN 20  < > 5* 6  CREATININE 0.97  < > 1.20 1.09 1.03 0.95 0.96  CALCIUM 10.1  < > 8.6 8.7 8.5 8.0* 7.9*  MG 1.9  --   --   --   --   --   --   PHOS 3.1  --   --   --   --   --   --   < > = values in this interval not displayed. Liver Function Tests:  Recent Labs Lab 08/21/14 2017 08/24/14 0855  AST 35 31  ALT 17 24  ALKPHOS 45 38*  BILITOT 0.7 0.6  PROT 7.5 5.8*  ALBUMIN 4.3 3.1*    Recent Labs Lab 08/21/14 2017  LIPASE 26   No results found for this basename: AMMONIA,  in the last 168 hours CBC:  Recent Labs Lab 08/22/14 0405 08/23/14 0020 08/25/14 0005 08/27/14 0009 08/28/14 0010  WBC 8.2 7.3 5.1 4.8 4.4  HGB 14.0 12.4* 12.9* 12.2* 11.7*  HCT 41.7 36.3* 39.8 37.1* 36.4*  MCV 79.0 79.3 81.9 82.1 81.8  PLT 193 177 182 191 218   Cardiac Enzymes: No results found for this  basename: CKTOTAL, CKMB, CKMBINDEX, TROPONINI,  in the last 168 hours BNP: BNP (last 3 results) No results found for this basename: PROBNP,  in the last 8760 hours CBG: No results found for this basename: GLUCAP,  in the last 168 hours   SIGNED: Time coordinating discharge: Over 30 minutes  [name] Presto, MD  [facility] 08/28/2014, 4:56 PM Pager [phone]  If 7PM-7AM, please contact night-coverage [email or web address] Password [email or web address]

## 2014-08-28 NOTE — Progress Notes (Signed)
General Surgery [facility]  Agree with Will Marlyne Beards assessment.  Obstruction resolved.  Further management per medical service.  Please call if surgical issues arise.  Velora Heckler, MD, [facility] Office: [phone]

## 2014-08-28 NOTE — Progress Notes (Signed)
  Subjective: He is anxious about having loose stools now.  He has had 2 this AM.  Tolerating PO's well.    Objective: Vital signs in last 24 hours: Temp:  [97.8 F (36.6 C)-98.4 F (36.9 C)] 98.1 F (36.7 C) (09/03 0511) Pulse Rate:  [60-74] 74 (09/03 0511) Resp:  [16-18] 18 (09/03 0511) BP: (123-146)/(61-79) 134/79 mmHg (09/03 0511) SpO2:  [98 %-100 %] 98 % (09/03 0511) Last BM Date: 08/26/14 780 PO  No BM recorded Soft diet Afebrile, VSS WBC is normal Intake/Output from previous day: 09/02 0701 - 09/03 0700 In: 780 [P.O.:780] Out: 250 [Urine:250] Intake/Output this shift:    General appearance: alert, cooperative and no distress GI: soft, non-tender; bowel sounds normal; no masses,  no organomegaly  Lab Results:   Recent Labs  08/27/14 0009 08/28/14 0010  WBC 4.8 4.4  HGB 12.2* 11.7*  HCT 37.1* 36.4*  PLT 191 218    BMET  Recent Labs  08/27/14 0009 08/28/14 0010  NA 140 139  K 4.0 3.9  CL 105 105  CO2 26 25  GLUCOSE 146* 120*  BUN 5* 6  CREATININE 0.95 0.96  CALCIUM 8.0* 7.9*   PT/INR No results found for this basename: LABPROT, INR,  in the last 72 hours   Recent Labs Lab 08/21/14 2017 08/24/14 0855  AST 35 31  ALT 17 24  ALKPHOS 45 38*  BILITOT 0.7 0.6  PROT 7.5 5.8*  ALBUMIN 4.3 3.1*     Lipase     Component Value Date/Time   LIPASE 26 08/21/2014 2017     Studies/Results: No results found.  Medications: . ciprofloxacin  250 mg Oral BID  . diltiazem  300 mg Oral Daily  . enoxaparin (LOVENOX) injection  40 mg Subcutaneous Q24H  . latanoprost  1 drop Both Eyes QHS  . levobunolol  1 drop Both Eyes Daily  . metroNIDAZOLE  500 mg Oral 3 times per day  . pantoprazole  40 mg Oral Daily  . terazosin  4 mg Oral QHS    Assessment/Plan  1. SBO/gastric distension vs enteritis with obstruction  2. Hx of prior [facility] repair  3. Hypertension  4. Hx of diverticulosis and internal hemorrhoids, no hx of diverticulitis  5. Hx of  hyperglycemia  6. IBS on list but pt denies any diagnosis of this in the past.  7. ED   Plan:  I will add some Probiotics, and be available as needed.   LOS: 7 days    Dorothe Elmore 08/28/2014

## 2014-09-03 ENCOUNTER — Ambulatory Visit (INDEPENDENT_AMBULATORY_CARE_PROVIDER_SITE_OTHER): Payer: Medicare Other | Admitting: Family Medicine

## 2014-09-03 ENCOUNTER — Encounter: Payer: Self-pay | Admitting: Family Medicine

## 2014-09-03 VITALS — BP 140/78 | HR 69 | Temp 98.3°F | Ht 73.0 in | Wt 200.0 lb

## 2014-09-03 DIAGNOSIS — K5289 Other specified noninfective gastroenteritis and colitis: Secondary | ICD-10-CM

## 2014-09-03 DIAGNOSIS — K529 Noninfective gastroenteritis and colitis, unspecified: Secondary | ICD-10-CM

## 2014-09-03 LAB — BASIC METABOLIC PANEL
BUN: 7 mg/dL (ref 6–23)
CO2: 29 mEq/L (ref 19–32)
Calcium: 9.3 mg/dL (ref 8.4–10.5)
Chloride: 104 mEq/L (ref 96–112)
Creatinine, Ser: 0.9 mg/dL (ref 0.4–1.5)
GFR: 102.6 mL/min (ref >60.00)
Glucose, Bld: 81 mg/dL (ref 70–99)
Potassium: 4.7 mEq/L (ref 3.5–5.1)
Sodium: 139 mEq/L (ref 135–145)

## 2014-09-03 LAB — CBC WITH DIFFERENTIAL/PLATELET
Basophils Absolute: 0 10*3/uL (ref 0.0–0.1)
Basophils Relative: 0.8 % (ref 0.0–3.0)
Eosinophils Absolute: 0.2 10*3/uL (ref 0.0–0.7)
Eosinophils Relative: 5.2 % — ABNORMAL HIGH (ref 0.0–5.0)
HCT: 39.8 % (ref 39.0–52.0)
Hemoglobin: 12.7 g/dL — ABNORMAL LOW (ref 13.0–17.0)
Lymphocytes Relative: 24.6 % (ref 12.0–46.0)
Lymphs Abs: 1.1 10*3/uL (ref 0.7–4.0)
MCHC: 32 g/dL (ref 30.0–36.0)
MCV: 83.6 fl (ref 78.0–100.0)
Monocytes Absolute: 0.7 10*3/uL (ref 0.1–1.0)
Monocytes Relative: 15.1 % — ABNORMAL HIGH (ref 3.0–12.0)
Neutro Abs: 2.4 10*3/uL (ref 1.4–7.7)
Neutrophils Relative %: 54.3 % (ref 43.0–77.0)
Platelets: 347 10*3/uL (ref 150.0–400.0)
RBC: 4.77 Mil/uL (ref 4.22–5.81)
RDW: 14.5 % (ref 11.5–15.5)
WBC: 4.4 10*3/uL (ref 4.0–10.5)

## 2014-09-03 NOTE — Progress Notes (Signed)
   Subjective:    Patient ID: [name] [name], male    DOB: 1935/09/23, 80 y.o.   MRN: [id number]  HPI Here to follow up a hospital stay from 08-21-14 to 08-28-14 for an apparent enteritis. He presented with bloating and abdominal pain and Xrays revealed a partial SBO. Labs were unrevealing. All cultures were negative. He was NPO for a time and then changed to a liquid diet, where he remains. He was sent home on Flagyl and Cipro. His abdominal symptoms have all resolved and his BMs are back to normal. However he mentions having stiffness and pain in both Achilles tendons and both calves.    Review of Systems  Constitutional: Negative.   Respiratory: Negative.   Cardiovascular: Negative.   Gastrointestinal: Negative.        Objective:   Physical Exam  Constitutional: He appears well-developed and well-nourished.  Cardiovascular: Normal rate, regular rhythm, normal heart sounds and intact distal pulses.   Pulmonary/Chest: Effort normal and breath sounds normal.  Abdominal: Soft. Bowel sounds are normal. He exhibits no distension and no mass. There is no tenderness. There is no rebound and no guarding.          Assessment & Plan:  He seems to have recovered. We will get labs to day to follow up abnormalities likely due to dehydration. He will advance his diet ans tolerated now. Stop both antibiotics. He is likely having a tendonopathy reaction to the Cipro.

## 2014-09-03 NOTE — Progress Notes (Signed)
Pre visit review using our clinic review tool, if applicable. No additional management support is needed unless otherwise documented below in the visit note. 

## 2014-10-15 ENCOUNTER — Ambulatory Visit (INDEPENDENT_AMBULATORY_CARE_PROVIDER_SITE_OTHER): Payer: Medicare Other

## 2014-10-15 DIAGNOSIS — Z23 Encounter for immunization: Secondary | ICD-10-CM

## 2014-12-24 ENCOUNTER — Other Ambulatory Visit: Payer: Self-pay | Admitting: Family Medicine

## 2014-12-31 ENCOUNTER — Other Ambulatory Visit (INDEPENDENT_AMBULATORY_CARE_PROVIDER_SITE_OTHER): Payer: Medicare Other

## 2014-12-31 DIAGNOSIS — E785 Hyperlipidemia, unspecified: Secondary | ICD-10-CM

## 2014-12-31 DIAGNOSIS — Z125 Encounter for screening for malignant neoplasm of prostate: Secondary | ICD-10-CM

## 2014-12-31 DIAGNOSIS — I10 Essential (primary) hypertension: Secondary | ICD-10-CM

## 2014-12-31 DIAGNOSIS — Z0000 Encounter for general adult medical examination without abnormal findings: Secondary | ICD-10-CM

## 2014-12-31 LAB — LIPID PANEL
Cholesterol: 272 mg/dL — ABNORMAL HIGH (ref 0–200)
HDL: 83.9 mg/dL (ref >39.00)
LDL Cholesterol: 175 mg/dL — ABNORMAL HIGH (ref 0–99)
NonHDL: 188.1
Total CHOL/HDL Ratio: 3
Triglycerides: 68 mg/dL (ref 0.0–149.0)
VLDL: 13.6 mg/dL (ref 0.0–40.0)

## 2014-12-31 LAB — CBC WITH DIFFERENTIAL/PLATELET
Basophils Absolute: 0 10*3/uL (ref 0.0–0.1)
Basophils Relative: 0.8 % (ref 0.0–3.0)
Eosinophils Absolute: 0.3 10*3/uL (ref 0.0–0.7)
Eosinophils Relative: 8.6 % — ABNORMAL HIGH (ref 0.0–5.0)
HCT: 46 % (ref 39.0–52.0)
Hemoglobin: 14.7 g/dL (ref 13.0–17.0)
Lymphocytes Relative: 38.4 % (ref 12.0–46.0)
Lymphs Abs: 1.2 10*3/uL (ref 0.7–4.0)
MCHC: 32 g/dL (ref 30.0–36.0)
MCV: 83 fl (ref 78.0–100.0)
Monocytes Absolute: 0.3 10*3/uL (ref 0.1–1.0)
Monocytes Relative: 10.6 % (ref 3.0–12.0)
Neutro Abs: 1.3 10*3/uL — ABNORMAL LOW (ref 1.4–7.7)
Neutrophils Relative %: 41.6 % — ABNORMAL LOW (ref 43.0–77.0)
Platelets: 214 10*3/uL (ref 150.0–400.0)
RBC: 5.54 Mil/uL (ref 4.22–5.81)
RDW: 14 % (ref 11.5–15.5)
WBC: 3 10*3/uL — ABNORMAL LOW (ref 4.0–10.5)

## 2014-12-31 LAB — COMPREHENSIVE METABOLIC PANEL
ALT: 19 U/L (ref 0–53)
AST: 22 U/L (ref 0–37)
Albumin: 4.6 g/dL (ref 3.5–5.2)
Alkaline Phosphatase: 42 U/L (ref 39–117)
BUN: 11 mg/dL (ref 6–23)
CO2: 29 mEq/L (ref 19–32)
Calcium: 9.7 mg/dL (ref 8.4–10.5)
Chloride: 106 mEq/L (ref 96–112)
Creatinine, Ser: 1 mg/dL (ref 0.4–1.5)
GFR: 97.6 mL/min (ref >60.00)
Glucose, Bld: 116 mg/dL — ABNORMAL HIGH (ref 70–99)
Potassium: 4.7 mEq/L (ref 3.5–5.1)
Sodium: 140 mEq/L (ref 135–145)
Total Bilirubin: 0.7 mg/dL (ref 0.2–1.2)
Total Protein: 6.8 g/dL (ref 6.0–8.3)

## 2014-12-31 LAB — POCT URINALYSIS DIPSTICK
Bilirubin, UA: NEGATIVE
Blood, UA: NEGATIVE
Glucose, UA: NEGATIVE
Ketones, UA: NEGATIVE
Leukocytes, UA: NEGATIVE
Nitrite, UA: NEGATIVE
Protein, UA: NEGATIVE
Spec Grav, UA: 1.02
Urobilinogen, UA: 0.2
pH, UA: 7

## 2014-12-31 LAB — PSA: PSA: 0.7 ng/mL (ref 0.10–4.00)

## 2014-12-31 LAB — TSH: TSH: 1.66 u[IU]/mL (ref 0.35–4.50)

## 2015-01-06 ENCOUNTER — Encounter: Payer: Self-pay | Admitting: Family Medicine

## 2015-01-06 ENCOUNTER — Ambulatory Visit (INDEPENDENT_AMBULATORY_CARE_PROVIDER_SITE_OTHER): Payer: Medicare Other | Admitting: Family Medicine

## 2015-01-06 VITALS — BP 160/92 | HR 73 | Temp 98.4°F | Ht 73.0 in | Wt 200.0 lb

## 2015-01-06 DIAGNOSIS — Z0000 Encounter for general adult medical examination without abnormal findings: Secondary | ICD-10-CM

## 2015-01-06 DIAGNOSIS — I10 Essential (primary) hypertension: Secondary | ICD-10-CM

## 2015-01-06 MED ORDER — TERAZOSIN HCL 2 MG PO CAPS: ORAL_CAPSULE | ORAL | Status: DC

## 2015-01-06 MED ORDER — DILTIAZEM HCL ER COATED BEADS 300 MG PO CP24: 300.0000 mg | ORAL_CAPSULE | Freq: Every day | ORAL | Status: DC

## 2015-01-06 NOTE — Progress Notes (Signed)
Pre visit review using our clinic review tool, if applicable. No additional management support is needed unless otherwise documented below in the visit note. 

## 2015-01-06 NOTE — Progress Notes (Signed)
   Subjective:    Patient ID: [name] BenceJoe L Jeune, male    DOB: 1935/10/21, 80 y.o.   MRN: [id number]  HPI 80 yr old male for a cpx. He feels fine. His BP at home is stable.    Review of Systems  Constitutional: Negative.   HENT: Negative.   Eyes: Negative.   Respiratory: Negative.   Cardiovascular: Negative.   Gastrointestinal: Negative.   Genitourinary: Negative.   Musculoskeletal: Negative.   Skin: Negative.   Neurological: Negative.   Psychiatric/Behavioral: Negative.        Objective:   Physical Exam  Constitutional: He is oriented to person, place, and time. He appears well-developed and well-nourished. No distress.  HENT:  Head: Normocephalic and atraumatic.  Right Ear: External ear normal.  Left Ear: External ear normal.  Nose: Nose normal.  Mouth/Throat: Oropharynx is clear and moist. No oropharyngeal exudate.  Eyes: Conjunctivae and EOM are normal. Pupils are equal, round, and reactive to light. Right eye exhibits no discharge. Left eye exhibits no discharge. No scleral icterus.  Neck: Neck supple. No JVD present. No tracheal deviation present. No thyromegaly present.  Cardiovascular: Normal rate, regular rhythm, normal heart sounds and intact distal pulses.  Exam reveals no gallop and no friction rub.   No murmur heard. EKG stable with RBBB  Pulmonary/Chest: Effort normal and breath sounds normal. No respiratory distress. He has no wheezes. He has no rales. He exhibits no tenderness.  Abdominal: Soft. Bowel sounds are normal. He exhibits no distension and no mass. There is no tenderness. There is no rebound and no guarding.  Genitourinary: Rectum normal, prostate normal and penis normal. Guaiac negative stool. No penile tenderness.  Musculoskeletal: Normal range of motion. He exhibits no edema or tenderness.  Lymphadenopathy:    He has no cervical adenopathy.  Neurological: He is alert and oriented to person, place, and time. He has normal reflexes. No cranial nerve  deficit. He exhibits normal muscle tone. Coordination normal.  Skin: Skin is warm and dry. No rash noted. He is not diaphoretic. No erythema. No pallor.  Psychiatric: He has a normal mood and affect. His behavior is normal. Judgment and thought content normal.          Assessment & Plan:  Well exam.

## 2015-04-17 ENCOUNTER — Encounter: Payer: Self-pay | Admitting: Gastroenterology

## 2015-09-16 ENCOUNTER — Ambulatory Visit (INDEPENDENT_AMBULATORY_CARE_PROVIDER_SITE_OTHER): Payer: Medicare Other | Admitting: Family Medicine

## 2015-09-16 DIAGNOSIS — Z23 Encounter for immunization: Secondary | ICD-10-CM

## 2016-01-06 ENCOUNTER — Emergency Department (HOSPITAL_COMMUNITY)
Admission: EM | Admit: 2016-01-06 | Discharge: 2016-01-06 | Disposition: A | Discharge status: Home / Self Care | Payer: Medicare Other | Attending: Emergency Medicine | Admitting: Emergency Medicine

## 2016-01-06 ENCOUNTER — Emergency Department (HOSPITAL_COMMUNITY): Payer: Medicare Other

## 2016-01-06 ENCOUNTER — Encounter (HOSPITAL_COMMUNITY): Payer: Self-pay | Admitting: Emergency Medicine

## 2016-01-06 DIAGNOSIS — I10 Essential (primary) hypertension: Secondary | ICD-10-CM | POA: Diagnosis not present

## 2016-01-06 DIAGNOSIS — Z79899 Other long term (current) drug therapy: Secondary | ICD-10-CM | POA: Diagnosis not present

## 2016-01-06 DIAGNOSIS — R509 Fever, unspecified: Secondary | ICD-10-CM | POA: Insufficient documentation

## 2016-01-06 DIAGNOSIS — Z8719 Personal history of other diseases of the digestive system: Secondary | ICD-10-CM | POA: Diagnosis not present

## 2016-01-06 DIAGNOSIS — E785 Hyperlipidemia, unspecified: Secondary | ICD-10-CM | POA: Insufficient documentation

## 2016-01-06 DIAGNOSIS — R05 Cough: Secondary | ICD-10-CM | POA: Insufficient documentation

## 2016-01-06 DIAGNOSIS — M791 Myalgia: Secondary | ICD-10-CM | POA: Insufficient documentation

## 2016-01-06 DIAGNOSIS — Z87891 Personal history of nicotine dependence: Secondary | ICD-10-CM | POA: Insufficient documentation

## 2016-01-06 DIAGNOSIS — R0981 Nasal congestion: Secondary | ICD-10-CM | POA: Diagnosis not present

## 2016-01-06 DIAGNOSIS — N529 Male erectile dysfunction, unspecified: Secondary | ICD-10-CM | POA: Insufficient documentation

## 2016-01-06 LAB — COMPREHENSIVE METABOLIC PANEL
ALT: 30 U/L (ref 17–63)
AST: 50 U/L — ABNORMAL HIGH (ref 15–41)
Albumin: 4.4 g/dL (ref 3.5–5.0)
Alkaline Phosphatase: 48 U/L (ref 38–126)
Anion gap: 10 (ref 5–15)
BUN: 13 mg/dL (ref 6–20)
CO2: 27 mmol/L (ref 22–32)
Calcium: 9 mg/dL (ref 8.9–10.3)
Chloride: 101 mmol/L (ref 101–111)
Creatinine, Ser: 0.88 mg/dL (ref 0.61–1.24)
GFR calc Af Amer: >60 mL/min (ref >60)
GFR calc non Af Amer: >60 mL/min (ref >60)
Glucose, Bld: 136 mg/dL — ABNORMAL HIGH (ref 65–99)
Potassium: 3.7 mmol/L (ref 3.5–5.1)
Sodium: 138 mmol/L (ref 135–145)
Total Bilirubin: 0.7 mg/dL (ref 0.3–1.2)
Total Protein: 7.7 g/dL (ref 6.5–8.1)

## 2016-01-06 LAB — URINALYSIS, ROUTINE W REFLEX MICROSCOPIC
Bilirubin Urine: NEGATIVE
Glucose, UA: NEGATIVE mg/dL
Ketones, ur: 40 mg/dL — AB
Leukocytes, UA: NEGATIVE
Nitrite: NEGATIVE
Protein, ur: 30 mg/dL — AB
Specific Gravity, Urine: 1.015 (ref 1.005–1.030)
pH: 6.5 (ref 5.0–8.0)

## 2016-01-06 LAB — URINE MICROSCOPIC-ADD ON: Squamous Epithelial / LPF: NONE SEEN

## 2016-01-06 LAB — CBC WITH DIFFERENTIAL/PLATELET
Basophils Absolute: 0 10*3/uL (ref 0.0–0.1)
Basophils Relative: 0 %
Eosinophils Absolute: 0 10*3/uL (ref 0.0–0.7)
Eosinophils Relative: 0 %
HCT: 44.6 % (ref 39.0–52.0)
Hemoglobin: 13.8 g/dL (ref 13.0–17.0)
Lymphocytes Relative: 11 %
Lymphs Abs: 0.9 10*3/uL (ref 0.7–4.0)
MCH: 25.9 pg — ABNORMAL LOW (ref 26.0–34.0)
MCHC: 30.9 g/dL (ref 30.0–36.0)
MCV: 83.8 fL (ref 78.0–100.0)
Monocytes Absolute: 0.8 10*3/uL (ref 0.1–1.0)
Monocytes Relative: 10 %
Neutro Abs: 6.5 10*3/uL (ref 1.7–7.7)
Neutrophils Relative %: 79 %
Platelets: 193 10*3/uL (ref 150–400)
RBC: 5.32 MIL/uL (ref 4.22–5.81)
RDW: 13.5 % (ref 11.5–15.5)
WBC: 8.3 10*3/uL (ref 4.0–10.5)

## 2016-01-06 LAB — I-STAT CG4 LACTIC ACID, ED: Lactic Acid, Venous: 1.88 mmol/L (ref 0.5–2.0)

## 2016-01-06 NOTE — ED Notes (Addendum)
Call pts wife for information: [phone] cell, home: [phone]

## 2016-01-06 NOTE — Discharge Instructions (Signed)
Take tylenol 2 pills 4 times a day and motrin 4 pills 3 times a day.  Drink plenty of fluids.  Return for worsening shortness of breath, headache, confusion. Follow up with your family doctor.  ° °Fever, Adult °A fever is an increase in the body's temperature. It is often defined as a temperature of 100° F (38°C) or higher. Short mild or moderate fevers often have no long-term effects. They also often do not need treatment. Moderate or high fevers may make you feel uncomfortable. Sometimes, they can also be a sign of a serious illness or disease. The sweating that may happen with repeated fevers or fevers that last a while may also cause you to not have enough fluid in your body (dehydration). °You can take your temperature with a thermometer to see if you have a fever. A measured temperature can change with: °· Age. °· Time of day. °· Where the thermometer is placed: °¨ Mouth (oral). °¨ Rectum (rectal). °¨ Ear (tympanic). °¨ Underarm (axillary). °¨ Forehead (temporal). °HOME CARE °Pay attention to any changes in your symptoms. Take these actions to help with your condition: °· Take over-the-counter and prescription medicines only as told by your doctor. Follow the dosing instructions carefully. °· If you were prescribed an antibiotic medicine, take it as told by your doctor. Do not stop taking the antibiotic even if you start to feel better. °· Rest as needed. °· Drink enough fluid to keep your pee (urine) clear or pale yellow. °· Sponge yourself or bathe with room-temperature water as needed. This helps to lower your body temperature . Do not use ice water. °· Do not wear too many blankets or heavy clothes. °GET HELP IF: °· You throw up (vomit). °· You cannot eat or drink without throwing up. °· You have watery poop (diarrhea). °· It hurts when you pee. °· Your symptoms do not get better with treatment. °· You have new symptoms. °· You feel very weak. °GET HELP RIGHT AWAY IF: °· You are short of breath or have  trouble breathing. °· You are dizzy or you pass out (faint). °· You feel confused. °· You have signs of not having enough fluid in your body, such as: °¨ A dry mouth. °¨ Peeing less. °¨ Looking pale. °· You have very bad pain in your belly (abdomen). °· You keep throwing up or having water poop. °· You have a skin rash. °· Your symptoms suddenly get worse. °  °This information is not intended to replace advice given to you by your health care provider. Make sure you discuss any questions you have with your health care provider. °  °Document Released: 09/20/2008 Document Revised: 09/02/2015 Document Reviewed: 02/05/2015 °Elsevier Interactive Patient Education ©2016 [employer] ° °

## 2016-01-06 NOTE — ED Notes (Signed)
Bed: WA17 Expected date:  Expected time:  Means of arrival:  Comments: Ems-gen weakness and fever 4875m

## 2016-01-06 NOTE — ED Provider Notes (Signed)
CSN: [id number]     Arrival date & time 01/06/16  1009 History   First MD Initiated Contact with Patient 01/06/16 1012     Chief Complaint  Patient presents with  . Fever     (Consider location/radiation/quality/duration/timing/severity/associated sxs/prior Treatment) Patient is a 81 y.o. male presenting with fever and general illness. The history is provided by the patient.  Fever Associated symptoms: congestion, cough and myalgias   Associated symptoms: no chest pain, no chills, no confusion, no diarrhea, no headaches, no rash and no vomiting   Illness Severity:  Moderate Onset quality:  Sudden Duration:  2 days Timing:  Constant Progression:  Worsening Chronicity:  New Associated symptoms: congestion, cough, fever and myalgias   Associated symptoms: no abdominal pain, no chest pain, no diarrhea, no headaches, no rash, no shortness of breath and no vomiting     81 yo M with a chief complaint of upper respiratory illness. This been going on for about a week. Patient having cough congestion fevers. This had mildly improved he felt that the congestion had moved into his chest. Patient was confused while at home and feeling more weak than normal. Decreased oral intake. Was unable to get out of bed today and slid onto the floor. Denies head injury or loss of consciousness.  Past Medical History  Diagnosis Date  . Allergy   . Hyperlipidemia   . Hypertension   . Hyperglycemia   . ED (erectile dysfunction)   . Diverticulosis of colon (without mention of hemorrhage) 03-01-2004, 04-04-2011    Colonoscopy  . Internal hemorrhoids 03-15-1999    Flex Sig   . Irritable bowel    Past Surgical History  Procedure Laterality Date  . Inguinal hernia repair    . Knee arthroscopy      right knee  . Nasal sinus surgery    . Eye surgery      bilateral cataract extraction per Dr Jettie Pagan  . Colonoscopy  04-04-11    per Dr. Jarold Motto, clear, no repeats needed   Family History  Problem Relation Age  of Onset  . Hypertension Mother   . Lung cancer Father   . Colitis Daughter    Social History  Substance Use Topics  . Smoking status: Former Smoker    Quit date: 03/20/1969  . Smokeless tobacco: Never Used  . Alcohol Use: Yes     Comment: occ    Review of Systems  Constitutional: Positive for fever. Negative for chills.  HENT: Positive for congestion. Negative for facial swelling.   Eyes: Negative for discharge and visual disturbance.  Respiratory: Positive for cough. Negative for shortness of breath.   Cardiovascular: Negative for chest pain and palpitations.  Gastrointestinal: Negative for vomiting, abdominal pain and diarrhea.  Musculoskeletal: Positive for myalgias. Negative for arthralgias.  Skin: Negative for color change and rash.  Neurological: Negative for tremors, syncope and headaches.  Psychiatric/Behavioral: Negative for confusion and dysphoric mood.      Allergies  Ciprofloxacin and Lipitor  Home Medications   Prior to Admission medications   Medication Sig Start Date End Date Taking? Authorizing Provider  bimatoprost (LUMIGAN) 0.01 % SOLN Place 1 drop into both eyes at bedtime.   Yes Historical Provider, MD  cetirizine (ZYRTEC) 10 MG tablet Take 10 mg by mouth daily as needed for allergies.    Yes Historical Provider, MD  Cinnamon 500 MG TABS Take 500 mg by mouth daily.    Yes Historical Provider, MD  Coenzyme Q10 (CO Q 10 PO)  Take 1 capsule by mouth 2 (two) times daily.    Yes Historical Provider, MD  diltiazem (CARDIZEM CD) 300 MG 24 hr capsule Take 1 capsule (300 mg total) by mouth daily. 01/06/15  Yes Nelwyn SalisburyStephen A Fry, MD  fish oil-omega-3 fatty acids 1000 MG capsule Take 1 g by mouth daily.    Yes Historical Provider, MD  Flaxseed, Linseed, (FLAX SEED OIL PO) Take 1 capsule by mouth 3 (three) times daily.    Yes Historical Provider, MD  Glucosamine-Chondroitin (COSAMIN DS) 500-400 MG CAPS Take 1 tablet by mouth 2 (two) times daily.   Yes Historical  Provider, MD  guaiFENesin (MUCINEX) 600 MG 12 hr tablet Take 600 mg by mouth 2 (two) times daily as needed for cough.   Yes Historical Provider, MD  levobunolol (BETAGAN) 0.5 % ophthalmic solution Place 1 drop into both eyes every morning.    Yes Historical Provider, MD  Multiple Vitamins-Minerals (MULTIVITAMIN WITH MINERALS) tablet Take 1 tablet by mouth 2 (two) times daily.    Yes Historical Provider, MD  sildenafil (VIAGRA) 100 MG tablet TAKE 1 TABLET (100 MG TOTAL) AS NEEDED FOR ERECTILE DYSFUNCTION 10/30/13  Yes Nelwyn SalisburyStephen A Fry, MD  terazosin (HYTRIN) 2 MG capsule TAKE 2 CAPSULES (4 MG) AT BEDTIME Patient taking differently: 4 mg at bedtime. TAKE 2 CAPSULES (4 MG) AT BEDTIME 01/06/15  Yes Nelwyn SalisburyStephen A Fry, MD   BP 137/75 mmHg  Pulse 84  Temp(Src) 101.8 F (38.8 C) (Oral)  Resp 16  Ht 6\' 1"  (1.854 m)  Wt 205 lb (92.987 kg)  BMI 27.05 kg/m2  SpO2 94% Physical Exam  Constitutional: He is oriented to person, place, and time. He appears well-developed and well-nourished.  HENT:  Head: Normocephalic and atraumatic.  Swollen turbinates  Eyes: EOM are normal. Pupils are equal, round, and reactive to light.  Neck: Normal range of motion. Neck supple. No JVD present.  Cardiovascular: Normal rate and regular rhythm.  Exam reveals no gallop and no friction rub.   No murmur heard. Pulmonary/Chest: No respiratory distress. He has no wheezes. He has no rales. He exhibits no tenderness.  Abdominal: He exhibits no distension. There is no tenderness. There is no rebound and no guarding.  Musculoskeletal: Normal range of motion.  Neurological: He is alert and oriented to person, place, and time.  Skin: No rash noted. No pallor.  Psychiatric: He has a normal mood and affect. His behavior is normal.  Nursing note and vitals reviewed.   ED Course  Procedures (including critical care time) Labs Review Labs Reviewed  COMPREHENSIVE METABOLIC PANEL - Abnormal; Notable for the following:    Glucose, Bld  136 (*)    AST 50 (*)    All other components within normal limits  CBC WITH DIFFERENTIAL/PLATELET - Abnormal; Notable for the following:    MCH 25.9 (*)    All other components within normal limits  URINALYSIS, ROUTINE W REFLEX MICROSCOPIC (NOT [facility]) - Abnormal; Notable for the following:    Hgb urine dipstick TRACE (*)    Ketones, ur 40 (*)    Protein, ur 30 (*)    All other components within normal limits  URINE MICROSCOPIC-ADD ON - Abnormal; Notable for the following:    Bacteria, UA RARE (*)    Casts HYALINE CASTS (*)    All other components within normal limits  CULTURE, BLOOD (ROUTINE X 2)  CULTURE, BLOOD (ROUTINE X 2)  URINE CULTURE  I-STAT CG4 LACTIC ACID, ED    Imaging  Review Dg Chest 2 View  01/06/2016  CLINICAL DATA:  Weakness, fever, flu-like symptoms and congestion EXAM: CHEST  2 VIEW COMPARISON:  08/23/2014 FINDINGS: The lungs are hyperinflated likely secondary to COPD. There is mild bibasilar scarring. There is no focal consolidation. There is no pleural effusion or pneumothorax. There is stable cardiomegaly. The osseous structures are unremarkable. IMPRESSION: No active cardiopulmonary disease. Electronically Signed   By: Elige Ko   On: 01/06/2016 11:01   I have personally reviewed and evaluated these images and lab results as part of my medical decision-making.   EKG Interpretation None      MDM   Final diagnoses:  Fever, unspecified fever cause    81 yo M with a chief complaint of cough congestion fevers. Patient also had a fall earlier today. Denies any pain. Denies loss consciousness. Denies head injury. Chest x-ray and urine unremarkable. Patient given some gentle hydration with improvement. States he feels that he is ready to go home. Patient was able to ambulate. CT scan much better. Requesting discharge home.  2:58 PM:  I have discussed the diagnosis/risks/treatment options with the patient and believe the pt to be eligible for discharge home to  follow-up with PCP. We also discussed returning to the ED immediately if new or worsening sx occur. We discussed the sx which are most concerning (e.g., sudden worsening pain,persistent fever, inability to tolerate by mouth) that necessitate immediate return. Medications administered to the patient during their visit and any new prescriptions provided to the patient are listed below.  Medications given during this visit Medications - No data to display  New Prescriptions   No medications on file    The patient appears reasonably screen and/or stabilized for discharge and I doubt any other medical condition or other [facility] requiring further screening, evaluation, or treatment in the ED at this time prior to discharge.      Melene Plan, DO 01/06/16 1458

## 2016-01-06 NOTE — ED Notes (Signed)
Pt was able to urinate in urinal so in and out was not needed.

## 2016-01-06 NOTE — ED Notes (Signed)
Per [facility] pt from home, co weakness and slid out of bed, no injury, no loc per EMS. Reports flu like symptoms and congestion, was seen by PCP, and sent home with prescriptions. Pt alert yet confused per EMS .

## 2016-01-06 NOTE — ED Notes (Signed)
Attempted to call wife , all three available numbers, unable to have answer. Left a message to call back the ER

## 2016-01-06 NOTE — ED Notes (Signed)
Spoke to wife, she is on her way to pick up pt.

## 2016-01-07 LAB — URINE CULTURE

## 2016-01-11 LAB — CULTURE, BLOOD (ROUTINE X 2)
Culture: NO GROWTH
Culture: NO GROWTH

## 2016-01-13 ENCOUNTER — Ambulatory Visit (INDEPENDENT_AMBULATORY_CARE_PROVIDER_SITE_OTHER): Payer: Medicare Other | Admitting: Family Medicine

## 2016-01-13 ENCOUNTER — Encounter: Payer: Self-pay | Admitting: Family Medicine

## 2016-01-13 VITALS — BP 132/74 | HR 68 | Temp 97.8°F | Ht 73.0 in | Wt 200.0 lb

## 2016-01-13 DIAGNOSIS — B349 Viral infection, unspecified: Secondary | ICD-10-CM

## 2016-01-13 NOTE — Progress Notes (Signed)
   Subjective:    Patient ID: [name], male    DOB: 1935-08-16, 81 y.o.   MRN: [id number]  HPI Here wit is wife to follow up an ER visit on 01-06-16 for a week of fatigue, low grade fevers, and cough. He almost passed out at home and slid off is bed to the floor that day, but there were no injuries. His exam that day was normal, all labs were normal., and a CXR was clear except for some COPD. It was felt he had a viral illness of some sort and he was sent back home. Since then he has improved a lot, and except for a slight cough he feels back to normal. All cultures obtained at the ER visit were no growth.    Review of Systems  Constitutional: Negative.   HENT: Negative.   Eyes: Negative.   Respiratory: Negative for cough, chest tightness, shortness of breath and wheezing.   Cardiovascular: Negative.   Gastrointestinal: Negative.   Genitourinary: Negative.   Neurological: Negative.        Objective:   Physical Exam  Constitutional: He is oriented to person, place, and time. He appears well-developed and well-nourished.  HENT:  Right Ear: External ear normal.  Left Ear: External ear normal.  Nose: Nose normal.  Mouth/Throat: Oropharynx is clear and moist.  Eyes: Conjunctivae are normal.  Neck: No thyromegaly present.  Cardiovascular: Normal rate, regular rhythm, normal heart sounds and intact distal pulses.   Pulmonary/Chest: Effort normal and breath sounds normal. No respiratory distress. He has no wheezes. He has no rales.  Abdominal: Soft. Bowel sounds are normal. He exhibits no distension and no mass. There is no tenderness. There is no rebound and no guarding.  Lymphadenopathy:    He has no cervical adenopathy.  Neurological: He is alert and oriented to person, place, and time. No cranial nerve deficit. He exhibits normal muscle tone. Coordination normal.          Assessment & Plan:  He appears to be recovering from a probable viral illness. He will follow up if  anything else changes. He will schedule a cpx soon.

## 2016-01-13 NOTE — Progress Notes (Signed)
Pre visit review using our clinic review tool, if applicable. No additional management support is needed unless otherwise documented below in the visit note. 

## 2016-02-04 ENCOUNTER — Other Ambulatory Visit (INDEPENDENT_AMBULATORY_CARE_PROVIDER_SITE_OTHER): Payer: Medicare Other

## 2016-02-04 DIAGNOSIS — Z0000 Encounter for general adult medical examination without abnormal findings: Secondary | ICD-10-CM

## 2016-02-04 DIAGNOSIS — Z125 Encounter for screening for malignant neoplasm of prostate: Secondary | ICD-10-CM

## 2016-02-04 LAB — BASIC METABOLIC PANEL
BUN: 13 mg/dL (ref 6–23)
CO2: 32 mEq/L (ref 19–32)
Calcium: 9.5 mg/dL (ref 8.4–10.5)
Chloride: 104 mEq/L (ref 96–112)
Creatinine, Ser: 0.97 mg/dL (ref 0.40–1.50)
GFR: 96.17 mL/min (ref >60.00)
Glucose, Bld: 125 mg/dL — ABNORMAL HIGH (ref 70–99)
Potassium: 4.8 mEq/L (ref 3.5–5.1)
Sodium: 140 mEq/L (ref 135–145)

## 2016-02-04 LAB — POC URINALSYSI DIPSTICK (AUTOMATED)
Bilirubin, UA: NEGATIVE
Blood, UA: NEGATIVE
Glucose, UA: NEGATIVE
Ketones, UA: NEGATIVE
Leukocytes, UA: NEGATIVE
Nitrite, UA: NEGATIVE
Protein, UA: NEGATIVE
Spec Grav, UA: 1.02
Urobilinogen, UA: 0.2
pH, UA: 7

## 2016-02-04 LAB — CBC WITH DIFFERENTIAL/PLATELET
Basophils Absolute: 0 10*3/uL (ref 0.0–0.1)
Basophils Relative: 0.7 % (ref 0.0–3.0)
Eosinophils Absolute: 0.3 10*3/uL (ref 0.0–0.7)
Eosinophils Relative: 8.7 % — ABNORMAL HIGH (ref 0.0–5.0)
HCT: 42.9 % (ref 39.0–52.0)
Hemoglobin: 13.8 g/dL (ref 13.0–17.0)
Lymphocytes Relative: 33.3 % (ref 12.0–46.0)
Lymphs Abs: 1.3 10*3/uL (ref 0.7–4.0)
MCHC: 32.2 g/dL (ref 30.0–36.0)
MCV: 82.7 fl (ref 78.0–100.0)
Monocytes Absolute: 0.4 10*3/uL (ref 0.1–1.0)
Monocytes Relative: 11 % (ref 3.0–12.0)
Neutro Abs: 1.7 10*3/uL (ref 1.4–7.7)
Neutrophils Relative %: 46.3 % (ref 43.0–77.0)
Platelets: 219 10*3/uL (ref 150.0–400.0)
RBC: 5.19 Mil/uL (ref 4.22–5.81)
RDW: 14.1 % (ref 11.5–15.5)
WBC: 3.8 10*3/uL — ABNORMAL LOW (ref 4.0–10.5)

## 2016-02-04 LAB — LIPID PANEL
Cholesterol: 259 mg/dL — ABNORMAL HIGH (ref 0–200)
HDL: 72.5 mg/dL (ref >39.00)
LDL Cholesterol: 165 mg/dL — ABNORMAL HIGH (ref 0–99)
NonHDL: 186.79
Total CHOL/HDL Ratio: 4
Triglycerides: 109 mg/dL (ref 0.0–149.0)
VLDL: 21.8 mg/dL (ref 0.0–40.0)

## 2016-02-04 LAB — HEPATIC FUNCTION PANEL
ALT: 14 U/L (ref 0–53)
AST: 18 U/L (ref 0–37)
Albumin: 4.1 g/dL (ref 3.5–5.2)
Alkaline Phosphatase: 47 U/L (ref 39–117)
Bilirubin, Direct: 0.1 mg/dL (ref 0.0–0.3)
Total Bilirubin: 0.5 mg/dL (ref 0.2–1.2)
Total Protein: 6.6 g/dL (ref 6.0–8.3)

## 2016-02-04 LAB — TSH: TSH: 1.28 u[IU]/mL (ref 0.35–4.50)

## 2016-02-04 LAB — PSA: PSA: 1.02 ng/mL (ref 0.10–4.00)

## 2016-02-11 ENCOUNTER — Ambulatory Visit (INDEPENDENT_AMBULATORY_CARE_PROVIDER_SITE_OTHER): Payer: Medicare Other | Admitting: Family Medicine

## 2016-02-11 ENCOUNTER — Encounter: Payer: Self-pay | Admitting: Family Medicine

## 2016-02-11 VITALS — BP 159/79 | HR 53 | Temp 98.1°F | Ht 73.0 in | Wt 200.0 lb

## 2016-02-11 DIAGNOSIS — Z23 Encounter for immunization: Secondary | ICD-10-CM

## 2016-02-11 DIAGNOSIS — E785 Hyperlipidemia, unspecified: Secondary | ICD-10-CM

## 2016-02-11 DIAGNOSIS — Z0000 Encounter for general adult medical examination without abnormal findings: Secondary | ICD-10-CM | POA: Diagnosis not present

## 2016-02-11 MED ORDER — DILTIAZEM HCL ER COATED BEADS 300 MG PO CP24: 300.0000 mg | ORAL_CAPSULE | Freq: Every day | ORAL | Status: DC

## 2016-02-11 MED ORDER — SILDENAFIL CITRATE 100 MG PO TABS: ORAL_TABLET | ORAL | Status: DC

## 2016-02-11 MED ORDER — TERAZOSIN HCL 2 MG PO CAPS: ORAL_CAPSULE | ORAL | Status: DC

## 2016-02-11 NOTE — Progress Notes (Signed)
   Subjective:    Patient ID: [name] [name], male    DOB: 1935-05-12, 81 y.o.   MRN: [id number]  HPI 81 yr old male for a cpx. He feels well. His BP is stable at home.    Review of Systems  Constitutional: Negative.   HENT: Negative.   Eyes: Negative.   Respiratory: Negative.   Cardiovascular: Negative.   Gastrointestinal: Negative.   Genitourinary: Negative.   Musculoskeletal: Negative.   Skin: Negative.   Neurological: Negative.   Psychiatric/Behavioral: Negative.        Objective:   Physical Exam  Constitutional: He is oriented to person, place, and time. He appears well-developed and well-nourished. No distress.  HENT:  Head: Normocephalic and atraumatic.  Right Ear: External ear normal.  Left Ear: External ear normal.  Nose: Nose normal.  Mouth/Throat: Oropharynx is clear and moist. No oropharyngeal exudate.  Eyes: Conjunctivae and EOM are normal. Pupils are equal, round, and reactive to light. Right eye exhibits no discharge. Left eye exhibits no discharge. No scleral icterus.  Neck: Neck supple. No JVD present. No tracheal deviation present. No thyromegaly present.  Cardiovascular: Normal rate, regular rhythm, normal heart sounds and intact distal pulses.  Exam reveals no gallop and no friction rub.   No murmur heard. EKG shows stable RBBB  Pulmonary/Chest: Effort normal and breath sounds normal. No respiratory distress. He has no wheezes. He has no rales. He exhibits no tenderness.  Abdominal: Soft. Bowel sounds are normal. He exhibits no distension and no mass. There is no tenderness. There is no rebound and no guarding.  Genitourinary: Rectum normal, prostate normal and penis normal. Guaiac negative stool. No penile tenderness.  Musculoskeletal: Normal range of motion. He exhibits no edema or tenderness.  Lymphadenopathy:    He has no cervical adenopathy.  Neurological: He is alert and oriented to person, place, and time. He has normal reflexes. No cranial nerve  deficit. He exhibits normal muscle tone. Coordination normal.  Skin: Skin is warm and dry. No rash noted. He is not diaphoretic. No erythema. No pallor.  Psychiatric: He has a normal mood and affect. His behavior is normal. Judgment and thought content normal.          Assessment & Plan:  Well exam. We discussed diet and exercise.

## 2016-02-11 NOTE — Addendum Note (Signed)
Addended by: Aniceto Boss A on: 02/11/2016 10:12 AM   Modules accepted: Orders

## 2016-02-17 ENCOUNTER — Telehealth: Payer: Self-pay | Admitting: General Practice

## 2016-02-17 NOTE — Telephone Encounter (Signed)
Received a Fax from [employer] advising that patient's Viagra was approved from 01/17/16 until 02/15/2017. Patient made aware and advised to follow up with his pharmacy.

## 2016-10-12 ENCOUNTER — Ambulatory Visit (INDEPENDENT_AMBULATORY_CARE_PROVIDER_SITE_OTHER): Payer: Medicare Other

## 2016-10-12 DIAGNOSIS — Z23 Encounter for immunization: Secondary | ICD-10-CM

## 2017-02-09 ENCOUNTER — Other Ambulatory Visit (INDEPENDENT_AMBULATORY_CARE_PROVIDER_SITE_OTHER): Payer: Medicare Other

## 2017-02-09 DIAGNOSIS — Z0000 Encounter for general adult medical examination without abnormal findings: Secondary | ICD-10-CM | POA: Diagnosis not present

## 2017-02-09 LAB — BASIC METABOLIC PANEL
BUN: 10 mg/dL (ref 6–23)
CO2: 29 mEq/L (ref 19–32)
Calcium: 9.1 mg/dL (ref 8.4–10.5)
Chloride: 103 mEq/L (ref 96–112)
Creatinine, Ser: 0.92 mg/dL (ref 0.40–1.50)
GFR: 101.95 mL/min (ref >60.00)
Glucose, Bld: 110 mg/dL — ABNORMAL HIGH (ref 70–99)
Potassium: 4.6 mEq/L (ref 3.5–5.1)
Sodium: 138 mEq/L (ref 135–145)

## 2017-02-09 LAB — HEPATIC FUNCTION PANEL
ALT: 18 U/L (ref 0–53)
AST: 21 U/L (ref 0–37)
Albumin: 4 g/dL (ref 3.5–5.2)
Alkaline Phosphatase: 43 U/L (ref 39–117)
Bilirubin, Direct: 0.1 mg/dL (ref 0.0–0.3)
Total Bilirubin: 0.6 mg/dL (ref 0.2–1.2)
Total Protein: 6.6 g/dL (ref 6.0–8.3)

## 2017-02-09 LAB — TSH: TSH: 0.94 u[IU]/mL (ref 0.35–4.50)

## 2017-02-09 LAB — CBC WITH DIFFERENTIAL/PLATELET
Basophils Absolute: 0 10*3/uL (ref 0.0–0.1)
Basophils Relative: 0.7 % (ref 0.0–3.0)
Eosinophils Absolute: 0.3 10*3/uL (ref 0.0–0.7)
Eosinophils Relative: 6.4 % — ABNORMAL HIGH (ref 0.0–5.0)
HCT: 40.4 % (ref 39.0–52.0)
Hemoglobin: 13.4 g/dL (ref 13.0–17.0)
Lymphocytes Relative: 21.5 % (ref 12.0–46.0)
Lymphs Abs: 0.9 10*3/uL (ref 0.7–4.0)
MCHC: 33.2 g/dL (ref 30.0–36.0)
MCV: 80.9 fl (ref 78.0–100.0)
Monocytes Absolute: 0.5 10*3/uL (ref 0.1–1.0)
Monocytes Relative: 11.4 % (ref 3.0–12.0)
Neutro Abs: 2.6 10*3/uL (ref 1.4–7.7)
Neutrophils Relative %: 60 % (ref 43.0–77.0)
Platelets: 331 10*3/uL (ref 150.0–400.0)
RBC: 4.99 Mil/uL (ref 4.22–5.81)
RDW: 13.7 % (ref 11.5–15.5)
WBC: 4.4 10*3/uL (ref 4.0–10.5)

## 2017-02-09 LAB — LIPID PANEL
Cholesterol: 196 mg/dL (ref 0–200)
HDL: 53.1 mg/dL (ref >39.00)
LDL Cholesterol: 122 mg/dL — ABNORMAL HIGH (ref 0–99)
NonHDL: 143.32
Total CHOL/HDL Ratio: 4
Triglycerides: 107 mg/dL (ref 0.0–149.0)
VLDL: 21.4 mg/dL (ref 0.0–40.0)

## 2017-02-09 LAB — POC URINALSYSI DIPSTICK (AUTOMATED)
Bilirubin, UA: NEGATIVE
Blood, UA: NEGATIVE
Glucose, UA: NEGATIVE
Ketones, UA: NEGATIVE
Leukocytes, UA: NEGATIVE
Nitrite, UA: NEGATIVE
Spec Grav, UA: 1.02
Urobilinogen, UA: 0.2
pH, UA: 7

## 2017-02-09 LAB — PSA: PSA: 0.85 ng/mL (ref 0.10–4.00)

## 2017-02-14 ENCOUNTER — Ambulatory Visit (INDEPENDENT_AMBULATORY_CARE_PROVIDER_SITE_OTHER): Payer: Medicare Other | Admitting: Family Medicine

## 2017-02-14 ENCOUNTER — Encounter: Payer: Self-pay | Admitting: Family Medicine

## 2017-02-14 VITALS — BP 168/84 | HR 70 | Temp 98.2°F | Ht 72.5 in | Wt 199.8 lb

## 2017-02-14 DIAGNOSIS — Z0000 Encounter for general adult medical examination without abnormal findings: Secondary | ICD-10-CM | POA: Diagnosis not present

## 2017-02-14 MED ORDER — TERAZOSIN HCL 2 MG PO CAPS: ORAL_CAPSULE | ORAL | 3 refills | Status: DC

## 2017-02-14 MED ORDER — DILTIAZEM HCL ER COATED BEADS 300 MG PO CP24: 300.0000 mg | ORAL_CAPSULE | Freq: Every day | ORAL | 3 refills | Status: DC

## 2017-02-14 MED ORDER — SILDENAFIL CITRATE 100 MG PO TABS: ORAL_TABLET | ORAL | 3 refills | Status: DC

## 2017-02-14 NOTE — Progress Notes (Signed)
Pre visit review using our clinic review tool, if applicable. No additional management support is needed unless otherwise documented below in the visit note. 

## 2017-02-14 NOTE — Progress Notes (Signed)
   Subjective:    Patient ID: [name] [name], male    DOB: 1935-03-14, 82 y.o.   MRN: [id number]  HPI 82 yr old male for a well exam. He feels well.    Review of Systems  Constitutional: Negative.   HENT: Negative.   Eyes: Negative.   Respiratory: Negative.   Cardiovascular: Negative.   Gastrointestinal: Negative.   Genitourinary: Negative.   Musculoskeletal: Negative.   Skin: Negative.   Neurological: Negative.   Psychiatric/Behavioral: Negative.        Objective:   Physical Exam  Constitutional: He is oriented to person, place, and time. He appears well-developed and well-nourished. No distress.  HENT:  Head: Normocephalic and atraumatic.  Right Ear: External ear normal.  Left Ear: External ear normal.  Nose: Nose normal.  Mouth/Throat: Oropharynx is clear and moist. No oropharyngeal exudate.  Eyes: Conjunctivae and EOM are normal. Pupils are equal, round, and reactive to light. Right eye exhibits no discharge. Left eye exhibits no discharge. No scleral icterus.  Neck: Neck supple. No JVD present. No tracheal deviation present. No thyromegaly present.  Cardiovascular: Normal rate, regular rhythm, normal heart sounds and intact distal pulses.  Exam reveals no gallop and no friction rub.   No murmur heard. Pulmonary/Chest: Effort normal and breath sounds normal. No respiratory distress. He has no wheezes. He has no rales. He exhibits no tenderness.  Abdominal: Soft. Bowel sounds are normal. He exhibits no distension and no mass. There is no tenderness. There is no rebound and no guarding.  Genitourinary: Rectum normal, prostate normal and penis normal. Rectal exam shows guaiac negative stool. No penile tenderness.  Musculoskeletal: Normal range of motion. He exhibits no edema or tenderness.  Lymphadenopathy:    He has no cervical adenopathy.  Neurological: He is alert and oriented to person, place, and time. He has normal reflexes. No cranial nerve deficit. He exhibits  normal muscle tone. Coordination normal.  Skin: Skin is warm and dry. No rash noted. He is not diaphoretic. No erythema. No pallor.  Psychiatric: He has a normal mood and affect. His behavior is normal. Judgment and thought content normal.          Assessment & Plan:  Well exam. We discussed diet and exercise.  Gershon CraneStephen Lilian Fuhs, MD

## 2017-10-06 ENCOUNTER — Ambulatory Visit (INDEPENDENT_AMBULATORY_CARE_PROVIDER_SITE_OTHER): Payer: Medicare Other | Admitting: *Deleted

## 2017-10-06 DIAGNOSIS — Z23 Encounter for immunization: Secondary | ICD-10-CM

## 2018-01-24 ENCOUNTER — Encounter (HOSPITAL_COMMUNITY): Payer: Self-pay

## 2018-01-24 ENCOUNTER — Other Ambulatory Visit: Payer: Self-pay

## 2018-01-24 DIAGNOSIS — Z041 Encounter for examination and observation following transport accident: Secondary | ICD-10-CM | POA: Diagnosis present

## 2018-01-24 DIAGNOSIS — I10 Essential (primary) hypertension: Secondary | ICD-10-CM | POA: Insufficient documentation

## 2018-01-24 DIAGNOSIS — Y939 Activity, unspecified: Secondary | ICD-10-CM | POA: Diagnosis not present

## 2018-01-24 DIAGNOSIS — Y999 Unspecified external cause status: Secondary | ICD-10-CM | POA: Diagnosis not present

## 2018-01-24 DIAGNOSIS — Z87891 Personal history of nicotine dependence: Secondary | ICD-10-CM | POA: Diagnosis not present

## 2018-01-24 DIAGNOSIS — S20211A Contusion of right front wall of thorax, initial encounter: Secondary | ICD-10-CM | POA: Diagnosis not present

## 2018-01-24 DIAGNOSIS — R0789 Other chest pain: Secondary | ICD-10-CM | POA: Insufficient documentation

## 2018-01-24 DIAGNOSIS — Z79899 Other long term (current) drug therapy: Secondary | ICD-10-CM | POA: Insufficient documentation

## 2018-01-24 DIAGNOSIS — Y929 Unspecified place or not applicable: Secondary | ICD-10-CM | POA: Diagnosis not present
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
## 2018-01-25 ENCOUNTER — Emergency Department (HOSPITAL_COMMUNITY): Payer: Medicare Other

## 2018-01-25 ENCOUNTER — Emergency Department (HOSPITAL_COMMUNITY)
Admission: EM | Admit: 2018-01-25 | Discharge: 2018-01-25 | Disposition: A | Discharge status: Home / Self Care | Payer: Medicare Other | Attending: Emergency Medicine | Admitting: Emergency Medicine

## 2018-01-25 DIAGNOSIS — S20211A Contusion of right front wall of thorax, initial encounter: Secondary | ICD-10-CM

## 2018-01-25 MED ORDER — METHOCARBAMOL 500 MG PO TABS
500.0000 mg | ORAL_TABLET | Freq: Once | ORAL | Status: AC
  Administered 2018-01-25: 500 mg via ORAL
  Filled 2018-01-25: qty 1

## 2018-01-25 MED ORDER — METHOCARBAMOL 500 MG PO TABS: 500.0000 mg | ORAL_TABLET | Freq: Two times a day (BID) | ORAL | 0 refills | Status: DC | PRN

## 2018-01-25 MED ORDER — IBUPROFEN 200 MG PO TABS
400.0000 mg | ORAL_TABLET | Freq: Once | ORAL | Status: AC
  Administered 2018-01-25: 400 mg via ORAL
  Filled 2018-01-25: qty 2
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
## 2018-01-25 NOTE — Discharge Instructions (Signed)
Ice packs to the injured or sore muscles for the next several days then start using heat. Take the medications for muscle pain with ibuprofen 400 mg 3 times a day as needed. Return to the ED for any problems listed on the head injury sheet. Recheck if you aren't improving in the next week, or if get you short of breath, cough or fever. .Marland Kitchen

## 2018-02-08 ENCOUNTER — Other Ambulatory Visit: Payer: Self-pay | Admitting: Family Medicine

## 2018-02-20 ENCOUNTER — Ambulatory Visit (INDEPENDENT_AMBULATORY_CARE_PROVIDER_SITE_OTHER): Payer: Medicare Other | Admitting: Family Medicine

## 2018-02-20 ENCOUNTER — Encounter: Payer: Self-pay | Admitting: Family Medicine

## 2018-02-20 VITALS — BP 140/76 | HR 60 | Temp 98.1°F | Ht 71.0 in | Wt 194.2 lb

## 2018-02-20 DIAGNOSIS — I10 Essential (primary) hypertension: Secondary | ICD-10-CM | POA: Diagnosis not present

## 2018-02-20 DIAGNOSIS — N138 Other obstructive and reflux uropathy: Secondary | ICD-10-CM

## 2018-02-20 DIAGNOSIS — E785 Hyperlipidemia, unspecified: Secondary | ICD-10-CM

## 2018-02-20 DIAGNOSIS — N401 Enlarged prostate with lower urinary tract symptoms: Secondary | ICD-10-CM

## 2018-02-20 LAB — LIPID PANEL
Cholesterol: 226 mg/dL — ABNORMAL HIGH (ref 0–200)
HDL: 71.5 mg/dL (ref >39.00)
LDL Cholesterol: 140 mg/dL — ABNORMAL HIGH (ref 0–99)
NonHDL: 154.89
Total CHOL/HDL Ratio: 3
Triglycerides: 73 mg/dL (ref 0.0–149.0)
VLDL: 14.6 mg/dL (ref 0.0–40.0)

## 2018-02-20 LAB — BASIC METABOLIC PANEL
BUN: 13 mg/dL (ref 6–23)
CO2: 31 mEq/L (ref 19–32)
Calcium: 9.7 mg/dL (ref 8.4–10.5)
Chloride: 104 mEq/L (ref 96–112)
Creatinine, Ser: 0.96 mg/dL (ref 0.40–1.50)
GFR: 96.81 mL/min (ref >60.00)
Glucose, Bld: 110 mg/dL — ABNORMAL HIGH (ref 70–99)
Potassium: 4.6 mEq/L (ref 3.5–5.1)
Sodium: 140 mEq/L (ref 135–145)

## 2018-02-20 LAB — POC URINALSYSI DIPSTICK (AUTOMATED)
Bilirubin, UA: NEGATIVE
Blood, UA: NEGATIVE
Glucose, UA: NEGATIVE
Ketones, UA: NEGATIVE
Leukocytes, UA: NEGATIVE
Nitrite, UA: NEGATIVE
Protein, UA: NEGATIVE
Spec Grav, UA: 1.015 (ref 1.010–1.025)
Urobilinogen, UA: 0.2 E.U./dL
pH, UA: 7.5 (ref 5.0–8.0)

## 2018-02-20 LAB — CBC WITH DIFFERENTIAL/PLATELET
Basophils Absolute: 0 10*3/uL (ref 0.0–0.1)
Basophils Relative: 1.1 % (ref 0.0–3.0)
Eosinophils Absolute: 0.3 10*3/uL (ref 0.0–0.7)
Eosinophils Relative: 7.7 % — ABNORMAL HIGH (ref 0.0–5.0)
HCT: 41.3 % (ref 39.0–52.0)
Hemoglobin: 13.5 g/dL (ref 13.0–17.0)
Lymphocytes Relative: 32.6 % (ref 12.0–46.0)
Lymphs Abs: 1.1 10*3/uL (ref 0.7–4.0)
MCHC: 32.8 g/dL (ref 30.0–36.0)
MCV: 82 fl (ref 78.0–100.0)
Monocytes Absolute: 0.4 10*3/uL (ref 0.1–1.0)
Monocytes Relative: 11.3 % (ref 3.0–12.0)
Neutro Abs: 1.5 10*3/uL (ref 1.4–7.7)
Neutrophils Relative %: 47.3 % (ref 43.0–77.0)
Platelets: 217 10*3/uL (ref 150.0–400.0)
RBC: 5.04 Mil/uL (ref 4.22–5.81)
RDW: 14.3 % (ref 11.5–15.5)
WBC: 3.3 10*3/uL — ABNORMAL LOW (ref 4.0–10.5)

## 2018-02-20 LAB — HEPATIC FUNCTION PANEL
ALT: 15 U/L (ref 0–53)
AST: 18 U/L (ref 0–37)
Albumin: 4.1 g/dL (ref 3.5–5.2)
Alkaline Phosphatase: 44 U/L (ref 39–117)
Bilirubin, Direct: 0.1 mg/dL (ref 0.0–0.3)
Total Bilirubin: 0.5 mg/dL (ref 0.2–1.2)
Total Protein: 6.5 g/dL (ref 6.0–8.3)

## 2018-02-20 LAB — PSA: PSA: 1.23 ng/mL (ref 0.10–4.00)

## 2018-02-20 LAB — TSH: TSH: 1.99 u[IU]/mL (ref 0.35–4.50)
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
## 2018-03-15 ENCOUNTER — Ambulatory Visit (INDEPENDENT_AMBULATORY_CARE_PROVIDER_SITE_OTHER)
Admission: RE | Admit: 2018-03-15 | Discharge: 2018-03-15 | Disposition: A | Discharge status: Home / Self Care | Payer: Medicare Other | Source: Ambulatory Visit | Attending: Family Medicine | Admitting: Family Medicine

## 2018-03-15 ENCOUNTER — Ambulatory Visit (INDEPENDENT_AMBULATORY_CARE_PROVIDER_SITE_OTHER): Payer: Medicare Other | Admitting: Family Medicine

## 2018-03-15 ENCOUNTER — Encounter: Payer: Self-pay | Admitting: Family Medicine

## 2018-03-15 VITALS — BP 142/80 | HR 44 | Temp 97.6°F | Ht 71.0 in | Wt 190.8 lb

## 2018-03-15 DIAGNOSIS — M542 Cervicalgia: Secondary | ICD-10-CM

## 2018-03-15 MED ORDER — CYCLOBENZAPRINE HCL 10 MG PO TABS: 10.0000 mg | ORAL_TABLET | Freq: Three times a day (TID) | ORAL | 1 refills | Status: DC | PRN

## 2018-03-15 MED ORDER — METHYLPREDNISOLONE 4 MG PO TBPK: ORAL_TABLET | ORAL | 0 refills | Status: DC
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
## 2018-09-24 ENCOUNTER — Ambulatory Visit (INDEPENDENT_AMBULATORY_CARE_PROVIDER_SITE_OTHER): Payer: Medicare Other | Admitting: *Deleted

## 2018-09-24 DIAGNOSIS — Z23 Encounter for immunization: Secondary | ICD-10-CM

## 2018-09-24 NOTE — Progress Notes (Signed)
Per orders of Dr. Fry, injection of influenza given by Porcia Morganti. Patient tolerated injection well. 

## 2018-10-08 ENCOUNTER — Ambulatory Visit: Payer: Medicare Other

## 2019-02-02 ENCOUNTER — Other Ambulatory Visit: Payer: Self-pay | Admitting: Internal Medicine

## 2019-02-06 NOTE — Telephone Encounter (Signed)
Requested medication (s) are due for refill today -yes  Requested medication (s) are on the active medication list -yes  Future visit scheduled -yes  Last refill: Both medications: 02/09/18 with 1 year refills  Notes to clinic: Patient has up coming appointment 02/22/19- needs extension of requested medications until appointment- need to be reviewed by another provider- as original Rx written by Dr Amador Cunas  Requested Prescriptions  Pending Prescriptions Disp Refills   CARTIA XT 300 MG 24 hr capsule [Pharmacy Med Name: DILTIAZEM HCL ER(CARTIA XT)CP 300MG ] 90 capsule 4    Sig: TAKE 1 CAPSULE DAILY     Cardiovascular:  Calcium Channel Blockers Failed - 02/06/2019  9:07 AM      Failed - Last BP in normal range    BP Readings from Last 1 Encounters:  03/15/18 (!) 142/80         Failed - Valid encounter within last 6 months    Recent Outpatient Visits          10 months ago Neck pain   [facility], Tera Mater, MD   11 months ago Essential hypertension   [facility], Tera Mater, MD   1 year ago Preventative health care   [facility] [place], Tera Mater, MD   2 years ago Preventative health care   [facility] [place], Tera Mater, MD   3 years ago Viral illness   [facility], Tera Mater, MD      Future Appointments            In 2 weeks [name], MD [facility] [place], [facility]          terazosin (HYTRIN) 2 MG capsule [Pharmacy Med Name: TERAZOSIN CAPS 2MG ] 180 capsule 8    Sig: TAKE 2 CAPSULES AT BEDTIME     Cardiovascular:  Alpha Blockers Failed - 02/06/2019  9:07 AM      Failed - Last BP in normal range    BP Readings from Last 1 Encounters:  03/15/18 (!) 142/80         Failed - Valid encounter within last 6 months    Recent Outpatient Visits          10 months ago Neck pain   [facility] [place], Tera Mater, MD   11 months ago  Essential hypertension   [facility], Tera Mater, MD   1 year ago Preventative health care   [facility] [place], Tera Mater, MD   2 years ago Preventative health care   [facility] [place], Tera Mater, MD   3 years ago Viral illness   [facility], Tera Mater, MD      Future Appointments            In 2 weeks [name], MD [facility] [place], [facility]            Requested Prescriptions  Pending Prescriptions Disp Refills   CARTIA XT 300 MG 24 hr capsule [Pharmacy Med Name: DILTIAZEM HCL ER(CARTIA XT)CP 300MG ] 90 capsule 4    Sig: TAKE 1 CAPSULE DAILY     Cardiovascular:  Calcium Channel Blockers Failed - 02/06/2019  9:07 AM      Failed - Last BP in normal range    BP Readings from Last 1 Encounters:  03/15/18 (!) 142/80  Failed - Valid encounter within last 6 months    Recent Outpatient Visits          10 months ago Neck pain   [facility], Tera Mater, MD   11 months ago Essential hypertension   [facility], Tera Mater, MD   1 year ago Preventative health care   [facility] [place], Tera Mater, MD   2 years ago Preventative health care   [facility] [place], Tera Mater, MD   3 years ago Viral illness   [facility], Tera Mater, MD      Future Appointments            In 2 weeks [name], MD [facility] [place], [facility]          terazosin (HYTRIN) 2 MG capsule [Pharmacy Med Name: TERAZOSIN CAPS 2MG ] 180 capsule 8    Sig: TAKE 2 CAPSULES AT BEDTIME     Cardiovascular:  Alpha Blockers Failed - 02/06/2019  9:07 AM      Failed - Last BP in normal range    BP Readings from Last 1 Encounters:  03/15/18 (!) 142/80         Failed - Valid encounter within last 6 months    Recent Outpatient Visits          10 months ago Neck pain   [facility], Tera Mater, MD   11 months ago Essential hypertension   [facility], Tera Mater, MD   1 year ago Preventative health care   [facility] [place], Tera Mater, MD   2 years ago Preventative health care   [facility] [place], Tera Mater, MD   3 years ago Viral illness   [facility], Tera Mater, MD      Future Appointments            In 2 weeks [name], MD [facility] [place], [facility]

## 2019-02-06 NOTE — Telephone Encounter (Signed)
Patient is checking on the status of the refill.  Please advise.

## 2019-02-22 ENCOUNTER — Ambulatory Visit (INDEPENDENT_AMBULATORY_CARE_PROVIDER_SITE_OTHER): Payer: Medicare Other | Admitting: Family Medicine

## 2019-02-22 ENCOUNTER — Encounter: Payer: Self-pay | Admitting: Family Medicine

## 2019-02-22 VITALS — BP 142/74 | HR 57 | Temp 97.7°F | Ht 72.0 in | Wt 189.2 lb

## 2019-02-22 DIAGNOSIS — R739 Hyperglycemia, unspecified: Secondary | ICD-10-CM

## 2019-02-22 DIAGNOSIS — Z0000 Encounter for general adult medical examination without abnormal findings: Secondary | ICD-10-CM

## 2019-02-22 LAB — HEPATIC FUNCTION PANEL
ALT: 17 U/L (ref 0–53)
AST: 24 U/L (ref 0–37)
Albumin: 4.6 g/dL (ref 3.5–5.2)
Alkaline Phosphatase: 48 U/L (ref 39–117)
Bilirubin, Direct: 0.1 mg/dL (ref 0.0–0.3)
Total Bilirubin: 0.6 mg/dL (ref 0.2–1.2)
Total Protein: 6.8 g/dL (ref 6.0–8.3)

## 2019-02-22 LAB — POC URINALSYSI DIPSTICK (AUTOMATED)
Bilirubin, UA: NEGATIVE
Blood, UA: NEGATIVE
Glucose, UA: NEGATIVE
Leukocytes, UA: NEGATIVE
Nitrite, UA: NEGATIVE
Protein, UA: NEGATIVE
Spec Grav, UA: 1.025 (ref 1.010–1.025)
Urobilinogen, UA: 0.2 E.U./dL
pH, UA: 6 (ref 5.0–8.0)

## 2019-02-22 LAB — BASIC METABOLIC PANEL
BUN: 13 mg/dL (ref 6–23)
CO2: 26 mEq/L (ref 19–32)
Calcium: 9.5 mg/dL (ref 8.4–10.5)
Chloride: 107 mEq/L (ref 96–112)
Creatinine, Ser: 1.06 mg/dL (ref 0.40–1.50)
GFR: 81.04 mL/min (ref >60.00)
Glucose, Bld: 97 mg/dL (ref 70–99)
Potassium: 4.4 mEq/L (ref 3.5–5.1)
Sodium: 141 mEq/L (ref 135–145)

## 2019-02-22 LAB — CBC WITH DIFFERENTIAL/PLATELET
Basophils Absolute: 0 10*3/uL (ref 0.0–0.1)
Basophils Relative: 0.9 % (ref 0.0–3.0)
Eosinophils Absolute: 0.2 10*3/uL (ref 0.0–0.7)
Eosinophils Relative: 5.9 % — ABNORMAL HIGH (ref 0.0–5.0)
HCT: 43.1 % (ref 39.0–52.0)
Hemoglobin: 14.3 g/dL (ref 13.0–17.0)
Lymphocytes Relative: 32.2 % (ref 12.0–46.0)
Lymphs Abs: 1 10*3/uL (ref 0.7–4.0)
MCHC: 33.2 g/dL (ref 30.0–36.0)
MCV: 81.9 fl (ref 78.0–100.0)
Monocytes Absolute: 0.4 10*3/uL (ref 0.1–1.0)
Monocytes Relative: 11.4 % (ref 3.0–12.0)
Neutro Abs: 1.6 10*3/uL (ref 1.4–7.7)
Neutrophils Relative %: 49.6 % (ref 43.0–77.0)
Platelets: 207 10*3/uL (ref 150.0–400.0)
RBC: 5.27 Mil/uL (ref 4.22–5.81)
RDW: 14.2 % (ref 11.5–15.5)
WBC: 3.1 10*3/uL — ABNORMAL LOW (ref 4.0–10.5)

## 2019-02-22 LAB — TSH: TSH: 1.55 u[IU]/mL (ref 0.35–4.50)

## 2019-02-22 LAB — LIPID PANEL
Cholesterol: 219 mg/dL — ABNORMAL HIGH (ref 0–200)
HDL: 75.1 mg/dL (ref >39.00)
LDL Cholesterol: 130 mg/dL — ABNORMAL HIGH (ref 0–99)
NonHDL: 143.97
Total CHOL/HDL Ratio: 3
Triglycerides: 70 mg/dL (ref 0.0–149.0)
VLDL: 14 mg/dL (ref 0.0–40.0)

## 2019-02-22 LAB — PSA: PSA: 1.37 ng/mL (ref 0.10–4.00)

## 2019-02-22 LAB — HEMOGLOBIN A1C: Hgb A1c MFr Bld: 5.6 % (ref 4.6–6.5)

## 2019-02-22 NOTE — Progress Notes (Signed)
   Subjective:    Patient ID: [name] [name], male    DOB: 12-19-1935, 84 y.o.   MRN: [id number]  HPI Here for a well exam. He feels fine and has no concerns.    Review of Systems  Constitutional: Negative.   HENT: Negative.   Eyes: Negative.   Respiratory: Negative.   Cardiovascular: Negative.   Gastrointestinal: Negative.   Genitourinary: Negative.   Musculoskeletal: Negative.   Skin: Negative.   Neurological: Negative.   Psychiatric/Behavioral: Negative.        Objective:   Physical Exam Constitutional:      General: He is not in acute distress.    Appearance: He is well-developed. He is not diaphoretic.  HENT:     Head: Normocephalic and atraumatic.     Right Ear: External ear normal.     Left Ear: External ear normal.     Nose: Nose normal.     Mouth/Throat:     Pharynx: No oropharyngeal exudate.  Eyes:     General: No scleral icterus.       Right eye: No discharge.        Left eye: No discharge.     Conjunctiva/sclera: Conjunctivae normal.     Pupils: Pupils are equal, round, and reactive to light.  Neck:     Musculoskeletal: Neck supple.     Thyroid: No thyromegaly.     Vascular: No JVD.     Trachea: No tracheal deviation.  Cardiovascular:     Rate and Rhythm: Normal rate and regular rhythm.     Heart sounds: Normal heart sounds. No murmur. No friction rub. No gallop.   Pulmonary:     Effort: Pulmonary effort is normal. No respiratory distress.     Breath sounds: Normal breath sounds. No wheezing or rales.  Chest:     Chest wall: No tenderness.  Abdominal:     General: Bowel sounds are normal. There is no distension.     Palpations: Abdomen is soft. There is no mass.     Tenderness: There is no abdominal tenderness. There is no guarding or rebound.  Genitourinary:    Penis: Normal. No tenderness.      Prostate: Normal.     Rectum: Normal. Guaiac result negative.  Musculoskeletal: Normal range of motion.        General: No tenderness.    Lymphadenopathy:     Cervical: No cervical adenopathy.  Skin:    General: Skin is warm and dry.     Coloration: Skin is not pale.     Findings: No erythema or rash.  Neurological:     Mental Status: He is alert and oriented to person, place, and time.     Cranial Nerves: No cranial nerve deficit.     Motor: No abnormal muscle tone.     Coordination: Coordination normal.     Deep Tendon Reflexes: Reflexes are normal and symmetric. Reflexes normal.  Psychiatric:        Behavior: Behavior normal.        Thought Content: Thought content normal.        Judgment: Judgment normal.           Assessment & Plan:  Well exam. We dicussed diet and exercise. Get fasting labs.  Gershon Crane, MD

## 2019-04-29 ENCOUNTER — Other Ambulatory Visit: Payer: Self-pay | Admitting: Family Medicine

## 2019-09-11 ENCOUNTER — Telehealth: Payer: Self-pay | Admitting: Family Medicine

## 2019-09-11 NOTE — Telephone Encounter (Signed)
Patient would like to know when he had his  flu shot last year, please advise

## 2019-09-11 NOTE — Telephone Encounter (Signed)
Per chart pt received High-Dose flu vaccine 09/24/18.  [facility] may give information to pt/spouse. 

## 2019-09-12 ENCOUNTER — Other Ambulatory Visit: Payer: Self-pay

## 2019-09-12 ENCOUNTER — Encounter (INDEPENDENT_AMBULATORY_CARE_PROVIDER_SITE_OTHER): Payer: Medicare Other | Admitting: Ophthalmology

## 2019-09-12 DIAGNOSIS — H43813 Vitreous degeneration, bilateral: Secondary | ICD-10-CM | POA: Diagnosis not present

## 2019-09-12 DIAGNOSIS — H4423 Degenerative myopia, bilateral: Secondary | ICD-10-CM

## 2019-09-12 DIAGNOSIS — I10 Essential (primary) hypertension: Secondary | ICD-10-CM | POA: Diagnosis not present

## 2019-09-12 DIAGNOSIS — H35033 Hypertensive retinopathy, bilateral: Secondary | ICD-10-CM | POA: Diagnosis not present

## 2019-09-13 NOTE — Telephone Encounter (Signed)
Pt scheduled for flu shot 

## 2019-09-25 ENCOUNTER — Ambulatory Visit (INDEPENDENT_AMBULATORY_CARE_PROVIDER_SITE_OTHER): Payer: Medicare Other | Admitting: *Deleted

## 2019-09-25 ENCOUNTER — Other Ambulatory Visit: Payer: Self-pay

## 2019-09-25 DIAGNOSIS — Z23 Encounter for immunization: Secondary | ICD-10-CM | POA: Diagnosis not present

## 2019-09-25 NOTE — Progress Notes (Signed)
Per orders of Dr. Fry, injection of Influenza Vaccine given by Maxamilian Amadon S Sheyann Sulton. Patient tolerated injection well.  

## 2019-11-06 ENCOUNTER — Other Ambulatory Visit: Payer: Self-pay

## 2019-11-06 ENCOUNTER — Emergency Department (HOSPITAL_COMMUNITY)
Admission: EM | Admit: 2019-11-06 | Discharge: 2019-11-06 | Disposition: A | Discharge status: Home / Self Care | Payer: Medicare Other | Attending: Emergency Medicine | Admitting: Emergency Medicine

## 2019-11-06 ENCOUNTER — Encounter (HOSPITAL_COMMUNITY): Payer: Self-pay

## 2019-11-06 ENCOUNTER — Ambulatory Visit: Payer: Self-pay

## 2019-11-06 DIAGNOSIS — R5383 Other fatigue: Secondary | ICD-10-CM | POA: Insufficient documentation

## 2019-11-06 DIAGNOSIS — R42 Dizziness and giddiness: Secondary | ICD-10-CM | POA: Diagnosis present

## 2019-11-06 DIAGNOSIS — Z5321 Procedure and treatment not carried out due to patient leaving prior to being seen by health care provider: Secondary | ICD-10-CM | POA: Diagnosis not present

## 2019-11-06 LAB — BASIC METABOLIC PANEL WITH GFR
Anion gap: 10 (ref 5–15)
BUN: 20 mg/dL (ref 8–23)
CO2: 21 mmol/L — ABNORMAL LOW (ref 22–32)
Calcium: 9.2 mg/dL (ref 8.9–10.3)
Chloride: 106 mmol/L (ref 98–111)
Creatinine, Ser: 1.53 mg/dL — ABNORMAL HIGH (ref 0.61–1.24)
GFR calc Af Amer: 48 mL/min — ABNORMAL LOW
GFR calc non Af Amer: 42 mL/min — ABNORMAL LOW
Glucose, Bld: 137 mg/dL — ABNORMAL HIGH (ref 70–99)
Potassium: 4.1 mmol/L (ref 3.5–5.1)
Sodium: 137 mmol/L (ref 135–145)

## 2019-11-06 LAB — CBC
HCT: 44 % (ref 39.0–52.0)
Hemoglobin: 13.7 g/dL (ref 13.0–17.0)
MCH: 26.6 pg (ref 26.0–34.0)
MCHC: 31.1 g/dL (ref 30.0–36.0)
MCV: 85.4 fL (ref 80.0–100.0)
Platelets: 213 K/uL (ref 150–400)
RBC: 5.15 MIL/uL (ref 4.22–5.81)
RDW: 14.1 % (ref 11.5–15.5)
WBC: 5.1 K/uL (ref 4.0–10.5)
nRBC: 0 % (ref 0.0–0.2)

## 2019-11-06 MED ORDER — SODIUM CHLORIDE 0.9% FLUSH: 3.0000 mL | Freq: Once | INTRAVENOUS | Status: DC

## 2019-11-06 NOTE — ED Triage Notes (Signed)
Patient states today he was asleep in his recliner and when he stood up he felt dizzy and patient states he has been feeling tired.

## 2019-11-06 NOTE — Telephone Encounter (Signed)
FYI

## 2019-11-06 NOTE — Telephone Encounter (Signed)
Patients wife called stating that her husband is dizzy.  She states that it started today. He states that he feels that he my pass out when he stands up. He has not fallen. He has not been sick. Per protocol patient wife will take him to ER for evaluation. Care advice read to Mrs Saran.  She verbalized understanding.  Reason for Disposition . SEVERE dizziness (e.g., unable to stand, requires support to walk, feels like passing out now)  Answer Assessment - Initial Assessment Questions 1. DESCRIPTION: "Describe your dizziness."     lightheaded 2. LIGHTHEADED: "Do you feel lightheaded?" (e.g., somewhat faint, woozy, weak upon standing)     yes 3. VERTIGO: "Do you feel like either you or the room is spinning or tilting?" (i.e. vertigo)     no 4. SEVERITY: "How bad is it?"  "Do you feel like you are going to faint?" "Can you stand and walk?"   - MILD - walking normally   - MODERATE - interferes with normal activities (e.g., work, school)    - SEVERE - unable to stand, requires support to walk, feels like passing out now.      moderate 5. ONSET:  "When did the dizziness begin?"     today 6. AGGRAVATING FACTORS: "Does anything make it worse?" (e.g., standing, change in head position)     *No Answer* 7. HEART RATE: "Can you tell me your heart rate?" "How many beats in 15 seconds?"  (Note: not all patients can do this)       *No Answer* 8. CAUSE: "What do you think is causing the dizziness?"     *No Answer* 9. RECURRENT SYMPTOM: "Have you had dizziness before?" If so, ask: "When was the last time?" "What happened that time?"     *No Answer* 10. OTHER SYMPTOMS: "Do you have any other symptoms?" (e.g., fever, chest pain, vomiting, diarrhea, bleeding)       *No Answer* 11. PREGNANCY: "Is there any chance you are pregnant?" "When was your last menstrual period?"       *No Answer*  Protocols used: DIZZINESS Heidi Dach

## 2019-11-06 NOTE — ED Notes (Signed)
Pt informed staff that he does not want to wait any longer. Staff explained the delay. Pt still would like to leave. Vitals rechecked prior to leaving.

## 2019-12-03 ENCOUNTER — Other Ambulatory Visit: Payer: Self-pay | Admitting: *Deleted

## 2019-12-03 ENCOUNTER — Telehealth: Payer: Self-pay | Admitting: Family Medicine

## 2019-12-03 DIAGNOSIS — I10 Essential (primary) hypertension: Secondary | ICD-10-CM

## 2019-12-03 MED ORDER — TERAZOSIN HCL 2 MG PO CAPS: 4.0000 mg | ORAL_CAPSULE | Freq: Every day | ORAL | 0 refills | Status: DC

## 2019-12-03 NOTE — Telephone Encounter (Signed)
Copied from [place] [id number]. Topic: General - Other >> Dec 03, 2019  3:30 PM Keene Breath wrote: Reason for CRM: Patient called to ask if he could get a week supply of his medication for terazosin (HYTRIN) 2 MG capsule.  He stated that it should have been delivered on 12/4, but nothing has arrived yet.  Patient would like it sent to his local pharmacy at [place], [place] - [address] [phone] (Phone) [phone] (Fax) until his mail order arrives.  Please advise and call if there are any questions at [phone]

## 2019-12-03 NOTE — Telephone Encounter (Signed)
Rx refilled for 2 months as patient will be due for physical then

## 2020-02-04 ENCOUNTER — Telehealth: Payer: Self-pay | Admitting: Family Medicine

## 2020-02-04 NOTE — Telephone Encounter (Signed)
Pt wife [name] [name] would like to speak with Dr. Clent Ridges or his nurse in regards to pt seeing people in the house and taking things that are not really there.   Patient Phone: [phone] (speak with pt wife)

## 2020-02-05 ENCOUNTER — Other Ambulatory Visit: Payer: Self-pay

## 2020-02-05 ENCOUNTER — Telehealth (INDEPENDENT_AMBULATORY_CARE_PROVIDER_SITE_OTHER): Payer: Medicare Other | Admitting: Family Medicine

## 2020-02-05 DIAGNOSIS — R441 Visual hallucinations: Secondary | ICD-10-CM

## 2020-02-05 NOTE — Progress Notes (Signed)
Virtual Visit via Telephone Note  I connected with the patient on 02/05/20 at  1:30 PM EST by telephone and verified that I am speaking with the correct person using two identifiers.   I discussed the limitations, risks, security and privacy concerns of performing an evaluation and management service by telephone and the availability of in person appointments. I also discussed with the patient that there may be a patient responsible charge related to this service. The patient expressed understanding and agreed to proceed.  Location patient: home Location provider: work or home office Participants present for the call: patient, provider Patient did not have a visit in the prior 7 days to address this/these issue(s).   History of Present Illness: Here with his wife for visual hallucinations. About a month ago he began to see strange people wandering through his house, both day and night. He does not recognize any of them. They look at him and stare at him, but they never speak to him. They sometimes walk through walls from room to room. Of course his wife has never seen them. He says he is not frightened by them and he does not feel threatened, but he is frustrated because they will not go away. He denies very hearing things that his wife cannot hear. The only only symptom he mentions is a mild headache and some mild dizziness that comes and goes. His medications have been changed for several years. He eats and drinks normally. His wife says he gets a little agitated or angry at times, but he does not seem to be depressed to her. He sleeps well at night.    Observations/Objective: Patient sounds cheerful and well on the phone. I do not appreciate any SOB. Speech and thought processing are grossly intact. Patient reported vitals:  Assessment and Plan: Visual hallucinations. We agreed for them to come to my clinic tomorrow morning to further evaluate these events.  Gershon Crane, MD   Follow Up  Instructions:     [phone] [phone] 21-30 I did not refer this patient for an OV in the next 24 hours for this/these issue(s).  I discussed the assessment and treatment plan with the patient. The patient was provided an opportunity to ask questions and all were answered. The patient agreed with the plan and demonstrated an understanding of the instructions.   The patient was advised to call back or seek an in-person evaluation if the symptoms worsen or if the condition fails to improve as anticipated.  I provided 14 minutes of non-face-to-face time during this encounter.   Gershon Crane, MD

## 2020-02-06 ENCOUNTER — Encounter: Payer: Self-pay | Admitting: Family Medicine

## 2020-02-06 ENCOUNTER — Ambulatory Visit (INDEPENDENT_AMBULATORY_CARE_PROVIDER_SITE_OTHER): Payer: Medicare Other | Admitting: Family Medicine

## 2020-02-06 ENCOUNTER — Other Ambulatory Visit (INDEPENDENT_AMBULATORY_CARE_PROVIDER_SITE_OTHER): Payer: Medicare Other

## 2020-02-06 VITALS — BP 140/62 | HR 79 | Temp 98.1°F | Wt 191.0 lb

## 2020-02-06 DIAGNOSIS — R441 Visual hallucinations: Secondary | ICD-10-CM

## 2020-02-06 LAB — VITAMIN B12: Vitamin B-12: 758 pg/mL (ref 211–911)

## 2020-02-06 LAB — CBC WITH DIFFERENTIAL/PLATELET
Basophils Absolute: 0 10*3/uL (ref 0.0–0.1)
Basophils Relative: 0.6 % (ref 0.0–3.0)
Eosinophils Absolute: 0.1 10*3/uL (ref 0.0–0.7)
Eosinophils Relative: 3.6 % (ref 0.0–5.0)
HCT: 43.1 % (ref 39.0–52.0)
Hemoglobin: 14 g/dL (ref 13.0–17.0)
Lymphocytes Relative: 29.5 % (ref 12.0–46.0)
Lymphs Abs: 1 10*3/uL (ref 0.7–4.0)
MCHC: 32.4 g/dL (ref 30.0–36.0)
MCV: 83.1 fl (ref 78.0–100.0)
Monocytes Absolute: 0.4 10*3/uL (ref 0.1–1.0)
Monocytes Relative: 10.8 % (ref 3.0–12.0)
Neutro Abs: 2 10*3/uL (ref 1.4–7.7)
Neutrophils Relative %: 55.5 % (ref 43.0–77.0)
Platelets: 203 10*3/uL (ref 150.0–400.0)
RBC: 5.19 Mil/uL (ref 4.22–5.81)
RDW: 13.8 % (ref 11.5–15.5)
WBC: 3.6 10*3/uL — ABNORMAL LOW (ref 4.0–10.5)

## 2020-02-06 LAB — HEPATIC FUNCTION PANEL
ALT: 24 U/L (ref 0–53)
AST: 26 U/L (ref 0–37)
Albumin: 4.4 g/dL (ref 3.5–5.2)
Alkaline Phosphatase: 54 U/L (ref 39–117)
Bilirubin, Direct: 0.2 mg/dL (ref 0.0–0.3)
Total Bilirubin: 0.7 mg/dL (ref 0.2–1.2)
Total Protein: 6.9 g/dL (ref 6.0–8.3)

## 2020-02-06 LAB — URINALYSIS
Bilirubin Urine: NEGATIVE
Hgb urine dipstick: NEGATIVE
Ketones, ur: 40 — AB
Leukocytes,Ua: NEGATIVE
Nitrite: NEGATIVE
Specific Gravity, Urine: 1.025 (ref 1.000–1.030)
Total Protein, Urine: NEGATIVE
Urine Glucose: NEGATIVE
Urobilinogen, UA: 0.2 (ref 0.0–1.0)
pH: 5.5 (ref 5.0–8.0)

## 2020-02-06 LAB — BASIC METABOLIC PANEL
BUN: 15 mg/dL (ref 6–23)
CO2: 24 mEq/L (ref 19–32)
Calcium: 9.4 mg/dL (ref 8.4–10.5)
Chloride: 106 mEq/L (ref 96–112)
Creatinine, Ser: 1.06 mg/dL (ref 0.40–1.50)
GFR: 80.85 mL/min (ref >60.00)
Glucose, Bld: 101 mg/dL — ABNORMAL HIGH (ref 70–99)
Potassium: 3.9 mEq/L (ref 3.5–5.1)
Sodium: 140 mEq/L (ref 135–145)

## 2020-02-06 LAB — PSA: PSA: 1.4 ng/mL (ref 0.10–4.00)

## 2020-02-06 LAB — LIPID PANEL
Cholesterol: 215 mg/dL — ABNORMAL HIGH (ref 0–200)
HDL: 80.5 mg/dL (ref >39.00)
LDL Cholesterol: 122 mg/dL — ABNORMAL HIGH (ref 0–99)
NonHDL: 134.59
Total CHOL/HDL Ratio: 3
Triglycerides: 65 mg/dL (ref 0.0–149.0)
VLDL: 13 mg/dL (ref 0.0–40.0)

## 2020-02-06 LAB — AMMONIA: Ammonia: 23 umol/L (ref 11–35)

## 2020-02-06 LAB — TSH: TSH: 1.41 u[IU]/mL (ref 0.35–4.50)

## 2020-02-06 NOTE — Addendum Note (Signed)
Addended by: Philemon Kingdom on: 02/06/2020 09:06 AM   Modules accepted: Orders

## 2020-02-06 NOTE — Telephone Encounter (Signed)
He is on the schedule to be seen today

## 2020-02-06 NOTE — Progress Notes (Signed)
   Subjective:    Patient ID: [name] [name], male    DOB: Jan 07, 1935, 85 y.o.   MRN: [id number]  HPI Here with his wife to discuss some visual hallucinations he has been experiencing for the past several weeks. He is completely convinced that what he sees is real, even though his wife cannot see them. He describes seeing strange people walking through his home, any hour of the day or night. They can walk through walls and stick their heads through chimneys. They never speak, they simply stare at [facility]. He denies any other symptoms, and he feels fine physically. His wife says he has not seemed depressed at all, he just gets frustrated because these people he sees will not leave him alone. He sleeps well, appetite is good. He does not use alcohol.    Review of Systems  Constitutional: Negative.   Respiratory: Negative.   Cardiovascular: Negative.   Gastrointestinal: Negative.   Genitourinary: Negative.   Neurological: Negative.   Psychiatric/Behavioral: Positive for hallucinations. Negative for agitation, confusion, dysphoric mood, self-injury, sleep disturbance and suicidal ideas. The patient is not nervous/anxious.        Objective:   Physical Exam Constitutional:      Appearance: Normal appearance. He is not ill-appearing.  Cardiovascular:     Rate and Rhythm: Normal rate and regular rhythm.     Pulses: Normal pulses.     Heart sounds: Normal heart sounds.  Pulmonary:     Effort: Pulmonary effort is normal.     Breath sounds: Normal breath sounds.  Abdominal:     General: Abdomen is flat. Bowel sounds are normal. There is no distension.     Palpations: Abdomen is soft. There is no mass.     Tenderness: There is no abdominal tenderness. There is no guarding or rebound.     Hernia: No hernia is present.  Neurological:     General: No focal deficit present.     Mental Status: He is alert and oriented to person, place, and time. Mental status is at baseline.  Psychiatric:      Mood and Affect: Mood normal.        Behavior: Behavior normal.        Judgment: Judgment normal.           Assessment & Plan:  Visual hallucinations. These are likely of psychiatric etiology, but we will investigate for other possible problems. Get labs today and set up a brain MRI. We spent 45 minutes together working on this issue. Gershon Crane, MD

## 2020-02-08 ENCOUNTER — Other Ambulatory Visit: Payer: Self-pay

## 2020-02-08 ENCOUNTER — Emergency Department (HOSPITAL_COMMUNITY): Payer: Medicare Other

## 2020-02-08 ENCOUNTER — Emergency Department (HOSPITAL_COMMUNITY)
Admission: EM | Admit: 2020-02-08 | Discharge: 2020-02-12 | Disposition: A | Discharge status: Other Acute Inpatient Hospital | Payer: Medicare Other | Attending: Emergency Medicine | Admitting: Emergency Medicine

## 2020-02-08 ENCOUNTER — Encounter (HOSPITAL_COMMUNITY): Payer: Self-pay

## 2020-02-08 DIAGNOSIS — Z87891 Personal history of nicotine dependence: Secondary | ICD-10-CM | POA: Diagnosis not present

## 2020-02-08 DIAGNOSIS — F060 Psychotic disorder with hallucinations due to known physiological condition: Secondary | ICD-10-CM | POA: Insufficient documentation

## 2020-02-08 DIAGNOSIS — I10 Essential (primary) hypertension: Secondary | ICD-10-CM | POA: Insufficient documentation

## 2020-02-08 DIAGNOSIS — Z20822 Contact with and (suspected) exposure to covid-19: Secondary | ICD-10-CM | POA: Diagnosis not present

## 2020-02-08 DIAGNOSIS — R443 Hallucinations, unspecified: Secondary | ICD-10-CM | POA: Diagnosis not present

## 2020-02-08 DIAGNOSIS — Z79899 Other long term (current) drug therapy: Secondary | ICD-10-CM | POA: Insufficient documentation

## 2020-02-08 LAB — RESPIRATORY PANEL BY RT PCR (FLU A&B, COVID)
Influenza A by PCR: NEGATIVE
Influenza B by PCR: NEGATIVE
SARS Coronavirus 2 by RT PCR: NEGATIVE

## 2020-02-08 LAB — RAPID URINE DRUG SCREEN, HOSP PERFORMED
Amphetamines: NOT DETECTED
Barbiturates: NOT DETECTED
Benzodiazepines: NOT DETECTED
Cocaine: NOT DETECTED
Opiates: NOT DETECTED
Tetrahydrocannabinol: NOT DETECTED

## 2020-02-08 LAB — HEPATIC FUNCTION PANEL
ALT: 22 U/L (ref 0–44)
AST: 31 U/L (ref 15–41)
Albumin: 4.3 g/dL (ref 3.5–5.0)
Alkaline Phosphatase: 51 U/L (ref 38–126)
Bilirubin, Direct: 0.2 mg/dL (ref 0.0–0.2)
Indirect Bilirubin: 0.3 mg/dL (ref 0.3–0.9)
Total Bilirubin: 0.5 mg/dL (ref 0.3–1.2)
Total Protein: 7 g/dL (ref 6.5–8.1)

## 2020-02-08 LAB — ETHANOL: Alcohol, Ethyl (B): <10 mg/dL (ref <10)

## 2020-02-08 MED ORDER — LORAZEPAM 0.5 MG PO TABS
0.5000 mg | ORAL_TABLET | Freq: Once | ORAL | Status: AC
  Administered 2020-02-08: 0.5 mg via ORAL
  Filled 2020-02-08: qty 1

## 2020-02-08 NOTE — ED Triage Notes (Signed)
Pt presents with c/o hallucinations. Pt reports that it is his wife who is unable to see the things that he is seeing. Pt is calm and cooperative.

## 2020-02-08 NOTE — BH Assessment (Signed)
Tele Assessment Note   Patient Name: [name] [name] MRN: [id number] Referring Physician: Dr. Melene Plan, DO Location of Patient: [facility] Location of Provider: Behavioral Health TTS Department  BIRT REINOSO is an 85 y.o. male who was brought to [facility] with his wife at the insistence of his doctor, as pt states he has been experiencing AVH for some years but they have been getting worse over the last month. According to notes by pt's EDP, pt has been seeing people walk throughout his house for the past month; the notes state the people, whom he does not know, stare at him but never say anything. Pt reported during the assessment that there are children that sit down for meals and never talk to him, which he finds is rude. He expresses frustration that his wife is unable to see the same things he is able to see.  Pt was difficult to assess at times, as he deflected questions and brought up religion, how to raise children, society and the way it is headed, etc. throughout. Pt also attempted to deflect by asking questions of clinician, including her background, her age, who she works for, her degrees, where she attends church, Catering manager. Clinician explained the assessment, the process of the assessment, the goal of the assessment, and what the outcome of the assessment would assist with determining, but pt either did not fully understand the process or did not want to answer questions/was unable to answer questions so was purposely difficult and deflected when possible.  Clinician requested pt share why he was brought in to speak to clinician tonight and he stated, "I like to get the basis of things in life, and talking with you, and dealing with certain things in life, I'd rather have a quiet conversation than the other type." Pt noted some conversations were loud and in groups, but he would rather have a quiet conversation between several people instead. Clinician inquired as to how thing have been for  pt as of late, and pt stated, "not too good." When clinician prompted a to why this was, pt stated, "I've had a few problems. Things [are] not as they should be."  Pt denies SI, a hx of SI, any prior attempts to kill himself, any prior hospitalizations for mental health reasons, or any plan to kill himself. Pt denies HI, any prior hx of harm to others, and any prior NSSIB. Pt acknowledged AVH, stating he both sees and hears things others don't.   Pt granted verbal consent for his wife to stay in the room throughout the entirety of the assessment.  Pt was oriented x2; he knew his name and DOB and that he was in [place], though he thought the year was 2019 and he was unable to identify that he was in a hospital, even when clinician prompted as to what type of building it was and asked if it was a school, or a hardware store, or something else. Pt stated it was several different things, as it had many different buildings combined, and stated it was a store, a hardware store, and was part of a shopping center.   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 Allergy   . Diverticulosis of colon (without mention of hemorrhage) 03-01-2004, 04-04-2011   Colonoscopy  . ED (erectile dysfunction)   . Glaucoma    sees Dr. Arvil Chaco   .  Hyperglycemia   . Hyperlipidemia   . Hypertension   . Internal hemorrhoids 03-15-1999   Flex Sig   . Irritable bowel     Past Surgical History:  Procedure Laterality Date  . COLONOSCOPY  04-04-11   per Dr. Jarold Motto, clear, no repeats needed  . EYE SURGERY     bilateral cataract extraction per Dr Jettie Pagan  . INGUINAL HERNIA REPAIR    . KNEE ARTHROSCOPY     right knee  . NASAL SINUS SURGERY      Family History:  Family History  Problem Relation Age of Onset  . Hypertension Mother   . Lung cancer Father   . Colitis Daughter     Social History:   reports that he quit smoking about 50 years ago. He has never used smokeless tobacco. He reports current alcohol use. He reports that he does not use drugs.  Additional Social History:  Alcohol / Drug Use Pain Medications: Please see MAR Prescriptions: Please see MAR Over the Counter: Please see MAR History of alcohol / drug use?: No history of alcohol / drug abuse Longest period of sobriety (when/how long): Pt denies SA  CIWA: CIWA-Ar BP: (!) 156/87 Pulse Rate: 69 COWS:    Allergies:  Allergies  Allergen Reactions  . Chocolate     Itching if he eats too much chocolate   . Ciprofloxacin     Achilles tendon pain   . Lipitor [Atorvastatin]     Muscle aches     Home Medications: (Not in a hospital admission)   OB/GYN Status:  No LMP for male patient.  General Assessment Data Location of Assessment: [facility] ED TTS Assessment: In system Is this a Tele or Face-to-Face Assessment?: Tele Assessment Is this an Initial Assessment or a Re-assessment for this encounter?: Initial Assessment Patient Accompanied by:: Adult([name] Atchley, wife: [phone]; [phone]) Permission Given to speak with another: Yes Name, Relationship and Phone Number: Oslo Huntsman, wife: [phone]; [phone] Language Other than English: No Living Arrangements: (Pt lives at home with his wife) What gender do you identify as?: Male Marital status: Married Living Arrangements: Spouse/significant other Can pt return to current living arrangement?: Yes Admission Status: Voluntary Is patient capable of signing voluntary admission?: Yes Referral Source: Self/Family/Friend Insurance type: [employer]     Crisis Care Plan Living Arrangements: Spouse/significant other Legal Guardian: Other:(Self) Name of Psychiatrist: None Name of Therapist: None  Education Status Is patient currently in school?: No Is the patient employed, unemployed or receiving disability?: Receiving  disability income  Risk to self with the past 6 months Suicidal Ideation: No Has patient been a risk to self within the past 6 months prior to admission? : No Suicidal Intent: No Has patient had any suicidal intent within the past 6 months prior to admission? : No Is patient at risk for suicide?: No Suicidal Plan?: No Has patient had any suicidal plan within the past 6 months prior to admission? : No Access to Means: No What has been your use of drugs/alcohol within the last 12 months?: Pt denies SA Previous Attempts/Gestures: No How many times?: 0 Other Self Harm Risks: Pt is currently experiencing AVH Triggers for Past Attempts: None known Intentional Self Injurious Behavior: None Family Suicide History: Unable to assess Recent stressful life event(s): Other (Comment)(Pt is currently experiencing AVH) Persecutory voices/beliefs?: No Depression: No Substance abuse history and/or treatment for substance abuse?: No Suicide prevention information given to non-admitted patients: Not applicable  Risk to Others within the past 6 months  Homicidal Ideation: No Does patient have any lifetime risk of violence toward others beyond the six months prior to admission? : No Thoughts of Harm to Others: No Current Homicidal Intent: No Current Homicidal Plan: No Access to Homicidal Means: No Identified Victim: None noted History of harm to others?: No Assessment of Violence: None Noted Violent Behavior Description: None noted Does patient have access to weapons?: No Criminal Charges Pending?: No Does patient have a court date: No Is patient on probation?: No  Psychosis Hallucinations: Auditory, Visual Delusions: None noted  Mental Status Report Appearance/Hygiene: Unremarkable Eye Contact: Good Motor Activity: Unremarkable Speech: Logical/coherent Level of Consciousness: Alert Mood: Preoccupied Affect: Appropriate to circumstance Anxiety Level: Moderate Thought Processes:  Circumstantial Judgement: Partial Orientation: Person Obsessive Compulsive Thoughts/Behaviors: Moderate  Cognitive Functioning Concentration: Fair Memory: Recent Intact, Remote Intact Is patient IDD: No Insight: Fair Impulse Control: Fair Appetite: Good Have you had any weight changes? : No Change Sleep: No Change Total Hours of Sleep: 10 Vegetative Symptoms: None  ADLScreening [facility] Assessment Services) Patient's cognitive ability adequate to safely complete daily activities?: Yes Patient able to express need for assistance with ADLs?: Yes Independently performs ADLs?: Yes (appropriate for developmental age)  Prior Inpatient Therapy Prior Inpatient Therapy: No  Prior Outpatient Therapy Prior Outpatient Therapy: No Does patient have an ACCT team?: No Does patient have Intensive In-House Services?  : No Does patient have [facility] services? : No Does patient have P4CC services?: No  ADL Screening (condition at time of admission) Patient's cognitive ability adequate to safely complete daily activities?: Yes Is the patient deaf or have difficulty hearing?: No Does the patient have difficulty seeing, even when wearing glasses/contacts?: No Does the patient have difficulty concentrating, remembering, or making decisions?: No Patient able to express need for assistance with ADLs?: Yes Does the patient have difficulty dressing or bathing?: No Independently performs ADLs?: Yes (appropriate for developmental age) Does the patient have difficulty walking or climbing stairs?: No Weakness of Legs: None Weakness of Arms/Hands: None  Home Assistive Devices/Equipment Home Assistive Devices/Equipment: Eyeglasses  Therapy Consults (therapy consults require a physician order) PT Evaluation Needed: No OT Evalulation Needed: No SLP Evaluation Needed: No Abuse/Neglect Assessment (Assessment to be complete while patient is alone) Abuse/Neglect Assessment Can Be Completed: Unable to assess,  patient is non-responsive or altered mental status Values / Beliefs Cultural Requests During Hospitalization: (UTA) Spiritual Requests During Hospitalization: (UTA) Consults Spiritual Care Consult Needed: (UTA) Transition of Care Team Consult Needed: (UTA) Advance Directives (For Healthcare) Does Patient Have a Medical Advance Directive?: No Would patient like information on creating a medical advance directive?: No - Patient declined          Disposition: Lindon Romp, NP, reviewed pt's chart and information and determined pt meets criteria for inpatient geriatric hospitalization. This information was provided to pt's nurse, Larry Sierras, at [phone].   Disposition Initial Assessment Completed for this Encounter: Yes Patient referred to: Other (Comment)(Pt's referral info will be faxed out to multiple hospitals)  This service was provided via telemedicine using a 2-way, interactive audio and video technology.  Names of all persons participating in this telemedicine service and their role in this encounter. Name: Rise Mu Role: Patient  Name: Rogers Seeds Role: Patient's Wife  Name: Lindon Romp Role: Nurse Practitioner  Name: Windell Hummingbird Role: Clinician    Dannielle Burn 02/08/2020 11:07 PM

## 2020-02-08 NOTE — ED Notes (Signed)
Patient transported to MRI 

## 2020-02-08 NOTE — ED Notes (Signed)
Patient given urinal to provide urine sample.  

## 2020-02-08 NOTE — ED Notes (Signed)
PT provided Sandwich and water. Wife remains at bedside.

## 2020-02-08 NOTE — ED Notes (Signed)
TTS cart at bedside. 

## 2020-02-08 NOTE — ED Provider Notes (Signed)
[facility]-EMERGENCY DEPT Provider Note   CSN: [id number] Arrival date & time: 02/08/20  1424     History Chief Complaint  Patient presents with  . Hallucinations    [name] [name] is a 85 y.o. male.  85 yo M with a chief complaint of hallucinations.  Off and on for about a month.  Spoke with his family physician about this about 48 hours ago and had ordered his outpatient MRI.  Family said that these got worse we certainly barricade himself in the house and they are concerned that he needs to have this work-up evaluated before he gets too severe.  Patient states that he sees people that he does not know that are in his house.  They do not tend to interact with him.  No recent medication changes.  In the family doctor's note says that there is not been medication change in years.  Has a remote head injury but nothing recent.  No fevers no cough no vomiting diagonally has not had chest pain no headache or neck pain.  Denies one-sided numbness or weakness denies difficulty with speech or swallowing.  Denies alcohol use.  Denies illegal drug use.  The history is provided by the patient.  Illness Severity:  Moderate Onset quality:  Gradual Duration:  1 month Timing:  Constant Progression:  Worsening Chronicity:  New Associated symptoms: no abdominal pain, no chest pain, no congestion, no diarrhea, no fever, no headaches, no myalgias, no rash, no shortness of breath and no vomiting        Past Medical History:  Diagnosis Date  . Allergy   . Diverticulosis of colon (without mention of hemorrhage) 03-01-2004, 04-04-2011   Colonoscopy  . ED (erectile dysfunction)   . Glaucoma    sees Dr. Arvil Chaco   . Hyperglycemia   . Hyperlipidemia   . Hypertension   . Internal hemorrhoids 03-15-1999   Flex Sig   . Irritable bowel     Patient Active Problem List   Diagnosis Date Noted  . Hallucinations 02/09/2020  . Abdominal pain, acute 08/21/2014  . Enteritis  08/20/2014  . Abdominal pain 08/20/2014  . GI bleeding 05/26/2012  . Renal mass 05/26/2012  . Diverticulosis of colon with hemorrhage 05/26/2012  . Acute posthemorrhagic anemia 05/26/2012  . SHOULDER PAIN, BILATERAL 07/30/2010  . HERPES SIMPLEX INFECTION 10/10/2007  . TACHYCARDIA, PAROXYSMAL NOS 10/10/2007  . EOSINOPHILIA 07/17/2007  . Dyslipidemia 07/11/2007  . Essential hypertension 07/11/2007  . ALLERGIC RHINITIS 07/11/2007    Past Surgical History:  Procedure Laterality Date  . COLONOSCOPY  04-04-11   per Dr. Jarold Motto, clear, no repeats needed  . EYE SURGERY     bilateral cataract extraction per Dr Jettie Pagan  . INGUINAL HERNIA REPAIR    . KNEE ARTHROSCOPY     right knee  . NASAL SINUS SURGERY         Family History  Problem Relation Age of Onset  . Hypertension Mother   . Lung cancer Father   . Colitis Daughter     Social History   Tobacco Use  . Smoking status: Former Smoker    Quit date: 03/20/1969    Years since quitting: 50.9  . Smokeless tobacco: Never Used  Substance Use Topics  . Alcohol use: Yes    Alcohol/week: 0.0 standard drinks    Comment: occ  . Drug use: No    Home Medications Prior to Admission medications   Medication Sig Start Date End Date Taking? Authorizing  Provider  AZOPT 1 % ophthalmic suspension Place 1 drop into both eyes 3 (three) times daily. 12/18/19  Yes [provider]  bimatoprost (LUMIGAN) 0.01 % SOLN Place 1 drop into both eyes at bedtime.   Yes [provider]  CARTIA XT 300 MG 24 hr capsule TAKE 1 CAPSULE DAILY 05/01/19  Yes Laurey Morale, MD  cetirizine (ZYRTEC) 10 MG tablet Take 10 mg by mouth daily as needed for allergies.    Yes [provider]  Coenzyme Q10 (CO Q 10 PO) Take 1 capsule by mouth 2 (two) times daily.    Yes [provider]  cyclobenzaprine (FLEXERIL) 10 MG tablet Take 1 tablet (10 mg total) by mouth 3 (three) times daily as needed for muscle spasms. 03/15/18  Yes Laurey Morale, MD  fish oil-omega-3 fatty acids 1000 MG capsule Take 1 g by mouth daily.    Yes [provider]  Flaxseed, Linseed, (FLAX SEED OIL PO) Take 1 capsule by mouth 3 (three) times daily.    Yes [provider]  Glucosamine-Chondroitin (COSAMIN DS) 500-400 MG CAPS Take 1 tablet by mouth 2 (two) times daily.   Yes [provider]  Multiple Vitamins-Minerals (MULTIVITAMIN WITH MINERALS) tablet Take 1 tablet by mouth 2 (two) times daily.    Yes [provider]  RHOPRESSA 0.02 % SOLN Place 1 drop into both eyes at bedtime. 08/17/19  Yes [provider]  terazosin (HYTRIN) 2 MG capsule Take 2 capsules (4 mg total) by mouth at bedtime. 12/03/19  Yes Laurey Morale, MD    Allergies    Chocolate, Ciprofloxacin, and Lipitor [atorvastatin]  Review of Systems   Review of Systems  Constitutional: Negative for chills and fever.  HENT: Negative for congestion and facial swelling.   Eyes: Negative for discharge and visual disturbance.  Respiratory: Negative for shortness of breath.   Cardiovascular: Negative for chest pain and palpitations.  Gastrointestinal: Negative for abdominal pain, diarrhea and vomiting.  Musculoskeletal: Negative for arthralgias and myalgias.  Skin: Negative for color change and rash.  Neurological: Negative for tremors, syncope and headaches.  Psychiatric/Behavioral: Positive for hallucinations. Negative for confusion and dysphoric mood.    Physical Exam Updated Vital Signs BP (!) 155/81   Pulse 60   Temp 98.1 F (36.7 C) (Oral)   Resp 14   SpO2 96%   Physical Exam Vitals and nursing note reviewed.  Constitutional:      Appearance: He is well-developed.  HENT:     Head: Normocephalic and atraumatic.  Eyes:     Pupils: Pupils are equal, round, and reactive to light.  Neck:     Vascular: No JVD.  Cardiovascular:     Rate and Rhythm: Normal rate and regular rhythm.     Heart sounds: No murmur. No friction rub. No gallop.     Pulmonary:     Effort: No respiratory distress.     Breath sounds: No wheezing.  Abdominal:     General: There is no distension.     Tenderness: There is no guarding or rebound.  Musculoskeletal:        General: Normal range of motion.     Cervical back: Normal range of motion and neck supple.  Skin:    Coloration: Skin is not pale.     Findings: No rash.  Neurological:     Mental Status: He is alert and oriented to person, place, and time.     Cranial Nerves: Cranial nerves are  intact.     Sensory: Sensation is intact.     Motor: Motor function is intact.     Coordination: Coordination is intact.     Gait: Gait is intact.     Comments: Patient has rotary nystagmus otherwise he has a benign neuro exam.  Psychiatric:        Behavior: Behavior normal.     ED Results / Procedures / Treatments   Labs (all labs ordered are listed, but only abnormal results are displayed) Labs Reviewed  RESPIRATORY PANEL BY RT PCR (FLU A&B, COVID)  HEPATIC FUNCTION PANEL  ETHANOL  RAPID URINE DRUG SCREEN, HOSP PERFORMED    EKG None  Radiology DG Chest 2 View  Result Date: 02/08/2020 CLINICAL DATA:  Confusion, hallucinations, previous tobacco abuse EXAM: CHEST - 2 VIEW COMPARISON:  01/25/2018 FINDINGS: Frontal and lateral views of the chest demonstrate a stable cardiac silhouette. Chronic hyperinflation and interstitial scarring without airspace disease, effusion, or pneumothorax. No acute bony abnormalities. IMPRESSION: 1. Stable emphysema, no acute process. Electronically Signed   By: Sharlet Salina M.D.   On: 02/08/2020 15:49   CT Head Wo Contrast  Result Date: 02/08/2020 CLINICAL DATA:  Psychosis, hallucinations EXAM: CT HEAD WITHOUT CONTRAST TECHNIQUE: Contiguous axial images were obtained from the base of the skull through the vertex without intravenous contrast. COMPARISON:  None. FINDINGS: Brain: No acute infarct or hemorrhage. Diffuse cerebral atrophy is age-appropriate. Ex vacuo  dilatation of the lateral ventricles. Midline structures are unremarkable. No acute extra-axial fluid collections. No mass effect Vascular: No hyperdense vessel or unexpected calcification. Skull: Normal. Negative for fracture or focal lesion. Sinuses/Orbits: Postsurgical changes from prior right medial wall antrectomy. Sequela of chronic right maxillary sinus disease. Minimal polypoid mucosal thickening within the ethmoid air cells and left maxillary sinus. Other: None IMPRESSION: 1. Chronic sinus disease as above. 2. No acute intracranial process. Electronically Signed   By: Sharlet Salina M.D.   On: 02/08/2020 16:06   MR BRAIN WO CONTRAST  Result Date: 02/08/2020 CLINICAL DATA:  Psychosis. Additional history provided: Psychosis, patient presents with report of hallucinations. EXAM: MRI HEAD WITHOUT CONTRAST TECHNIQUE: Multiplanar, multiecho pulse sequences of the brain and surrounding structures were obtained without intravenous contrast. COMPARISON:  Noncontrast head CT 02/08/2020 FINDINGS: Brain: There is no evidence of acute infarct. No midline shift or extra-axial fluid collection. No chronic intracranial blood products. Moderate patchy T2/FLAIR hyperintensity within the cerebral white matter is nonspecific, but consistent with chronic small vessel ischemic disease. Moderate generalized parenchymal atrophy with associated prominence of the ventricles and sulci. Dural-based calcification along the right frontotemporal convexity is nonspecific. This measures up to 8 mm in greatest thickness. No mass effect upon the underlying right cerebral hemisphere. Vascular: Flow voids maintained within the proximal large arterial vessels. Skull and upper cervical spine: No focal marrow lesion Sinuses/Orbits: Bilateral lens replacements. Postsurgical appearance of the paranasal sinuses. Paranasal sinus mucosal thickening greatest within bilateral ethmoid air cells. Moderate-sized left maxillary sinus mucous retention  cyst. No significant mastoid effusion. IMPRESSION: No evidence of acute intracranial abnormality. Dural-based calcification along the right frontotemporal convexity measuring up to 8 mm in thickness, nonspecific but possibly reflecting a plaque-like meningioma. No mass effect upon the underlying right cerebral hemisphere. Moderate generalized parenchymal atrophy and chronic small vessel ischemic disease. Paranasal sinus disease as described. Electronically Signed   By: Jackey Loge DO   On: 02/08/2020 19:27    Procedures Procedures (including critical care time)  Medications Ordered in ED Medications  LORazepam (ATIVAN)  tablet 0.5 mg (0.5 mg Oral Given 02/08/20 1730)    ED Course  I have reviewed the triage vital signs and the nursing notes.  Pertinent labs & imaging results that were available during my care of the patient were reviewed by me and considered in my medical decision making (see chart for details).    MDM Rules/Calculators/A&P                      85 yo M with a chief complaint of hallucinations.  Going on for about a month.  Worsening over the past 48 hours.  Had lab work done 48 hours ago ammonia was normal TSH was normal no significant electrolyte issue.  Will obtain a CT dated MRI of the brain if able.  Check alcohol level UDS chest x-ray.  Alcohol level is negative.  UDS is completely clean.  No LFT elevation.  CT of the head was negative for acute intracranial pathology.  MRI of the brain with no acute finding.  There was a calcification that could be a potential meningioma.  I discussed this with Dr. Wilford Corner he felt this was unlikely to make the patient have visual hallucinations.  At this point I feel he is medically clear we will have TTS evaluate.    Final Clinical Impression(s) / ED Diagnoses Final diagnoses:  Hallucinations    Rx / DC Orders ED Discharge Orders    None       Melene Plan, DO 02/09/20 1502

## 2020-02-08 NOTE — ED Notes (Signed)
Pt changed in purple scrubs. Pt wife took all belongings except a small bible that is in pt pocket. Pt is currently resting and watching TV and denies any needs at this time.

## 2020-02-09 ENCOUNTER — Encounter (HOSPITAL_COMMUNITY): Payer: Self-pay | Admitting: Registered Nurse

## 2020-02-09 DIAGNOSIS — R443 Hallucinations, unspecified: Secondary | ICD-10-CM

## 2020-02-09 MED ORDER — DILTIAZEM HCL ER COATED BEADS 300 MG PO CP24
300.0000 mg | ORAL_CAPSULE | Freq: Every day | ORAL | Status: DC
  Administered 2020-02-09 – 2020-02-12 (×4): 300 mg via ORAL
  Filled 2020-02-09 (×4): qty 1

## 2020-02-09 MED ORDER — NETARSUDIL DIMESYLATE 0.02 % OP SOLN: 1.0000 [drp] | Freq: Every day | OPHTHALMIC | Status: DC

## 2020-02-09 MED ORDER — LORATADINE 10 MG PO TABS
10.0000 mg | ORAL_TABLET | Freq: Every day | ORAL | Status: DC
  Administered 2020-02-09 – 2020-02-12 (×4): 10 mg via ORAL
  Filled 2020-02-09 (×4): qty 1

## 2020-02-09 MED ORDER — TERAZOSIN HCL 2 MG PO CAPS
4.0000 mg | ORAL_CAPSULE | Freq: Every day | ORAL | Status: DC
  Administered 2020-02-09 – 2020-02-11 (×3): 4 mg via ORAL
  Filled 2020-02-09 (×4): qty 2

## 2020-02-09 MED ORDER — BRINZOLAMIDE 1 % OP SUSP
1.0000 [drp] | Freq: Three times a day (TID) | OPHTHALMIC | Status: DC
  Administered 2020-02-09 – 2020-02-12 (×8): 1 [drp] via OPHTHALMIC
  Filled 2020-02-09 (×2): qty 10

## 2020-02-09 MED ORDER — LATANOPROST 0.005 % OP SOLN
1.0000 [drp] | Freq: Every day | OPHTHALMIC | Status: DC
  Administered 2020-02-09 – 2020-02-11 (×3): 1 [drp] via OPHTHALMIC
  Filled 2020-02-09 (×2): qty 2.5

## 2020-02-09 NOTE — ED Notes (Signed)
Pt sleeping with even and equal chest rise. No needs at this time.

## 2020-02-09 NOTE — Progress Notes (Signed)
02/09/2020  1400 Notified Dr Juleen China that patient's family was concerned that he has not been receiving his home medicines. Asked for his home medicines to be restarted. Also, reached out to psych team to see if they wanted to restart the patient's medicines. Waiting for orders.

## 2020-02-09 NOTE — Progress Notes (Signed)
prPatient meets criteria for inpatient treatment per Nira Conn, NP. No appropriate beds at [facility] currently. CSW faxed referrals to the following facilities for review:  [facility]   [facility]   [facility]   [facility]   [facility]   [facility]   [facility]   [facility]   [facility]     TTS will continue to seek bed placement.     Ruthann Cancer MSW, [facility] Social Worker Disposition  [facility] Ph: [phone] Fax: [phone] 02/09/2020 9:37 AM

## 2020-02-09 NOTE — ED Notes (Signed)
PT awake and watching TV. Pt denies any needs at this time. Pt stated he was able to get some rest.

## 2020-02-09 NOTE — ED Notes (Signed)
Pt asleep. Chest rise even and equal. No needs at this time.

## 2020-02-09 NOTE — Consult Note (Addendum)
Telepsych Consultation   Reason for Consult: Auditory/visual hallucinations Referring Physician:  Melene Plan, DO Location of Patient: [facility] Location of Provider: [facility]  Patient Identification: [name] [name] MRN:  [id number] Principal Diagnosis: Hallucinations Diagnosis:  Principal Problem:   Hallucinations   Total Time spent with patient: 30 minutes  Subjective:  Per TTS Assessment Note: reviewed by this provider:  [name] is a 85 y.o. male patient who was brought to [facility] with his wife at the insistence of his doctor, as pt states he has been experiencing AVH for some years but they have been getting worse over the last month. According to notes by pt's EDP, pt has been seeing people walk throughout his house for the past month; the notes state the people, whom he does not know, stare at him but never say anything. Pt reported during the assessment that there are children that sit down for meals and never talk to him, which he finds is rude. He expresses frustration that his wife is unable to see the same things he is able to see.   HPI:  [name] [name], 85 y.o., male patient seen via tele psych by this provider, Dr. Jannifer Franklin; and chart reviewed on 02/09/20.  On evaluation [name] [name] sitting up in chair during assessment.  When asked why he is in hospital patient responds "I'm sitting in this chair.  When asked if he knew where he was; patient responded "On [address]  Patient states that he lives with his wife in [place].  Patient states that he is seeing people that are floating around, but is unsure if he is hearing voices that no one else can.  States that he has been seeing things for about a year.  During assessment with questions patient appears to be paranoid not want to give direct answer; when asked who is the current president patient responded "I am." When current Month of year "I'm not trying to be rude but you are overstepping you bounds."   Patient did states that he was 85 yrs old and his DOB [birth year] but did not give the year (patient is 85 yrs old).   During evaluation [name] [name] is alert/oriented self; calm/some what cooperative until aggravated with questioning; and mood is congruent with affect.  Patient endorses visual hallucinations, and isn't sure if he is having auditory hallucinations, denies  suicidal/self-harm/homicidal ideation, psychosis, and paranoia. Patient has no prior psychiatric history.      Past Psychiatric History: None Risk to Self: Suicidal Ideation: No Suicidal Intent: No Is patient at risk for suicide?: No Suicidal Plan?: No Access to Means: No What has been your use of drugs/alcohol within the last 12 months?: Pt denies SA How many times?: 0 Other Self Harm Risks: Pt is currently experiencing AVH Triggers for Past Attempts: None known Intentional Self Injurious Behavior: None Risk to Others: Homicidal Ideation: No Thoughts of Harm to Others: No Current Homicidal Intent: No Current Homicidal Plan: No Access to Homicidal Means: No Identified Victim: None noted History of harm to others?: No Assessment of Violence: None Noted Violent Behavior Description: None noted Does patient have access to weapons?: No Criminal Charges Pending?: No Does patient have a court date: No Prior Inpatient Therapy: Prior Inpatient Therapy: No Prior Outpatient Therapy: Prior Outpatient Therapy: No Does patient have an ACCT team?: No Does patient have Intensive In-House Services?  : No Does patient have [facility] services? : No Does patient have P4CC services?: No  Past  Medical History:  Past Medical History:  Diagnosis Date  . Allergy   . Diverticulosis of colon (without mention of hemorrhage) 03-01-2004, 04-04-2011   Colonoscopy  . ED (erectile dysfunction)   . Glaucoma    sees Dr. Arvil Chaco   . Hyperglycemia   . Hyperlipidemia   . Hypertension   . Internal hemorrhoids 03-15-1999   Flex Sig   .  Irritable bowel     Past Surgical History:  Procedure Laterality Date  . COLONOSCOPY  04-04-11   per Dr. Jarold Motto, clear, no repeats needed  . EYE SURGERY     bilateral cataract extraction per Dr Jettie Pagan  . INGUINAL HERNIA REPAIR    . KNEE ARTHROSCOPY     right knee  . NASAL SINUS SURGERY     Family History:  Family History  Problem Relation Age of Onset  . Hypertension Mother   . Lung cancer Father   . Colitis Daughter    Family Psychiatric  History: Unaware Social History:  Social History   Substance and Sexual Activity  Alcohol Use Yes  . Alcohol/week: 0.0 standard drinks   Comment: occ     Social History   Substance and Sexual Activity  Drug Use No    Social History   Socioeconomic History  . Marital status: Married    Spouse name: Not on file  . Number of children: 2  . Years of education: Not on file  . Highest education level: Not on file  Occupational History  . Occupation: retired    Associate Professor: RETIRED  Tobacco Use  . Smoking status: Former Smoker    Quit date: 03/20/1969    Years since quitting: 50.9  . Smokeless tobacco: Never Used  Substance and Sexual Activity  . Alcohol use: Yes    Alcohol/week: 0.0 standard drinks    Comment: occ  . Drug use: No  . Sexual activity: Not on file  Other Topics Concern  . Not on file  Social History Narrative  . Not on file   Social Determinants of Health   Financial Resource Strain:   . Difficulty of Paying Living Expenses: Not on file  Food Insecurity:   . Worried About Programme researcher, broadcasting/film/video in the Last Year: Not on file  . Ran Out of Food in the Last Year: Not on file  Transportation Needs:   . Lack of Transportation (Medical): Not on file  . Lack of Transportation (Non-Medical): Not on file  Physical Activity:   . Days of Exercise per Week: Not on file  . Minutes of Exercise per Session: Not on file  Stress:   . Feeling of Stress : Not on file  Social Connections:   . Frequency of Communication with  Friends and Family: Not on file  . Frequency of Social Gatherings with Friends and Family: Not on file  . Attends Religious Services: Not on file  . Active Member of Clubs or Organizations: Not on file  . Attends Banker Meetings: Not on file  . Marital Status: Not on file   Additional Social History:    Allergies:   Allergies  Allergen Reactions  . Chocolate     Itching if he eats too much chocolate   . Ciprofloxacin     Achilles tendon pain   . Lipitor [Atorvastatin]     Muscle aches     Labs:  Results for orders placed or performed during the hospital encounter of 02/08/20 (from the past 48 hour(s))  Hepatic function panel     Status: None   Collection Time: 02/08/20  3:17 PM  Result Value Ref Range   Total Protein 7.0 6.5 - 8.1 g/dL   Albumin 4.3 3.5 - 5.0 g/dL   AST 31 15 - 41 U/L   ALT 22 0 - 44 U/L   Alkaline Phosphatase 51 38 - 126 U/L   Total Bilirubin 0.5 0.3 - 1.2 mg/dL   Bilirubin, Direct 0.2 0.0 - 0.2 mg/dL   Indirect Bilirubin 0.3 0.3 - 0.9 mg/dL    Comment: Performed at [facility], [address], [place], [place] [address]  Ethanol     Status: None   Collection Time: 02/08/20  3:17 PM  Result Value Ref Range   Alcohol, Ethyl (B) <10 <10 mg/dL    Comment: (NOTE) Lowest detectable limit for serum alcohol is 10 mg/dL. For medical purposes only. Performed at [facility], [address] [place], [place] [address]   Rapid urine drug screen (hospital performed)     Status: None   Collection Time: 02/08/20  3:17 PM  Result Value Ref Range   Opiates NONE DETECTED NONE DETECTED   Cocaine NONE DETECTED NONE DETECTED   Benzodiazepines NONE DETECTED NONE DETECTED   Amphetamines NONE DETECTED NONE DETECTED   Tetrahydrocannabinol NONE DETECTED NONE DETECTED   Barbiturates NONE DETECTED NONE DETECTED    Comment: (NOTE) DRUG SCREEN FOR MEDICAL PURPOSES ONLY.  IF CONFIRMATION IS NEEDED FOR ANY PURPOSE,  NOTIFY LAB WITHIN 5 DAYS. LOWEST DETECTABLE LIMITS FOR URINE DRUG SCREEN Drug Class                     Cutoff (ng/mL) Amphetamine and metabolites    1000 Barbiturate and metabolites    200 Benzodiazepine                 200 Tricyclics and metabolites     300 Opiates and metabolites        300 Cocaine and metabolites        300 THC                            50 Performed at [facility], [address] [place], [place] [address]   Respiratory Panel by RT PCR (Flu A&B, Covid) - Nasopharyngeal Swab     Status: None   Collection Time: 02/08/20  7:41 PM   Specimen: Nasopharyngeal Swab  Result Value Ref Range   SARS Coronavirus 2 by RT PCR NEGATIVE NEGATIVE    Comment: (NOTE) SARS-CoV-2 target nucleic acids are NOT DETECTED. The SARS-CoV-2 RNA is generally detectable in upper respiratoy specimens during the acute phase of infection. The lowest concentration of SARS-CoV-2 viral copies this assay can detect is 131 copies/mL. A negative result does not preclude SARS-Cov-2 infection and should not be used as the sole basis for treatment or other patient management decisions. A negative result may occur with  improper specimen collection/handling, submission of specimen other than nasopharyngeal swab, presence of viral mutation(s) within the areas targeted by this assay, and inadequate number of viral copies (<131 copies/mL). A negative result must be combined with clinical observations, patient history, and epidemiological information. The expected result is Negative. Fact Sheet for Patients:  https://www.moore.com/ Fact Sheet for Healthcare Providers:  https://www.young.biz/ This test is not yet ap proved or cleared by the [place] FDA and  has been authorized for detection and/or  diagnosis of SARS-CoV-2 by FDA under an Emergency Use Authorization (EUA). This EUA will remain  in effect (meaning this test can be used) for  the duration of the COVID-19 declaration under Section 564(b)(1) of the Act, 21 U.S.C. section 360bbb-3(b)(1), unless the authorization is terminated or revoked sooner.    Influenza A by PCR NEGATIVE NEGATIVE   Influenza B by PCR NEGATIVE NEGATIVE    Comment: (NOTE) The Xpert Xpress SARS-CoV-2/FLU/RSV assay is intended as an aid in  the diagnosis of influenza from Nasopharyngeal swab specimens and  should not be used as a sole basis for treatment. Nasal washings and  aspirates are unacceptable for Xpert Xpress SARS-CoV-2/FLU/RSV  testing. Fact Sheet for Patients: https://www.moore.com/ Fact Sheet for Healthcare Providers: https://www.young.biz/ This test is not yet approved or cleared by the [place] FDA and  has been authorized for detection and/or diagnosis of SARS-CoV-2 by  FDA under an Emergency Use Authorization (EUA). This EUA will remain  in effect (meaning this test can be used) for the duration of the  Covid-19 declaration under Section 564(b)(1) of the Act, 21  U.S.C. section 360bbb-3(b)(1), unless the authorization is  terminated or revoked. Performed at [facility], [address] [place], [place] [address]     Medications:  No current facility-administered medications for this encounter.   Current Outpatient Medications  Medication Sig Dispense Refill  . AZOPT 1 % ophthalmic suspension Place 1 drop into both eyes 3 (three) times daily.    . bimatoprost (LUMIGAN) 0.01 % SOLN Place 1 drop into both eyes at bedtime.    Marland Kitchen CARTIA XT 300 MG 24 hr capsule TAKE 1 CAPSULE DAILY 90 capsule 3  . cetirizine (ZYRTEC) 10 MG tablet Take 10 mg by mouth daily as needed for allergies.     . Coenzyme Q10 (CO Q 10 PO) Take 1 capsule by mouth 2 (two) times daily.     . cyclobenzaprine (FLEXERIL) 10 MG tablet Take 1 tablet (10 mg total) by mouth 3 (three) times daily as needed for muscle spasms. 60 tablet 1  . fish  oil-omega-3 fatty acids 1000 MG capsule Take 1 g by mouth daily.     . Flaxseed, Linseed, (FLAX SEED OIL PO) Take 1 capsule by mouth 3 (three) times daily.     . Glucosamine-Chondroitin (COSAMIN DS) 500-400 MG CAPS Take 1 tablet by mouth 2 (two) times daily.    . Multiple Vitamins-Minerals (MULTIVITAMIN WITH MINERALS) tablet Take 1 tablet by mouth 2 (two) times daily.     . RHOPRESSA 0.02 % SOLN Place 1 drop into both eyes at bedtime.    Marland Kitchen terazosin (HYTRIN) 2 MG capsule Take 2 capsules (4 mg total) by mouth at bedtime. 120 capsule 0    Musculoskeletal: Strength & Muscle Tone: within normal limits Gait & Station: normal Patient leans: N/A  Psychiatric Specialty Exam: Physical Exam  Vitals reviewed. Musculoskeletal:     Cervical back: Normal range of motion.  Neurological: He is alert.  Psychiatric: His speech is normal. His mood appears anxious. He is actively hallucinating. Thought content is paranoid. Cognition and memory are impaired. He expresses no homicidal and no suicidal ideation.    Review of Systems  Unable to perform ROS: Acuity of condition  Psychiatric/Behavioral: Positive for hallucinations and sleep disturbance (States hard to fall asleep). Self-injury: Denies. Suicidal ideas: Denies.    Blood pressure (!) 155/81, pulse 60, temperature 98.1 F (36.7 C), temperature source Oral, resp. rate 14, SpO2 96 %.There is  no height or weight on file to calculate BMI.  General Appearance: Casual  Eye Contact:  Good  Speech:  Clear and Coherent and Normal Rate  Volume:  Normal  Mood:  Irritable  Affect:  Congruent  Thought Process:  Disorganized  Orientation:  Other:  Person  Thought Content:  Hallucinations: Auditory Visual and Paranoid Ideation  Suicidal Thoughts:  No  Homicidal Thoughts:  No  Memory:  Immediate;   Poor Recent;   Poor Remote;   Poor  Judgement:  Impaired  Insight:  Lacking  Psychomotor Activity:  Normal  Concentration:  Concentration: Poor and  Attention Span: Poor  Recall:  Poor  Fund of Knowledge:  Fair  Language:  Good  Akathisia:  No  Handed:  Right  AIMS (if indicated):     Assets:  Communication Skills Housing Social Support  ADL's:  Intact  Cognition:  WNL  Sleep:        Treatment Plan Summary: Plan Geropsychiatric hospitalization   Disposition: Recommend psychiatric Inpatient admission when medically cleared.  This service was provided via telemedicine using a 2-way, interactive audio and video technology.  Names of all persons participating in this telemedicine service and their role in this encounter. Name: Earleen Newport Role: NP  Name: Dr. Darleene Cleaver Role: Psychiatrist  Name: [name] [name] Role: Patient  Name:  Role:     Earleen Newport, NP 02/09/2020 1:09 PM  Patient seen face-to-face for psychiatric evaluation, chart reviewed and case discussed with the physician extender and developed treatment plan. Reviewed the information documented and agree with the treatment plan. Corena Pilgrim, MD

## 2020-02-10 ENCOUNTER — Telehealth: Payer: Self-pay | Admitting: Family Medicine

## 2020-02-10 ENCOUNTER — Encounter (HOSPITAL_COMMUNITY): Payer: Self-pay | Admitting: Registered Nurse

## 2020-02-10 DIAGNOSIS — R443 Hallucinations, unspecified: Secondary | ICD-10-CM | POA: Diagnosis not present

## 2020-02-10 DIAGNOSIS — F060 Psychotic disorder with hallucinations due to known physiological condition: Secondary | ICD-10-CM | POA: Diagnosis not present

## 2020-02-10 DIAGNOSIS — I10 Essential (primary) hypertension: Secondary | ICD-10-CM | POA: Diagnosis not present

## 2020-02-10 DIAGNOSIS — Z20822 Contact with and (suspected) exposure to covid-19: Secondary | ICD-10-CM | POA: Diagnosis not present
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
## 2020-02-10 NOTE — Telephone Encounter (Signed)
Patient wife stated patient wants to know if Dr Clent Ridges knows that he is in the hospital.  Wonda Olds was supposed to contact Dr. Clent Ridges.

## 2020-02-10 NOTE — ED Notes (Signed)
Pt was given coffee and [place] sandwich

## 2020-02-10 NOTE — BH Assessment (Signed)
BHH Assessment Progress Note This Clinical research associate spoke to patient's wife Capers Hagmann [phone] to gather collateral information this date. Wife states patient symptoms started over one month ago when patient starting telling her that "a family had moved into their home." Wife states patient would at different times of the day report that "some kids were in his room" and would ask his wife to "get them out." Patient's wife described what would seem to be VH as evidenced by wife reporting that patient would "point to different parts of the room" they were in at the time and ask "don't you see them." Patient's wife stated that patient's VH has worsened over the last two weeks with patient attempting to interact with what he was seeing and attempted to "ask them questions" which prompted patient being brought to ED after patient was "walking around the house at night" talking to individuals that were not there. Wife reports that patient has not had any medication changes, SA issues or events that she thought would bring on these behaviors.  Wife is asking to keep her informed with patient updates.

## 2020-02-10 NOTE — BH Assessment (Signed)
BHH Assessment Progress Note  Per Assunta Found, FNP this voluntary pt requires psychiatric hospitalization at this time at a facility providing specialty services for geriatric patients.  Placement will be sought.  Doylene Canning, [place] Behavioral Health Coordinator [phone]

## 2020-02-11 MED ORDER — QUETIAPINE FUMARATE 50 MG PO TABS
50.0000 mg | ORAL_TABLET | Freq: Every day | ORAL | Status: DC
  Administered 2020-02-11: 50 mg via ORAL
  Filled 2020-02-11: qty 1

## 2020-02-11 NOTE — Progress Notes (Addendum)
Received [name] [name] at the change of shift awake in his room with the sitter at the bedside. He was compliant with his medication and eye gtts. He remains cognitively disorganized. He received a snack per his request. He woke up at 0345 and wanted his shirt. He was reoriented to the time and asked to return to his room. He slept until 0600 hrs.

## 2020-02-11 NOTE — BH Assessment (Signed)
BHH Assessment Progress Note  After consulting with pt, Assunta Found, FNP requests that pt's wife, Luqman, Perrelli, come to [facility] to see pt in person to determine if he is at baseline.  Ms Graeff arrives around 14:40, and after visiting with the pt, speaks with this Clinical research associate in person, along with Shuvon by speak phone.  She reports that pt is not reporting any plan or intent to harm himself or anyone else, and he is not experiencing hallucinations at this time, although he is concerned about the possibility of hallucinations returning.  The following plan has been agreed upon by all parties: Pt is to remain at [facility] overnight for further medication management and observation.  Ms Aldridge agrees to pick pt up tomorrow between 11:00 am and 12:00 pm.  Pt will be provided with referral information for outpatient neurologists for follow up at their initiative.  Pt will also be provided with an appointment for intake with an outpatient psychiatrist.  At 15:43 this writer called [employer] and spoke to [place].  She has scheduled pt for an in-person intake appointment on Saturday, 02/15/2020 at 14:45.  This has been included in pt's discharge instructions, along with instruction to call the number provided prior to scheduled date for confirmation, per Adrika's request.  Pt's nurse, Waynetta Sandy, has been notified.  Doylene Canning, [place] Behavioral Health Coordinator [phone]

## 2020-02-11 NOTE — Progress Notes (Signed)
CSW received a call from py's psychiatric NP stating pt's wife is agreeable to picking pt up tomorrow (2/17) as psychiatric placement is taking so long, but that pt's wife is requesting assistance with finding an appt with a neurologist.  CSW provided NP with contact info for the [facility] ED TOC RN CM to request assistance from a medically trained RN.  CSW will follow up with the RN CM to insure continuity of care collaboration.  CSW will continue to follow for D/C needs.  Dorothe Pea. Rickia Freeburg, LCSW, LCAS, CSI Transitions of Care Clinical Social Worker Care Coordination Department Ph: [phone]

## 2020-02-11 NOTE — Discharge Instructions (Signed)
For your behavioral health needs, you are advised to follow up with an outpatient psychiatrist.  You have been scheduled for an intake appointment with Neila Gear, MD on Saturday, February 15, 2020 at 2:45 pm.  This will be an in-person visit.  Please call them prior to the appointment for confirmation:       Neila Gear, MD      [facility]      [address]      [place], [place] [address]      [phone]

## 2020-02-11 NOTE — ED Notes (Signed)
Pt alert this shift. Confused. Disorganized. Pt able to complete own ADLs. Cooperative.

## 2020-02-11 NOTE — ED Notes (Signed)
Asked to call his wife and was able to do so. Pleasant but brief conversation. He is calm and pleasant. Took HS meds without difficulty. He denies any thoughts to hurt self. The hospital plan is for him to go home with his wife tomorrow with community resources.

## 2020-02-11 NOTE — Consult Note (Addendum)
[facility] Psych ED Progress Note  02/11/2020 10:30 AM [name] [name]  MRN:  [id number]    Per TTS Assessment Note: reviewed by this provider:  [name] is a 85 y.o. male patient who was brought to [facility] with his wife at the insistence of his doctor, as pt states he has been experiencing AVH for some years but they have been getting worse over the last month. According to notes by pt's EDP, pt has been seeing people walk throughout his house for the past month; the notes state the people, whom he does not know, stare at him but never say anything. Pt reported during the assessment that there are children that sit down for meals and never talk to him, which he finds is rude. He expresses frustration that his wife is unable to see the same things he is able to see.  02/11/20 Psychiatric Follow up   Subjective:  [name] [name], 85 y.o., male patient seen via tele psych for follow up psychiatric consult by this provider, consulted with Dr. Lucianne Muss; and chart reviewed on 02/11/20.  On evaluation [name] [name] patient is sitting on side of bed.  States that he is doing "Fine"  Reports that sleep "a little shitty but that was expected with all the noise."  States that he is eating without difficulty.  At this time patient states that he is not seeing anything that no one else can.  "Not at this moment, no"  Patient continues to state that he feels better "but things are no better or no worse.  Patient continues to deny suicidal/self-harm/homicidal ideation, and paranoia.   Spoke with patients wife; discussed treatment plan of seeking geropsychiatric hospitalization or that patient may get better before getting accepted to hospital.  Understanding voice.  Informed that patient appears to be doing better but would still need outpatient services to follow up with neurologist and psychiatry.  Patients' wife states that she will come see patient to see if at baseline today; if at baseline willing to take home;  if not will continue to seek geropsyciatric hospitalization.    Principal Problem: Hallucinations Diagnosis:  Principal Problem:   Hallucinations  Total Time spent with patient: 45 minutes  Past Psychiatric History: None  Past Medical History:  Past Medical History:  Diagnosis Date  . Allergy   . Diverticulosis of colon (without mention of hemorrhage) 03-01-2004, 04-04-2011   Colonoscopy  . ED (erectile dysfunction)   . Glaucoma    sees Dr. Arvil Chaco   . Hyperglycemia   . Hyperlipidemia   . Hypertension   . Internal hemorrhoids 03-15-1999   Flex Sig   . Irritable bowel     Past Surgical History:  Procedure Laterality Date  . COLONOSCOPY  04-04-11   per Dr. Jarold Motto, clear, no repeats needed  . EYE SURGERY     bilateral cataract extraction per Dr Jettie Pagan  . INGUINAL HERNIA REPAIR    . KNEE ARTHROSCOPY     right knee  . NASAL SINUS SURGERY     Family History:  Family History  Problem Relation Age of Onset  . Hypertension Mother   . Lung cancer Father   . Colitis Daughter    Family Psychiatric  History: Unaware Social History:  Social History   Substance and Sexual Activity  Alcohol Use Yes  . Alcohol/week: 0.0 standard drinks   Comment: occ     Social History   Substance and Sexual Activity  Drug Use No  Social History   Socioeconomic History  . Marital status: Married    Spouse name: Not on file  . Number of children: 2  . Years of education: Not on file  . Highest education level: Not on file  Occupational History  . Occupation: retired    Associate Professor: RETIRED  Tobacco Use  . Smoking status: Former Smoker    Quit date: 03/20/1969    Years since quitting: 50.9  . Smokeless tobacco: Never Used  Substance and Sexual Activity  . Alcohol use: Yes    Alcohol/week: 0.0 standard drinks    Comment: occ  . Drug use: No  . Sexual activity: Not on file  Other Topics Concern  . Not on file  Social History Narrative  . Not on file   Social Determinants  of Health   Financial Resource Strain:   . Difficulty of Paying Living Expenses: Not on file  Food Insecurity:   . Worried About Programme researcher, broadcasting/film/video in the Last Year: Not on file  . Ran Out of Food in the Last Year: Not on file  Transportation Needs:   . Lack of Transportation (Medical): Not on file  . Lack of Transportation (Non-Medical): Not on file  Physical Activity:   . Days of Exercise per Week: Not on file  . Minutes of Exercise per Session: Not on file  Stress:   . Feeling of Stress : Not on file  Social Connections:   . Frequency of Communication with Friends and Family: Not on file  . Frequency of Social Gatherings with Friends and Family: Not on file  . Attends Religious Services: Not on file  . Active Member of Clubs or Organizations: Not on file  . Attends Banker Meetings: Not on file  . Marital Status: Not on file    Sleep: Fair  Appetite:  Good  Current Medications: Current Facility-Administered Medications  Medication Dose Route Frequency Provider Last Rate Last Admin  . brinzolamide (AZOPT) 1 % ophthalmic suspension 1 drop  1 drop Both Eyes TID Glynn Octave, MD   1 drop at 02/11/20 [phone]  . diltiazem (CARDIZEM CD) 24 hr capsule 300 mg  300 mg Oral Daily Rancour, Stephen, MD   300 mg at 02/11/20 [id number]  . latanoprost (XALATAN) 0.005 % ophthalmic solution 1 drop  1 drop Both Eyes QHS Rancour, Stephen, MD   1 drop at 02/10/20 2213  . loratadine (CLARITIN) tablet 10 mg  10 mg Oral Daily Rancour, Stephen, MD   10 mg at 02/11/20 8638  . Netarsudil Dimesylate 0.02 % SOLN 1 drop  1 drop Both Eyes QHS Rancour, Stephen, MD      . QUEtiapine (SEROQUEL) tablet 50 mg  50 mg Oral QHS Valissa Lyvers B, NP      . terazosin (HYTRIN) capsule 4 mg  4 mg Oral QHS Rancour, Jeannett Senior, MD   4 mg at 02/10/20 2213   Current Outpatient Medications  Medication Sig Dispense Refill  . AZOPT 1 % ophthalmic suspension Place 1 drop into both eyes 3 (three) times daily.    .  bimatoprost (LUMIGAN) 0.01 % SOLN Place 1 drop into both eyes at bedtime.    Marland Kitchen CARTIA XT 300 MG 24 hr capsule TAKE 1 CAPSULE DAILY 90 capsule 3  . cetirizine (ZYRTEC) 10 MG tablet Take 10 mg by mouth daily as needed for allergies.     . Coenzyme Q10 (CO Q 10 PO) Take 1 capsule by mouth 2 (two) times daily.     Marland Kitchen  cyclobenzaprine (FLEXERIL) 10 MG tablet Take 1 tablet (10 mg total) by mouth 3 (three) times daily as needed for muscle spasms. 60 tablet 1  . fish oil-omega-3 fatty acids 1000 MG capsule Take 1 g by mouth daily.     . Flaxseed, Linseed, (FLAX SEED OIL PO) Take 1 capsule by mouth 3 (three) times daily.     . Glucosamine-Chondroitin (COSAMIN DS) 500-400 MG CAPS Take 1 tablet by mouth 2 (two) times daily.    . Multiple Vitamins-Minerals (MULTIVITAMIN WITH MINERALS) tablet Take 1 tablet by mouth 2 (two) times daily.     . RHOPRESSA 0.02 % SOLN Place 1 drop into both eyes at bedtime.    Marland Kitchen terazosin (HYTRIN) 2 MG capsule Take 2 capsules (4 mg total) by mouth at bedtime. 120 capsule 0    Lab Results:  No results found for this or any previous visit (from the past 48 hour(s)).  Blood Alcohol level:  Lab Results  Component Value Date   ETH <10 02/08/2020    Physical Findings: AIMS:  , ,  ,  ,    CIWA:    COWS:     Musculoskeletal: Strength & Muscle Tone: within normal limits Gait & Station: normal Patient leans: N/A  Psychiatric Specialty Exam: Physical Exam  Review of Systems  Blood pressure 120/76, pulse 68, temperature 98.2 F (36.8 C), temperature source Oral, resp. rate 16, SpO2 99 %.There is no height or weight on file to calculate BMI.  General Appearance: Casual  Eye Contact:  Good  Speech:  Clear and Coherent and Normal Rate  Volume:  Normal  Mood:  Anxious  Affect:  Congruent  Thought Process:  Coherent and Descriptions of Associations: Intact  Orientation:  Full (Time, Place, and Person)  Thought Content:  Hallucinations: Visual  Suicidal Thoughts:  No   Homicidal Thoughts:  No  Memory:  Immediate;   Fair Recent;   Fair  Judgement:  Fair  Insight:  Present  Psychomotor Activity:  Normal  Concentration:  Concentration: Fair and Attention Span: Fair  Recall:  [employer] of Knowledge:  Fair  Language:  Good  Akathisia:  No  Handed:  Right  AIMS (if indicated):     Assets:  Communication Skills Desire for Improvement Housing Social Support  ADL's:  Intact  Cognition:  WNL  Sleep:       Treatment Plan Summary: Daily contact with patient to assess and evaluate symptoms and progress in treatment, Medication management and Plan Psychiatric hospitalization   CT Scan: Head w/o contrast 02/08/2020 IMPRESSION:  1. Chronic sinus disease.  2. No acute intracranial process.   MRI:  Head w/o contrast 02/08/2020 IMPRESSION:  No evidence of acute intracranial abnormality.  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 No mass effect upon the  underlying right cerebral hemisphere.  Moderate generalized parenchymal atrophy and chronic small vessel  ischemic disease.  Paranasal sinus disease as described.   ECG Results 02/11/2020 Vent. Rate   76            BPM  Normal sinus rhythm PR interval        186          ms  Right bundle branch block QRS duration    168          ms  Left anterior fascicular block QT/QTc    424/477   ms  ---  Bifascicular block --- P-R-T axes    85 -52     80  Abnormal ECG                When compared with ECG of 11/06/2019,            No significant change was found   Medication management:  Patient started on Seroquel 50 mg Q hs    Disposition:  Will continue to seek geropsychiatric hospitalization or discharge home if patient becomes stabilized and safe to return home with outpatient service follow up;  which ever comes first.    Earleen Newport, NP 02/11/2020, 10:30 AM    Addendum:  02/11/2020 at 3:42 pm  Patient wife visited at hospital and states that patient told her that he continues to see things.  Asked her if the people he was seeing was still at their home.  Wife states that patient was never a danger to himself or others she just wanted him to stop seeing things.  States at home he would ask her about seeing someone and tell her to tell them to leave or stating that it was a nuance having someone in the house all the time of about him feeling crowded with others in the home.  Discussed with patient wife if she wanted him home or if she felt that it would be best for patient to stay in hospital and we continue to look for geropsychiatry placement.  States that it was no bother at home but would like for him to see a neurologist and have psychiatric services.  Informed that we could get those things set up for her.  States that she could pick him up tomorrow around 11 or 12 noon.   Edwin Cap Shavers (case [phone] Marylou Flesher (social worker) [phone] given information to contact neurology on call (for a referral) [phone].  Once referral entered Edwin Cap stated that she could then set up an appointment.  Jalene Mullet will set up an appointment for psychiatry.   Referral ordered for (on call) [facility]: Left message with patient information and return call to [facility] with referral so she could set up an appointment.    Disposition:  Patient psychiatrically cleared for discharge tomorrow 02/12/2020 to follow up Neurologist and psychiatrist.

## 2020-02-12 ENCOUNTER — Encounter (HOSPITAL_COMMUNITY): Payer: Self-pay | Admitting: Registered Nurse

## 2020-02-12 MED ORDER — QUETIAPINE FUMARATE 50 MG PO TABS: 50.0000 mg | ORAL_TABLET | Freq: Every day | ORAL | 0 refills | Status: DC

## 2020-02-12 NOTE — ED Provider Notes (Signed)
Vitals:   02/11/20 2120 02/12/20 0653  BP: (!) 169/79 131/74  Pulse: 72 63  Resp: 18 18  Temp: 98.2 F (36.8 C) 97.9 F (36.6 C)  SpO2: 99% 98%   Plan is for dc this am.  Pt is sleeping. No acute issues   Linwood Dibbles, MD 02/12/20 [phone]

## 2020-02-12 NOTE — Consult Note (Signed)
[facility] Psych ED Discharge  02/12/2020 11:36 AM [name] [name]  MRN:  [id number] Principal Problem: Hallucinations Discharge Diagnoses: Principal Problem:   Hallucinations   Subjective: [name] [name], 85 y.o., male patient seen via tele psych by this provider, Dr. Lucianne Muss; and chart reviewed on 02/12/20.  On evaluation [name] [name] reports states that feels fine.  States that he is not getting the proper sleep is making him feel worse.  "I've been in here I think since Sunday at least 3 days; and I haven't been getting my proper sleep.  I'm starting to feel worse cause I'm not sleeping."  States that he is not sleeping properly because of all of the noise.  Patient continues to deny suicidal/self-harm/homicidal ideation, and paranoia.   States that he has not seen anything that no one else could for 2 days.  Patient states he is agreeable to going home and following up with psychiatry and neurology.  Patient wife is to pick him up to day.  During evaluation [name] [name] is alert/oriented x 4; calm/cooperative; and mood is congruent with affect.  He does not appear to be responding to internal/external stimuli or delusional thoughts.  Patient denies suicidal/self-harm/homicidal ideation, psychosis, and paranoia.  Patient answered question appropriately.  Appointments have been set for follow up with Neurology and Psychiatry; see below.       Total Time spent with patient: 30 minutes  Past Psychiatric History: None  Past Medical History:  Past Medical History:  Diagnosis Date  . Allergy   . Diverticulosis of colon (without mention of hemorrhage) 03-01-2004, 04-04-2011   Colonoscopy  . ED (erectile dysfunction)   . Glaucoma    sees Dr. Arvil Chaco   . Hyperglycemia   . Hyperlipidemia   . Hypertension   . Internal hemorrhoids 03-15-1999   Flex Sig   . Irritable bowel     Past Surgical History:  Procedure Laterality Date  . COLONOSCOPY  04-04-11   per Dr. Jarold Motto, clear, no repeats  needed  . EYE SURGERY     bilateral cataract extraction per Dr Jettie Pagan  . INGUINAL HERNIA REPAIR    . KNEE ARTHROSCOPY     right knee  . NASAL SINUS SURGERY     Family History:  Family History  Problem Relation Age of Onset  . Hypertension Mother   . Lung cancer Father   . Colitis Daughter    Family Psychiatric  History: None Social History:  Social History   Substance and Sexual Activity  Alcohol Use Yes  . Alcohol/week: 0.0 standard drinks   Comment: occ     Social History   Substance and Sexual Activity  Drug Use No    Social History   Socioeconomic History  . Marital status: Married    Spouse name: Not on file  . Number of children: 2  . Years of education: Not on file  . Highest education level: Not on file  Occupational History  . Occupation: retired    Associate Professor: RETIRED  Tobacco Use  . Smoking status: Former Smoker    Quit date: 03/20/1969    Years since quitting: 50.9  . Smokeless tobacco: Never Used  Substance and Sexual Activity  . Alcohol use: Yes    Alcohol/week: 0.0 standard drinks    Comment: occ  . Drug use: No  . Sexual activity: Not on file  Other Topics Concern  . Not on file  Social History Narrative  . Not on file   Social  Determinants of Health   Financial Resource Strain:   . Difficulty of Paying Living Expenses: Not on file  Food Insecurity:   . Worried About Programme researcher, broadcasting/film/video in the Last Year: Not on file  . Ran Out of Food in the Last Year: Not on file  Transportation Needs:   . Lack of Transportation (Medical): Not on file  . Lack of Transportation (Non-Medical): Not on file  Physical Activity:   . Days of Exercise per Week: Not on file  . Minutes of Exercise per Session: Not on file  Stress:   . Feeling of Stress : Not on file  Social Connections:   . Frequency of Communication with Friends and Family: Not on file  . Frequency of Social Gatherings with Friends and Family: Not on file  . Attends Religious Services: Not  on file  . Active Member of Clubs or Organizations: Not on file  . Attends Banker Meetings: Not on file  . Marital Status: Not on file    Has this patient used any form of tobacco in the last 30 days? (Cigarettes, Smokeless Tobacco, Cigars, and/or Pipes) Prescription not provided because: Does not use tobacco products  Current Medications: Current Facility-Administered Medications  Medication Dose Route Frequency Provider Last Rate Last Admin  . brinzolamide (AZOPT) 1 % ophthalmic suspension 1 drop  1 drop Both Eyes TID Rancour, Stephen, MD   1 drop at 02/12/20 1038  . diltiazem (CARDIZEM CD) 24 hr capsule 300 mg  300 mg Oral Daily Rancour, Stephen, MD   300 mg at 02/12/20 1037  . latanoprost (XALATAN) 0.005 % ophthalmic solution 1 drop  1 drop Both Eyes QHS Rancour, Stephen, MD   1 drop at 02/11/20 2118  . loratadine (CLARITIN) tablet 10 mg  10 mg Oral Daily Rancour, Stephen, MD   10 mg at 02/12/20 1037  . Netarsudil Dimesylate 0.02 % SOLN 1 drop  1 drop Both Eyes QHS Rancour, Stephen, MD      . QUEtiapine (SEROQUEL) tablet 50 mg  50 mg Oral QHS Riann Oman B, NP   50 mg at 02/11/20 2117  . terazosin (HYTRIN) capsule 4 mg  4 mg Oral QHS Rancour, Jeannett Senior, MD   4 mg at 02/11/20 2118   Current Outpatient Medications  Medication Sig Dispense Refill  . AZOPT 1 % ophthalmic suspension Place 1 drop into both eyes 3 (three) times daily.    . bimatoprost (LUMIGAN) 0.01 % SOLN Place 1 drop into both eyes at bedtime.    Marland Kitchen CARTIA XT 300 MG 24 hr capsule TAKE 1 CAPSULE DAILY 90 capsule 3  . cetirizine (ZYRTEC) 10 MG tablet Take 10 mg by mouth daily as needed for allergies.     . Coenzyme Q10 (CO Q 10 PO) Take 1 capsule by mouth 2 (two) times daily.     . fish oil-omega-3 fatty acids 1000 MG capsule Take 1 g by mouth daily.     . Glucosamine-Chondroitin (COSAMIN DS) 500-400 MG CAPS Take 1 tablet by mouth 2 (two) times daily.    . Multiple Vitamins-Minerals (MULTIVITAMIN WITH MINERALS)  tablet Take 1 tablet by mouth 2 (two) times daily.     . RHOPRESSA 0.02 % SOLN Place 1 drop into both eyes at bedtime.    Marland Kitchen terazosin (HYTRIN) 2 MG capsule Take 2 capsules (4 mg total) by mouth at bedtime. 120 capsule 0  . QUEtiapine (SEROQUEL) 50 MG tablet Take 1 tablet (50 mg total) by mouth at  bedtime. 30 tablet 0   PTA Medications: (Not in a hospital admission)   Musculoskeletal: Strength & Muscle Tone: within normal limits Gait & Station: normal Patient leans: N/A  Psychiatric Specialty Exam: Physical Exam  Review of Systems  Blood pressure 131/74, pulse 63, temperature 97.9 F (36.6 C), temperature source Oral, resp. rate 18, SpO2 98 %.There is no height or weight on file to calculate BMI.  General Appearance: Casual  Eye Contact:  Good  Speech:  Clear and Coherent and Normal Rate  Volume:  Normal  Mood:  "Good" Appropriate  Affect:  Appropriate and Congruent  Thought Process:  Coherent, Goal Directed and Descriptions of Associations: Intact  Orientation:  Full (Time, Place, and Person)  Thought Content:  WDL, Logical and Denies visual hallucinations at this time.  Did not see patient looking around room and if looking at someone else as he had before  Suicidal Thoughts:  No  Homicidal Thoughts:  No  Memory:  Immediate;   Improved Recent;   Poor  Judgement:  Fair  Insight:  Present  Psychomotor Activity:  Normal  Concentration:  Concentration: Fair and Attention Span: Fair  Recall:  Improved  Fund of Knowledge:  Good  Language:  Good  Akathisia:  No  Handed:  Right  AIMS (if indicated):     Assets:  Communication Skills Desire for Improvement Financial Resources/Insurance Housing Social Support Transportation  ADL's:  Intact  Cognition:  WNL  Sleep:        Demographic Factors:  Male and Age 1 or older  Loss Factors: None  Historical Factors: None  Risk Reduction Factors:   Sense of responsibility to family, Religious beliefs about death, Living  with another person, especially a relative, Positive social support and Positive coping skills or problem solving skills  Continued Clinical Symptoms:  Late onset psychosis  Cognitive Features That Contribute To Risk:  None    Suicide Risk:  Minimal: No identifiable suicidal ideation.  Patients presenting with no risk factors but with morbid ruminations; may be classified as minimal risk based on the severity of the depressive symptoms  Follow-up Information    [facility] Follow up.   Contact information: [address]     [place] [phone]          Discharge Instructions     For your behavioral health needs, you are advised to follow up with an outpatient psychiatrist.  You have been scheduled for an intake appointment with Leanord Hawking, MD on Saturday, February 15, 2020 at 2:45 pm.  This will be an in-person visit.  Please call them prior to the appointment for confirmation:       Leanord Hawking, MD      [facility]      [address] [place], [place] [address]      [phone]     Plan Of Care/Follow-up recommendations:  Activity:  As tolerated Diet:  Heart healthy  Disposition:  Patient psychiatrically cleared to follow up with resources given; see above No evidence of imminent risk to self or others at present.   Patient does not meet criteria for psychiatric inpatient admission. Supportive therapy provided about ongoing stressors. Discussed crisis plan, support from social network, calling 911, coming to the Emergency Department, and calling Suicide Hotline.   Houston Zapien, NP 02/12/2020, 11:36 AM

## 2020-02-12 NOTE — ED Notes (Signed)
Awake for trying to find way to bathroom. Required stern verbal direction from sitter to urinate in the bathroom rather than the floor. Confused and irritable. Took him about 20 min and other staff involvement to settle him down. Arguing with tech, trying to open door to the anteroom and to come out of his room. He knew people the male tech knew from church and it helped him settle back into bed and return to sleep.

## 2020-02-12 NOTE — ED Notes (Signed)
Pt discharged safely with Wife.  Discharge instructions were reviewed.

## 2020-02-12 NOTE — Progress Notes (Signed)
TOC CM received referral to arrange appt with [facility] Neu[facility]. Spoke to scheduler and they did receive new referral to schedule appt. They will contact pt or wife to schedule appt. Office prefers to schedule appt with pt. Referring ED provider updated. Isidoro Donning RN CCM, [facility] ED TOC CM [phone]

## 2020-02-16 ENCOUNTER — Ambulatory Visit: Payer: Medicare Other | Attending: Internal Medicine

## 2020-02-16 DIAGNOSIS — Z23 Encounter for immunization: Secondary | ICD-10-CM | POA: Insufficient documentation

## 2020-02-16 NOTE — Progress Notes (Signed)
   Covid-19 Vaccination Clinic  Name:  [name] [name]    MRN: [id number] DOB: April 18, 1935  02/16/2020  [name] was observed post Covid-19 immunization for 15 minutes without incidence. He was provided with Vaccine Information Sheet and instruction to access the V-Safe system.   [name] was instructed to call 911 with any severe reactions post vaccine: Marland Kitchen Difficulty breathing  . Swelling of your face and throat  . A fast heartbeat  . A bad rash all over your body  . Dizziness and weakness    Immunizations Administered    Name Date Dose VIS Date Route   Pfizer COVID-19 Vaccine 02/16/2020  3:35 PM 0.3 mL 12/06/2019 Intramuscular   Manufacturer: [employer], [employer]   Lot: [id number]   NDC: [address]-0

## 2020-02-17 ENCOUNTER — Telehealth: Payer: Self-pay | Admitting: Family Medicine

## 2020-02-17 DIAGNOSIS — I10 Essential (primary) hypertension: Secondary | ICD-10-CM

## 2020-02-17 MED ORDER — TERAZOSIN HCL 2 MG PO CAPS: 4.0000 mg | ORAL_CAPSULE | Freq: Every day | ORAL | 0 refills | Status: DC

## 2020-02-17 NOTE — Telephone Encounter (Signed)
Pt is calling in stating that [facility] has been trying to get in touch with the office to get terazosin.  Pt is out of the medication.  Pharm:  [facility] mail order.

## 2020-02-17 NOTE — Telephone Encounter (Signed)
Rx has been sent in. Left a detailed message on verified voice mail.   

## 2020-02-21 ENCOUNTER — Telehealth: Payer: Self-pay | Admitting: Family Medicine

## 2020-02-21 NOTE — Telephone Encounter (Signed)
Pt's wife([name]) would like to know if we have samples of Terazosin. A prescription was called on 02/17/20 to [facility] and has no pills for this weekend. Thanks

## 2020-02-24 ENCOUNTER — Other Ambulatory Visit: Payer: Self-pay

## 2020-02-25 ENCOUNTER — Ambulatory Visit (INDEPENDENT_AMBULATORY_CARE_PROVIDER_SITE_OTHER): Payer: Medicare Other | Admitting: Family Medicine

## 2020-02-25 ENCOUNTER — Encounter: Payer: Self-pay | Admitting: Family Medicine

## 2020-02-25 VITALS — BP 120/62 | HR 79 | Temp 97.7°F | Ht 72.0 in | Wt 189.0 lb

## 2020-02-25 DIAGNOSIS — I10 Essential (primary) hypertension: Secondary | ICD-10-CM | POA: Diagnosis not present

## 2020-02-25 DIAGNOSIS — Z0000 Encounter for general adult medical examination without abnormal findings: Secondary | ICD-10-CM

## 2020-02-25 MED ORDER — RISPERIDONE 0.5 MG PO TABS: 0.5000 mg | ORAL_TABLET | Freq: Every day | ORAL | 0 refills | Status: DC

## 2020-02-25 MED ORDER — DONEPEZIL HCL 5 MG PO TABS: 5.0000 mg | ORAL_TABLET | Freq: Every day | ORAL | 0 refills | Status: DC

## 2020-02-25 MED ORDER — TERAZOSIN HCL 2 MG PO CAPS: 4.0000 mg | ORAL_CAPSULE | Freq: Every day | ORAL | 3 refills | Status: DC

## 2020-02-25 NOTE — Progress Notes (Signed)
Subjective:    Patient ID: [name] [name], male    DOB: [birth year], 85 y.o.   MRN: [id number]  HPI Here with his wife for a well exam. He feels well in general. He has been having visual hallucinations and after a visit to the ER, he was referred to a psychiatrist named Dr. Marilynne Drivers (Dr. Maggie Schwalbe). He had been started on Seroquel at the ER, but Dr. Maggie Schwalbe stopped this and changed him to Risperidone and Donepezil at bedtime. This has reduced the frequency of the hallucinations but they continue. His wife says he now sleeps through the night. He had fastin labs done last month which were remarkable only for mildly elevated lipids.    Review of Systems  Constitutional: Negative.   HENT: Negative.   Eyes: Negative.   Respiratory: Negative.   Cardiovascular: Negative.   Gastrointestinal: Negative.   Genitourinary: Negative.   Musculoskeletal: Negative.   Skin: Negative.   Neurological: Negative.   Psychiatric/Behavioral: Positive for hallucinations. Negative for agitation and dysphoric mood. The patient is not nervous/anxious.        Objective:   Physical Exam Constitutional:      General: He is not in acute distress.    Appearance: He is well-developed. He is not diaphoretic.  HENT:     Head: Normocephalic and atraumatic.     Right Ear: External ear normal.     Left Ear: External ear normal.     Nose: Nose normal.     Mouth/Throat:     Pharynx: No oropharyngeal exudate.  Eyes:     General: No scleral icterus.       Right eye: No discharge.        Left eye: No discharge.     Conjunctiva/sclera: Conjunctivae normal.     Pupils: Pupils are equal, round, and reactive to light.  Neck:     Thyroid: No thyromegaly.     Vascular: No JVD.     Trachea: No tracheal deviation.  Cardiovascular:     Rate and Rhythm: Normal rate and regular rhythm.     Heart sounds: Normal heart sounds. No murmur. No friction rub. No gallop.   Pulmonary:     Effort: Pulmonary effort is normal. No  respiratory distress.     Breath sounds: Normal breath sounds. No wheezing or rales.  Chest:     Chest wall: No tenderness.  Abdominal:     General: Bowel sounds are normal. There is no distension.     Palpations: Abdomen is soft. There is no mass.     Tenderness: There is no abdominal tenderness. There is no guarding or rebound.  Genitourinary:    Penis: Normal. No tenderness.      Testes: Normal.     Prostate: Normal.     Rectum: Normal. Guaiac result negative.  Musculoskeletal:        General: No tenderness. Normal range of motion.     Cervical back: Neck supple.  Lymphadenopathy:     Cervical: No cervical adenopathy.  Skin:    General: Skin is warm and dry.     Coloration: Skin is not pale.     Findings: No erythema or rash.  Neurological:     Mental Status: He is alert and oriented to person, place, and time.     Cranial Nerves: No cranial nerve deficit.     Motor: No abnormal muscle tone.     Coordination: Coordination normal.     Deep Tendon Reflexes: Reflexes are  normal and symmetric. Reflexes normal.  Psychiatric:        Behavior: Behavior normal.        Thought Content: Thought content normal.        Judgment: Judgment normal.           Assessment & Plan:  Well exam. We discussed diet and exercise. He is up to date on immunizations, and he has received the first Covid vaccine. He will follow up with Dr. Altamese Luckey for the hallucinations on 03-03-20.  Alysia Penna, MD

## 2020-03-11 ENCOUNTER — Ambulatory Visit: Payer: Medicare Other | Attending: Internal Medicine

## 2020-03-11 DIAGNOSIS — Z23 Encounter for immunization: Secondary | ICD-10-CM

## 2020-03-11 NOTE — Progress Notes (Signed)
   Covid-19 Vaccination Clinic  Name:  [name] [name]    MRN: [id number] DOB: [birth year]  03/11/2020  [name] [name] was observed post Covid-19 immunization for 15 minutes without incident. He was provided with Vaccine Information Sheet and instruction to access the V-Safe system.   [name] [name] was instructed to call 911 with any severe reactions post vaccine: Marland Kitchen Difficulty breathing  . Swelling of face and throat  . A fast heartbeat  . A bad rash all over body  . Dizziness and weakness   Immunizations Administered    Name Date Dose VIS Date Route   Pfizer COVID-19 Vaccine 03/11/2020  1:18 PM 0.3 mL 12/06/2019 Intramuscular   Manufacturer: [employer], [employer]   Lot: [id number]   NDC: [address]-5

## 2020-04-10 ENCOUNTER — Telehealth: Payer: Self-pay | Admitting: Family Medicine

## 2020-04-10 ENCOUNTER — Other Ambulatory Visit: Payer: Self-pay | Admitting: Family Medicine

## 2020-04-10 NOTE — Telephone Encounter (Signed)
Medication Refill: Diltiazem  Pharmacy: [facility] FAX: [phone]   Pt states he has 14 days left

## 2020-04-10 NOTE — Telephone Encounter (Signed)
Please advise. Rx is not on the current med list 

## 2020-04-10 NOTE — Telephone Encounter (Signed)
Rx is not on current med list. Rf request from the pharmacy was sent to Dr. Clent Ridges.

## 2020-04-23 ENCOUNTER — Other Ambulatory Visit: Payer: Self-pay

## 2020-04-24 ENCOUNTER — Telehealth: Payer: Medicare Other | Admitting: Family Medicine

## 2020-06-17 ENCOUNTER — Ambulatory Visit (INDEPENDENT_AMBULATORY_CARE_PROVIDER_SITE_OTHER): Payer: Medicare Other | Admitting: Podiatry

## 2020-06-17 ENCOUNTER — Encounter: Payer: Self-pay | Admitting: Podiatry

## 2020-06-17 ENCOUNTER — Other Ambulatory Visit: Payer: Self-pay

## 2020-06-17 VITALS — BP 156/86 | HR 66

## 2020-06-17 DIAGNOSIS — B351 Tinea unguium: Secondary | ICD-10-CM | POA: Diagnosis not present

## 2020-06-17 DIAGNOSIS — M79675 Pain in left toe(s): Secondary | ICD-10-CM | POA: Diagnosis not present

## 2020-06-17 DIAGNOSIS — M79674 Pain in right toe(s): Secondary | ICD-10-CM | POA: Diagnosis not present

## 2020-06-17 DIAGNOSIS — R600 Localized edema: Secondary | ICD-10-CM | POA: Diagnosis not present

## 2020-06-17 NOTE — Patient Instructions (Signed)

## 2020-06-21 NOTE — Progress Notes (Signed)
Subjective: [name] [name] presents today referred by [name], MD for complaint of painful mycotic nails b/l that are difficult to trim. Pain interferes with ambulation. Aggravating factors include wearing enclosed shoe gear. Pain is relieved with periodic professional debridement.  Past Medical History:  Diagnosis Date  . Allergy   . Diverticulosis of colon (without mention of hemorrhage) 03-01-2004, 04-04-2011   Colonoscopy  . ED (erectile dysfunction)   . Glaucoma    sees Dr. Arvil Chaco   . Hyperglycemia   . Hyperlipidemia   . Hypertension   . Internal hemorrhoids 03-15-1999   Flex Sig   . Irritable bowel      Patient Active Problem List   Diagnosis Date Noted  . Hallucinations 02/09/2020  . Abdominal pain, acute 08/21/2014  . Enteritis 08/20/2014  . Abdominal pain 08/20/2014  . GI bleeding 05/26/2012  . Renal mass 05/26/2012  . Diverticulosis of colon with hemorrhage 05/26/2012  . Acute posthemorrhagic anemia 05/26/2012  . SHOULDER PAIN, BILATERAL 07/30/2010  . HERPES SIMPLEX INFECTION 10/10/2007  . TACHYCARDIA, PAROXYSMAL NOS 10/10/2007  . EOSINOPHILIA 07/17/2007  . Dyslipidemia 07/11/2007  . Essential hypertension 07/11/2007  . ALLERGIC RHINITIS 07/11/2007     Past Surgical History:  Procedure Laterality Date  . COLONOSCOPY  04-04-11   per Dr. Jarold Motto, clear, no repeats needed  . EYE SURGERY     bilateral cataract extraction per Dr Jettie Pagan  . INGUINAL HERNIA REPAIR    . KNEE ARTHROSCOPY     right knee  . NASAL SINUS SURGERY       Current Outpatient Medications on File Prior to Visit  Medication Sig Dispense Refill  . AZOPT 1 % ophthalmic suspension Place 1 drop into both eyes 3 (three) times daily.    . bimatoprost (LUMIGAN) 0.01 % SOLN Place 1 drop into both eyes at bedtime.    . cetirizine (ZYRTEC) 10 MG tablet Take 10 mg by mouth daily as needed for allergies.     . Coenzyme Q10 (CO Q 10 PO) Take 1 capsule by mouth 2 (two) times daily.     Marland Kitchen  diltiazem (CARDIZEM CD) 300 MG 24 hr capsule TAKE 1 CAPSULE DAILY 90 capsule 3  . donepezil (ARICEPT) 5 MG tablet Take 1 tablet (5 mg total) by mouth at bedtime. 1 tablet 0  . fish oil-omega-3 fatty acids 1000 MG capsule Take 1 g by mouth daily.     . Glucosamine-Chondroitin (COSAMIN DS) 500-400 MG CAPS Take 1 tablet by mouth 2 (two) times daily.    . Multiple Vitamins-Minerals (MULTIVITAMIN WITH MINERALS) tablet Take 1 tablet by mouth 2 (two) times daily.     . RHOPRESSA 0.02 % SOLN Place 1 drop into both eyes at bedtime.    . risperiDONE (RISPERDAL) 0.5 MG tablet Take 1 tablet (0.5 mg total) by mouth at bedtime. 1 tablet 0  . terazosin (HYTRIN) 2 MG capsule Take 2 capsules (4 mg total) by mouth at bedtime. 180 capsule 3   No current facility-administered medications on file prior to visit.     Allergies  Allergen Reactions  . Chocolate     Itching if he eats too much chocolate   . Ciprofloxacin     Achilles tendon pain   . Lipitor [Atorvastatin]     Muscle aches      Social History   Occupational History  . Occupation: retired    Associate Professor: RETIRED  Tobacco Use  . Smoking status: Former Smoker    Quit date: 03/20/1969  Years since quitting: 51.2  . Smokeless tobacco: Never Used  Vaping Use  . Vaping Use: Never used  Substance and Sexual Activity  . Alcohol use: Yes    Alcohol/week: 0.0 standard drinks    Comment: occ  . Drug use: No  . Sexual activity: Not on file     Family History  Problem Relation Age of Onset  . Hypertension Mother   . Lung cancer Father   . Colitis Daughter      Immunization History  Administered Date(s) Administered  . Fluad Quad(high Dose 65+) 09/25/2019  . Influenza Split 10/18/2011, 10/22/2012  . Influenza Whole 10/16/2007, 09/11/2008, 09/17/2009, 09/23/2010  . Influenza, High Dose Seasonal PF 09/16/2015, 10/12/2016, 10/06/2017, 09/24/2018  . Influenza,inj,Quad PF,6+ Mos 09/16/2013, 10/15/2014  . PFIZER SARS-COV-2 Vaccination  02/16/2020, 03/11/2020  . Pneumococcal Conjugate-13 02/11/2016  . Pneumococcal Polysaccharide-23 12/26/2002, 10/18/2011  . Td 12/26/2002  . Tdap 02/11/2016     Objective: [name] [name] is a/an 85 y.o. male WD, WN in NAD.Marland Kitchen AAO x 3. Vitals:   06/17/20 1442  BP: (!) 156/86  Pulse: 66    Vascular Examination:  Capillary refill time to digits immediate b/l. Palpable PT pulse(s) b/l lower extremities DP pulse faintly palpable left foot. DP pulse palpable right foot. Pedal hair present. Lower extremity skin temperature gradient within normal limits. No pain with calf compression b/l. +1 pitting edema left lower extremity.  Dermatological Examination: Pedal skin with normal turgor, texture and tone bilaterally. No open wounds bilaterally. No interdigital macerations bilaterally. Toenails 1-5 b/l elongated, discolored, dystrophic, thickened, crumbly with subungual debris and tenderness to dorsal palpation.  Musculoskeletal: Normal muscle strength 5/5 to all lower extremity muscle groups bilaterally. No pain crepitus or joint limitation noted with ROM b/l. No gross bony deformities bilaterally.  Neurological: Protective sensation intact 5/5 intact bilaterally with 10g monofilament b/l. Vibratory sensation intact b/l. Proprioception intact bilaterally. Achilles reflex 2+ b/l.  Assessment: 1. Pain due to onychomycosis of toenails of both feet   2. Localized edema     Plan: -Examined patient. -Toenails 1-5 b/l were debrided in length and girth with sterile nail nippers and dremel without iatrogenic bleeding.  -Patient to report any pedal injuries to medical professional immediately. -Patient to continue soft, supportive shoe gear daily. -Patient/POA to call should there be question/concern in the interim.  Return in about 3 months (around 09/17/2020) for nail trim.

## 2020-07-13 ENCOUNTER — Encounter: Payer: Self-pay | Admitting: Family Medicine

## 2020-07-13 ENCOUNTER — Other Ambulatory Visit: Payer: Self-pay

## 2020-07-13 ENCOUNTER — Ambulatory Visit (INDEPENDENT_AMBULATORY_CARE_PROVIDER_SITE_OTHER): Payer: Medicare Other | Admitting: Family Medicine

## 2020-07-13 VITALS — BP 120/64 | HR 65 | Temp 98.3°F | Wt 183.4 lb

## 2020-07-13 DIAGNOSIS — M25472 Effusion, left ankle: Secondary | ICD-10-CM

## 2020-07-13 DIAGNOSIS — M25471 Effusion, right ankle: Secondary | ICD-10-CM | POA: Diagnosis not present

## 2020-07-13 NOTE — Progress Notes (Signed)
   Subjective:    Patient ID: [name] [name], male    DOB: 28-Dec-1934, 85 y.o.   MRN: [id number]  HPI Here with his wife for swelling in both ankles. This has been present for a few months. This is no uncomfortable to him. No SOB. His last creatinine in February was normal.    Review of Systems  Constitutional: Negative.   Respiratory: Negative.   Cardiovascular: Positive for leg swelling. Negative for chest pain and palpitations.       Objective:   Physical Exam Constitutional:      Appearance: Normal appearance.  Cardiovascular:     Rate and Rhythm: Normal rate and regular rhythm.     Pulses: Normal pulses.     Heart sounds: Normal heart sounds.  Pulmonary:     Effort: Pulmonary effort is normal.     Breath sounds: Normal breath sounds.  Musculoskeletal:     Comments: 1+ edema in both ankles   Neurological:     Mental Status: He is alert.           Assessment & Plan:  He has slight ankle edema. No evidence of cardiac or renal issues. Since this is not bothering him, we agreed to observe this only for now. Recheck if it gets worse. He can elevated his feet when possible. Gershon Crane, MD

## 2020-08-25 ENCOUNTER — Other Ambulatory Visit: Payer: Self-pay

## 2020-08-25 ENCOUNTER — Telehealth: Payer: Self-pay | Admitting: Family Medicine

## 2020-08-25 ENCOUNTER — Ambulatory Visit: Payer: Medicare Other

## 2020-08-25 NOTE — Telephone Encounter (Signed)
Called to do Medicare Wellness visit with patient and his wife informed me that they would need to reschedule the appointment as she had forgot the appointment was today. She declined to reschedule today but stated that she would call back to the office and schedule

## 2020-09-04 ENCOUNTER — Encounter: Payer: Self-pay | Admitting: Family Medicine

## 2020-09-04 DIAGNOSIS — R059 Cough, unspecified: Secondary | ICD-10-CM

## 2020-09-04 NOTE — Telephone Encounter (Signed)
I wold continue the Mucinex as long as it takes until the cough subsides

## 2020-09-07 NOTE — Telephone Encounter (Signed)
Agreed. I put in the order for a CXR at the [facility] site, no appt needed

## 2020-09-08 ENCOUNTER — Other Ambulatory Visit: Payer: Self-pay

## 2020-09-08 ENCOUNTER — Ambulatory Visit (INDEPENDENT_AMBULATORY_CARE_PROVIDER_SITE_OTHER)
Admission: RE | Admit: 2020-09-08 | Discharge: 2020-09-08 | Disposition: A | Discharge status: Home / Self Care | Payer: Medicare Other | Source: Ambulatory Visit | Attending: Family Medicine | Admitting: Family Medicine

## 2020-09-08 DIAGNOSIS — R05 Cough: Secondary | ICD-10-CM | POA: Diagnosis not present

## 2020-09-08 DIAGNOSIS — R059 Cough, unspecified: Secondary | ICD-10-CM

## 2020-09-23 ENCOUNTER — Encounter: Payer: Self-pay | Admitting: Podiatry

## 2020-09-23 ENCOUNTER — Ambulatory Visit (INDEPENDENT_AMBULATORY_CARE_PROVIDER_SITE_OTHER): Payer: Medicare Other | Admitting: Podiatry

## 2020-09-23 ENCOUNTER — Other Ambulatory Visit: Payer: Self-pay

## 2020-09-23 DIAGNOSIS — B351 Tinea unguium: Secondary | ICD-10-CM | POA: Diagnosis not present

## 2020-09-23 DIAGNOSIS — M79675 Pain in left toe(s): Secondary | ICD-10-CM

## 2020-09-23 DIAGNOSIS — M79674 Pain in right toe(s): Secondary | ICD-10-CM

## 2020-09-24 NOTE — Progress Notes (Signed)
Subjective:  Patient ID: [name] [name], male    DOB: 1935/02/01,  MRN: [id number]  85 y.o. male presents with painful thick toenails that are difficult to trim. Pain interferes with ambulation. Aggravating factors include wearing enclosed shoe gear. Pain is relieved with periodic professional debridement..    Review of Systems: Negative except as noted in the HPI.  Past Medical History:  Diagnosis Date  . Allergy   . Diverticulosis of colon (without mention of hemorrhage) 03-01-2004, 04-04-2011   Colonoscopy  . ED (erectile dysfunction)   . Glaucoma    sees Dr. Arvil Chaco   . Hyperglycemia   . Hyperlipidemia   . Hypertension   . Internal hemorrhoids 03-15-1999   Flex Sig   . Irritable bowel    Past Surgical History:  Procedure Laterality Date  . COLONOSCOPY  04-04-11   per Dr. Jarold Motto, clear, no repeats needed  . EYE SURGERY     bilateral cataract extraction per Dr Jettie Pagan  . INGUINAL HERNIA REPAIR    . KNEE ARTHROSCOPY     right knee  . NASAL SINUS SURGERY     Patient Active Problem List   Diagnosis Date Noted  . Ankle edema, bilateral 07/13/2020  . Hallucinations 02/09/2020  . Abdominal pain, acute 08/21/2014  . Enteritis 08/20/2014  . Abdominal pain 08/20/2014  . GI bleeding 05/26/2012  . Renal mass 05/26/2012  . Diverticulosis of colon with hemorrhage 05/26/2012  . Acute posthemorrhagic anemia 05/26/2012  . SHOULDER PAIN, BILATERAL 07/30/2010  . HERPES SIMPLEX INFECTION 10/10/2007  . TACHYCARDIA, PAROXYSMAL NOS 10/10/2007  . EOSINOPHILIA 07/17/2007  . Dyslipidemia 07/11/2007  . Essential hypertension 07/11/2007  . ALLERGIC RHINITIS 07/11/2007    Current Outpatient Medications:  .  AZOPT 1 % ophthalmic suspension, Place 1 drop into both eyes 3 (three) times daily., Disp: , Rfl:  .  bimatoprost (LUMIGAN) 0.01 % SOLN, Place 1 drop into both eyes at bedtime., Disp: , Rfl:  .  cetirizine (ZYRTEC) 10 MG tablet, Take 10 mg by mouth daily as needed for allergies. ,  Disp: , Rfl:  .  Coenzyme Q10 (CO Q 10 PO), Take 1 capsule by mouth 2 (two) times daily. , Disp: , Rfl:  .  diltiazem (CARDIZEM CD) 300 MG 24 hr capsule, TAKE 1 CAPSULE DAILY, Disp: 90 capsule, Rfl: 3 .  donepezil (ARICEPT) 5 MG tablet, Take 1 tablet (5 mg total) by mouth at bedtime., Disp: 1 tablet, Rfl: 0 .  fish oil-omega-3 fatty acids 1000 MG capsule, Take 1 g by mouth daily. , Disp: , Rfl:  .  Glucosamine-Chondroitin (COSAMIN DS) 500-400 MG CAPS, Take 1 tablet by mouth 2 (two) times daily., Disp: , Rfl:  .  Multiple Vitamins-Minerals (MULTIVITAMIN WITH MINERALS) tablet, Take 1 tablet by mouth 2 (two) times daily. , Disp: , Rfl:  .  RHOPRESSA 0.02 % SOLN, Place 1 drop into both eyes at bedtime., Disp: , Rfl:  .  risperiDONE (RISPERDAL) 0.5 MG tablet, Take 1 tablet (0.5 mg total) by mouth at bedtime., Disp: 1 tablet, Rfl: 0 .  terazosin (HYTRIN) 2 MG capsule, Take 2 capsules (4 mg total) by mouth at bedtime., Disp: 180 capsule, Rfl: 3 Allergies  Allergen Reactions  . Chocolate     Itching if he eats too much chocolate   . Ciprofloxacin     Achilles tendon pain   . Lipitor [Atorvastatin]     Muscle aches    Social History   Occupational History  . Occupation: retired  Employer: RETIRED  Tobacco Use  . Smoking status: Former Smoker    Quit date: 03/20/1969    Years since quitting: 51.5  . Smokeless tobacco: Never Used  Vaping Use  . Vaping Use: Never used  Substance and Sexual Activity  . Alcohol use: Yes    Alcohol/week: 0.0 standard drinks    Comment: occ  . Drug use: No  . Sexual activity: Not on file    Objective:   Constitutional Pt is a pleasant 85 y.o. African American male WD, WN in NAD.Marland Kitchen AAO x 3.   Vascular  capillary refill time to digits immediate bilaterally.  Palpable PT pulses bilaterally.  DP pulses are faintly palpable left foot.  DP pulse palpable right foot.  Pedal hair is present bilaterally.  Lower extremity skin temperature gradient within normal limits  bilaterally.  No pain with calf compression bilaterally.  +1 pedal edema noted left lower extremity. No cyanosis or clubbing noted.  Neurologic Normal speech. Oriented to person, place, and time. Protective sensation intact 5/5 intact bilaterally with 10g monofilament b/l. Vibratory sensation intact b/l.  Dermatologic Pedal skin with normal turgor, texture and tone bilaterally. No open wounds bilaterally. No interdigital macerations bilaterally. Toenails 1-5 b/l elongated, discolored, dystrophic, thickened, crumbly with subungual debris and tenderness to dorsal palpation.  Orthopedic: Normal muscle strength 5/5 to all lower extremity muscle groups bilaterally. No pain crepitus or joint limitation noted with ROM b/l. No gross bony deformities bilaterally. Patient ambulates independent of any assistive aids.   Radiographs: None Assessment:   1. Pain due to onychomycosis of toenails of both feet    Plan:  Patient was evaluated and treated and all questions answered.  Onychomycosis with pain -Nails palliatively debridement as below. -Educated on self-care  Procedure: Nail Debridement Rationale: Pain Type of Debridement: manual, sharp debridement. Instrumentation: Nail nipper, rotary burr. Number of Nails: 10  -Examined patient. -No new findings. No new orders. -Toenails 1-5 b/l were debrided in length and girth with sterile nail nippers and dremel without iatrogenic bleeding.  -Patient to report any pedal injuries to medical professional immediately. -Patient to continue soft, supportive shoe gear daily. -Patient/POA to call should there be question/concern in the interim.  Return in about 3 months (around 12/23/2020).  Freddie Breech, DPM

## 2020-10-13 ENCOUNTER — Telehealth: Payer: Self-pay | Admitting: Family Medicine

## 2020-10-13 NOTE — Telephone Encounter (Signed)
Left message for patient to schedule Annual Wellness Visit.  Please schedule with Nurse Health Advisor Shannon Crews, RN at [facility]  

## 2020-12-07 ENCOUNTER — Encounter: Payer: Self-pay | Admitting: Family Medicine

## 2020-12-10 ENCOUNTER — Telehealth: Payer: Self-pay | Admitting: Family Medicine

## 2020-12-10 NOTE — Telephone Encounter (Signed)
Patien'ts wife states she sent a message on December 13th and has still not heard anything back. The message was sent on patient's MyChart.  She is requesting a call back.

## 2020-12-15 NOTE — Telephone Encounter (Signed)
Patient is scheduled for 12/22/2020 at 1:30 PM

## 2020-12-15 NOTE — Telephone Encounter (Signed)
Set up an OV for us to discuss all this  

## 2020-12-22 ENCOUNTER — Encounter: Payer: Self-pay | Admitting: Family Medicine

## 2020-12-22 ENCOUNTER — Other Ambulatory Visit: Payer: Self-pay

## 2020-12-22 ENCOUNTER — Ambulatory Visit (INDEPENDENT_AMBULATORY_CARE_PROVIDER_SITE_OTHER): Payer: Medicare Other | Admitting: Family Medicine

## 2020-12-22 VITALS — BP 98/62 | HR 68 | Ht 72.0 in | Wt 170.0 lb

## 2020-12-22 DIAGNOSIS — N138 Other obstructive and reflux uropathy: Secondary | ICD-10-CM | POA: Insufficient documentation

## 2020-12-22 DIAGNOSIS — Z23 Encounter for immunization: Secondary | ICD-10-CM

## 2020-12-22 DIAGNOSIS — K5900 Constipation, unspecified: Secondary | ICD-10-CM

## 2020-12-22 DIAGNOSIS — I10 Essential (primary) hypertension: Secondary | ICD-10-CM | POA: Diagnosis not present

## 2020-12-22 DIAGNOSIS — N401 Enlarged prostate with lower urinary tract symptoms: Secondary | ICD-10-CM | POA: Diagnosis not present

## 2020-12-22 MED ORDER — DILTIAZEM HCL ER 240 MG PO CP24: 240.0000 mg | ORAL_CAPSULE | Freq: Every day | ORAL | 2 refills | Status: DC

## 2020-12-22 MED ORDER — TAMSULOSIN HCL 0.4 MG PO CAPS: 0.4000 mg | ORAL_CAPSULE | Freq: Every day | ORAL | 3 refills | Status: DC

## 2020-12-22 NOTE — Progress Notes (Signed)
   Subjective:    Patient ID: [name] [name], male    DOB: August 09, 1935, 85 y.o.   MRN: [id number]  HPI Here with his wife for several issues. First he has been very tired lately, and his BP has been low, often around 100-110 systolic. Also he has had some urinary urgency and some nocturia. No burning or discomfort. Also he has been constipated. He uses Miralax but only sporadically.   Review of Systems  Constitutional: Positive for fatigue.  Respiratory: Negative.   Cardiovascular: Negative.   Gastrointestinal: Positive for constipation. Negative for abdominal distention, abdominal pain, nausea and vomiting.  Genitourinary: Positive for frequency. Negative for dysuria.       Objective:   Physical Exam Constitutional:      Appearance: Normal appearance. He is not ill-appearing.  Cardiovascular:     Rate and Rhythm: Normal rate and regular rhythm.     Pulses: Normal pulses.     Heart sounds: Normal heart sounds.  Pulmonary:     Effort: Pulmonary effort is normal.     Breath sounds: Normal breath sounds.  Abdominal:     General: Abdomen is flat. Bowel sounds are normal. There is no distension.     Palpations: Abdomen is soft. There is no mass.     Tenderness: There is no abdominal tenderness. There is no guarding or rebound.     Hernia: No hernia is present.  Neurological:     Mental Status: He is alert.           Assessment & Plan:  His HTN is a bit over treated now that he has lost some weight. We will decrease the Diltiazem XR to 240 mg daily. For the constipation he needs to use Miralax every day. For the urinary issues, he will try Flomax 0.4 mg daily.  Gershon Crane, MD

## 2020-12-22 NOTE — Addendum Note (Signed)
Addended by: Gershon Crane A on: 12/22/2020 02:18 PM   Modules accepted: Orders

## 2020-12-29 ENCOUNTER — Other Ambulatory Visit: Payer: Self-pay

## 2020-12-29 ENCOUNTER — Encounter: Payer: Self-pay | Admitting: Family Medicine

## 2020-12-29 ENCOUNTER — Encounter: Payer: Self-pay | Admitting: Podiatry

## 2020-12-29 ENCOUNTER — Ambulatory Visit (INDEPENDENT_AMBULATORY_CARE_PROVIDER_SITE_OTHER): Payer: Medicare Other | Admitting: Podiatry

## 2020-12-29 DIAGNOSIS — R600 Localized edema: Secondary | ICD-10-CM

## 2020-12-29 DIAGNOSIS — M79675 Pain in left toe(s): Secondary | ICD-10-CM | POA: Diagnosis not present

## 2020-12-29 DIAGNOSIS — M79674 Pain in right toe(s): Secondary | ICD-10-CM | POA: Diagnosis not present

## 2020-12-29 DIAGNOSIS — B351 Tinea unguium: Secondary | ICD-10-CM | POA: Diagnosis not present

## 2020-12-29 DIAGNOSIS — R413 Other amnesia: Secondary | ICD-10-CM

## 2020-12-30 NOTE — Telephone Encounter (Signed)
I placed a referral to Neurology

## 2020-12-31 NOTE — Progress Notes (Signed)
Subjective:  Patient ID: [name] [name], male    DOB: 04-30-1935,  MRN: [id number]  86 y.o. male presents with painful thick toenails that are difficult to trim. Pain interferes with ambulation. Aggravating factors include wearing enclosed shoe gear. Pain is relieved with periodic professional debridement.   He voices no new pedal concerns on today's visit.  Review of Systems: Negative except as noted in the HPI.  Past Medical History:  Diagnosis Date  . Allergy   . Diverticulosis of colon (without mention of hemorrhage) 03-01-2004, 04-04-2011   Colonoscopy  . ED (erectile dysfunction)   . Glaucoma    sees Dr. Arvil Chaco   . Hyperglycemia   . Hyperlipidemia   . Hypertension   . Internal hemorrhoids 03-15-1999   Flex Sig   . Irritable bowel    Past Surgical History:  Procedure Laterality Date  . COLONOSCOPY  04-04-11   per Dr. Jarold Motto, clear, no repeats needed  . EYE SURGERY     bilateral cataract extraction per Dr Jettie Pagan  . INGUINAL HERNIA REPAIR    . KNEE ARTHROSCOPY     right knee  . NASAL SINUS SURGERY     Patient Active Problem List   Diagnosis Date Noted  . BPH with urinary obstruction 12/22/2020  . Ankle edema, bilateral 07/13/2020  . Hallucinations 02/09/2020  . Abdominal pain, acute 08/21/2014  . Enteritis 08/20/2014  . Abdominal pain 08/20/2014  . GI bleeding 05/26/2012  . Renal mass 05/26/2012  . Diverticulosis of colon with hemorrhage 05/26/2012  . Acute posthemorrhagic anemia 05/26/2012  . SHOULDER PAIN, BILATERAL 07/30/2010  . HERPES SIMPLEX INFECTION 10/10/2007  . TACHYCARDIA, PAROXYSMAL NOS 10/10/2007  . EOSINOPHILIA 07/17/2007  . Dyslipidemia 07/11/2007  . Essential hypertension 07/11/2007  . ALLERGIC RHINITIS 07/11/2007    Current Outpatient Medications:  .  AZOPT 1 % ophthalmic suspension, Place 1 drop into both eyes 3 (three) times daily., Disp: , Rfl:  .  bimatoprost (LUMIGAN) 0.01 % SOLN, Place 1 drop into both eyes at bedtime., Disp: ,  Rfl:  .  cetirizine (ZYRTEC) 10 MG tablet, Take 10 mg by mouth daily as needed for allergies., Disp: , Rfl:  .  Coenzyme Q10 (CO Q 10 PO), Take 1 capsule by mouth 2 (two) times daily., Disp: , Rfl:  .  diltiazem (DILACOR XR) 240 MG 24 hr capsule, Take 1 capsule (240 mg total) by mouth daily., Disp: 30 capsule, Rfl: 2 .  donepezil (ARICEPT) 5 MG tablet, Take 1 tablet (5 mg total) by mouth at bedtime. (Patient taking differently: Take 5 mg by mouth at bedtime. Taking 2 tablet daily), Disp: 1 tablet, Rfl: 0 .  fish oil-omega-3 fatty acids 1000 MG capsule, Take 1 g by mouth daily., Disp: , Rfl:  .  Glucosamine-Chondroitin 500-400 MG CAPS, Take 1 tablet by mouth 2 (two) times daily., Disp: , Rfl:  .  Multiple Vitamins-Minerals (MULTIVITAMIN WITH MINERALS) tablet, Take 1 tablet by mouth 2 (two) times daily., Disp: , Rfl:  .  RHOPRESSA 0.02 % SOLN, Place 1 drop into both eyes at bedtime., Disp: , Rfl:  .  risperiDONE (RISPERDAL) 0.5 MG tablet, Take 1 tablet (0.5 mg total) by mouth at bedtime. (Patient taking differently: Take 0.5 mg by mouth at bedtime. Taking 0.5 tab by mouth daily), Disp: 1 tablet, Rfl: 0 .  tamsulosin (FLOMAX) 0.4 MG CAPS capsule, Take 1 capsule (0.4 mg total) by mouth daily., Disp: 30 capsule, Rfl: 3 .  terazosin (HYTRIN) 2 MG capsule, Take 2 capsules (  4 mg total) by mouth at bedtime., Disp: 180 capsule, Rfl: 3 Allergies  Allergen Reactions  . Chocolate     Itching if he eats too much chocolate   . Ciprofloxacin     Achilles tendon pain   . Lipitor [Atorvastatin]     Muscle aches    Social History   Occupational History  . Occupation: retired    Associate Professor: RETIRED  Tobacco Use  . Smoking status: Former Smoker    Quit date: 03/20/1969    Years since quitting: 51.8  . Smokeless tobacco: Never Used  Vaping Use  . Vaping Use: Never used  Substance and Sexual Activity  . Alcohol use: Yes    Alcohol/week: 0.0 standard drinks    Comment: occ  . Drug use: No  . Sexual  activity: Not on file    Objective:   Constitutional Pt is a pleasant 86 y.o. African American male WD, WN in NAD.Marland Kitchen AAO x 3.   Vascular  capillary refill time to digits immediate bilaterally.  Palpable PT pulses bilaterally.  DP pulses are faintly palpable left foot.  DP pulse palpable right foot.  Pedal hair is present bilaterally.  Lower extremity skin temperature gradient within normal limits bilaterally.  No pain with calf compression bilaterally.  +1 pedal edema noted left lower extremity. No cyanosis or clubbing noted.  Neurologic Normal speech. Oriented to person, place, and time. Protective sensation intact 5/5 intact bilaterally with 10g monofilament b/l. Vibratory sensation intact b/l.  Dermatologic Pedal skin with normal turgor, texture and tone bilaterally. No open wounds bilaterally. No interdigital macerations bilaterally. Toenails 1-5 b/l elongated, discolored, dystrophic, thickened, crumbly with subungual debris and tenderness to dorsal palpation.  Orthopedic: Normal muscle strength 5/5 to all lower extremity muscle groups bilaterally. No pain crepitus or joint limitation noted with ROM b/l. No gross bony deformities bilaterally. Patient ambulates independent of any assistive aids.   Radiographs: None Assessment:   1. Pain due to onychomycosis of toenails of both feet   2. Localized edema    Plan:  Patient was evaluated and treated and all questions answered.  Onychomycosis with pain -Nails palliatively debridement as below. -Educated on self-care  Procedure: Nail Debridement Rationale: Pain Type of Debridement: manual, sharp debridement. Instrumentation: Nail nipper, rotary burr. Number of Nails: 10  -Examined patient. -No new findings. No new orders. -Toenails 1-5 b/l were debrided in length and girth with sterile nail nippers and dremel without iatrogenic bleeding.  -Patient to report any pedal injuries to medical professional immediately. -Patient to continue  soft, supportive shoe gear daily. -Patient/POA to call should there be question/concern in the interim.  Return in about 3 months (around 03/29/2021).  Freddie Breech, DPM

## 2021-01-01 ENCOUNTER — Encounter: Payer: Self-pay | Admitting: Neurology

## 2021-02-01 ENCOUNTER — Other Ambulatory Visit: Payer: Self-pay

## 2021-02-01 ENCOUNTER — Encounter (INDEPENDENT_AMBULATORY_CARE_PROVIDER_SITE_OTHER): Payer: Medicare Other | Admitting: Ophthalmology

## 2021-02-01 DIAGNOSIS — H35371 Puckering of macula, right eye: Secondary | ICD-10-CM

## 2021-02-01 DIAGNOSIS — I10 Essential (primary) hypertension: Secondary | ICD-10-CM

## 2021-02-01 DIAGNOSIS — H4423 Degenerative myopia, bilateral: Secondary | ICD-10-CM | POA: Diagnosis not present

## 2021-02-01 DIAGNOSIS — H43813 Vitreous degeneration, bilateral: Secondary | ICD-10-CM

## 2021-02-01 DIAGNOSIS — H35033 Hypertensive retinopathy, bilateral: Secondary | ICD-10-CM | POA: Diagnosis not present

## 2021-02-17 ENCOUNTER — Other Ambulatory Visit: Payer: Self-pay

## 2021-02-17 ENCOUNTER — Encounter: Payer: Self-pay | Admitting: Neurology

## 2021-02-17 ENCOUNTER — Telehealth (INDEPENDENT_AMBULATORY_CARE_PROVIDER_SITE_OTHER): Payer: Medicare Other | Admitting: Neurology

## 2021-02-17 VITALS — BP 116/68 | HR 63 | Resp 18 | Ht 72.0 in | Wt 172.0 lb

## 2021-02-17 DIAGNOSIS — F0391 Unspecified dementia with behavioral disturbance: Secondary | ICD-10-CM | POA: Diagnosis not present

## 2021-02-17 DIAGNOSIS — F03B18 Unspecified dementia, moderate, with other behavioral disturbance: Secondary | ICD-10-CM

## 2021-02-17 MED ORDER — MEMANTINE HCL 10 MG PO TABS: ORAL_TABLET | ORAL | 4 refills | Status: DC

## 2021-02-17 NOTE — Patient Instructions (Signed)
1. Start Memantine (Namenda) 10mg : Take 1 tablet every night for 2 weeks, then increase to 1 tablet twice a day  2. Continue Donepezil 5mg : take 2 tablets daily  3. Try giving the Risperidone (Risperdal) 0.5mg  earlier in the evening (around 8pm) and see if this helps with daytime drowsiness  4. Referral will be sent for home health and home physical therapy  5. Recommend looking into adult day programs, contact [facility] [phone]) to help discuss options  6. Follow-up in 6 months, call for any changes   FALL PRECAUTIONS: Be cautious when walking. Scan the area for obstacles that may increase the risk of trips and falls. When getting up in the mornings, sit up at the edge of the bed for a few minutes before getting out of bed. Consider elevating the bed at the head end to avoid drop of blood pressure when getting up. Walk always in a well-lit room (use night lights in the walls). Avoid area rugs or power cords from appliances in the middle of the walkways. Use a walker or a cane if necessary and consider physical therapy for balance exercise. Get your eyesight checked regularly.  HOME SAFETY: Consider the safety of the kitchen when operating appliances like stoves, microwave oven, and blender. Consider having supervision and share cooking responsibilities until no longer able to participate in those. Accidents with firearms and other hazards in the house should be identified and addressed as well.  ABILITY TO BE LEFT ALONE: If patient is unable to contact 911 operator, consider using [facility], or when the need is there, arrange for someone to stay with patients. Smoking is a fire hazard, consider supervision or cessation. Risk of wandering should be assessed by caregiver and if detected at any point, supervision and safe proof recommendations should be instituted.   RECOMMENDATIONS FOR ALL PATIENTS WITH MEMORY PROBLEMS: 1. Continue to exercise (Recommend 30 minutes of walking  everyday, or 3 hours every week) 2. Increase social interactions - continue going to [place] and enjoy social gatherings with friends and family 3. Eat healthy, avoid fried foods and eat more fruits and vegetables 4. Maintain adequate blood pressure, blood sugar, and blood cholesterol level. Reducing the risk of stroke and cardiovascular disease also helps promoting better memory. 5. Avoid stressful situations. Live a simple life and avoid aggravations. Organize your time and prepare for the next day in anticipation. 6. Sleep well, avoid any interruptions of sleep and avoid any distractions in the bedroom that may interfere with adequate sleep quality 7. Avoid sugar, avoid sweets as there is a strong link between excessive sugar intake, diabetes, and cognitive impairment The Mediterranean diet has been shown to help patients reduce the risk of progressive memory disorders and reduces cardiovascular risk. This includes eating fish, eat fruits and green leafy vegetables, nuts like almonds and hazelnuts, walnuts, and also use olive oil. Avoid fast foods and fried foods as much as possible. Avoid sweets and sugar as sugar use has been linked to worsening of memory function.  There is always a concern of gradual progression of memory problems. If this is the case, then we may need to adjust level of care according to patient needs. Support, both to the patient and caregiver, should then be put into place.
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
## 2021-02-22 ENCOUNTER — Telehealth: Payer: Self-pay

## 2021-02-22 DIAGNOSIS — F0391 Unspecified dementia with behavioral disturbance: Secondary | ICD-10-CM

## 2021-02-22 DIAGNOSIS — F03B18 Unspecified dementia, moderate, with other behavioral disturbance: Secondary | ICD-10-CM

## 2021-02-22 NOTE — Telephone Encounter (Signed)
That is ok, thanks

## 2021-02-22 NOTE — Telephone Encounter (Signed)
Tresa Endo from [facility] called asking to add Home health aid and OT. As well as they would like for him to have a lift chair. Verbal orders for nurse to see pt 2 times a week for 3 weeks and the 1 time a week for 2 weeks. Will call back kelly at [facility] [phone]

## 2021-02-23 NOTE — Addendum Note (Signed)
Addended by: Dimas Chyle on: 02/23/2021 10:00 AM   Modules accepted: Orders

## 2021-02-23 NOTE — Addendum Note (Signed)
Addended by: Dimas Chyle on: 02/23/2021 09:00 AM   Modules accepted: Orders

## 2021-02-23 NOTE — Telephone Encounter (Signed)
Sherice called with verbal PT orders 2 X 2 weeks and 1 X 2 weeks,  Tresa Endo called with order for lift chair and that we will add home health aid and OT for pt to be seen

## 2021-02-24 ENCOUNTER — Other Ambulatory Visit: Payer: Self-pay

## 2021-02-25 ENCOUNTER — Ambulatory Visit (INDEPENDENT_AMBULATORY_CARE_PROVIDER_SITE_OTHER): Payer: Medicare Other | Admitting: Family Medicine

## 2021-02-25 ENCOUNTER — Encounter: Payer: Self-pay | Admitting: Family Medicine

## 2021-02-25 VITALS — BP 124/62 | HR 62 | Temp 97.9°F | Ht 72.0 in | Wt 171.6 lb

## 2021-02-25 DIAGNOSIS — N138 Other obstructive and reflux uropathy: Secondary | ICD-10-CM | POA: Diagnosis not present

## 2021-02-25 DIAGNOSIS — R443 Hallucinations, unspecified: Secondary | ICD-10-CM

## 2021-02-25 DIAGNOSIS — M25472 Effusion, left ankle: Secondary | ICD-10-CM

## 2021-02-25 DIAGNOSIS — F0281 Dementia in other diseases classified elsewhere with behavioral disturbance: Secondary | ICD-10-CM

## 2021-02-25 DIAGNOSIS — N401 Enlarged prostate with lower urinary tract symptoms: Secondary | ICD-10-CM

## 2021-02-25 DIAGNOSIS — E785 Hyperlipidemia, unspecified: Secondary | ICD-10-CM | POA: Diagnosis not present

## 2021-02-25 DIAGNOSIS — F0390 Unspecified dementia without behavioral disturbance: Secondary | ICD-10-CM | POA: Insufficient documentation

## 2021-02-25 DIAGNOSIS — I10 Essential (primary) hypertension: Secondary | ICD-10-CM

## 2021-02-25 DIAGNOSIS — G301 Alzheimer's disease with late onset: Secondary | ICD-10-CM

## 2021-02-25 DIAGNOSIS — M25471 Effusion, right ankle: Secondary | ICD-10-CM | POA: Diagnosis not present

## 2021-02-25 DIAGNOSIS — F02818 Dementia in other diseases classified elsewhere, unspecified severity, with other behavioral disturbance: Secondary | ICD-10-CM

## 2021-02-25 LAB — CBC WITH DIFFERENTIAL/PLATELET
Basophils Absolute: 0 10*3/uL (ref 0.0–0.1)
Basophils Relative: 0.9 % (ref 0.0–3.0)
Eosinophils Absolute: 0.2 10*3/uL (ref 0.0–0.7)
Eosinophils Relative: 4.6 % (ref 0.0–5.0)
HCT: 40.4 % (ref 39.0–52.0)
Hemoglobin: 13.4 g/dL (ref 13.0–17.0)
Lymphocytes Relative: 11.8 % — ABNORMAL LOW (ref 12.0–46.0)
Lymphs Abs: 0.4 10*3/uL — ABNORMAL LOW (ref 0.7–4.0)
MCHC: 33 g/dL (ref 30.0–36.0)
MCV: 81.6 fl (ref 78.0–100.0)
Monocytes Absolute: 0.5 10*3/uL (ref 0.1–1.0)
Monocytes Relative: 14.5 % — ABNORMAL HIGH (ref 3.0–12.0)
Neutro Abs: 2.5 10*3/uL (ref 1.4–7.7)
Neutrophils Relative %: 68.2 % (ref 43.0–77.0)
Platelets: 207 10*3/uL (ref 150.0–400.0)
RBC: 4.96 Mil/uL (ref 4.22–5.81)
RDW: 14.1 % (ref 11.5–15.5)
WBC: 3.6 10*3/uL — ABNORMAL LOW (ref 4.0–10.5)

## 2021-02-25 LAB — LIPID PANEL
Cholesterol: 207 mg/dL — ABNORMAL HIGH (ref 0–200)
HDL: 85.4 mg/dL (ref >39.00)
LDL Cholesterol: 105 mg/dL — ABNORMAL HIGH (ref 0–99)
NonHDL: 121.77
Total CHOL/HDL Ratio: 2
Triglycerides: 84 mg/dL (ref 0.0–149.0)
VLDL: 16.8 mg/dL (ref 0.0–40.0)

## 2021-02-25 LAB — TSH: TSH: 1.79 u[IU]/mL (ref 0.35–4.50)

## 2021-02-25 LAB — BASIC METABOLIC PANEL
BUN: 14 mg/dL (ref 6–23)
CO2: 29 mEq/L (ref 19–32)
Calcium: 9.3 mg/dL (ref 8.4–10.5)
Chloride: 106 mEq/L (ref 96–112)
Creatinine, Ser: 0.97 mg/dL (ref 0.40–1.50)
GFR: 72.22 mL/min (ref >60.00)
Glucose, Bld: 84 mg/dL (ref 70–99)
Potassium: 3.9 mEq/L (ref 3.5–5.1)
Sodium: 140 mEq/L (ref 135–145)

## 2021-02-25 LAB — HEPATIC FUNCTION PANEL
ALT: 31 U/L (ref 0–53)
AST: 24 U/L (ref 0–37)
Albumin: 4.1 g/dL (ref 3.5–5.2)
Alkaline Phosphatase: 50 U/L (ref 39–117)
Bilirubin, Direct: 0.1 mg/dL (ref 0.0–0.3)
Total Bilirubin: 0.7 mg/dL (ref 0.2–1.2)
Total Protein: 6.4 g/dL (ref 6.0–8.3)

## 2021-02-25 LAB — PSA: PSA: 1.13 ng/mL (ref 0.10–4.00)

## 2021-02-25 MED ORDER — TAMSULOSIN HCL 0.4 MG PO CAPS: 0.4000 mg | ORAL_CAPSULE | Freq: Every day | ORAL | 3 refills | Status: DC

## 2021-02-25 MED ORDER — DILTIAZEM HCL ER 240 MG PO CP24: 240.0000 mg | ORAL_CAPSULE | Freq: Every day | ORAL | 3 refills | Status: DC

## 2021-02-25 MED ORDER — TERAZOSIN HCL 2 MG PO CAPS: 4.0000 mg | ORAL_CAPSULE | Freq: Every day | ORAL | 3 refills | Status: DC

## 2021-02-25 NOTE — Progress Notes (Signed)
Subjective:    Patient ID: [name] [name], male    DOB: 1935/03/15, 86 y.o.   MRN: [id number]  HPI Here with his wife to follow up on issues. He recently saw Dr. Karel Jarvis for dementia, and she added Namenda to the Aricept he has been taking. The dementia continues to progress. His BP is stable. Appetite is good.    Review of Systems  Constitutional: Negative.   HENT: Negative.   Eyes: Negative.   Respiratory: Negative.   Cardiovascular: Negative.   Gastrointestinal: Negative.   Genitourinary: Negative.   Musculoskeletal: Negative.   Skin: Negative.   Neurological: Negative.   Psychiatric/Behavioral: Positive for agitation, confusion and hallucinations.       Objective:   Physical Exam Constitutional:      General: He is not in acute distress.    Appearance: He is well-developed and well-nourished. He is not diaphoretic.  HENT:     Head: Normocephalic and atraumatic.     Right Ear: External ear normal.     Left Ear: External ear normal.     Nose: Nose normal.     Mouth/Throat:     Mouth: Oropharynx is clear and moist.     Pharynx: No oropharyngeal exudate.  Eyes:     General: No scleral icterus.       Right eye: No discharge.        Left eye: No discharge.     Extraocular Movements: EOM normal.     Conjunctiva/sclera: Conjunctivae normal.     Pupils: Pupils are equal, round, and reactive to light.  Neck:     Thyroid: No thyromegaly.     Vascular: No JVD.     Trachea: No tracheal deviation.  Cardiovascular:     Rate and Rhythm: Normal rate and regular rhythm.     Pulses: Intact distal pulses.     Heart sounds: Normal heart sounds. No murmur heard. No friction rub. No gallop.   Pulmonary:     Effort: Pulmonary effort is normal. No respiratory distress.     Breath sounds: Normal breath sounds. No wheezing or rales.  Chest:     Chest wall: No tenderness.  Abdominal:     General: Bowel sounds are normal. There is no distension.     Palpations: Abdomen is soft.  There is no mass.     Tenderness: There is no abdominal tenderness. There is no guarding or rebound.  Genitourinary:    Penis: Normal. No tenderness.      Testes: Normal.     Prostate: Normal.     Rectum: Normal. Guaiac result negative.  Musculoskeletal:        General: No tenderness or edema. Normal range of motion.     Cervical back: Neck supple.  Lymphadenopathy:     Cervical: No cervical adenopathy.  Skin:    General: Skin is warm and dry.     Coloration: Skin is not pale.     Findings: No erythema or rash.  Neurological:     Mental Status: He is alert and oriented to person, place, and time.     Cranial Nerves: No cranial nerve deficit.     Motor: No abnormal muscle tone.     Coordination: Coordination normal.     Deep Tendon Reflexes: Reflexes are normal and symmetric. Reflexes normal.  Psychiatric:        Mood and Affect: Mood and affect normal.        Behavior: Behavior normal.  Thought Content: Thought content normal.        Judgment: Judgment normal.           Assessment & Plan:  His dementia steadily progresses, and he will follow up with Dr. Karel Jarvis. Get fasting labs to check lipids, etc. His BPH is stable. HTN isi stable.  Gershon Crane, MD

## 2021-03-09 ENCOUNTER — Emergency Department (HOSPITAL_COMMUNITY): Payer: Medicare Other

## 2021-03-09 ENCOUNTER — Observation Stay (HOSPITAL_COMMUNITY)
Admission: EM | Admit: 2021-03-09 | Discharge: 2021-03-11 | Disposition: A | Discharge status: Home / Self Care | Payer: Medicare Other | Attending: Internal Medicine | Admitting: Internal Medicine

## 2021-03-09 DIAGNOSIS — R569 Unspecified convulsions: Principal | ICD-10-CM

## 2021-03-09 DIAGNOSIS — I10 Essential (primary) hypertension: Secondary | ICD-10-CM | POA: Diagnosis present

## 2021-03-09 DIAGNOSIS — Z79899 Other long term (current) drug therapy: Secondary | ICD-10-CM | POA: Diagnosis not present

## 2021-03-09 DIAGNOSIS — Z87891 Personal history of nicotine dependence: Secondary | ICD-10-CM | POA: Insufficient documentation

## 2021-03-09 DIAGNOSIS — F0390 Unspecified dementia without behavioral disturbance: Secondary | ICD-10-CM | POA: Diagnosis not present

## 2021-03-09 DIAGNOSIS — J189 Pneumonia, unspecified organism: Secondary | ICD-10-CM | POA: Insufficient documentation

## 2021-03-09 DIAGNOSIS — M6281 Muscle weakness (generalized): Secondary | ICD-10-CM | POA: Diagnosis not present

## 2021-03-09 DIAGNOSIS — N401 Enlarged prostate with lower urinary tract symptoms: Secondary | ICD-10-CM | POA: Diagnosis present

## 2021-03-09 DIAGNOSIS — Z20822 Contact with and (suspected) exposure to covid-19: Secondary | ICD-10-CM | POA: Diagnosis not present

## 2021-03-09 DIAGNOSIS — N138 Other obstructive and reflux uropathy: Secondary | ICD-10-CM | POA: Diagnosis present

## 2021-03-09 LAB — CBC WITH DIFFERENTIAL/PLATELET
Abs Immature Granulocytes: 0.01 10*3/uL (ref 0.00–0.07)
Basophils Absolute: 0 10*3/uL (ref 0.0–0.1)
Basophils Relative: 0 %
Eosinophils Absolute: 0.1 10*3/uL (ref 0.0–0.5)
Eosinophils Relative: 1 %
HCT: 41.3 % (ref 39.0–52.0)
Hemoglobin: 12.9 g/dL — ABNORMAL LOW (ref 13.0–17.0)
Immature Granulocytes: 0 %
Lymphocytes Relative: 8 %
Lymphs Abs: 0.4 10*3/uL — ABNORMAL LOW (ref 0.7–4.0)
MCH: 26.7 pg (ref 26.0–34.0)
MCHC: 31.2 g/dL (ref 30.0–36.0)
MCV: 85.5 fL (ref 80.0–100.0)
Monocytes Absolute: 0.5 10*3/uL (ref 0.1–1.0)
Monocytes Relative: 10 %
Neutro Abs: 4.2 10*3/uL (ref 1.7–7.7)
Neutrophils Relative %: 81 %
Platelets: 184 10*3/uL (ref 150–400)
RBC: 4.83 MIL/uL (ref 4.22–5.81)
RDW: 13.6 % (ref 11.5–15.5)
WBC: 5.2 10*3/uL (ref 4.0–10.5)
nRBC: 0 % (ref 0.0–0.2)

## 2021-03-09 LAB — BASIC METABOLIC PANEL
Anion gap: 5 (ref 5–15)
BUN: 14 mg/dL (ref 8–23)
CO2: 26 mmol/L (ref 22–32)
Calcium: 9.8 mg/dL (ref 8.9–10.3)
Chloride: 109 mmol/L (ref 98–111)
Creatinine, Ser: 1.03 mg/dL (ref 0.61–1.24)
GFR, Estimated: >60 mL/min (ref >60)
Glucose, Bld: 178 mg/dL — ABNORMAL HIGH (ref 70–99)
Potassium: 3.9 mmol/L (ref 3.5–5.1)
Sodium: 140 mmol/L (ref 135–145)

## 2021-03-09 LAB — BLOOD GAS, VENOUS
Acid-Base Excess: 0.4 mmol/L (ref 0.0–2.0)
Bicarbonate: 25.8 mmol/L (ref 20.0–28.0)
FIO2: 21
O2 Saturation: 72.8 %
Patient temperature: 37
pCO2, Ven: 52 mmHg (ref 44.0–60.0)
pH, Ven: 7.316 (ref 7.250–7.430)
pO2, Ven: 42 mmHg (ref 32.0–45.0)

## 2021-03-09 LAB — SARS CORONAVIRUS 2 (TAT 6-24 HRS): SARS Coronavirus 2: NEGATIVE

## 2021-03-09 LAB — CBG MONITORING, ED: Glucose-Capillary: 168 mg/dL — ABNORMAL HIGH (ref 70–99)

## 2021-03-09 LAB — HEMOGLOBIN A1C
Hgb A1c MFr Bld: 5.6 % (ref 4.8–5.6)
Mean Plasma Glucose: 114.02 mg/dL

## 2021-03-09 LAB — GLUCOSE, CAPILLARY: Glucose-Capillary: 93 mg/dL (ref 70–99)

## 2021-03-09 LAB — MAGNESIUM: Magnesium: 2.1 mg/dL (ref 1.7–2.4)

## 2021-03-09 MED ORDER — ENOXAPARIN SODIUM 40 MG/0.4ML ~~LOC~~ SOLN
40.0000 mg | SUBCUTANEOUS | Status: DC
  Administered 2021-03-09 – 2021-03-10 (×2): 40 mg via SUBCUTANEOUS
  Filled 2021-03-09 (×2): qty 0.4

## 2021-03-09 MED ORDER — SODIUM CHLORIDE 0.9 % IV SOLN
75.0000 mL/h | INTRAVENOUS | Status: DC
  Administered 2021-03-09 – 2021-03-11 (×2): 75 mL/h via INTRAVENOUS

## 2021-03-09 MED ORDER — TAMSULOSIN HCL 0.4 MG PO CAPS
0.4000 mg | ORAL_CAPSULE | Freq: Every day | ORAL | Status: DC
  Administered 2021-03-10 – 2021-03-11 (×2): 0.4 mg via ORAL
  Filled 2021-03-09 (×2): qty 1

## 2021-03-09 MED ORDER — TERAZOSIN HCL 1 MG PO CAPS
4.0000 mg | ORAL_CAPSULE | Freq: Every day | ORAL | Status: DC
  Administered 2021-03-10: 4 mg via ORAL
  Filled 2021-03-09: qty 4
  Filled 2021-03-09 (×2): qty 2
  Filled 2021-03-09: qty 4

## 2021-03-09 MED ORDER — DILTIAZEM HCL ER COATED BEADS 240 MG PO CP24
240.0000 mg | ORAL_CAPSULE | Freq: Every day | ORAL | Status: DC
  Administered 2021-03-10 – 2021-03-11 (×2): 240 mg via ORAL
  Filled 2021-03-09 (×2): qty 1

## 2021-03-09 MED ORDER — LEVETIRACETAM IN NACL 500 MG/100ML IV SOLN
500.0000 mg | Freq: Two times a day (BID) | INTRAVENOUS | Status: DC
  Administered 2021-03-10 – 2021-03-11 (×3): 500 mg via INTRAVENOUS
  Filled 2021-03-09 (×3): qty 100

## 2021-03-09 MED ORDER — DONEPEZIL HCL 5 MG PO TABS
5.0000 mg | ORAL_TABLET | Freq: Every day | ORAL | Status: DC
  Administered 2021-03-09 – 2021-03-10 (×2): 5 mg via ORAL
  Filled 2021-03-09 (×2): qty 1

## 2021-03-09 MED ORDER — ADULT MULTIVITAMIN W/MINERALS CH
1.0000 | ORAL_TABLET | Freq: Every day | ORAL | Status: DC
  Administered 2021-03-10 – 2021-03-11 (×2): 1 via ORAL
  Filled 2021-03-09 (×2): qty 1

## 2021-03-09 MED ORDER — ICOSAPENT ETHYL 1 G PO CAPS: 1.0000 g | ORAL_CAPSULE | Freq: Every day | ORAL | Status: DC

## 2021-03-09 MED ORDER — RISPERIDONE 0.5 MG PO TABS
0.5000 mg | ORAL_TABLET | Freq: Every day | ORAL | Status: DC
  Administered 2021-03-09 – 2021-03-10 (×2): 0.5 mg via ORAL
  Filled 2021-03-09 (×2): qty 1

## 2021-03-09 MED ORDER — BRINZOLAMIDE 1 % OP SUSP
1.0000 [drp] | Freq: Three times a day (TID) | OPHTHALMIC | Status: DC
  Administered 2021-03-09 – 2021-03-11 (×5): 1 [drp] via OPHTHALMIC
  Filled 2021-03-09: qty 10

## 2021-03-09 MED ORDER — NETARSUDIL DIMESYLATE 0.02 % OP SOLN: 1.0000 [drp] | Freq: Every day | OPHTHALMIC | Status: DC

## 2021-03-09 MED ORDER — LEVETIRACETAM IN NACL 1500 MG/100ML IV SOLN
1500.0000 mg | Freq: Once | INTRAVENOUS | Status: AC
  Administered 2021-03-09: 1500 mg via INTRAVENOUS
  Filled 2021-03-09: qty 100

## 2021-03-09 MED ORDER — MEMANTINE HCL 5 MG PO TABS
5.0000 mg | ORAL_TABLET | Freq: Every day | ORAL | Status: DC
  Administered 2021-03-09 – 2021-03-10 (×2): 5 mg via ORAL
  Filled 2021-03-09 (×3): qty 1

## 2021-03-09 MED ORDER — LORATADINE 10 MG PO TABS
10.0000 mg | ORAL_TABLET | Freq: Every day | ORAL | Status: DC
  Administered 2021-03-10 – 2021-03-11 (×2): 10 mg via ORAL
  Filled 2021-03-09 (×2): qty 1

## 2021-03-09 MED ORDER — LATANOPROST 0.005 % OP SOLN
1.0000 [drp] | Freq: Every day | OPHTHALMIC | Status: DC
  Administered 2021-03-09 – 2021-03-10 (×2): 1 [drp] via OPHTHALMIC
  Filled 2021-03-09: qty 2.5

## 2021-03-09 MED ORDER — GLUCOSAMINE-CHONDROITIN 500-400 MG PO CAPS: 1.0000 | ORAL_CAPSULE | Freq: Two times a day (BID) | ORAL | Status: DC

## 2021-03-09 MED ORDER — OMEGA-3 FATTY ACIDS 1000 MG PO CAPS: 1.0000 g | ORAL_CAPSULE | Freq: Every day | ORAL | Status: DC

## 2021-03-09 MED ORDER — OMEGA-3-ACID ETHYL ESTERS 1 G PO CAPS
1.0000 g | ORAL_CAPSULE | Freq: Every day | ORAL | Status: DC
  Administered 2021-03-10 – 2021-03-11 (×2): 1 g via ORAL
  Filled 2021-03-09 (×2): qty 1

## 2021-03-09 MED ORDER — LORAZEPAM 2 MG/ML IJ SOLN
4.0000 mg | INTRAMUSCULAR | Status: DC | PRN
Start: 2021-03-09 — End: 2021-03-11

## 2021-03-09 NOTE — Consult Note (Signed)
Neurology Consultation  Reason for Consult: seizure-like event Referring Physician: Marianna Fuss, MD  CC: "shaking all over"  History is obtained from: wife and patient  HPI: [name] [name] is a 86 y.o. male  With PMH of dementia w/ hallucinations, HTN, HLD, and chronic nystagmus who presents after "shaking all over his body" and not responding to patient's wife during the event. Per wife patient was unable to give his name after he stopped shaking, but by the time EMS arrived patient was able to speak and could give his name. Wife reports that the she walked into the kitchen and found her husband sitting in his chair where he had been eating breakfast drooling and shaking all over. Wife reports that she is unsure but she was told by EMS "whom she called and stayed on the phone with, that the event likely last 3-4 minutes. Patient and patient wife report that this has never happened to patient before. Patient's wife is unsure if the patient had incontinence and does not remember if the patient's eyes were open or closed.    LKW: before noon tpa given?: no, not indicated  ROS: A 14 point ROS was performed and is negative except as noted in the HPI.   Past Medical History:  Diagnosis Date  . Allergy   . Dementia [facility])    sees Dr. Patrcia Dolly   . Diverticulosis of colon (without mention of hemorrhage) 03-01-2004, 04-04-2011   Colonoscopy  . ED (erectile dysfunction)   . Glaucoma    sees Dr. Arvil Chaco   . Hyperglycemia   . Hyperlipidemia   . Hypertension   . Internal hemorrhoids 03-15-1999   Flex Sig   . Irritable bowel      Family History  Problem Relation Age of Onset  . Hypertension Mother   . Lung cancer Father   . Colitis Daughter      Social History:   reports that he quit smoking about 52 years ago. He has never used smokeless tobacco. He reports current alcohol use. He reports that he does not use drugs.  Medications  Current Facility-Administered  Medications:  .  levETIRAcetam (KEPPRA) IVPB 1500 mg/ 100 mL premix, 1,500 mg, Intravenous, Once, Eliseo Gum B, MD .  Melene Muller ON 03/10/2021] levETIRAcetam (KEPPRA) IVPB 500 mg/100 mL premix, 500 mg, Intravenous, Q12H, Bobbye Morton, MD  Current Outpatient Medications:  .  AZOPT 1 % ophthalmic suspension, Place 1 drop into both eyes 3 (three) times daily., Disp: , Rfl:  .  bimatoprost (LUMIGAN) 0.01 % SOLN, Place 1 drop into both eyes at bedtime., Disp: , Rfl:  .  cetirizine (ZYRTEC) 10 MG tablet, Take 10 mg by mouth daily as needed for allergies., Disp: , Rfl:  .  Coenzyme Q10 (CO Q 10 PO), Take 1 capsule by mouth 2 (two) times daily., Disp: , Rfl:  .  diltiazem (DILACOR XR) 240 MG 24 hr capsule, Take 1 capsule (240 mg total) by mouth daily., Disp: 90 capsule, Rfl: 3 .  donepezil (ARICEPT) 5 MG tablet, Take 1 tablet (5 mg total) by mouth at bedtime. (Patient taking differently: Take 5 mg by mouth at bedtime. Taking 2 tablet daily), Disp: 1 tablet, Rfl: 0 .  fish oil-omega-3 fatty acids 1000 MG capsule, Take 1 g by mouth daily., Disp: , Rfl:  .  Glucosamine-Chondroitin 500-400 MG CAPS, Take 1 tablet by mouth 2 (two) times daily., Disp: , Rfl:  .  memantine (NAMENDA) 10 MG tablet, Take 1 tablet every  night for 2 weeks, then increase to 1 tablet twice a day, Disp: 60 tablet, Rfl: 4 .  Multiple Vitamins-Minerals (MULTIVITAMIN WITH MINERALS) tablet, Take 1 tablet by mouth 2 (two) times daily., Disp: , Rfl:  .  RHOPRESSA 0.02 % SOLN, Place 1 drop into both eyes at bedtime., Disp: , Rfl:  .  risperiDONE (RISPERDAL) 0.5 MG tablet, Take 1 tablet (0.5 mg total) by mouth at bedtime. (Patient taking differently: Take 0.5 mg by mouth at bedtime. Taking 0.5 tab by mouth daily), Disp: 1 tablet, Rfl: 0 .  tamsulosin (FLOMAX) 0.4 MG CAPS capsule, Take 1 capsule (0.4 mg total) by mouth daily., Disp: 90 capsule, Rfl: 3 .  terazosin (HYTRIN) 2 MG capsule, Take 2 capsules (4 mg total) by mouth at bedtime., Disp: 180  capsule, Rfl: 3   Exam: Current vital signs: BP (!) 162/90   Pulse 72   Resp 14   SpO2 100%  Vital signs in last 24 hours: Pulse Rate:  [52-79] 72 (03/15 1630) Resp:  [10-23] 14 (03/15 1630) BP: (118-172)/(68-90) 162/90 (03/15 1630) SpO2:  [98 %-100 %] 100 % (03/15 1630)  GENERAL: Awake, alert in NAD HEENT: - Normocephalic and atraumatic, dry mm, LUNGS - Normal respiratory effort. SaO2 CV - bradycardiac sinus rhythm  on tele ABDOMEN - Soft, nontender Ext: warm, well perfused some atrophy to the muscles of the hands  NEURO:  Mental Status: Per wife patient is now at baseline. Patient is oriented to person and can recongize his wife. Patient was not oriented to place but after looking around realized he was not at home, and believed it was [place] 2021. Speech/Language: speech is __clear and coherent___.  Naming, repetition, fluency, and comprehension intact. Cranial Nerves:  II: PERRL_2___mm/brisk. decreased bilateral peripheral fields. III, IV, VI: EOMI w/ chronic horizontal nystagmus. Lid elevation symmetric and full.  V: Sensation is intact to light touch and symmetrical to face. Blinks to threat. VII: Face is symmetric resting and smiling. Able to puff cheeks and raise eyebrows.  VIII: Hearing intact to voice IX, X: Palate elevation is symmetric. Phonation normal.  XI: Normal sternocleidomastoid and trapezius muscle strength XII: Tongue protrudes midline without fasciculations.   Motor: 5/5 strength is all muscle groups.  Tone is normal. Bulk is normal. In extremities with the exception of the hands.  Sensation- Intact to light touch bilaterally in all four extremities. Extinction intact.  Coordination: FTN intact bilaterally. No pronator drift.  DTRs: 2+ throughout.  Gait- deferred  1a Level of Conscious.: 0 1b LOC Questions: 0 1c LOC Commands: 0 2 Best Gaze: 0 3 Visual: 0 4 Facial Palsy: 0 5a Motor Arm - left: 0 5b Motor Arm - Right: 0 6a Motor Leg - Left: 0 6b  Motor Leg - Right: 0 7 Limb Ataxia: 0 8 Sensory: 0 9 Best Language: 0 10 Dysarthria: 0 11 Extinct. and Inatten.: 0 TOTAL: 0   Labs I have reviewed labs in epic and the results pertinent to this consultation are:   CBC    Component Value Date/Time   WBC 5.2 03/09/2021 1413   RBC 4.83 03/09/2021 1413   HGB 12.9 (L) 03/09/2021 1413   HCT 41.3 03/09/2021 1413   PLT 184 03/09/2021 1413   MCV 85.5 03/09/2021 1413   MCH 26.7 03/09/2021 1413   MCHC 31.2 03/09/2021 1413   RDW 13.6 03/09/2021 1413   LYMPHSABS 0.4 (L) 03/09/2021 1413   MONOABS 0.5 03/09/2021 1413   EOSABS 0.1 03/09/2021 1413   BASOSABS 0.0  03/09/2021 1413    CMP     Component Value Date/Time   NA 140 03/09/2021 1413   K 3.9 03/09/2021 1413   CL 109 03/09/2021 1413   CO2 26 03/09/2021 1413   GLUCOSE 178 (H) 03/09/2021 1413   BUN 14 03/09/2021 1413   CREATININE 1.03 03/09/2021 1413   CALCIUM 9.8 03/09/2021 1413   PROT 6.4 02/25/2021 1039   ALBUMIN 4.1 02/25/2021 1039   AST 24 02/25/2021 1039   ALT 31 02/25/2021 1039   ALKPHOS 50 02/25/2021 1039   BILITOT 0.7 02/25/2021 1039   GFRNONAA >60 03/09/2021 1413   GFRAA 48 (L) 11/06/2019 1803    Lipid Panel     Component Value Date/Time   CHOL 207 (H) 02/25/2021 1039   TRIG 84.0 02/25/2021 1039   HDL 85.40 02/25/2021 1039   CHOLHDL 2 02/25/2021 1039   VLDL 16.8 02/25/2021 1039   LDLCALC 105 (H) 02/25/2021 1039   LDLDIRECT 154.3 10/23/2013 0916     Imaging I have reviewed the images obtained:  CT-scan of the brain IMPRESSION: No acute abnormality  Moderate atrophy. Chronic white matter changes consistent with chronic microvascular ischemia  Extra-axial dural base calcification in the right frontotemporal lobe is chronic and unchanged. Possible meningioma. No brain edema or mass-effect.    Impression: Mr. Hermelinda Medicus is a 86 yo patient who presents after an event concerning for seizure-like behavior. Patient's wife describes full body  tonic-clonic movement with inattention with what appears to have been a post-ictal state before return to his baseline. Patient's known hx of dementia and CT confirming significant atrophy places him at increased risk for seizures. Patient also noted that he continues to see hallucinations and these were associated with his worsening dementia; indicating that patient's medications are no longer enough therapy for his current dementia state. CT noted that patient has a chronic meningioma which also places patient at increased risk for seizures; however, this growth appears stable. Will further investigate meningioma with MRI.  Recommendations: - EEG - F/u CXR, concern for aspiration PNA as event occurred while patient was eating - F/u UA - F/u MRI Brain w wo contrast - continuous tele - Neurology will continue to follow  Eliseo Gum, MD PGY-1

## 2021-03-09 NOTE — ED Notes (Signed)
RN attempted report x2 

## 2021-03-09 NOTE — Progress Notes (Signed)
EEG completed, results pending. 

## 2021-03-09 NOTE — H&P (Signed)
History and Physical    [name] [name] CBJ:[id number] DOB: [birth year] DOA: 03/09/2021  PCP: [name], MD (Confirm with patient/family/NH records and if not entered, this has to be entered at [facility] point of entry) Patient coming from: Home  I have personally briefly reviewed patient's old medical records in [facility]  Chief Complaint: Seizure  HPI: [name] [name] is a 86 y.o. male with medical history significant of advanced dementia, BPH, HTN, glaucoma, presented with question of new onset seizure.  Wife reported that this morning, she walked into the kitchen found patient sitting on a kitchen chair whole body shaking, and unresponsive.  Episode lasted about 3 to 4 minutes, the patient recovered consciousness but confused.  No loss control of urine or bowel movement.  Unsure about tongue bite.  Unsure about eyeball rolling.  2 weeks ago, neurology start patient on Namenda, patient still been taking 1 pill at bedtime bridging for 1 p.o. twice daily.  Wife reported patient has been tolerating Namenda well.  Currently, patient awake alert oriented to name and place confused about time.  Reported no pain/soreness, no vision changes or headache.  No numbness or weakness of the limbs.  ED Course: CT head showed chronic dural calcifications suspicious for hemangioma right frontotemporal lobe.  Review of Systems: As per HPI otherwise 14 point review of systems negative.    Past Medical History:  Diagnosis Date  . Allergy   . Dementia [facility])    sees Dr. Patrcia Dolly   . Diverticulosis of colon (without mention of hemorrhage) 03-01-2004, 04-04-2011   Colonoscopy  . ED (erectile dysfunction)   . Glaucoma    sees Dr. Arvil Chaco   . Hyperglycemia   . Hyperlipidemia   . Hypertension   . Internal hemorrhoids 03-15-1999   Flex Sig   . Irritable bowel     Past Surgical History:  Procedure Laterality Date  . COLONOSCOPY  04-04-11   per Dr. Jarold Motto, clear, no repeats needed  . EYE  SURGERY     bilateral cataract extraction per Dr Jettie Pagan  . INGUINAL HERNIA REPAIR    . KNEE ARTHROSCOPY     right knee  . NASAL SINUS SURGERY       reports that he quit smoking about 52 years ago. He has never used smokeless tobacco. He reports current alcohol use. He reports that he does not use drugs.  Allergies  Allergen Reactions  . Chocolate     Itching if he eats too much chocolate   . Ciprofloxacin     Achilles tendon pain   . Lipitor [Atorvastatin]     Muscle aches     Family History  Problem Relation Age of Onset  . Hypertension Mother   . Lung cancer Father   . Colitis Daughter      Prior to Admission medications   Medication Sig Start Date End Date Taking? Authorizing Provider  AZOPT 1 % ophthalmic suspension Place 1 drop into both eyes 3 (three) times daily. 12/18/19   [provider]  bimatoprost (LUMIGAN) 0.01 % SOLN Place 1 drop into both eyes at bedtime.    [provider]  cetirizine (ZYRTEC) 10 MG tablet Take 10 mg by mouth daily as needed for allergies.    [provider]  Coenzyme Q10 (CO Q 10 PO) Take 1 capsule by mouth 2 (two) times daily.    [provider]  diltiazem (DILACOR XR) 240 MG 24 hr capsule Take 1 capsule (240 mg  total) by mouth daily. 02/25/21   [name], MD  donepezil (ARICEPT) 5 MG tablet Take 1 tablet (5 mg total) by mouth at bedtime. Patient taking differently: Take 5 mg by mouth at bedtime. Taking 2 tablet daily 02/25/20   [name], MD  fish oil-omega-3 fatty acids 1000 MG capsule Take 1 g by mouth daily.    [provider]  Glucosamine-Chondroitin 500-400 MG CAPS Take 1 tablet by mouth 2 (two) times daily.    [provider]  memantine (NAMENDA) 10 MG tablet Take 1 tablet every night for 2 weeks, then increase to 1 tablet twice a day 02/17/21   Van Clines, MD  Multiple Vitamins-Minerals (MULTIVITAMIN WITH MINERALS) tablet Take 1 tablet by mouth 2 (two) times daily.     [provider]  RHOPRESSA 0.02 % SOLN Place 1 drop into both eyes at bedtime. 08/17/19   [provider]  risperiDONE (RISPERDAL) 0.5 MG tablet Take 1 tablet (0.5 mg total) by mouth at bedtime. Patient taking differently: Take 0.5 mg by mouth at bedtime. Taking 0.5 tab by mouth daily 02/25/20   [name], MD  tamsulosin (FLOMAX) 0.4 MG CAPS capsule Take 1 capsule (0.4 mg total) by mouth daily. 02/25/21   [name], MD  terazosin (HYTRIN) 2 MG capsule Take 2 capsules (4 mg total) by mouth at bedtime. 02/25/21   [name], MD    Physical Exam: Vitals:   03/09/21 1730 03/09/21 1745 03/09/21 1800 03/09/21 1815  BP: 132/72 130/80 128/83   Pulse: (!) 49 (!) 48 (!) 50 92  Resp: 14 14 13  (!) 22  SpO2: 100% 98% 100% 99%    Constitutional: NAD, calm, comfortable Vitals:   03/09/21 1730 03/09/21 1745 03/09/21 1800 03/09/21 1815  BP: 132/72 130/80 128/83   Pulse: (!) 49 (!) 48 (!) 50 92  Resp: 14 14 13  (!) 22  SpO2: 100% 98% 100% 99%   Eyes: PERRL, lids and conjunctivae normal ENMT: Mucous membranes are moist. Posterior pharynx clear of any exudate or lesions.Normal dentition.  Neck: normal, supple, no masses, no thyromegaly Respiratory: clear to auscultation bilaterally, no wheezing, no crackles. Normal respiratory effort. No accessory muscle use.  Cardiovascular: Regular rate and rhythm, no murmurs / rubs / gallops. No extremity edema. 2+ pedal pulses. No carotid bruits.  Abdomen: no tenderness, no masses palpated. No hepatosplenomegaly. Bowel sounds positive.  Musculoskeletal: no clubbing / cyanosis. No joint deformity upper and lower extremities. Good ROM, no contractures. Normal muscle tone.  Skin: no rashes, lesions, ulcers. No induration Neurologic: CN 2-12 grossly intact. Sensation intact, DTR normal. Strength 5/5 in all 4.  Psychiatric: Normal judgment and insight. Alert and oriented x 2. Normal mood.     Labs on Admission: I have personally reviewed  following labs and imaging studies  CBC: Recent Labs  Lab 03/09/21 1413  WBC 5.2  NEUTROABS 4.2  HGB 12.9*  HCT 41.3  MCV 85.5  PLT 184   Basic Metabolic Panel: Recent Labs  Lab 03/09/21 1413  NA 140  K 3.9  CL 109  CO2 26  GLUCOSE 178*  BUN 14  CREATININE 1.03  CALCIUM 9.8  MG 2.1   GFR: Estimated Creatinine Clearance: 59.6 mL/min (by C-G formula based on SCr of 1.03 mg/dL). Liver Function Tests: No results for input(s): AST, ALT, ALKPHOS, BILITOT, PROT, ALBUMIN in the last 168 hours. No results for input(s): LIPASE, AMYLASE in the last 168 hours. No results for input(s): AMMONIA  in the last 168 hours. Coagulation Profile: No results for input(s): INR, PROTIME in the last 168 hours. Cardiac Enzymes: No results for input(s): CKTOTAL, CKMB, CKMBINDEX, TROPONINI in the last 168 hours. BNP (last 3 results) No results for input(s): PROBNP in the last 8760 hours. HbA1C: No results for input(s): HGBA1C in the last 72 hours. CBG: Recent Labs  Lab 03/09/21 1413  GLUCAP 168*   Lipid Profile: No results for input(s): CHOL, HDL, LDLCALC, TRIG, CHOLHDL, LDLDIRECT in the last 72 hours. Thyroid Function Tests: No results for input(s): TSH, T4TOTAL, FREET4, T3FREE, THYROIDAB in the last 72 hours. Anemia Panel: No results for input(s): VITAMINB12, FOLATE, FERRITIN, TIBC, IRON, RETICCTPCT in the last 72 hours. Urine analysis:    Component Value Date/Time   COLORURINE YELLOW 02/06/2020 1032   APPEARANCEUR CLEAR 02/06/2020 1032   LABSPEC 1.025 02/06/2020 1032   PHURINE 5.5 02/06/2020 1032   GLUCOSEU NEGATIVE 02/06/2020 1032   HGBUR NEGATIVE 02/06/2020 1032   HGBUR negative 09/16/2010 0839   BILIRUBINUR NEGATIVE 02/06/2020 1032   BILIRUBINUR neg 02/22/2019 0958   KETONESUR 40 (A) 02/06/2020 1032   PROTEINUR Negative 02/22/2019 0958   PROTEINUR 30 (A) 01/06/2016 1009   UROBILINOGEN 0.2 02/06/2020 1032   NITRITE NEGATIVE 02/06/2020 1032   LEUKOCYTESUR NEGATIVE  02/06/2020 1032    Radiological Exams on Admission: CT HEAD WO CONTRAST  Result Date: 03/09/2021 CLINICAL DATA:  Nontraumatic seizure EXAM: CT HEAD WITHOUT CONTRAST TECHNIQUE: Contiguous axial images were obtained from the base of the skull through the vertex without intravenous contrast. COMPARISON:  CT head 02/08/2020.  MRI head 02/08/2020 FINDINGS: Brain: Moderate atrophy and ventricular enlargement, stable. Mild white matter hypodensity bilaterally, stable. No acute infarct or hemorrhage Dural calcification in the right frontotemporal lobe is stable from prior studies. No mass-effect on the brain. No underlying bony change in the calvarium. Vascular: Negative for hyperdense vessel Skull: No focal skull lesion. Sinuses/Orbits: Moderate mucosal edema paranasal sinuses. Deformity and collapse of the right maxillary sinus with extensive bony thickening. Question prior trauma or surgery. Bilateral cataract extraction. Other: None IMPRESSION: No acute abnormality Moderate atrophy. Chronic white matter changes consistent with chronic microvascular ischemia Extra-axial dural base calcification in the right frontotemporal lobe is chronic and unchanged. Possible meningioma. No brain edema or mass-effect. Electronically Signed   By: Marlan Palauharles  Clark M.D.   On: 03/09/2021 14:57   DG CHEST PORT 1 VIEW  Result Date: 03/09/2021 CLINICAL DATA:  Pneumonia. EXAM: PORTABLE CHEST 1 VIEW COMPARISON:  09/08/2020 FINDINGS: 1710 hours. The lungs are clear without focal pneumonia, edema, pneumothorax or pleural effusion. Interstitial markings are diffusely coarsened with chronic features. Cardiopericardial silhouette is at upper limits of normal for size. The visualized bony structures of the thorax show no acute abnormality. Telemetry leads overlie the chest. IMPRESSION: Chronic interstitial coarsening, most prominent at the bases. No acute cardiopulmonary findings. Electronically Signed   By: Kennith CenterEric  Mansell M.D.   On:  03/09/2021 17:19    EKG: Independently reviewed.  Chronic RBBB and nonspecific ST changes.  Assessment/Plan Active Problems:   Seizure (HCC)  (please populate well all problems here in Problem List. (For example, if patient is on BP meds at home and you resume or decide to hold them, it is a problem that needs to be her. Same for CAD, COPD, HLD and so on)  New onset of seizure -EEG and MRI ordered by neuro -Keppra load and BID -PRN Ativan for breakthrough seizure -Tele obs, and expect pt can go home within 24  hours after seizure workup done. -Check calcium ionized level and VBG to rule out acid base abnormalities.  Advance dementia with visual hallucination -Baseline can not bathe himself and sometime need help with dressing up. -Continue Aricept and Namenda (does not appear seizure is one the side effect) -Continue risperidone  HTN -Controlled  Glaucoma -Stable, home eye drops orderd  DVT prophylaxis: Lovenox Code Status: FullCode Family Communication: Wife on phone Disposition Plan: Expect less than 2 midnight hospital stay Consults called: Neuro Admission status: Tele obs   Emeline General MD [facility] Pager [phone]  03/09/2021, 6:22 PM

## 2021-03-09 NOTE — ED Triage Notes (Addendum)
BIB [facility] after pt wife called to report having witnessed pt drooling and unable to follow commands while sitting in the kitchen. PT does does now have any prior hx of seizures. Per EMS, wife reports episode lasted 3-4 mins in total. PT alert at beside but does have hx of dementia and nystagmus.

## 2021-03-09 NOTE — Progress Notes (Signed)
PHARMACIST - PHYSICIAN ORDER COMMUNICATION  CONCERNING: P&T Medication Policy on Herbal Medications  DESCRIPTION:  This patient's order for: glucosamine-chondroitin 500-400 mg has been noted.  This product(s) is classified as an "herbal" or natural product. Due to a lack of definitive safety studies or FDA approval, nonstandard manufacturing practices, plus the potential risk of unknown drug-drug interactions while on inpatient medications, the Pharmacy and Therapeutics Committee does not permit the use of "herbal" or natural products of this type within [facility].   ACTION TAKEN: The pharmacy department is unable to verify this order at this time and your patient has been informed of this safety policy. Please reevaluate patient's clinical condition at discharge and address if the herbal or natural product(s) should be resumed at that time.

## 2021-03-09 NOTE — ED Notes (Signed)
RN attempted report x3 

## 2021-03-09 NOTE — ED Provider Notes (Signed)
[facility] EMERGENCY DEPARTMENT Provider Note   CSN: [id number] Arrival date & time: 03/09/21  1348     History Chief Complaint  Patient presents with  . Seizures    [name] [name] is a 86 y.o. male.  Level 5 caveat secondary to dementia.  Per EMS patient had an unresponsive episode at home which she was drooling and unable to follow commands that lasted minutes.  Patient does not recall and has no complaints.  The history is provided by the EMS personnel, the patient and the spouse.  Seizures Seizure activity on arrival: no   Seizure type:  Unable to specify Initial focality:  Unable to specify Episode characteristics: unresponsiveness   Return to baseline: yes   Severity:  Unable to specify Timing:  Once Number of seizures this episode:  1 Progression:  Resolved Recent head injury:  No recent head injuries PTA treatment:  None History of seizures: no        Past Medical History:  Diagnosis Date  . Allergy   . Dementia [facility])    sees Dr. Patrcia Dolly   . Diverticulosis of colon (without mention of hemorrhage) 03-01-2004, 04-04-2011   Colonoscopy  . ED (erectile dysfunction)   . Glaucoma    sees Dr. Arvil Chaco   . Hyperglycemia   . Hyperlipidemia   . Hypertension   . Internal hemorrhoids 03-15-1999   Flex Sig   . Irritable bowel     Patient Active Problem List   Diagnosis Date Noted  . Dementia (HCC) 02/25/2021  . BPH with urinary obstruction 12/22/2020  . Ankle edema, bilateral 07/13/2020  . Hallucinations 02/09/2020  . Abdominal pain, acute 08/21/2014  . GI bleeding 05/26/2012  . Renal mass 05/26/2012  . Diverticulosis of colon with hemorrhage 05/26/2012  . SHOULDER PAIN, BILATERAL 07/30/2010  . HERPES SIMPLEX INFECTION 10/10/2007  . TACHYCARDIA, PAROXYSMAL NOS 10/10/2007  . Dyslipidemia 07/11/2007  . Essential hypertension 07/11/2007  . ALLERGIC RHINITIS 07/11/2007    Past Surgical History:  Procedure Laterality Date  .  COLONOSCOPY  04-04-11   per Dr. Jarold Motto, clear, no repeats needed  . EYE SURGERY     bilateral cataract extraction per Dr Jettie Pagan  . INGUINAL HERNIA REPAIR    . KNEE ARTHROSCOPY     right knee  . NASAL SINUS SURGERY         Family History  Problem Relation Age of Onset  . Hypertension Mother   . Lung cancer Father   . Colitis Daughter     Social History   Tobacco Use  . Smoking status: Former Smoker    Quit date: 03/20/1969    Years since quitting: 52.0  . Smokeless tobacco: Never Used  Vaping Use  . Vaping Use: Never used  Substance Use Topics  . Alcohol use: Yes    Alcohol/week: 0.0 standard drinks    Comment: occ  . Drug use: No    Home Medications Prior to Admission medications   Medication Sig Start Date End Date Taking? Authorizing Provider  AZOPT 1 % ophthalmic suspension Place 1 drop into both eyes 3 (three) times daily. 12/18/19   [provider]  bimatoprost (LUMIGAN) 0.01 % SOLN Place 1 drop into both eyes at bedtime.    [provider]  cetirizine (ZYRTEC) 10 MG tablet Take 10 mg by mouth daily as needed for allergies.    [provider]  Coenzyme Q10 (CO Q 10 PO) Take 1 capsule by mouth 2 (two) times  daily.    [provider]  diltiazem (DILACOR XR) 240 MG 24 hr capsule Take 1 capsule (240 mg total) by mouth daily. 02/25/21   [name], MD  donepezil (ARICEPT) 5 MG tablet Take 1 tablet (5 mg total) by mouth at bedtime. Patient taking differently: Take 5 mg by mouth at bedtime. Taking 2 tablet daily 02/25/20   [name], MD  fish oil-omega-3 fatty acids 1000 MG capsule Take 1 g by mouth daily.    [provider]  Glucosamine-Chondroitin 500-400 MG CAPS Take 1 tablet by mouth 2 (two) times daily.    [provider]  memantine (NAMENDA) 10 MG tablet Take 1 tablet every night for 2 weeks, then increase to 1 tablet twice a day 02/17/21   Van Clines, MD  Multiple Vitamins-Minerals (MULTIVITAMIN WITH  MINERALS) tablet Take 1 tablet by mouth 2 (two) times daily.    [provider]  RHOPRESSA 0.02 % SOLN Place 1 drop into both eyes at bedtime. 08/17/19   [provider]  risperiDONE (RISPERDAL) 0.5 MG tablet Take 1 tablet (0.5 mg total) by mouth at bedtime. Patient taking differently: Take 0.5 mg by mouth at bedtime. Taking 0.5 tab by mouth daily 02/25/20   [name], MD  tamsulosin (FLOMAX) 0.4 MG CAPS capsule Take 1 capsule (0.4 mg total) by mouth daily. 02/25/21   [name], MD  terazosin (HYTRIN) 2 MG capsule Take 2 capsules (4 mg total) by mouth at bedtime. 02/25/21   [name], MD    Allergies    Chocolate, Ciprofloxacin, and Lipitor [atorvastatin]  Review of Systems   Review of Systems  Unable to perform ROS: Dementia  Neurological: Positive for seizures.    Physical Exam Updated Vital Signs BP (!) 151/75   Pulse (!) 51   Resp 15   SpO2 100%   Physical Exam Vitals and nursing note reviewed.  Constitutional:      Appearance: Normal appearance. He is well-developed.  HENT:     Head: Normocephalic and atraumatic.  Eyes:     Conjunctiva/sclera: Conjunctivae normal.  Cardiovascular:     Rate and Rhythm: Normal rate and regular rhythm.     Heart sounds: No murmur heard.   Pulmonary:     Effort: Pulmonary effort is normal. No respiratory distress.     Breath sounds: Normal breath sounds.  Abdominal:     Palpations: Abdomen is soft.     Tenderness: There is no abdominal tenderness.  Musculoskeletal:        General: No deformity or signs of injury. Normal range of motion.     Cervical back: Neck supple.  Skin:    General: Skin is warm and dry.  Neurological:     General: No focal deficit present.     Mental Status: He is alert. Mental status is at baseline. He is disoriented.     ED Results / Procedures / Treatments   Labs (all labs ordered are listed, but only abnormal results are displayed) Labs Reviewed  BASIC METABOLIC PANEL -  Abnormal; Notable for the following components:      Result Value   Glucose, Bld 178 (*)    All other components within normal limits  CBC WITH DIFFERENTIAL/PLATELET - Abnormal; Notable for the following components:   Hemoglobin 12.9 (*)    Lymphs Abs 0.4 (*)    All other components within normal limits  CBG MONITORING, ED - Abnormal; Notable for the following components:  Glucose-Capillary 168 (*)    All other components within normal limits  SARS CORONAVIRUS 2 (TAT 6-24 HRS)  MAGNESIUM  URINALYSIS, ROUTINE W REFLEX MICROSCOPIC    EKG EKG Interpretation  Date/Time:  Tuesday March 09 2021 13:58:43 EDT Ventricular Rate:  60 PR Interval:    QRS Duration: 165 QT Interval:  454 QTC Calculation: 454 R Axis:   -58 Text Interpretation: Sinus rhythm RBBB and LAFB No significant change since prior 2/21 Confirmed by Meridee ScoreButler, Weronika Birch [phone]([id number]) on 03/09/2021 2:03:06 PM   Radiology CT HEAD WO CONTRAST  Result Date: 03/09/2021 CLINICAL DATA:  Nontraumatic seizure EXAM: CT HEAD WITHOUT CONTRAST TECHNIQUE: Contiguous axial images were obtained from the base of the skull through the vertex without intravenous contrast. COMPARISON:  CT head 02/08/2020.  MRI head 02/08/2020 FINDINGS: Brain: Moderate atrophy and ventricular enlargement, stable. Mild white matter hypodensity bilaterally, stable. No acute infarct or hemorrhage Dural calcification in the right frontotemporal lobe is stable from prior studies. No mass-effect on the brain. No underlying bony change in the calvarium. Vascular: Negative for hyperdense vessel Skull: No focal skull lesion. Sinuses/Orbits: Moderate mucosal edema paranasal sinuses. Deformity and collapse of the right maxillary sinus with extensive bony thickening. Question prior trauma or surgery. Bilateral cataract extraction. Other: None IMPRESSION: No acute abnormality Moderate atrophy. Chronic white matter changes consistent with chronic microvascular ischemia Extra-axial dural  base calcification in the right frontotemporal lobe is chronic and unchanged. Possible meningioma. No brain edema or mass-effect. Electronically Signed   By: Marlan Palauharles  Clark M.D.   On: 03/09/2021 14:57   DG CHEST PORT 1 VIEW  Result Date: 03/09/2021 CLINICAL DATA:  Pneumonia. EXAM: PORTABLE CHEST 1 VIEW COMPARISON:  09/08/2020 FINDINGS: 1710 hours. The lungs are clear without focal pneumonia, edema, pneumothorax or pleural effusion. Interstitial markings are diffusely coarsened with chronic features. Cardiopericardial silhouette is at upper limits of normal for size. The visualized bony structures of the thorax show no acute abnormality. Telemetry leads overlie the chest. IMPRESSION: Chronic interstitial coarsening, most prominent at the bases. No acute cardiopulmonary findings. Electronically Signed   By: Kennith CenterEric  Mansell M.D.   On: 03/09/2021 17:19    Procedures Procedures   Medications Ordered in ED Medications  levETIRAcetam (KEPPRA) IVPB 500 mg/100 mL premix (has no administration in time range)  levETIRAcetam (KEPPRA) IVPB 1500 mg/ 100 mL premix (0 mg Intravenous Stopped 03/09/21 1742)    ED Course  I have reviewed the triage vital signs and the nursing notes.  Pertinent labs & imaging results that were available during my care of the patient were reviewed by me and considered in my medical decision making (see chart for details).  Clinical Course as of 03/09/21 1742  Tue Mar 09, 2021  1513 Wife is here now who is able to provide a little more history.  She said she was checking on him after lunch and found him shaking both sides of his body.  Probably lasted a few minutes because she was calling 911.  Since then he seemed very tired.  Follows with neurology Dr. Karel JarvisAquino for his memory loss.  No prior history of seizures. [MB]  1522 Discussed with Dr. Amada JupiterKirkpatrick from neurology who will evaluate the patient as a consultant. [MB]    Clinical Course User Index [MB] Terrilee FilesButler, Olisa Quesnel C, MD    MDM Rules/Calculators/A&P                         This patient complains of unresponsive episode with  shaking; this involves an extensive number of treatment Options and is a complaint that carries with it a high risk of complications and Morbidity. The differential includes seizure, syncope, arrhythmia, metabolic derangement, myoclonus  I ordered, reviewed and interpreted labs, which included CBC with normal white count, stable hemoglobin, chemistries normal other than elevated glucose, normal magnesium I ordered imaging studies which included head CT and I independently    visualized and interpreted imaging which showed chronic findings no acute Additional history obtained from EMS and patient's wife Previous records obtained and reviewed in epic including prior neurology visit I consulted Dr. Amada Jupiter neuro hospitalist and discussed lab and imaging findings  Critical Interventions: None  After the interventions stated above, I reevaluated the patient and found patient currently to be asymptomatic.  His care is signed out to oncoming provider Dr. Stevie Kern to follow-up on neurology recommendations  Final Clinical Impression(s) / ED Diagnoses Final diagnoses:  Seizure (HCC)  PNA (pneumonia)    Rx / DC Orders ED Discharge Orders    None       Terrilee Files, MD 03/09/21 [phone]

## 2021-03-09 NOTE — ED Notes (Signed)
Pts medications are unverified

## 2021-03-09 NOTE — ED Provider Notes (Signed)
Signout note  86 year old gentleman with dementia presenting to ER with concern for new onset seizure.  Neurology consulted.  3:30 PM Received signout from Dr. Charm Barges, follow-up on recommendations from neurology  5:40 PM Neurologist came to bedside, she recommends admission, EEG, MRI, she will load with Keppra   Milagros Loll, MD 03/09/21 1740

## 2021-03-09 NOTE — ED Notes (Signed)
RN attempted report x1.  

## 2021-03-10 ENCOUNTER — Other Ambulatory Visit: Payer: Self-pay

## 2021-03-10 ENCOUNTER — Observation Stay (HOSPITAL_COMMUNITY): Payer: Medicare Other

## 2021-03-10 DIAGNOSIS — R569 Unspecified convulsions: Secondary | ICD-10-CM

## 2021-03-10 DIAGNOSIS — N401 Enlarged prostate with lower urinary tract symptoms: Secondary | ICD-10-CM

## 2021-03-10 DIAGNOSIS — N138 Other obstructive and reflux uropathy: Secondary | ICD-10-CM | POA: Diagnosis not present

## 2021-03-10 DIAGNOSIS — I10 Essential (primary) hypertension: Secondary | ICD-10-CM | POA: Diagnosis not present

## 2021-03-10 DIAGNOSIS — G301 Alzheimer's disease with late onset: Secondary | ICD-10-CM | POA: Diagnosis not present

## 2021-03-10 DIAGNOSIS — F0281 Dementia in other diseases classified elsewhere with behavioral disturbance: Secondary | ICD-10-CM

## 2021-03-10 LAB — BASIC METABOLIC PANEL
Anion gap: 4 — ABNORMAL LOW (ref 5–15)
BUN: 10 mg/dL (ref 8–23)
CO2: 26 mmol/L (ref 22–32)
Calcium: 8.9 mg/dL (ref 8.9–10.3)
Chloride: 111 mmol/L (ref 98–111)
Creatinine, Ser: 0.93 mg/dL (ref 0.61–1.24)
GFR, Estimated: >60 mL/min (ref >60)
Glucose, Bld: 89 mg/dL (ref 70–99)
Potassium: 4 mmol/L (ref 3.5–5.1)
Sodium: 141 mmol/L (ref 135–145)

## 2021-03-10 LAB — CBC WITH DIFFERENTIAL/PLATELET
Abs Immature Granulocytes: 0.01 10*3/uL (ref 0.00–0.07)
Basophils Absolute: 0 10*3/uL (ref 0.0–0.1)
Basophils Relative: 1 %
Eosinophils Absolute: 0.3 10*3/uL (ref 0.0–0.5)
Eosinophils Relative: 7 %
HCT: 42.2 % (ref 39.0–52.0)
Hemoglobin: 13.3 g/dL (ref 13.0–17.0)
Immature Granulocytes: 0 %
Lymphocytes Relative: 14 %
Lymphs Abs: 0.5 10*3/uL — ABNORMAL LOW (ref 0.7–4.0)
MCH: 26.8 pg (ref 26.0–34.0)
MCHC: 31.5 g/dL (ref 30.0–36.0)
MCV: 85.1 fL (ref 80.0–100.0)
Monocytes Absolute: 0.6 10*3/uL (ref 0.1–1.0)
Monocytes Relative: 16 %
Neutro Abs: 2.4 10*3/uL (ref 1.7–7.7)
Neutrophils Relative %: 62 %
Platelets: 203 10*3/uL (ref 150–400)
RBC: 4.96 MIL/uL (ref 4.22–5.81)
RDW: 13.7 % (ref 11.5–15.5)
WBC: 3.9 10*3/uL — ABNORMAL LOW (ref 4.0–10.5)
nRBC: 0 % (ref 0.0–0.2)

## 2021-03-10 LAB — URINALYSIS, ROUTINE W REFLEX MICROSCOPIC
Bilirubin Urine: NEGATIVE
Glucose, UA: NEGATIVE mg/dL
Hgb urine dipstick: NEGATIVE
Ketones, ur: NEGATIVE mg/dL
Leukocytes,Ua: NEGATIVE
Nitrite: NEGATIVE
Protein, ur: NEGATIVE mg/dL
Specific Gravity, Urine: 1.017 (ref 1.005–1.030)
pH: 7 (ref 5.0–8.0)

## 2021-03-10 LAB — MAGNESIUM: Magnesium: 2.1 mg/dL (ref 1.7–2.4)

## 2021-03-10 MED ORDER — LORAZEPAM 2 MG/ML IJ SOLN
0.5000 mg | Freq: Every day | INTRAMUSCULAR | Status: DC | PRN
  Administered 2021-03-10: 0.5 mg via INTRAVENOUS
  Filled 2021-03-10: qty 1

## 2021-03-10 MED ORDER — GADOBUTROL 1 MMOL/ML IV SOLN
7.0000 mL | Freq: Once | INTRAVENOUS | Status: AC | PRN
  Administered 2021-03-10: 7 mL via INTRAVENOUS

## 2021-03-10 NOTE — Care Management Obs Status (Signed)
MEDICARE OBSERVATION STATUS NOTIFICATION   Patient Details  Name: MOHMMAD SALEEBY MRN: [id number] Date of Birth: 1935-06-01   Medicare Observation Status Notification Given:  Yes    Kermit Balo, RN 03/10/2021, 3:28 PM

## 2021-03-10 NOTE — Procedures (Signed)
Patient Name: [name] [name]  MRN: [id number]  Epilepsy Attending: Charlsie Quest  Referring Physician/Provider: Dr. Ramiro Harvest Date: 03/08/2021 Duration: 23.30 mins  Patient history: 86 year old male with with seizure-like episode.  EEG to evaluate for seizures.  Level of alertness: Awake, asleep  AEDs during EEG study: Keppra  Technical aspects: This EEG study was done with scalp electrodes positioned according to the 10-20 International system of electrode placement. Electrical activity was acquired at a sampling rate of 500Hz  and reviewed with a high frequency filter of 70Hz  and a low frequency filter of 1Hz . EEG data were recorded continuously and digitally stored.   Description: The posterior dominant rhythm consists of 7.5 Hz activity of moderate voltage (25-35 uV) seen predominantly in posterior head regions, symmetric and reactive to eye opening and eye closing. Sleep was characterized by vertex waves, sleep spindles (12 to 14 Hz), maximal frontocentral region. EEG also showed continuous generalized 5 to 7 Hz theta slowing.  Hyperventilation and photic stimulation were not performed.     ABNORMALITY -Continuous slow, generalized  IMPRESSION: This study is suggestive of mild diffuse encephalopathy, nonspecific etiology.  No seizures or epileptiform discharges were seen throughout the recording.  [name] 

## 2021-03-10 NOTE — Progress Notes (Signed)
PROGRESS NOTE    [name] [name]  MGQ:[id number] DOB: 13-May-1935 DOA: 03/09/2021 PCP: [name], MD    Chief Complaint  Patient presents with  . Seizures    Brief Narrative:  Patient 86 year old gentleman history of advanced dementia, BPH, hypertension, glaucoma presented to the ED with new onset seizures.  Head CT done negative for any acute abnormalities.  Urinalysis pending.  Chest x-ray negative for any acute abnormalities.  EEG done.  MRI brain pending.  Neurology consulted and patient started on IV Keppra.   Assessment & Plan:   Principal Problem:   Seizure [facility]) Active Problems:   Essential hypertension   BPH with urinary obstruction   Dementia (HCC)  #1 new onset seizure Questionable etiology.  Head CT done with no acute abnormality, moderate atrophy, chronic microvascular ischemic changes, extra-axial dural based calcification right frontotemporal lobe chronic and unchanged.  Possible meningioma.  No brain edema or mass-effect. -EEG done with continuous slow generalized suggestive of mild diffuse encephalopathy, nonspecific etiology.  No seizures or epileptiform discharges seen throughout the recording. -MRI brain pending. -Patient loaded with IV Keppra on admission currently on Keppra twice daily. -Neurology following.  2.  Advanced dementia with visual hallucinations  -It is noted per admitting physician at baseline patient cannot bathe himself and sometimes needs help getting dressed. -Continue Aricept, Namenda, risperidone. -Per neurology.  3.  Hypertension Stable.  On Cardizem.  4.  BPH Continue Hytrin.    DVT prophylaxis: Lovenox Code Status: Full Family Communication: Updated patient.  No family at bedside. Disposition:   Status is: Observation    Dispo: The patient is from: Home              Anticipated d/c is to: Home              Patient currently undergoing work-up for seizures.  Not stable for discharge.   Difficult to place patient  no       Consultants:   Neurology: Dr. Selina Cooley 03/09/2021  Procedures:   EEG 03/10/2021  MRI brain 03/10/2021  CT head 03/09/2021  Chest x-ray 03/09/2021  Antimicrobials:   None   Subjective: Sitting up in bed.  Just returned from MRI.  Eating lunch.  Denies any chest pain.  No shortness of breath.  No seizure-like activities noted per RN today.  Objective: Vitals:   03/09/21 2307 03/10/21 0338 03/10/21 0833 03/10/21 1524  BP: (!) 147/66 126/67 (!) 157/77 134/66  Pulse: 61 (!) 50 68 (!) 48  Resp: 16 18 16 16   Temp: 97.7 F (36.5 C) 97.8 F (36.6 C) 98 F (36.7 C) (!) 97.5 F (36.4 C)  TempSrc: Oral Oral Oral Oral  SpO2: 100% 100% 100% 100%    Intake/Output Summary (Last 24 hours) at 03/10/2021 2010 Last data filed at 03/10/2021 1750 Gross per 24 hour  Intake 490.35 ml  Output 800 ml  Net -309.65 ml   There were no vitals filed for this visit.  Examination:  General exam: Appears calm and comfortable  Respiratory system: Clear to auscultation. Respiratory effort normal. Cardiovascular system: S1 & S2 heard, RRR. No JVD, murmurs, rubs, gallops or clicks. No pedal edema. Gastrointestinal system: Abdomen is nondistended, soft and nontender. No organomegaly or masses felt. Normal bowel sounds heard. Central nervous system: Alert and oriented. No focal neurological deficits. Extremities: Symmetric 5 x 5 power. Skin: No rashes, lesions or ulcers Psychiatry: Judgement and insight appear normal. Mood & affect appropriate.     Data Reviewed: I have  personally reviewed following labs and imaging studies  CBC: Recent Labs  Lab 03/09/21 1413 03/10/21 0914  WBC 5.2 3.9*  NEUTROABS 4.2 2.4  HGB 12.9* 13.3  HCT 41.3 42.2  MCV 85.5 85.1  PLT 184 203    Basic Metabolic Panel: Recent Labs  Lab 03/09/21 1413 03/10/21 0914  NA 140 141  K 3.9 4.0  CL 109 111  CO2 26 26  GLUCOSE 178* 89  BUN 14 10  CREATININE 1.03 0.93  CALCIUM 9.8 8.9  MG 2.1 2.1     GFR: Estimated Creatinine Clearance: 66.1 mL/min (by C-G formula based on SCr of 0.93 mg/dL).  Liver Function Tests: No results for input(s): AST, ALT, ALKPHOS, BILITOT, PROT, ALBUMIN in the last 168 hours.  CBG: Recent Labs  Lab 03/09/21 1413 03/09/21 2016  GLUCAP 168* 93     Recent Results (from the past 240 hour(s))  SARS CORONAVIRUS 2 (TAT 6-24 HRS) Nasopharyngeal Nasopharyngeal Swab     Status: None   Collection Time: 03/09/21  5:35 PM   Specimen: Nasopharyngeal Swab  Result Value Ref Range Status   SARS Coronavirus 2 NEGATIVE NEGATIVE Final    Comment: (NOTE) SARS-CoV-2 target nucleic acids are NOT DETECTED.  The SARS-CoV-2 RNA is generally detectable in upper and lower respiratory specimens during the acute phase of infection. Negative results do not preclude SARS-CoV-2 infection, do not rule out co-infections with other pathogens, and should not be used as the sole basis for treatment or other patient management decisions. Negative results must be combined with clinical observations, patient history, and epidemiological information. The expected result is Negative.  Fact Sheet for Patients: HairSlick.no  Fact Sheet for Healthcare Providers: quierodirigir.com  This test is not yet approved or cleared by the [place] FDA and  has been authorized for detection and/or diagnosis of SARS-CoV-2 by FDA under an Emergency Use Authorization (EUA). This EUA will remain  in effect (meaning this test can be used) for the duration of the COVID-19 declaration under Se ction 564(b)(1) of the Act, 21 U.S.C. section 360bbb-3(b)(1), unless the authorization is terminated or revoked sooner.  Performed at [facility], [address] [place], [place] [address]          Radiology Studies: CT HEAD WO CONTRAST  Result Date: 03/09/2021 CLINICAL DATA:  Nontraumatic seizure EXAM: CT HEAD WITHOUT CONTRAST  TECHNIQUE: Contiguous axial images were obtained from the base of the skull through the vertex without intravenous contrast. COMPARISON:  CT head 02/08/2020.  MRI head 02/08/2020 FINDINGS: Brain: Moderate atrophy and ventricular enlargement, stable. Mild white matter hypodensity bilaterally, stable. No acute infarct or hemorrhage Dural calcification in the right frontotemporal lobe is stable from prior studies. No mass-effect on the brain. No underlying bony change in the calvarium. Vascular: Negative for hyperdense vessel Skull: No focal skull lesion. Sinuses/Orbits: Moderate mucosal edema paranasal sinuses. Deformity and collapse of the right maxillary sinus with extensive bony thickening. Question prior trauma or surgery. Bilateral cataract extraction. Other: None IMPRESSION: No acute abnormality Moderate atrophy. Chronic white matter changes consistent with chronic microvascular ischemia Extra-axial dural base calcification in the right frontotemporal lobe is chronic and unchanged. Possible meningioma. No brain edema or mass-effect. Electronically Signed   By: Marlan Palau M.D.   On: 03/09/2021 14:57   MR BRAIN W WO CONTRAST  Result Date: 03/10/2021 CLINICAL DATA:  Seizure. EXAM: MRI HEAD WITHOUT AND WITH CONTRAST TECHNIQUE: Multiplanar, multiecho pulse sequences of the brain and surrounding structures were obtained without and  with intravenous contrast. CONTRAST:  7mL GADAVIST GADOBUTROL 1 MMOL/ML IV SOLN COMPARISON:  Head CT 03/09/2021 and MRI 02/08/2020 FINDINGS: The study is intermittently up to moderately motion degraded. Brain: No acute infarct, intracranial hemorrhage, midline shift, or extra-axial fluid collection is identified. There is plaque-like dural thickening, enhancement, and calcification measuring up to 8 mm in thickness over the right frontotemporal convexity, unchanged from the prior MRI and without associated mass effect on the underlying brain or associated brain edema. T2  hyperintensities in the cerebral white matter bilaterally are unchanged and nonspecific but compatible with mild-to-moderate chronic small vessel ischemic disease. There is moderate cerebral atrophy. The hippocampi are grossly symmetric in size and signal within limitations of motion. Vascular: Major intracranial vascular flow voids are preserved. Skull and upper cervical spine: Unremarkable bone marrow signal. Sinuses/Orbits: Bilateral cataract extraction. Postsurgical changes in the paranasal sinuses with a chronically small and thick-walled right maxillary sinus. Unchanged left maxillary sinus mucous retention cyst. Clear mastoid air cells. Other: None. IMPRESSION: 1. Motion degraded examination without evidence of an acute intracranial abnormality. 2. Unchanged plaque-like dural thickening, enhancement, and calcification over the right frontotemporal convexity suggestive of a meningioma. No associated mass effect or brain edema. 3. Mild-to-moderate chronic small vessel ischemic disease and moderate cerebral atrophy. Electronically Signed   By: Sebastian AcheAllen  Grady M.D.   On: 03/10/2021 12:53   DG CHEST PORT 1 VIEW  Result Date: 03/09/2021 CLINICAL DATA:  Pneumonia. EXAM: PORTABLE CHEST 1 VIEW COMPARISON:  09/08/2020 FINDINGS: 1710 hours. The lungs are clear without focal pneumonia, edema, pneumothorax or pleural effusion. Interstitial markings are diffusely coarsened with chronic features. Cardiopericardial silhouette is at upper limits of normal for size. The visualized bony structures of the thorax show no acute abnormality. Telemetry leads overlie the chest. IMPRESSION: Chronic interstitial coarsening, most prominent at the bases. No acute cardiopulmonary findings. Electronically Signed   By: Kennith CenterEric  Mansell M.D.   On: 03/09/2021 17:19   EEG adult  Result Date: 03/10/2021 Charlsie QuestYadav, Priyanka O, MD     03/10/2021  8:51 AM Patient Name: [name] [name] MRN: [id number] Epilepsy Attending: Charlsie QuestPriyanka O Yadav Referring  Physician/Provider: Dr. Ramiro Harvestaniel Anwar Sakata Date: 03/08/2021 Duration: 23.30 mins Patient history: 86 year old male with with seizure-like episode.  EEG to evaluate for seizures. Level of alertness: Awake, asleep AEDs during EEG study: Keppra Technical aspects: This EEG study was done with scalp electrodes positioned according to the 10-20 International system of electrode placement. Electrical activity was acquired at a sampling rate of 500Hz  and reviewed with a high frequency filter of 70Hz  and a low frequency filter of 1Hz . EEG data were recorded continuously and digitally stored. Description: The posterior dominant rhythm consists of 7.5 Hz activity of moderate voltage (25-35 uV) seen predominantly in posterior head regions, symmetric and reactive to eye opening and eye closing. Sleep was characterized by vertex waves, sleep spindles (12 to 14 Hz), maximal frontocentral region. EEG also showed continuous generalized 5 to 7 Hz theta slowing.  Hyperventilation and photic stimulation were not performed.   ABNORMALITY -Continuous slow, generalized IMPRESSION: This study is suggestive of mild diffuse encephalopathy, nonspecific etiology.  No seizures or epileptiform discharges were seen throughout the recording. Priyanka Annabelle Harman Yadav        Scheduled Meds: . brinzolamide  1 drop Both Eyes TID  . diltiazem  240 mg Oral Daily  . donepezil  5 mg Oral QHS  . enoxaparin (LOVENOX) injection  40 mg Subcutaneous Q24H  . latanoprost  1 drop Both Eyes QHS  .  loratadine  10 mg Oral Daily  . memantine  5 mg Oral QHS  . multivitamin with minerals  1 tablet Oral Daily  . Netarsudil Dimesylate  1 drop Both Eyes QHS  . omega-3 acid ethyl esters  1 g Oral Daily  . risperiDONE  0.5 mg Oral QHS  . tamsulosin  0.4 mg Oral Daily  . terazosin  4 mg Oral QHS   Continuous Infusions: . sodium chloride 75 mL/hr (03/10/21 0500)  . levETIRAcetam 500 mg (03/10/21 1643)     LOS: 0 days    Time spent: 35 minutes    Ramiro Harvest, MD [facility]   To contact the attending provider between 7A-7P or the covering provider during after hours 7P-7A, please log into the web site [email or web address] and access using universal [place] password for that web site. If you do not have the password, please call the hospital operator.  03/10/2021, 8:10 PM

## 2021-03-10 NOTE — TOC Initial Note (Signed)
Transition of Care [facility]) - Initial/Assessment Note    Patient Details  Name: [name] [name] MRN: [id number] Date of Birth: January 16, 1935  [facility] CM/SW Contact:    Kermit Balo, RN Phone Number: 03/10/2021, 3:33 PM  Clinical Narrative:                 Pt sleeping but wife at the bedside. She states he has no DME at home. He has no issues with home medications.  Pt is already active with [facility] for therapies. CM updated Eber Jones with [facility] of admission and resumption orders.  Wife states she has a girl that drives for her that can assist the patient in his transportation needs.  TOC following.  Expected Discharge Plan: Home w Home Health Services Barriers to Discharge: Continued Medical Work up   Patient Goals and CMS Choice   CMS Medicare.gov Compare Post Acute Care list provided to:: Patient Represenative (must comment) Choice offered to / list presented to : Spouse  Expected Discharge Plan and Services Expected Discharge Plan: Home w Home Health Services   Discharge Planning Services: CM Consult Post Acute Care Choice: Home Health Living arrangements for the past 2 months: Single Family Home                           HH Arranged: PT,OT HH Agency: [facility]) Date [facility] Agency Contacted: 03/10/21   Representative spoke with at [facility] Agency: Eber Jones  Prior Living Arrangements/Services Living arrangements for the past 2 months: Single Family Home Lives with:: Spouse Patient language and need for interpreter reviewed:: Yes Do you feel safe going back to the place where you live?: Yes      Need for Family Participation in Patient Care: Yes (Comment) Care giver support system in place?: Yes (comment)   Criminal Activity/Legal Involvement Pertinent to Current Situation/Hospitalization: No - Comment as needed  Activities of Daily Living Home Assistive Devices/Equipment: Walker (specify type) ADL Screening  (condition at time of admission) Patient's cognitive ability adequate to safely complete daily activities?: No Is the patient deaf or have difficulty hearing?: No Does the patient have difficulty seeing, even when wearing glasses/contacts?: No Does the patient have difficulty concentrating, remembering, or making decisions?: Yes Patient able to express need for assistance with ADLs?: Yes Does the patient have difficulty dressing or bathing?: Yes Independently performs ADLs?: Yes (appropriate for developmental age) Does the patient have difficulty walking or climbing stairs?: Yes Weakness of Legs: Both Weakness of Arms/Hands: Both  Permission Sought/Granted                  Emotional Assessment Appearance:: Appears stated age         Psych Involvement: No (comment)  Admission diagnosis:  Seizure (HCC) [R56.9] PNA (pneumonia) [J18.9] Patient Active Problem List   Diagnosis Date Noted  . Seizure (HCC) 03/09/2021  . Dementia (HCC) 02/25/2021  . BPH with urinary obstruction 12/22/2020  . Ankle edema, bilateral 07/13/2020  . Hallucinations 02/09/2020  . Abdominal pain, acute 08/21/2014  . GI bleeding 05/26/2012  . Renal mass 05/26/2012  . Diverticulosis of colon with hemorrhage 05/26/2012  . SHOULDER PAIN, BILATERAL 07/30/2010  . HERPES SIMPLEX INFECTION 10/10/2007  . TACHYCARDIA, PAROXYSMAL NOS 10/10/2007  . Dyslipidemia 07/11/2007  . Essential hypertension 07/11/2007  . ALLERGIC RHINITIS 07/11/2007   PCP:  [name], MD Pharmacy:   [id number] - [place],  [place] - [address] [place] [address] Phone: [phone] Fax: [phone]  [facility] - [place], [place] - [address] AT Portal to Registered [employer] Sites [address] [place] [address] Phone: [phone] Fax: [phone]     Social Determinants of Health (SDOH) Interventions    Readmission Risk  Interventions No flowsheet data found.

## 2021-03-10 NOTE — Progress Notes (Signed)
Neurology Progress Note Hospitalist: Ramiro Harvest, MD CC: seizure like activity leading to hospital presentation Subjective: On exam this AM patient is pleasant and reports feeling well rested. Patient denies hallucinations overnight. Patient memory remains poor 2/2 his dementia. Patient cannot recall eating dinner or breakfast but is aware that he is in the hospital. Patient did receive his EEG and per RN notes patient was not observed to have any stoke like symptoms overnight. Patient does not report any pain this AM.  Exam: Vitals:   03/10/21 0338 03/10/21 0833  BP: 126/67 (!) 157/77  Pulse: (!) 50 68  Resp: 18 16  Temp: 97.8 F (36.6 C) 98 F (36.7 C)  SpO2: 100% 100%   Gen: In bed, NAD Resp: non-labored breathing, no acute distress Abd: soft, nt Extremities: Warm, well perfused Skin: Dry, no rashes or brusing  Mental Status: Patient oriented to person and time. Patient is aware that he is in a hospital, but gave the name of a hospital that does not exist (may have in the past?) Patient is pleasant.  Speech/Language: speech is __clear and coherent___.  Naming, repetition, fluency, and comprehension intact. Cranial Nerves:  II: PERRL_2___mm/brisk. LUQ field remains decreased but R lateral fields appropriate today. III, IV, VI: EOMI w/ chronic horizontal nystagmus. Lid elevation symmetric and full.  V: Sensation is intact to light touch and symmetrical to face. Blinks to threat. VII: Face is symmetric resting and smiling. Able to raise eyebrows.  VIII: Hearing intact to voice IX, X: Palate elevation is symmetric. Phonation normal.  XI: Normal sternocleidomastoid and trapezius muscle strength XII: Tongue protrudes midline without fasciculations.   Motor: 5/5 strength is all muscle groups.  Tone is normal. Bulk is normal. In extremities with the exception of the hands.  Sensation- Intact to light touch bilaterally in all four extremities. Extinction intact.  Coordination: FTN  intact bilaterally. No pronator drift.  DTRs: 2+ throughout.  Gait- deferred, PT entered room and will be assessing  Pertinent Labs: UA, BMP, CBC still pending  EEG: "suggestive of mild diffuse encephalopathy, nonspecific etiology.  No seizures or epileptiform discharges were seen throughout the recording."  Impression:  Mr. Hermelinda Medicus is a 86 yo patient who presented after an event concerning for seizure-like behavior. On exam today patient appears at his presumed baseline. Patient had no noted seizure-like events overnight and EEG concluded patient likely has diffuse encephalopathy which is expected with his level of dementia. Patient physical exam is stable. At this time patient would benefit from continuing his Keppra as his hx does put him at risk for seizures and the description of the event leading to his hospitalization resembles seizure like event. Will also await MRI to better investigate status of his meningioma. Patient CBC noted WBC WNL, but would still like to rule out UTI as this could increase patient risk for seizure like events.   Recommendations: - Continue Keppra 500mg  BID - F/U UA - F/U MRI Brain  , MD PGY-1

## 2021-03-10 NOTE — Evaluation (Signed)
Physical Therapy Evaluation Patient Details Name: [name] [name] MRN: [id number] DOB: 1935/02/20 Today's Date: 03/10/2021   History of Present Illness  86 y/o male presented to ED 3/15 after drooling, "shaking all over his body", and not responding to wife which lasted 3-4 minutes. CT head showing chronic hemangioma R frontotemporal lobe. Admission for seizure workup. Awaiting EEG results. PMH: dementia, HTN, HLD, hyperglycemia, ED  Clinical Impression  PTA, patient lives with wife and reports independence with mobility and ADLs, however per chart review, patient requires assistance for bathing and at times dressing. Patient with hx of dementia, will confirm home setup and PLOF with wife at next session. Currently, patient requires supervision-min guard for bed mobility and transfers and minA for ambulation with no AD due to balance deficits noted below. Patient presents with generalized weakness, impaired balance, decreased activity tolerance, and impaired cognition. Patient will benefit from skilled PT services during acute stay to address listed deficits. Recommend HHPT and intermittent at d/c for safety and balance. Will confirm with family about plan at d/c.     Follow Up Recommendations Home health PT;Supervision - Intermittent    Equipment Recommendations  None recommended by PT    Recommendations for Other Services       Precautions / Restrictions Precautions Precautions: Fall Precaution Comments: seizure Restrictions Weight Bearing Restrictions: No      Mobility  Bed Mobility Overal bed mobility: Needs Assistance Bed Mobility: Supine to Sit;Sit to Supine     Supine to sit: Supervision Sit to supine: Supervision   General bed mobility comments: supervision for safety, no physical assist    Transfers Overall transfer level: Needs assistance Equipment used: None Transfers: Sit to/from Stand Sit to Stand: Min guard         General transfer comment: min guard for  safety from low surface, no physical assist required  Ambulation/Gait Ambulation/Gait assistance: Min assist Gait Distance (Feet): 50 Feet Assistive device: None Gait Pattern/deviations: Step-to pattern;Decreased stride length;Shuffle;Narrow base of support;Trunk flexed;Staggering right Gait velocity: decreased   General Gait Details: short shuffle steps with no AD. Lateral LOB to R following turns with minA to recover. Patient unaware of LOB  Stairs            Wheelchair Mobility    Modified Rankin (Stroke Patients Only)       Balance Overall balance assessment: Mild deficits observed, not formally tested                                           Pertinent Vitals/Pain Pain Assessment: No/denies pain    Home Living Family/patient expects to be discharged to:: Private residence Living Arrangements: Spouse/significant other Available Help at Discharge: Family;Available 24 hours/day Type of Home: House Home Access: Level entry     Home Layout: One level Home Equipment: None Additional Comments: unsure of reliability due to hx of dementia    Prior Function Level of Independence: Needs assistance   Gait / Transfers Assistance Needed: per patient, independent with ambulation with no AD  ADL's / Homemaking Assistance Needed: Per patient, independent with ADLs. Per chart review, patient requires assistance with bathing and at times dressing        Hand Dominance        Extremity/Trunk Assessment   Upper Extremity Assessment Upper Extremity Assessment: Overall WFL for tasks assessed    Lower Extremity Assessment Lower Extremity Assessment: Overall  WFL for tasks assessed       Communication   Communication: No difficulties  Cognition Arousal/Alertness: Awake/alert Behavior During Therapy: WFL for tasks assessed/performed Overall Cognitive Status: History of cognitive impairments - at baseline                                  General Comments: A&Ox4, previously answered orientation questions incorrectly with MD prior to arrival. Patient able to follow commands >75% of time. Increased time for processing and following cues. No family present to determine baseline functioning      General Comments      Exercises     Assessment/Plan    PT Assessment Patient needs continued PT services  PT Problem List Decreased strength;Decreased activity tolerance;Decreased balance;Decreased mobility;Decreased cognition;Decreased safety awareness       PT Treatment Interventions DME instruction;Gait training;Stair training;Functional mobility training;Therapeutic exercise;Therapeutic activities;Balance training;Patient/family education    PT Goals (Current goals can be found in the Care Plan section)  Acute Rehab PT Goals Patient Stated Goal: did not state PT Goal Formulation: Patient unable to participate in goal setting Time For Goal Achievement: 03/24/21 Potential to Achieve Goals: Good    Frequency Min 3X/week   Barriers to discharge        Co-evaluation               AM-PAC PT "6 Clicks" Mobility  Outcome Measure Help needed turning from your back to your side while in a flat bed without using bedrails?: A Little Help needed moving from lying on your back to sitting on the side of a flat bed without using bedrails?: A Little Help needed moving to and from a bed to a chair (including a wheelchair)?: A Little Help needed standing up from a chair using your arms (e.g., wheelchair or bedside chair)?: A Little Help needed to walk in hospital room?: A Little Help needed climbing 3-5 steps with a railing? : A Little 6 Click Score: 18    End of Session Equipment Utilized During Treatment: Gait belt Activity Tolerance: Patient tolerated treatment well Patient left: in bed;with call bell/phone within reach;with bed alarm set Nurse Communication: Mobility status PT Visit Diagnosis: Unsteadiness on feet  (R26.81);Muscle weakness (generalized) (M62.81);Other abnormalities of gait and mobility (R26.89)    Time: 7654-6503 PT Time Calculation (min) (ACUTE ONLY): 23 min   Charges:   PT Evaluation $PT Eval Low Complexity: 1 Low          Cambren Helm A. Dan Humphreys PT, DPT Acute Rehabilitation Services Pager [phone] Office [phone]   Viviann Spare 03/10/2021, 9:29 AM

## 2021-03-11 ENCOUNTER — Other Ambulatory Visit: Payer: Self-pay | Admitting: Internal Medicine

## 2021-03-11 DIAGNOSIS — N401 Enlarged prostate with lower urinary tract symptoms: Secondary | ICD-10-CM | POA: Diagnosis not present

## 2021-03-11 DIAGNOSIS — G301 Alzheimer's disease with late onset: Secondary | ICD-10-CM | POA: Diagnosis not present

## 2021-03-11 DIAGNOSIS — I10 Essential (primary) hypertension: Secondary | ICD-10-CM | POA: Diagnosis not present

## 2021-03-11 DIAGNOSIS — N138 Other obstructive and reflux uropathy: Secondary | ICD-10-CM | POA: Diagnosis not present

## 2021-03-11 LAB — BASIC METABOLIC PANEL
Anion gap: 7 (ref 5–15)
BUN: 10 mg/dL (ref 8–23)
CO2: 24 mmol/L (ref 22–32)
Calcium: 8.7 mg/dL — ABNORMAL LOW (ref 8.9–10.3)
Chloride: 108 mmol/L (ref 98–111)
Creatinine, Ser: 0.93 mg/dL (ref 0.61–1.24)
GFR, Estimated: >60 mL/min (ref >60)
Glucose, Bld: 85 mg/dL (ref 70–99)
Potassium: 3.6 mmol/L (ref 3.5–5.1)
Sodium: 139 mmol/L (ref 135–145)

## 2021-03-11 LAB — MAGNESIUM: Magnesium: 2 mg/dL (ref 1.7–2.4)

## 2021-03-11 LAB — CALCIUM, IONIZED: Calcium, Ionized, Serum: 5 mg/dL (ref 4.5–5.6)

## 2021-03-11 MED ORDER — LEVETIRACETAM 500 MG PO TABS: 500.0000 mg | ORAL_TABLET | Freq: Two times a day (BID) | ORAL | 1 refills | Status: DC

## 2021-03-11 MED ORDER — LEVETIRACETAM 500 MG PO TABS: 500.0000 mg | ORAL_TABLET | Freq: Two times a day (BID) | ORAL | Status: DC

## 2021-03-11 NOTE — Progress Notes (Signed)
Patient being discharged home.  Patient to be transported by his wife.  Discharge instructions and prescription information given to the wife.  IV removed with the catheter intact.  Wife chose to administer keppra tonight with his PM medications instead of a dose before discharge.  Filled Keppra prescription sent with the patient.

## 2021-03-11 NOTE — Discharge Summary (Signed)
Physician Discharge Summary  [name] [name] WCH:[id number] DOB: 1935/02/05 DOA: 03/09/2021  PCP: [name], MD  Admit date: 03/09/2021 Discharge date: 03/11/2021  Time spent: 50 minutes  Recommendations for Outpatient Follow-up:  1. Follow-up with Dr. Karel Jarvis, neurology in 1 to 2 weeks.  On follow-up patient's seizure-like activity will need to be reassessed.  Patient's dementia also need to be followed up upon.  Patient started on Keppra 500 mg twice daily per neurology recommendations.   Discharge Diagnoses:  Principal Problem:   Seizure [facility]) Active Problems:   Essential hypertension   BPH with urinary obstruction   Dementia (HCC)   Discharge Condition: Stable and improved  Diet recommendation: Heart healthy  There were no vitals filed for this visit.  History of present illness:  HPI per Dr. Dyane [name] is a 86 y.o. male with medical history significant of advanced dementia, BPH, HTN, glaucoma, presented with question of new onset seizure.  Wife reported that the morning of admission, she walked into the kitchen found patient sitting on a kitchen chair whole body shaking, and unresponsive.  Episode lasted about 3 to 4 minutes, the patient recovered consciousness but confused.  No loss control of urine or bowel movement.  Unsure about tongue bite.  Unsure about eyeball rolling.  2 weeks ago, neurology start patient on Namenda, patient still been taking 1 pill at bedtime bridging for 1 p.o. twice daily.  Wife reported patient has been tolerating Namenda well.  Currently, patient awake alert oriented to name and place confused about time.  Reported no pain/soreness, no vision changes or headache.  No numbness or weakness of the limbs.  ED Course: CT head showed chronic dural calcifications suspicious for hemangioma right frontotemporal lobe.  Hospital Course:  1 new onset seizure Questionable etiology.  Head CT done with no acute abnormality, moderate atrophy,  chronic microvascular ischemic changes, extra-axial dural based calcification right frontotemporal lobe chronic and unchanged.  Possible meningioma.  No brain edema or mass-effect. -EEG done with continuous slow generalized suggestive of mild diffuse encephalopathy, nonspecific etiology.  No seizures or epileptiform discharges seen throughout the recording. -MRI brain notes stable meningioma with no acute abnormalities. -Patient seen in consultation and followed by neurology throughout the hospitalization.  -Infectious work-up which was done was negative.  Urinalysis done unremarkable.  Chest x-ray done negative.  -On presentation neurology recommended Keppra and patient was loaded with IV Keppra and subsequently placed on IV Keppra twice daily.  -Patient had no further seizure-like episodes during the hospitalization.  -Neurology recommended discharge on Keppra 500 mg twice daily as patient noted to have a reasonable suspicion for seizure based on his history and predisposing factor of known meningioma.  -Outpatient follow-up with primary neurologist, Dr. Karel Jarvis in 1 to 2 weeks.    2.  Advanced dementia with visual hallucinations  -It is noted per admitting physician at baseline patient cannot bathe himself and sometimes needs help getting dressed. -Patient was maintained on home regimen of Aricept, Namenda, risperidone. -Outpatient follow-up with primary neurologist.  3.  Hypertension Remained stable on home regimen Cardizem.  Outpatient follow-up.   4.  BPH Patient maintained on home regimen Hytrin.  Procedures:  EEG 03/10/2021  MRI brain 03/10/2021  CT head 03/09/2021  Chest x-ray 03/09/2021   Consultations:  Neurology: Dr. Selina Cooley 03/09/2021    Discharge Exam: Vitals:   03/11/21 0829 03/11/21 1227  BP: (!) 154/75 140/79  Pulse: (!) 55   Resp: 16 16  Temp: (!) 97.5 F (  36.4 C) 97.8 F (36.6 C)  SpO2: 100% 100%    General: NAD Cardiovascular: RRR Respiratory:  CTAB  Discharge Instructions   Discharge Instructions    Diet - low sodium heart healthy   Complete by: As directed    Increase activity slowly   Complete by: As directed      Allergies as of 03/11/2021      Reactions   Chocolate    Itching if he eats too much chocolate    Ciprofloxacin    Achilles tendon pain    Lipitor [atorvastatin]    Muscle aches       Medication List    TAKE these medications   Azopt 1 % ophthalmic suspension Generic drug: brinzolamide Place 1 drop into both eyes 3 (three) times daily.   bimatoprost 0.01 % Soln Commonly known as: LUMIGAN Place 1 drop into both eyes at bedtime.   cetirizine 10 MG tablet Commonly known as: ZYRTEC Take 10 mg by mouth daily as needed for allergies.   CO Q 10 PO Take 1 capsule by mouth 2 (two) times daily.   diltiazem 240 MG 24 hr capsule Commonly known as: DILACOR XR Take 1 capsule (240 mg total) by mouth daily.   donepezil 10 MG tablet Commonly known as: ARICEPT Take 20 mg by mouth at bedtime.   fish oil-omega-3 fatty acids 1000 MG capsule Take 1 g by mouth daily.   Glucosamine-Chondroitin 500-400 MG Caps Take 1 tablet by mouth 2 (two) times daily.   levETIRAcetam 500 MG tablet Commonly known as: KEPPRA Take 1 tablet (500 mg total) by mouth 2 (two) times daily.   melatonin 5 MG Tabs Take 5 mg by mouth at bedtime.   memantine 10 MG tablet Commonly known as: NAMENDA Take 1 tablet every night for 2 weeks, then increase to 1 tablet twice a day What changed:   how much to take  how to take this  when to take this  additional instructions   multivitamin with minerals tablet Take 1 tablet by mouth 2 (two) times daily.   Rhopressa 0.02 % Soln Generic drug: Netarsudil Dimesylate Place 1 drop into both eyes at bedtime.   risperiDONE 0.5 MG tablet Commonly known as: RISPERDAL Take 1 tablet (0.5 mg total) by mouth at bedtime. What changed: additional instructions   tamsulosin 0.4 MG Caps  capsule Commonly known as: FLOMAX Take 1 capsule (0.4 mg total) by mouth daily.   terazosin 2 MG capsule Commonly known as: HYTRIN Take 2 capsules (4 mg total) by mouth at bedtime.      Allergies  Allergen Reactions  . Chocolate     Itching if he eats too much chocolate   . Ciprofloxacin     Achilles tendon pain   . Lipitor [Atorvastatin]     Muscle aches     Follow-up Information    Van Clines, MD. Schedule an appointment as soon as possible for a visit in 2 week(s).   Specialty: Neurology Why: f/u in 1-2 weeks. Contact information: [address] [place] [address] [phone]                The results of significant diagnostics from this hospitalization (including imaging, microbiology, ancillary and laboratory) are listed below for reference.    Significant Diagnostic Studies: CT HEAD WO CONTRAST  Result Date: 03/09/2021 CLINICAL DATA:  Nontraumatic seizure EXAM: CT HEAD WITHOUT CONTRAST TECHNIQUE: Contiguous axial images were obtained from the base of the skull  through the vertex without intravenous contrast. COMPARISON:  CT head 02/08/2020.  MRI head 02/08/2020 FINDINGS: Brain: Moderate atrophy and ventricular enlargement, stable. Mild white matter hypodensity bilaterally, stable. No acute infarct or hemorrhage Dural calcification in the right frontotemporal lobe is stable from prior studies. No mass-effect on the brain. No underlying bony change in the calvarium. Vascular: Negative for hyperdense vessel Skull: No focal skull lesion. Sinuses/Orbits: Moderate mucosal edema paranasal sinuses. Deformity and collapse of the right maxillary sinus with extensive bony thickening. Question prior trauma or surgery. Bilateral cataract extraction. Other: None IMPRESSION: No acute abnormality Moderate atrophy. Chronic white matter changes consistent with chronic microvascular ischemia Extra-axial dural base calcification in the right frontotemporal lobe is  chronic and unchanged. Possible meningioma. No brain edema or mass-effect. Electronically Signed   By: Marlan Palau M.D.   On: 03/09/2021 14:57   MR BRAIN W WO CONTRAST  Result Date: 03/10/2021 CLINICAL DATA:  Seizure. EXAM: MRI HEAD WITHOUT AND WITH CONTRAST TECHNIQUE: Multiplanar, multiecho pulse sequences of the brain and surrounding structures were obtained without and with intravenous contrast. CONTRAST:  73mL GADAVIST GADOBUTROL 1 MMOL/ML IV SOLN COMPARISON:  Head CT 03/09/2021 and MRI 02/08/2020 FINDINGS: The study is intermittently up to moderately motion degraded. Brain: No acute infarct, intracranial hemorrhage, midline shift, or extra-axial fluid collection is identified. There is plaque-like dural thickening, enhancement, and calcification measuring up to 8 mm in thickness over the right frontotemporal convexity, unchanged from the prior MRI and without associated mass effect on the underlying brain or associated brain edema. T2 hyperintensities in the cerebral white matter bilaterally are unchanged and nonspecific but compatible with mild-to-moderate chronic small vessel ischemic disease. There is moderate cerebral atrophy. The hippocampi are grossly symmetric in size and signal within limitations of motion. Vascular: Major intracranial vascular flow voids are preserved. Skull and upper cervical spine: Unremarkable bone marrow signal. Sinuses/Orbits: Bilateral cataract extraction. Postsurgical changes in the paranasal sinuses with a chronically small and thick-walled right maxillary sinus. Unchanged left maxillary sinus mucous retention cyst. Clear mastoid air cells. Other: None. IMPRESSION: 1. Motion degraded examination without evidence of an acute intracranial abnormality. 2. Unchanged plaque-like dural thickening, enhancement, and calcification over the right frontotemporal convexity suggestive of a meningioma. No associated mass effect or brain edema. 3. Mild-to-moderate chronic small vessel  ischemic disease and moderate cerebral atrophy. Electronically Signed   By: Sebastian Ache M.D.   On: 03/10/2021 12:53   DG CHEST PORT 1 VIEW  Result Date: 03/09/2021 CLINICAL DATA:  Pneumonia. EXAM: PORTABLE CHEST 1 VIEW COMPARISON:  09/08/2020 FINDINGS: 1710 hours. The lungs are clear without focal pneumonia, edema, pneumothorax or pleural effusion. Interstitial markings are diffusely coarsened with chronic features. Cardiopericardial silhouette is at upper limits of normal for size. The visualized bony structures of the thorax show no acute abnormality. Telemetry leads overlie the chest. IMPRESSION: Chronic interstitial coarsening, most prominent at the bases. No acute cardiopulmonary findings. Electronically Signed   By: Kennith Center M.D.   On: 03/09/2021 17:19   EEG adult  Result Date: 03/10/2021 Charlsie Quest, MD     03/10/2021  8:51 AM Patient Name: ROWE WARMAN MRN: [id number] Epilepsy Attending: Charlsie Quest Referring Physician/Provider: Dr. Ramiro Harvest Date: 03/08/2021 Duration: 23.30 mins Patient history: 86 year old male with with seizure-like episode.  EEG to evaluate for seizures. Level of alertness: Awake, asleep AEDs during EEG study: Keppra Technical aspects: This EEG study was done with scalp electrodes positioned according to the 10-20 International system of electrode placement.  Electrical activity was acquired at a sampling rate of 500Hz  and reviewed with a high frequency filter of 70Hz  and a low frequency filter of 1Hz . EEG data were recorded continuously and digitally stored. Description: The posterior dominant rhythm consists of 7.5 Hz activity of moderate voltage (25-35 uV) seen predominantly in posterior head regions, symmetric and reactive to eye opening and eye closing. Sleep was characterized by vertex waves, sleep spindles (12 to 14 Hz), maximal frontocentral region. EEG also showed continuous generalized 5 to 7 Hz theta slowing.  Hyperventilation and photic  stimulation were not performed.   ABNORMALITY -Continuous slow, generalized IMPRESSION: This study is suggestive of mild diffuse encephalopathy, nonspecific etiology.  No seizures or epileptiform discharges were seen throughout the recording. Charlsie QuestPriyanka O Yadav    Microbiology: Recent Results (from the past 240 hour(s))  SARS CORONAVIRUS 2 (TAT 6-24 HRS) Nasopharyngeal Nasopharyngeal Swab     Status: None   Collection Time: 03/09/21  5:35 PM   Specimen: Nasopharyngeal Swab  Result Value Ref Range Status   SARS Coronavirus 2 NEGATIVE NEGATIVE Final    Comment: (NOTE) SARS-CoV-2 target nucleic acids are NOT DETECTED.  The SARS-CoV-2 RNA is generally detectable in upper and lower respiratory specimens during the acute phase of infection. Negative results do not preclude SARS-CoV-2 infection, do not rule out co-infections with other pathogens, and should not be used as the sole basis for treatment or other patient management decisions. Negative results must be combined with clinical observations, patient history, and epidemiological information. The expected result is Negative.  Fact Sheet for Patients: HairSlick.nohttps://www.fda.gov/media/138098/download  Fact Sheet for Healthcare Providers: quierodirigir.comhttps://www.fda.gov/media/138095/download  This test is not yet approved or cleared by the [place] FDA and  has been authorized for detection and/or diagnosis of SARS-CoV-2 by FDA under an Emergency Use Authorization (EUA). This EUA will remain  in effect (meaning this test can be used) for the duration of the COVID-19 declaration under Se ction 564(b)(1) of the Act, 21 U.S.C. section 360bbb-3(b)(1), unless the authorization is terminated or revoked sooner.  Performed at [facility], [address] [place], [place] [id number]      Labs: Basic Metabolic Panel: Recent Labs  Lab 03/09/21 1413 03/10/21 0914 03/11/21 0422  NA 140 141 139  K 3.9 4.0 3.6  CL 109 111 108  CO2 26 26 24    GLUCOSE 178* 89 85  BUN 14 10 10   CREATININE 1.03 0.93 0.93  CALCIUM 9.8 8.9 8.7*  MG 2.1 2.1 2.0   Liver Function Tests: No results for input(s): AST, ALT, ALKPHOS, BILITOT, PROT, ALBUMIN in the last 168 hours. No results for input(s): LIPASE, AMYLASE in the last 168 hours. No results for input(s): AMMONIA in the last 168 hours. CBC: Recent Labs  Lab 03/09/21 1413 03/10/21 0914  WBC 5.2 3.9*  NEUTROABS 4.2 2.4  HGB 12.9* 13.3  HCT 41.3 42.2  MCV 85.5 85.1  PLT 184 203   Cardiac Enzymes: No results for input(s): CKTOTAL, CKMB, CKMBINDEX, TROPONINI in the last 168 hours. BNP: BNP (last 3 results) No results for input(s): BNP in the last 8760 hours.  ProBNP (last 3 results) No results for input(s): PROBNP in the last 8760 hours.  CBG: Recent Labs  Lab 03/09/21 1413 03/09/21 2016  GLUCAP 168* 93       Signed:  Ramiro Harvestaniel Irisha Grandmaison MD.  [facility] 03/11/2021, 2:21 PM

## 2021-03-11 NOTE — TOC Transition Note (Signed)
Transition of Care [facility]) - CM/SW Discharge Note   Patient Details  Name: [name] [name] MRN: [id number] Date of Birth: 13-Sep-1935  Transition of Care [facility]) CM/SW Contact:  Kermit Balo, RN Phone Number: 03/11/2021, 2:39 PM   Clinical Narrative:    Patient discharging home with resumption of HH services through [facility]. Eber Jones with [facility] aware of d/c. Medications for d/c to be delivered to the room per [facility] pharmacy.  Pt has transportation home.   Final next level of care: Home w Home Health Services Barriers to Discharge: No Barriers Identified   Patient Goals and CMS Choice   CMS Medicare.gov Compare Post Acute Care list provided to:: Patient Represenative (must comment) Choice offered to / list presented to : Spouse  Discharge Placement                       Discharge Plan and Services   Discharge Planning Services: CM Consult Post Acute Care Choice: Home Health                    HH Arranged: RN,Nurse's Aide [facility] Agency: [facility]) Date [facility] Agency Contacted: 03/10/21   Representative spoke with at [facility] Agency: Eber Jones  Social Determinants of Health (SDOH) Interventions     Readmission Risk Interventions No flowsheet data found.

## 2021-03-11 NOTE — Progress Notes (Signed)
Physical Therapy Treatment Patient Details Name: [name] [name] MRN: [id number] DOB: [birth year] Today's Date: 03/11/2021    History of Present Illness 86 y/o male presented to ED 3/15 after drooling, "shaking all over his body", and not responding to wife which lasted 3-4 minutes. CT head showing chronic hemangioma R frontotemporal lobe. Admission for seizure workup. Awaiting EEG results. PMH: dementia, HTN, HLD, hyperglycemia, ED    PT Comments    Patient progressing towards physical therapy goals. Patient with improved gait quality and balance with addition of IV pole. Educated patient to utilize cane at home for assist with balance and safety, patient verbalized understanding. Performed seated and standing exercises. Continue to recommend HHPT following discharge for further physical therapy.    Follow Up Recommendations  Home health PT;Supervision - Intermittent     Equipment Recommendations  Cane    Recommendations for Other Services       Precautions / Restrictions Precautions Precautions: Fall Precaution Comments: seizure Restrictions Weight Bearing Restrictions: No    Mobility  Bed Mobility Overal bed mobility: Needs Assistance Bed Mobility: Supine to Sit;Sit to Supine     Supine to sit: Supervision Sit to supine: Supervision   General bed mobility comments: supervision for safety, no physical assist    Transfers Overall transfer level: Needs assistance Equipment used: None Transfers: Sit to/from Stand Sit to Stand: Min assist         General transfer comment: minA for boost up from low surface  Ambulation/Gait Ambulation/Gait assistance: Min guard Gait Distance (Feet): 60 Feet Assistive device: IV Pole Gait Pattern/deviations: Step-to pattern;Decreased stride length;Shuffle;Narrow base of support;Trunk flexed;Staggering right Gait velocity: decreased   General Gait Details: improved gait quality and balance from previous session with addition of  using IV pole for ambulation. Educated patient on use of cane upon discharging home for safety and balance, patient verbalized understanding   Stairs             Wheelchair Mobility    Modified Rankin (Stroke Patients Only)       Balance Overall balance assessment: Mild deficits observed, not formally tested                                          Cognition Arousal/Alertness: Awake/alert Behavior During Therapy: WFL for tasks assessed/performed Overall Cognitive Status: History of cognitive impairments - at baseline                                 General Comments: A&Ox4, however decreased awareness of need for being in the hospital.      Exercises General Exercises - Lower Extremity Long Arc Quad: Both;10 reps;Seated Hip ABduction/ADduction: Both;10 reps;Seated Hip Flexion/Marching: Both;10 reps;Seated;Standing    General Comments        Pertinent Vitals/Pain Pain Assessment: No/denies pain    Home Living                      Prior Function            PT Goals (current goals can now be found in the care plan section) Acute Rehab PT Goals Patient Stated Goal: did not state PT Goal Formulation: Patient unable to participate in goal setting Time For Goal Achievement: 03/24/21 Potential to Achieve Goals: Good Progress towards PT goals: Progressing toward goals  Frequency    Min 3X/week      PT Plan Current plan remains appropriate    Co-evaluation              AM-PAC PT "6 Clicks" Mobility   Outcome Measure  Help needed turning from your back to your side while in a flat bed without using bedrails?: A Little Help needed moving from lying on your back to sitting on the side of a flat bed without using bedrails?: A Little Help needed moving to and from a bed to a chair (including a wheelchair)?: A Little Help needed standing up from a chair using your arms (e.g., wheelchair or bedside chair)?: A  Little Help needed to walk in hospital room?: A Little Help needed climbing 3-5 steps with a railing? : A Little 6 Click Score: 18    End of Session Equipment Utilized During Treatment: Gait belt Activity Tolerance: Patient tolerated treatment well Patient left: in bed;with call bell/phone within reach;with bed alarm set Nurse Communication: Mobility status PT Visit Diagnosis: Unsteadiness on feet (R26.81);Muscle weakness (generalized) (M62.81);Other abnormalities of gait and mobility (R26.89)     Time: 1191-4782 PT Time Calculation (min) (ACUTE ONLY): 23 min  Charges:  $Gait Training: 8-22 mins $Therapeutic Exercise: 8-22 mins                     [name] [name] A. [name] [name] PT, DPT Acute Rehabilitation Services Pager [phone] Office [phone]    [name] [name] 03/11/2021, 1:40 PM

## 2021-03-12 ENCOUNTER — Telehealth: Payer: Self-pay

## 2021-03-12 LAB — URINE CULTURE: Culture: <10000 — AB

## 2021-03-12 NOTE — Telephone Encounter (Signed)
OT therapist from [facility] home health called for verbal orders for 1 time a week for 5 weeks, verbal orders given,

## 2021-03-15 ENCOUNTER — Telehealth: Payer: Self-pay | Admitting: Neurology

## 2021-03-15 NOTE — Telephone Encounter (Signed)
I have added the patient to the wait list since he is scheduled for a follow up on 09/13/21 , but I didn't know if he needed to be worked in or not?

## 2021-03-15 NOTE — Telephone Encounter (Signed)
Please see, follow up appt, when can it be scheduled?

## 2021-03-16 NOTE — Telephone Encounter (Signed)
Ok for Monday 3/28 at 11:30am, thanks

## 2021-03-16 NOTE — Telephone Encounter (Signed)
Patient's wife called to see if the patient can be worked in?

## 2021-03-16 NOTE — Telephone Encounter (Signed)
Patients wife is requesting an appt, any insight?

## 2021-03-17 ENCOUNTER — Telehealth: Payer: Self-pay | Admitting: Family Medicine

## 2021-03-17 ENCOUNTER — Encounter: Payer: Self-pay | Admitting: Family Medicine

## 2021-03-17 NOTE — Telephone Encounter (Signed)
Aundra Millet is calling and is requesting verbal orders for a clean catch urine because patient is having frequent urination, please advise. CB is [phone] Ok to leave message.

## 2021-03-17 NOTE — Telephone Encounter (Signed)
Please okay the order  

## 2021-03-18 NOTE — Telephone Encounter (Signed)
Spoke with Aundra Millet, gave verbal order per Dr. Clent Ridges

## 2021-03-22 ENCOUNTER — Telehealth: Payer: Self-pay

## 2021-03-22 ENCOUNTER — Ambulatory Visit (INDEPENDENT_AMBULATORY_CARE_PROVIDER_SITE_OTHER): Payer: Medicare Other | Admitting: Neurology

## 2021-03-22 ENCOUNTER — Encounter: Payer: Self-pay | Admitting: Neurology

## 2021-03-22 ENCOUNTER — Other Ambulatory Visit: Payer: Self-pay

## 2021-03-22 VITALS — BP 113/71 | HR 73 | Resp 18 | Ht 73.0 in | Wt 170.0 lb

## 2021-03-22 DIAGNOSIS — R569 Unspecified convulsions: Secondary | ICD-10-CM | POA: Diagnosis not present

## 2021-03-22 DIAGNOSIS — F0391 Unspecified dementia with behavioral disturbance: Secondary | ICD-10-CM | POA: Diagnosis not present

## 2021-03-22 DIAGNOSIS — F03B18 Unspecified dementia, moderate, with other behavioral disturbance: Secondary | ICD-10-CM

## 2021-03-22 MED ORDER — DIVALPROEX SODIUM ER 250 MG PO TB24: ORAL_TABLET | ORAL | 6 refills | Status: DC

## 2021-03-22 MED ORDER — MEMANTINE HCL 10 MG PO TABS: ORAL_TABLET | ORAL | 3 refills | Status: DC
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
## 2021-03-22 NOTE — Patient Instructions (Signed)
1. Start Depakote 250mg : Take 1 tablet every evening for 1 week, then increase to 2 tablets every evening  2. Once you start the Depakote, start reducing the Keppra (Levetiracetam) to 1 tablet every daily for 1 week, then stop it.  3. Continue all your other medications  4. Recommend having an aide to come help every night  5. Follow-up as scheduled in September, call for any changes

## 2021-03-22 NOTE — Telephone Encounter (Signed)
Calling for PT verbal orders 1 X 4 weeks call back number [phone]

## 2021-03-22 NOTE — Telephone Encounter (Signed)
That is fine, thanks 

## 2021-03-23 ENCOUNTER — Telehealth: Payer: Self-pay | Admitting: Neurology

## 2021-03-23 NOTE — Telephone Encounter (Signed)
Dr.Aqunio the wife wanted to know in the mean time should she continue giving the pt the other medication until the Pa is approved. I advised her it may take up to 72 hours to be approved.

## 2021-03-23 NOTE — Telephone Encounter (Signed)
sherice was called back with PT verbal orders 1 X 4 weeks

## 2021-03-23 NOTE — Telephone Encounter (Signed)
Yes, pls have her continue the Keppra until we can get Depakote approved, thanks

## 2021-03-23 NOTE — Telephone Encounter (Signed)
Telephone call to pt Wife, Wife advised to continue taking keppra.

## 2021-03-23 NOTE — Telephone Encounter (Signed)
I submitted PA yesterday and received denial for the medication. I have faxed an appeal this morning to insurance. Awaiting determination. Thanks!

## 2021-03-26 ENCOUNTER — Telehealth: Payer: Self-pay | Admitting: Neurology

## 2021-03-26 ENCOUNTER — Telehealth: Payer: Self-pay | Admitting: Family Medicine

## 2021-03-26 NOTE — Telephone Encounter (Signed)
Please tell her that insurance will not pay for a home health aide. These people can come for a short time after an illness or surgery, for example, but his dementia unfortunately will continue to get worse. She needs to either try to find someone to help her and pay them cash, or else look for a memory care facility to place him in

## 2021-03-26 NOTE — Telephone Encounter (Signed)
Has she tried to contact the home aid/caregiver services yet? She needs to have someone at home to help, or to start looking with her family into nursing home for him at this point. It is becoming a safety issue for both of them. Thanks

## 2021-03-26 NOTE — Telephone Encounter (Signed)
Spoke wife and gave her Dr Aquino's recommendations she voiced understanding. She states she has left message for caregiver services but is waiting to hear back. She states the patient is in the bathroom and has been trying to stand for the last thirty minutes. She states the patients legs are not doing what she tells them to do.   Wife thinks the patient may need some rehab. Wife wants to know if Dr Karel Jarvis stopped therapy. I told her I did not think so but I would ask Dr Karel Jarvis.

## 2021-03-26 NOTE — Telephone Encounter (Signed)
Patient's wife called and said, "He is telling me that his body needs a bowel movement and he should try to dig it out. It is getting to me and I am exhausted. We had to call EMS twice yesterday because he fell and hit his head. He is leaving his bowel movement all over the bed last night and the bathroom during the night last night."  She needs help and is requesting advice on how best to proceed.

## 2021-03-26 NOTE — Telephone Encounter (Signed)
Patient wife is calling back regarding previous message left for patient on my chart. Wife stated that patient needs help with ADL's and is requesting a home health aide to come out for a couple of hours a day. Wife stated that patient has had 3 falls within the last 3 days and she had to call the paramedics to help get him off the floor. Patient has become incontinent at times and have trouble getting around and is requesting a call back, please advise. CB is [phone]

## 2021-03-29 ENCOUNTER — Telehealth: Payer: Self-pay

## 2021-03-29 ENCOUNTER — Other Ambulatory Visit: Payer: Self-pay | Admitting: Neurology

## 2021-03-29 MED ORDER — DIVALPROEX SODIUM ER 500 MG PO TB24: ORAL_TABLET | ORAL | 6 refills | Status: DC

## 2021-03-29 NOTE — Telephone Encounter (Signed)
Spoke with pt wife verbalized Dr Clent Ridges recommendation

## 2021-03-29 NOTE — Telephone Encounter (Signed)
Fax received from [employer]: Drug Depakote ER 250 mg: Can we do 500 mg ER, if so please resend Script with instructions.

## 2021-03-29 NOTE — Telephone Encounter (Signed)
Done, thanks

## 2021-04-07 ENCOUNTER — Telehealth: Payer: Self-pay | Admitting: Family Medicine

## 2021-04-07 ENCOUNTER — Other Ambulatory Visit: Payer: Self-pay

## 2021-04-07 ENCOUNTER — Telehealth: Payer: Self-pay

## 2021-04-07 DIAGNOSIS — F0391 Unspecified dementia with behavioral disturbance: Secondary | ICD-10-CM

## 2021-04-07 DIAGNOSIS — F03B18 Unspecified dementia, moderate, with other behavioral disturbance: Secondary | ICD-10-CM

## 2021-04-07 MED ORDER — DILTIAZEM HCL ER COATED BEADS 120 MG PO CP24: 120.0000 mg | ORAL_CAPSULE | Freq: Every day | ORAL | 5 refills | Status: DC

## 2021-04-07 NOTE — Telephone Encounter (Signed)
Therapy went to the home today to work with the pt and he was found in the floor this was the 2nd fall this morning, they had to get the neighbor to help get him up. Pt also had a fall yesterday EMS was called to evaluate him. [place] with therapy stated pt did hit his head he has a read spot with a bump at the base of his head no pain or other injuries noted. She stated that his vision is fine. [place] feels that pt is not safe at home he needs to be placed in memory care, because pt wife Is unable to care for him he needs someone near him and reminders to use walker or wheel chair and needs the safety reminders before he falls and really gets hurt

## 2021-04-07 NOTE — Telephone Encounter (Signed)
Physical therapist came today and stated his bp is 90/60 and fell to time today and one time yesterday.want to know what to do.

## 2021-04-07 NOTE — Telephone Encounter (Signed)
His BP medication may be too strong. We will reduce the dose of the Diltiazem CD to 120 mg daily. Call in #30 with 5 rf. Give [place] a report back in one week

## 2021-04-07 NOTE — Telephone Encounter (Signed)
Pls refer to [facility] social worker to help with getting him and wife the resources to move to [facility]. Thanks

## 2021-04-07 NOTE — Telephone Encounter (Signed)
Attempted to call pt this afternoon for more information, no option of leaving a message for pt on the voice mail

## 2021-04-07 NOTE — Telephone Encounter (Signed)
Therapy called back stated BP was low 90/60 pt wife called advised to call PCP to inform them that BP was low. Order placed for [facility] social worker

## 2021-04-07 NOTE — Telephone Encounter (Signed)
Spoke with pt wife verbalized understanding the Dr Clent Ridges changed pt BP medication to Diltiazem CD 120 mg daily and to report back to Dr Clent Ridges in 1 week.

## 2021-04-09 ENCOUNTER — Emergency Department (HOSPITAL_COMMUNITY)
Admission: EM | Admit: 2021-04-09 | Discharge: 2021-04-12 | Disposition: A | Discharge status: Skilled Nursing Facility | Payer: Medicare Other | Attending: Emergency Medicine | Admitting: Emergency Medicine

## 2021-04-09 ENCOUNTER — Emergency Department (HOSPITAL_COMMUNITY): Payer: Medicare Other

## 2021-04-09 DIAGNOSIS — Z87891 Personal history of nicotine dependence: Secondary | ICD-10-CM | POA: Diagnosis not present

## 2021-04-09 DIAGNOSIS — R410 Disorientation, unspecified: Secondary | ICD-10-CM | POA: Diagnosis not present

## 2021-04-09 DIAGNOSIS — S0001XA Abrasion of scalp, initial encounter: Secondary | ICD-10-CM | POA: Insufficient documentation

## 2021-04-09 DIAGNOSIS — Z79899 Other long term (current) drug therapy: Secondary | ICD-10-CM | POA: Insufficient documentation

## 2021-04-09 DIAGNOSIS — R296 Repeated falls: Secondary | ICD-10-CM | POA: Insufficient documentation

## 2021-04-09 DIAGNOSIS — Y92009 Unspecified place in unspecified non-institutional (private) residence as the place of occurrence of the external cause: Secondary | ICD-10-CM | POA: Insufficient documentation

## 2021-04-09 DIAGNOSIS — W19XXXA Unspecified fall, initial encounter: Secondary | ICD-10-CM | POA: Diagnosis not present

## 2021-04-09 DIAGNOSIS — F0390 Unspecified dementia without behavioral disturbance: Secondary | ICD-10-CM | POA: Diagnosis not present

## 2021-04-09 DIAGNOSIS — R059 Cough, unspecified: Secondary | ICD-10-CM

## 2021-04-09 DIAGNOSIS — I10 Essential (primary) hypertension: Secondary | ICD-10-CM | POA: Diagnosis not present

## 2021-04-09 DIAGNOSIS — Z20822 Contact with and (suspected) exposure to covid-19: Secondary | ICD-10-CM | POA: Insufficient documentation

## 2021-04-09 DIAGNOSIS — S0990XA Unspecified injury of head, initial encounter: Secondary | ICD-10-CM | POA: Diagnosis present

## 2021-04-09 LAB — URINALYSIS, ROUTINE W REFLEX MICROSCOPIC
Bilirubin Urine: NEGATIVE
Glucose, UA: NEGATIVE mg/dL
Hgb urine dipstick: NEGATIVE
Ketones, ur: 20 mg/dL — AB
Leukocytes,Ua: NEGATIVE
Nitrite: NEGATIVE
Protein, ur: NEGATIVE mg/dL
Specific Gravity, Urine: 1.026 (ref 1.005–1.030)
pH: 6 (ref 5.0–8.0)

## 2021-04-09 LAB — CBC WITH DIFFERENTIAL/PLATELET
Abs Immature Granulocytes: 0.01 10*3/uL (ref 0.00–0.07)
Basophils Absolute: 0 10*3/uL (ref 0.0–0.1)
Basophils Relative: 0 %
Eosinophils Absolute: 0.1 10*3/uL (ref 0.0–0.5)
Eosinophils Relative: 1 %
HCT: 41.9 % (ref 39.0–52.0)
Hemoglobin: 13 g/dL (ref 13.0–17.0)
Immature Granulocytes: 0 %
Lymphocytes Relative: 8 %
Lymphs Abs: 0.4 10*3/uL — ABNORMAL LOW (ref 0.7–4.0)
MCH: 26.6 pg (ref 26.0–34.0)
MCHC: 31 g/dL (ref 30.0–36.0)
MCV: 85.9 fL (ref 80.0–100.0)
Monocytes Absolute: 0.7 10*3/uL (ref 0.1–1.0)
Monocytes Relative: 15 %
Neutro Abs: 3.6 10*3/uL (ref 1.7–7.7)
Neutrophils Relative %: 76 %
Platelets: 227 10*3/uL (ref 150–400)
RBC: 4.88 MIL/uL (ref 4.22–5.81)
RDW: 13.3 % (ref 11.5–15.5)
WBC: 4.8 10*3/uL (ref 4.0–10.5)
nRBC: 0 % (ref 0.0–0.2)

## 2021-04-09 LAB — COMPREHENSIVE METABOLIC PANEL
ALT: 31 U/L (ref 0–44)
AST: 30 U/L (ref 15–41)
Albumin: 3.1 g/dL — ABNORMAL LOW (ref 3.5–5.0)
Alkaline Phosphatase: 52 U/L (ref 38–126)
Anion gap: 6 (ref 5–15)
BUN: 13 mg/dL (ref 8–23)
CO2: 29 mmol/L (ref 22–32)
Calcium: 8.9 mg/dL (ref 8.9–10.3)
Chloride: 105 mmol/L (ref 98–111)
Creatinine, Ser: 1.04 mg/dL (ref 0.61–1.24)
GFR, Estimated: >60 mL/min (ref >60)
Glucose, Bld: 107 mg/dL — ABNORMAL HIGH (ref 70–99)
Potassium: 4.8 mmol/L (ref 3.5–5.1)
Sodium: 140 mmol/L (ref 135–145)
Total Bilirubin: 0.5 mg/dL (ref 0.3–1.2)
Total Protein: 5.6 g/dL — ABNORMAL LOW (ref 6.5–8.1)

## 2021-04-09 LAB — VALPROIC ACID LEVEL: Valproic Acid Lvl: 53 ug/mL (ref 50.0–100.0)

## 2021-04-09 MED ORDER — ADULT MULTIVITAMIN W/MINERALS CH
1.0000 | ORAL_TABLET | Freq: Two times a day (BID) | ORAL | Status: DC
  Administered 2021-04-10 – 2021-04-12 (×3): 1 via ORAL
  Filled 2021-04-09 (×4): qty 1

## 2021-04-09 MED ORDER — CO Q 10 10 MG PO CAPS: ORAL_CAPSULE | Freq: Two times a day (BID) | ORAL | Status: DC

## 2021-04-09 MED ORDER — TERAZOSIN HCL 1 MG PO CAPS
4.0000 mg | ORAL_CAPSULE | Freq: Every day | ORAL | Status: DC
  Administered 2021-04-11 (×2): 4 mg via ORAL
  Filled 2021-04-09 (×2): qty 4

## 2021-04-09 MED ORDER — MELATONIN 5 MG PO TABS
5.0000 mg | ORAL_TABLET | Freq: Every day | ORAL | Status: DC
  Administered 2021-04-11 (×2): 5 mg via ORAL
  Filled 2021-04-09 (×3): qty 1

## 2021-04-09 MED ORDER — GLUCOSAMINE-CHONDROITIN 500-400 MG PO CAPS: 1.0000 | ORAL_CAPSULE | Freq: Two times a day (BID) | ORAL | Status: DC

## 2021-04-09 MED ORDER — DONEPEZIL HCL 10 MG PO TABS
20.0000 mg | ORAL_TABLET | Freq: Every day | ORAL | Status: DC
Start: 2021-04-09 — End: 2021-04-12
  Administered 2021-04-11 (×2): 20 mg via ORAL
  Filled 2021-04-09 (×3): qty 2

## 2021-04-09 MED ORDER — LATANOPROST 0.005 % OP SOLN
1.0000 [drp] | Freq: Every day | OPHTHALMIC | Status: DC
  Administered 2021-04-11: 1 [drp] via OPHTHALMIC
  Filled 2021-04-09 (×2): qty 2.5

## 2021-04-09 MED ORDER — BRINZOLAMIDE 1 % OP SUSP
1.0000 [drp] | Freq: Three times a day (TID) | OPHTHALMIC | Status: DC
  Administered 2021-04-11 – 2021-04-12 (×2): 1 [drp] via OPHTHALMIC
  Filled 2021-04-09 (×2): qty 10

## 2021-04-09 MED ORDER — LEVETIRACETAM 500 MG PO TABS
500.0000 mg | ORAL_TABLET | Freq: Two times a day (BID) | ORAL | Status: DC
  Administered 2021-04-09 – 2021-04-12 (×4): 500 mg via ORAL
  Filled 2021-04-09 (×5): qty 1

## 2021-04-09 MED ORDER — DILTIAZEM HCL ER COATED BEADS 120 MG PO CP24
120.0000 mg | ORAL_CAPSULE | Freq: Every day | ORAL | Status: DC
  Administered 2021-04-10: 120 mg via ORAL
  Filled 2021-04-09 (×5): qty 1

## 2021-04-09 MED ORDER — MEMANTINE HCL 10 MG PO TABS
10.0000 mg | ORAL_TABLET | Freq: Two times a day (BID) | ORAL | Status: DC
  Administered 2021-04-10 – 2021-04-12 (×4): 10 mg via ORAL
  Filled 2021-04-09 (×8): qty 1

## 2021-04-09 MED ORDER — RISPERIDONE 0.5 MG PO TABS
0.5000 mg | ORAL_TABLET | Freq: Every day | ORAL | Status: DC
  Administered 2021-04-11 (×2): 0.5 mg via ORAL
  Filled 2021-04-09 (×2): qty 1

## 2021-04-09 MED ORDER — TAMSULOSIN HCL 0.4 MG PO CAPS
0.4000 mg | ORAL_CAPSULE | Freq: Every day | ORAL | Status: DC
  Administered 2021-04-09 – 2021-04-12 (×3): 0.4 mg via ORAL
  Filled 2021-04-09 (×4): qty 1

## 2021-04-09 MED ORDER — OMEGA-3-ACID ETHYL ESTERS 1 G PO CAPS
1.0000 g | ORAL_CAPSULE | Freq: Every day | ORAL | Status: DC
  Administered 2021-04-10 – 2021-04-12 (×2): 1 g via ORAL
  Filled 2021-04-09 (×6): qty 1

## 2021-04-09 NOTE — ED Triage Notes (Signed)
BIB EMS from home. Patient with hx of dementia, but good historian at this time. Per daughter to EMS, patient has had 3 falls this week. EMS was called out Tuesday for the fall that day and picked him up and put back in bed. No injuries at that time. Patient and family declined transport to ED. Wednesday there was unwitnessed fall and possibly hit back of head, but family did not call EMS. Today no falls occurred, but family want him evaluated. Scaly, dried patch of skin on occiput that they want checked as well. Patient reports that his right knee has been weaker and been giving out, so the patient says that is why he's falling. Gets PT at home services. Patient also with no complaints at this time.

## 2021-04-09 NOTE — ED Provider Notes (Signed)
[facility] EMERGENCY DEPARTMENT Provider Note   CSN: [id number] Arrival date & time: 04/09/21  1153     History Chief Complaint  Patient presents with  . Fall    [name] [name] is a 86 y.o. male.  86 year old male with history of dementia presents from home due to increasing falls.  Patient apparently fell several days ago.  He is receiving home PT due to falls.  Did note abrasion to the back of his head and family called EMS and patient transported here.  Patient self denies any complaints at this time.        Past Medical History:  Diagnosis Date  . Allergy   . Dementia [facility])    sees Dr. Patrcia Dolly   . Diverticulosis of colon (without mention of hemorrhage) 03-01-2004, 04-04-2011   Colonoscopy  . ED (erectile dysfunction)   . Glaucoma    sees Dr. Arvil Chaco   . Hyperglycemia   . Hyperlipidemia   . Hypertension   . Internal hemorrhoids 03-15-1999   Flex Sig   . Irritable bowel     Patient Active Problem List   Diagnosis Date Noted  . Seizure (HCC) 03/09/2021  . Dementia (HCC) 02/25/2021  . BPH with urinary obstruction 12/22/2020  . Ankle edema, bilateral 07/13/2020  . Hallucinations 02/09/2020  . Abdominal pain, acute 08/21/2014  . GI bleeding 05/26/2012  . Renal mass 05/26/2012  . Diverticulosis of colon with hemorrhage 05/26/2012  . SHOULDER PAIN, BILATERAL 07/30/2010  . HERPES SIMPLEX INFECTION 10/10/2007  . TACHYCARDIA, PAROXYSMAL NOS 10/10/2007  . Dyslipidemia 07/11/2007  . Essential hypertension 07/11/2007  . ALLERGIC RHINITIS 07/11/2007    Past Surgical History:  Procedure Laterality Date  . COLONOSCOPY  04-04-11   per Dr. Jarold Motto, clear, no repeats needed  . EYE SURGERY     bilateral cataract extraction per Dr Jettie Pagan  . INGUINAL HERNIA REPAIR    . KNEE ARTHROSCOPY     right knee  . NASAL SINUS SURGERY         Family History  Problem Relation Age of Onset  . Hypertension Mother   . Lung cancer Father   . Colitis  Daughter     Social History   Tobacco Use  . Smoking status: Former Smoker    Quit date: 03/20/1969    Years since quitting: 52.0  . Smokeless tobacco: Never Used  Vaping Use  . Vaping Use: Never used  Substance Use Topics  . Alcohol use: Yes    Alcohol/week: 0.0 standard drinks    Comment: occ  . Drug use: No    Home Medications Prior to Admission medications   Medication Sig Start Date End Date Taking? Authorizing Provider  AZOPT 1 % ophthalmic suspension Place 1 drop into both eyes 3 (three) times daily. 12/18/19   [provider]  bimatoprost (LUMIGAN) 0.01 % SOLN Place 1 drop into both eyes at bedtime.    [provider]  cetirizine (ZYRTEC) 10 MG tablet Take 10 mg by mouth daily as needed for allergies.    [provider]  Coenzyme Q10 (CO Q 10 PO) Take 1 capsule by mouth 2 (two) times daily.    [provider]  diltiazem (DILTIAZEM CD) 120 MG 24 hr capsule Take 1 capsule (120 mg total) by mouth daily. 04/07/21   [name], MD  divalproex (DEPAKOTE ER) 500 MG 24 hr tablet Take 1 tablet every night 03/29/21   Van Clines, MD  donepezil (ARICEPT)  10 MG tablet Take 20 mg by mouth at bedtime. 03/06/21   [provider]  fish oil-omega-3 fatty acids 1000 MG capsule Take 1 g by mouth daily.    [provider]  Glucosamine-Chondroitin 500-400 MG CAPS Take 1 tablet by mouth 2 (two) times daily.    [provider]  levETIRAcetam (KEPPRA) 500 MG tablet TAKE 1 TABLET (500 MG TOTAL) BY MOUTH TWO TIMES DAILY. 03/11/21 03/11/22  [name], MD  melatonin 5 MG TABS Take 5 mg by mouth at bedtime.    [provider]  memantine (NAMENDA) 10 MG tablet Take 1 tablet twice a day 03/22/21   Van Clines, MD  Multiple Vitamins-Minerals (MULTIVITAMIN WITH MINERALS) tablet Take 1 tablet by mouth 2 (two) times daily.    [provider]  RHOPRESSA 0.02 % SOLN Place 1 drop into both eyes at bedtime. 08/17/19    [provider]  risperiDONE (RISPERDAL) 0.5 MG tablet Take 1 tablet (0.5 mg total) by mouth at bedtime. Patient taking differently: Take 0.5 mg by mouth at bedtime. Taking 0.5 tab by mouth daily 02/25/20   [name], MD  tamsulosin (FLOMAX) 0.4 MG CAPS capsule Take 1 capsule (0.4 mg total) by mouth daily. 02/25/21   [name], MD  terazosin (HYTRIN) 2 MG capsule Take 2 capsules (4 mg total) by mouth at bedtime. 02/25/21   [name], MD    Allergies    Chocolate, Ciprofloxacin, and Lipitor [atorvastatin]  Review of Systems   Review of Systems  Unable to perform ROS: Dementia    Physical Exam Updated Vital Signs BP 111/65 (BP Location: Right Arm)   Pulse 60   Temp 98.9 F (37.2 C) (Oral)   Resp 15   Ht 1.854 m (6\' 1" )   Wt 77 kg   SpO2 98%   BMI 22.40 kg/m   Physical Exam Vitals and nursing note reviewed.  Constitutional:      General: He is not in acute distress.    Appearance: Normal appearance. He is well-developed. He is not toxic-appearing.  HENT:     Head: Normocephalic.   Eyes:     General: Lids are normal.     Conjunctiva/sclera: Conjunctivae normal.     Pupils: Pupils are equal, round, and reactive to light.  Neck:     Thyroid: No thyroid mass.     Trachea: No tracheal deviation.  Cardiovascular:     Rate and Rhythm: Normal rate and regular rhythm.     Heart sounds: Normal heart sounds. No murmur heard. No gallop.   Pulmonary:     Effort: Pulmonary effort is normal. No respiratory distress.     Breath sounds: Normal breath sounds. No stridor. No decreased breath sounds, wheezing, rhonchi or rales.  Abdominal:     General: Bowel sounds are normal. There is no distension.     Palpations: Abdomen is soft.     Tenderness: There is no abdominal tenderness. There is no rebound.  Musculoskeletal:        General: No tenderness. Normal range of motion.     Cervical back: Normal range of motion and neck supple.  Skin:    General: Skin is warm  and dry.     Findings: No abrasion or rash.  Neurological:     General: No focal deficit present.     Mental Status: He is alert. He is confused.     GCS: GCS eye subscore is 4. GCS verbal subscore  is 5. GCS motor subscore is 6.     Cranial Nerves: No cranial nerve deficit.     Sensory: No sensory deficit.     Comments: Strength is 5 of 5 in upper as well as lower extremities  Psychiatric:        Attention and Perception: He is inattentive.     ED Results / Procedures / Treatments   Labs (all labs ordered are listed, but only abnormal results are displayed) Labs Reviewed - No data to display  EKG None  Radiology No results found.  Procedures Procedures   Medications Ordered in ED Medications - No data to display  ED Course  I have reviewed the triage vital signs and the nursing notes.  Pertinent labs & imaging results that were available during my care of the patient were reviewed by me and considered in my medical decision making (see chart for details).    MDM Rules/Calculators/A&P                          Had a long discussion with his wife about patient's current condition.  States that over the last several weeks normal ambulating.  They were having home PT come to help him but this has not been occurring for the past several weeks.  They have no other home health services at this time.  Patient's daughter has contacted [facility] for rehab.  Patient's wife states that she has no other help at home and cannot take care of her at home at this time.  Obtain physical therapy consult as well as TOC consult for likely placement Final Clinical Impression(s) / ED Diagnoses Final diagnoses:  None    Rx / DC Orders ED Discharge Orders    None       Lorre Nick, MD 04/09/21 1443

## 2021-04-09 NOTE — NC FL2 (Signed)
[place] MEDICAID FL2 LEVEL OF CARE SCREENING TOOL     IDENTIFICATION  Patient Name: [name] [name] Birthdate: 30-Jul-1935 Sex: male Admission Date (Current Location): 04/09/2021  [facility] and [place] Number:  Producer, television/film/video and Address:  [facility] [place], [address] [place], [place] [address]      Provider Number: [id number]  Attending Physician Name and Address:  Rozelle Logan, DO  Relative Name and Phone Number:  Amick,[name] Spouse  [phone]    Current Level of Care: Hospital Recommended Level of Care: Skilled Nursing Facility Prior Approval Number:    Date Approved/Denied:   PASRR Number: [id number]  Discharge Plan: SNF    Current Diagnoses: Patient Active Problem List   Diagnosis Date Noted  . Seizure (HCC) 03/09/2021  . Dementia (HCC) 02/25/2021  . BPH with urinary obstruction 12/22/2020  . Ankle edema, bilateral 07/13/2020  . Hallucinations 02/09/2020  . Abdominal pain, acute 08/21/2014  . GI bleeding 05/26/2012  . Renal mass 05/26/2012  . Diverticulosis of colon with hemorrhage 05/26/2012  . SHOULDER PAIN, BILATERAL 07/30/2010  . HERPES SIMPLEX INFECTION 10/10/2007  . TACHYCARDIA, PAROXYSMAL NOS 10/10/2007  . Dyslipidemia 07/11/2007  . Essential hypertension 07/11/2007  . ALLERGIC RHINITIS 07/11/2007    Orientation RESPIRATION BLADDER Height & Weight     Self,Situation  Normal Incontinent Weight: 169 lb 12.1 oz (77 kg) Height:  6\' 1"  (185.4 cm)  BEHAVIORAL SYMPTOMS/MOOD NEUROLOGICAL BOWEL NUTRITION STATUS      Incontinent Diet (Regular)  AMBULATORY STATUS COMMUNICATION OF NEEDS Skin   Limited Assist Verbally Normal                       Personal Care Assistance Level of Assistance  Bathing,Feeding,Dressing Bathing Assistance: Limited assistance Feeding assistance: Independent Dressing Assistance: Limited assistance     Functional Limitations Info  Speech,Hearing,Sight Sight Info:  Adequate Hearing Info: Adequate Speech Info: Adequate    SPECIAL CARE FACTORS FREQUENCY  PT (By licensed PT),OT (By licensed OT)     PT Frequency: 5x weekly OT Frequency: 5x weekly            Contractures Contractures Info: Not present    Additional Factors Info  Code Status,Allergies Code Status Info: Full Allergies Info: (3) Chocolate, Ciprofloxacin, Lipitor           Current Medications (04/09/2021):  This is the current hospital active medication list Current Facility-Administered Medications  Medication Dose Route Frequency Provider Last Rate Last Admin  . brinzolamide (AZOPT) 1 % ophthalmic suspension 1 drop  1 drop Both Eyes TID 04/11/2021, MD      . Co Q 10 CAPS   Oral BID Lorre Nick, MD      . diltiazem (CARDIZEM CD) 24 hr capsule 120 mg  120 mg Oral Daily Lorre Nick, MD      . donepezil (ARICEPT) tablet 20 mg  20 mg Oral QHS Lorre Nick, MD      . fish oil-omega-3 fatty acids capsule 1 g  1 g Oral Daily Lorre Nick, MD      . Glucosamine-Chondroitin 500-400 MG CAPS 1 tablet  1 tablet Oral BID Lorre Nick, MD      . latanoprost (XALATAN) 0.005 % ophthalmic solution 1 drop  1 drop Both Eyes QHS Lorre Nick, MD      . levETIRAcetam (KEPPRA) tablet 500 mg  500 mg Oral BID Lorre Nick, MD      . melatonin tablet 5 mg  5 mg Oral QHS Lorre Nick, MD      . memantine [facility]) tablet 10 mg  10 mg Oral BID Lorre Nick, MD      . multivitamin with minerals tablet 1 tablet  1 tablet Oral BID Lorre Nick, MD      . risperiDONE (RISPERDAL) tablet 0.5 mg  0.5 mg Oral QHS Lorre Nick, MD      . tamsulosin [facility]) capsule 0.4 mg  0.4 mg Oral Daily Lorre Nick, MD      . terazosin (HYTRIN) capsule 4 mg  4 mg Oral QHS Lorre Nick, MD       Current Outpatient Medications  Medication Sig Dispense Refill  . AZOPT 1 % ophthalmic suspension Place 1 drop into both eyes 3 (three) times daily.    . bimatoprost (LUMIGAN) 0.01 % SOLN Place 1  drop into both eyes at bedtime.    . cetirizine (ZYRTEC) 10 MG tablet Take 10 mg by mouth daily as needed for allergies.    . Coenzyme Q10 (CO Q 10 PO) Take 1 capsule by mouth 2 (two) times daily.    Marland Kitchen diltiazem (DILTIAZEM CD) 120 MG 24 hr capsule Take 1 capsule (120 mg total) by mouth daily. 30 capsule 5  . divalproex (DEPAKOTE ER) 500 MG 24 hr tablet Take 1 tablet every night 30 tablet 6  . donepezil (ARICEPT) 10 MG tablet Take 20 mg by mouth at bedtime.    . fish oil-omega-3 fatty acids 1000 MG capsule Take 1 g by mouth daily.    . Glucosamine-Chondroitin 500-400 MG CAPS Take 1 tablet by mouth 2 (two) times daily.    Marland Kitchen levETIRAcetam (KEPPRA) 500 MG tablet TAKE 1 TABLET (500 MG TOTAL) BY MOUTH TWO TIMES DAILY. 60 tablet 1  . melatonin 5 MG TABS Take 5 mg by mouth at bedtime.    . memantine (NAMENDA) 10 MG tablet Take 1 tablet twice a day 180 tablet 3  . Multiple Vitamins-Minerals (MULTIVITAMIN WITH MINERALS) tablet Take 1 tablet by mouth 2 (two) times daily.    . RHOPRESSA 0.02 % SOLN Place 1 drop into both eyes at bedtime.    . risperiDONE (RISPERDAL) 0.5 MG tablet Take 1 tablet (0.5 mg total) by mouth at bedtime. (Patient taking differently: Take 0.5 mg by mouth at bedtime. Taking 0.5 tab by mouth daily) 1 tablet 0  . tamsulosin (FLOMAX) 0.4 MG CAPS capsule Take 1 capsule (0.4 mg total) by mouth daily. 90 capsule 3  . terazosin (HYTRIN) 2 MG capsule Take 2 capsules (4 mg total) by mouth at bedtime. 180 capsule 3     Discharge Medications: Please see discharge summary for a list of discharge medications.  Relevant Imaging Results:  Relevant Lab Results:   Additional Information SSN# [id number] / Per Pt's wife Pt has had 2 Covid vaccines, has not had booster yet.  Decari Duggar, LCSW

## 2021-04-09 NOTE — ED Notes (Signed)
RN messaged pharmacy per verification/sending of overdue medications

## 2021-04-09 NOTE — Progress Notes (Signed)
CSW faxed SNF referrals to several area facilities.  Family's first choice is [facility].

## 2021-04-09 NOTE — Evaluation (Signed)
Physical Therapy Evaluation Patient Details Name: [name] [name] MRN: [id number] DOB: August 14, 1935 Today's Date: 04/09/2021   History of Present Illness  86 y/o male presented to ED with more frequent falls, noted no injuries at home but is becoming difficult for family to manage him.  Has been getting HHPT.  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  Safety is an issue due to flexed knee posture, and is unable to stand from any surface without assistance.  Due to recent hsitory of falls, will recommend SNF for safety to finish rehab and work on independence with gait.  Follow for acute PT goals.    Follow Up Recommendations SNF    Equipment Recommendations  None recommended by PT    Recommendations for Other Services       Precautions / Restrictions Precautions Precautions: Fall Precaution Comments: foley catheter Restrictions Weight Bearing Restrictions: No      Mobility  Bed Mobility Overal bed mobility: Needs Assistance Bed Mobility: Supine to Sit;Sit to Supine     Supine to sit: Min assist Sit to supine: Min assist        Transfers Overall transfer level: Needs assistance Equipment used: Rolling walker (2 wheeled);1 person hand held assist Transfers: Sit to/from Stand Sit to Stand: Mod assist;Min assist         General transfer comment: mod from lower height, min from higher surfaces  Ambulation/Gait Ambulation/Gait assistance: Min assist;Mod assist Gait Distance (Feet): 22 Feet (11 x 2) Assistive device: Rolling walker (2 wheeled);1 person hand held assist Gait Pattern/deviations: Decreased stride length;Wide base of support;Drifts right/left;Trunk flexed Gait velocity: reduced   General Gait Details: pt walks in a squatted posture  Stairs            Wheelchair Mobility     Modified Rankin (Stroke Patients Only)       Balance Overall balance assessment: Needs assistance Sitting-balance support: Bilateral upper extremity supported;Feet supported Sitting balance-Leahy Scale: Fair     Standing balance support: Bilateral upper extremity supported;During functional activity Standing balance-Leahy Scale: Poor                               Pertinent Vitals/Pain Pain Assessment: No/denies pain    Home Living Family/patient expects to be discharged to:: Private residence Living Arrangements: Spouse/significant other Available Help at Discharge: Family;Available 24 hours/day Type of Home: House Home Access: Level entry     Home Layout: One level Home Equipment: Walker - 2 wheels Additional Comments: history from chart x pt report of using RW    Prior Function Level of Independence: Needs assistance   Gait / Transfers Assistance Needed: pt reports using RW  ADL's / Homemaking Assistance Needed: pt hs family with him        Hand Dominance   Dominant Hand: Right    Extremity/Trunk Assessment   Upper Extremity Assessment Upper Extremity Assessment: Generalized weakness    Lower Extremity Assessment Lower Extremity Assessment: Generalized weakness    Cervical / Trunk Assessment Cervical / Trunk Assessment: Kyphotic  Communication   Communication: No difficulties  Cognition Arousal/Alertness: Awake/alert Behavior During Therapy: Flat affect Overall Cognitive Status: History of cognitive impairments - at baseline  General Comments General comments (skin integrity, edema, etc.): pt is unable to give inforrmation to PT about home but is reporting recent falls    Exercises     Assessment/Plan    PT Assessment    PT Problem List         PT Treatment Interventions      PT Goals (Current goals can be found in the Care Plan section)  Acute Rehab PT Goals Patient  Stated Goal: none stated PT Goal Formulation: Patient unable to participate in goal setting Time For Goal Achievement: 04/23/21 Potential to Achieve Goals: Fair    Frequency     Barriers to discharge        Co-evaluation               AM-PAC PT "6 Clicks" Mobility  Outcome Measure Help needed turning from your back to your side while in a flat bed without using bedrails?: A Little Help needed moving from lying on your back to sitting on the side of a flat bed without using bedrails?: A Little Help needed moving to and from a bed to a chair (including a wheelchair)?: A Lot Help needed standing up from a chair using your arms (e.g., wheelchair or bedside chair)?: A Lot Help needed to walk in hospital room?: A Lot Help needed climbing 3-5 steps with a railing? : Total 6 Click Score: 13    End of Session Equipment Utilized During Treatment: Gait belt Activity Tolerance: Patient tolerated treatment well          Time: 2355-7322 PT Time Calculation (min) (ACUTE ONLY): 14 min   Charges:   PT Evaluation $PT Eval Moderate Complexity: 1 Mod         Ivar Drape 04/09/2021, 10:23 PM Samul Dada, PT MS Acute Rehab Dept. Number: [facility] [id number] and [facility] [phone]

## 2021-04-09 NOTE — ED Provider Notes (Signed)
Assumed care by previous provider, please refer to their note for full HPI.  Brief this is an 86 year old male who presents to the emergency department for possible placement.  Lab evaluation is baseline without any acute abnormalities, CT the head did not show any acute findings.  Social work has been involved, it appears SNF placement is the goal.  Patient is pending placement and will be signed out to provider Default.  He is unchanged and stable at time of my end of shift.   Rozelle Logan, DO 04/09/21 2242

## 2021-04-10 LAB — SARS CORONAVIRUS 2 (TAT 6-24 HRS): SARS Coronavirus 2: NEGATIVE

## 2021-04-10 MED ORDER — QUETIAPINE FUMARATE 50 MG PO TABS
50.0000 mg | ORAL_TABLET | Freq: Every day | ORAL | Status: DC
  Administered 2021-04-10 – 2021-04-11 (×2): 50 mg via ORAL
  Filled 2021-04-10 (×3): qty 1

## 2021-04-10 NOTE — Progress Notes (Signed)
PHARMACIST - PHYSICIAN ORDER COMMUNICATION  CONCERNING: P&T Medication Policy on Herbal Medications  DESCRIPTION:  This patient's order for:  CoQ10 and glucosamine/chondroitin  has been noted.  This product(s) is classified as an "herbal" or natural product. Due to a lack of definitive safety studies or FDA approval, nonstandard manufacturing practices, plus the potential risk of unknown drug-drug interactions while on inpatient medications, the Pharmacy and Therapeutics Committee does not permit the use of "herbal" or natural products of this type within [facility].   ACTION TAKEN: The pharmacy department is unable to verify this order at this time and your patient has been informed of this safety policy. Please reevaluate patient's clinical condition at discharge and address if the herbal or natural product(s) should be resumed at that time.  Christoper Fabian, PharmD, BCPS Please see amion for complete clinical pharmacist phone list 04/10/2021 5:08 AM

## 2021-04-10 NOTE — ED Notes (Signed)
Pt gets out of bed and starts wandering in the unit. Transferred in the hallway in nursing station to better observe pt. Safety precautions maintained.

## 2021-04-10 NOTE — ED Notes (Signed)
Dialed pt's wife for him- pt spoke with her twice this morning. Pt confused and continues to get out of bed. Moved pt and bed by nurses station to prevent falls.

## 2021-04-10 NOTE — ED Notes (Signed)
Pt is now awake and is getting upset of being in bed. Pt attempted to get out of bed multiple times. Redirected by staff multiple times. Will continue to monitor.

## 2021-04-11 LAB — URINE CULTURE: Culture: NO GROWTH

## 2021-04-11 NOTE — TOC Progression Note (Signed)
Transition of Care [facility]) - Progression Note    Patient Details  Name: [name] [name] MRN: [id number] Date of Birth: 1935/05/23  [facility] CM/SW Contact  Annalee Genta, LCSW Phone Number: 04/11/2021, 8:51 AM  Clinical Narrative:    CSW confirmed bed offere with facility with negative COVID result. CSW notes bed will be available Monday pending insurance authorization. CSW initiated authorization with refID# [id number]   Expected Discharge Plan: Skilled Nursing Facility Barriers to Discharge: No Barriers Identified  Expected Discharge Plan and Services Expected Discharge Plan: Skilled Nursing Facility       Living arrangements for the past 2 months: Single Family Home                                       Social Determinants of Health (SDOH) Interventions    Readmission Risk Interventions No flowsheet data found.

## 2021-04-12 NOTE — ED Notes (Signed)
Report given to [place] W, LPN of [facility]

## 2021-04-12 NOTE — ED Notes (Signed)
[facility] Called 

## 2021-04-12 NOTE — Progress Notes (Signed)
Room number 116. Information to call report giving to nursing.

## 2021-04-12 NOTE — ED Notes (Signed)
Pt attempted to get out of bed. Condom cath came off. New 52mm condom cath placed on pt. Pt assisted to bed. Pt eating a banana at this time.

## 2021-04-12 NOTE — ED Notes (Addendum)
Pt going to [facility] for SNF. Room 116. [phone]

## 2021-04-12 NOTE — ED Notes (Signed)
TOC Placed Breakfast order 

## 2021-04-12 NOTE — Progress Notes (Signed)
Patients insurance authorization for SNF was approved ([id number]). Patient will be going to [facility]. Room number is pending.

## 2021-04-13 NOTE — Telephone Encounter (Signed)
error 

## 2021-04-27 ENCOUNTER — Telehealth: Payer: Self-pay | Admitting: Family Medicine

## 2021-04-27 NOTE — Telephone Encounter (Signed)
[name] [name] is calling and stated that they received an order skilled nursing for physical, occupational and home health. Nurse wanted to see if provider would follow orders, please advise. CB is [phone]

## 2021-04-28 NOTE — Telephone Encounter (Signed)
Please okay these orders  ?

## 2021-04-29 NOTE — Telephone Encounter (Signed)
Spoke with Kenney Houseman with [facility] regarding pt verbal orders approval, verbalized understanding

## 2021-05-01 ENCOUNTER — Emergency Department (HOSPITAL_COMMUNITY): Payer: Medicare Other

## 2021-05-01 ENCOUNTER — Other Ambulatory Visit: Payer: Self-pay

## 2021-05-01 ENCOUNTER — Encounter (HOSPITAL_COMMUNITY): Payer: Self-pay

## 2021-05-01 ENCOUNTER — Emergency Department (HOSPITAL_COMMUNITY)
Admission: EM | Admit: 2021-05-01 | Discharge: 2021-05-01 | Disposition: A | Discharge status: Home / Self Care | Payer: Medicare Other | Attending: Emergency Medicine | Admitting: Emergency Medicine

## 2021-05-01 DIAGNOSIS — I10 Essential (primary) hypertension: Secondary | ICD-10-CM | POA: Insufficient documentation

## 2021-05-01 DIAGNOSIS — F0390 Unspecified dementia without behavioral disturbance: Secondary | ICD-10-CM | POA: Insufficient documentation

## 2021-05-01 DIAGNOSIS — Z79899 Other long term (current) drug therapy: Secondary | ICD-10-CM | POA: Insufficient documentation

## 2021-05-01 DIAGNOSIS — R4182 Altered mental status, unspecified: Secondary | ICD-10-CM | POA: Diagnosis present

## 2021-05-01 DIAGNOSIS — Z87891 Personal history of nicotine dependence: Secondary | ICD-10-CM | POA: Insufficient documentation

## 2021-05-01 LAB — CBC WITH DIFFERENTIAL/PLATELET
Abs Immature Granulocytes: 0.02 K/uL (ref 0.00–0.07)
Basophils Absolute: 0 K/uL (ref 0.0–0.1)
Basophils Relative: 1 %
Eosinophils Absolute: 0.1 K/uL (ref 0.0–0.5)
Eosinophils Relative: 3 %
HCT: 41.4 % (ref 39.0–52.0)
Hemoglobin: 12.6 g/dL — ABNORMAL LOW (ref 13.0–17.0)
Immature Granulocytes: 0 %
Lymphocytes Relative: 10 %
Lymphs Abs: 0.4 K/uL — ABNORMAL LOW (ref 0.7–4.0)
MCH: 26.2 pg (ref 26.0–34.0)
MCHC: 30.4 g/dL (ref 30.0–36.0)
MCV: 86.1 fL (ref 80.0–100.0)
Monocytes Absolute: 0.6 K/uL (ref 0.1–1.0)
Monocytes Relative: 14 %
Neutro Abs: 3.4 K/uL (ref 1.7–7.7)
Neutrophils Relative %: 72 %
Platelets: 261 K/uL (ref 150–400)
RBC: 4.81 MIL/uL (ref 4.22–5.81)
RDW: 13.7 % (ref 11.5–15.5)
WBC: 4.6 K/uL (ref 4.0–10.5)
nRBC: 0 % (ref 0.0–0.2)

## 2021-05-01 LAB — BASIC METABOLIC PANEL WITH GFR
Anion gap: 6 (ref 5–15)
BUN: 14 mg/dL (ref 8–23)
CO2: 28 mmol/L (ref 22–32)
Calcium: 9 mg/dL (ref 8.9–10.3)
Chloride: 106 mmol/L (ref 98–111)
Creatinine, Ser: 1.07 mg/dL (ref 0.61–1.24)
GFR, Estimated: >60 mL/min (ref >60)
Glucose, Bld: 201 mg/dL — ABNORMAL HIGH (ref 70–99)
Potassium: 3.8 mmol/L (ref 3.5–5.1)
Sodium: 140 mmol/L (ref 135–145)

## 2021-05-01 NOTE — Discharge Instructions (Addendum)
Call your primary care doctor or specialist as discussed in the next 2-3 days.   Return immediately back to the ER if:  Your symptoms worsen within the next 12-24 hours. You develop new symptoms such as new fevers, persistent vomiting, new pain, shortness of breath, or new weakness or numbness, or if you have any other concerns.  

## 2021-05-01 NOTE — ED Notes (Signed)
[facility] is here for transport.  

## 2021-05-01 NOTE — ED Provider Notes (Addendum)
[facility]-EMERGENCY DEPT Provider Note   CSN: [id number] Arrival date & time: 05/01/21  1309     History Chief Complaint  Patient presents with  . Altered Mental Status    [name] [name] is a 86 y.o. male.  History of dementia and recent diagnosis of new seizures, presents from home for confusion.  Reportedly the home health nurse stated that the patient was not responding to her and patient was brought to the ER.  No additional reports of fevers or cough or vomiting or diarrhea.  Patient himself states he has no pain is not sure why he is here, unable to obtain additional history given history of dementia.         Past Medical History:  Diagnosis Date  . Allergy   . Dementia [facility])    sees Dr. Patrcia Dolly   . Diverticulosis of colon (without mention of hemorrhage) 03-01-2004, 04-04-2011   Colonoscopy  . ED (erectile dysfunction)   . Glaucoma    sees Dr. Arvil Chaco   . Hyperglycemia   . Hyperlipidemia   . Hypertension   . Internal hemorrhoids 03-15-1999   Flex Sig   . Irritable bowel     Patient Active Problem List   Diagnosis Date Noted  . Seizure (HCC) 03/09/2021  . Dementia (HCC) 02/25/2021  . BPH with urinary obstruction 12/22/2020  . Ankle edema, bilateral 07/13/2020  . Hallucinations 02/09/2020  . Abdominal pain, acute 08/21/2014  . GI bleeding 05/26/2012  . Renal mass 05/26/2012  . Diverticulosis of colon with hemorrhage 05/26/2012  . SHOULDER PAIN, BILATERAL 07/30/2010  . HERPES SIMPLEX INFECTION 10/10/2007  . TACHYCARDIA, PAROXYSMAL NOS 10/10/2007  . Dyslipidemia 07/11/2007  . Essential hypertension 07/11/2007  . ALLERGIC RHINITIS 07/11/2007    Past Surgical History:  Procedure Laterality Date  . COLONOSCOPY  04-04-11   per Dr. Jarold Motto, clear, no repeats needed  . EYE SURGERY     bilateral cataract extraction per Dr Jettie Pagan  . INGUINAL HERNIA REPAIR    . KNEE ARTHROSCOPY     right knee  . NASAL SINUS SURGERY          Family History  Problem Relation Age of Onset  . Hypertension Mother   . Lung cancer Father   . Colitis Daughter     Social History   Tobacco Use  . Smoking status: Former Smoker    Quit date: 03/20/1969    Years since quitting: 52.1  . Smokeless tobacco: Never Used  Vaping Use  . Vaping Use: Never used  Substance Use Topics  . Alcohol use: Yes    Alcohol/week: 0.0 standard drinks    Comment: occ  . Drug use: No    Home Medications Prior to Admission medications   Medication Sig Start Date End Date Taking? Authorizing Provider  AZOPT 1 % ophthalmic suspension Place 1 drop into both eyes 3 (three) times daily. 12/18/19   [provider]  bimatoprost (LUMIGAN) 0.01 % SOLN Place 1 drop into both eyes at bedtime.    [provider]  cetirizine (ZYRTEC) 10 MG tablet Take 10 mg by mouth daily as needed for allergies.    [provider]  Coenzyme Q10 (CO Q 10 PO) Take 1 capsule by mouth 2 (two) times daily.    [provider]  diltiazem (DILTIAZEM CD) 120 MG 24 hr capsule Take 1 capsule (120 mg total) by mouth daily. 04/07/21   [name], MD  divalproex (DEPAKOTE ER) 500 MG  24 hr tablet Take 1 tablet every night Patient taking differently: Take 1,000 mg by mouth at bedtime. 03/29/21   Van Clines, MD  donepezil (ARICEPT) 10 MG tablet Take 20 mg by mouth at bedtime. 03/06/21   [provider]  fish oil-omega-3 fatty acids 1000 MG capsule Take 1 g by mouth daily.    [provider]  Glucosamine-Chondroitin 500-400 MG CAPS Take 1 tablet by mouth daily.    [provider]  levETIRAcetam (KEPPRA) 500 MG tablet TAKE 1 TABLET (500 MG TOTAL) BY MOUTH TWO TIMES DAILY. Patient not taking: No sig reported 03/11/21 03/11/22  [name], MD  melatonin 5 MG TABS Take 5 mg by mouth at bedtime.    [provider]  memantine (NAMENDA) 10 MG tablet Take 1 tablet twice a day Patient taking differently: Take 10  mg by mouth 2 (two) times daily. Take 1 tablet twice a day 03/22/21   Van Clines, MD  MILK THISTLE PO Take 1 capsule by mouth daily.    [provider]  Multiple Vitamins-Minerals (MULTIVITAMIN WITH MINERALS) tablet Take 1 tablet by mouth daily.    [provider]  RHOPRESSA 0.02 % SOLN Place 1 drop into both eyes at bedtime. 08/17/19   [provider]  risperiDONE (RISPERDAL) 0.5 MG tablet Take 1 tablet (0.5 mg total) by mouth at bedtime. 02/25/20   [name], MD  tamsulosin (FLOMAX) 0.4 MG CAPS capsule Take 1 capsule (0.4 mg total) by mouth daily. 02/25/21   [name], MD  terazosin (HYTRIN) 2 MG capsule Take 2 capsules (4 mg total) by mouth at bedtime. 02/25/21   [name], MD    Allergies    Chocolate, Ciprofloxacin, and Lipitor [atorvastatin]  Review of Systems   Review of Systems  Unable to perform ROS: Dementia    Physical Exam Updated Vital Signs BP 117/69   Pulse 72   Temp 97.6 F (36.4 C) (Oral)   Resp 16   SpO2 99%   Physical Exam Constitutional:      General: He is not in acute distress.    Appearance: He is well-developed.  HENT:     Head: Normocephalic.     Nose: Nose normal.  Eyes:     Extraocular Movements: Extraocular movements intact.  Cardiovascular:     Rate and Rhythm: Normal rate.  Pulmonary:     Effort: Pulmonary effort is normal.  Skin:    Coloration: Skin is not jaundiced.  Neurological:     Mental Status: He is alert. Mental status is at baseline.     Comments: Patient is awake and alert.  Following all commands.  Able to lift his arms and legs on command.  He is oriented to place and time, but does not know why he is in the hospital.  When asked what year it is he states [birth year].     ED Results / Procedures / Treatments   Labs (all labs ordered are listed, but only abnormal results are displayed) Labs Reviewed  CBC WITH DIFFERENTIAL/PLATELET - Abnormal; Notable for the following components:      Result  Value   Hemoglobin 12.6 (*)    Lymphs Abs 0.4 (*)    All other components within normal limits  BASIC METABOLIC PANEL - Abnormal; Notable for the following components:   Glucose, Bld 201 (*)    All other components within normal limits    EKG None  Radiology CT Head Wo Contrast  Result Date: 05/01/2021 CLINICAL DATA:  Altered mental status. EXAM: CT HEAD WITHOUT CONTRAST TECHNIQUE: Contiguous axial images were obtained from the base of the skull through the vertex without intravenous contrast. COMPARISON:  04/09/2021 FINDINGS: Brain: Stable moderately enlarged ventricles and subarachnoid spaces. Stable mild patchy white matter low density in both cerebral hemispheres. Stable right frontotemporal convexity dural ossification or plaque-like meningioma. No intracranial hemorrhage, mass lesion or CT evidence of acute infarction. Vascular: No hyperdense vessel or unexpected calcification. Skull: Normal. Negative for fracture or focal lesion. Sinuses/Orbits: Status post bilateral cataract extraction. Stable post antrectomy changes on the right. Interval mild to moderate left maxillary sinus mucosal thickening with stable retention cysts. Stable mild right maxillary sinus mucosal thickening. Progressive bilateral ethmoid sinus mucosal thickening. Other: None. IMPRESSION: 1. No acute abnormality. 2. Stable atrophy and chronic small vessel white matter ischemic changes. 3. Stable frontotemporal dural ossification or plaque-like meningioma. Electronically Signed   By: Beckie Salts M.D.   On: 05/01/2021 14:32    Procedures Procedures   Medications Ordered in ED Medications - No data to display  ED Course  I have reviewed the triage vital signs and the nursing notes.  Pertinent labs & imaging results that were available during my care of the patient were reviewed by me and considered in my medical decision making (see chart for details).    MDM Rules/Calculators/A&P                           Patient appears comfortable in no acute distress.  Initial vital signs within normal limits blood pressure normal heart rate normal.  Labs are unremarkable.  Chemistry CBC and white count normal.  CT head performed showing no acute pathology per radiologist.  Patient remains without any additional adverse events awake and alert and interactive.  I spoke with the patient's wife as well who states that he just became unresponsive for a few minutes and then was back to his baseline self before EMS arrived.  Patient continues to be asymptomatic and improved at this time. Will be discharged back to his home, advised follow-up with his doctor within the week, advised immediate return for worsening symptoms or any additional concerns.  Final Clinical Impression(s) / ED Diagnoses Final diagnoses:  Altered mental status, unspecified altered mental status type    Rx / DC Orders ED Discharge Orders    None       Cheryll Cockayne, MD 05/01/21 1441    Cheryll Cockayne, MD 05/01/21 [id number]

## 2021-05-01 NOTE — ED Triage Notes (Signed)
EMS reports from home, home nurse tech called for AMS, released from [facility] yesterday from new onset seizures. Tech states Pt altered and not responding to her questions. Hx of dementia.  BP 109/80 HR 75 RR 18 Sp02 98 RA CBG 125 Temp 97.2

## 2021-05-04 ENCOUNTER — Emergency Department (HOSPITAL_COMMUNITY): Payer: Medicare Other

## 2021-05-04 ENCOUNTER — Emergency Department (HOSPITAL_COMMUNITY)
Admission: EM | Admit: 2021-05-04 | Discharge: 2021-05-04 | Disposition: A | Discharge status: Home / Self Care | Payer: Medicare Other | Attending: Emergency Medicine | Admitting: Emergency Medicine

## 2021-05-04 ENCOUNTER — Other Ambulatory Visit: Payer: Self-pay

## 2021-05-04 ENCOUNTER — Encounter (HOSPITAL_COMMUNITY): Payer: Self-pay

## 2021-05-04 DIAGNOSIS — Y92009 Unspecified place in unspecified non-institutional (private) residence as the place of occurrence of the external cause: Secondary | ICD-10-CM | POA: Diagnosis not present

## 2021-05-04 DIAGNOSIS — S99911A Unspecified injury of right ankle, initial encounter: Secondary | ICD-10-CM | POA: Diagnosis present

## 2021-05-04 DIAGNOSIS — W19XXXA Unspecified fall, initial encounter: Secondary | ICD-10-CM | POA: Diagnosis not present

## 2021-05-04 DIAGNOSIS — Z87891 Personal history of nicotine dependence: Secondary | ICD-10-CM | POA: Insufficient documentation

## 2021-05-04 DIAGNOSIS — Z79899 Other long term (current) drug therapy: Secondary | ICD-10-CM | POA: Insufficient documentation

## 2021-05-04 DIAGNOSIS — I10 Essential (primary) hypertension: Secondary | ICD-10-CM | POA: Diagnosis not present

## 2021-05-04 DIAGNOSIS — F0390 Unspecified dementia without behavioral disturbance: Secondary | ICD-10-CM | POA: Insufficient documentation

## 2021-05-04 DIAGNOSIS — S93401A Sprain of unspecified ligament of right ankle, initial encounter: Secondary | ICD-10-CM | POA: Diagnosis not present

## 2021-05-04 LAB — URINALYSIS, ROUTINE W REFLEX MICROSCOPIC
Bilirubin Urine: NEGATIVE
Glucose, UA: NEGATIVE mg/dL
Hgb urine dipstick: NEGATIVE
Ketones, ur: 5 mg/dL — AB
Leukocytes,Ua: NEGATIVE
Nitrite: NEGATIVE
Protein, ur: NEGATIVE mg/dL
Specific Gravity, Urine: 1.013 (ref 1.005–1.030)
pH: 8 (ref 5.0–8.0)

## 2021-05-04 LAB — COMPREHENSIVE METABOLIC PANEL
ALT: 39 U/L (ref 0–44)
AST: 33 U/L (ref 15–41)
Albumin: 3.4 g/dL — ABNORMAL LOW (ref 3.5–5.0)
Alkaline Phosphatase: 51 U/L (ref 38–126)
Anion gap: 10 (ref 5–15)
BUN: 15 mg/dL (ref 8–23)
CO2: 25 mmol/L (ref 22–32)
Calcium: 9.1 mg/dL (ref 8.9–10.3)
Chloride: 106 mmol/L (ref 98–111)
Creatinine, Ser: 0.75 mg/dL (ref 0.61–1.24)
GFR, Estimated: >60 mL/min (ref >60)
Glucose, Bld: 133 mg/dL — ABNORMAL HIGH (ref 70–99)
Potassium: 4.1 mmol/L (ref 3.5–5.1)
Sodium: 141 mmol/L (ref 135–145)
Total Bilirubin: 0.3 mg/dL (ref 0.3–1.2)
Total Protein: 5.9 g/dL — ABNORMAL LOW (ref 6.5–8.1)

## 2021-05-04 LAB — CBC WITH DIFFERENTIAL/PLATELET
Abs Immature Granulocytes: 0.03 10*3/uL (ref 0.00–0.07)
Basophils Absolute: 0 10*3/uL (ref 0.0–0.1)
Basophils Relative: 0 %
Eosinophils Absolute: 0 10*3/uL (ref 0.0–0.5)
Eosinophils Relative: 1 %
HCT: 40.4 % (ref 39.0–52.0)
Hemoglobin: 12.5 g/dL — ABNORMAL LOW (ref 13.0–17.0)
Immature Granulocytes: 1 %
Lymphocytes Relative: 5 %
Lymphs Abs: 0.3 10*3/uL — ABNORMAL LOW (ref 0.7–4.0)
MCH: 26.4 pg (ref 26.0–34.0)
MCHC: 30.9 g/dL (ref 30.0–36.0)
MCV: 85.4 fL (ref 80.0–100.0)
Monocytes Absolute: 1.1 10*3/uL — ABNORMAL HIGH (ref 0.1–1.0)
Monocytes Relative: 18 %
Neutro Abs: 4.5 10*3/uL (ref 1.7–7.7)
Neutrophils Relative %: 75 %
Platelets: 229 10*3/uL (ref 150–400)
RBC: 4.73 MIL/uL (ref 4.22–5.81)
RDW: 13.7 % (ref 11.5–15.5)
WBC: 5.9 10*3/uL (ref 4.0–10.5)
nRBC: 0 % (ref 0.0–0.2)

## 2021-05-04 LAB — TROPONIN I (HIGH SENSITIVITY)
Troponin I (High Sensitivity): 15 ng/L (ref <18)
Troponin I (High Sensitivity): 17 ng/L (ref <18)

## 2021-05-04 MED ORDER — SODIUM CHLORIDE 0.9 % IV BOLUS
1000.0000 mL | Freq: Once | INTRAVENOUS | Status: AC
  Administered 2021-05-04: 1000 mL via INTRAVENOUS

## 2021-05-04 NOTE — ED Provider Notes (Signed)
[facility]-EMERGENCY DEPT Provider Note   CSN: [id number] Arrival date & time: 05/04/21  4503     History Chief Complaint  Patient presents with  . Fall  . Failure To Thrive    [name] [name] is a 86 y.o. male.  Patient with multiple falls.  He has dementia.  Patient seems to have pain in his right ankle.  The history is provided by the patient and the EMS personnel. No language interpreter was used.  Fall This is a new problem. The current episode started 6 to 12 hours ago. The problem occurs constantly. The problem has not changed since onset.Pertinent negatives include no chest pain, no abdominal pain and no headaches. Nothing aggravates the symptoms. Nothing relieves the symptoms. He has tried nothing for the symptoms.       Past Medical History:  Diagnosis Date  . Allergy   . Dementia [facility])    sees Dr. Patrcia Dolly   . Diverticulosis of colon (without mention of hemorrhage) 03-01-2004, 04-04-2011   Colonoscopy  . ED (erectile dysfunction)   . Glaucoma    sees Dr. Arvil Chaco   . Hyperglycemia   . Hyperlipidemia   . Hypertension   . Internal hemorrhoids 03-15-1999   Flex Sig   . Irritable bowel     Patient Active Problem List   Diagnosis Date Noted  . Seizure (HCC) 03/09/2021  . Dementia (HCC) 02/25/2021  . BPH with urinary obstruction 12/22/2020  . Ankle edema, bilateral 07/13/2020  . Hallucinations 02/09/2020  . Abdominal pain, acute 08/21/2014  . GI bleeding 05/26/2012  . Renal mass 05/26/2012  . Diverticulosis of colon with hemorrhage 05/26/2012  . SHOULDER PAIN, BILATERAL 07/30/2010  . HERPES SIMPLEX INFECTION 10/10/2007  . TACHYCARDIA, PAROXYSMAL NOS 10/10/2007  . Dyslipidemia 07/11/2007  . Essential hypertension 07/11/2007  . ALLERGIC RHINITIS 07/11/2007    Past Surgical History:  Procedure Laterality Date  . COLONOSCOPY  04-04-11   per Dr. Jarold Motto, clear, no repeats needed  . EYE SURGERY     bilateral cataract  extraction per Dr Jettie Pagan  . INGUINAL HERNIA REPAIR    . KNEE ARTHROSCOPY     right knee  . NASAL SINUS SURGERY         Family History  Problem Relation Age of Onset  . Hypertension Mother   . Lung cancer Father   . Colitis Daughter     Social History   Tobacco Use  . Smoking status: Former Smoker    Quit date: 03/20/1969    Years since quitting: 52.1  . Smokeless tobacco: Never Used  Vaping Use  . Vaping Use: Never used  Substance Use Topics  . Alcohol use: Yes    Alcohol/week: 0.0 standard drinks    Comment: occ  . Drug use: No    Home Medications Prior to Admission medications   Medication Sig Start Date End Date Taking? Authorizing Provider  AZOPT 1 % ophthalmic suspension Place 1 drop into both eyes 3 (three) times daily. 12/18/19  Yes [provider]  bimatoprost (LUMIGAN) 0.01 % SOLN Place 1 drop into both eyes at bedtime.   Yes [provider]  cetirizine (ZYRTEC) 10 MG tablet Take 10 mg by mouth daily as needed for allergies.   Yes [provider]  Coenzyme Q10 (CO Q 10 PO) Take 1 capsule by mouth 2 (two) times daily.   Yes [provider]  diltiazem (DILTIAZEM CD) 120 MG 24 hr capsule Take 1 capsule (120 mg  total) by mouth daily. 04/07/21  Yes [name], MD  divalproex (DEPAKOTE ER) 500 MG 24 hr tablet Take 1 tablet every night Patient taking differently: Take 1,000 mg by mouth at bedtime. 03/29/21  Yes Van Clines, MD  donepezil (ARICEPT) 10 MG tablet Take 20 mg by mouth at bedtime. 03/06/21  Yes [provider]  fish oil-omega-3 fatty acids 1000 MG capsule Take 1 g by mouth daily.   Yes [provider]  Glucosamine-Chondroitin 500-400 MG CAPS Take 1 tablet by mouth daily.   Yes [provider]  levETIRAcetam (KEPPRA) 500 MG tablet TAKE 1 TABLET (500 MG TOTAL) BY MOUTH TWO TIMES DAILY. 03/11/21 03/11/22 Yes [name], MD  melatonin 5 MG TABS Take 5 mg by mouth at bedtime.   Yes [provider]  memantine (NAMENDA) 10 MG tablet Take 1 tablet twice a day Patient taking differently: Take 10 mg by mouth 2 (two) times daily. Take 1 tablet twice a day 03/22/21  Yes Van Clines, MD  MILK THISTLE PO Take 1 capsule by mouth daily.   Yes [provider]  Multiple Vitamins-Minerals (MULTIVITAMIN WITH MINERALS) tablet Take 1 tablet by mouth daily.   Yes [provider]  RHOPRESSA 0.02 % SOLN Place 1 drop into both eyes at bedtime. 08/17/19  Yes [provider]  risperiDONE (RISPERDAL) 0.5 MG tablet Take 1 tablet (0.5 mg total) by mouth at bedtime. 02/25/20  Yes [name], MD  tamsulosin (FLOMAX) 0.4 MG CAPS capsule Take 1 capsule (0.4 mg total) by mouth daily. 02/25/21  Yes [name], MD  terazosin (HYTRIN) 2 MG capsule Take 2 capsules (4 mg total) by mouth at bedtime. 02/25/21  Yes [name], MD    Allergies    Chocolate, Ciprofloxacin, and Lipitor [atorvastatin]  Review of Systems   Review of Systems  Constitutional: Negative for appetite change and fatigue.  HENT: Negative for congestion, ear discharge and sinus pressure.   Eyes: Negative for discharge.  Respiratory: Negative for cough.   Cardiovascular: Negative for chest pain.  Gastrointestinal: Negative for abdominal pain and diarrhea.  Genitourinary: Negative for frequency and hematuria.  Musculoskeletal: Negative for back pain.       Right ankle pain  Skin: Negative for rash.  Neurological: Negative for seizures and headaches.  Psychiatric/Behavioral: Negative for hallucinations.    Physical Exam Updated Vital Signs BP 140/89   Pulse 67   Temp 97.6 F (36.4 C) (Oral)   Resp 18   Ht 6\' 1"  (1.854 m)   Wt 77 kg   SpO2 99%   BMI 22.40 kg/m   Physical Exam Vitals reviewed.  Constitutional:      Appearance: He is well-developed.  HENT:     Head: Normocephalic.     Nose: Nose normal.  Eyes:     General: No scleral icterus.    Conjunctiva/sclera: Conjunctivae  normal.  Neck:     Thyroid: No thyromegaly.  Cardiovascular:     Rate and Rhythm: Normal rate and regular rhythm.     Heart sounds: No murmur heard. No friction rub. No gallop.   Pulmonary:     Breath sounds: No stridor. No wheezing or rales.  Chest:     Chest wall: No tenderness.  Abdominal:     General: There is no distension.     Tenderness: There is no abdominal tenderness. There is no rebound.  Musculoskeletal:        General: Normal range  of motion.     Cervical back: Neck supple.     Comments: Tender right ankle  Lymphadenopathy:     Cervical: No cervical adenopathy.  Skin:    Findings: No erythema or rash.  Neurological:     Mental Status: He is alert.     Motor: No abnormal muscle tone.     Coordination: Coordination normal.     Comments: Patient alert and oriented to person only  Psychiatric:        Behavior: Behavior normal.     ED Results / Procedures / Treatments   Labs (all labs ordered are listed, but only abnormal results are displayed) Labs Reviewed  CBC WITH DIFFERENTIAL/PLATELET - Abnormal; Notable for the following components:      Result Value   Hemoglobin 12.5 (*)    Lymphs Abs 0.3 (*)    Monocytes Absolute 1.1 (*)    All other components within normal limits  COMPREHENSIVE METABOLIC PANEL - Abnormal; Notable for the following components:   Glucose, Bld 133 (*)    Total Protein 5.9 (*)    Albumin 3.4 (*)    All other components within normal limits  URINALYSIS, ROUTINE W REFLEX MICROSCOPIC - Abnormal; Notable for the following components:   APPearance CLOUDY (*)    Ketones, ur 5 (*)    All other components within normal limits  TROPONIN I (HIGH SENSITIVITY)  TROPONIN I (HIGH SENSITIVITY)    EKG None  Radiology No results found.  Procedures Procedures   Medications Ordered in ED Medications  sodium chloride 0.9 % bolus 1,000 mL (1,000 mLs Intravenous New Bag/Given 05/04/21 1015)    ED Course  I have reviewed the triage vital  signs and the nursing notes.  Pertinent labs & imaging results that were available during my care of the patient were reviewed by me and considered in my medical decision making (see chart for details).   Labs and x-rays unremarkable.  Patient with mild sprain to right ankle.  He is unsteady on his feet.  I spoke to his wife and she wants to take care of him at home.  We are going to get the transition of care team to see if we can get her some help at home MDM Rules/Calculators/A&P                         Patient with dementia and multiple falls.  He has a right sprained ankle and will be seen by condition of care team to help with care at home  Final Clinical Impression(s) / ED Diagnoses Final diagnoses:  None    Rx / DC Orders ED Discharge Orders    None       Bethann Berkshire, MD 05/07/21 1717

## 2021-05-04 NOTE — ED Notes (Signed)
Patient was given a ham sandwich and water.

## 2021-05-04 NOTE — Progress Notes (Deleted)
TOC CSW received PASSAR# of  [id number].  [name] [name], MSW, LCSW-A Pronouns:  She, Her, Hers                  [facility] ED Transitions of CareClinical Social Worker [email or web address] [phone] 

## 2021-05-04 NOTE — Progress Notes (Signed)
.   Transition of Care [facility]) - Emergency Department Mini Assessment   Patient Details  Name: [name] [name] MRN: [id number] Date of Birth: 09-13-1935  Transition of Care [facility]) CM/SW Contact:    Elliot Cousin, RN Phone Number: [phone] 05/04/2021, 4:12 PM   Clinical Narrative: TOC CM spoke to wife and states he is active with [facility] Pt's wife states pt will benefit from having a hospital bed and bedside commode. Will send referral to [facility] for delivery to home. Contacted [facility] rep with resumption of care. Contacted [facility] for assistance with Memory Care. Contacted [facility] with information on private duty aide. Ed provider and ED RN updated.    ED Mini Assessment: What brought you to the Emergency Department? : falls  Barriers to Discharge: No Barriers Identified  Barrier interventions: contacted [facility], arranged for hospital bed and bedside commode  Means of departure: Ambulance  Interventions which prevented an admission or readmission: Home Health Consult or Services,DME Provided    Patient Contact and Communications Key Contact 1: Etheleen Nicks   Spoke with: wife Contact Date: 05/04/21,   Contact time: 1607 Contact Phone Number: [phone] Call outcome: come back home with [facility]  Patient states their goals for this hospitalization and ongoing recovery are:: get stronger at home CMS Medicare.gov Compare Post Acute Care list provided to:: Patient Represenative (must comment) Choice offered to / list presented to : Spouse  Admission diagnosis:  failure to thrive;falls Patient Active Problem List   Diagnosis Date Noted  . Seizure (HCC) 03/09/2021  . Dementia (HCC) 02/25/2021  . BPH with urinary obstruction 12/22/2020  . Ankle edema, bilateral 07/13/2020  . Hallucinations 02/09/2020  . Abdominal pain, acute 08/21/2014  . GI bleeding 05/26/2012  . Renal mass 05/26/2012  . Diverticulosis of colon  with hemorrhage 05/26/2012  . SHOULDER PAIN, BILATERAL 07/30/2010  . HERPES SIMPLEX INFECTION 10/10/2007  . TACHYCARDIA, PAROXYSMAL NOS 10/10/2007  . Dyslipidemia 07/11/2007  . Essential hypertension 07/11/2007  . ALLERGIC RHINITIS 07/11/2007   PCP:  [name], MD Pharmacy:   [facility] [address] [place] - [address] [place] [address] Phone: [phone] Fax: [phone]  [facility] [place], [place] - [address] AT Portal to Registered [employer] Sites [address] [place] [address] Phone: [phone] Fax: [phone]  [facility] [address] [place] [address] Phone: [phone] Fax: [phone]

## 2021-05-04 NOTE — Discharge Instructions (Signed)
Return for any problem.  ?

## 2021-05-04 NOTE — ED Triage Notes (Addendum)
As per medic pts wife called for frequent falls at home (3 in last week) all unwitnessed unknown if pt hit head. Ccollar placed by medic. As per medic pt has been declining, not walking at home, and unable to care for himself. Pt c/o R hip pain. Pt states his legs just give out and he falls.

## 2021-05-04 NOTE — ED Notes (Signed)
Sat up to eat dinner. Pt A&O to self and place. Calm and cooperative.

## 2021-05-04 NOTE — ED Notes (Signed)
Attempted to ambulate patient with MD Zammit , pt able to take a few small steps with 2 assist.

## 2021-05-04 NOTE — ED Notes (Signed)
[facility] called for transport.  

## 2021-05-04 NOTE — ED Notes (Signed)
Repeatedly trying to get out of bed. Reoriented to room. Bed alarm on.

## 2021-05-05 ENCOUNTER — Telehealth (INDEPENDENT_AMBULATORY_CARE_PROVIDER_SITE_OTHER): Payer: Medicare Other | Admitting: Family Medicine

## 2021-05-05 ENCOUNTER — Encounter: Payer: Self-pay | Admitting: Family Medicine

## 2021-05-05 DIAGNOSIS — F0281 Dementia in other diseases classified elsewhere with behavioral disturbance: Secondary | ICD-10-CM | POA: Diagnosis not present

## 2021-05-05 DIAGNOSIS — R569 Unspecified convulsions: Secondary | ICD-10-CM

## 2021-05-05 DIAGNOSIS — G301 Alzheimer's disease with late onset: Secondary | ICD-10-CM | POA: Diagnosis not present

## 2021-05-05 DIAGNOSIS — R443 Hallucinations, unspecified: Secondary | ICD-10-CM | POA: Diagnosis not present

## 2021-05-05 NOTE — Progress Notes (Signed)
Subjective:    Patient ID: [name] [name], male    DOB: Mar 28, 1935, 86 y.o.   MRN: [id number]  HPI Virtual Visit via Telephone Note  I connected with the patient on 05/05/21 at  2:15 PM EDT by telephone and verified that I am speaking with the correct person using two identifiers.   I discussed the limitations, risks, security and privacy concerns of performing an evaluation and management service by telephone and the availability of in person appointments. I also discussed with the patient that there may be a patient responsible charge related to this service. The patient expressed understanding and agreed to proceed.  Location patient: home Location provider: work or home office Participants present for the call: patient, provider Patient did not have a visit in the prior 7 days to address this/these issue(s).   History of Present Illness: Here with the patient and his wife to discuss recent issues. His wife does most of the taking. [name] [name]'s dementia has been rapidly worsening, and he gets very confused and even hallucinates at times. He has also fallen numerous times in the past few weeks, often when he gets up in the middle of the night to walk around. He wears adult diapers, but he insists on getting up at night to go to the bathroom. He was seen in the ED on 04-09-21, on 05-01-21, and again on 05-04-21 for injuries related to falling and for seizures. He has had several witnessed seizures in the past month. His neurologist, Dr. Karel Jarvis, has started him on Depakote, and she has increased the dose of this several times. They have been getting home health visits from [facility], but his wife has not been pleased with the care he has been receiving. At the last ED visit, the doctor suggested they work with [facility], and they did a referral for Matej to get a hospital bed and a bedside commode. Kael's wife says she is at the end of her rope, and she does not think she can care for him at  home any more. She says she cannot afford a typical nursing home, but a Child psychotherapist at the hospital suggested she look into smaller adult group homes. This would not involved skilled nurses and thus would be far more affordable. She asks our advise.   Observations/Objective: Patient sounds cheerful and well on the phone. I do not appreciate any SOB. Speech and thought processing are grossly intact. Patient reported vitals:  Assessment and Plan: His dementia is rapidly worsening, he is incontinent, and he is a fall risk. I agreed that an adult group home would be an excellent option to pursue, and his wife will research these. We will make no changes in his medications at this point. We discussed these issues in detail over a 45 minute time period.  Gershon Crane, MD   Follow Up Instructions:     [phone] [phone] [phone] 21-30 I did not refer this patient for an OV in the next 24 hours for this/these issue(s).  I discussed the assessment and treatment plan with the patient. The patient was provided an opportunity to ask questions and all were answered. The patient agreed with the plan and demonstrated an understanding of the instructions.   The patient was advised to call back or seek an in-person evaluation if the symptoms worsen or if the condition fails to improve as anticipated.  I provided 45 minutes of non-face-to-face time during this encounter.   Gershon Crane,  MD    Review of Systems     Objective:   Physical Exam        Assessment & Plan:

## 2021-05-07 ENCOUNTER — Inpatient Hospital Stay (HOSPITAL_COMMUNITY)
Admission: EM | Admit: 2021-05-07 | Discharge: 2021-05-12 | DRG: 073 | Disposition: A | Discharge status: Home Health | Payer: Medicare Other | Attending: Internal Medicine | Admitting: Internal Medicine

## 2021-05-07 ENCOUNTER — Emergency Department (HOSPITAL_COMMUNITY): Payer: Medicare Other

## 2021-05-07 ENCOUNTER — Telehealth: Payer: Self-pay | Admitting: Family Medicine

## 2021-05-07 ENCOUNTER — Encounter (HOSPITAL_COMMUNITY): Payer: Self-pay

## 2021-05-07 DIAGNOSIS — N401 Enlarged prostate with lower urinary tract symptoms: Secondary | ICD-10-CM | POA: Diagnosis present

## 2021-05-07 DIAGNOSIS — F0390 Unspecified dementia without behavioral disturbance: Secondary | ICD-10-CM | POA: Diagnosis present

## 2021-05-07 DIAGNOSIS — R296 Repeated falls: Secondary | ICD-10-CM | POA: Diagnosis present

## 2021-05-07 DIAGNOSIS — R680 Hypothermia, not associated with low environmental temperature: Secondary | ICD-10-CM | POA: Diagnosis present

## 2021-05-07 DIAGNOSIS — Z91018 Allergy to other foods: Secondary | ICD-10-CM

## 2021-05-07 DIAGNOSIS — F0281 Dementia in other diseases classified elsewhere with behavioral disturbance: Secondary | ICD-10-CM

## 2021-05-07 DIAGNOSIS — T68XXXA Hypothermia, initial encounter: Secondary | ICD-10-CM

## 2021-05-07 DIAGNOSIS — E785 Hyperlipidemia, unspecified: Secondary | ICD-10-CM | POA: Diagnosis present

## 2021-05-07 DIAGNOSIS — Z87891 Personal history of nicotine dependence: Secondary | ICD-10-CM

## 2021-05-07 DIAGNOSIS — I951 Orthostatic hypotension: Secondary | ICD-10-CM | POA: Diagnosis present

## 2021-05-07 DIAGNOSIS — L89151 Pressure ulcer of sacral region, stage 1: Secondary | ICD-10-CM | POA: Diagnosis present

## 2021-05-07 DIAGNOSIS — G909 Disorder of the autonomic nervous system, unspecified: Secondary | ICD-10-CM | POA: Diagnosis not present

## 2021-05-07 DIAGNOSIS — Z801 Family history of malignant neoplasm of trachea, bronchus and lung: Secondary | ICD-10-CM

## 2021-05-07 DIAGNOSIS — Z20822 Contact with and (suspected) exposure to covid-19: Secondary | ICD-10-CM | POA: Diagnosis present

## 2021-05-07 DIAGNOSIS — G40909 Epilepsy, unspecified, not intractable, without status epilepticus: Secondary | ICD-10-CM | POA: Diagnosis present

## 2021-05-07 DIAGNOSIS — R569 Unspecified convulsions: Secondary | ICD-10-CM

## 2021-05-07 DIAGNOSIS — Z7189 Other specified counseling: Secondary | ICD-10-CM

## 2021-05-07 DIAGNOSIS — R531 Weakness: Secondary | ICD-10-CM

## 2021-05-07 DIAGNOSIS — I452 Bifascicular block: Secondary | ICD-10-CM | POA: Diagnosis present

## 2021-05-07 DIAGNOSIS — R627 Adult failure to thrive: Secondary | ICD-10-CM | POA: Diagnosis present

## 2021-05-07 DIAGNOSIS — I10 Essential (primary) hypertension: Secondary | ICD-10-CM | POA: Diagnosis present

## 2021-05-07 DIAGNOSIS — Z79899 Other long term (current) drug therapy: Secondary | ICD-10-CM

## 2021-05-07 DIAGNOSIS — Z8616 Personal history of COVID-19: Secondary | ICD-10-CM

## 2021-05-07 DIAGNOSIS — Z515 Encounter for palliative care: Secondary | ICD-10-CM

## 2021-05-07 DIAGNOSIS — I309 Acute pericarditis, unspecified: Secondary | ICD-10-CM

## 2021-05-07 DIAGNOSIS — R55 Syncope and collapse: Secondary | ICD-10-CM | POA: Diagnosis not present

## 2021-05-07 DIAGNOSIS — E869 Volume depletion, unspecified: Secondary | ICD-10-CM | POA: Diagnosis present

## 2021-05-07 DIAGNOSIS — E860 Dehydration: Secondary | ICD-10-CM | POA: Diagnosis present

## 2021-05-07 DIAGNOSIS — L8990 Pressure ulcer of unspecified site, unspecified stage: Secondary | ICD-10-CM | POA: Insufficient documentation

## 2021-05-07 DIAGNOSIS — G301 Alzheimer's disease with late onset: Secondary | ICD-10-CM | POA: Diagnosis not present

## 2021-05-07 DIAGNOSIS — R578 Other shock: Secondary | ICD-10-CM | POA: Diagnosis not present

## 2021-05-07 DIAGNOSIS — N138 Other obstructive and reflux uropathy: Secondary | ICD-10-CM | POA: Diagnosis present

## 2021-05-07 DIAGNOSIS — Z8249 Family history of ischemic heart disease and other diseases of the circulatory system: Secondary | ICD-10-CM

## 2021-05-07 DIAGNOSIS — U071 COVID-19: Secondary | ICD-10-CM

## 2021-05-07 DIAGNOSIS — Z881 Allergy status to other antibiotic agents status: Secondary | ICD-10-CM

## 2021-05-07 DIAGNOSIS — W19XXXA Unspecified fall, initial encounter: Secondary | ICD-10-CM

## 2021-05-07 DIAGNOSIS — Z888 Allergy status to other drugs, medicaments and biological substances status: Secondary | ICD-10-CM

## 2021-05-07 DIAGNOSIS — R319 Hematuria, unspecified: Secondary | ICD-10-CM

## 2021-05-07 DIAGNOSIS — I313 Pericardial effusion (noninflammatory): Secondary | ICD-10-CM | POA: Diagnosis present

## 2021-05-07 LAB — URINALYSIS, ROUTINE W REFLEX MICROSCOPIC
Bacteria, UA: NONE SEEN
Bilirubin Urine: NEGATIVE
Glucose, UA: NEGATIVE mg/dL
Ketones, ur: 5 mg/dL — AB
Leukocytes,Ua: NEGATIVE
Nitrite: NEGATIVE
Protein, ur: NEGATIVE mg/dL
RBC / HPF: >50 RBC/hpf — ABNORMAL HIGH (ref 0–5)
Specific Gravity, Urine: 1.023 (ref 1.005–1.030)
pH: 6 (ref 5.0–8.0)

## 2021-05-07 LAB — RESP PANEL BY RT-PCR (FLU A&B, COVID) ARPGX2
Influenza A by PCR: NEGATIVE
Influenza B by PCR: NEGATIVE
SARS Coronavirus 2 by RT PCR: POSITIVE — AB

## 2021-05-07 LAB — CBC WITH DIFFERENTIAL/PLATELET
Abs Immature Granulocytes: 0.02 10*3/uL (ref 0.00–0.07)
Basophils Absolute: 0 10*3/uL (ref 0.0–0.1)
Basophils Relative: 1 %
Eosinophils Absolute: 0.2 10*3/uL (ref 0.0–0.5)
Eosinophils Relative: 6 %
HCT: 39.1 % (ref 39.0–52.0)
Hemoglobin: 11.9 g/dL — ABNORMAL LOW (ref 13.0–17.0)
Immature Granulocytes: 1 %
Lymphocytes Relative: 7 %
Lymphs Abs: 0.3 10*3/uL — ABNORMAL LOW (ref 0.7–4.0)
MCH: 26 pg (ref 26.0–34.0)
MCHC: 30.4 g/dL (ref 30.0–36.0)
MCV: 85.4 fL (ref 80.0–100.0)
Monocytes Absolute: 0.8 10*3/uL (ref 0.1–1.0)
Monocytes Relative: 20 %
Neutro Abs: 2.6 10*3/uL (ref 1.7–7.7)
Neutrophils Relative %: 65 %
Platelets: 231 10*3/uL (ref 150–400)
RBC: 4.58 MIL/uL (ref 4.22–5.81)
RDW: 14 % (ref 11.5–15.5)
WBC: 3.9 10*3/uL — ABNORMAL LOW (ref 4.0–10.5)
nRBC: 0 % (ref 0.0–0.2)

## 2021-05-07 LAB — COMPREHENSIVE METABOLIC PANEL
ALT: 30 U/L (ref 0–44)
AST: 35 U/L (ref 15–41)
Albumin: 3 g/dL — ABNORMAL LOW (ref 3.5–5.0)
Alkaline Phosphatase: 49 U/L (ref 38–126)
Anion gap: 6 (ref 5–15)
BUN: 15 mg/dL (ref 8–23)
CO2: 29 mmol/L (ref 22–32)
Calcium: 8.7 mg/dL — ABNORMAL LOW (ref 8.9–10.3)
Chloride: 104 mmol/L (ref 98–111)
Creatinine, Ser: 0.92 mg/dL (ref 0.61–1.24)
GFR, Estimated: >60 mL/min (ref >60)
Glucose, Bld: 188 mg/dL — ABNORMAL HIGH (ref 70–99)
Potassium: 3.8 mmol/L (ref 3.5–5.1)
Sodium: 139 mmol/L (ref 135–145)
Total Bilirubin: 0.4 mg/dL (ref 0.3–1.2)
Total Protein: 5.5 g/dL — ABNORMAL LOW (ref 6.5–8.1)

## 2021-05-07 LAB — VALPROIC ACID LEVEL: Valproic Acid Lvl: 21 ug/mL — ABNORMAL LOW (ref 50.0–100.0)

## 2021-05-07 LAB — LACTIC ACID, PLASMA: Lactic Acid, Venous: 1.8 mmol/L (ref 0.5–1.9)

## 2021-05-07 LAB — TSH: TSH: 0.832 u[IU]/mL (ref 0.350–4.500)

## 2021-05-07 LAB — LIPASE, BLOOD: Lipase: 26 U/L (ref 11–51)

## 2021-05-07 MED ORDER — SODIUM CHLORIDE 0.9 % IV BOLUS
1000.0000 mL | Freq: Once | INTRAVENOUS | Status: AC
  Administered 2021-05-07: 1000 mL via INTRAVENOUS

## 2021-05-07 MED ORDER — SODIUM CHLORIDE 0.9 % IV SOLN: INTRAVENOUS | Status: DC

## 2021-05-07 MED ORDER — DONEPEZIL HCL 10 MG PO TABS
20.0000 mg | ORAL_TABLET | Freq: Every day | ORAL | Status: DC
  Administered 2021-05-07 – 2021-05-11 (×5): 20 mg via ORAL
  Filled 2021-05-07 (×5): qty 2

## 2021-05-07 MED ORDER — DIVALPROEX SODIUM ER 500 MG PO TB24
1000.0000 mg | ORAL_TABLET | Freq: Every day | ORAL | Status: DC
  Administered 2021-05-07 – 2021-05-11 (×5): 1000 mg via ORAL
  Filled 2021-05-07 (×3): qty 2
  Filled 2021-05-07: qty 4
  Filled 2021-05-07: qty 2

## 2021-05-07 MED ORDER — ACETAMINOPHEN 325 MG PO TABS
650.0000 mg | ORAL_TABLET | Freq: Four times a day (QID) | ORAL | Status: DC | PRN
  Administered 2021-05-11: 650 mg via ORAL
  Filled 2021-05-07: qty 2

## 2021-05-07 MED ORDER — POLYETHYLENE GLYCOL 3350 17 G PO PACK
17.0000 g | PACK | Freq: Every day | ORAL | Status: DC | PRN
  Administered 2021-05-08: 17 g via ORAL
  Filled 2021-05-07: qty 1

## 2021-05-07 MED ORDER — RISPERIDONE 0.5 MG PO TABS
0.5000 mg | ORAL_TABLET | Freq: Every day | ORAL | Status: DC
  Administered 2021-05-07 – 2021-05-11 (×5): 0.5 mg via ORAL
  Filled 2021-05-07: qty 2
  Filled 2021-05-07 (×4): qty 1

## 2021-05-07 MED ORDER — TAMSULOSIN HCL 0.4 MG PO CAPS
0.4000 mg | ORAL_CAPSULE | Freq: Every day | ORAL | Status: DC
  Administered 2021-05-08 – 2021-05-09 (×2): 0.4 mg via ORAL
  Filled 2021-05-07 (×2): qty 1

## 2021-05-07 MED ORDER — ENOXAPARIN SODIUM 40 MG/0.4ML IJ SOSY
40.0000 mg | PREFILLED_SYRINGE | INTRAMUSCULAR | Status: DC
  Administered 2021-05-07 – 2021-05-11 (×5): 40 mg via SUBCUTANEOUS
  Filled 2021-05-07 (×5): qty 0.4

## 2021-05-07 MED ORDER — MEMANTINE HCL 10 MG PO TABS
10.0000 mg | ORAL_TABLET | Freq: Two times a day (BID) | ORAL | Status: DC
  Administered 2021-05-08 – 2021-05-12 (×9): 10 mg via ORAL
  Filled 2021-05-07 (×3): qty 1
  Filled 2021-05-07: qty 2
  Filled 2021-05-07 (×2): qty 1
  Filled 2021-05-07: qty 2
  Filled 2021-05-07 (×2): qty 1

## 2021-05-07 MED ORDER — LEVETIRACETAM 500 MG PO TABS
500.0000 mg | ORAL_TABLET | Freq: Two times a day (BID) | ORAL | Status: DC
  Administered 2021-05-07 – 2021-05-09 (×5): 500 mg via ORAL
  Filled 2021-05-07 (×6): qty 1

## 2021-05-07 MED ORDER — ACETAMINOPHEN 650 MG RE SUPP: 650.0000 mg | Freq: Four times a day (QID) | RECTAL | Status: DC | PRN

## 2021-05-07 NOTE — ED Notes (Addendum)
20g IV restarted to the right forearm. NS bolus restarted at 182mL's/hr

## 2021-05-07 NOTE — ED Notes (Signed)
Pt opened the room door and was standing at the end of the bed naked. Pt ripped out IV, ripped off condom cath, and took himself off of the monitor. Pt redressed and helped back into the bed. Bed alarm has been turned on.

## 2021-05-07 NOTE — ED Notes (Signed)
Pt back from CT at this time 

## 2021-05-07 NOTE — ED Notes (Signed)
Patient calm, resting, asleep in bed now with bed alarm on and nonskid grip socks.

## 2021-05-07 NOTE — ED Triage Notes (Signed)
Pt presents to the ED via EMS from home and lives with his wife for an episode of "unresponsiveness." Per EMS the pt was sitting upright in a chair with eyes open but would not respond to his wife. Per EMS, pt was admitted to the hospital 10 days ago and was diagnosed with failure to thrive.  Pt has a productive cough and was initially hypotensive on scene.

## 2021-05-07 NOTE — ED Notes (Signed)
Patient able to swallow pills with water

## 2021-05-07 NOTE — ED Notes (Signed)
Pt with rectal temp of 96.18F. Bear hugger applied at this time and provider notified

## 2021-05-07 NOTE — ED Notes (Signed)
Pt to ct 

## 2021-05-07 NOTE — H&P (Signed)
History and Physical   PIUS BYROM EXB:[id number] DOB: 06-08-1935 DOA: 05/07/2021  PCP: [name], MD   Patient coming from: Home  Chief Complaint: Unresponsive episode  HPI: [name] [name] is a 86 y.o. male with medical history significant of BPH, dementia, diverticulosis, hyperlipidemia, hypertension, GI bleed, seizure, tachycardia who presents after episode of unresponsiveness.  Due to patient's dementia history obtained with assistance of family and chart review.  Patient has had poor health for the past several months.  He has a baseline of dementia.  He is evaluated in March for questionable seizure and started on Keppra.  He followed up outpatient and Depakote was added to his regimen.  He has been at a rehab facility since that admission in March and returned home about a week or so ago.  He has been seen for falls and similar unresponsive episodes at the end of April and early May.  He has had episodes of falling and these unresponsive episodes.  He had an episode of unresponsiveness today while sitting in a chair.  When EMS was called out , blood pressure initially noted to be on the low side and also hypothermic.  Has improved back to baseline in ED. he denies any symptoms in particular was not aware of the event that caused him to present (the unresponsive event).  ED Course: Vital signs in the ED significant for initial temperature in the 96-97 range otherwise stable.  Received 1 L in the ED.  Lab work-up showed CMP with creatinine stable, glucose 188, calcium 8.7, albumin 3.0, protein 5.5.  CBC with white count 3.9, hemoglobin 11.9.  Lactic acid normal, lipase normal respiratory panel positive for COVID.  Urinalysis with hemoglobin ketones, urine culture and blood culture pending.  Chest x-ray without acute abnormality.  Chest x-ray with stable atrophy and stable calcifications and no acute abnormalities.  Review of Systems: As per HPI otherwise all other systems reviewed  and are negative.  Past Medical History:  Diagnosis Date  . Allergy   . Dementia [facility])    sees Dr. Patrcia Dolly   . Diverticulosis of colon (without mention of hemorrhage) 03-01-2004, 04-04-2011   Colonoscopy  . ED (erectile dysfunction)   . Glaucoma    sees Dr. Arvil Chaco   . Hyperglycemia   . Hyperlipidemia   . Hypertension   . Internal hemorrhoids 03-15-1999   Flex Sig   . Irritable bowel     Past Surgical History:  Procedure Laterality Date  . COLONOSCOPY  04-04-11   per Dr. Jarold Motto, clear, no repeats needed  . EYE SURGERY     bilateral cataract extraction per Dr Jettie Pagan  . INGUINAL HERNIA REPAIR    . KNEE ARTHROSCOPY     right knee  . NASAL SINUS SURGERY      Social History  reports that he quit smoking about 52 years ago. He has never used smokeless tobacco. He reports current alcohol use. He reports that he does not use drugs.  Allergies  Allergen Reactions  . Chocolate     Itching if he eats too much chocolate   . Ciprofloxacin     Achilles tendon pain   . Lipitor [Atorvastatin]     Muscle aches     Family History  Problem Relation Age of Onset  . Hypertension Mother   . Lung cancer Father   . Colitis Daughter   Reviewed on admission  Prior to Admission medications   Medication Sig Start Date End Date Taking?  Authorizing Provider  AZOPT 1 % ophthalmic suspension Place 1 drop into both eyes 3 (three) times daily. 12/18/19   [provider]  bimatoprost (LUMIGAN) 0.01 % SOLN Place 1 drop into both eyes at bedtime.    [provider]  cetirizine (ZYRTEC) 10 MG tablet Take 10 mg by mouth daily as needed for allergies.    [provider]  Coenzyme Q10 (CO Q 10 PO) Take 1 capsule by mouth 2 (two) times daily.    [provider]  diltiazem (DILTIAZEM CD) 120 MG 24 hr capsule Take 1 capsule (120 mg total) by mouth daily. 04/07/21   [name], MD  divalproex (DEPAKOTE ER) 500 MG 24 hr tablet Take 1 tablet every  night Patient taking differently: Take 1,000 mg by mouth at bedtime. 03/29/21   Van Clines, MD  donepezil (ARICEPT) 10 MG tablet Take 20 mg by mouth at bedtime. 03/06/21   [provider]  fish oil-omega-3 fatty acids 1000 MG capsule Take 1 g by mouth daily.    [provider]  Glucosamine-Chondroitin 500-400 MG CAPS Take 1 tablet by mouth daily.    [provider]  levETIRAcetam (KEPPRA) 500 MG tablet TAKE 1 TABLET (500 MG TOTAL) BY MOUTH TWO TIMES DAILY. 03/11/21 03/11/22  [name], MD  melatonin 5 MG TABS Take 5 mg by mouth at bedtime.    [provider]  memantine (NAMENDA) 10 MG tablet Take 1 tablet twice a day Patient taking differently: Take 10 mg by mouth 2 (two) times daily. Take 1 tablet twice a day 03/22/21   Van Clines, MD  MILK THISTLE PO Take 1 capsule by mouth daily.    [provider]  Multiple Vitamins-Minerals (MULTIVITAMIN WITH MINERALS) tablet Take 1 tablet by mouth daily.    [provider]  RHOPRESSA 0.02 % SOLN Place 1 drop into both eyes at bedtime. 08/17/19   [provider]  risperiDONE (RISPERDAL) 0.5 MG tablet Take 1 tablet (0.5 mg total) by mouth at bedtime. 02/25/20   [name], MD  tamsulosin (FLOMAX) 0.4 MG CAPS capsule Take 1 capsule (0.4 mg total) by mouth daily. 02/25/21   [name], MD  terazosin (HYTRIN) 2 MG capsule Take 2 capsules (4 mg total) by mouth at bedtime. 02/25/21   [name], MD    Physical Exam: Vitals:   05/07/21 1702 05/07/21 1845 05/07/21 1915 05/07/21 2030  BP: (!) 147/78 137/82 (!) 153/82 (!) 167/88  Pulse: 60 70 73 72  Resp: 19 12 10 13   Temp: 97.6 F (36.4 C)     TempSrc: Rectal     SpO2: 100% 99% 100% 100%   Physical Exam Constitutional:      General: He is not in acute distress.    Appearance: Normal appearance.  HENT:     Head: Normocephalic and atraumatic.     Mouth/Throat:     Mouth: Mucous membranes are moist.     Pharynx: Oropharynx  is clear.  Eyes:     Extraocular Movements: Extraocular movements intact.     Pupils: Pupils are equal, round, and reactive to light.  Cardiovascular:     Rate and Rhythm: Normal rate and regular rhythm.     Pulses: Normal pulses.     Heart sounds: Normal heart sounds.  Pulmonary:     Effort: Pulmonary effort is normal. No respiratory distress.     Breath sounds: Normal breath sounds.  Abdominal:  General: Bowel sounds are normal. There is no distension.     Palpations: Abdomen is soft.     Tenderness: There is no abdominal tenderness.  Musculoskeletal:        General: No swelling or deformity.  Skin:    General: Skin is warm and dry.  Neurological:     General: No focal deficit present.     Mental Status: He is oriented to person, place, and time. Mental status is at baseline.    Labs on Admission: I have personally reviewed following labs and imaging studies  CBC: Recent Labs  Lab 05/01/21 1359 05/04/21 1000 05/07/21 1312  WBC 4.6 5.9 3.9*  NEUTROABS 3.4 4.5 2.6  HGB 12.6* 12.5* 11.9*  HCT 41.4 40.4 39.1  MCV 86.1 85.4 85.4  PLT 261 229 231    Basic Metabolic Panel: Recent Labs  Lab 05/01/21 1359 05/04/21 1000 05/07/21 1312  NA 140 141 139  K 3.8 4.1 3.8  CL 106 106 104  CO2 28 25 29   GLUCOSE 201* 133* 188*  BUN 14 15 15   CREATININE 1.07 0.75 0.92  CALCIUM 9.0 9.1 8.7*    GFR: Estimated Creatinine Clearance: 66.3 mL/min (by C-G formula based on SCr of 0.92 mg/dL).  Liver Function Tests: Recent Labs  Lab 05/04/21 1000 05/07/21 1312  AST 33 35  ALT 39 30  ALKPHOS 51 49  BILITOT 0.3 0.4  PROT 5.9* 5.5*  ALBUMIN 3.4* 3.0*    Urine analysis:    Component Value Date/Time   COLORURINE AMBER (A) 05/07/2021 1433   APPEARANCEUR CLEAR 05/07/2021 1433   LABSPEC 1.023 05/07/2021 1433   PHURINE 6.0 05/07/2021 1433   GLUCOSEU NEGATIVE 05/07/2021 1433   GLUCOSEU NEGATIVE 02/06/2020 1032   HGBUR SMALL (A) 05/07/2021 1433   HGBUR negative  09/16/2010 0839   BILIRUBINUR NEGATIVE 05/07/2021 1433   BILIRUBINUR neg 02/22/2019 0958   KETONESUR 5 (A) 05/07/2021 1433   PROTEINUR NEGATIVE 05/07/2021 1433   UROBILINOGEN 0.2 02/06/2020 1032   NITRITE NEGATIVE 05/07/2021 1433   LEUKOCYTESUR NEGATIVE 05/07/2021 1433   Radiological Exams on Admission: CT Head Wo Contrast  Result Date: 05/07/2021 CLINICAL DATA:  Mental status change.  COVID positive patient. EXAM: CT HEAD WITHOUT CONTRAST TECHNIQUE: Contiguous axial images were obtained from the base of the skull through the vertex without intravenous contrast. COMPARISON:  Multiple prior exams, most recent head CT 3 days ago 05/04/2021 FINDINGS: Brain: Stable atrophy and chronic small vessel ischemia. No hemorrhage. No evidence of acute ischemia. Stable ventricular size and configuration. Dural based plaque-like calcification over lying the right frontotemporal lobe, unchanged from prior exams. No mass effect or midline shift. Vascular: No hyperdense vessel. Skull: No fracture or focal lesion. Sinuses/Orbits: Again seen mucosal thickening of ethmoid air cells and left frontal sinus. Mucous retention cyst left maxillary sinus. Other: None. IMPRESSION: 1. No acute intracranial abnormality. 2. Stable atrophy and chronic small vessel ischemia. 3. 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 This is unchanged from multiple prior exams. Electronically Signed   By: 05/09/2021 M.D.   On: 05/07/2021 17:06   DG Chest Port 1 View  Result Date: 05/07/2021 CLINICAL DATA:  Altered mental status EXAM: PORTABLE CHEST 1 VIEW COMPARISON:  Chest x-rays dated 05/04/2021 and 04/09/2021. FINDINGS: Heart size and mediastinal contours are stable. Lungs are clear. No pleural effusion or pneumothorax is seen. Osseous structures about the chest are unremarkable. IMPRESSION: No active disease. No evidence of pneumonia or pulmonary  edema.  Electronically Signed   By: Bary RichardStan  Maynard M.D.   On: 05/07/2021 14:20   EKG: Independently reviewed.  Sinus rhythm at 59 bpm.  Right bundle blanch block.  Artifact in multiple leads.  Similar to previous.  Assessment/Plan Principal Problem:   Syncope Active Problems:   Dyslipidemia   Essential hypertension   BPH with urinary obstruction   Dementia (HCC)   Seizure (HCC)  Syncope Failure to thrive > Patient has had worsening health since March when he was seen for possible seizure and started on antiepileptics.  He was discharged to a rehab facility following this and has been home for about 1 week.  He has been having episodes of falling and episodes of unresponsiveness which could be consistent with syncopal like episode.  No significant tonic-clonic seizure-like activity or incontinence. > Suspicion for vasovagal or neurocardiogenic syncope considering blood pressure initially low for EMS. > As above likely component of failure to thrive given his age and poor health recently. > Unclear if positive COVID infection could be playing a role.  He has been positive since at least April at his facility.  Chest x-ray is clear.  Afebrile. > No suspicion for infectious etiology other than possibility of COVID contributing as above. > Unclear if any seizure activity or seizure medications could be playing a role as his symptoms not consistent with typical seizure-like activity (post atypical seizure).  Previous EEG was consistent with dementia. > Patient has returned to his baseline in ED per his wife, a&ox3 for me - We will continue with syncope work-up and monitor on telemetry - Echocardiogram - Check orthostatic vital signs - Follow-up antiepileptic levels checked in ED  - PT/OT consult - Check TSH for completeness - Trend labs  Dementia > Patient has chronic dementia and is currently back to his baseline per reports. - Continue home Namenda and donepezil  Covid positive > Has been  COVID-positive since April 18 at facility.  Remains within the 90-day window of positivity. - Is outside 21-day window for precautions.   BPH - Continue tamsulosin  Hypertension - Holding antihypertensives considering initial low blood pressures for EMS and possible syncopal episode as above  Seizures > As above recently evaluated for questionable seizure activity and started on antiepileptics > This episode is similar, but without shaking. ?Post ictal - Has been continued on antiepileptics and has had additional added, currently on Depakote and Keppra - Levels being checked in ED - Continue antiepileptics for now  Hyperlipidemia - Continue home statin  DVT prophylaxis: Lovenox  Code Status:   Full  Family Communication:  Wife updated by phone  Disposition Plan:   Patient is from:  Home  Anticipated DC to:  Pending clinical evaluation  Anticipated DC date:  1 to 3 days  Anticipated DC barriers: None  Consults called:  None  Admission status:  Observation, telemetry   Severity of Illness: The appropriate patient status for this patient is OBSERVATION. Observation status is judged to be reasonable and necessary in order to provide the required intensity of service to ensure the patient's safety. The patient's presenting symptoms, physical exam findings, and initial radiographic and laboratory data in the context of their medical condition is felt to place them at decreased risk for further clinical deterioration. Furthermore, it is anticipated that the patient will be medically stable for discharge from the hospital within 2 midnights of admission. The following factors support the patient status of observation.   " The patient's presenting symptoms include  episode of unresponsiveness, low blood pressure, hypothermia. " The physical exam findings include stable exam, chronic ill apearing. " The initial radiographic and laboratory data are CT head with stable atrophy and stable  calcification, glucose 188, hemoglobin 1.9, WBC 3.9.  Urinalysis with hemoglobin ketones.Synetta Fail MD [facility]  How to contact the [facility] Attending or Consulting provider 7A - 7P or covering provider during after hours 7P -7A, for this patient?   1. Check the care team in [facility] and look for a) attending/consulting [facility] provider listed and b) the [facility] team listed 2. Log into [email or web address] and use [place]'s universal password to access. If you do not have the password, please contact the hospital operator. 3. Locate the [facility] provider you are looking for under [facility] and page to a number that you can be directly reached. 4. If you still have difficulty reaching the provider, please page the [facility] (Director on Call) for the Hospitalists listed on amion for assistance.  05/07/2021, 8:43 PM

## 2021-05-07 NOTE — ED Provider Notes (Signed)
[facility]-EMERGENCY DEPT Provider Note   CSN: [id number] Arrival date & time: 05/07/21  1238     History Chief Complaint  Patient presents with  . Hypotension    [name] [name] is a 86 y.o. male.  Patient brought in by EMS.  Patient apparently was hypotensive at home.  He had a syncopal or a seizure-like activity at home.  Patient just got back home from [facility] about a week ago.  Patient was diagnosed with COVID around April 18.  Patient had an admission to the hospital on March 15 and at discharge went to the nursing facility.  Patient was started on Keppra and Depakote patient's wife believes he still on both.  He was sitting in a chair his eyes were open but he would not respond.  Wife states other times there is been sort of a shaking type of behavior.  Patient has had a productive cough.  And as stated patient was hypotensive at the scene.        Past Medical History:  Diagnosis Date  . Allergy   . Dementia [facility])    sees Dr. Patrcia Dolly   . Diverticulosis of colon (without mention of hemorrhage) 03-01-2004, 04-04-2011   Colonoscopy  . ED (erectile dysfunction)   . Glaucoma    sees Dr. Arvil Chaco   . Hyperglycemia   . Hyperlipidemia   . Hypertension   . Internal hemorrhoids 03-15-1999   Flex Sig   . Irritable bowel     Patient Active Problem List   Diagnosis Date Noted  . Seizure (HCC) 03/09/2021  . Dementia (HCC) 02/25/2021  . BPH with urinary obstruction 12/22/2020  . Ankle edema, bilateral 07/13/2020  . Hallucinations 02/09/2020  . Abdominal pain, acute 08/21/2014  . GI bleeding 05/26/2012  . Renal mass 05/26/2012  . Diverticulosis of colon with hemorrhage 05/26/2012  . SHOULDER PAIN, BILATERAL 07/30/2010  . HERPES SIMPLEX INFECTION 10/10/2007  . TACHYCARDIA, PAROXYSMAL NOS 10/10/2007  . Dyslipidemia 07/11/2007  . Essential hypertension 07/11/2007  . ALLERGIC RHINITIS 07/11/2007    Past Surgical History:   Procedure Laterality Date  . COLONOSCOPY  04-04-11   per Dr. Jarold Motto, clear, no repeats needed  . EYE SURGERY     bilateral cataract extraction per Dr Jettie Pagan  . INGUINAL HERNIA REPAIR    . KNEE ARTHROSCOPY     right knee  . NASAL SINUS SURGERY         Family History  Problem Relation Age of Onset  . Hypertension Mother   . Lung cancer Father   . Colitis Daughter     Social History   Tobacco Use  . Smoking status: Former Smoker    Quit date: 03/20/1969    Years since quitting: 52.1  . Smokeless tobacco: Never Used  Vaping Use  . Vaping Use: Never used  Substance Use Topics  . Alcohol use: Yes    Alcohol/week: 0.0 standard drinks    Comment: occ  . Drug use: No    Home Medications Prior to Admission medications   Medication Sig Start Date End Date Taking? Authorizing Provider  AZOPT 1 % ophthalmic suspension Place 1 drop into both eyes 3 (three) times daily. 12/18/19   [provider]  bimatoprost (LUMIGAN) 0.01 % SOLN Place 1 drop into both eyes at bedtime.    [provider]  cetirizine (ZYRTEC) 10 MG tablet Take 10 mg by mouth daily as needed for allergies.    [provider]  Coenzyme Q10 (CO Q 10 PO) Take 1 capsule by mouth 2 (two) times daily.    [provider]  diltiazem (DILTIAZEM CD) 120 MG 24 hr capsule Take 1 capsule (120 mg total) by mouth daily. 04/07/21   [name], MD  divalproex (DEPAKOTE ER) 500 MG 24 hr tablet Take 1 tablet every night Patient taking differently: Take 1,000 mg by mouth at bedtime. 03/29/21   Van Clines, MD  donepezil (ARICEPT) 10 MG tablet Take 20 mg by mouth at bedtime. 03/06/21   [provider]  fish oil-omega-3 fatty acids 1000 MG capsule Take 1 g by mouth daily.    [provider]  Glucosamine-Chondroitin 500-400 MG CAPS Take 1 tablet by mouth daily.    [provider]  levETIRAcetam (KEPPRA) 500 MG tablet TAKE 1 TABLET (500 MG TOTAL) BY MOUTH TWO TIMES DAILY.  03/11/21 03/11/22  [name], MD  melatonin 5 MG TABS Take 5 mg by mouth at bedtime.    [provider]  memantine (NAMENDA) 10 MG tablet Take 1 tablet twice a day Patient taking differently: Take 10 mg by mouth 2 (two) times daily. Take 1 tablet twice a day 03/22/21   Van Clines, MD  MILK THISTLE PO Take 1 capsule by mouth daily.    [provider]  Multiple Vitamins-Minerals (MULTIVITAMIN WITH MINERALS) tablet Take 1 tablet by mouth daily.    [provider]  RHOPRESSA 0.02 % SOLN Place 1 drop into both eyes at bedtime. 08/17/19   [provider]  risperiDONE (RISPERDAL) 0.5 MG tablet Take 1 tablet (0.5 mg total) by mouth at bedtime. 02/25/20   [name], MD  tamsulosin (FLOMAX) 0.4 MG CAPS capsule Take 1 capsule (0.4 mg total) by mouth daily. 02/25/21   [name], MD  terazosin (HYTRIN) 2 MG capsule Take 2 capsules (4 mg total) by mouth at bedtime. 02/25/21   [name], MD    Allergies    Chocolate, Ciprofloxacin, and Lipitor [atorvastatin]  Review of Systems   Review of Systems  Unable to perform ROS: Dementia    Physical Exam Updated Vital Signs BP (!) 147/78 (BP Location: Right Arm)   Pulse 60   Temp 97.6 F (36.4 C) (Rectal)   Resp 19   SpO2 100%   Physical Exam Vitals and nursing note reviewed.  Constitutional:      General: He is not in acute distress.    Appearance: Normal appearance. He is well-developed.  HENT:     Head: Normocephalic and atraumatic.     Mouth/Throat:     Mouth: Mucous membranes are dry.  Eyes:     Conjunctiva/sclera: Conjunctivae normal.     Pupils: Pupils are equal, round, and reactive to light.  Cardiovascular:     Rate and Rhythm: Normal rate and regular rhythm.     Heart sounds: No murmur heard.   Pulmonary:     Effort: Pulmonary effort is normal. No respiratory distress.     Breath sounds: Normal breath sounds. No wheezing.  Abdominal:     Palpations: Abdomen is soft.      Tenderness: There is no abdominal tenderness.  Musculoskeletal:        General: No swelling. Normal range of motion.     Cervical back: Normal range of motion and neck supple.  Skin:    General: Skin is warm and dry.  Neurological:     Mental Status: He is alert.  Comments: Difficult to get patient to follow commands.  Moving all 4 extremities.  Currently wanting to get out of the bed.  Patient does have a degree of confusion.  When asked any questions he says just talk to my wife.     ED Results / Procedures / Treatments   Labs (all labs ordered are listed, but only abnormal results are displayed) Labs Reviewed  RESP PANEL BY RT-PCR (FLU A&B, COVID) ARPGX2 - Abnormal; Notable for the following components:      Result Value   SARS Coronavirus 2 by RT PCR POSITIVE (*)    All other components within normal limits  CBC WITH DIFFERENTIAL/PLATELET - Abnormal; Notable for the following components:   WBC 3.9 (*)    Hemoglobin 11.9 (*)    Lymphs Abs 0.3 (*)    All other components within normal limits  URINALYSIS, ROUTINE W REFLEX MICROSCOPIC - Abnormal; Notable for the following components:   Color, Urine AMBER (*)    Hgb urine dipstick SMALL (*)    Ketones, ur 5 (*)    RBC / HPF >50 (*)    All other components within normal limits  COMPREHENSIVE METABOLIC PANEL - Abnormal; Notable for the following components:   Glucose, Bld 188 (*)    Calcium 8.7 (*)    Total Protein 5.5 (*)    Albumin 3.0 (*)    All other components within normal limits  CULTURE, BLOOD (ROUTINE X 2)  CULTURE, BLOOD (ROUTINE X 2)  URINE CULTURE  LACTIC ACID, PLASMA  LIPASE, BLOOD    EKG EKG Interpretation  Date/Time:  Friday May 07 2021 12:52:44 EDT Ventricular Rate:  59 PR Interval:  43 QRS Duration: 167 QT Interval:  484 QTC Calculation: 480 R Axis:   -49 Text Interpretation: Sinus rhythm Short PR interval RBBB and LAFB Artifact in lead(s) I II aVR V1 V2 Interpretation limited secondary to  artifact Otherwise no significant change Confirmed by Vanetta Mulders [phone]) on 05/07/2021 1:44:59 PM   Radiology CT Head Wo Contrast  Result Date: 05/07/2021 CLINICAL DATA:  Mental status change.  COVID positive patient. EXAM: CT HEAD WITHOUT CONTRAST TECHNIQUE: Contiguous axial images were obtained from the base of the skull through the vertex without intravenous contrast. COMPARISON:  Multiple prior exams, most recent head CT 3 days ago 05/04/2021 FINDINGS: Brain: Stable atrophy and chronic small vessel ischemia. No hemorrhage. No evidence of acute ischemia. Stable ventricular size and configuration. Dural based plaque-like calcification over lying the right frontotemporal lobe, unchanged from prior exams. No mass effect or midline shift. Vascular: No hyperdense vessel. Skull: No fracture or focal lesion. Sinuses/Orbits: Again seen mucosal thickening of ethmoid air cells and left frontal sinus. Mucous retention cyst left maxillary sinus. Other: None. IMPRESSION: 1. No acute intracranial abnormality. 2. Stable atrophy and chronic small vessel ischemia. 3. 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 This is unchanged from multiple prior exams. Electronically Signed   By: Narda Rutherford M.D.   On: 05/07/2021 17:06   DG Chest Port 1 View  Result Date: 05/07/2021 CLINICAL DATA:  Altered mental status EXAM: PORTABLE CHEST 1 VIEW COMPARISON:  Chest x-rays dated 05/04/2021 and 04/09/2021. FINDINGS: Heart size and mediastinal contours are stable. Lungs are clear. No pleural effusion or pneumothorax is seen. Osseous structures about the chest are unremarkable. IMPRESSION: No active disease. No evidence of pneumonia or pulmonary edema. Electronically Signed   By: Bary Richard M.D.   On: 05/07/2021 14:20  Procedures Procedures   CRITICAL CARE Performed by: Vanetta MuldersScott Isiaah Cuervo Total critical care time: 45 minutes Critical care time  was exclusive of separately billable procedures and treating other patients. Critical care was necessary to treat or prevent imminent or life-threatening deterioration. Critical care was time spent personally by me on the following activities: development of treatment plan with patient and/or surrogate as well as nursing, discussions with consultants, evaluation of patient's response to treatment, examination of patient, obtaining history from patient or surrogate, ordering and performing treatments and interventions, ordering and review of laboratory studies, ordering and review of radiographic studies, pulse oximetry and re-evaluation of patient's condition.   Medications Ordered in ED Medications  0.9 %  sodium chloride infusion ( Intravenous New Bag/Given 05/07/21 1344)  sodium chloride 0.9 % bolus 1,000 mL (0 mLs Intravenous Stopped 05/07/21 1635)    ED Course  I have reviewed the triage vital signs and the nursing notes.  Pertinent labs & imaging results that were available during my care of the patient were reviewed by me and considered in my medical decision making (see chart for details).    MDM Rules/Calculators/A&P                         Patient was hypotensive at home.  Had a syncopal or seizure type event.  Patient's blood pressure was not hypotensive upon arrival here.  Patient was hypothermic.  Was put on [employer] hugger.  Temperature is coming up.  As temperature came up patient's activity improved.  No evidence of any acute infection based on chest x-ray.  CT head without any acute changes.  Lactic acid not elevated.  No leukocytosis.  Other than the hypothermia.  And the history of the hypotension at home to warrant starting broad-spectrum antibiotics or 30 cc/kg fluid bolus.   I did have a long conversation with his wife the patient was admitted March 15.  Discharged on the 17th or 18th [facility]  Patient contacted COVID there to around April 18.   Patient just came home a week ago.  Patient was also evaluated May 7 for acute mental status change and May 10 for failure to thrive.  He was sent home on both occasions.  Patient's wife states that is been several syncopal episodes.  There is some shaking associated with this.  Back in the March admission he was started on Keppra.  He has followed up with neurology outpatient and is now on Depakote.  Patient believes he is on both.  An EEG was done without any distinct epileptic findings.  More consistent with dementia.  It is not clear whether patient has been on cardiac monitoring with any of these events have occurred certainly today he had a syncopal episode he was hypotensive.  And arrived here hypothermic  Discussed with hospitalist they will admit.  After also long discussion with patient's wife it may be better if patient discharged back to nursing facility.  There have been falls that have been occurring as well.  No evidence of any lower extremity injury.  Head CT was negative.  Final Clinical Impression(s) / ED Diagnoses Final diagnoses:  COVID  Dementia without behavioral disturbance, unspecified dementia type (HCC)  Hypothermia, initial encounter    Rx / DC Orders ED Discharge Orders    None       Vanetta MuldersZackowski, Treyvone Chelf, MD 05/07/21 2020

## 2021-05-07 NOTE — Telephone Encounter (Signed)
Pt wife call and stated he is back at the ER because of a seizure and she think  the medication is to strong .

## 2021-05-07 NOTE — Telephone Encounter (Signed)
Noted  

## 2021-05-07 NOTE — ED Notes (Signed)
Messaged pharmacy to verify night medications.

## 2021-05-08 ENCOUNTER — Observation Stay (HOSPITAL_COMMUNITY): Payer: Medicare Other

## 2021-05-08 ENCOUNTER — Observation Stay (HOSPITAL_BASED_OUTPATIENT_CLINIC_OR_DEPARTMENT_OTHER): Payer: Medicare Other

## 2021-05-08 ENCOUNTER — Other Ambulatory Visit: Payer: Self-pay

## 2021-05-08 DIAGNOSIS — I351 Nonrheumatic aortic (valve) insufficiency: Secondary | ICD-10-CM

## 2021-05-08 DIAGNOSIS — R55 Syncope and collapse: Secondary | ICD-10-CM

## 2021-05-08 DIAGNOSIS — U071 COVID-19: Secondary | ICD-10-CM

## 2021-05-08 DIAGNOSIS — R627 Adult failure to thrive: Secondary | ICD-10-CM | POA: Diagnosis not present

## 2021-05-08 DIAGNOSIS — R609 Edema, unspecified: Secondary | ICD-10-CM | POA: Diagnosis not present

## 2021-05-08 DIAGNOSIS — F0150 Vascular dementia without behavioral disturbance: Secondary | ICD-10-CM

## 2021-05-08 LAB — COMPREHENSIVE METABOLIC PANEL
ALT: 29 U/L (ref 0–44)
AST: 34 U/L (ref 15–41)
Albumin: 3.1 g/dL — ABNORMAL LOW (ref 3.5–5.0)
Alkaline Phosphatase: 54 U/L (ref 38–126)
Anion gap: 5 (ref 5–15)
BUN: 9 mg/dL (ref 8–23)
CO2: 30 mmol/L (ref 22–32)
Calcium: 8.9 mg/dL (ref 8.9–10.3)
Chloride: 107 mmol/L (ref 98–111)
Creatinine, Ser: 0.65 mg/dL (ref 0.61–1.24)
GFR, Estimated: >60 mL/min (ref >60)
Glucose, Bld: 116 mg/dL — ABNORMAL HIGH (ref 70–99)
Potassium: 3.7 mmol/L (ref 3.5–5.1)
Sodium: 142 mmol/L (ref 135–145)
Total Bilirubin: 0.4 mg/dL (ref 0.3–1.2)
Total Protein: 5.7 g/dL — ABNORMAL LOW (ref 6.5–8.1)

## 2021-05-08 LAB — CBC
HCT: 41.4 % (ref 39.0–52.0)
Hemoglobin: 12.7 g/dL — ABNORMAL LOW (ref 13.0–17.0)
MCH: 26.2 pg (ref 26.0–34.0)
MCHC: 30.7 g/dL (ref 30.0–36.0)
MCV: 85.5 fL (ref 80.0–100.0)
Platelets: 242 10*3/uL (ref 150–400)
RBC: 4.84 MIL/uL (ref 4.22–5.81)
RDW: 13.8 % (ref 11.5–15.5)
WBC: 4.2 10*3/uL (ref 4.0–10.5)
nRBC: 0 % (ref 0.0–0.2)

## 2021-05-08 LAB — ECHOCARDIOGRAM COMPLETE
Area-P 1/2: 2.75 cm2
P 1/2 time: 620 msec
S' Lateral: 2.4 cm
Weight: 2518.4 oz

## 2021-05-08 LAB — D-DIMER, QUANTITATIVE: D-Dimer, Quant: 1.01 ug/mL-FEU — ABNORMAL HIGH (ref 0.00–0.50)

## 2021-05-08 MED ORDER — SODIUM CHLORIDE 0.9 % IV SOLN: INTRAVENOUS | Status: DC

## 2021-05-08 NOTE — Plan of Care (Signed)

## 2021-05-08 NOTE — ED Notes (Signed)
Changed patients brief 

## 2021-05-08 NOTE — Evaluation (Addendum)
Occupational Therapy Evaluation and Discharge Patient Details Name: [name] [name] MRN: [id number] DOB: 1935-09-20 Today's Date: 05/08/2021    History of Present Illness 86 y/o male presented to ED after a syncopal or a seizure-like activity at home.  Patient just got back home from [facility] about a week ago.  Patient was diagnosed with COVID around April 18.  Patient had an admission to the hospital on March 15 and at discharge went to the nursing facility.   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 Wife has only been min guard A (due to she cannot physically help him) while he does stand pivot/squat pivot transfers from bed to chair next to bed and if he falls she calls 911. She prefers him not to try and walk due to numerous falls at home since getting out of rehab. She is looking into trying to get additional help but it is expensive per her report since he does not have Medicaid. She is not interested him him going back to a SNF.No further skilled OT needs since he is mainly dependent for basic ADLs and is unable to carry over any lessons taught. We will sign off.    Follow Up Recommendations  Home health OT;Supervision/Assistance - 24 hour    Equipment Recommendations  Wheelchair (measurements OT);Wheelchair cushion (measurements OT);3 in 1 bedside commode       Precautions / Restrictions Precautions Precautions: Fall Precaution Comments: incontient during PT session but OT session Restrictions Weight Bearing Restrictions: No      Mobility Bed Mobility Overal bed mobility: Needs Assistance Bed Mobility: Supine to Sit;Sit to Supine     Supine to sit: Mod assist Sit to supine: Mod assist   General bed  mobility comments: assist for trunk upright, assist for LEs onto bed    Transfers Overall transfer level: Needs assistance Equipment used: Rolling walker (2 wheeled) Transfers: Sit to/from [employer] Sit to Stand: Mod assist Stand pivot transfers: Mod assist       General transfer comment: multimodal cues for safe technique, pt attempting to move RW away at times, increased bil knee flexion in standing, assist for stability    Balance Overall balance assessment: Needs assistance Sitting-balance support: No upper extremity supported;Feet supported Sitting balance-Leahy Scale: Fair     Standing balance support: Bilateral upper extremity supported;During functional activity Standing balance-Leahy Scale: Poor Standing balance comment: reliant on UE support on RW or other surfaces and additional A from therapist                           ADL either performed or assessed with clinical judgement   ADL Overall ADL's : Needs assistance/impaired   Eating/Feeding Details (indicate cue type and reason): total A   Grooming Details (indicate cue type and reason): min guard A to wash face while seated EOB Upper Body Bathing: Total assistance;Sitting   Lower Body Bathing: Total assistance Lower Body Bathing Details (indicate cue type and reason): Mod A sit<>stand Upper Body Dressing : Total assistance;Sitting   Lower Body Dressing: Total assistance Lower Body Dressing Details (indicate cue type and reason): Mod A sit<>stand Toilet Transfer: Moderate assistance;Stand-pivot;BSC;RW Statistician Details (  indicate cue type and reason): and without RW, pt let go of RW and reached for far arm of BSC to stand pivot (this is what he does at home per wife for transfers Toileting- Architect and Hygiene: Total assistance Toileting - Clothing Manipulation Details (indicate cue type and reason): Mod A sit<>stand             Vision   Additional  Comments: unsure what his vision is, but definitely "seeing" things that are not there, when he says he is looking at me it appears he is looking through me.            Pertinent Vitals/Pain Pain Assessment: No/denies pain     Hand Dominance Right   Extremity/Trunk Assessment Upper Extremity Assessment Upper Extremity Assessment: Generalized weakness     Communication Communication Communication: No difficulties   Cognition Arousal/Alertness: Awake/alert Behavior During Therapy: Impulsive Overall Cognitive Status: History of cognitive impairments - at baseline                                 General Comments: hx dementia, pt pleasantly confused, required repeated cues but pt following simple commands, pointing to things that aren't there and asking what they are. Decreased awareness of deficts and safety              Home Living Family/patient expects to be discharged to:: Private residence Living Arrangements: Spouse/significant other Available Help at Discharge: Family;Available 24 hours/day Type of Home: House Home Access: Level entry     Home Layout: One level     Bathroom Shower/Tub: Chief Strategy Officer: Handicapped height     Home Equipment: Environmental consultant - 2 wheels   Additional Comments: above per admission 4 weeks ago, per chart, pt just back home after d/c from SNF      Prior Functioning/Environment Level of Independence: Needs assistance  Gait / Transfers Assistance Needed: Spoke with wife in person: she reports he was using RW at SNF but she has not been using it with him because she cannot manage him with it due to she has "a bad heart, a hurt right shoulder, bad left knee, and ambulate with this cane"). She prefers him only to do bed to chair beside bed transfers because when he gets up he falls (she reports having to call 911 as much as 2 times a day to get him up from falling) ADL's / Homemaking Assistance Needed: Wife reports  she sponge baths him and dresses him. He wears Depends at home due to incontinence            OT Problem List: Impaired balance (sitting and/or standing);Decreased cognition;Decreased coordination;Decreased safety awareness;Decreased knowledge of use of DME or AE      OT Treatment/Interventions:  (none)    OT Goals(Current goals can be found in the care plan section) Acute Rehab OT Goals Patient Stated Goal: to go to the bathroom OT Goal Formulation: Patient unable to participate in goal setting Time For Goal Achievement: 05/22/21 Potential to Achieve Goals: Fair  OT Frequency:  (none)              AM-PAC OT "6 Clicks" Daily Activity     Outcome Measure Help from another person eating meals?: Total Help from another person taking care of personal grooming?: A Lot Help from another person toileting, which includes using toliet, bedpan, or urinal?: Total Help from another person bathing (  including washing, rinsing, drying)?: Total Help from another person to put on and taking off regular upper body clothing?: Total Help from another person to put on and taking off regular lower body clothing?: Total 6 Click Score: 2   End of Session Equipment Utilized During Treatment: Gait belt;Rolling walker  Activity Tolerance: Patient tolerated treatment well Patient left: in bed;with call bell/phone within reach;with bed alarm set  OT Visit Diagnosis: Unsteadiness on feet (R26.81);Other abnormalities of gait and mobility (R26.89);Other symptoms and signs involving cognitive function                Time: 3524-8185 OT Time Calculation (min): 34 min Charges:  OT General Charges $OT Visit: 1 Visit OT Evaluation $OT Eval Moderate Complexity: 1 Mod OT Treatments $Self Care/Home Management : 8-22 mins Ignacia Palma, OTR/L Acute [employer] Pager [phone] Office [phone]     Evette Georges 05/08/2021, 4:55 PM

## 2021-05-08 NOTE — Progress Notes (Signed)
VQ scan has been cancelled, as per Dr Carmon Ginsberg. Xu.

## 2021-05-08 NOTE — Evaluation (Addendum)
Physical Therapy Evaluation Patient Details Name: [name] [name] MRN: [id number] DOB: August 12, 1935 Today's Date: 05/08/2021   History of Present Illness  86 y/o male presented to ED after a syncopal or a seizure-like activity at home.  Patient just got back home from [facility] about a week ago.  Patient was diagnosed with COVID around April 18.  Patient had an admission to the hospital on March 15 and at discharge went to the nursing facility.   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  Pt currently with functional limitations due to the deficits listed below (see PT Problem List). Pt will benefit from skilled PT to increase their independence and safety with mobility to allow discharge to the venue listed below.  Pt recently back home after rehab at SNF per chart review.  Pt with hx of dementia and has been receiving rehab (HHPT and SNF recently).  Pt would benefit from 24/7 assist for safety due to frequent fall history per notes.  Uncertain how much carryover pt has for physical therapy.  He was not able to reorient to hospital and continued to talk as though he was at home during session.     Follow Up Recommendations Supervision/Assistance - 24 hour;Home health PT    Equipment Recommendations  None recommended by PT    Recommendations for Other Services       Precautions / Restrictions Precautions Precautions: Fall Precaution Comments: incontinent (asking for briefs from family during session      Mobility  Bed Mobility Overal bed mobility: Needs Assistance Bed Mobility: Supine to Sit;Sit to Supine     Supine to sit: Min assist Sit to supine: Mod assist   General bed mobility comments: assist for trunk upright, assist for LEs onto bed    Transfers Overall transfer level: Needs assistance Equipment used: Rolling walker (2 wheeled) Transfers: Sit  to/from [employer] Sit to Stand: Min assist Stand pivot transfers: Min assist       General transfer comment: multimodal cues for safe technique, pt attempting to move RW away at times, increased bil knee flexion in standing, assist for stability and manual cues as pt quick to move at times Transferred to/from [facility], required assist for pericare  Ambulation/Gait             General Gait Details: deferred for safety  Stairs            Wheelchair Mobility    Modified Rankin (Stroke Patients Only)       Balance Overall balance assessment: Needs assistance;History of Falls         Standing balance support: Bilateral upper extremity supported Standing balance-Leahy Scale: Poor Standing balance comment: reliant on UE support                             Pertinent Vitals/Pain Pain Assessment: No/denies pain    Home Living Family/patient expects to be discharged to:: Private residence Living Arrangements: Spouse/significant other Available Help at Discharge: Family;Available 24 hours/day Type of Home: House Home Access: Level entry     Home Layout: One level Home Equipment: Walker - 2 wheels Additional Comments: above per admission 4 weeks ago, per chart, pt just back home after d/c from SNF    Prior Function Level of Independence: Needs assistance   Gait / Transfers Assistance Needed: likely using RW  ADL's / Homemaking Assistance Needed: likely total care due to cognition        Hand Dominance        Extremity/Trunk Assessment        Lower Extremity Assessment Lower Extremity Assessment: RLE deficits/detail;LLE deficits/detail;Generalized weakness RLE Deficits / Details: bil knee flexion in stance    Cervical / Trunk Assessment Cervical / Trunk Assessment: Kyphotic  Communication   Communication: No difficulties  Cognition Arousal/Alertness: Awake/alert Behavior During Therapy: Impulsive Overall Cognitive  Status: No family/caregiver present to determine baseline cognitive functioning                                 General Comments: hx dementia, pt pleasantly confused, required repeated cues but pt following simple commands, pt talking as if at home ("put that on the dresser")      General Comments      Exercises     Assessment/Plan    PT Assessment Patient needs continued PT services  PT Problem List Decreased strength;Decreased balance;Decreased knowledge of use of DME;Decreased mobility;Decreased activity tolerance;Decreased safety awareness;Decreased cognition       PT Treatment Interventions DME instruction;Therapeutic exercise;Gait training;Functional mobility training;Therapeutic activities;Patient/family education    PT Goals (Current goals can be found in the Care Plan section)  Acute Rehab PT Goals PT Goal Formulation: Patient unable to participate in goal setting Time For Goal Achievement: 05/22/21 Potential to Achieve Goals: Fair    Frequency Min 2X/week   Barriers to discharge        Co-evaluation               AM-PAC PT "6 Clicks" Mobility  Outcome Measure Help needed turning from your back to your side while in a flat bed without using bedrails?: A Lot Help needed moving from lying on your back to sitting on the side of a flat bed without using bedrails?: A Lot Help needed moving to and from a bed to a chair (including a wheelchair)?: A Lot Help needed standing up from a chair using your arms (e.g., wheelchair or bedside chair)?: A Lot Help needed to walk in hospital room?: A Lot Help needed climbing 3-5 steps with a railing? : Total 6 Click Score: 11    End of Session Equipment Utilized During Treatment: Gait belt Activity Tolerance: Patient tolerated treatment well Patient left: in bed;with call bell/phone within reach;with bed alarm set   PT Visit Diagnosis: Difficulty in walking, not elsewhere classified (R26.2);Adult, failure to  thrive (R62.7)    Time: 1425-1446 PT Time Calculation (min) (ACUTE ONLY): 21 min   Charges:   PT Evaluation $PT Eval Moderate Complexity: 1 Mod         Kati PT, DPT Acute Rehabilitation Services Pager: [phone] Office: [phone]  Maida Sale E 05/08/2021, 2:55 PM

## 2021-05-08 NOTE — ED Notes (Signed)
Patients sheets and brief changed.

## 2021-05-08 NOTE — Progress Notes (Signed)
  Echocardiogram 2D Echocardiogram has been performed.  [name] [name] 05/08/2021, 11:48 AM

## 2021-05-08 NOTE — Progress Notes (Signed)
Patient more confused during the of end of the shift. Able to follow commands but would not let ball of stool in hand go, endorsed, he would " knock the staffs head off we took it." Passed on to night shift about keeping stool in hand. Secure chat sent to MD Roda Shutters. Follow up phone call came through, and explained to MD that VQ scan was planned for 7:30 and patient not be able to lay still for test. Patient also refused last set of admission vitals signs. No other needs idenitrified.

## 2021-05-08 NOTE — Progress Notes (Signed)
PROGRESS NOTE    MAEL DELAP  NWG:[id number] DOB: 07/11/1935 DOA: 05/07/2021 PCP: [name], MD    Chief Complaint  Patient presents with  . Hypotension    Brief Narrative:  [name] Elena McLaughlinis a 86 y.o.malewith medical history significant ofadvanced dementia, glaucoma, BPH, diverticulosis, gi bleed, HTN, was hospitalized in 02/2021 due to unresponsiveness, neurology was consulted, he was started on Keppra for possible seizure, He followed up outpatient and Depakote 500 mg daily was added .He has been at a rehab facility since that admission in March and returned home about a week or so ago.  He has been seen for falls and similar unresponsive episodes at the end of April and early May.   He had an episode of unresponsiveness today while sitting in a chair.  When EMS was called out , blood pressure initially noted to be on the low side and also hypothermic.             Has improved back to baseline in ED. he denies any symptoms in particular was not aware of the event that caused him to present (the unresponsive event).  Subjective:  He is demented, but no agitation at this  He appear very weak, but does not in acute distress He is on room air  Assessment & Plan:   Principal Problem:   Syncope Active Problems:   Dyslipidemia   Essential hypertension   BPH with urinary obstruction   Dementia (HCC)   Seizure (HCC)  Unresponsiveness episode/falls since March 2020 -Unclear etiology -CT head no acute findings -D-dimer mildly elevated at 1 ,venous doppler negative for DVT, not able to do CTA due due to shortage of IV contrast, patient cannot tolerate VQ scan due to confusion, since echocardiogram showed pericardial effusion may is complaining his symptom, will hold off PE work-up for now -echo showed circumferential pericardial effusion, he does has orthostatic hypotension, there is no tachycardia -continue ivf, message sent to cardiology requesting  consult  Seizure? EEG not seizure activity during last hospitalization He was started on keppra since last hospitalization, will continue for now Keppra level in process While p.o. acid level 21, 4 weeks ago was 53   HTN; hold cardizem due to orthostatic hypotension  BPH; continue flomax  Dementia  Patient has chronic dementia and is currently back to his baseline per reports. - Continue home Namenda and donepezil   Covid positive - Has been COVID-positive since April 18 at facility.  Remains within the 90-day window of positivity. - Is outside 21-day window for precautions.   FTT: appear very weak to me , will get PT eval      Body mass index is 20.77 kg/m.Marland Kitchen      Skin Assessment: I have examined the patient's skin and I agree with the wound assessment as performed by the wound care RN as outlined below:  Pressure Injury 05/08/21 Coccyx Mid Stage 1 -  Intact skin with non-blanchable redness of a localized area usually over a bony prominence. (Active)  05/08/21 0515  Location: Coccyx  Location Orientation: Mid  Staging: Stage 1 -  Intact skin with non-blanchable redness of a localized area usually over a bony prominence.  Wound Description (Comments):   Present on Admission: Yes    Unresulted Labs (From admission, onward)          Start     Ordered   05/14/21 0500  Creatinine, serum  (enoxaparin (LOVENOX)    CrCl >/= 30 ml/min)  Weekly,  R     Comments: while on enoxaparin therapy    05/07/21 1920   05/09/21 0500  C-reactive protein  Tomorrow morning,   R        05/08/21 1935   05/09/21 0500  D-dimer, quantitative  Tomorrow morning,   R        05/08/21 1935   05/09/21 0500  CBC with Differential/Platelet  Tomorrow morning,   R        05/08/21 1935   05/09/21 0500  Basic metabolic panel  Tomorrow morning,   R        05/08/21 1935   05/09/21 0500  Magnesium  Tomorrow morning,   R        05/08/21 1935   05/09/21 0500  Brain natriuretic peptide  Tomorrow  morning,   R        05/08/21 1935   05/07/21 1915  Levetiracetam level  Once,   STAT        05/07/21 1914   05/07/21 1331  Urine Culture  ONCE - STAT,   STAT        05/07/21 1333            DVT prophylaxis: enoxaparin (LOVENOX) injection 40 mg Start: 05/07/21 2200   Code Status: Full Family Communication: None at bedside Disposition:   Status is: Observation  Dispo: The patient is from: Home              Anticipated d/c is to: Likely will need skilled nursing facility              Anticipated d/c date is: To be determined             Consultants:   Cardiology  Procedures:   None  Antimicrobials:   None     Objective: Vitals:   05/08/21 0500 05/08/21 0513 05/08/21 0940 05/08/21 1258  BP:  (!) 162/82 126/70 (!) 143/81  Pulse:  70 60 63  Resp:   15 15  Temp:  (!) 97.5 F (36.4 C) (!) 97.3 F (36.3 C) 97.6 F (36.4 C)  TempSrc:  Oral Oral Oral  SpO2:  100% 99% 100%  Weight: 71.4 kg       Intake/Output Summary (Last 24 hours) at 05/08/2021 1951 Last data filed at 05/08/2021 1916 Gross per 24 hour  Intake 3482.51 ml  Output 2400 ml  Net 1082.51 ml   Filed Weights   05/08/21 0500  Weight: 71.4 kg    Examination:  General exam: Very frail, demented elderly Respiratory system: Clear to auscultation. Respiratory effort normal. Cardiovascular system: Distant heart sound, no edema Gastrointestinal system: Abdomen is nondistended, soft and nontender. Normal bowel sounds heard. Central nervous system: Alert and oriented to self Extremities: Generalized weakness Skin: No rashes, lesions or ulcers Psychiatry: Demented, no agitation    Data Reviewed: I have personally reviewed following labs and imaging studies  CBC: Recent Labs  Lab 05/04/21 1000 05/07/21 1312 05/08/21 0350  WBC 5.9 3.9* 4.2  NEUTROABS 4.5 2.6  --   HGB 12.5* 11.9* 12.7*  HCT 40.4 39.1 41.4  MCV 85.4 85.4 85.5  PLT 229 231 242    Basic Metabolic Panel: Recent Labs  Lab  05/04/21 1000 05/07/21 1312 05/08/21 0350  NA 141 139 142  K 4.1 3.8 3.7  CL 106 104 107  CO2 25 29 30   GLUCOSE 133* 188* 116*  BUN 15 15 9   CREATININE 0.75 0.92 0.65  CALCIUM 9.1 8.7* 8.9  GFR: Estimated Creatinine Clearance: 70.7 mL/min (by C-G formula based on SCr of 0.65 mg/dL).  Liver Function Tests: Recent Labs  Lab 05/04/21 1000 05/07/21 1312 05/08/21 0350  AST 33 35 34  ALT 39 30 29  ALKPHOS 51 49 54  BILITOT 0.3 0.4 0.4  PROT 5.9* 5.5* 5.7*  ALBUMIN 3.4* 3.0* 3.1*    CBG: No results for input(s): GLUCAP in the last 168 hours.   Recent Results (from the past 240 hour(s))  Culture, blood (Routine X 2) w Reflex to ID Panel     Status: None (Preliminary result)   Collection Time: 05/07/21  1:13 PM   Specimen: BLOOD  Result Value Ref Range Status   Specimen Description   Final    BLOOD RIGHT ANTECUBITAL Performed at [facility], [address], [place], [place] [address]    Special Requests   Final    BOTTLES DRAWN AEROBIC AND ANAEROBIC Blood Culture results may not be optimal due to an inadequate volume of blood received in culture bottles Performed at [facility], [address] [place], [place] [address]    Culture   Final    NO GROWTH 1 DAY Performed at [facility], [address] [place], [place] [address]    Report Status PENDING  Incomplete  Culture, blood (Routine X 2) w Reflex to ID Panel     Status: None (Preliminary result)   Collection Time: 05/07/21  1:14 PM   Specimen: BLOOD  Result Value Ref Range Status   Specimen Description   Final    BLOOD LEFT ANTECUBITAL Performed at [facility], [address], [place], [place] [address]    Special Requests   Final    BOTTLES DRAWN AEROBIC AND ANAEROBIC Blood Culture results may not be optimal due to an inadequate volume of blood received in culture bottles Performed at [facility], [address]  [place], [place] [address]    Culture   Final    NO GROWTH 1 DAY Performed at [facility], [address], [place], [place] [address]    Report Status PENDING  Incomplete  Resp Panel by RT-PCR (Flu A&B, Covid) Nasopharyngeal Swab     Status: Abnormal   Collection Time: 05/07/21  1:37 PM   Specimen: Nasopharyngeal Swab; Nasopharyngeal(NP) swabs in vial transport medium  Result Value Ref Range Status   SARS Coronavirus 2 by RT PCR POSITIVE (A) NEGATIVE Final    Comment: (NOTE) SARS-CoV-2 target nucleic acids are DETECTED.  The SARS-CoV-2 RNA is generally detectable in upper respiratory specimens during the acute phase of infection. Positive results are indicative of the presence of the identified virus, but do not rule out bacterial infection or co-infection with other pathogens not detected by the test. Clinical correlation with patient history and other diagnostic information is necessary to determine patient infection status. The expected result is Negative.  Fact Sheet for Patients: BloggerCourse.com  Fact Sheet for Healthcare Providers: SeriousBroker.it  This test is not yet approved or cleared by the [place] FDA and  has been authorized for detection and/or diagnosis of SARS-CoV-2 by FDA under an Emergency Use Authorization (EUA).  This EUA will remain in effect (meaning this test can be used) for the duration of  the COVID-19 declaration under Section 564(b)(1) of the A ct, 21 U.S.C. section 360bbb-3(b)(1), unless the authorization is terminated or revoked sooner.     Influenza A by PCR NEGATIVE NEGATIVE Final  Influenza B by PCR NEGATIVE NEGATIVE Final    Comment: (NOTE) The Xpert Xpress SARS-CoV-2/FLU/RSV plus assay is intended as an aid in the diagnosis of influenza from Nasopharyngeal swab specimens and should not be used as a sole basis for treatment. Nasal washings and aspirates are unacceptable for Xpert  Xpress SARS-CoV-2/FLU/RSV testing.  Fact Sheet for Patients: BloggerCourse.com  Fact Sheet for Healthcare Providers: SeriousBroker.it  This test is not yet approved or cleared by the [place] FDA and has been authorized for detection and/or diagnosis of SARS-CoV-2 by FDA under an Emergency Use Authorization (EUA). This EUA will remain in effect (meaning this test can be used) for the duration of the COVID-19 declaration under Section 564(b)(1) of the Act, 21 U.S.C. section 360bbb-3(b)(1), unless the authorization is terminated or revoked.  Performed at [facility], [address] [place], [place] [address]          Radiology Studies: CT Head Wo Contrast  Result Date: 05/07/2021 CLINICAL DATA:  Mental status change.  COVID positive patient. EXAM: CT HEAD WITHOUT CONTRAST TECHNIQUE: Contiguous axial images were obtained from the base of the skull through the vertex without intravenous contrast. COMPARISON:  Multiple prior exams, most recent head CT 3 days ago 05/04/2021 FINDINGS: Brain: Stable atrophy and chronic small vessel ischemia. No hemorrhage. No evidence of acute ischemia. Stable ventricular size and configuration. Dural based plaque-like calcification over lying the right frontotemporal lobe, unchanged from prior exams. No mass effect or midline shift. Vascular: No hyperdense vessel. Skull: No fracture or focal lesion. Sinuses/Orbits: Again seen mucosal thickening of ethmoid air cells and left frontal sinus. Mucous retention cyst left maxillary sinus. Other: None. IMPRESSION: 1. No acute intracranial abnormality. 2. Stable atrophy and chronic small vessel ischemia. 3. 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 This is unchanged from multiple prior exams. Electronically Signed   By: Narda Rutherford M.D.   On: 05/07/2021 17:06   DG  Chest Port 1 View  Result Date: 05/07/2021 CLINICAL DATA:  Altered mental status EXAM: PORTABLE CHEST 1 VIEW COMPARISON:  Chest x-rays dated 05/04/2021 and 04/09/2021. FINDINGS: Heart size and mediastinal contours are stable. Lungs are clear. No pleural effusion or pneumothorax is seen. Osseous structures about the chest are unremarkable. IMPRESSION: No active disease. No evidence of pneumonia or pulmonary edema. Electronically Signed   By: Bary Richard M.D.   On: 05/07/2021 14:20   ECHOCARDIOGRAM COMPLETE  Result Date: 05/08/2021    ECHOCARDIOGRAM REPORT   Patient Name:   ATWELL MCDANEL Date of Exam: 05/08/2021 Medical Rec #:  [id number]        Height:       73.0 in Accession #:    [id number]       Weight:       157.4 lb Date of Birth:  1935-08-23        BSA:          1.943 m Patient Age:    85 years         BP:           162/82 mmHg Patient Gender: M                HR:           55 bpm. Exam Location:  Inpatient Procedure: 2D Echo, Cardiac Doppler and Color Doppler Indications:    R55 Syncope  History:        Patient has no prior history  of Echocardiogram examinations.                 Risk Factors:Hypertension and Dyslipidemia. COVID-19 Positive.  Sonographer:    Elmarie Shiley Dance Referring Phys: [id number] Cecille Po MELVIN IMPRESSIONS  1. Left ventricular ejection fraction, by estimation, is 65 to 70%. The left ventricle has normal function. The left ventricle has no regional wall motion abnormalities. There is moderate asymmetric left ventricular hypertrophy of the septal segment. Left ventricular diastolic parameters are indeterminate.  2. Right ventricular systolic function is normal. The right ventricular size is normal. There is normal pulmonary artery systolic pressure. The estimated right ventricular systolic pressure is 19.2 mmHg.  3. Early diastolic compression of RV free wall and RA without obvious respiratory variation in mitral outflow. Tamponade is not suggested, but effusion has potential for  hemodynamic significance and should be followed. Consider limited echocardiogram in  the next 48-72 hours. Moderate pericardial effusion. The pericardial effusion is circumferential, but largest anteriorly.  4. The mitral valve is grossly normal. Trivial mitral valve regurgitation.  5. The aortic valve is tricuspid. Aortic valve regurgitation is mild. Mild to moderate aortic valve sclerosis/calcification is present, without any evidence of aortic stenosis. Aortic regurgitation PHT measures 620 msec.  6. Aortic dilatation noted. There is mild dilatation of the ascending aorta, measuring 42 mm.  7. The inferior vena cava is normal in size with greater than 50% respiratory variability, suggesting right atrial pressure of 3 mmHg. FINDINGS  Left Ventricle: Left ventricular ejection fraction, by estimation, is 65 to 70%. The left ventricle has normal function. The left ventricle has no regional wall motion abnormalities. The left ventricular internal cavity size was normal in size. There is  moderate asymmetric left ventricular hypertrophy of the septal segment. Left ventricular diastolic parameters are indeterminate. Right Ventricle: The right ventricular size is normal. No increase in right ventricular wall thickness. Right ventricular systolic function is normal. There is normal pulmonary artery systolic pressure. The tricuspid regurgitant velocity is 2.01 m/s, and  with an assumed right atrial pressure of 3 mmHg, the estimated right ventricular systolic pressure is 19.2 mmHg. Left Atrium: Left atrial size was normal in size. Right Atrium: Right atrial size was normal in size. Pericardium: Early diastolic compression of RV free wall and RA without obvious respiratory variation in mitral outflow. Tamponade is not suggested, but effusion has potential for hemodynamic significance and should be followed. Consider limited echocardiogram in the next 48-72 hours. A moderately sized pericardial effusion is present. The  pericardial effusion is circumferential. Mitral Valve: The mitral valve is grossly normal. Trivial mitral valve regurgitation. Tricuspid Valve: The tricuspid valve is grossly normal. Tricuspid valve regurgitation is trivial. Aortic Valve: The aortic valve is tricuspid. There is mild aortic valve annular calcification. Aortic valve regurgitation is mild. Aortic regurgitation PHT measures 620 msec. Mild to moderate aortic valve sclerosis/calcification is present, without any evidence of aortic stenosis. Pulmonic Valve: The pulmonic valve was grossly normal. Pulmonic valve regurgitation is trivial. Aorta: Aortic dilatation noted. There is mild dilatation of the ascending aorta, measuring 42 mm. Venous: The inferior vena cava is normal in size with greater than 50% respiratory variability, suggesting right atrial pressure of 3 mmHg. IAS/Shunts: No atrial level shunt detected by color flow Doppler.  LEFT VENTRICLE PLAX 2D LVIDd:         4.50 cm Diastology LVIDs:         2.40 cm LV e' medial:    4.03 cm/s LV PW:  1.00 cm LV E/e' medial:  17.0 LV IVS:        1.60 cm LV e' lateral:   5.55 cm/s                        LV E/e' lateral: 12.4  RIGHT VENTRICLE             IVC RV Basal diam:  2.90 cm     IVC diam: 1.70 cm RV S prime:     14.10 cm/s TAPSE (M-mode): 1.8 cm LEFT ATRIUM             Index       RIGHT ATRIUM           Index LA diam:        3.50 cm 1.80 cm/m  RA Area:     11.20 cm LA Vol (A2C):   52.4 ml 26.96 ml/m RA Volume:   20.70 ml  10.65 ml/m LA Vol (A4C):   31.9 ml 16.41 ml/m LA Biplane Vol: 41.6 ml 21.41 ml/m  AORTIC VALVE LVOT Vmax:   99.10 cm/s LVOT Vmean:  64.200 cm/s LVOT VTI:    0.209 m AI PHT:      620 msec  AORTA Ao Asc diam: 4.20 cm MITRAL VALVE               TRICUSPID VALVE MV Area (PHT): 2.75 cm    TR Peak grad:   16.2 mmHg MV Decel Time: 276 msec    TR Vmax:        201.00 cm/s MV E velocity: 68.60 cm/s MV A velocity: 74.70 cm/s  SHUNTS MV E/A ratio:  0.92        Systemic VTI: 0.21 m  Nona Dell MD Electronically signed by Nona Dell MD Signature Date/Time: 05/08/2021/12:56:36 PM    Final    VAS [place] LOWER EXTREMITY VENOUS (DVT)  Result Date: 05/08/2021  Lower Venous DVT Study Patient Name:  ANUEL SITTER  Date of Exam:   05/08/2021 Medical Rec #: [id number]         Accession #:    [id number] Date of Birth: 08-26-1935         Patient Gender: M Patient Age:   85Y Exam Location[facility] Procedure:      VAS [place] LOWER EXTREMITY VENOUS (DVT) Referring Phys: [id number] Sefora Tietje --------------------------------------------------------------------------------  Indications: Edema, and COVID+.  Comparison Study: No previous exams Performing Technologist: Ernestene Mention  Examination Guidelines: A complete evaluation includes B-mode imaging, spectral Doppler, color Doppler, and power Doppler as needed of all accessible portions of each vessel. Bilateral testing is considered an integral part of a complete examination. Limited examinations for reoccurring indications may be performed as noted. The reflux portion of the exam is performed with the patient in reverse Trendelenburg.  +---------+---------------+---------+-----------+----------+--------------+ RIGHT    CompressibilityPhasicitySpontaneityPropertiesThrombus Aging +---------+---------------+---------+-----------+----------+--------------+ CFV      Full           Yes      Yes                                 +---------+---------------+---------+-----------+----------+--------------+ SFJ      Full                                                        +---------+---------------+---------+-----------+----------+--------------+  FV Prox  Full           Yes      Yes                                 +---------+---------------+---------+-----------+----------+--------------+ FV Mid   Full           Yes      Yes                                  +---------+---------------+---------+-----------+----------+--------------+ FV DistalFull           Yes      Yes                                 +---------+---------------+---------+-----------+----------+--------------+ PFV      Full                                                        +---------+---------------+---------+-----------+----------+--------------+ POP      Full           Yes      Yes                                 +---------+---------------+---------+-----------+----------+--------------+ PTV      Full                                                        +---------+---------------+---------+-----------+----------+--------------+ PERO     Full                                                        +---------+---------------+---------+-----------+----------+--------------+   +---------+---------------+---------+-----------+----------+--------------+ LEFT     CompressibilityPhasicitySpontaneityPropertiesThrombus Aging +---------+---------------+---------+-----------+----------+--------------+ CFV      Full           Yes      Yes                                 +---------+---------------+---------+-----------+----------+--------------+ SFJ      Full                                                        +---------+---------------+---------+-----------+----------+--------------+ FV Prox  Full           Yes      Yes                                 +---------+---------------+---------+-----------+----------+--------------+ FV Mid   Full  Yes      Yes                                 +---------+---------------+---------+-----------+----------+--------------+ FV DistalFull           Yes      Yes                                 +---------+---------------+---------+-----------+----------+--------------+ PFV      Full                                                         +---------+---------------+---------+-----------+----------+--------------+ POP      Full           Yes      Yes                                 +---------+---------------+---------+-----------+----------+--------------+ PTV      Full                                                        +---------+---------------+---------+-----------+----------+--------------+ PERO     Full                                                        +---------+---------------+---------+-----------+----------+--------------+     Summary: BILATERAL: - No evidence of deep vein thrombosis seen in the lower extremities, bilaterally. - RIGHT: - A cystic structure is found in the popliteal fossa.  LEFT: - A cystic structure is found in the popliteal fossa.  *See table(s) above for measurements and observations. Electronically signed by Lemar Livings MD on 05/08/2021 at 11:05:11 AM.    Final         Scheduled Meds: . divalproex  1,000 mg Oral QHS  . donepezil  20 mg Oral QHS  . enoxaparin (LOVENOX) injection  40 mg Subcutaneous Q24H  . levETIRAcetam  500 mg Oral BID  . memantine  10 mg Oral BID  . risperiDONE  0.5 mg Oral QHS  . tamsulosin  0.4 mg Oral Daily   Continuous Infusions: . sodium chloride       LOS: 0 days   Time spent: Greater than 50% of this time was spent in counseling, explanation of diagnosis, planning of further management, and coordination of care.   Voice Recognition Reubin Milan dictation system was used to create this note, attempts have been made to correct errors. Please contact the author with questions and/or clarifications.   Albertine Grates, MD PhD FACP [facility]  Available via Epic secure chat 7am-7pm for nonurgent issues Please page for urgent issues To page the attending provider between 7A-7P or the covering provider during after hours 7P-7A, please log into the web site [email or web address] and access using universal [place] password for that web site. If  you do not have the password, please call the hospital operator.    05/08/2021, 7:51 PM

## 2021-05-08 NOTE — Progress Notes (Signed)
BLE venous duplex has been completed.  Results can be found under chart review under CV PROC. 05/08/2021 10:48 AM Audrick Lamoureaux RVT, RDMS

## 2021-05-09 ENCOUNTER — Observation Stay (HOSPITAL_COMMUNITY): Payer: Medicare Other

## 2021-05-09 ENCOUNTER — Encounter (HOSPITAL_COMMUNITY): Payer: Self-pay | Admitting: Internal Medicine

## 2021-05-09 DIAGNOSIS — Z7189 Other specified counseling: Secondary | ICD-10-CM | POA: Diagnosis not present

## 2021-05-09 DIAGNOSIS — E785 Hyperlipidemia, unspecified: Secondary | ICD-10-CM | POA: Diagnosis present

## 2021-05-09 DIAGNOSIS — Z20822 Contact with and (suspected) exposure to covid-19: Secondary | ICD-10-CM | POA: Diagnosis present

## 2021-05-09 DIAGNOSIS — I951 Orthostatic hypotension: Secondary | ICD-10-CM | POA: Diagnosis present

## 2021-05-09 DIAGNOSIS — Z888 Allergy status to other drugs, medicaments and biological substances status: Secondary | ICD-10-CM | POA: Diagnosis not present

## 2021-05-09 DIAGNOSIS — F0390 Unspecified dementia without behavioral disturbance: Secondary | ICD-10-CM

## 2021-05-09 DIAGNOSIS — R55 Syncope and collapse: Secondary | ICD-10-CM | POA: Diagnosis present

## 2021-05-09 DIAGNOSIS — Z515 Encounter for palliative care: Secondary | ICD-10-CM | POA: Diagnosis not present

## 2021-05-09 DIAGNOSIS — N138 Other obstructive and reflux uropathy: Secondary | ICD-10-CM | POA: Diagnosis present

## 2021-05-09 DIAGNOSIS — E869 Volume depletion, unspecified: Secondary | ICD-10-CM | POA: Diagnosis present

## 2021-05-09 DIAGNOSIS — I313 Pericardial effusion (noninflammatory): Secondary | ICD-10-CM

## 2021-05-09 DIAGNOSIS — I452 Bifascicular block: Secondary | ICD-10-CM | POA: Diagnosis present

## 2021-05-09 DIAGNOSIS — Z801 Family history of malignant neoplasm of trachea, bronchus and lung: Secondary | ICD-10-CM | POA: Diagnosis not present

## 2021-05-09 DIAGNOSIS — Z91018 Allergy to other foods: Secondary | ICD-10-CM | POA: Diagnosis not present

## 2021-05-09 DIAGNOSIS — I10 Essential (primary) hypertension: Secondary | ICD-10-CM | POA: Diagnosis present

## 2021-05-09 DIAGNOSIS — G40909 Epilepsy, unspecified, not intractable, without status epilepticus: Secondary | ICD-10-CM | POA: Diagnosis present

## 2021-05-09 DIAGNOSIS — I309 Acute pericarditis, unspecified: Secondary | ICD-10-CM | POA: Diagnosis not present

## 2021-05-09 DIAGNOSIS — L89151 Pressure ulcer of sacral region, stage 1: Secondary | ICD-10-CM | POA: Diagnosis present

## 2021-05-09 DIAGNOSIS — R531 Weakness: Secondary | ICD-10-CM

## 2021-05-09 DIAGNOSIS — Z79899 Other long term (current) drug therapy: Secondary | ICD-10-CM | POA: Diagnosis not present

## 2021-05-09 DIAGNOSIS — Z8616 Personal history of COVID-19: Secondary | ICD-10-CM | POA: Diagnosis not present

## 2021-05-09 DIAGNOSIS — Z87891 Personal history of nicotine dependence: Secondary | ICD-10-CM | POA: Diagnosis not present

## 2021-05-09 DIAGNOSIS — U071 COVID-19: Principal | ICD-10-CM

## 2021-05-09 DIAGNOSIS — E860 Dehydration: Secondary | ICD-10-CM | POA: Diagnosis present

## 2021-05-09 DIAGNOSIS — N401 Enlarged prostate with lower urinary tract symptoms: Secondary | ICD-10-CM | POA: Diagnosis present

## 2021-05-09 DIAGNOSIS — G909 Disorder of the autonomic nervous system, unspecified: Secondary | ICD-10-CM | POA: Diagnosis present

## 2021-05-09 DIAGNOSIS — Z8249 Family history of ischemic heart disease and other diseases of the circulatory system: Secondary | ICD-10-CM | POA: Diagnosis not present

## 2021-05-09 DIAGNOSIS — R578 Other shock: Secondary | ICD-10-CM | POA: Diagnosis not present

## 2021-05-09 DIAGNOSIS — F0150 Vascular dementia without behavioral disturbance: Secondary | ICD-10-CM | POA: Diagnosis not present

## 2021-05-09 DIAGNOSIS — R680 Hypothermia, not associated with low environmental temperature: Secondary | ICD-10-CM | POA: Diagnosis present

## 2021-05-09 DIAGNOSIS — Z881 Allergy status to other antibiotic agents status: Secondary | ICD-10-CM | POA: Diagnosis not present

## 2021-05-09 LAB — ECHOCARDIOGRAM LIMITED: Weight: 2483.26 oz

## 2021-05-09 LAB — CBC WITH DIFFERENTIAL/PLATELET
Abs Immature Granulocytes: 0.02 10*3/uL (ref 0.00–0.07)
Basophils Absolute: 0 10*3/uL (ref 0.0–0.1)
Basophils Relative: 1 %
Eosinophils Absolute: 0.4 10*3/uL (ref 0.0–0.5)
Eosinophils Relative: 9 %
HCT: 43 % (ref 39.0–52.0)
Hemoglobin: 13.1 g/dL (ref 13.0–17.0)
Immature Granulocytes: 1 %
Lymphocytes Relative: 11 %
Lymphs Abs: 0.5 10*3/uL — ABNORMAL LOW (ref 0.7–4.0)
MCH: 26.1 pg (ref 26.0–34.0)
MCHC: 30.5 g/dL (ref 30.0–36.0)
MCV: 85.7 fL (ref 80.0–100.0)
Monocytes Absolute: 0.9 10*3/uL (ref 0.1–1.0)
Monocytes Relative: 21 %
Neutro Abs: 2.4 10*3/uL (ref 1.7–7.7)
Neutrophils Relative %: 57 %
Platelets: 271 10*3/uL (ref 150–400)
RBC: 5.02 MIL/uL (ref 4.22–5.81)
RDW: 13.7 % (ref 11.5–15.5)
WBC: 4.1 10*3/uL (ref 4.0–10.5)
nRBC: 0 % (ref 0.0–0.2)

## 2021-05-09 LAB — URINE CULTURE: Culture: NO GROWTH

## 2021-05-09 LAB — BRAIN NATRIURETIC PEPTIDE: B Natriuretic Peptide: 114.1 pg/mL — ABNORMAL HIGH (ref 0.0–100.0)

## 2021-05-09 LAB — BASIC METABOLIC PANEL
Anion gap: 7 (ref 5–15)
BUN: 9 mg/dL (ref 8–23)
CO2: 27 mmol/L (ref 22–32)
Calcium: 9.1 mg/dL (ref 8.9–10.3)
Chloride: 107 mmol/L (ref 98–111)
Creatinine, Ser: 0.62 mg/dL (ref 0.61–1.24)
GFR, Estimated: >60 mL/min (ref >60)
Glucose, Bld: 100 mg/dL — ABNORMAL HIGH (ref 70–99)
Potassium: 3.6 mmol/L (ref 3.5–5.1)
Sodium: 141 mmol/L (ref 135–145)

## 2021-05-09 LAB — MRSA PCR SCREENING: MRSA by PCR: NEGATIVE

## 2021-05-09 LAB — C-REACTIVE PROTEIN: CRP: 2.1 mg/dL — ABNORMAL HIGH (ref <1.0)

## 2021-05-09 LAB — D-DIMER, QUANTITATIVE: D-Dimer, Quant: 0.87 ug/mL-FEU — ABNORMAL HIGH (ref 0.00–0.50)

## 2021-05-09 LAB — MAGNESIUM: Magnesium: 2.1 mg/dL (ref 1.7–2.4)

## 2021-05-09 LAB — LACTIC ACID, PLASMA: Lactic Acid, Venous: 0.6 mmol/L (ref 0.5–1.9)

## 2021-05-09 MED ORDER — SODIUM CHLORIDE (PF) 0.9 % IJ SOLN
INTRAMUSCULAR | Status: AC
  Filled 2021-05-09: qty 50

## 2021-05-09 MED ORDER — IOHEXOL 350 MG/ML SOLN: 100.0000 mL | Freq: Once | INTRAVENOUS | Status: DC | PRN

## 2021-05-09 MED ORDER — ORAL CARE MOUTH RINSE
15.0000 mL | Freq: Two times a day (BID) | OROMUCOSAL | Status: DC
  Administered 2021-05-09 – 2021-05-12 (×5): 15 mL via OROMUCOSAL

## 2021-05-09 MED ORDER — COSYNTROPIN 0.25 MG IJ SOLR
0.2500 mg | Freq: Once | INTRAMUSCULAR | Status: AC
  Administered 2021-05-10: 0.25 mg via INTRAVENOUS
  Filled 2021-05-09: qty 0.25

## 2021-05-09 MED ORDER — SODIUM CHLORIDE 0.9 % IV BOLUS
1000.0000 mL | Freq: Once | INTRAVENOUS | Status: AC
  Administered 2021-05-09: 1000 mL via INTRAVENOUS

## 2021-05-09 MED ORDER — POTASSIUM CHLORIDE CRYS ER 20 MEQ PO TBCR
40.0000 meq | EXTENDED_RELEASE_TABLET | Freq: Once | ORAL | Status: AC
  Administered 2021-05-09: 40 meq via ORAL
  Filled 2021-05-09: qty 2

## 2021-05-09 MED ORDER — LACTATED RINGERS IV SOLN: INTRAVENOUS | Status: DC

## 2021-05-09 MED ORDER — CHLORHEXIDINE GLUCONATE CLOTH 2 % EX PADS
6.0000 | MEDICATED_PAD | Freq: Every day | CUTANEOUS | Status: DC
  Administered 2021-05-09: 6 via TOPICAL

## 2021-05-09 MED ORDER — AMLODIPINE BESYLATE 5 MG PO TABS: 5.0000 mg | ORAL_TABLET | Freq: Every day | ORAL | Status: DC

## 2021-05-09 MED ORDER — MIDODRINE HCL 5 MG PO TABS
10.0000 mg | ORAL_TABLET | Freq: Three times a day (TID) | ORAL | Status: DC
  Administered 2021-05-09 – 2021-05-10 (×3): 10 mg via ORAL
  Filled 2021-05-09 (×3): qty 2

## 2021-05-09 NOTE — Consult Note (Addendum)
Cardiology Consultation:   Patient ID: [name] [name] MRN: [id number]; DOB: 01/20/1935  Admit date: 05/07/2021 Date of Consult: 05/09/2021  PCP:  [name], MD   [facility] Providers Cardiologist:  New   Patient Profile:   [name] [name] is a 86 y.o. male with a hx of dementia, hypertension, hyperlipidemia who is being seen 05/09/2021 for the evaluation of pericardial effusion and question syncope at the request of Albertine Grates MD.  History of Present Illness:   Patient has significant dementia and history is gleaned from the chart.  At present he knows it is 2022 but is not oriented to place.  Patient apparently has been seen for unresponsive episodes.  He was placed on Keppra and Depakote for possible seizures.  He apparently has continued to have falls and "unresponsive episodes".  Note he also has been COVID-positive since April.  At present he denies dyspnea, chest pain, history of syncope.  He denies dizziness with standing or diminished appetite though history is unreliable with his underlying dementia.  Echocardiogram was ordered showing pericardial effusion and cardiology asked to evaluate.   Past Medical History:  Diagnosis Date  . Allergy   . Dementia [facility]    sees Dr. Patrcia Dolly   . Diverticulosis of colon (without mention of hemorrhage) 03-01-2004, 04-04-2011   Colonoscopy  . ED (erectile dysfunction)   . Glaucoma    sees Dr. Arvil Chaco   . Hyperglycemia   . Hyperlipidemia   . Hypertension   . Internal hemorrhoids 03-15-1999   Flex Sig   . Irritable bowel     Past Surgical History:  Procedure Laterality Date  . COLONOSCOPY  04-04-11   per Dr. Jarold Motto, clear, no repeats needed  . EYE SURGERY     bilateral cataract extraction per Dr Jettie Pagan  . INGUINAL HERNIA REPAIR    . KNEE ARTHROSCOPY     right knee  . NASAL SINUS SURGERY       Inpatient Medications: Scheduled Meds: . divalproex  1,000 mg Oral QHS  . donepezil  20 mg Oral QHS  . enoxaparin  (LOVENOX) injection  40 mg Subcutaneous Q24H  . levETIRAcetam  500 mg Oral BID  . memantine  10 mg Oral BID  . risperiDONE  0.5 mg Oral QHS  . tamsulosin  0.4 mg Oral Daily   Continuous Infusions: . sodium chloride 100 mL/hr at 05/09/21 0714   PRN Meds: acetaminophen **OR** acetaminophen, polyethylene glycol  Allergies:    Allergies  Allergen Reactions  . Chocolate     Itching if he eats too much chocolate   . Ciprofloxacin     Achilles tendon pain   . Lipitor [Atorvastatin]     Muscle aches     Social History:   Social History   Socioeconomic History  . Marital status: Married    Spouse name: Not on file  . Number of children: 2  . Years of education: 75  . Highest education level: Not on file  Occupational History  . Occupation: retired    Associate Professor: RETIRED  Tobacco Use  . Smoking status: Former Smoker    Quit date: 03/20/1969    Years since quitting: 52.1  . Smokeless tobacco: Never Used  Vaping Use  . Vaping Use: Never used  Substance and Sexual Activity  . Alcohol use: Yes    Alcohol/week: 0.0 standard drinks    Comment: occ  . Drug use: No  . Sexual activity: Not on file  Other Topics Concern  .  Not on file  Social History Narrative   Right handed   Drinks caffeine   One story home   Social Determinants of Health   Financial Resource Strain: Not on file  Food Insecurity: Not on file  Transportation Needs: Not on file  Physical Activity: Not on file  Stress: Not on file  Social Connections: Not on file  Intimate Partner Violence: Not on file    Family History:    Family History  Problem Relation Age of Onset  . Hypertension Mother   . Lung cancer Father   . Colitis Daughter      ROS:  Please see the history of present illness.  Patient denies any other symptoms but history somewhat unreliable given dementia. All other ROS reviewed and negative.     Physical Exam/Data:   Vitals:   05/08/21 2056 05/08/21 2154 05/09/21 0431 05/09/21  0434  BP: (!) 148/123 (!) 174/79 123/72   Pulse: 80 62 60   Resp: 18  14   Temp: 97.7 F (36.5 C)  (!) 97.5 F (36.4 C)   TempSrc: Oral  Oral   SpO2: 98%  100%   Weight:    70.4 kg    Intake/Output Summary (Last 24 hours) at 05/09/2021 0742 Last data filed at 05/08/2021 1916 Gross per 24 hour  Intake 1200 ml  Output 2200 ml  Net -1000 ml   Last 3 Weights 05/09/2021 05/08/2021 05/04/2021  Weight (lbs) 155 lb 3.3 oz 157 lb 6.4 oz 169 lb 12.1 oz  Weight (kg) 70.4 kg 71.396 kg 77 kg     Body mass index is 20.48 kg/m.  General:  Well nourished, well developed, in no acute distress; elderly HEENT: normal Lymph: no adenopathy Neck: no JVD Endocrine:  No thryomegaly Vascular: No carotid bruits; FA pulses 2+ bilaterally without bruits  Cardiac:  normal S1, S2; RRR; no murmur  Lungs:  clear to auscultation bilaterally, no wheezing, rhonchi or rales  Abd: soft, nontender, no hepatomegaly  Ext: no edema Musculoskeletal: Normal Skin: warm and dry  Neuro:  CNs 2-12 intact, no focal abnormalities noted, moves all extremities, dementia noted Psych: Answers questions  EKG:  The EKG was personally reviewed and demonstrates: Sinus bradycardia at a rate of 59, left anterior fascicular block, right bundle branch block Telemetry:  Telemetry was personally reviewed and demonstrates: Sinus rhythm  Laboratory Data:  High Sensitivity Troponin:   Recent Labs  Lab 05/04/21 1000 05/04/21 1202  TROPONINIHS 15 17     Chemistry Recent Labs  Lab 05/07/21 1312 05/08/21 0350 05/09/21 0232  NA 139 142 141  K 3.8 3.7 3.6  CL 104 107 107  CO2 29 30 27   GLUCOSE 188* 116* 100*  BUN 15 9 9   CREATININE 0.92 0.65 0.62  CALCIUM 8.7* 8.9 9.1  GFRNONAA >60 >60 >60  ANIONGAP 6 5 7     Recent Labs  Lab 05/04/21 1000 05/07/21 1312 05/08/21 0350  PROT 5.9* 5.5* 5.7*  ALBUMIN 3.4* 3.0* 3.1*  AST 33 35 34  ALT 39 30 29  ALKPHOS 51 49 54  BILITOT 0.3 0.4 0.4   Hematology Recent Labs  Lab  05/07/21 1312 05/08/21 0350 05/09/21 0232  WBC 3.9* 4.2 4.1  RBC 4.58 4.84 5.02  HGB 11.9* 12.7* 13.1  HCT 39.1 41.4 43.0  MCV 85.4 85.5 85.7  MCH 26.0 26.2 26.1  MCHC 30.4 30.7 30.5  RDW 14.0 13.8 13.7  PLT 231 242 271   BNP Recent Labs  Lab 05/09/21  0232  BNP 114.1*    DDimer  Recent Labs  Lab 05/08/21 0952 05/09/21 0232  DDIMER 1.01* 0.87*     Radiology/Studies:  CT Head Wo Contrast  Result Date: 05/07/2021 CLINICAL DATA:  Mental status change.  COVID positive patient. EXAM: CT HEAD WITHOUT CONTRAST TECHNIQUE: Contiguous axial images were obtained from the base of the skull through the vertex without intravenous contrast. COMPARISON:  Multiple prior exams, most recent head CT 3 days ago 05/04/2021 FINDINGS: Brain: Stable atrophy and chronic small vessel ischemia. No hemorrhage. No evidence of acute ischemia. Stable ventricular size and configuration. Dural based plaque-like calcification over lying the right frontotemporal lobe, unchanged from prior exams. No mass effect or midline shift. Vascular: No hyperdense vessel. Skull: No fracture or focal lesion. Sinuses/Orbits: Again seen mucosal thickening of ethmoid air cells and left frontal sinus. Mucous retention cyst left maxillary sinus. Other: None. IMPRESSION: 1. No acute intracranial abnormality. 2. Stable atrophy and chronic small vessel ischemia. 3. 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 This is unchanged from multiple prior exams. Electronically Signed   By: Narda Rutherford M.D.   On: 05/07/2021 17:06   DG Chest Port 1 View  Result Date: 05/07/2021 CLINICAL DATA:  Altered mental status EXAM: PORTABLE CHEST 1 VIEW COMPARISON:  Chest x-rays dated 05/04/2021 and 04/09/2021. FINDINGS: Heart size and mediastinal contours are stable. Lungs are clear. No pleural effusion or pneumothorax is seen. Osseous structures about the chest are unremarkable.  IMPRESSION: No active disease. No evidence of pneumonia or pulmonary edema. Electronically Signed   By: Bary Richard M.D.   On: 05/07/2021 14:20   ECHOCARDIOGRAM COMPLETE  Result Date: 05/08/2021    ECHOCARDIOGRAM REPORT   Patient Name:   [name] [name] Date of Exam: 05/08/2021 Medical Rec #:  [id number]        Height:       73.0 in Accession #:    [id number]       Weight:       157.4 lb Date of Birth:  [birth year]        BSA:          1.943 m Patient Age:    85 years         BP:           162/82 mmHg Patient Gender: M                HR:           55 bpm. Exam Location:  Inpatient Procedure: 2D Echo, Cardiac Doppler and Color Doppler Indications:    R55 Syncope  History:        Patient has no prior history of Echocardiogram examinations.                 Risk Factors:Hypertension and Dyslipidemia. COVID-19 Positive.  Sonographer:    Elmarie Shiley Dance Referring Phys: [id number] Cecille Po MELVIN IMPRESSIONS  1. Left ventricular ejection fraction, by estimation, is 65 to 70%. The left ventricle has normal function. The left ventricle has no regional wall motion abnormalities. There is moderate asymmetric left ventricular hypertrophy of the septal segment. Left ventricular diastolic parameters are indeterminate.  2. Right ventricular systolic function is normal. The right ventricular size is normal. There is normal pulmonary artery systolic pressure. The estimated right ventricular systolic pressure is 19.2 mmHg.  3. Early diastolic compression of RV free wall and RA without obvious respiratory variation in mitral outflow. Tamponade is not  suggested, but effusion has potential for hemodynamic significance and should be followed. Consider limited echocardiogram in  the next 48-72 hours. Moderate pericardial effusion. The pericardial effusion is circumferential, but largest anteriorly.  4. The mitral valve is grossly normal. Trivial mitral valve regurgitation.  5. The aortic valve is tricuspid. Aortic valve regurgitation is  mild. Mild to moderate aortic valve sclerosis/calcification is present, without any evidence of aortic stenosis. Aortic regurgitation PHT measures 620 msec.  6. Aortic dilatation noted. There is mild dilatation of the ascending aorta, measuring 42 mm.  7. The inferior vena cava is normal in size with greater than 50% respiratory variability, suggesting right atrial pressure of 3 mmHg. FINDINGS  Left Ventricle: Left ventricular ejection fraction, by estimation, is 65 to 70%. The left ventricle has normal function. The left ventricle has no regional wall motion abnormalities. The left ventricular internal cavity size was normal in size. There is  moderate asymmetric left ventricular hypertrophy of the septal segment. Left ventricular diastolic parameters are indeterminate. Right Ventricle: The right ventricular size is normal. No increase in right ventricular wall thickness. Right ventricular systolic function is normal. There is normal pulmonary artery systolic pressure. The tricuspid regurgitant velocity is 2.01 m/s, and  with an assumed right atrial pressure of 3 mmHg, the estimated right ventricular systolic pressure is 19.2 mmHg. Left Atrium: Left atrial size was normal in size. Right Atrium: Right atrial size was normal in size. Pericardium: Early diastolic compression of RV free wall and RA without obvious respiratory variation in mitral outflow. Tamponade is not suggested, but effusion has potential for hemodynamic significance and should be followed. Consider limited echocardiogram in the next 48-72 hours. A moderately sized pericardial effusion is present. The pericardial effusion is circumferential. Mitral Valve: The mitral valve is grossly normal. Trivial mitral valve regurgitation. Tricuspid Valve: The tricuspid valve is grossly normal. Tricuspid valve regurgitation is trivial. Aortic Valve: The aortic valve is tricuspid. There is mild aortic valve annular calcification. Aortic valve regurgitation is  mild. Aortic regurgitation PHT measures 620 msec. Mild to moderate aortic valve sclerosis/calcification is present, without any evidence of aortic stenosis. Pulmonic Valve: The pulmonic valve was grossly normal. Pulmonic valve regurgitation is trivial. Aorta: Aortic dilatation noted. There is mild dilatation of the ascending aorta, measuring 42 mm. Venous: The inferior vena cava is normal in size with greater than 50% respiratory variability, suggesting right atrial pressure of 3 mmHg. IAS/Shunts: No atrial level shunt detected by color flow Doppler.  LEFT VENTRICLE PLAX 2D LVIDd:         4.50 cm Diastology LVIDs:         2.40 cm LV e' medial:    4.03 cm/s LV PW:         1.00 cm LV E/e' medial:  17.0 LV IVS:        1.60 cm LV e' lateral:   5.55 cm/s                        LV E/e' lateral: 12.4  RIGHT VENTRICLE             IVC RV Basal diam:  2.90 cm     IVC diam: 1.70 cm RV S prime:     14.10 cm/s TAPSE (M-mode): 1.8 cm LEFT ATRIUM             Index       RIGHT ATRIUM           Index LA diam:  3.50 cm 1.80 cm/m  RA Area:     11.20 cm LA Vol (A2C):   52.4 ml 26.96 ml/m RA Volume:   20.70 ml  10.65 ml/m LA Vol (A4C):   31.9 ml 16.41 ml/m LA Biplane Vol: 41.6 ml 21.41 ml/m  AORTIC VALVE LVOT Vmax:   99.10 cm/s LVOT Vmean:  64.200 cm/s LVOT VTI:    0.209 m AI PHT:      620 msec  AORTA Ao Asc diam: 4.20 cm MITRAL VALVE               TRICUSPID VALVE MV Area (PHT): 2.75 cm    TR Peak grad:   16.2 mmHg MV Decel Time: 276 msec    TR Vmax:        201.00 cm/s MV E velocity: 68.60 cm/s MV A velocity: 74.70 cm/s  SHUNTS MV E/A ratio:  0.92        Systemic VTI: 0.21 m Nona Dell MD Electronically signed by Nona Dell MD Signature Date/Time: 05/08/2021/12:56:36 PM    Final    VAS [place] LOWER EXTREMITY VENOUS (DVT)  Result Date: 05/08/2021  Lower Venous DVT Study Patient Name:  [name] [name]  Date of Exam:   05/08/2021 Medical Rec #: [id number]         Accession #:    [id number] Date of Birth: 06-28-1935          Patient Gender: M Patient Age:   85Y Exam Location:  [facility] Procedure:      VAS [place] LOWER EXTREMITY VENOUS (DVT) Referring Phys: [id number] FANG XU --------------------------------------------------------------------------------  Indications: Edema, and COVID+.  Comparison Study: No previous exams Performing Technologist: Ernestene Mention  Examination Guidelines: A complete evaluation includes B-mode imaging, spectral Doppler, color Doppler, and power Doppler as needed of all accessible portions of each vessel. Bilateral testing is considered an integral part of a complete examination. Limited examinations for reoccurring indications may be performed as noted. The reflux portion of the exam is performed with the patient in reverse Trendelenburg.  +---------+---------------+---------+-----------+----------+--------------+ RIGHT    CompressibilityPhasicitySpontaneityPropertiesThrombus Aging +---------+---------------+---------+-----------+----------+--------------+ CFV      Full           Yes      Yes                                 +---------+---------------+---------+-----------+----------+--------------+ SFJ      Full                                                        +---------+---------------+---------+-----------+----------+--------------+ FV Prox  Full           Yes      Yes                                 +---------+---------------+---------+-----------+----------+--------------+ FV Mid   Full           Yes      Yes                                 +---------+---------------+---------+-----------+----------+--------------+ FV DistalFull  Yes      Yes                                 +---------+---------------+---------+-----------+----------+--------------+ PFV      Full                                                        +---------+---------------+---------+-----------+----------+--------------+ POP      Full           Yes      Yes                                  +---------+---------------+---------+-----------+----------+--------------+ PTV      Full                                                        +---------+---------------+---------+-----------+----------+--------------+ PERO     Full                                                        +---------+---------------+---------+-----------+----------+--------------+   +---------+---------------+---------+-----------+----------+--------------+ LEFT     CompressibilityPhasicitySpontaneityPropertiesThrombus Aging +---------+---------------+---------+-----------+----------+--------------+ CFV      Full           Yes      Yes                                 +---------+---------------+---------+-----------+----------+--------------+ SFJ      Full                                                        +---------+---------------+---------+-----------+----------+--------------+ FV Prox  Full           Yes      Yes                                 +---------+---------------+---------+-----------+----------+--------------+ FV Mid   Full           Yes      Yes                                 +---------+---------------+---------+-----------+----------+--------------+ FV DistalFull           Yes      Yes                                 +---------+---------------+---------+-----------+----------+--------------+ PFV      Full                                                        +---------+---------------+---------+-----------+----------+--------------+  POP      Full           Yes      Yes                                 +---------+---------------+---------+-----------+----------+--------------+ PTV      Full                                                        +---------+---------------+---------+-----------+----------+--------------+ PERO     Full                                                         +---------+---------------+---------+-----------+----------+--------------+     Summary: BILATERAL: - No evidence of deep vein thrombosis seen in the lower extremities, bilaterally. - RIGHT: - A cystic structure is found in the popliteal fossa.  LEFT: - A cystic structure is found in the popliteal fossa.  *See table(s) above for measurements and observations. Electronically signed by Lemar Livings MD on 05/08/2021 at 11:05:11 AM.    Final      Assessment and Plan:   1. Unresponsive episodes-etiology unclear.  History is not helpful as patient has underlying dementia.  We have no documentation that patient was having arrhythmia or bradycardia at time of events.  This could be further assessed with event monitor though given his dementia not clear that this would be reliable in terms of activation.  Note LV function normal. 2. Pericardial effusion-I have personally reviewed the patient's echocardiogram.  LV function is normal.  There is moderate pericardial effusion with mild RV diastolic collapse.  However IVC is not dilated.  Clinically he is not in tamponade as his heart rate is in the 50s and he is hypertensive.  Can repeat echocardiogram early next week to make sure it is not progressing. 3. Hypertension-blood pressure is increasing.  Would not resume diltiazem (patient has a bifascicular block and diltiazem could contribute to bradycardia).  Instead we will treat with amlodipine 5 mg daily and follow. 4. Dementia/failure to thrive-Per primary care.  Palliative care consult may be helpful.  Addendum-I was contacted and noted that patient's systolic blood pressure had decreased to 79.  He was given IV fluids with improvement to systolic of 117.  He continues to deny dyspnea or chest pain.  He is lying flat with no shortness of breath.  He does not have jugular venous distention.  His heart rate remains in the 50s.  Not clear to me that hypotension is related to pericardial fusion/tamponade.  We will  repeat echocardiogram this morning and reassess.  Hold antihypertensives.  Addendum-I have reviewed the patient's echocardiogram with Dr. Gala Romney and Dr. Diona Browner.  Effusion is not increasing in size compared to yesterday.  There is no diagnostic flow variation across the mitral valve and the IVC collapses.  Patient's blood pressure has improved with hydration.  Heart rate remains in the 50s.  He is lying flat with no dyspnea and does not appear to have significant JVD.  I do not feel that at present this is tamponade physiology.  Diffusion will need  to be followed but there is no indication at this point for pericardiocentesis. Olga Millers, MD    For questions or updates, please contact [facility] Please consult [email or web address] for contact info under    Signed, Olga Millers, MD  05/09/2021 7:42 AM

## 2021-05-09 NOTE — Progress Notes (Signed)
  Echocardiogram 2D Echocardiogram limited with color, doppler, and 3D has been performed.  Leta Jungling M 05/09/2021, 9:26 AM

## 2021-05-09 NOTE — Progress Notes (Signed)
12:19 PM  Pt's spouse Eduardo Osier and updated as to pt's transfer to step down. Pt's spouse given new room number and has no further questions at this time.

## 2021-05-09 NOTE — Consult Note (Signed)
NAME:  LABRIAN TORREGROSSA, MRN:  [id number], DOB:  [birth year], LOS: 0 ADMISSION DATE:  05/07/2021, CONSULTATION DATE:  05/09/21 REFERRING MD:  Roda Shutters, CHIEF COMPLAINT:  unresponsive   History of Present Illness:  86 year old man with history of dementia, questionable seizure disorder presenting with progressive failure to thrive and an episode of unresponsiveness at home.  COVID returned positive. CXR clear.  This AM became more hypotensive and hypothermic.  PCCM consulted for further recs.  Pertinent  Medical History  Dementia BPH On AEDs for question of seizure  Significant Hospital Events: Including procedures, antibiotic start and stop dates in addition to other pertinent events   .  5/14 admitted  Interim History / Subjective:  Consulted Patient denies all symptoms.  Objective   Blood pressure 120/75, pulse 60, temperature (!) 96 F (35.6 C), temperature source Rectal, resp. rate 17, weight 70.4 kg, SpO2 97 %.        Intake/Output Summary (Last 24 hours) at 05/09/2021 1108 Last data filed at 05/09/2021 1101 Gross per 24 hour  Intake 960 ml  Output 1900 ml  Net -940 ml   Filed Weights   05/08/21 0500 05/09/21 0434  Weight: 71.4 kg 70.4 kg    Examination: General: chronically ill man in NAD HENT: MM dry, trachea midline, neck veins flat Lungs: clear, no accessory muscle use Cardiovascular: distant, ext warm Abdomen: soft, +BS Extremities: no edema, +muscle wasting Neuro: moves all 4 ext to command, weak Skin: no rashes  Labs/imaging that I have personally reviewed  (right click and "Reselect all SmartList Selections" daily)  Mild lymphopenia COVID positive Echo reviewed: moderate effusion, diastolic collapse but no mitral flow reversal  Resolved Hospital Problem list   n/a  Assessment & Plan:  Shock/vasoplegia- in context of history of hypertension which seems to have resolved since he has lost weight. Of more concern is his recurrent falls and FTT along with  dementia.  Pericardial effusion with diastolic collapse- no flow reversal with color flow arguing against tampondade.  Physical exam also less c/w this diagnosis.   Cause of pericardial effusion unclear, cardiology following.  Working diagnosis from my standpoint is dysautonomia (related to COVID vs. Alzheimer's) or dehydration.  Low suspicion for infection.  Will keep in mind that TTE is not 100% sensitive for tamponade.  - Stop alpha blocker - Start midodrine - IV hydration with balanced crystalloid - AM cortisol stim - Agree with palliative consult for realistic GOC discussion - Check lactate  Best practice (right click and "Reselect all SmartList Selections" daily)  Per primary  Labs   CBC: Recent Labs  Lab 05/04/21 1000 05/07/21 1312 05/08/21 0350 05/09/21 0232  WBC 5.9 3.9* 4.2 4.1  NEUTROABS 4.5 2.6  --  2.4  HGB 12.5* 11.9* 12.7* 13.1  HCT 40.4 39.1 41.4 43.0  MCV 85.4 85.4 85.5 85.7  PLT 229 231 242 271    Basic Metabolic Panel: Recent Labs  Lab 05/04/21 1000 05/07/21 1312 05/08/21 0350 05/09/21 0232  NA 141 139 142 141  K 4.1 3.8 3.7 3.6  CL 106 104 107 107  CO2 25 29 30 27   GLUCOSE 133* 188* 116* 100*  BUN 15 15 9 9   CREATININE 0.75 0.92 0.65 0.62  CALCIUM 9.1 8.7* 8.9 9.1  MG  --   --   --  2.1   GFR: Estimated Creatinine Clearance: 69.7 mL/min (by C-G formula based on SCr of 0.62 mg/dL). Recent Labs  Lab 05/04/21 1000 05/07/21 1312 05/08/21 0350  05/09/21 0232  WBC 5.9 3.9* 4.2 4.1  LATICACIDVEN  --  1.8  --   --     Liver Function Tests: Recent Labs  Lab 05/04/21 1000 05/07/21 1312 05/08/21 0350  AST 33 35 34  ALT 39 30 29  ALKPHOS 51 49 54  BILITOT 0.3 0.4 0.4  PROT 5.9* 5.5* 5.7*  ALBUMIN 3.4* 3.0* 3.1*   Recent Labs  Lab 05/07/21 1312  LIPASE 26   No results for input(s): AMMONIA in the last 168 hours.  ABG    Component Value Date/Time   HCO3 25.8 03/09/2021 2009   O2SAT 72.8 03/09/2021 2009     Coagulation  Profile: No results for input(s): INR, PROTIME in the last 168 hours.  Cardiac Enzymes: No results for input(s): CKTOTAL, CKMB, CKMBINDEX, TROPONINI in the last 168 hours.  HbA1C: Hgb A1c MFr Bld  Date/Time Value Ref Range Status  03/09/2021 02:13 PM 5.6 4.8 - 5.6 % Final    Comment:    (NOTE) Pre diabetes:          5.7%-6.4%  Diabetes:              >6.4%  Glycemic control for   <7.0% adults with diabetes   02/22/2019 09:35 AM 5.6 4.6 - 6.5 % Final    Comment:    Glycemic Control Guidelines for People with Diabetes:Non Diabetic:  <6%Goal of Therapy: <7%Additional Action Suggested:  >8%     CBG: No results for input(s): GLUCAP in the last 168 hours.  Review of Systems:    Positive Symptoms in bold:  Constitutional fevers, chills, weight loss, fatigue, anorexia, malaise  Eyes decreased vision, double vision, eye irritation  Ears, Nose, Mouth, Throat sore throat, trouble swallowing, sinus congestion  Cardiovascular chest pain, paroxysmal nocturnal dyspnea, lower ext edema, palpitations   Respiratory SOB, cough, DOE, hemoptysis, wheezing  Gastrointestinal nausea, vomiting, diarrhea  Genitourinary burning with urination, trouble urinating  Musculoskeletal joint aches, joint swelling, back pain  Integumentary  rashes, skin lesions  Neurological focal weakness, focal numbness, trouble speaking, headaches  Psychiatric depression, anxiety, confusion  Endocrine polyuria, polydipsia, cold intolerance, heat intolerance  Hematologic abnormal bruising, abnormal bleeding, unexplained nose bleeds  Allergic/Immunologic recurrent infections, hives, swollen lymph nodes     Past Medical History:  He,  has a past medical history of Allergy, Dementia (HCC), Diverticulosis of colon (without mention of hemorrhage) (03-01-2004, 04-04-2011), ED (erectile dysfunction), Glaucoma, Hyperglycemia, Hyperlipidemia, Hypertension, Internal hemorrhoids (03-15-1999), and Irritable bowel.   Surgical  History:   Past Surgical History:  Procedure Laterality Date  . COLONOSCOPY  04-04-11   per Dr. Jarold Motto, clear, no repeats needed  . EYE SURGERY     bilateral cataract extraction per Dr Jettie Pagan  . INGUINAL HERNIA REPAIR    . KNEE ARTHROSCOPY     right knee  . NASAL SINUS SURGERY       Social History:   reports that he quit smoking about 52 years ago. He has never used smokeless tobacco. He reports current alcohol use. He reports that he does not use drugs.   Family History:  His family history includes Colitis in his daughter; Hypertension in his mother; Lung cancer in his father.   Allergies Allergies  Allergen Reactions  . Chocolate     Itching if he eats too much chocolate   . Ciprofloxacin     Achilles tendon pain   . Lipitor [Atorvastatin]     Muscle aches      Home Medications  Prior to Admission medications   Medication Sig Start Date End Date Taking? Authorizing Provider  AZOPT 1 % ophthalmic suspension Place 1 drop into both eyes 3 (three) times daily. 12/18/19  Yes [provider]  bimatoprost (LUMIGAN) 0.01 % SOLN Place 1 drop into both eyes at bedtime.   Yes [provider]  cetirizine (ZYRTEC) 10 MG tablet Take 10 mg by mouth daily as needed for allergies.   Yes [provider]  Coenzyme Q10 (CO Q 10 PO) Take 1 capsule by mouth daily.   Yes [provider]  diltiazem (DILTIAZEM CD) 120 MG 24 hr capsule Take 1 capsule (120 mg total) by mouth daily. 04/07/21  Yes [name], MD  divalproex (DEPAKOTE ER) 500 MG 24 hr tablet Take 1 tablet every night Patient taking differently: Take 500 mg by mouth daily. 03/29/21  Yes Van Clines, MD  donepezil (ARICEPT) 10 MG tablet Take 20 mg by mouth at bedtime. 03/06/21  Yes [provider]  fish oil-omega-3 fatty acids 1000 MG capsule Take 1 g by mouth daily.   Yes [provider]  Glucosamine-Chondroitin 500-400 MG CAPS Take 1 tablet by mouth daily.   Yes [provider]  levETIRAcetam (KEPPRA) 500 MG tablet TAKE 1 TABLET (500 MG TOTAL) BY MOUTH TWO TIMES DAILY. Patient taking differently: Take 500 mg by mouth 2 (two) times daily. 03/11/21 03/11/22 Yes [name], MD  melatonin 5 MG TABS Take 5 mg by mouth at bedtime.   Yes [provider]  memantine (NAMENDA) 10 MG tablet Take 1 tablet twice a day Patient taking differently: Take 10 mg by mouth 2 (two) times daily. 03/22/21  Yes Van Clines, MD  MILK THISTLE PO Take 1 capsule by mouth daily.   Yes [provider]  Multiple Vitamins-Minerals (MULTIVITAMIN WITH MINERALS) tablet Take 1 tablet by mouth daily.   Yes [provider]  polyethylene glycol (MIRALAX / GLYCOLAX) 17 g packet Take 17 g by mouth daily as needed for moderate constipation.   Yes [provider]  RHOPRESSA 0.02 % SOLN Place 1 drop into both eyes at bedtime. 08/17/19  Yes [provider]  risperiDONE (RISPERDAL) 0.5 MG tablet Take 1 tablet (0.5 mg total) by mouth at bedtime. 02/25/20  Yes [name], MD  tamsulosin (FLOMAX) 0.4 MG CAPS capsule Take 1 capsule (0.4 mg total) by mouth daily. 02/25/21  Yes [name], MD  terazosin (HYTRIN) 2 MG capsule Take 2 capsules (4 mg total) by mouth at bedtime. 02/25/21  Yes [name], MD     Critical care time: 32 minutes

## 2021-05-09 NOTE — Progress Notes (Addendum)
PROGRESS NOTE    [name] [name]  WUJ:[id number] DOB: 21-Jul-1935 DOA: 05/07/2021 PCP: [name] Salisbury, MD    Chief Complaint  Patient presents with  . Hypotension    Brief Narrative:  [name] Iorio McLaughlinis a 86 y.o.malewith medical history significant ofadvanced dementia, glaucoma, BPH, diverticulosis, gi bleed, HTN, was hospitalized in 02/2021 due to unresponsiveness, neurology was consulted, he was started on Keppra for possible seizure, He followed up outpatient and Depakote 500 mg daily was added .He has been at a rehab facility since that admission in March and returned home about a week or so ago.  He has been seen for falls and similar unresponsive episodes at the end of April and early May.   He had an episode of unresponsiveness today while sitting in a chair.  When EMS was called out , blood pressure initially noted to be on the low side and also hypothermic.             Has improved back to baseline in ED. he denies any symptoms in particular was not aware of the event that caused him to present (the unresponsive event).  Subjective:  RN reports patient being hypotensive, sbp in the 70's ( repeated three times) He is demented, he is sleeping  this am, ( wife states patient sleeps during day time, he ahs sundonwing around 5pm on some days at home ) He is on room air  Assessment & Plan:   Principal Problem:   Syncope Active Problems:   Dyslipidemia   Essential hypertension   BPH with urinary obstruction   Dementia (HCC)   Seizure (HCC)  Unresponsiveness episode/falls since March 2020/hypotension ( wife reports EMS checked his blood pressure at home , it was low) -Unclear etiology -CT head no acute findings -D-dimer mildly elevated at 1 ,venous doppler negative for DVT, patient cannot tolerate VQ scan due to confusion, will proceed with CTA to rule out PE ( developed significant hypotension  on 5/15) -echo showed circumferential pericardial effusion, he does has  orthostatic hypotension, there is no tachycardia -there is no sign of infection, no leukocytosis, blood culture negative, cxr no acute infiltrate, ua no bactereia -continue ivf, fluids bolus as well, consider pressures if not able to maintain blood pressure,- tsh unremarkable, check am cortisol level,  -case discussed with cardiology, will repeat echocardiogram  -persistent hypotension requiring multiple fluids boluses, will consult critical care, case discussed with Dr Katrinka Blazing  Seizure? EEG not seizure activity during last hospitalization He was started on keppra since last hospitalization, will continue for now Keppra level in process valproic  acid level on presentation is 21, 4 weeks ago was 53   HTN; hold cardizem due to orthostatic hypotension  BPH; continue flomax  Dementia  Patient has chronic dementia and is currently back to his baseline per reports. - Continue home Namenda and donepezil   Covid positive - Has been COVID-positive since April 25 at facility per wife.  Remains within the 90-day window of positivity. - will be  outside 21-day window for precautions on 5/16  FTT: frequent ED visit recently,  PT eval  Discussed with wife who agreed to talk to palliative care    Body mass index is 20.48 kg/m.Marland Kitchen      Skin Assessment: I have examined the patient's skin and I agree with the wound assessment as performed by the wound care RN as outlined below:  Pressure Injury 05/08/21 Coccyx Mid Stage 1 -  Intact skin with non-blanchable redness of a  localized area usually over a bony prominence. (Active)  05/08/21 0515  Location: Coccyx  Location Orientation: Mid  Staging: Stage 1 -  Intact skin with non-blanchable redness of a localized area usually over a bony prominence.  Wound Description (Comments):   Present on Admission: Yes    Unresulted Labs (From admission, onward)          Start     Ordered   05/14/21 0500  Creatinine, serum  (enoxaparin (LOVENOX)    CrCl  >/= 30 ml/min)  Weekly,   R     Comments: while on enoxaparin therapy    05/07/21 1920   05/10/21 0500  Cortisol  Tomorrow morning,   R       Question:  Specimen collection method  Answer:  Lab=Lab collect   05/09/21 0840   05/07/21 1915  Levetiracetam level  Once,   STAT        05/07/21 1914   05/07/21 1331  Urine Culture  ONCE - STAT,   STAT        05/07/21 1333            DVT prophylaxis: enoxaparin (LOVENOX) injection 40 mg Start: 05/07/21 2200   Code Status: Full Family Communication: wife over the phone on 5/15 Disposition:   Status is: inpatient  Dispo: The patient is from: Home              Anticipated d/c is to: Likely will need skilled nursing facility              Anticipated d/c date is: To be determined             Consultants:   Cardiology  Palliative care  Critical care  Procedures:   None  Antimicrobials:   None     Objective: Vitals:   05/09/21 0815 05/09/21 0820 05/09/21 0840 05/09/21 0845  BP: (!) 102/58 (!) 96/55 105/67 117/71  Pulse:      Resp: 14     Temp:   (!) 95.7 F (35.4 C)   TempSrc:   Rectal   SpO2:      Weight:        Intake/Output Summary (Last 24 hours) at 05/09/2021 0910 Last data filed at 05/09/2021 0813 Gross per 24 hour  Intake 1200 ml  Output 2200 ml  Net -1000 ml   Filed Weights   05/08/21 0500 05/09/21 0434  Weight: 71.4 kg 70.4 kg    Examination:  General exam: Very frail, demented elderly, sleeping Respiratory system: Clear to auscultation. Respiratory effort normal. Cardiovascular system: Distant heart sound, no edema Gastrointestinal system: Abdomen is nondistended, soft and nontender. Normal bowel sounds heard. Central nervous system: sleeping Extremities: Generalized weakness Skin: No rashes, lesions or ulcers Psychiatry: Demented, no agitation    Data Reviewed: I have personally reviewed following labs and imaging studies  CBC: Recent Labs  Lab 05/04/21 1000 05/07/21 1312  05/08/21 0350 05/09/21 0232  WBC 5.9 3.9* 4.2 4.1  NEUTROABS 4.5 2.6  --  2.4  HGB 12.5* 11.9* 12.7* 13.1  HCT 40.4 39.1 41.4 43.0  MCV 85.4 85.4 85.5 85.7  PLT 229 231 242 271    Basic Metabolic Panel: Recent Labs  Lab 05/04/21 1000 05/07/21 1312 05/08/21 0350 05/09/21 0232  NA 141 139 142 141  K 4.1 3.8 3.7 3.6  CL 106 104 107 107  CO2 GLUCOSE 133* 188* 116* 100*  BUN CREATININE 0.75 0.92  0.65 0.62  CALCIUM 9.1 8.7* 8.9 9.1  MG  --   --   --  2.1    GFR: Estimated Creatinine Clearance: 69.7 mL/min (by C-G formula based on SCr of 0.62 mg/dL).  Liver Function Tests: Recent Labs  Lab 05/04/21 1000 05/07/21 1312 05/08/21 0350  AST 33 35 34  ALT 39 30 29  ALKPHOS 51 49 54  BILITOT 0.3 0.4 0.4  PROT 5.9* 5.5* 5.7*  ALBUMIN 3.4* 3.0* 3.1*    CBG: No results for input(s): GLUCAP in the last 168 hours.   Recent Results (from the past 240 hour(s))  Culture, blood (Routine X 2) w Reflex to ID Panel     Status: None (Preliminary result)   Collection Time: 05/07/21  1:13 PM   Specimen: BLOOD  Result Value Ref Range Status   Specimen Description   Final    BLOOD RIGHT ANTECUBITAL Performed at [facility], [address] [place], [place] [address]    Special Requests   Final    BOTTLES DRAWN AEROBIC AND ANAEROBIC Blood Culture results may not be optimal due to an inadequate volume of blood received in culture bottles Performed at [facility], [address], [place], [place] [address]    Culture   Final    NO GROWTH 1 DAY Performed at [facility], [address] [place], [place] [address]    Report Status PENDING  Incomplete  Culture, blood (Routine X 2) w Reflex to ID Panel     Status: None (Preliminary result)   Collection Time: 05/07/21  1:14 PM   Specimen: BLOOD  Result Value Ref Range Status   Specimen Description   Final    BLOOD LEFT ANTECUBITAL Performed at [facility], [address] [place], [place] [address]    Special Requests   Final    BOTTLES DRAWN AEROBIC AND ANAEROBIC Blood Culture results may not be optimal due to an inadequate volume of blood received in culture bottles Performed at [facility], [address], [place], [place] [address]    Culture   Final    NO GROWTH 1 DAY Performed at [facility], [address], [place], [place] [address]    Report Status PENDING  Incomplete  Resp Panel by RT-PCR (Flu A&B, Covid) Nasopharyngeal Swab     Status: Abnormal   Collection Time: 05/07/21  1:37 PM   Specimen: Nasopharyngeal Swab; Nasopharyngeal(NP) swabs in vial transport medium  Result Value Ref Range Status   SARS Coronavirus 2 by RT PCR POSITIVE (A) NEGATIVE Final    Comment: (NOTE) SARS-CoV-2 target nucleic acids are DETECTED.  The SARS-CoV-2 RNA is generally detectable in upper respiratory specimens during the acute phase of infection. Positive results are indicative of the presence of the identified virus, but do not rule out bacterial infection or co-infection with other pathogens not detected by the test. Clinical correlation with patient history and other diagnostic information is necessary to determine patient infection status. The expected result is Negative.  Fact Sheet for Patients: BloggerCourse.com  Fact Sheet for Healthcare Providers: SeriousBroker.it  This test is not yet approved or cleared by the [place] FDA and  has been authorized for detection and/or diagnosis of SARS-CoV-2 by FDA under an Emergency Use Authorization (EUA).  This EUA will remain in effect (meaning this test can be used) for the duration of  the COVID-19 declaration under Section 564(b)(1) of the A ct,  21 U.S.C. section 360bbb-3(b)(1), unless the authorization is terminated or revoked sooner.     Influenza A by PCR NEGATIVE NEGATIVE Final    Influenza B by PCR NEGATIVE NEGATIVE Final    Comment: (NOTE) The Xpert Xpress SARS-CoV-2/FLU/RSV plus assay is intended as an aid in the diagnosis of influenza from Nasopharyngeal swab specimens and should not be used as a sole basis for treatment. Nasal washings and aspirates are unacceptable for Xpert Xpress SARS-CoV-2/FLU/RSV testing.  Fact Sheet for Patients: BloggerCourse.com  Fact Sheet for Healthcare Providers: SeriousBroker.it  This test is not yet approved or cleared by the [place] FDA and has been authorized for detection and/or diagnosis of SARS-CoV-2 by FDA under an Emergency Use Authorization (EUA). This EUA will remain in effect (meaning this test can be used) for the duration of the COVID-19 declaration under Section 564(b)(1) of the Act, 21 U.S.C. section 360bbb-3(b)(1), unless the authorization is terminated or revoked.  Performed at [facility], [address] [place] [address], [place], [place] [address]          Radiology Studies: CT Head Wo Contrast  Result Date: 05/07/2021 CLINICAL DATA:  Mental status change.  COVID positive patient. EXAM: CT HEAD WITHOUT CONTRAST TECHNIQUE: Contiguous axial images were obtained from the base of the skull through the vertex without intravenous contrast. COMPARISON:  Multiple prior exams, most recent head CT 3 days ago 05/04/2021 FINDINGS: Brain: Stable atrophy and chronic small vessel ischemia. No hemorrhage. No evidence of acute ischemia. Stable ventricular size and configuration. Dural based plaque-like calcification over lying the right frontotemporal lobe, unchanged from prior exams. No mass effect or midline shift. Vascular: No hyperdense vessel. Skull: No fracture or focal lesion. Sinuses/Orbits: Again seen mucosal thickening of ethmoid air cells and left frontal sinus. Mucous retention cyst left maxillary sinus. Other: None. IMPRESSION: 1. No acute intracranial  abnormality. 2. Stable atrophy and chronic small vessel ischemia. 3. 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 This is unchanged from multiple prior exams. Electronically Signed   By: Narda Rutherford M.D.   On: 05/07/2021 17:06   DG Chest Port 1 View  Result Date: 05/07/2021 CLINICAL DATA:  Altered mental status EXAM: PORTABLE CHEST 1 VIEW COMPARISON:  Chest x-rays dated 05/04/2021 and 04/09/2021. FINDINGS: Heart size and mediastinal contours are stable. Lungs are clear. No pleural effusion or pneumothorax is seen. Osseous structures about the chest are unremarkable. IMPRESSION: No active disease. No evidence of pneumonia or pulmonary edema. Electronically Signed   By: Bary Richard M.D.   On: 05/07/2021 14:20   ECHOCARDIOGRAM COMPLETE  Result Date: 05/08/2021    ECHOCARDIOGRAM REPORT   Patient Name:   CASSEY HURRELL Date of Exam: 05/08/2021 Medical Rec #:  [id number]        Height:       73.0 in Accession #:    [id number]       Weight:       157.4 lb Date of Birth:  May 27, 1935        BSA:          1.943 m Patient Age:    85 years         BP:           162/82 mmHg Patient Gender: M                HR:           55 bpm. Exam Location:  Inpatient Procedure: 2D Echo,  Cardiac Doppler and Color Doppler Indications:    R55 Syncope  History:        Patient has no prior history of Echocardiogram examinations.                 Risk Factors:Hypertension and Dyslipidemia. COVID-19 Positive.  Sonographer:    Elmarie Shiley Dance Referring Phys: [id number] Cecille Po MELVIN IMPRESSIONS  1. Left ventricular ejection fraction, by estimation, is 65 to 70%. The left ventricle has normal function. The left ventricle has no regional wall motion abnormalities. There is moderate asymmetric left ventricular hypertrophy of the septal segment. Left ventricular diastolic parameters are indeterminate.  2. Right ventricular systolic function is normal. The right  ventricular size is normal. There is normal pulmonary artery systolic pressure. The estimated right ventricular systolic pressure is 19.2 mmHg.  3. Early diastolic compression of RV free wall and RA without obvious respiratory variation in mitral outflow. Tamponade is not suggested, but effusion has potential for hemodynamic significance and should be followed. Consider limited echocardiogram in  the next 48-72 hours. Moderate pericardial effusion. The pericardial effusion is circumferential, but largest anteriorly.  4. The mitral valve is grossly normal. Trivial mitral valve regurgitation.  5. The aortic valve is tricuspid. Aortic valve regurgitation is mild. Mild to moderate aortic valve sclerosis/calcification is present, without any evidence of aortic stenosis. Aortic regurgitation PHT measures 620 msec.  6. Aortic dilatation noted. There is mild dilatation of the ascending aorta, measuring 42 mm.  7. The inferior vena cava is normal in size with greater than 50% respiratory variability, suggesting right atrial pressure of 3 mmHg. FINDINGS  Left Ventricle: Left ventricular ejection fraction, by estimation, is 65 to 70%. The left ventricle has normal function. The left ventricle has no regional wall motion abnormalities. The left ventricular internal cavity size was normal in size. There is  moderate asymmetric left ventricular hypertrophy of the septal segment. Left ventricular diastolic parameters are indeterminate. Right Ventricle: The right ventricular size is normal. No increase in right ventricular wall thickness. Right ventricular systolic function is normal. There is normal pulmonary artery systolic pressure. The tricuspid regurgitant velocity is 2.01 m/s, and  with an assumed right atrial pressure of 3 mmHg, the estimated right ventricular systolic pressure is 19.2 mmHg. Left Atrium: Left atrial size was normal in size. Right Atrium: Right atrial size was normal in size. Pericardium: Early diastolic  compression of RV free wall and RA without obvious respiratory variation in mitral outflow. Tamponade is not suggested, but effusion has potential for hemodynamic significance and should be followed. Consider limited echocardiogram in the next 48-72 hours. A moderately sized pericardial effusion is present. The pericardial effusion is circumferential. Mitral Valve: The mitral valve is grossly normal. Trivial mitral valve regurgitation. Tricuspid Valve: The tricuspid valve is grossly normal. Tricuspid valve regurgitation is trivial. Aortic Valve: The aortic valve is tricuspid. There is mild aortic valve annular calcification. Aortic valve regurgitation is mild. Aortic regurgitation PHT measures 620 msec. Mild to moderate aortic valve sclerosis/calcification is present, without any evidence of aortic stenosis. Pulmonic Valve: The pulmonic valve was grossly normal. Pulmonic valve regurgitation is trivial. Aorta: Aortic dilatation noted. There is mild dilatation of the ascending aorta, measuring 42 mm. Venous: The inferior vena cava is normal in size with greater than 50% respiratory variability, suggesting right atrial pressure of 3 mmHg. IAS/Shunts: No atrial level shunt detected by color flow Doppler.  LEFT VENTRICLE PLAX 2D LVIDd:         4.50 cm Diastology LVIDs:  2.40 cm LV e' medial:    4.03 cm/s LV PW:         1.00 cm LV E/e' medial:  17.0 LV IVS:        1.60 cm LV e' lateral:   5.55 cm/s                        LV E/e' lateral: 12.4  RIGHT VENTRICLE             IVC RV Basal diam:  2.90 cm     IVC diam: 1.70 cm RV S prime:     14.10 cm/s TAPSE (M-mode): 1.8 cm LEFT ATRIUM             Index       RIGHT ATRIUM           Index LA diam:        3.50 cm 1.80 cm/m  RA Area:     11.20 cm LA Vol (A2C):   52.4 ml 26.96 ml/m RA Volume:   20.70 ml  10.65 ml/m LA Vol (A4C):   31.9 ml 16.41 ml/m LA Biplane Vol: 41.6 ml 21.41 ml/m  AORTIC VALVE LVOT Vmax:   99.10 cm/s LVOT Vmean:  64.200 cm/s LVOT VTI:    0.209 m  AI PHT:      620 msec  AORTA Ao Asc diam: 4.20 cm MITRAL VALVE               TRICUSPID VALVE MV Area (PHT): 2.75 cm    TR Peak grad:   16.2 mmHg MV Decel Time: 276 msec    TR Vmax:        201.00 cm/s MV E velocity: 68.60 cm/s MV A velocity: 74.70 cm/s  SHUNTS MV E/A ratio:  0.92        Systemic VTI: 0.21 m Nona Dell MD Electronically signed by Nona Dell MD Signature Date/Time: 05/08/2021/12:56:36 PM    Final    VAS [place] LOWER EXTREMITY VENOUS (DVT)  Result Date: 05/08/2021  Lower Venous DVT Study Patient Name:  HIKARU DELORENZO  Date of Exam:   05/08/2021 Medical Rec #: [id number]         Accession #:    [id number] Date of Birth: 03-03-1935         Patient Gender: M Patient Age:   85Y Exam Location:  [facility] Procedure:      VAS [place] LOWER EXTREMITY VENOUS (DVT) Referring Phys: [id number] Harla Mensch --------------------------------------------------------------------------------  Indications: Edema, and COVID+.  Comparison Study: No previous exams Performing Technologist: Ernestene Mention  Examination Guidelines: A complete evaluation includes B-mode imaging, spectral Doppler, color Doppler, and power Doppler as needed of all accessible portions of each vessel. Bilateral testing is considered an integral part of a complete examination. Limited examinations for reoccurring indications may be performed as noted. The reflux portion of the exam is performed with the patient in reverse Trendelenburg.  +---------+---------------+---------+-----------+----------+--------------+ RIGHT    CompressibilityPhasicitySpontaneityPropertiesThrombus Aging +---------+---------------+---------+-----------+----------+--------------+ CFV      Full           Yes      Yes                                 +---------+---------------+---------+-----------+----------+--------------+ SFJ      Full                                                         +---------+---------------+---------+-----------+----------+--------------+  FV Prox  Full           Yes      Yes                                 +---------+---------------+---------+-----------+----------+--------------+ FV Mid   Full           Yes      Yes                                 +---------+---------------+---------+-----------+----------+--------------+ FV DistalFull           Yes      Yes                                 +---------+---------------+---------+-----------+----------+--------------+ PFV      Full                                                        +---------+---------------+---------+-----------+----------+--------------+ POP      Full           Yes      Yes                                 +---------+---------------+---------+-----------+----------+--------------+ PTV      Full                                                        +---------+---------------+---------+-----------+----------+--------------+ PERO     Full                                                        +---------+---------------+---------+-----------+----------+--------------+   +---------+---------------+---------+-----------+----------+--------------+ LEFT     CompressibilityPhasicitySpontaneityPropertiesThrombus Aging +---------+---------------+---------+-----------+----------+--------------+ CFV      Full           Yes      Yes                                 +---------+---------------+---------+-----------+----------+--------------+ SFJ      Full                                                        +---------+---------------+---------+-----------+----------+--------------+ FV Prox  Full           Yes      Yes                                 +---------+---------------+---------+-----------+----------+--------------+ FV Mid   Full  Yes      Yes                                  +---------+---------------+---------+-----------+----------+--------------+ FV DistalFull           Yes      Yes                                 +---------+---------------+---------+-----------+----------+--------------+ PFV      Full                                                        +---------+---------------+---------+-----------+----------+--------------+ POP      Full           Yes      Yes                                 +---------+---------------+---------+-----------+----------+--------------+ PTV      Full                                                        +---------+---------------+---------+-----------+----------+--------------+ PERO     Full                                                        +---------+---------------+---------+-----------+----------+--------------+     Summary: BILATERAL: - No evidence of deep vein thrombosis seen in the lower extremities, bilaterally. - RIGHT: - A cystic structure is found in the popliteal fossa.  LEFT: - A cystic structure is found in the popliteal fossa.  *See table(s) above for measurements and observations. Electronically signed by Lemar LivingsBrandon Cain MD on 05/08/2021 at 11:05:11 AM.    Final         Scheduled Meds: . divalproex  1,000 mg Oral QHS  . donepezil  20 mg Oral QHS  . enoxaparin (LOVENOX) injection  40 mg Subcutaneous Q24H  . levETIRAcetam  500 mg Oral BID  . memantine  10 mg Oral BID  . risperiDONE  0.5 mg Oral QHS  . tamsulosin  0.4 mg Oral Daily   Continuous Infusions: . sodium chloride 100 mL/hr at 05/09/21 0714     LOS: 0 days   Time spent: 35mins, case discussed with cardiology and palliative care Greater than 50% of this time was spent in counseling, explanation of diagnosis, planning of further management, and coordination of care.   Voice Recognition Reubin Milan/Dragon dictation system was used to create this note, attempts have been made to correct errors. Please contact the author with  questions and/or clarifications.   Albertine GratesFang Omnia Dollinger, MD PhD FACP [facility]  Available via Epic secure chat 7am-7pm for nonurgent issues Please page for urgent issues To page the attending provider between 7A-7P or the covering provider during after hours 7P-7A, please log into the web site [email or web address] and access  using universal [place] password for that web site. If you do not have the password, please call the hospital operator.    05/09/2021, 9:10 AM

## 2021-05-09 NOTE — Consult Note (Signed)
Consultation Note Date: 05/09/2021   Patient Name: [name] [name]  DOB: 1935-12-08  MRN: [id number]  Age / Sex: 86 y.o., male  PCP: [name], MD Referring Physician: Albertine Grates, MD  Reason for Consultation: Establishing goals of care  HPI/Patient Profile: 86 y.o. male admitted on 05/07/2021   86 year old man with history of dementia, questionable seizure disorder presenting with progressive failure to thrive and an episode of unresponsiveness at home.  COVID returned positive. CXR clear.  In AM on 05-09-21 became more hypotensive and hypothermic and was moved to the stepdown unit.  Patient has been seen and evaluated by critical care medicine as well as cardiology.  Repeat surface echocardiogram has also been ordered.  Patient's initial echo with moderate effusion.   Clinical Assessment and Goals of Care: Palliative consultation has been requested for CODE STATUS and goals of care discussions.  Patient is resting in the bed has a bear hugger on.  Discussed with bedside RN.  Call placed and introduced myself to his wife: Palliative medicine is specialized medical care for people living with serious illness. It focuses on providing relief from the symptoms and stress of a serious illness. The goal is to improve quality of life for both the patient and the family.  Goals of care: Broad aims of medical therapy in relation to the patient's values and preferences. Our aim is to provide medical care aimed at enabling patients to achieve the goals that matter most to them, given the circumstances of their particular medical situation and their constraints.   Patient's wife states that she has been staying by the telephone all day.  Additionally she has access to MyChart and has been trying to read medical documentation.  She asks why the patient has fluid around his heart.  She asks about purpose of palliative  care.  Brief life review performed.  Goals wishes and values important to the patient attempted to be explored via the patient's wife.  Patient's wife states that he was talking and alert and interactive up until yesterday.  She states that unfortunately he has had even more of a decline since he went to rehab.  She asks about his COVID diagnosis and whether proper quarantining was followed or not.  She has several questions with regards to the patient's ongoing decline.  Further history was obtained from her.  Patient has been having recurrent falls which required her to contact EMS on a frequent basis to help at home.  Advanced directives were discussed.  Patient's wife states that they completed a general living will several years ago.  Patient is a general form of documentation stating that if there is brain death, they would not want to continue with artificial support.  I discussed with her about CODE STATUS.  She endorses continuation of full CODE STATUS.  Patient has 2 children a son and a daughter.  Son lives in [place], wife states that when she told the patient's son that palliative services has been called, son stated that  he would be on his way and would be [place] soon.  Wife states that her and the entire family are very much worried about the patient's overall condition and are hoping for stabilization/recovery.  NEXT OF KIN Wife, son and daughter.  SUMMARY OF RECOMMENDATIONS   Full code, full scope for now.  Ongoing gentle discussions with the patient's wife about chronic disease burden that the patient has a from history of dementia, history of recurrent falls and ongoing functional decline. Thank you for the consult.  Code Status/Advance Care Planning:  Full code    Symptom Management:      Palliative Prophylaxis:   Delirium Protocol  Additional Recommendations (Limitations, Scope, Preferences):  Full Scope Treatment  Psycho-social/Spiritual:   Desire  for further Chaplaincy support:yes  Additional Recommendations: Caregiving  Support/Resources  Prognosis:   Unable to determine  Discharge Planning: To Be Determined      Primary Diagnoses: Present on Admission: . Syncope . BPH with urinary obstruction . Dementia (HCC) . Dyslipidemia . Essential hypertension   I have reviewed the medical record, interviewed the patient and family, and examined the patient. The following aspects are pertinent.  Past Medical History:  Diagnosis Date  . Allergy   . Dementia [facility](HCC)    sees Dr. Patrcia DollyKaren Aquino   . Diverticulosis of colon (without mention of hemorrhage) 03-01-2004, 04-04-2011   Colonoscopy  . ED (erectile dysfunction)   . Glaucoma    sees Dr. Arvil Chacooy Whittaker   . Hyperglycemia   . Hyperlipidemia   . Hypertension   . Internal hemorrhoids 03-15-1999   Flex Sig   . Irritable bowel    Social History   Socioeconomic History  . Marital status: Married    Spouse name: Not on file  . Number of children: 2  . Years of education: 4911  . Highest education level: Not on file  Occupational History  . Occupation: retired    Associate Professormployer: RETIRED  Tobacco Use  . Smoking status: Former Smoker    Quit date: 03/20/1969    Years since quitting: 52.1  . Smokeless tobacco: Never Used  Vaping Use  . Vaping Use: Never used  Substance and Sexual Activity  . Alcohol use: Yes    Alcohol/week: 0.0 standard drinks    Comment: occ  . Drug use: No  . Sexual activity: Not on file  Other Topics Concern  . Not on file  Social History Narrative   Right handed   Drinks caffeine   One story home   Social Determinants of Health   Financial Resource Strain: Not on file  Food Insecurity: Not on file  Transportation Needs: Not on file  Physical Activity: Not on file  Stress: Not on file  Social Connections: Not on file   Family History  Problem Relation Age of Onset  . Hypertension Mother   . Lung cancer Father   . Colitis Daughter    Scheduled  Meds: . Chlorhexidine Gluconate Cloth  6 each Topical Daily  . [START ON 05/10/2021] cosyntropin  0.25 mg Intravenous Once  . divalproex  1,000 mg Oral QHS  . donepezil  20 mg Oral QHS  . enoxaparin (LOVENOX) injection  40 mg Subcutaneous Q24H  . levETIRAcetam  500 mg Oral BID  . mouth rinse  15 mL Mouth Rinse BID  . memantine  10 mg Oral BID  . midodrine  10 mg Oral TID WC  . risperiDONE  0.5 mg Oral QHS  . sodium chloride (PF)  Continuous Infusions: . lactated ringers 100 mL/hr at 05/09/21 1511   PRN Meds:.acetaminophen **OR** acetaminophen, polyethylene glycol Medications Prior to Admission:  Prior to Admission medications   Medication Sig Start Date End Date Taking? Authorizing Provider  AZOPT 1 % ophthalmic suspension Place 1 drop into both eyes 3 (three) times daily. 12/18/19  Yes [provider]  bimatoprost (LUMIGAN) 0.01 % SOLN Place 1 drop into both eyes at bedtime.   Yes [provider]  cetirizine (ZYRTEC) 10 MG tablet Take 10 mg by mouth daily as needed for allergies.   Yes [provider]  Coenzyme Q10 (CO Q 10 PO) Take 1 capsule by mouth daily.   Yes [provider]  diltiazem (DILTIAZEM CD) 120 MG 24 hr capsule Take 1 capsule (120 mg total) by mouth daily. 04/07/21  Yes [name], MD  divalproex (DEPAKOTE ER) 500 MG 24 hr tablet Take 1 tablet every night Patient taking differently: Take 500 mg by mouth daily. 03/29/21  Yes Van Clines, MD  donepezil (ARICEPT) 10 MG tablet Take 20 mg by mouth at bedtime. 03/06/21  Yes [provider]  fish oil-omega-3 fatty acids 1000 MG capsule Take 1 g by mouth daily.   Yes [provider]  Glucosamine-Chondroitin 500-400 MG CAPS Take 1 tablet by mouth daily.   Yes [provider]  levETIRAcetam (KEPPRA) 500 MG tablet TAKE 1 TABLET (500 MG TOTAL) BY MOUTH TWO TIMES DAILY. Patient taking differently: Take 500 mg by mouth 2 (two) times daily. 03/11/21 03/11/22 Yes  [name], MD  melatonin 5 MG TABS Take 5 mg by mouth at bedtime.   Yes [provider]  memantine (NAMENDA) 10 MG tablet Take 1 tablet twice a day Patient taking differently: Take 10 mg by mouth 2 (two) times daily. 03/22/21  Yes Van Clines, MD  MILK THISTLE PO Take 1 capsule by mouth daily.   Yes [provider]  Multiple Vitamins-Minerals (MULTIVITAMIN WITH MINERALS) tablet Take 1 tablet by mouth daily.   Yes [provider]  polyethylene glycol (MIRALAX / GLYCOLAX) 17 g packet Take 17 g by mouth daily as needed for moderate constipation.   Yes [provider]  RHOPRESSA 0.02 % SOLN Place 1 drop into both eyes at bedtime. 08/17/19  Yes [provider]  risperiDONE (RISPERDAL) 0.5 MG tablet Take 1 tablet (0.5 mg total) by mouth at bedtime. 02/25/20  Yes [name], MD  tamsulosin (FLOMAX) 0.4 MG CAPS capsule Take 1 capsule (0.4 mg total) by mouth daily. 02/25/21  Yes [name], MD  terazosin (HYTRIN) 2 MG capsule Take 2 capsules (4 mg total) by mouth at bedtime. 02/25/21  Yes [name], MD   Allergies  Allergen Reactions  . Chocolate     Itching if he eats too much chocolate   . Ciprofloxacin     Achilles tendon pain   . Lipitor [Atorvastatin]     Muscle aches    Review of Systems Nonverbal Physical Exam COVID precautions Observed from stepdown unit Currently on Bair hugger Appears to have regular work of breathing Chart reviewed, medication history reviewed, vitals reviewed and discussed with bedside RN. Vital Signs: BP (!) 109/51   Pulse (!) 54   Temp 97.7 F (36.5 C) (Axillary)   Resp 15   Wt 70.4 kg   SpO2 98%   BMI 20.48 kg/m  Pain Scale: 0-10   Pain Score: Asleep   SpO2: SpO2: 98 % O2 Device:SpO2:  98 % O2 Flow Rate: .   IO: Intake/output summary:   Intake/Output Summary (Last 24 hours) at 05/09/2021 1708 Last data filed at 05/09/2021 1511 Gross per 24 hour  Intake 3742.91 ml  Output 1300 ml   Net 2442.91 ml    LBM: Last BM Date: 05/09/21 Baseline Weight: Weight: 71.4 kg (bedscale) Most recent weight: Weight: 70.4 kg     Palliative Assessment/Data:   Palliative performance scale 40%.  Time In: 1600 Time Out: 1700 Time Total: 60 Greater than 50%  of this time was spent counseling and coordinating care related to the above assessment and plan.  Signed by: Rosalin Hawking, MD   Please contact Palliative Medicine Team phone at [phone] for questions and concerns.  For individual provider: See Loretha Stapler

## 2021-05-10 DIAGNOSIS — R55 Syncope and collapse: Secondary | ICD-10-CM | POA: Diagnosis not present

## 2021-05-10 LAB — CBC WITH DIFFERENTIAL/PLATELET
Abs Immature Granulocytes: 0.04 10*3/uL (ref 0.00–0.07)
Basophils Absolute: 0 10*3/uL (ref 0.0–0.1)
Basophils Relative: 1 %
Eosinophils Absolute: 0.3 10*3/uL (ref 0.0–0.5)
Eosinophils Relative: 8 %
HCT: 40.7 % (ref 39.0–52.0)
Hemoglobin: 12.5 g/dL — ABNORMAL LOW (ref 13.0–17.0)
Immature Granulocytes: 1 %
Lymphocytes Relative: 14 %
Lymphs Abs: 0.6 10*3/uL — ABNORMAL LOW (ref 0.7–4.0)
MCH: 26.2 pg (ref 26.0–34.0)
MCHC: 30.7 g/dL (ref 30.0–36.0)
MCV: 85.1 fL (ref 80.0–100.0)
Monocytes Absolute: 0.8 10*3/uL (ref 0.1–1.0)
Monocytes Relative: 19 %
Neutro Abs: 2.3 10*3/uL (ref 1.7–7.7)
Neutrophils Relative %: 57 %
Platelets: 240 10*3/uL (ref 150–400)
RBC: 4.78 MIL/uL (ref 4.22–5.81)
RDW: 14 % (ref 11.5–15.5)
WBC: 4 10*3/uL (ref 4.0–10.5)
nRBC: 0 % (ref 0.0–0.2)

## 2021-05-10 LAB — COMPREHENSIVE METABOLIC PANEL
ALT: 24 U/L (ref 0–44)
AST: 29 U/L (ref 15–41)
Albumin: 2.9 g/dL — ABNORMAL LOW (ref 3.5–5.0)
Alkaline Phosphatase: 55 U/L (ref 38–126)
Anion gap: 5 (ref 5–15)
BUN: 9 mg/dL (ref 8–23)
CO2: 27 mmol/L (ref 22–32)
Calcium: 8.6 mg/dL — ABNORMAL LOW (ref 8.9–10.3)
Chloride: 107 mmol/L (ref 98–111)
Creatinine, Ser: 0.73 mg/dL (ref 0.61–1.24)
GFR, Estimated: >60 mL/min (ref >60)
Glucose, Bld: 80 mg/dL (ref 70–99)
Potassium: 3.7 mmol/L (ref 3.5–5.1)
Sodium: 139 mmol/L (ref 135–145)
Total Bilirubin: 0.7 mg/dL (ref 0.3–1.2)
Total Protein: 5.4 g/dL — ABNORMAL LOW (ref 6.5–8.1)

## 2021-05-10 LAB — ACTH STIMULATION, 3 TIME POINTS
Cortisol, 30 Min: 24.2 ug/dL
Cortisol, 60 Min: 29.6 ug/dL
Cortisol, Base: 22.1 ug/dL

## 2021-05-10 LAB — CORTISOL: Cortisol, Plasma: 24.7 ug/dL

## 2021-05-10 LAB — LEVETIRACETAM LEVEL: Levetiracetam Lvl: 7.3 ug/mL — ABNORMAL LOW (ref 10.0–40.0)

## 2021-05-10 LAB — MAGNESIUM: Magnesium: 1.9 mg/dL (ref 1.7–2.4)

## 2021-05-10 MED ORDER — LEVETIRACETAM 100 MG/ML PO SOLN
500.0000 mg | Freq: Two times a day (BID) | ORAL | Status: DC
  Administered 2021-05-10 – 2021-05-12 (×5): 500 mg via ORAL
  Filled 2021-05-10 (×5): qty 5

## 2021-05-10 MED ORDER — MIDODRINE HCL 5 MG PO TABS
5.0000 mg | ORAL_TABLET | Freq: Three times a day (TID) | ORAL | Status: DC
  Administered 2021-05-10 – 2021-05-12 (×7): 5 mg via ORAL
  Filled 2021-05-10 (×7): qty 1

## 2021-05-10 NOTE — Progress Notes (Signed)
Cardiology Progress Note  Patient ID: [name] [name] MRN: [id number] DOB: 1935-04-25 Date of Encounter: 05/10/2021  Primary Cardiologist: Olga Millers, MD  Subjective   Chief Complaint: None.  HPI: Hypotension has resolved with intravenous fluids.  Denies any symptoms.  ROS:  All other ROS reviewed and negative. Pertinent positives noted in the HPI.     Inpatient Medications  Scheduled Meds: . Chlorhexidine Gluconate Cloth  6 each Topical Daily  . divalproex  1,000 mg Oral QHS  . donepezil  20 mg Oral QHS  . enoxaparin (LOVENOX) injection  40 mg Subcutaneous Q24H  . levETIRAcetam  500 mg Oral BID  . mouth rinse  15 mL Mouth Rinse BID  . memantine  10 mg Oral BID  . midodrine  5 mg Oral TID WC  . risperiDONE  0.5 mg Oral QHS   Continuous Infusions: . lactated ringers 50 mL/hr at 05/10/21 1204   PRN Meds: acetaminophen **OR** acetaminophen, polyethylene glycol   Vital Signs   Vitals:   05/10/21 0900 05/10/21 1000 05/10/21 1100 05/10/21 1200  BP: (!) 157/93 116/63 (!) 91/58 122/68  Pulse: 72 62 (!) 59 (!) 57  Resp: 16 12 14 12   Temp:    (!) 97.5 F (36.4 C)  TempSrc:    Oral  SpO2: 100% 100% 97% 98%  Weight:        Intake/Output Summary (Last 24 hours) at 05/10/2021 1218 Last data filed at 05/10/2021 1204 Gross per 24 hour  Intake 3904.94 ml  Output 800 ml  Net 3104.94 ml   Last 3 Weights 05/09/2021 05/09/2021 05/08/2021  Weight (lbs) 167 lb 13.4 oz 155 lb 3.3 oz 157 lb 6.4 oz  Weight (kg) 76.13 kg 70.4 kg 71.396 kg      Telemetry  Overnight telemetry shows sinus rhythm in the 60s, which I personally reviewed.   Physical Exam   Vitals:   05/10/21 0900 05/10/21 1000 05/10/21 1100 05/10/21 1200  BP: (!) 157/93 116/63 (!) 91/58 122/68  Pulse: 72 62 (!) 59 (!) 57  Resp: 16 12 14 12   Temp:    (!) 97.5 F (36.4 C)  TempSrc:    Oral  SpO2: 100% 100% 97% 98%  Weight:         Intake/Output Summary (Last 24 hours) at 05/10/2021 1218 Last data filed at  05/10/2021 1204 Gross per 24 hour  Intake 3904.94 ml  Output 800 ml  Net 3104.94 ml    Last 3 Weights 05/09/2021 05/09/2021 05/08/2021  Weight (lbs) 167 lb 13.4 oz 155 lb 3.3 oz 157 lb 6.4 oz  Weight (kg) 76.13 kg 70.4 kg 71.396 kg    Body mass index is 22.14 kg/m.  General: Well nourished, well developed, in no acute distress Head: Atraumatic, normal size  Eyes: PEERLA, EOMI  Neck: Supple, no JVD Endocrine: No thryomegaly Cardiac: Normal S1, S2; RRR; no murmurs, rubs, or gallops Lungs: Clear to auscultation bilaterally, no wheezing, rhonchi or rales  Abd: Soft, nontender, no hepatomegaly  Ext: No edema, pulses 2+ Musculoskeletal: No deformities, BUE and BLE strength normal and equal Skin: Warm and dry, no rashes   Neuro: Alert, awake, oriented to person only  Labs  High Sensitivity Troponin:   Recent Labs  Lab 05/04/21 1000 05/04/21 1202  TROPONINIHS 15 17     Cardiac EnzymesNo results for input(s): TROPONINI in the last 168 hours. No results for input(s): TROPIPOC in the last 168 hours.  Chemistry Recent Labs  Lab 05/07/21 1312 05/08/21 0350 05/09/21 0232  05/10/21 0720  NA 139 142 141 139  K 3.8 3.7 3.6 3.7  CL 104 107 107 107  CO2 29 30 27 27   GLUCOSE 188* 116* 100* 80  BUN 15 9 9 9   CREATININE 0.92 0.65 0.62 0.73  CALCIUM 8.7* 8.9 9.1 8.6*  PROT 5.5* 5.7*  --  5.4*  ALBUMIN 3.0* 3.1*  --  2.9*  AST 35 34  --  29  ALT 30 29  --  24  ALKPHOS 49 54  --  55  BILITOT 0.4 0.4  --  0.7  GFRNONAA >60 >60 >60 >60  ANIONGAP 6 5 7 5     Hematology Recent Labs  Lab 05/08/21 0350 05/09/21 0232 05/10/21 0720  WBC 4.2 4.1 4.0  RBC 4.84 5.02 4.78  HGB 12.7* 13.1 12.5*  HCT 41.4 43.0 40.7  MCV 85.5 85.7 85.1  MCH 26.2 26.1 26.2  MCHC 30.7 30.5 30.7  RDW 13.8 13.7 14.0  PLT 242 271 240   BNP Recent Labs  Lab 05/09/21 0232  BNP 114.1*    DDimer  Recent Labs  Lab 05/08/21 0952 05/09/21 0232  DDIMER 1.01* 0.87*     Radiology  ECHOCARDIOGRAM  LIMITED  Result Date: 05/09/2021    ECHOCARDIOGRAM LIMITED REPORT   Patient Name:   [name] [name] Date of Exam: 05/09/2021 Medical Rec #:  05/11/2021        Height:       73.0 in Accession #:    [name]       Weight:       155.2 lb Date of Birth:  1935/10/13        BSA:          1.932 m Patient Age:    85 years         BP:           102/67 mmHg Patient Gender: M                HR:           610 bpm. Exam Location:  Inpatient Procedure: Limited Echo, Color Doppler and Cardiac Doppler STAT ECHO Indications:    Pericardial effusion I31.3  History:        Patient has prior history of Echocardiogram examinations, most                 recent 05/08/2021. Hypertension and Dyslipidemia. COVID-19.                 Dementia.  Sonographer:    [id number] RDCS Referring Phys: 09/13/1935 FANG XU IMPRESSIONS  1. Limited study to fu pericardial effusion; full doppler not performed; moderate pericardial effusion with mild RA collapse; IVC upper normal but collapses; no diagnostic flow variation across MV; suggest FU echos and clinical correlation.  2. Left ventricular ejection fraction, by estimation, is 55 to 60%. The left ventricle has normal function.  3. Right ventricular systolic function is normal. The right ventricular size is normal.  4. Moderate pericardial effusion.  5. Aortic dilatation noted. There is mild dilatation of the ascending aorta, measuring 42 mm. FINDINGS  Left Ventricle: Left ventricular ejection fraction, by estimation, is 55 to 60%. The left ventricle has normal function. Right Ventricle: The right ventricular size is normal. Right ventricular systolic function is normal. Left Atrium: Left atrial size was normal in size. Right Atrium: Right atrial size was normal in size. Pericardium: A moderately sized pericardial effusion is present. Aorta: Aortic dilatation noted. There  is mild dilatation of the ascending aorta, measuring 42 mm. Additional Comments: Limited study to fu pericardial effusion; full  doppler not performed; moderate pericardial effusion with mild RA collapse; IVC upper normal but collapses; no diagnostic flow variation across MV; suggest FU echos and clinical correlation. LEFT VENTRICLE PLAX 2D LVOT diam:     2.00 cm LV SV:         58 LV SV Index:   30 LVOT Area:     3.14 cm                         3D Volume EF:                         3D EF:        57 %                         LV EDV:       88 ml                         LV ESV:       38 ml                         LV SV:        50 ml AORTIC VALVE LVOT Vmax:   94.20 cm/s LVOT Vmean:  57.900 cm/s LVOT VTI:    0.185 m  SHUNTS Systemic VTI:  0.18 m Systemic Diam: 2.00 cm Olga MillersBrian Crenshaw MD Electronically signed by Olga MillersBrian Crenshaw MD Signature Date/Time: 05/09/2021/12:31:56 PM    Final     Cardiac Studies  TTE 05/09/2021  1. Limited study to fu pericardial effusion; full doppler not performed;  moderate pericardial effusion with mild RA collapse; IVC upper normal but  collapses; no diagnostic flow variation across MV; suggest FU echos and  clinical correlation.  2. Left ventricular ejection fraction, by estimation, is 55 to 60%. The  left ventricle has normal function.  3. Right ventricular systolic function is normal. The right ventricular  size is normal.  4. Moderate pericardial effusion.  5. Aortic dilatation noted. There is mild dilatation of the ascending  aorta, measuring 42 mm.   Patient Profile  [name] BenceJoe L [name] is a 86 y.o. male with dementia, hypertension, hyperlipidemia who was admitted on 05/09/2021 with syncope.  Cardiology consulted for pericardial effusion.  Assessment & Plan   1.  Pericardial effusion, moderate -Normal thyroid studies.  No evidence of tamponade clinically or on his echocardiogram.  This is an incidental finding. -This does not explain his hypotension or symptoms.  No indication for drainage. -He can have a repeat echocardiogram in 2 to 4 weeks.  2.  Unresponsiveness/recurrent  hypotension -Possibly related to dehydration and hypertensive medications.  Concerns for dysautonomia. -Cortisol levels normal.  Thyroid studies normal.  Moderate pericardial effusion does not explain this.  He has normal LV function.  [facility] will sign off.   Medication Recommendations: Continue home antihypertensive agents.  Continue midodrine. Other recommendations (labs, testing, etc): None Follow up as an outpatient:  Please notify us once he is closer to discharge.  We will arrange hospital follow-up and he can have a repeat echocardiogram as an outpatient.  For questions or updates, please contact [facility] Please consult [email or web address] for contact info under   Time Spent with Patient: I  have spent a total of 15 minutes with patient reviewing hospital notes, telemetry, EKGs, labs and examining the patient as well as establishing an assessment and plan that was discussed with the patient.  > 50% of time was spent in direct patient care.    Signed, Lenna Gilford. Flora Lipps, MD, [facility]  [facility]  05/10/2021 12:18 PM

## 2021-05-10 NOTE — TOC Initial Note (Addendum)
Transition of Care [facility]) - Initial/Assessment Note    Patient Details  Name: [name] [name] MRN: [id number] Date of Birth: [birth year]  [facility]) CM/SW Contact:    Golda Acre, RN Phone Number: 05/10/2021, 3:02 PM  Clinical Narrative:                 Text to Kathlene November with [facility] for PT and OT at home. Unab le to take due  [facility] to [facility] will take and service.  Expected Discharge Plan: Home w Home Health Services     Patient Goals and CMS Choice        Expected Discharge Plan and Services Expected Discharge Plan: Home w Home Health Services                                   HH Arranged: PT,OT HH Agency: [facility] Date HH Agency Contacted: 05/10/21 Time HH Agency Contacted: 1502 Representative spoke with at [facility] Agency: mike  Prior Living Arrangements/Services       Do you feel safe going back to the place where you live?: Yes               Activities of Daily Living Home Assistive Devices/Equipment: Environmental consultant (specify type) ADL Screening (condition at time of admission) Patient's cognitive ability adequate to safely complete daily activities?: No Is the patient deaf or have difficulty hearing?: No Does the patient have difficulty seeing, even when wearing glasses/contacts?: No Does the patient have difficulty concentrating, remembering, or making decisions?: Yes Patient able to express need for assistance with ADLs?: Yes Does the patient have difficulty dressing or bathing?: Yes Independently performs ADLs?: No Communication: Independent Dressing (OT): Needs assistance Is this a change from baseline?: Pre-admission baseline Grooming: Needs assistance Is this a change from baseline?: Pre-admission baseline Feeding: Independent Bathing: Needs assistance Is this a change from baseline?: Pre-admission baseline Toileting: Needs assistance Is this a change from baseline?: Pre-admission  baseline In/Out Bed: Needs assistance Is this a change from baseline?: Pre-admission baseline Walks in Home: Needs assistance Is this a change from baseline?: Pre-admission baseline Does the patient have difficulty walking or climbing stairs?: Yes Weakness of Legs: Both Weakness of Arms/Hands: Both  Permission Sought/Granted                  Emotional Assessment              Admission diagnosis:  Seizure (HCC) [R56.9] Syncope [R55] Hypothermia, initial encounter [T68.XXXA] Fall, initial encounter [id number]] Syncope, unspecified syncope type [R55] Dementia without behavioral disturbance, unspecified dementia type (HCC) [F03.90] Hematuria, unspecified type [R31.9] COVID [U07.1] Patient Active Problem List   Diagnosis Date Noted  . Pericardial effusion, acute   . COVID   . Palliative care by specialist   . Goals of care, counseling/discussion   . General weakness   . Syncope 05/07/2021  . Seizure (HCC) 03/09/2021  . Dementia (HCC) 02/25/2021  . BPH with urinary obstruction 12/22/2020  . Ankle edema, bilateral 07/13/2020  . Hallucinations 02/09/2020  . Abdominal pain, acute 08/21/2014  . GI bleeding 05/26/2012  . Renal mass 05/26/2012  . Diverticulosis of colon with hemorrhage 05/26/2012  . SHOULDER PAIN, BILATERAL 07/30/2010  . HERPES SIMPLEX INFECTION 10/10/2007  . TACHYCARDIA, PAROXYSMAL NOS 10/10/2007  . Dyslipidemia 07/11/2007  . Essential hypertension 07/11/2007  . ALLERGIC RHINITIS 07/11/2007   PCP:  Clent Ridges,  Tera Mater, MD Pharmacy:   [facility] [address], [place] - [address] [place] [address] Phone: [phone] Fax: [phone]     Social Determinants of Health (SDOH) Interventions    Readmission Risk Interventions No flowsheet data found.

## 2021-05-10 NOTE — TOC Progression Note (Addendum)
Transition of Care [facility]) - Progression Note    Patient Details  Name: JAYLEND REILAND MRN: [id number] Date of Birth: Aug 13, 1935  [facility] CM/SW Contact  Golda Acre, RN Phone Number: 05/10/2021, 10:00 AM  Clinical Narrative:    Significant Hospital Events: Including procedures, antibiotic start and stop dates in addition to other pertinent events     5/14 admitted  5/15 PCCM consulted. Started midodrine. Encouraged hydration. Cards consulted for pericardial effusion. LV fxn was nml. There was mild RV collapse BUT IVC was not dilated & no evidence of clinical tamponade. At time of cards consult actually Hypertensive. Palliative consulted. Goals of care discussion initiated. Family noted decline at home. Freq falls at home. Wife stated re: living will pt would want all interventions unless brain death  PLAN: following for toc needs and progression hhc needs-sent to [facility] for review. Interim History / Subjective:  No distress  Objective   Blood pressure (Abnormal) 152/90, pulse 82, temperature 98.6 F (37 C), temperature source Axillary, resp. rate 20, weight 76.1 kg, SpO2 95 %.         Expected Discharge Plan and Services                                                 Social Determinants of Health (SDOH) Interventions    Readmission Risk Interventions No flowsheet data found.

## 2021-05-10 NOTE — Progress Notes (Signed)
PROGRESS NOTE    [name] [name]  JIR:[id number] DOB: 02/08/1935 DOA: 05/07/2021 PCP: [name], MD   Chief Complain: Unresponsiveness  Brief Narrative: Patient is 86 year old male with history of dementia, seizure disorder, BPH, GI bleed, hypertension who presented from home for the evaluation of unresponsiveness while sitting on the chair, failure to thrive.  COVID screening test came out to be positive .  Chest x-ray did not show pneumonia.  Not hypoxic.  Hospital course remarkable for hypertension, hypothermia.  Echocardiogram showed moderate pericardial effusion.  Cardiology, PCCM following.  Assessment & Plan:   Principal Problem:   Syncope Active Problems:   Dyslipidemia   Essential hypertension   BPH with urinary obstruction   Dementia (HCC)   Seizure (HCC)   Pericardial effusion, acute   COVID   Palliative care by specialist   Goals of care, counseling/discussion   General weakness   Episode of unresponsiveness: Recurrent incident.  CT head did not show any acute finding.  He was found to be orthostatic on presentation.  Started on IV fluids.  Cortisol level, TSH level unremarkable.  Syncope could be associated with autonomic dysfunction.  He required multiple fluid boluses, PCCM also following.  Was also started on midodrine.  This morning his blood pressure is high. PT/OT evaluation done, recommended home health on discharge.  Moderate pericardial effusion: As per echo.  No IVC dilation.  Cardiology was consulted.  No evidence of tamponade.  Cardiology did not recommend pericardiocentesis  COVID positivity: Has been COVID-positive since April 18 at facility.  Outside 21 days  for precautions on 5/16.  He is asymptomatic.  Chest x-ray did not show pneumonia.  History of seizure disorder: On Keppra  Hypertension: Takes Cardizem at home, hypertensive on presentation.  This morning his blood pressure was on the higher range.  Continue monitoring.  We will consider  amlodipine 5 mg daily, if he remains hypertensive.Marland Kitchen  BPH: On Flomax at home which is on hold.  Failure to thrive: PT/OT recommended home health on discharge  Dementia: Currently at baseline.  Continue supportive care.  Delirium precautions.  On Namenda and donepezil  Goals of care: Palliative care was also consulted due to his multiple comorbidities, advanced age, dementia.  Palliative care discussed with the family.  Current plan is to continue full scope of treatment.  Pressure Injury 05/08/21 Coccyx Mid Stage 1 -  Intact skin with non-blanchable redness of a localized area usually over a bony prominence. (Active)  05/08/21 0515  Location: Coccyx  Location Orientation: Mid  Staging: Stage 1 -  Intact skin with non-blanchable redness of a localized area usually over a bony prominence.  Wound Description (Comments):   Present on Admission: Yes              DVT prophylaxis:Lovenox Code Status: Full Family Communication: None at bedside Status is: Inpatient  Remains inpatient appropriate because:Inpatient level of care appropriate due to severity of illness   Dispo: The patient is from: Home              Anticipated d/c is to: Home              Patient currently is not medically stable to d/c.   Difficult to place patient No     Consultants: Cardiology, PCCM  Procedures: None  Antimicrobials:  Anti-infectives (From admission, onward)   None      Subjective: Patient seen and examined at the bedside this morning.  He was mildly hypertensive during my  evaluation.  Very comfortable.  Denies any complaints.  No shortness of breath or chest pain.  He knows the current month is May.  He knows he is in the hospital.   Objective: Vitals:   05/10/21 0600 05/10/21 0700 05/10/21 0800 05/10/21 0838  BP: 140/85 (!) 152/90 (!) 149/89   Pulse: 78 82 68   Resp: Temp:    97.6 F (36.4 C)  TempSrc:    Oral  SpO2: 98% 95% 100%   Weight:        Intake/Output  Summary (Last 24 hours) at 05/10/2021 0846 Last data filed at 05/10/2021 0840 Gross per 24 hour  Intake 3644.58 ml  Output 1100 ml  Net 2544.58 ml   Filed Weights   05/08/21 0500 05/09/21 0434 05/09/21 2343  Weight: 71.4 kg 70.4 kg 76.1 kg    Examination:  General exam: Appears calm and comfortable ,Not in distress, pleasant elderly male HEENT:PERRL,Oral mucosa moist, Ear/Nose normal on gross exam Respiratory system: Bilateral equal air entry, normal vesicular breath sounds, no wheezes or crackles  Cardiovascular system: S1 & S2 heard, RRR. No JVD, murmurs, rubs, gallops or clicks. No pedal edema. Gastrointestinal system: Abdomen is nondistended, soft and nontender. No organomegaly or masses felt. Normal bowel sounds heard. Central nervous system: Alert and awake.  Knows current month, knows he is  in the hospital Extremities: No edema, no clubbing ,no cyanosis Skin: No rashes, lesions or ulcers,no icterus ,no pallor     Data Reviewed: I have personally reviewed following labs and imaging studies  CBC: Recent Labs  Lab 05/04/21 1000 05/07/21 1312 05/08/21 0350 05/09/21 0232 05/10/21 0720  WBC 5.9 3.9* 4.2 4.1 4.0  NEUTROABS 4.5 2.6  --  2.4 2.3  HGB 12.5* 11.9* 12.7* 13.1 12.5*  HCT 40.4 39.1 41.4 43.0 40.7  MCV 85.4 85.4 85.5 85.7 85.1  PLT 229 231 242 271 240   Basic Metabolic Panel: Recent Labs  Lab 05/04/21 1000 05/07/21 1312 05/08/21 0350 05/09/21 0232 05/10/21 0720  NA 141 139 142 141 139  K 4.1 3.8 3.7 3.6 3.7  CL 106 104 107 107 107  CO2 GLUCOSE 133* 188* 116* 100* 80  BUN CREATININE 0.75 0.92 0.65 0.62 0.73  CALCIUM 9.1 8.7* 8.9 9.1 8.6*  MG  --   --   --  2.1 1.9   GFR: Estimated Creatinine Clearance: 75.3 mL/min (by C-G formula based on SCr of 0.73 mg/dL). Liver Function Tests: Recent Labs  Lab 05/04/21 1000 05/07/21 1312 05/08/21 0350 05/10/21 0720  AST 33 35 34 29  ALT 39 ALKPHOS 51 49 54 55   BILITOT 0.3 0.4 0.4 0.7  PROT 5.9* 5.5* 5.7* 5.4*  ALBUMIN 3.4* 3.0* 3.1* 2.9*   Recent Labs  Lab 05/07/21 1312  LIPASE 26   No results for input(s): AMMONIA in the last 168 hours. Coagulation Profile: No results for input(s): INR, PROTIME in the last 168 hours. Cardiac Enzymes: No results for input(s): CKTOTAL, CKMB, CKMBINDEX, TROPONINI in the last 168 hours. BNP (last 3 results) No results for input(s): PROBNP in the last 8760 hours. HbA1C: No results for input(s): HGBA1C in the last 72 hours. CBG: No results for input(s): GLUCAP in the last 168 hours. Lipid Profile: No results for input(s): CHOL, HDL, LDLCALC, TRIG, CHOLHDL, LDLDIRECT in the last 72 hours. Thyroid Function Tests: Recent Labs    05/07/21 1919  TSH 0.832   Anemia Panel: No results for input(s): VITAMINB12, FOLATE, FERRITIN, TIBC, IRON, RETICCTPCT in the last 72 hours. Sepsis Labs: Recent Labs  Lab 05/07/21 1312 05/09/21 1356  LATICACIDVEN 1.8 0.6    Recent Results (from the past 240 hour(s))  Culture, blood (Routine X 2) w Reflex to ID Panel     Status: None (Preliminary result)   Collection Time: 05/07/21  1:13 PM   Specimen: BLOOD  Result Value Ref Range Status   Specimen Description   Final    BLOOD RIGHT ANTECUBITAL Performed at [facility], [address] [place], [place] [address]    Special Requests   Final    BOTTLES DRAWN AEROBIC AND ANAEROBIC Blood Culture results may not be optimal due to an inadequate volume of blood received in culture bottles Performed at [facility], [address] [place], [place] [address]    Culture   Final    NO GROWTH 2 DAYS Performed at [facility], [address] [place], [place] [address]    Report Status PENDING  Incomplete  Culture, blood (Routine X 2) w Reflex to ID Panel     Status: None (Preliminary result)   Collection Time: 05/07/21  1:14 PM   Specimen: BLOOD  Result Value Ref Range Status    Specimen Description   Final    BLOOD LEFT ANTECUBITAL Performed at [facility], [address] [place], [place] [address]    Special Requests   Final    BOTTLES DRAWN AEROBIC AND ANAEROBIC Blood Culture results may not be optimal due to an inadequate volume of blood received in culture bottles Performed at [facility], [address] [place], [place] [address]    Culture   Final    NO GROWTH 2 DAYS Performed at [facility], [address] [place], [place] [address]    Report Status PENDING  Incomplete  Resp Panel by RT-PCR (Flu A&B, Covid) Nasopharyngeal Swab     Status: Abnormal   Collection Time: 05/07/21  1:37 PM   Specimen: Nasopharyngeal Swab; Nasopharyngeal(NP) swabs in vial transport medium  Result Value Ref Range Status   SARS Coronavirus 2 by RT PCR POSITIVE (A) NEGATIVE Final    Comment: (NOTE) SARS-CoV-2 target nucleic acids are DETECTED.  The SARS-CoV-2 RNA is generally detectable in upper respiratory specimens during the acute phase of infection. Positive results are indicative of the presence of the identified virus, but do not rule out bacterial infection or co-infection with other pathogens not detected by the test. Clinical correlation with patient history and other diagnostic information is necessary to determine patient infection status. The expected result is Negative.  Fact Sheet for Patients: BloggerCourse.comhttps://www.fda.gov/media/152166/download  Fact Sheet for Healthcare Providers: SeriousBroker.ithttps://www.fda.gov/media/152162/download  This test is not yet approved or cleared by the [place] FDA and  has been authorized for detection and/or diagnosis of SARS-CoV-2 by FDA under an Emergency Use Authorization (EUA).  This EUA will remain in effect (meaning this test can be used) for the duration of  the COVID-19 declaration under Section 564(b)(1) of the A ct, 21 U.S.C. section 360bbb-3(b)(1), unless the authorization  is terminated or revoked sooner.     Influenza A by PCR NEGATIVE NEGATIVE Final   Influenza B by PCR NEGATIVE NEGATIVE Final    Comment: (NOTE) The Xpert Xpress SARS-CoV-2/FLU/RSV plus assay is intended as an aid in the diagnosis of influenza from Nasopharyngeal swab specimens and should  not be used as a sole basis for treatment. Nasal washings and aspirates are unacceptable for Xpert Xpress SARS-CoV-2/FLU/RSV testing.  Fact Sheet for Patients: BloggerCourse.com  Fact Sheet for Healthcare Providers: SeriousBroker.it  This test is not yet approved or cleared by the [place] FDA and has been authorized for detection and/or diagnosis of SARS-CoV-2 by FDA under an Emergency Use Authorization (EUA). This EUA will remain in effect (meaning this test can be used) for the duration of the COVID-19 declaration under Section 564(b)(1) of the Act, 21 U.S.C. section 360bbb-3(b)(1), unless the authorization is terminated or revoked.  Performed at [facility], [address], [place], [place] [address]   Urine Culture     Status: None   Collection Time: 05/07/21  2:33 PM   Specimen: Urine, Catheterized  Result Value Ref Range Status   Specimen Description   Final    URINE, CATHETERIZED Performed at [facility], [address], [place], [place] [address]    Special Requests   Final    NONE Performed at [facility], [address], [place], [place] [address]    Culture   Final    NO GROWTH Performed at [facility], [address] [place], [place] [address]    Report Status 05/09/2021 FINAL  Final  MRSA PCR Screening     Status: None   Collection Time: 05/09/21 12:39 PM   Specimen: Nasal Mucosa; Nasopharyngeal  Result Value Ref Range Status   MRSA by PCR NEGATIVE NEGATIVE Final    Comment:        The GeneXpert MRSA Assay (FDA approved for NASAL  specimens only), is one component of a comprehensive MRSA colonization surveillance program. It is not intended to diagnose MRSA infection nor to guide or monitor treatment for MRSA infections. Performed at [facility], [address], [place], [place] [address]          Radiology Studies: ECHOCARDIOGRAM COMPLETE  Result Date: 05/08/2021    ECHOCARDIOGRAM REPORT   Patient Name:   [name] [name] Date of Exam: 05/08/2021 Medical Rec #:  [id number]        Height:       73.0 in Accession #:    [id number]       Weight:       157.4 lb Date of Birth:  [birth year]        BSA:          1.943 m Patient Age:    85 years         BP:           162/82 mmHg Patient Gender: M                HR:           55 bpm. Exam Location:  Inpatient Procedure: 2D Echo, Cardiac Doppler and Color Doppler Indications:    R55 Syncope  History:        Patient has no prior history of Echocardiogram examinations.                 Risk Factors:Hypertension and Dyslipidemia. COVID-19 Positive.  Sonographer:    Elmarie Shiley Dance Referring Phys: [id number] Cecille Po MELVIN IMPRESSIONS  1. Left ventricular ejection fraction, by estimation, is 65 to 70%. The left ventricle has normal function. The left ventricle has no regional wall motion abnormalities. There is moderate asymmetric left ventricular hypertrophy of the septal segment. Left ventricular diastolic parameters  are indeterminate.  2. Right ventricular systolic function is normal. The right ventricular size is normal. There is normal pulmonary artery systolic pressure. The estimated right ventricular systolic pressure is 19.2 mmHg.  3. Early diastolic compression of RV free wall and RA without obvious respiratory variation in mitral outflow. Tamponade is not suggested, but effusion has potential for hemodynamic significance and should be followed. Consider limited echocardiogram in  the next 48-72 hours. Moderate pericardial effusion. The pericardial effusion is  circumferential, but largest anteriorly.  4. The mitral valve is grossly normal. Trivial mitral valve regurgitation.  5. The aortic valve is tricuspid. Aortic valve regurgitation is mild. Mild to moderate aortic valve sclerosis/calcification is present, without any evidence of aortic stenosis. Aortic regurgitation PHT measures 620 msec.  6. Aortic dilatation noted. There is mild dilatation of the ascending aorta, measuring 42 mm.  7. The inferior vena cava is normal in size with greater than 50% respiratory variability, suggesting right atrial pressure of 3 mmHg. FINDINGS  Left Ventricle: Left ventricular ejection fraction, by estimation, is 65 to 70%. The left ventricle has normal function. The left ventricle has no regional wall motion abnormalities. The left ventricular internal cavity size was normal in size. There is  moderate asymmetric left ventricular hypertrophy of the septal segment. Left ventricular diastolic parameters are indeterminate. Right Ventricle: The right ventricular size is normal. No increase in right ventricular wall thickness. Right ventricular systolic function is normal. There is normal pulmonary artery systolic pressure. The tricuspid regurgitant velocity is 2.01 m/s, and  with an assumed right atrial pressure of 3 mmHg, the estimated right ventricular systolic pressure is 19.2 mmHg. Left Atrium: Left atrial size was normal in size. Right Atrium: Right atrial size was normal in size. Pericardium: Early diastolic compression of RV free wall and RA without obvious respiratory variation in mitral outflow. Tamponade is not suggested, but effusion has potential for hemodynamic significance and should be followed. Consider limited echocardiogram in the next 48-72 hours. A moderately sized pericardial effusion is present. The pericardial effusion is circumferential. Mitral Valve: The mitral valve is grossly normal. Trivial mitral valve regurgitation. Tricuspid Valve: The tricuspid valve is  grossly normal. Tricuspid valve regurgitation is trivial. Aortic Valve: The aortic valve is tricuspid. There is mild aortic valve annular calcification. Aortic valve regurgitation is mild. Aortic regurgitation PHT measures 620 msec. Mild to moderate aortic valve sclerosis/calcification is present, without any evidence of aortic stenosis. Pulmonic Valve: The pulmonic valve was grossly normal. Pulmonic valve regurgitation is trivial. Aorta: Aortic dilatation noted. There is mild dilatation of the ascending aorta, measuring 42 mm. Venous: The inferior vena cava is normal in size with greater than 50% respiratory variability, suggesting right atrial pressure of 3 mmHg. IAS/Shunts: No atrial level shunt detected by color flow Doppler.  LEFT VENTRICLE PLAX 2D LVIDd:         4.50 cm Diastology LVIDs:         2.40 cm LV e' medial:    4.03 cm/s LV PW:         1.00 cm LV E/e' medial:  17.0 LV IVS:        1.60 cm LV e' lateral:   5.55 cm/s                        LV E/e' lateral: 12.4  RIGHT VENTRICLE             IVC RV Basal diam:  2.90 cm  IVC diam: 1.70 cm RV S prime:     14.10 cm/s TAPSE (M-mode): 1.8 cm LEFT ATRIUM             Index       RIGHT ATRIUM           Index LA diam:        3.50 cm 1.80 cm/m  RA Area:     11.20 cm LA Vol (A2C):   52.4 ml 26.96 ml/m RA Volume:   20.70 ml  10.65 ml/m LA Vol (A4C):   31.9 ml 16.41 ml/m LA Biplane Vol: 41.6 ml 21.41 ml/m  AORTIC VALVE LVOT Vmax:   99.10 cm/s LVOT Vmean:  64.200 cm/s LVOT VTI:    0.209 m AI PHT:      620 msec  AORTA Ao Asc diam: 4.20 cm MITRAL VALVE               TRICUSPID VALVE MV Area (PHT): 2.75 cm    TR Peak grad:   16.2 mmHg MV Decel Time: 276 msec    TR Vmax:        201.00 cm/s MV E velocity: 68.60 cm/s MV A velocity: 74.70 cm/s  SHUNTS MV E/A ratio:  0.92        Systemic VTI: 0.21 m Nona Dell MD Electronically signed by Nona Dell MD Signature Date/Time: 05/08/2021/12:56:36 PM    Final    VAS [place] LOWER EXTREMITY VENOUS (DVT)  Result Date:  05/08/2021  Lower Venous DVT Study Patient Name:  [name] [name]  Date of Exam:   05/08/2021 Medical Rec #: [id number]         Accession #:    [id number] Date of Birth: Jun 02, 1935         Patient Gender: M Patient Age:   85Y Exam Location[facility]  [facility] Procedure:      VAS [place] LOWER EXTREMITY VENOUS (DVT) Referring Phys: [id number] FANG XU --------------------------------------------------------------------------------  Indications: Edema, and COVID+.  Comparison Study: No previous exams Performing Technologist: Ernestene Mention  Examination Guidelines: A complete evaluation includes B-mode imaging, spectral Doppler, color Doppler, and power Doppler as needed of all accessible portions of each vessel. Bilateral testing is considered an integral part of a complete examination. Limited examinations for reoccurring indications may be performed as noted. The reflux portion of the exam is performed with the patient in reverse Trendelenburg.  +---------+---------------+---------+-----------+----------+--------------+ RIGHT    CompressibilityPhasicitySpontaneityPropertiesThrombus Aging +---------+---------------+---------+-----------+----------+--------------+ CFV      Full           Yes      Yes                                 +---------+---------------+---------+-----------+----------+--------------+ SFJ      Full                                                        +---------+---------------+---------+-----------+----------+--------------+ FV Prox  Full           Yes      Yes                                 +---------+---------------+---------+-----------+----------+--------------+ FV Mid   Full  Yes      Yes                                 +---------+---------------+---------+-----------+----------+--------------+ FV DistalFull           Yes      Yes                                 +---------+---------------+---------+-----------+----------+--------------+ PFV       Full                                                        +---------+---------------+---------+-----------+----------+--------------+ POP      Full           Yes      Yes                                 +---------+---------------+---------+-----------+----------+--------------+ PTV      Full                                                        +---------+---------------+---------+-----------+----------+--------------+ PERO     Full                                                        +---------+---------------+---------+-----------+----------+--------------+   +---------+---------------+---------+-----------+----------+--------------+ LEFT     CompressibilityPhasicitySpontaneityPropertiesThrombus Aging +---------+---------------+---------+-----------+----------+--------------+ CFV      Full           Yes      Yes                                 +---------+---------------+---------+-----------+----------+--------------+ SFJ      Full                                                        +---------+---------------+---------+-----------+----------+--------------+ FV Prox  Full           Yes      Yes                                 +---------+---------------+---------+-----------+----------+--------------+ FV Mid   Full           Yes      Yes                                 +---------+---------------+---------+-----------+----------+--------------+ FV DistalFull           Yes      Yes                                 +---------+---------------+---------+-----------+----------+--------------+  PFV      Full                                                        +---------+---------------+---------+-----------+----------+--------------+ POP      Full           Yes      Yes                                 +---------+---------------+---------+-----------+----------+--------------+ PTV      Full                                                         +---------+---------------+---------+-----------+----------+--------------+ PERO     Full                                                        +---------+---------------+---------+-----------+----------+--------------+     Summary: BILATERAL: - No evidence of deep vein thrombosis seen in the lower extremities, bilaterally. - RIGHT: - A cystic structure is found in the popliteal fossa.  LEFT: - A cystic structure is found in the popliteal fossa.  *See table(s) above for measurements and observations. Electronically signed by Lemar Livings MD on 05/08/2021 at 11:05:11 AM.    Final    ECHOCARDIOGRAM LIMITED  Result Date: 05/09/2021    ECHOCARDIOGRAM LIMITED REPORT   Patient Name:   [name] [name] Date of Exam: 05/09/2021 Medical Rec #:  [id number]        Height:       73.0 in Accession #:    [id number]       Weight:       155.2 lb Date of Birth:  March 05, 1935        BSA:          1.932 m Patient Age:    85 years         BP:           102/67 mmHg Patient Gender: M                HR:           610 bpm. Exam Location:  Inpatient Procedure: Limited Echo, Color Doppler and Cardiac Doppler STAT ECHO Indications:    Pericardial effusion I31.3  History:        Patient has prior history of Echocardiogram examinations, most                 recent 05/08/2021. Hypertension and Dyslipidemia. COVID-19.                 Dementia.  Sonographer:    Leta Jungling RDCS Referring Phys: [id number] FANG XU IMPRESSIONS  1. Limited study to fu pericardial effusion; full doppler not performed; moderate pericardial effusion with mild RA collapse; IVC upper normal but collapses; no diagnostic flow variation across MV; suggest FU echos and clinical correlation.  2. Left ventricular ejection fraction, by estimation,  is 55 to 60%. The left ventricle has normal function.  3. Right ventricular systolic function is normal. The right ventricular size is normal.  4. Moderate pericardial effusion.  5. Aortic dilatation noted. There  is mild dilatation of the ascending aorta, measuring 42 mm. FINDINGS  Left Ventricle: Left ventricular ejection fraction, by estimation, is 55 to 60%. The left ventricle has normal function. Right Ventricle: The right ventricular size is normal. Right ventricular systolic function is normal. Left Atrium: Left atrial size was normal in size. Right Atrium: Right atrial size was normal in size. Pericardium: A moderately sized pericardial effusion is present. Aorta: Aortic dilatation noted. There is mild dilatation of the ascending aorta, measuring 42 mm. Additional Comments: Limited study to fu pericardial effusion; full doppler not performed; moderate pericardial effusion with mild RA collapse; IVC upper normal but collapses; no diagnostic flow variation across MV; suggest FU echos and clinical correlation. LEFT VENTRICLE PLAX 2D LVOT diam:     2.00 cm LV SV:         58 LV SV Index:   30 LVOT Area:     3.14 cm                         3D Volume EF:                         3D EF:        57 %                         LV EDV:       88 ml                         LV ESV:       38 ml                         LV SV:        50 ml AORTIC VALVE LVOT Vmax:   94.20 cm/s LVOT Vmean:  57.900 cm/s LVOT VTI:    0.185 m  SHUNTS Systemic VTI:  0.18 m Systemic Diam: 2.00 cm Olga Millers MD Electronically signed by Olga Millers MD Signature Date/Time: 05/09/2021/12:31:56 PM    Final         Scheduled Meds: . Chlorhexidine Gluconate Cloth  6 each Topical Daily  . divalproex  1,000 mg Oral QHS  . donepezil  20 mg Oral QHS  . enoxaparin (LOVENOX) injection  40 mg Subcutaneous Q24H  . levETIRAcetam  500 mg Oral BID  . mouth rinse  15 mL Mouth Rinse BID  . memantine  10 mg Oral BID  . midodrine  10 mg Oral TID WC  . risperiDONE  0.5 mg Oral QHS   Continuous Infusions: . lactated ringers 100 mL/hr at 05/10/21 0838     LOS: 1 day    Time spent: 35 mins.More than 50% of that time was spent in counseling and/or  coordination of care.      [name], MD [facility] P5/16/2022, 8:46 AM

## 2021-05-10 NOTE — Progress Notes (Signed)
Nutrition Brief Note  Patient identified on the Malnutrition Screening Tool (MST) Report with score of 2.0  Wt Readings from Last 15 Encounters:  05/09/21 76.1 kg  05/04/21 77 kg  04/09/21 77 kg  03/22/21 77.1 kg  02/25/21 77.8 kg  02/17/21 78 kg  12/22/20 77.1 kg  07/13/20 83.2 kg  02/25/20 85.7 kg  02/06/20 86.6 kg  02/22/19 85.8 kg  03/15/18 86.5 kg  02/20/18 88.1 kg  01/24/18 86.2 kg  02/14/17 90.6 kg    Body mass index is 22.14 kg/m. Patient meets criteria for normal weight based on current BMI. Weight has been stable for the past 5 months. Stage 1 pressure injury to coccyx.  He is noted to be a/o to self and place. Patient sleeping and no family/visitors present. Able to talk with RN and tech.   Tech fed patient breakfast and lunch as patient is unable to feed himself. She denies him pocketing or coughing during meal and that he ate ~75% of both meals. Tech reports that patient seems to be very thirsty and has drank several cartons of juice today.   He ate 100% of breakfast and lunch and 45% of dinner on 5/14 (total of 1366 kcal and 64 grams protein). Breakfast and lunch intake today provided a total of 941 kcal and 46 grams protein.  Labs and medications reviewed.   No nutrition interventions warranted at this time. If nutrition issues arise, please consult RD.       Trenton Gammon, MS, RD, LDN, CNSC Inpatient Clinical Dietitian RD pager # available in [facility]  After hours/weekend pager # available in [facility]

## 2021-05-10 NOTE — Progress Notes (Signed)
NAME:  [name] [name], MRN:  [id number], DOB:  01-10-1935, LOS: 1 ADMISSION DATE:  05/07/2021, CONSULTATION DATE:  05/09/21 REFERRING MD:  Roda Shutters, CHIEF COMPLAINT:  unresponsive   History of Present Illness:  86 year old man with history of dementia, questionable seizure disorder presenting with progressive failure to thrive and an episode of unresponsiveness at home.  COVID returned positive. CXR clear.  This AM became more hypotensive and hypothermic.  PCCM consulted for further recs.  Pertinent  Medical History  Dementia BPH On AEDs for question of seizure  Significant Hospital Events: Including procedures, antibiotic start and stop dates in addition to other pertinent events   .  5/14 admitted . 5/15 PCCM consulted. Started midodrine. Encouraged hydration. Cards consulted for pericardial effusion. LV fxn was nml. There was mild RV collapse BUT IVC was not dilated & no evidence of clinical tamponade. At time of cards consult actually Hypertensive. Palliative consulted. Goals of care discussion initiated. Family noted decline at home. Freq falls at home. Wife stated re: living will pt would want all interventions unless brain death   Interim History / Subjective:  No distress  Objective   Blood pressure (Abnormal) 152/90, pulse 82, temperature 98.6 F (37 C), temperature source Axillary, resp. rate 20, weight 76.1 kg, SpO2 95 %.        Intake/Output Summary (Last 24 hours) at 05/10/2021 0732 Last data filed at 05/09/2021 1900 Gross per 24 hour  Intake 3644.58 ml  Output 550 ml  Net 3094.58 ml   Filed Weights   05/08/21 0500 05/09/21 0434 05/09/21 2343  Weight: 71.4 kg 70.4 kg 76.1 kg    Examination: General awake and alert no distress.  HENT NCAT no JVD pulm clear  Card rrr abd soft not tender Ext warm and dry  Neuro awake. Affect flat. Weak. Has tremor  GU cl yellow  Labs/imaging that I have personally reviewed  (right click and "Reselect all SmartList Selections"  daily)  See below   Resolved Hospital Problem list   Shock/vasoplegia ->resoled w/ hydration so may have been more hypovolemia than dysautonomia.  Unresponsive/vagal episode.  Assessment & Plan:    Pericardial effusion  -had RV collapse but IVC not dilated. No clinical evidence of tamponade. Cards following Plan Repeat ECHO next week No indication for intervention  Circulatory shock. This has resolved. Inclined to think multifactorial. Volume depletion, CCB, alpha blocker. This would be fitting given general failure to thrive in a patient with dementia. Do not think that the pericardial effusion was of clinical significance. Dysautonomia a consideration as now hypertensive but haven't seen big swings in BP.  Plan Reduce midodrine to 5 mg tid  Cont to hold his alpha blocker and CCB Cont gentle hydration and encourage PO fluid intake OK to move out of ICU   Hypertension Plan Would hold off from adding anything while on midodrine. If you wean midodrine off and he is still hypertensive then could consider.   ? seizure Plan Cont AEDs  COVID Still in 90 day window Plan Cont isolation till 5/16  Dementia with Failure to thrive as evidenced by: freq falls, malnutrition. Functional decline -I am concerned that the decision to continue "full code" unless brain death suggests an incomplete understanding of what that means should he suffer an arrest. ACLS interventions, mechanical ventilation, CPR would only result in suffering & would only result in further functional decline. From a critical care standpoint would we do not recommend Full code status and hope that further  engagement with family and time helps them to understand the negative and potentially painful consequences should he have to endure such a thing.   Best practice (right click and "Reselect all SmartList Selections" daily)  Per primary   Critical care time:    We will sign off. Nothing for [place] to offer.   Simonne Martinet ACNP-BC [facility] Pulmonary/Critical Care Pager # [phone] OR # [phone] if no answer

## 2021-05-10 NOTE — Progress Notes (Signed)
Report called to [place], [place] RN. All questions answered at this time. All pt belongings and paper chart transported with pt. Pt was transferred in the bed on tele. 4W will continue to care for pt.

## 2021-05-10 NOTE — Plan of Care (Signed)

## 2021-05-11 DIAGNOSIS — L8990 Pressure ulcer of unspecified site, unspecified stage: Secondary | ICD-10-CM | POA: Insufficient documentation

## 2021-05-11 DIAGNOSIS — R55 Syncope and collapse: Secondary | ICD-10-CM | POA: Diagnosis not present

## 2021-05-11 NOTE — Progress Notes (Signed)
Daily weight not done, bed not zeroed with seizure pads on it.
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
## 2021-05-11 NOTE — Progress Notes (Signed)
PROGRESS NOTE    [name] [name]  YQM:[id number] DOB: 07-Apr-1935 DOA: 05/07/2021 PCP: [name], MD   Chief Complain: Unresponsiveness  Brief Narrative: Patient is 86 year old male with history of dementia, seizure disorder, BPH, GI bleed, hypertension who presented from home for the evaluation of unresponsiveness while sitting on the chair, failure to thrive.  COVID screening test came out to be positive .  Chest x-ray did not show pneumonia.  Not hypoxic.  Hospital course remarkable for hypertension, hypothermia.  Echocardiogram showed moderate pericardial effusion.  Cardiology, PCCM following , now signed off.  Plan for discharge tomorrow  Assessment & Plan:   Principal Problem:   Syncope Active Problems:   Dyslipidemia   Essential hypertension   BPH with urinary obstruction   Dementia (HCC)   Seizure (HCC)   Pericardial effusion, acute   COVID   Palliative care by specialist   Goals of care, counseling/discussion   General weakness   Pressure injury of skin   Episode of unresponsiveness: Recurrent incident.  CT head did not show any acute finding.  He was found to be orthostatic on presentation.  Started on IV fluids.  Cortisol level, TSH level unremarkable.  Syncope could be associated with autonomic dysfunction.  He required multiple fluid boluses, PCCM also following.  Was also started on midodrine.  This morning his blood pressure was high. PT/OT evaluation done, recommended home health on discharge.  Moderate pericardial effusion: As per echo.  No IVC dilation.  Cardiology was consulted.  No evidence of tamponade.  Cardiology did not recommend pericardiocentesis.  Cardiology recommended to repeat echocardiogram in 2-4 weeks with follow-up at cardiology clinic.  Echo showed normal left ventricular function.  COVID positivity: Has been COVID-positive since April 18 at facility.  Outside 21 days  for precautions on 5/16.  He is asymptomatic.  Chest x-ray did not show  pneumonia.  History of seizure disorder: On Keppra  Hypertension: Takes Cardizem at home, hypotensive on presentation.  This morning his blood pressure was on the higher range.  Continue monitoring.  Antihypertensives on hold.  BPH: On Flomax at home which is on hold.  Failure to thrive: PT/OT recommended home health on discharge  Dementia: Currently at baseline.  Continue supportive care.  Delirium precautions.  On Namenda and donepezil  Goals of care: Palliative care was also consulted due to his multiple comorbidities, advanced age, dementia.  Palliative care discussed with the family.  Current plan is to continue full scope of treatment.  Pressure Injury 05/08/21 Coccyx Mid Stage 1 -  Intact skin with non-blanchable redness of a localized area usually over a bony prominence. (Active)  05/08/21 0515  Location: Coccyx  Location Orientation: Mid  Staging: Stage 1 -  Intact skin with non-blanchable redness of a localized area usually over a bony prominence.  Wound Description (Comments):   Present on Admission: Yes              DVT prophylaxis:Lovenox Code Status: Full Family Communication: Wife on phone, son at bedside Status is: Inpatient  Remains inpatient appropriate because:Inpatient level of care appropriate due to severity of illness   Dispo: The patient is from: Home              Anticipated d/c is to: Home              Patient currently is not medically stable to d/c.   Difficult to place patient No     Consultants: Cardiology, PCCM  Procedures: None  Antimicrobials:  Anti-infectives (From admission, onward)   None      Subjective: Patient seen and examined at bedside this morning.  Hemodynamically stable.  Comfortable.  No complaints.  On room air   Objective: Vitals:   05/10/21 2034 05/11/21 0007 05/11/21 0434 05/11/21 1256  BP: (!) 181/90 (!) 179/89 (!) 161/78 126/79  Pulse: 71 63 (!) 55 72  Resp: 14 14 12 20   Temp: 98.5 F (36.9 C) 98.4  F (36.9 C) 97.7 F (36.5 C) 97.8 F (36.6 C)  TempSrc: Oral Oral Oral   SpO2: 100% 99% 100% 98%  Weight:        Intake/Output Summary (Last 24 hours) at 05/11/2021 1328 Last data filed at 05/11/2021 1226 Gross per 24 hour  Intake 2235.66 ml  Output 3655 ml  Net -1419.34 ml   Filed Weights   05/08/21 0500 05/09/21 0434 05/09/21 2343  Weight: 71.4 kg 70.4 kg 76.1 kg    Examination:  General exam: Overall comfortable, not in distress HEENT: PERRL Respiratory system:  no wheezes or crackles  Cardiovascular system: S1 & S2 heard, RRR.  Gastrointestinal system: Abdomen is nondistended, soft and nontender. Central nervous system: Alert and oriented Extremities: No edema, no clubbing ,no cyanosis Skin: No rashes, no ulcers,no icterus     Data Reviewed: I have personally reviewed following labs and imaging studies  CBC: Recent Labs  Lab 05/07/21 1312 05/08/21 0350 05/09/21 0232 05/10/21 0720  WBC 3.9* 4.2 4.1 4.0  NEUTROABS 2.6  --  2.4 2.3  HGB 11.9* 12.7* 13.1 12.5*  HCT 39.1 41.4 43.0 40.7  MCV 85.4 85.5 85.7 85.1  PLT 231 242 271 240   Basic Metabolic Panel: Recent Labs  Lab 05/07/21 1312 05/08/21 0350 05/09/21 0232 05/10/21 0720  NA 139 142 141 139  K 3.8 3.7 3.6 3.7  CL 104 107 107 107  CO2 29 30 27 27   GLUCOSE 188* 116* 100* 80  BUN 15 9 9 9   CREATININE 0.92 0.65 0.62 0.73  CALCIUM 8.7* 8.9 9.1 8.6*  MG  --   --  2.1 1.9   GFR: Estimated Creatinine Clearance: 75.3 mL/min (by C-G formula based on SCr of 0.73 mg/dL). Liver Function Tests: Recent Labs  Lab 05/07/21 1312 05/08/21 0350 05/10/21 0720  AST 35 34 29  ALT 30 29 24   ALKPHOS 49 54 55  BILITOT 0.4 0.4 0.7  PROT 5.5* 5.7* 5.4*  ALBUMIN 3.0* 3.1* 2.9*   Recent Labs  Lab 05/07/21 1312  LIPASE 26   No results for input(s): AMMONIA in the last 168 hours. Coagulation Profile: No results for input(s): INR, PROTIME in the last 168 hours. Cardiac Enzymes: No results for input(s):  CKTOTAL, CKMB, CKMBINDEX, TROPONINI in the last 168 hours. BNP (last 3 results) No results for input(s): PROBNP in the last 8760 hours. HbA1C: No results for input(s): HGBA1C in the last 72 hours. CBG: No results for input(s): GLUCAP in the last 168 hours. Lipid Profile: No results for input(s): CHOL, HDL, LDLCALC, TRIG, CHOLHDL, LDLDIRECT in the last 72 hours. Thyroid Function Tests: No results for input(s): TSH, T4TOTAL, FREET4, T3FREE, THYROIDAB in the last 72 hours. Anemia Panel: No results for input(s): VITAMINB12, FOLATE, FERRITIN, TIBC, IRON, RETICCTPCT in the last 72 hours. Sepsis Labs: Recent Labs  Lab 05/07/21 1312 05/09/21 1356  LATICACIDVEN 1.8 0.6    Recent Results (from the past 240 hour(s))  Culture, blood (Routine X 2) w Reflex to ID Panel     Status: None (  Preliminary result)   Collection Time: 05/07/21  1:13 PM   Specimen: BLOOD  Result Value Ref Range Status   Specimen Description   Final    BLOOD RIGHT ANTECUBITAL Performed at [facility], [address] [place], [place] [address]    Special Requests   Final    BOTTLES DRAWN AEROBIC AND ANAEROBIC Blood Culture results may not be optimal due to an inadequate volume of blood received in culture bottles Performed at [facility], [address] [place], [place] [address]    Culture   Final    NO GROWTH 4 DAYS Performed at [facility], [address] [place], [place] [address]    Report Status PENDING  Incomplete  Culture, blood (Routine X 2) w Reflex to ID Panel     Status: None (Preliminary result)   Collection Time: 05/07/21  1:14 PM   Specimen: BLOOD  Result Value Ref Range Status   Specimen Description   Final    BLOOD LEFT ANTECUBITAL Performed at [facility], [address] [place], [place] [address]    Special Requests   Final    BOTTLES DRAWN AEROBIC AND ANAEROBIC Blood Culture results may not be optimal due to an inadequate  volume of blood received in culture bottles Performed at [facility], [address] [place], [place] [address]    Culture   Final    NO GROWTH 4 DAYS Performed at [facility], [address] [place], [place] [address]    Report Status PENDING  Incomplete  Resp Panel by RT-PCR (Flu A&B, Covid) Nasopharyngeal Swab     Status: Abnormal   Collection Time: 05/07/21  1:37 PM   Specimen: Nasopharyngeal Swab; Nasopharyngeal(NP) swabs in vial transport medium  Result Value Ref Range Status   SARS Coronavirus 2 by RT PCR POSITIVE (A) NEGATIVE Final    Comment: (NOTE) SARS-CoV-2 target nucleic acids are DETECTED.  The SARS-CoV-2 RNA is generally detectable in upper respiratory specimens during the acute phase of infection. Positive results are indicative of the presence of the identified virus, but do not rule out bacterial infection or co-infection with other pathogens not detected by the test. Clinical correlation with patient history and other diagnostic information is necessary to determine patient infection status. The expected result is Negative.  Fact Sheet for Patients: BloggerCourse.comhttps://www.fda.gov/media/152166/download  Fact Sheet for Healthcare Providers: SeriousBroker.ithttps://www.fda.gov/media/152162/download  This test is not yet approved or cleared by the [place] FDA and  has been authorized for detection and/or diagnosis of SARS-CoV-2 by FDA under an Emergency Use Authorization (EUA).  This EUA will remain in effect (meaning this test can be used) for the duration of  the COVID-19 declaration under Section 564(b)(1) of the A ct, 21 U.S.C. section 360bbb-3(b)(1), unless the authorization is terminated or revoked sooner.     Influenza A by PCR NEGATIVE NEGATIVE Final   Influenza B by PCR NEGATIVE NEGATIVE Final    Comment: (NOTE) The Xpert Xpress SARS-CoV-2/FLU/RSV plus assay is intended as an aid in the diagnosis of influenza from Nasopharyngeal swab  specimens and should not be used as a sole basis for treatment. Nasal washings and aspirates are unacceptable for Xpert Xpress SARS-CoV-2/FLU/RSV testing.  Fact Sheet for Patients: BloggerCourse.comhttps://www.fda.gov/media/152166/download  Fact Sheet for Healthcare Providers: SeriousBroker.ithttps://www.fda.gov/media/152162/download  This test is not yet approved or cleared by the [place] FDA and has been authorized for detection and/or diagnosis of SARS-CoV-2 by FDA under an Emergency  Use Authorization (EUA). This EUA will remain in effect (meaning this test can be used) for the duration of the COVID-19 declaration under Section 564(b)(1) of the Act, 21 U.S.C. section 360bbb-3(b)(1), unless the authorization is terminated or revoked.  Performed at [facility], [address], [place], [place] [address]   Urine Culture     Status: None   Collection Time: 05/07/21  2:33 PM   Specimen: Urine, Catheterized  Result Value Ref Range Status   Specimen Description   Final    URINE, CATHETERIZED Performed at [facility], [address], [place], [place] [address]    Special Requests   Final    NONE Performed at [facility], [address], [place], [place] [address]    Culture   Final    NO GROWTH Performed at [facility], [address], [place], [place] [address]    Report Status 05/09/2021 FINAL  Final  MRSA PCR Screening     Status: None   Collection Time: 05/09/21 12:39 PM   Specimen: Nasal Mucosa; Nasopharyngeal  Result Value Ref Range Status   MRSA by PCR NEGATIVE NEGATIVE Final    Comment:        The GeneXpert MRSA Assay (FDA approved for NASAL specimens only), is one component of a comprehensive MRSA colonization surveillance program. It is not intended to diagnose MRSA infection nor to guide or monitor treatment for MRSA infections. Performed at [facility], [address], [place], [place] [address]           Radiology Studies: No results found.      Scheduled Meds: . divalproex  1,000 mg Oral QHS  . donepezil  20 mg Oral QHS  . enoxaparin (LOVENOX) injection  40 mg Subcutaneous Q24H  . levETIRAcetam  500 mg Oral BID  . mouth rinse  15 mL Mouth Rinse BID  . memantine  10 mg Oral BID  . midodrine  5 mg Oral TID WC  . risperiDONE  0.5 mg Oral QHS   Continuous Infusions:    LOS: 2 days    Time spent: 35 mins.More than 50% of that time was spent in counseling and/or coordination of care.      [name], MD [facility] P5/17/2022, 1:28 PM

## 2021-05-11 NOTE — Progress Notes (Signed)
OT Cancellation Note  Patient Details Name: NAZIER NEYHART MRN: [id number] DOB: 27-Apr-1935   Cancelled Treatment:    Reason Eval/Treat Not Completed: OT screened, no needs identified, will sign off. Imminent D/C order received, patient seen by OT services with evaluation completed on 5/14. Please see note for recommendations as patient appears at this baseline needing significant assist at baseline and spouse not wanting to pursue short term rehab.   Marlyce Huge OT OT pager: [phone]   Carmelia Roller 05/11/2021, 1:01 PM

## 2021-05-11 NOTE — Plan of Care (Signed)

## 2021-05-12 ENCOUNTER — Telehealth: Payer: Self-pay | Admitting: Family Medicine

## 2021-05-12 DIAGNOSIS — R55 Syncope and collapse: Secondary | ICD-10-CM | POA: Diagnosis not present

## 2021-05-12 LAB — CULTURE, BLOOD (ROUTINE X 2)
Culture: NO GROWTH
Culture: NO GROWTH

## 2021-05-12 MED ORDER — MIDODRINE HCL 5 MG PO TABS: 5.0000 mg | ORAL_TABLET | Freq: Three times a day (TID) | ORAL | 1 refills | Status: DC

## 2021-05-12 MED ORDER — DIVALPROEX SODIUM ER 500 MG PO TB24: 1000.0000 mg | ORAL_TABLET | Freq: Every day | ORAL | 6 refills | Status: DC

## 2021-05-12 NOTE — TOC Transition Note (Signed)
Transition of Care [facility]) - CM/SW Discharge Note   Patient Details  Name: [name] [name] MRN: [id number] Date of Birth: [birth year]  [facility] CM/SW Contact:  Darleene Cleaver, LCSW Phone Number: 05/12/2021, 2:14 PM   Clinical Narrative:     Patient will be going home with home health through [facility], patient will be receiving PT, OT, RN, Aide, and Social Work. CSW signing off please reconsult with any other social work needs, home health agency has been notified of planned discharge.  Family is requesting EMS transport back home.  Family had some concerns about the hospital bed they have through [facility].  CSW notified Ian Malkin from [facility] to reach out to the family.    Final next level of care: Home w Home Health Services Barriers to Discharge: Barriers Resolved   Patient Goals and CMS Choice Patient states their goals for this hospitalization and ongoing recovery are:: To return back home with home health. CMS Medicare.gov Compare Post Acute Care list provided to:: Patient Represenative (must comment) Choice offered to / list presented to : Spouse,Adult Children  Discharge Placement                       Discharge Plan and Services   Discharge Planning Services: CM Consult                      HH Arranged: RN,PT,OT,Nurse's Aide,Social Work [facility] Agency: Other - See comment ([facility]) Date HH Agency Contacted: 05/12/21 Time HH Agency Contacted: [id number] Representative spoke with at [facility] Agency: Eber Jones  Social Determinants of Health (SDOH) Interventions     Readmission Risk Interventions No flowsheet data found.

## 2021-05-12 NOTE — Discharge Summary (Signed)
Physician Discharge Summary  EMANUELL MORINA ZOX:[id number] DOB: Jul 23, 1935 DOA: 05/07/2021  PCP: [name], MD  Admit date: 05/07/2021 Discharge date: 05/12/2021  Admitted From: Home Disposition:  Home  Discharge Condition:Stable CODE STATUS:FULL Diet recommendation: Heart Healthy  Brief/Interim Summary:  Patient is 86 year old male with history of dementia, seizure disorder, BPH, GI bleed, hypertension who presented from home for the evaluation of unresponsiveness while sitting on the chair, failure to thrive.  COVID screening test came out to be positive .  Chest x-ray did not show pneumonia.  Not hypoxic.  Hospital course remarkable for hypotension, hypothermia.  Echocardiogram showed moderate pericardial effusion.  Cardiology did not recommend pericardiocentesis.  Recommended to follow-up as an outpatient and repeat echocardiogram in 3 to 4 weeks.  His syncopal episode was most likely secondary to autonomic dysfunction.  Due to his advanced dementia, multiple comorbidities, goals of care were discussed, palliative care was consulted but family wants to continue full scope of treatment.   PT/OT recommended home health on discharge.  He has high risk for falling.  Medically stable for discharge to home today.  He needs cardiology follow-up as an outpatient.  Following problems were addressed during his hospitalization:   Episode of unresponsiveness: Recurrent incident.  CT head did not show any acute finding.  He was found to be orthostatic on presentation.  Started on IV fluids.  Cortisol level, TSH level unremarkable.  Syncope could be associated with autonomic dysfunction.  He required multiple fluid boluses, PCCM also following.  Started on midodrine.    Currently blood pressure stable, slightly on the higher range .PT/OT evaluation done, recommended home health on discharge. High risk for fall.  Needs to be supervised 24 hours at home.  Should be very careful on  ambulation.  Moderate pericardial effusion: As per echo.  No IVC dilation.  Cardiology was consulted.  No evidence of tamponade.  Cardiology did not recommend pericardiocentesis.  Cardiology recommended to repeat echocardiogram in 3-4 weeks with follow-up at cardiology clinic.  Echo showed normal left ventricular function.  COVID positivity: Has been COVID-positive since April 18 at facility.  Outside 21 days  for precautions on 5/16.  He is asymptomatic.  Chest x-ray did not show pneumonia.  History of seizure disorder: On Keppra,depakote  Hypertension: Takes Cardizem at home, hypotensive on presentation  Antihypertensives on hold.  BPH: On Flomax at home which is on hold.  Failure to thrive: PT/OT recommended home health on discharge  Dementia: Currently at baseline.  Continue supportive care. .  On Namenda and donepezil  Goals of care: Palliative care was also consulted due to his multiple comorbidities, advanced age, dementia.  Palliative care discussed with the family.  Current plan is to continue full scope of treatment.    Discharge Diagnoses:  Principal Problem:   Syncope Active Problems:   Dyslipidemia   Essential hypertension   BPH with urinary obstruction   Dementia (HCC)   Seizure (HCC)   Pericardial effusion, acute   COVID   Palliative care by specialist   Goals of care, counseling/discussion   General weakness   Pressure injury of skin    Discharge Instructions  Discharge Instructions    Diet - low sodium heart healthy   Complete by: As directed    Discharge instructions   Complete by: As directed    1)Please take prescribed medications as instructed 2)Follow up with your PCP in a week 3)You will be called by cardiology for follow up appointment. You should do a  follow up echocardiogram in 3-4 weeks 4)Please be very careful while ambulating.You have very high risk of falling. You should be supervised 24 hours.Do not get up abruptly from sitting or  lying position.   Increase activity slowly   Complete by: As directed    No wound care   Complete by: As directed      Allergies as of 05/12/2021      Reactions   Chocolate    Itching if he eats too much chocolate    Ciprofloxacin    Achilles tendon pain    Lipitor [atorvastatin]    Muscle aches       Medication List    STOP taking these medications   diltiazem 120 MG 24 hr capsule Commonly known as: dilTIAZem CD   tamsulosin 0.4 MG Caps capsule Commonly known as: FLOMAX   terazosin 2 MG capsule Commonly known as: HYTRIN     TAKE these medications   Azopt 1 % ophthalmic suspension Generic drug: brinzolamide Place 1 drop into both eyes 3 (three) times daily.   bimatoprost 0.01 % Soln Commonly known as: LUMIGAN Place 1 drop into both eyes at bedtime.   cetirizine 10 MG tablet Commonly known as: ZYRTEC Take 10 mg by mouth daily as needed for allergies.   CO Q 10 PO Take 1 capsule by mouth daily.   divalproex 500 MG 24 hr tablet Commonly known as: Depakote ER Take 2 tablets (1,000 mg total) by mouth daily. Take 1 tablet every night What changed:   how much to take  how to take this  when to take this   donepezil 10 MG tablet Commonly known as: ARICEPT Take 20 mg by mouth at bedtime.   fish oil-omega-3 fatty acids 1000 MG capsule Take 1 g by mouth daily.   Glucosamine-Chondroitin 500-400 MG Caps Take 1 tablet by mouth daily.   levETIRAcetam 500 MG tablet Commonly known as: KEPPRA TAKE 1 TABLET (500 MG TOTAL) BY MOUTH TWO TIMES DAILY. What changed: how much to take   melatonin 5 MG Tabs Take 5 mg by mouth at bedtime.   memantine 10 MG tablet Commonly known as: NAMENDA Take 1 tablet twice a day What changed:   how much to take  how to take this  when to take this  additional instructions   midodrine 5 MG tablet Commonly known as: PROAMATINE Take 1 tablet (5 mg total) by mouth 3 (three) times daily with meals.   MILK THISTLE PO Take  1 capsule by mouth daily.   multivitamin with minerals tablet Take 1 tablet by mouth daily.   polyethylene glycol 17 g packet Commonly known as: MIRALAX / GLYCOLAX Take 17 g by mouth daily as needed for moderate constipation.   Rhopressa 0.02 % Soln Generic drug: Netarsudil Dimesylate Place 1 drop into both eyes at bedtime.   risperiDONE 0.5 MG tablet Commonly known as: RISPERDAL Take 1 tablet (0.5 mg total) by mouth at bedtime.       Follow-up Information    [name], MD. Schedule an appointment as soon as possible for a visit in 1 week(s).   Specialty: Family Medicine Contact information: [address] [place] [address] [phone]              Allergies  Allergen Reactions  . Chocolate     Itching if he eats too much chocolate   . Ciprofloxacin     Achilles tendon pain   . Lipitor [Atorvastatin]  Muscle aches     Consultations:  Cardiology,PCCM,palliaitve care   Procedures/Studies: DG Chest 2 View  Result Date: 05/04/2021 CLINICAL DATA:  Fall. EXAM: CHEST - 2 VIEW COMPARISON:  Prior chest radiographs 04/09/2021 and earlier. FINDINGS: Heart size within normal limits. Aortic atherosclerosis. Redemonstrated foci of scarring within the right lung base. No appreciable airspace consolidation. No evidence of pleural effusion or pneumothorax. No acute bony abnormality identified. IMPRESSION: No evidence of acute cardiopulmonary abnormality. Redemonstrated foci of scarring within the right lung base. Aortic Atherosclerosis (ICD10-I70.0). Electronically Signed   By: Jackey Loge DO   On: 05/04/2021 11:30   DG Ankle Complete Right  Result Date: 05/04/2021 CLINICAL DATA:  Pain and swelling.  Recent falls EXAM: RIGHT ANKLE - COMPLETE 3+ VIEW COMPARISON:  None. FINDINGS: Frontal, oblique, and lateral views were obtained. No acute fracture or joint effusion. There is joint space narrowing medially in the ankle region. There is an inferior calcaneal  spur with areas of plantar fascia calcification. There is spurring in the dorsal midfoot. Ankle mortise appears intact. There are scattered foci of atherosclerotic vascular calcification in the lower extremity. IMPRESSION: No acute fracture. Ankle mortise appears intact. Joint space narrowing medial ankle joint region. Inferior calcaneal spur with plantar fascia calcification. Foci of atherosclerotic arterial vascular disease. Electronically Signed   By: Bretta Bang III M.D.   On: 05/04/2021 14:50   CT Head Wo Contrast  Result Date: 05/07/2021 CLINICAL DATA:  Mental status change.  COVID positive patient. EXAM: CT HEAD WITHOUT CONTRAST TECHNIQUE: Contiguous axial images were obtained from the base of the skull through the vertex without intravenous contrast. COMPARISON:  Multiple prior exams, most recent head CT 3 days ago 05/04/2021 FINDINGS: Brain: Stable atrophy and chronic small vessel ischemia. No hemorrhage. No evidence of acute ischemia. Stable ventricular size and configuration. Dural based plaque-like calcification over lying the right frontotemporal lobe, unchanged from prior exams. No mass effect or midline shift. Vascular: No hyperdense vessel. Skull: No fracture or focal lesion. Sinuses/Orbits: Again seen mucosal thickening of ethmoid air cells and left frontal sinus. Mucous retention cyst left maxillary sinus. Other: None. IMPRESSION: 1. No acute intracranial abnormality. 2. Stable atrophy and chronic small vessel ischemia. 3. 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 This is unchanged from multiple prior exams. Electronically Signed   By: Narda Rutherford M.D.   On: 05/07/2021 17:06   CT Head Wo Contrast  Result Date: 05/04/2021 CLINICAL DATA:  Facial trauma. Neck trauma. Additional history provided: Frequent falls at home, unwitnessed. EXAM: CT HEAD WITHOUT CONTRAST CT CERVICAL SPINE WITHOUT CONTRAST TECHNIQUE:  Multidetector CT imaging of the head and cervical spine was performed following the standard protocol without intravenous contrast. Multiplanar CT image reconstructions of the cervical spine were also generated. COMPARISON:  Prior head CT examinations 05/01/2020 and earlier. CT cervical spine 04/09/2021. FINDINGS: CT HEAD FINDINGS Brain: Moderate cerebral atrophy. Commensurate prominence of the ventricles and sulci. Mild-to-moderate patchy and ill-defined hypoattenuation within the cerebral white matter, nonspecific but compatible with chronic small vessel ischemic disease. Grossly unchanged plaque-like calcification overlying the right frontotemporal lobes, likely reflecting a calcified meningioma. There is no acute intracranial hemorrhage. No demarcated cortical infarct. No extra-axial fluid collection. No midline shift. Vascular: No hyperdense vessel.  Atherosclerotic calcifications. Skull: Normal. Negative for fracture or focal lesion. Sinuses/Orbits: Visualized orbits show no acute finding. Postsurgical appearance of the paranasal sinuses. Moderate bilateral ethmoid sinus mucosal thickening. Mild mucosal thickening within an asymmetrically small right maxillary  sinus. Mild mucosal thickening and large mucous retention cyst within the left maxillary sinus. CT CERVICAL SPINE FINDINGS Alignment: Straightening of the expected cervical lordosis. No significant spondylolisthesis. Skull base and vertebrae: The basion-dental and atlanto-dental intervals are maintained.No evidence of acute fracture to the cervical spine. Soft tissues and spinal canal: No prevertebral fluid or swelling. No visible canal hematoma. Disc levels: Cervical spondylosis with multilevel disc space narrowing, disc bulges, endplate spurring, posterior disc osteophytes, uncovertebral hypertrophy and facet arthrosis. 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 Disc space narrowing is moderate to moderately  advanced at C2-C3, C3-C4, C4-C5, C5-C6 and C6-C7. Early fusion across the disc spaces at these levels not excluded. Multilevel spinal canal stenosis. Most notably, there is apparent moderate/severe spinal canal stenosis at C3-C4, C4-C5 and C5-C6. Multilevel bony neural foraminal narrowing. Degenerative changes are also present at the C1-C2 articulation. Multilevel ventral osteophytes. Upper chest: No consolidation within the imaged lung apices. No visible pneumothorax. IMPRESSION: CT head: 1. No evidence of acute intracranial abnormality. 2. Grossly unchanged plaque-like calcification overlying the right frontoparietal lobes, likely reflecting a calcified meningioma. 3. Stable moderate cerebral atrophy and mild-to-moderate cerebral white matter chronic small vessel ischemic disease. 4. Paranasal sinus disease, as described. CT cervical spine: 1. No evidence of acute fracture to the cervical spine. 2. Nonspecific straightening of the expected cervical lordosis. 3. 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 Multilevel spinal canal stenosis. Most notably, there is apparent moderate/severe spinal canal stenosis at C3-C4, C4-C5 and C5-C6. Multilevel bony neural foraminal narrowing. Electronically Signed   By: Jackey Loge DO   On: 05/04/2021 11:27   CT Head Wo Contrast  Result Date: 05/01/2021 CLINICAL DATA:  Altered mental status. EXAM: CT HEAD WITHOUT CONTRAST TECHNIQUE: Contiguous axial images were obtained from the base of the skull through the vertex without intravenous contrast. COMPARISON:  04/09/2021 FINDINGS: Brain: Stable moderately enlarged ventricles and subarachnoid spaces. Stable mild patchy white matter low density in both cerebral hemispheres. Stable right frontotemporal convexity dural ossification or plaque-like meningioma. No intracranial hemorrhage, mass lesion or CT evidence of acute infarction. Vascular: No hyperdense vessel or  unexpected calcification. Skull: Normal. Negative for fracture or focal lesion. Sinuses/Orbits: Status post bilateral cataract extraction. Stable post antrectomy changes on the right. Interval mild to moderate left maxillary sinus mucosal thickening with stable retention cysts. Stable mild right maxillary sinus mucosal thickening. Progressive bilateral ethmoid sinus mucosal thickening. Other: None. IMPRESSION: 1. No acute abnormality. 2. Stable atrophy and chronic small vessel white matter ischemic changes. 3. Stable frontotemporal dural ossification or plaque-like meningioma. Electronically Signed   By: Beckie Salts M.D.   On: 05/01/2021 14:32   CT Cervical Spine Wo Contrast  Result Date: 05/04/2021 CLINICAL DATA:  Facial trauma. Neck trauma. Additional history provided: Frequent falls at home, unwitnessed. EXAM: CT HEAD WITHOUT CONTRAST CT CERVICAL SPINE WITHOUT CONTRAST TECHNIQUE: Multidetector CT imaging of the head and cervical spine was performed following the standard protocol without intravenous contrast. Multiplanar CT image reconstructions of the cervical spine were also generated. COMPARISON:  Prior head CT examinations 05/01/2020 and earlier. CT cervical spine 04/09/2021. FINDINGS: CT HEAD FINDINGS Brain: Moderate cerebral atrophy. Commensurate prominence of the ventricles and sulci. Mild-to-moderate patchy and ill-defined hypoattenuation within the cerebral white matter, nonspecific but compatible with chronic small vessel ischemic disease. Grossly unchanged plaque-like calcification overlying the right frontotemporal lobes, likely reflecting a calcified meningioma. There is no acute intracranial hemorrhage. No demarcated cortical infarct. No extra-axial fluid  collection. No midline shift. Vascular: No hyperdense vessel.  Atherosclerotic calcifications. Skull: Normal. Negative for fracture or focal lesion. Sinuses/Orbits: Visualized orbits show no acute finding. Postsurgical appearance of the  paranasal sinuses. Moderate bilateral ethmoid sinus mucosal thickening. Mild mucosal thickening within an asymmetrically small right maxillary sinus. Mild mucosal thickening and large mucous retention cyst within the left maxillary sinus. CT CERVICAL SPINE FINDINGS Alignment: Straightening of the expected cervical lordosis. No significant spondylolisthesis. Skull base and vertebrae: The basion-dental and atlanto-dental intervals are maintained.No evidence of acute fracture to the cervical spine. Soft tissues and spinal canal: No prevertebral fluid or swelling. No visible canal hematoma. Disc levels: Cervical spondylosis with multilevel disc space narrowing, disc bulges, endplate spurring, posterior disc osteophytes, uncovertebral hypertrophy and facet arthrosis. 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 Disc space narrowing is moderate to moderately advanced at C2-C3, C3-C4, C4-C5, C5-C6 and C6-C7. Early fusion across the disc spaces at these levels not excluded. Multilevel spinal canal stenosis. Most notably, there is apparent moderate/severe spinal canal stenosis at C3-C4, C4-C5 and C5-C6. Multilevel bony neural foraminal narrowing. Degenerative changes are also present at the C1-C2 articulation. Multilevel ventral osteophytes. Upper chest: No consolidation within the imaged lung apices. No visible pneumothorax. IMPRESSION: CT head: 1. No evidence of acute intracranial abnormality. 2. Grossly unchanged plaque-like calcification overlying the right frontoparietal lobes, likely reflecting a calcified meningioma. 3. Stable moderate cerebral atrophy and mild-to-moderate cerebral white matter chronic small vessel ischemic disease. 4. Paranasal sinus disease, as described. CT cervical spine: 1. No evidence of acute fracture to the cervical spine. 2. Nonspecific straightening of the expected cervical lordosis. 3. Overall advanced cervical spondylosis with multilevel  ossification of the posterior longitudinal ligament. Multilevel spinal canal stenosis. Most notably, there is apparent moderate/severe spinal canal stenosis at C3-C4, C4-C5 and C5-C6. Multilevel bony neural foraminal narrowing. Electronically Signed   By: Jackey Loge DO   On: 05/04/2021 11:27   DG Chest Port 1 View  Result Date: 05/07/2021 CLINICAL DATA:  Altered mental status EXAM: PORTABLE CHEST 1 VIEW COMPARISON:  Chest x-rays dated 05/04/2021 and 04/09/2021. FINDINGS: Heart size and mediastinal contours are stable. Lungs are clear. No pleural effusion or pneumothorax is seen. Osseous structures about the chest are unremarkable. IMPRESSION: No active disease. No evidence of pneumonia or pulmonary edema. Electronically Signed   By: Bary Richard M.D.   On: 05/07/2021 14:20   ECHOCARDIOGRAM COMPLETE  Result Date: 05/08/2021    ECHOCARDIOGRAM REPORT   Patient Name:   WILBERTO CONSOLE Date of Exam: 05/08/2021 Medical Rec #:  [id number]        Height:       73.0 in Accession #:    [id number]       Weight:       157.4 lb Date of Birth:  [birth year]        BSA:          1.943 m Patient Age:    85 years         BP:           162/82 mmHg Patient Gender: M                HR:           55 bpm. Exam Location:  Inpatient Procedure: 2D Echo, Cardiac Doppler and Color Doppler Indications:    R55 Syncope  History:        Patient has no prior history of Echocardiogram examinations.  Risk Factors:Hypertension and Dyslipidemia. COVID-19 Positive.  Sonographer:    Elmarie Shiley Dance Referring Phys: [id number] Cecille Po MELVIN IMPRESSIONS  1. Left ventricular ejection fraction, by estimation, is 65 to 70%. The left ventricle has normal function. The left ventricle has no regional wall motion abnormalities. There is moderate asymmetric left ventricular hypertrophy of the septal segment. Left ventricular diastolic parameters are indeterminate.  2. Right ventricular systolic function is normal. The right ventricular size  is normal. There is normal pulmonary artery systolic pressure. The estimated right ventricular systolic pressure is 19.2 mmHg.  3. Early diastolic compression of RV free wall and RA without obvious respiratory variation in mitral outflow. Tamponade is not suggested, but effusion has potential for hemodynamic significance and should be followed. Consider limited echocardiogram in  the next 48-72 hours. Moderate pericardial effusion. The pericardial effusion is circumferential, but largest anteriorly.  4. The mitral valve is grossly normal. Trivial mitral valve regurgitation.  5. The aortic valve is tricuspid. Aortic valve regurgitation is mild. Mild to moderate aortic valve sclerosis/calcification is present, without any evidence of aortic stenosis. Aortic regurgitation PHT measures 620 msec.  6. Aortic dilatation noted. There is mild dilatation of the ascending aorta, measuring 42 mm.  7. The inferior vena cava is normal in size with greater than 50% respiratory variability, suggesting right atrial pressure of 3 mmHg. FINDINGS  Left Ventricle: Left ventricular ejection fraction, by estimation, is 65 to 70%. The left ventricle has normal function. The left ventricle has no regional wall motion abnormalities. The left ventricular internal cavity size was normal in size. There is  moderate asymmetric left ventricular hypertrophy of the septal segment. Left ventricular diastolic parameters are indeterminate. Right Ventricle: The right ventricular size is normal. No increase in right ventricular wall thickness. Right ventricular systolic function is normal. There is normal pulmonary artery systolic pressure. The tricuspid regurgitant velocity is 2.01 m/s, and  with an assumed right atrial pressure of 3 mmHg, the estimated right ventricular systolic pressure is 19.2 mmHg. Left Atrium: Left atrial size was normal in size. Right Atrium: Right atrial size was normal in size. Pericardium: Early diastolic compression of RV  free wall and RA without obvious respiratory variation in mitral outflow. Tamponade is not suggested, but effusion has potential for hemodynamic significance and should be followed. Consider limited echocardiogram in the next 48-72 hours. A moderately sized pericardial effusion is present. The pericardial effusion is circumferential. Mitral Valve: The mitral valve is grossly normal. Trivial mitral valve regurgitation. Tricuspid Valve: The tricuspid valve is grossly normal. Tricuspid valve regurgitation is trivial. Aortic Valve: The aortic valve is tricuspid. There is mild aortic valve annular calcification. Aortic valve regurgitation is mild. Aortic regurgitation PHT measures 620 msec. Mild to moderate aortic valve sclerosis/calcification is present, without any evidence of aortic stenosis. Pulmonic Valve: The pulmonic valve was grossly normal. Pulmonic valve regurgitation is trivial. Aorta: Aortic dilatation noted. There is mild dilatation of the ascending aorta, measuring 42 mm. Venous: The inferior vena cava is normal in size with greater than 50% respiratory variability, suggesting right atrial pressure of 3 mmHg. IAS/Shunts: No atrial level shunt detected by color flow Doppler.  LEFT VENTRICLE PLAX 2D LVIDd:         4.50 cm Diastology LVIDs:         2.40 cm LV e' medial:    4.03 cm/s LV PW:         1.00 cm LV E/e' medial:  17.0 LV IVS:  1.60 cm LV e' lateral:   5.55 cm/s                        LV E/e' lateral: 12.4  RIGHT VENTRICLE             IVC RV Basal diam:  2.90 cm     IVC diam: 1.70 cm RV S prime:     14.10 cm/s TAPSE (M-mode): 1.8 cm LEFT ATRIUM             Index       RIGHT ATRIUM           Index LA diam:        3.50 cm 1.80 cm/m  RA Area:     11.20 cm LA Vol (A2C):   52.4 ml 26.96 ml/m RA Volume:   20.70 ml  10.65 ml/m LA Vol (A4C):   31.9 ml 16.41 ml/m LA Biplane Vol: 41.6 ml 21.41 ml/m  AORTIC VALVE LVOT Vmax:   99.10 cm/s LVOT Vmean:  64.200 cm/s LVOT VTI:    0.209 m AI PHT:      620  msec  AORTA Ao Asc diam: 4.20 cm MITRAL VALVE               TRICUSPID VALVE MV Area (PHT): 2.75 cm    TR Peak grad:   16.2 mmHg MV Decel Time: 276 msec    TR Vmax:        201.00 cm/s MV E velocity: 68.60 cm/s MV A velocity: 74.70 cm/s  SHUNTS MV E/A ratio:  0.92        Systemic VTI: 0.21 m Nona DellSamuel Mcdowell MD Electronically signed by Nona DellSamuel Mcdowell MD Signature Date/Time: 05/08/2021/12:56:36 PM    Final    DG Hip Unilat W or Wo Pelvis 2-3 Views Right  Result Date: 05/04/2021 CLINICAL DATA:  Frequent falls. Fell at home 3 times in the last week. EXAM: DG HIP (WITH OR WITHOUT PELVIS) 2-3V RIGHT COMPARISON:  04/09/2021 FINDINGS: There are degenerative changes in both hips. No acute fracture or subluxation. Degenerative changes are seen in the LOWER spine. IMPRESSION: No evidence for acute  abnormality. Electronically Signed   By: Norva PavlovElizabeth  Brown M.D.   On: 05/04/2021 11:17   VAS US LOWER EXTREMITY VENOUS (DVT)  Result Date: 05/08/2021  Lower Venous DVT Study Patient Name:  Natasha BenceJoe L Quintin  Date of Exam:   05/08/2021 Medical Rec #: [id number]         Accession #:    [id number] Date of Birth: Feb 03, 1935         Patient Gender: M Patient Age:   85Y [facility]am [facility] Procedure:      VAS US LOWER EXTREMITY VENOUS (DVT) Referring Phys: [id number] FANG XU --------------------------------------------------------------------------------  Indications: Edema, and COVID+.  Comparison Study: No previous exams Performing Technologist: Ernestene MentionJody Hill  Examination Guidelines: A complete evaluation includes B-mode imaging, spectral Doppler, color Doppler, and power Doppler as needed of all accessible portions of each vessel. Bilateral testing is considered an integral part of a complete examination. Limited examinations for reoccurring indications may be performed as noted. The reflux portion of the exam is performed with the patient in reverse Trendelenburg.   +---------+---------------+---------+-----------+----------+--------------+ RIGHT    CompressibilityPhasicitySpontaneityPropertiesThrombus Aging +---------+---------------+---------+-----------+----------+--------------+ CFV      Full           Yes      Yes                                 +---------+---------------+---------+-----------+----------+--------------+  SFJ      Full                                                        +---------+---------------+---------+-----------+----------+--------------+ FV Prox  Full           Yes      Yes                                 +---------+---------------+---------+-----------+----------+--------------+ FV Mid   Full           Yes      Yes                                 +---------+---------------+---------+-----------+----------+--------------+ FV DistalFull           Yes      Yes                                 +---------+---------------+---------+-----------+----------+--------------+ PFV      Full                                                        +---------+---------------+---------+-----------+----------+--------------+ POP      Full           Yes      Yes                                 +---------+---------------+---------+-----------+----------+--------------+ PTV      Full                                                        +---------+---------------+---------+-----------+----------+--------------+ PERO     Full                                                        +---------+---------------+---------+-----------+----------+--------------+   +---------+---------------+---------+-----------+----------+--------------+ LEFT     CompressibilityPhasicitySpontaneityPropertiesThrombus Aging +---------+---------------+---------+-----------+----------+--------------+ CFV      Full           Yes      Yes                                  +---------+---------------+---------+-----------+----------+--------------+ SFJ      Full                                                        +---------+---------------+---------+-----------+----------+--------------+  FV Prox  Full           Yes      Yes                                 +---------+---------------+---------+-----------+----------+--------------+ FV Mid   Full           Yes      Yes                                 +---------+---------------+---------+-----------+----------+--------------+ FV DistalFull           Yes      Yes                                 +---------+---------------+---------+-----------+----------+--------------+ PFV      Full                                                        +---------+---------------+---------+-----------+----------+--------------+ POP      Full           Yes      Yes                                 +---------+---------------+---------+-----------+----------+--------------+ PTV      Full                                                        +---------+---------------+---------+-----------+----------+--------------+ PERO     Full                                                        +---------+---------------+---------+-----------+----------+--------------+     Summary: BILATERAL: - No evidence of deep vein thrombosis seen in the lower extremities, bilaterally. - RIGHT: - A cystic structure is found in the popliteal fossa.  LEFT: - A cystic structure is found in the popliteal fossa.  *See table(s) above for measurements and observations. Electronically signed by Lemar Livings MD on 05/08/2021 at 11:05:11 AM.    Final    ECHOCARDIOGRAM LIMITED  Result Date: 05/09/2021    ECHOCARDIOGRAM LIMITED REPORT   Patient Name:   CHINO SARDO Date of Exam: 05/09/2021 Medical Rec #:  [id number]        Height:       73.0 in Accession #:    [id number]       Weight:       155.2 lb Date of Birth:  1935-04-23         BSA:          1.932 m Patient Age:    85 years         BP:           102/67 mmHg Patient Gender: M  HR:           610 bpm. Exam Location:  Inpatient Procedure: Limited Echo, Color Doppler and Cardiac Doppler STAT ECHO Indications:    Pericardial effusion I31.3  History:        Patient has prior history of Echocardiogram examinations, most                 recent 05/08/2021. Hypertension and Dyslipidemia. COVID-19.                 Dementia.  Sonographer:    Leta Jungling RDCS Referring Phys: [id number] FANG XU IMPRESSIONS  1. Limited study to fu pericardial effusion; full doppler not performed; moderate pericardial effusion with mild RA collapse; IVC upper normal but collapses; no diagnostic flow variation across MV; suggest FU echos and clinical correlation.  2. Left ventricular ejection fraction, by estimation, is 55 to 60%. The left ventricle has normal function.  3. Right ventricular systolic function is normal. The right ventricular size is normal.  4. Moderate pericardial effusion.  5. Aortic dilatation noted. There is mild dilatation of the ascending aorta, measuring 42 mm. FINDINGS  Left Ventricle: Left ventricular ejection fraction, by estimation, is 55 to 60%. The left ventricle has normal function. Right Ventricle: The right ventricular size is normal. Right ventricular systolic function is normal. Left Atrium: Left atrial size was normal in size. Right Atrium: Right atrial size was normal in size. Pericardium: A moderately sized pericardial effusion is present. Aorta: Aortic dilatation noted. There is mild dilatation of the ascending aorta, measuring 42 mm. Additional Comments: Limited study to fu pericardial effusion; full doppler not performed; moderate pericardial effusion with mild RA collapse; IVC upper normal but collapses; no diagnostic flow variation across MV; suggest FU echos and clinical correlation. LEFT VENTRICLE PLAX 2D LVOT diam:     2.00 cm LV SV:         58 LV SV Index:   30  LVOT Area:     3.14 cm                         3D Volume EF:                         3D EF:        57 %                         LV EDV:       88 ml                         LV ESV:       38 ml                         LV SV:        50 ml AORTIC VALVE LVOT Vmax:   94.20 cm/s LVOT Vmean:  57.900 cm/s LVOT VTI:    0.185 m  SHUNTS Systemic VTI:  0.18 m Systemic Diam: 2.00 cm Olga Millers MD Electronically signed by Olga Millers MD Signature Date/Time: 05/09/2021/12:31:56 PM    Final        Subjective: Patient seen and examined at the bedside this morning.  Medically stable for discharge today.  Discharge Exam: Vitals:   05/11/21 2029 05/12/21 0553  BP: (!) 171/87 (!) 158/79  Pulse: (!) 55 (!) 54  Resp: 20 14  Temp: (!) 97.5 F (36.4 C)   SpO2: 100% 99%   Vitals:   05/11/21 1256 05/11/21 2029 05/12/21 0500 05/12/21 0553  BP: 126/79 (!) 171/87  (!) 158/79  Pulse: 72 (!) 55  (!) 54  Resp: 20 20  14   Temp: 97.8 F (36.6 C) (!) 97.5 F (36.4 C)    TempSrc:  Oral    SpO2: 98% 100%  99%  Weight:   72.9 kg     General: Pt is alert, awake, not in acute distress Cardiovascular: RRR, S1/S2 +, no rubs, no gallops Respiratory: CTA bilaterally, no wheezing, no rhonchi Abdominal: Soft, NT, ND, bowel sounds + Extremities: no edema, no cyanosis    The results of significant diagnostics from this hospitalization (including imaging, microbiology, ancillary and laboratory) are listed below for reference.     Microbiology: Recent Results (from the past 240 hour(s))  Culture, blood (Routine X 2) w Reflex to ID Panel     Status: None   Collection Time: 05/07/21  1:13 PM   Specimen: BLOOD  Result Value Ref Range Status   Specimen Description   Final    BLOOD RIGHT ANTECUBITAL Performed at [facility], [address], [place], [place] [address]    Special Requests   Final    BOTTLES DRAWN AEROBIC AND ANAEROBIC Blood Culture results may not be optimal due to an  inadequate volume of blood received in culture bottles Performed at [facility], [address] [place], [place] [address]    Culture   Final    NO GROWTH 5 DAYS Performed at [facility], [address] [place], [place] [address]    Report Status 05/12/2021 FINAL  Final  Culture, blood (Routine X 2) w Reflex to ID Panel     Status: None   Collection Time: 05/07/21  1:14 PM   Specimen: BLOOD  Result Value Ref Range Status   Specimen Description   Final    BLOOD LEFT ANTECUBITAL Performed at [facility], [address], [place], [place] [address]    Special Requests   Final    BOTTLES DRAWN AEROBIC AND ANAEROBIC Blood Culture results may not be optimal due to an inadequate volume of blood received in culture bottles Performed at [facility], [address] [place], [place] [address]    Culture   Final    NO GROWTH 5 DAYS Performed at [facility], [address], [place], [place] [address]    Report Status 05/12/2021 FINAL  Final  Resp Panel by RT-PCR (Flu A&B, Covid) Nasopharyngeal Swab     Status: Abnormal   Collection Time: 05/07/21  1:37 PM   Specimen: Nasopharyngeal Swab; Nasopharyngeal(NP) swabs in vial transport medium  Result Value Ref Range Status   SARS Coronavirus 2 by RT PCR POSITIVE (A) NEGATIVE Final    Comment: (NOTE) SARS-CoV-2 target nucleic acids are DETECTED.  The SARS-CoV-2 RNA is generally detectable in upper respiratory specimens during the acute phase of infection. Positive results are indicative of the presence of the identified virus, but do not rule out bacterial infection or co-infection with other pathogens not detected by the test. Clinical correlation with patient history and other diagnostic information is necessary to determine patient infection status. The expected result is Negative.  Fact Sheet for Patients: BloggerCourse.com  Fact Sheet for  Healthcare Providers: SeriousBroker.it  This test is not yet approved or cleared by the [place] FDA and  has been authorized for detection and/or diagnosis of SARS-CoV-2 by FDA under an Emergency Use Authorization (EUA).  This EUA will remain in effect (meaning this test can be used) for the duration of  the COVID-19 declaration under Section 564(b)(1) of the A ct, 21 U.S.C. section 360bbb-3(b)(1), unless the authorization is terminated or revoked sooner.     Influenza A by PCR NEGATIVE NEGATIVE Final   Influenza B by PCR NEGATIVE NEGATIVE Final    Comment: (NOTE) The Xpert Xpress SARS-CoV-2/FLU/RSV plus assay is intended as an aid in the diagnosis of influenza from Nasopharyngeal swab specimens and should not be used as a sole basis for treatment. Nasal washings and aspirates are unacceptable for Xpert Xpress SARS-CoV-2/FLU/RSV testing.  Fact Sheet for Patients: BloggerCourse.com  Fact Sheet for Healthcare Providers: SeriousBroker.it  This test is not yet approved or cleared by the [place] FDA and has been authorized for detection and/or diagnosis of SARS-CoV-2 by FDA under an Emergency Use Authorization (EUA). This EUA will remain in effect (meaning this test can be used) for the duration of the COVID-19 declaration under Section 564(b)(1) of the Act, 21 U.S.C. section 360bbb-3(b)(1), unless the authorization is terminated or revoked.  Performed at [facility], [address], [place], [place] [address]   Urine Culture     Status: None   Collection Time: 05/07/21  2:33 PM   Specimen: Urine, Catheterized  Result Value Ref Range Status   Specimen Description   Final    URINE, CATHETERIZED Performed at [facility], [address], [place], [place] [address]    Special Requests   Final    NONE Performed at [facility], [address], [place], [place] [address]    Culture   Final    NO GROWTH Performed at [facility], [address], [place], [place] [address]    Report Status 05/09/2021 FINAL  Final  MRSA PCR Screening     Status: None   Collection Time: 05/09/21 12:39 PM   Specimen: Nasal Mucosa; Nasopharyngeal  Result Value Ref Range Status   MRSA by PCR NEGATIVE NEGATIVE Final    Comment:        The GeneXpert MRSA Assay (FDA approved for NASAL specimens only), is one component of a comprehensive MRSA colonization surveillance program. It is not intended to diagnose MRSA infection nor to guide or monitor treatment for MRSA infections. Performed at [facility], [address] [place], [place] [address]      Labs: BNP (last 3 results) Recent Labs    05/09/21 0232  BNP 114.1*   Basic Metabolic Panel: Recent Labs  Lab 05/07/21 1312 05/08/21 0350 05/09/21 0232 05/10/21 0720  NA 139 142 141 139  K 3.8 3.7 3.6 3.7  CL 104 107 107 107  CO2 29 30 27 27   GLUCOSE 188* 116* 100* 80  BUN 15 9 9 9   CREATININE 0.92 0.65 0.62 0.73  CALCIUM 8.7* 8.9 9.1 8.6*  MG  --   --  2.1 1.9   Liver Function Tests: Recent Labs  Lab 05/07/21 1312 05/08/21 0350 05/10/21 0720  AST 35 34 29  ALT 30 29 24   ALKPHOS 49 54 55  BILITOT 0.4 0.4 0.7  PROT 5.5* 5.7* 5.4*  ALBUMIN 3.0* 3.1* 2.9*   Recent Labs  Lab 05/07/21 1312  LIPASE 26   No results for input(s): AMMONIA in the last 168 hours. CBC: Recent  Labs  Lab 05/07/21 1312 05/08/21 0350 05/09/21 0232 05/10/21 0720  WBC 3.9* 4.2 4.1 4.0  NEUTROABS 2.6  --  2.4 2.3  HGB 11.9* 12.7* 13.1 12.5*  HCT 39.1 41.4 43.0 40.7  MCV 85.4 85.5 85.7 85.1  PLT 231 242 271 240   Cardiac Enzymes: No results for input(s): CKTOTAL, CKMB, CKMBINDEX, TROPONINI in the last 168 hours. BNP: Invalid input(s): POCBNP CBG: No results for input(s): GLUCAP in the last 168 hours. D-Dimer No results for input(s): DDIMER in the last  72 hours. Hgb A1c No results for input(s): HGBA1C in the last 72 hours. Lipid Profile No results for input(s): CHOL, HDL, LDLCALC, TRIG, CHOLHDL, LDLDIRECT in the last 72 hours. Thyroid function studies No results for input(s): TSH, T4TOTAL, T3FREE, THYROIDAB in the last 72 hours.  Invalid input(s): FREET3 Anemia work up No results for input(s): VITAMINB12, FOLATE, FERRITIN, TIBC, IRON, RETICCTPCT in the last 72 hours. Urinalysis    Component Value Date/Time   COLORURINE AMBER (A) 05/07/2021 1433   APPEARANCEUR CLEAR 05/07/2021 1433   LABSPEC 1.023 05/07/2021 1433   PHURINE 6.0 05/07/2021 1433   GLUCOSEU NEGATIVE 05/07/2021 1433   GLUCOSEU NEGATIVE 02/06/2020 1032   HGBUR SMALL (A) 05/07/2021 1433   HGBUR negative 09/16/2010 0839   BILIRUBINUR NEGATIVE 05/07/2021 1433   BILIRUBINUR neg 02/22/2019 0958   KETONESUR 5 (A) 05/07/2021 1433   PROTEINUR NEGATIVE 05/07/2021 1433   UROBILINOGEN 0.2 02/06/2020 1032   NITRITE NEGATIVE 05/07/2021 1433   LEUKOCYTESUR NEGATIVE 05/07/2021 1433   Sepsis Labs Invalid input(s): PROCALCITONIN,  WBC,  LACTICIDVEN Microbiology Recent Results (from the past 240 hour(s))  Culture, blood (Routine X 2) w Reflex to ID Panel     Status: None   Collection Time: 05/07/21  1:13 PM   Specimen: BLOOD  Result Value Ref Range Status   Specimen Description   Final    BLOOD RIGHT ANTECUBITAL Performed at [facility], [address], [place], [place] [address]    Special Requests   Final    BOTTLES DRAWN AEROBIC AND ANAEROBIC Blood Culture results may not be optimal due to an inadequate volume of blood received in culture bottles Performed at [facility], [address] [place], [place] [address]    Culture   Final    NO GROWTH 5 DAYS Performed at [facility], [address], [place], [place] [address]    Report Status 05/12/2021 FINAL  Final  Culture, blood (Routine X 2) w Reflex to ID Panel     Status:  None   Collection Time: 05/07/21  1:14 PM   Specimen: BLOOD  Result Value Ref Range Status   Specimen Description   Final    BLOOD LEFT ANTECUBITAL Performed at [facility], [address], [place], [place] [address]    Special Requests   Final    BOTTLES DRAWN AEROBIC AND ANAEROBIC Blood Culture results may not be optimal due to an inadequate volume of blood received in culture bottles Performed at [facility], [address], [place], [place] [address]    Culture   Final    NO GROWTH 5 DAYS Performed at [facility], [address], [place], [place] [address]    Report Status 05/12/2021 FINAL  Final  Resp Panel by RT-PCR (Flu A&B, Covid) Nasopharyngeal Swab     Status: Abnormal   Collection Time: 05/07/21  1:37 PM   Specimen: Nasopharyngeal Swab; Nasopharyngeal(NP) swabs in  vial transport medium  Result Value Ref Range Status   SARS Coronavirus 2 by RT PCR POSITIVE (A) NEGATIVE Final    Comment: (NOTE) SARS-CoV-2 target nucleic acids are DETECTED.  The SARS-CoV-2 RNA is generally detectable in upper respiratory specimens during the acute phase of infection. Positive results are indicative of the presence of the identified virus, but do not rule out bacterial infection or co-infection with other pathogens not detected by the test. Clinical correlation with patient history and other diagnostic information is necessary to determine patient infection status. The expected result is Negative.  Fact Sheet for Patients: BloggerCourse.com  Fact Sheet for Healthcare Providers: SeriousBroker.it  This test is not yet approved or cleared by the [place] FDA and  has been authorized for detection and/or diagnosis of SARS-CoV-2 by FDA under an Emergency Use Authorization (EUA).  This EUA will remain in effect (meaning this test can be used) for the duration of  the COVID-19 declaration  under Section 564(b)(1) of the A ct, 21 U.S.C. section 360bbb-3(b)(1), unless the authorization is terminated or revoked sooner.     Influenza A by PCR NEGATIVE NEGATIVE Final   Influenza B by PCR NEGATIVE NEGATIVE Final    Comment: (NOTE) The Xpert Xpress SARS-CoV-2/FLU/RSV plus assay is intended as an aid in the diagnosis of influenza from Nasopharyngeal swab specimens and should not be used as a sole basis for treatment. Nasal washings and aspirates are unacceptable for Xpert Xpress SARS-CoV-2/FLU/RSV testing.  Fact Sheet for Patients: BloggerCourse.com  Fact Sheet for Healthcare Providers: SeriousBroker.it  This test is not yet approved or cleared by the [place] FDA and has been authorized for detection and/or diagnosis of SARS-CoV-2 by FDA under an Emergency Use Authorization (EUA). This EUA will remain in effect (meaning this test can be used) for the duration of the COVID-19 declaration under Section 564(b)(1) of the Act, 21 U.S.C. section 360bbb-3(b)(1), unless the authorization is terminated or revoked.  Performed at [facility], [address], [place], [place] [address]   Urine Culture     Status: None   Collection Time: 05/07/21  2:33 PM   Specimen: Urine, Catheterized  Result Value Ref Range Status   Specimen Description   Final    URINE, CATHETERIZED Performed at [facility], [address] [place], [place] [address]    Special Requests   Final    NONE Performed at [facility], [address], [place], [place] [address]    Culture   Final    NO GROWTH Performed at [facility], [address] [place], [place] [address]    Report Status 05/09/2021 FINAL  Final  MRSA PCR Screening     Status: None   Collection Time: 05/09/21 12:39 PM   Specimen: Nasal Mucosa; Nasopharyngeal  Result Value Ref Range Status   MRSA by PCR NEGATIVE  NEGATIVE Final    Comment:        The GeneXpert MRSA Assay (FDA approved for NASAL specimens only), is one component of a comprehensive MRSA colonization surveillance program. It is not intended to diagnose MRSA infection nor to guide or monitor treatment for MRSA infections. Performed at [facility], [address], [place], [place] [address]     Please note: You were cared for by a hospitalist during your hospital stay. Once you are discharged, your primary care physician will handle any further medical issues. Please note that NO REFILLS for any discharge medications will  be authorized once you are discharged, as it is imperative that you return to your primary care physician (or establish a relationship with a primary care physician if you do not have one) for your post hospital discharge needs so that they can reassess your need for medications and monitor your lab values.    Time coordinating discharge: 40 minutes  SIGNED:   [name], MD  [facility] 05/12/2021, 12:36 PM Pager [phone]  If 7PM-7AM, please contact night-coverage [email or web address] Password [email or web address]

## 2021-05-12 NOTE — Telephone Encounter (Signed)
Per [facility] nurse Kenney Houseman pt was hospitalized for covid. He has been discharged from the hospital. The nurse would like to know if they can resume home health services and can she have a Child psychotherapist added to the order; please call [phone] - Tanya.

## 2021-05-12 NOTE — Care Management Important Message (Signed)
Important Message  Patient Details IM Letter given to given to the Patient. Name: [name] [name] MRN: [id number] Date of Birth: Jul 08, 1935   Medicare Important Message Given:  Yes     [name] 05/12/2021, 11:00 AM

## 2021-05-12 NOTE — Progress Notes (Signed)
Patient's son, Tyson Babinski, updated that [facility] is here picking up patient currently. Pt's son aware that pt is wearing a paper gown and condom cath at D/C per patient request. Pt's son familiar with condom cath and agreed. All belongings and paperwork/ D/C instructions sent with patient.

## 2021-05-13 ENCOUNTER — Telehealth: Payer: Self-pay | Admitting: Family Medicine

## 2021-05-13 ENCOUNTER — Telehealth: Payer: Self-pay | Admitting: Neurology

## 2021-05-13 NOTE — Telephone Encounter (Signed)
Please call has questions about medication

## 2021-05-13 NOTE — Telephone Encounter (Signed)
Please okay all these orders

## 2021-05-13 NOTE — Telephone Encounter (Signed)
Patient wife is calling and wanted to see if provider still wanted patient to take DILT- XR 240 mg. Medication is not on med list, please advise. CB  [phone]

## 2021-05-13 NOTE — Telephone Encounter (Signed)
Spoke with pt wife and informed her that I had talked with Dr Everlena Cooper and we went over his medication and looked over his chart pt is to only take divalproex 500mg  take one tablet once daily at night. Pt is no longer to take levETIRAcetam 500mg  BID, PT wife verbalized understanding

## 2021-05-13 NOTE — Telephone Encounter (Signed)
Called spoke with Kenney Houseman, given verbal okay.

## 2021-05-13 NOTE — Telephone Encounter (Signed)
Pt wife called will call her back Dr Everlena Cooper helping me work on pt medication

## 2021-05-14 NOTE — Telephone Encounter (Signed)
He should NOT be taking Diltiazem

## 2021-05-14 NOTE — Progress Notes (Signed)
I was notified by [facility] administration that patient's wife [name] [name]  had a lot of questions and she has hard time taking care of the patient at home.  I informed patient's wife that I had discussed extensively for 2 days with her son about the discharge planning.  We had offered an option for  placement in the skilled nursing facility on discharge but patient's son wanted to take him home with home health.  Patient's wife also was asking about palliative care follow-up I explained her that we had discussed goals of care during this hospitalization and her son and herself wanted to continue full scope of treatment for him and he remained full code on DC. Wife also expressed frustation that home health hasnot come their home yet.I   had a long discussion with the wife today and told her that if she has a hard time taking care at home,she needs to bring her back to ER with an option for SNF placement.   05/14/21. 9:41 pm

## 2021-05-14 NOTE — Telephone Encounter (Signed)
Spoke with pt wife verbalized understanding. Advised pt wife that I will call [facility] to see what requirements are needed for pt to get a full rail bed

## 2021-05-15 ENCOUNTER — Emergency Department (HOSPITAL_COMMUNITY)
Admission: EM | Admit: 2021-05-15 | Discharge: 2021-05-16 | Disposition: A | Discharge status: Home / Self Care | Payer: Medicare Other | Attending: Emergency Medicine | Admitting: Emergency Medicine

## 2021-05-15 ENCOUNTER — Encounter (HOSPITAL_COMMUNITY): Payer: Self-pay

## 2021-05-15 DIAGNOSIS — Z79899 Other long term (current) drug therapy: Secondary | ICD-10-CM | POA: Diagnosis not present

## 2021-05-15 DIAGNOSIS — Z8616 Personal history of COVID-19: Secondary | ICD-10-CM | POA: Insufficient documentation

## 2021-05-15 DIAGNOSIS — F0390 Unspecified dementia without behavioral disturbance: Secondary | ICD-10-CM | POA: Insufficient documentation

## 2021-05-15 DIAGNOSIS — R627 Adult failure to thrive: Secondary | ICD-10-CM | POA: Insufficient documentation

## 2021-05-15 DIAGNOSIS — Z87891 Personal history of nicotine dependence: Secondary | ICD-10-CM | POA: Insufficient documentation

## 2021-05-15 DIAGNOSIS — I10 Essential (primary) hypertension: Secondary | ICD-10-CM | POA: Diagnosis not present

## 2021-05-15 LAB — COMPREHENSIVE METABOLIC PANEL WITH GFR
ALT: 27 U/L (ref 0–44)
AST: 38 U/L (ref 15–41)
Albumin: 2.9 g/dL — ABNORMAL LOW (ref 3.5–5.0)
Alkaline Phosphatase: 54 U/L (ref 38–126)
Anion gap: 8 (ref 5–15)
BUN: 15 mg/dL (ref 8–23)
CO2: 27 mmol/L (ref 22–32)
Calcium: 8.7 mg/dL — ABNORMAL LOW (ref 8.9–10.3)
Chloride: 103 mmol/L (ref 98–111)
Creatinine, Ser: 0.91 mg/dL (ref 0.61–1.24)
GFR, Estimated: >60 mL/min
Glucose, Bld: 83 mg/dL (ref 70–99)
Potassium: 4.3 mmol/L (ref 3.5–5.1)
Sodium: 138 mmol/L (ref 135–145)
Total Bilirubin: 0.6 mg/dL (ref 0.3–1.2)
Total Protein: 5.8 g/dL — ABNORMAL LOW (ref 6.5–8.1)

## 2021-05-15 LAB — CBC WITH DIFFERENTIAL/PLATELET
Abs Immature Granulocytes: 0.02 K/uL (ref 0.00–0.07)
Basophils Absolute: 0 K/uL (ref 0.0–0.1)
Basophils Relative: 1 %
Eosinophils Absolute: 0.5 K/uL (ref 0.0–0.5)
Eosinophils Relative: 11 %
HCT: 43.6 % (ref 39.0–52.0)
Hemoglobin: 13.4 g/dL (ref 13.0–17.0)
Immature Granulocytes: 1 %
Lymphocytes Relative: 14 %
Lymphs Abs: 0.6 K/uL — ABNORMAL LOW (ref 0.7–4.0)
MCH: 26.2 pg (ref 26.0–34.0)
MCHC: 30.7 g/dL (ref 30.0–36.0)
MCV: 85.3 fL (ref 80.0–100.0)
Monocytes Absolute: 1 K/uL (ref 0.1–1.0)
Monocytes Relative: 25 %
Neutro Abs: 2.1 K/uL (ref 1.7–7.7)
Neutrophils Relative %: 48 %
Platelets: 249 K/uL (ref 150–400)
RBC: 5.11 MIL/uL (ref 4.22–5.81)
RDW: 13.5 % (ref 11.5–15.5)
WBC: 4.2 K/uL (ref 4.0–10.5)
nRBC: 0 % (ref 0.0–0.2)

## 2021-05-15 LAB — URINALYSIS, ROUTINE W REFLEX MICROSCOPIC
Bilirubin Urine: NEGATIVE
Glucose, UA: NEGATIVE mg/dL
Hgb urine dipstick: NEGATIVE
Ketones, ur: 20 mg/dL — AB
Leukocytes,Ua: NEGATIVE
Nitrite: NEGATIVE
Protein, ur: NEGATIVE mg/dL
Specific Gravity, Urine: 1.018 (ref 1.005–1.030)
pH: 7 (ref 5.0–8.0)

## 2021-05-15 NOTE — ED Triage Notes (Addendum)
Brought in by EMS for failure to thrive. As per EMS, pts wife called doctor saying they want palliative care for patient, and was told to send him to ED. Pt A&O to self at baseline, denies any pain.

## 2021-05-15 NOTE — ED Notes (Signed)
Spoke to stephanie case management who is discussing plan of care for home health with pts wife at home.

## 2021-05-15 NOTE — ED Provider Notes (Signed)
[facility]-EMERGENCY DEPT Provider Note   CSN: [id number] Arrival date & time: 05/15/21  1540     History Chief Complaint  Patient presents with  . Failure To Thrive    [name] [name] is a 86 y.o. male with pertinent past medical history of dementia, seizure disorder, BPH, GI bleed, hypertension who presents the emergency department today for placement.  Per chart review patient was recently discharged 3 days ago for episode of unresponsiveness while sitting in the chair, failure to thrive at that time as well, COVID screening at that time turned out to be positive, however patient tested positive on April 18 at facility..  Chest x-ray did not show any pneumonia and patient was not hypoxic, echo did show moderate pericardial effusion at that time however cardiology did not recommend pericardiocentesis.  At that time we discussed that the syncopal episode was most likely secondary due to autonomic dysfunction.  At that time due todementia with multiple comorbidities, palliative care was consulted, however do not see an note that palliative care conducted, do not think that patient actually saw palliative care.  Spoke to patient's wife over the phone who states that she will come in as soon as she can, states that she is unable to handle him at home, states that she was supposed to be receiving help, however needs more help at home.  Patient oriented x3, however unsure why he is here, level 5 caveat due to dementia.  HPI     Past Medical History:  Diagnosis Date  . Allergy   . Dementia [facility])    sees Dr. Patrcia Dolly   . Diverticulosis of colon (without mention of hemorrhage) 03-01-2004, 04-04-2011   Colonoscopy  . ED (erectile dysfunction)   . Glaucoma    sees Dr. Arvil Chaco   . Hyperglycemia   . Hyperlipidemia   . Hypertension   . Internal hemorrhoids 03-15-1999   Flex Sig   . Irritable bowel     Patient Active Problem List   Diagnosis Date Noted  .  Pressure injury of skin 05/11/2021  . Pericardial effusion, acute   . COVID   . Palliative care by specialist   . Goals of care, counseling/discussion   . General weakness   . Syncope 05/07/2021  . Seizure (HCC) 03/09/2021  . Dementia (HCC) 02/25/2021  . BPH with urinary obstruction 12/22/2020  . Ankle edema, bilateral 07/13/2020  . Hallucinations 02/09/2020  . Abdominal pain, acute 08/21/2014  . GI bleeding 05/26/2012  . Renal mass 05/26/2012  . Diverticulosis of colon with hemorrhage 05/26/2012  . SHOULDER PAIN, BILATERAL 07/30/2010  . HERPES SIMPLEX INFECTION 10/10/2007  . TACHYCARDIA, PAROXYSMAL NOS 10/10/2007  . Dyslipidemia 07/11/2007  . Essential hypertension 07/11/2007  . ALLERGIC RHINITIS 07/11/2007    Past Surgical History:  Procedure Laterality Date  . COLONOSCOPY  04-04-11   per Dr. Jarold Motto, clear, no repeats needed  . EYE SURGERY     bilateral cataract extraction per Dr Jettie Pagan  . INGUINAL HERNIA REPAIR    . KNEE ARTHROSCOPY     right knee  . NASAL SINUS SURGERY         Family History  Problem Relation Age of Onset  . Hypertension Mother   . Lung cancer Father   . Colitis Daughter     Social History   Tobacco Use  . Smoking status: Former Smoker    Quit date: 03/20/1969    Years since quitting: 52.1  . Smokeless tobacco: Never Used  Vaping Use  . Vaping Use: Never used  Substance Use Topics  . Alcohol use: Yes    Alcohol/week: 0.0 standard drinks    Comment: occ  . Drug use: No    Home Medications Prior to Admission medications   Medication Sig Start Date End Date Taking? Authorizing Provider  AZOPT 1 % ophthalmic suspension Place 1 drop into both eyes 3 (three) times daily. 12/18/19   [provider]  bimatoprost (LUMIGAN) 0.01 % SOLN Place 1 drop into both eyes at bedtime.    [provider]  cetirizine (ZYRTEC) 10 MG tablet Take 10 mg by mouth daily as needed for allergies.    [provider]  Coenzyme Q10 (CO Q  10 PO) Take 1 capsule by mouth daily.    [provider]  divalproex (DEPAKOTE ER) 500 MG 24 hr tablet Take 2 tablets (1,000 mg total) by mouth daily. Take 1 tablet every night 05/12/21   Burnadette PopAdhikari, Amrit, MD  donepezil (ARICEPT) 10 MG tablet Take 20 mg by mouth at bedtime. 03/06/21   [provider]  fish oil-omega-3 fatty acids 1000 MG capsule Take 1 g by mouth daily.    [provider]  Glucosamine-Chondroitin 500-400 MG CAPS Take 1 tablet by mouth daily.    [provider]  levETIRAcetam (KEPPRA) 500 MG tablet TAKE 1 TABLET (500 MG TOTAL) BY MOUTH TWO TIMES DAILY. Patient taking differently: Take 500 mg by mouth 2 (two) times daily. 03/11/21 03/11/22  Rodolph Bonghompson, Daniel V, MD  melatonin 5 MG TABS Take 5 mg by mouth at bedtime.    [provider]  memantine (NAMENDA) 10 MG tablet Take 1 tablet twice a day Patient taking differently: Take 10 mg by mouth 2 (two) times daily. 03/22/21   Van ClinesAquino, Karen M, MD  midodrine (PROAMATINE) 5 MG tablet Take 1 tablet (5 mg total) by mouth 3 (three) times daily with meals. 05/12/21   Burnadette PopAdhikari, Amrit, MD  MILK THISTLE PO Take 1 capsule by mouth daily.    [provider]  Multiple Vitamins-Minerals (MULTIVITAMIN WITH MINERALS) tablet Take 1 tablet by mouth daily.    [provider]  polyethylene glycol (MIRALAX / GLYCOLAX) 17 g packet Take 17 g by mouth daily as needed for moderate constipation.    [provider]  RHOPRESSA 0.02 % SOLN Place 1 drop into both eyes at bedtime. 08/17/19   [provider]  risperiDONE (RISPERDAL) 0.5 MG tablet Take 1 tablet (0.5 mg total) by mouth at bedtime. 02/25/20   Nelwyn SalisburyFry, Stephen A, MD    Allergies    Chocolate, Ciprofloxacin, and Lipitor [atorvastatin]  Review of Systems   Review of Systems  Unable to perform ROS: Dementia    Physical Exam Updated Vital Signs BP 138/77   Pulse (!) 59   Temp 98.2 F (36.8 C) (Oral)   Resp 18   Ht 6\' 1"  (1.854 m)    Wt 72 kg   SpO2 98%   BMI 20.94 kg/m   Physical Exam Constitutional:      General: He is not in acute distress.    Appearance: Normal appearance. He is not ill-appearing, toxic-appearing or diaphoretic.  HENT:     Head: Normocephalic and atraumatic.     Mouth/Throat:     Mouth: Mucous membranes are moist.  Eyes:     Pupils: Pupils are equal, round, and reactive to light.  Cardiovascular:     Rate and Rhythm: Normal rate and regular rhythm.  Pulses: Normal pulses.  Pulmonary:     Effort: Pulmonary effort is normal.     Breath sounds: Normal breath sounds.  Abdominal:     General: Abdomen is flat. There is no distension.     Palpations: Abdomen is soft.  Musculoskeletal:        General: Normal range of motion.  Skin:    General: Skin is warm and dry.     Capillary Refill: Capillary refill takes less than 2 seconds.  Neurological:     General: No focal deficit present.     Mental Status: He is alert and oriented to person, place, and time. Mental status is at baseline.     Comments: Patient is alert and oriented to self, place, time, however unsure why he is here.  Able to follow commands, no focal neurodeficits noted, bilateral upper extremities weaker compared to lower extremities.  No facial droop, clear speech.  Psychiatric:        Mood and Affect: Mood normal.        Behavior: Behavior normal.        Thought Content: Thought content normal.     ED Results / Procedures / Treatments   Labs (all labs ordered are listed, but only abnormal results are displayed) Labs Reviewed  COMPREHENSIVE METABOLIC PANEL - Abnormal; Notable for the following components:      Result Value   Calcium 8.7 (*)    Total Protein 5.8 (*)    Albumin 2.9 (*)    All other components within normal limits  CBC WITH DIFFERENTIAL/PLATELET - Abnormal; Notable for the following components:   Lymphs Abs 0.6 (*)    All other components within normal limits  URINALYSIS, ROUTINE W REFLEX  MICROSCOPIC - Abnormal; Notable for the following components:   Ketones, ur 20 (*)    All other components within normal limits    EKG   Radiology No results found.  Procedures Procedures   Medications Ordered in ED Medications - No data to display  ED Course  I have reviewed the triage vital signs and the nursing notes.  Pertinent labs & imaging results that were available during my care of the patient were reviewed by me and considered in my medical decision making (see chart for details).    MDM Rules/Calculators/A&P                          86 year old male with dementia and multiple comorbidities here today for placement. I was able to speak to patient's wife in person, she states that she wants him placed.  Dr. Effie Shy spoke to wife, he recommends face-to-face evaluation to get this process started.  Wife is agreeable with this plan, transition of care consult placed.  Patient be discharged at this time, appears well, no need for admission at this time.  CBC CMP and urinalysis unremarkable.  EKG without any significant changes from last time.  Plan discussed with wife in detail, she is agreeable for discharge at this time.  Patient be discharged.  I discussed this case with my attending physician who cosigned this note including patient's presenting symptoms, physical exam, and planned diagnostics and interventions. Attending physician stated agreement with plan or made changes to plan which were implemented.   Attending physician assessed patient at bedside.  Final Clinical Impression(s) / ED Diagnoses Final diagnoses:  Failure to thrive in adult    Rx / DC Orders ED Discharge Orders  Ordered    Home Health        05/15/21 1433    Face-to-face encounter (required for Medicare/Medicaid patients)       Comments: I Farrel Gordon certify that this patient is under my care and that I, or a nurse practitioner or physician's assistant working with me, had a face-to-face  encounter that meets the physician face-to-face encounter requirements with this patient on 05/15/2021. The encounter with the patient was in whole, or in part for the following medical condition(s) which is the primary reason for home health care (List medical condition):dementia, wife unable to care for him.   05/15/21 1433           Farrel Gordon, PA-C 05/15/21 1441    Mancel Bale, MD 05/16/21 1758

## 2021-05-15 NOTE — ED Notes (Signed)
Per [facility], patient was removed from list for unknown reason. Patient placed back onto list.

## 2021-05-15 NOTE — Discharge Instructions (Signed)
There should be multiple people reaching out to you this week as we discussed in regards to placement and help at home.  If there are any new worsening concerning symptoms please come back to the emergency department.

## 2021-05-15 NOTE — TOC Initial Note (Addendum)
Transition of Care [facility]) - Initial/Assessment Note    Patient Details  Name: [name] [name] MRN: [id number] Date of Birth: 1935/07/05  Transition of Care [facility]) CM/SW Contact:    Lockie Pares, RN Phone Number: 05/15/2021, 4:28 PM  Clinical Narrative:                  Spoke to RN, MD spoke to wife at length. She is having a difficult time managing patient at home. She is elderly and is trying to perform complete care, repositioning ( he didn't turn over when she asked) had to leave him I nurine because she just could not get the sheets out from under him. He was just evaluated by PT OT and home health will be coming out tomorrow. Via [facility]. Wife is concerned because she has had several meetings (Palliative etc) that said they were coming, but didn't come or showed up late. She is amiable to rehab placement, however she stated the doctor told her that they do not do this from the ED. Even though he has been placed from the ED before.  I asked if she could call anyone to assist her nad if [facility] will be there to morrow, we can get their team input as well. It is past the time of a PT evaluation. The patient does not know why he is here in the ED.  Dr Effie Shy will decide to move forward with PT eval in the AM and keep patient or send patient home based on this information that was provided.  Discussed with Dr Effie Shy and RN he will be going hoe by [facility]- Wife claled and aware he will be coming home. She has contacted someone that can do private duty to start Monday. [facility] will be there tomorrow.        Patient Goals and CMS Choice        Expected Discharge Plan and Services    Patient has [facility] services PT OT Aide Stoical work                                            Prior Living Arrangements/Services                       Activities of Daily Living      Permission Sought/Granted                  Emotional Assessment               Admission diagnosis:  Failure to Thrive Patient Active Problem List   Diagnosis Date Noted  . Pressure injury of skin 05/11/2021  . Pericardial effusion, acute   . COVID   . Palliative care by specialist   . Goals of care, counseling/discussion   . General weakness   . Syncope 05/07/2021  . Seizure (HCC) 03/09/2021  . Dementia (HCC) 02/25/2021  . BPH with urinary obstruction 12/22/2020  . Ankle edema, bilateral 07/13/2020  . Hallucinations 02/09/2020  . Abdominal pain, acute 08/21/2014  . GI bleeding 05/26/2012  . Renal mass 05/26/2012  . Diverticulosis of colon with hemorrhage 05/26/2012  . SHOULDER PAIN, BILATERAL 07/30/2010  . HERPES SIMPLEX INFECTION 10/10/2007  . TACHYCARDIA, PAROXYSMAL NOS 10/10/2007  . Dyslipidemia 07/11/2007  . Essential hypertension 07/11/2007  . ALLERGIC RHINITIS 07/11/2007  PCP:  [name], MD Pharmacy:   [facility] [address], [place] - [address] [place] [address] Phone: [phone] Fax: [phone]     Social Determinants of Health (SDOH) Interventions    Readmission Risk Interventions No flowsheet data found.

## 2021-05-15 NOTE — ED Notes (Signed)
Turned and positioned, awaiting [facility] transport home

## 2021-05-15 NOTE — ED Provider Notes (Signed)
  Face-to-face evaluation   History: Patient chronically debilitated, presenting with difficult management issues at home.  Recently discharged in the hospital after 5-day stay, 3 days ago.  At that time he had been evaluated for unresponsiveness, had a pericardial effusion, and noted to have a COVID infection about a month ago.  Outpatient management planned at discharge included home health services.  Palliative care saw the patient during the hospitalization.  Physical exam: Elderly man who is alert and cooperative.  He follows commands well.  Good grip strength bilaterally.  Able to move arms and legs independently.  No pronator drift.  No abdominal tenderness.  I had a long discussion with the patient's wife regarding her concerns and the patient's needs.  Patient's wife is concerned that she has trouble managing his care, specifically cleaning him because he has trouble cooperating.  She requests a condom catheter to help control urine.  She would like the patient to be admitted to rehab to get him stronger.  She states she has been in communication with home health who was engaged following the last hospital discharge several days ago.  They plan on seeing the patient on Monday.  I discussed with the wife, that the patient has no admittable diagnosis at this time and the next step for his care will be home health services.  We can also request a palliative care consultation, to make sure his appropriate needs are met.  He may require placement in a skilled facility due to decompensation.  That can be done from his home using home health services.  Medical screening examination/treatment/procedure(s) were conducted as a shared visit with non-physician practitioner(s) and myself.  I personally evaluated the patient during the encounter    Daleen Bo, MD 05/16/21 1757

## 2021-05-17 ENCOUNTER — Telehealth: Payer: Self-pay | Admitting: Family Medicine

## 2021-05-17 NOTE — Telephone Encounter (Signed)
They tried to do his resumption of care over the weekend and the patient needs to be placed in a skilled nursing facility. The wife is unable to take care of him. She has tried to have him sent to the hospital but they can't find anything wrong with him so they send him home.  [facility]

## 2021-05-17 NOTE — Telephone Encounter (Signed)
Please ask the home health social worker to assist his wife in finding a skilled nursing center for him

## 2021-05-18 ENCOUNTER — Emergency Department (HOSPITAL_COMMUNITY): Payer: Medicare Other

## 2021-05-18 ENCOUNTER — Emergency Department (HOSPITAL_COMMUNITY)
Admission: EM | Admit: 2021-05-18 | Discharge: 2021-05-21 | Disposition: A | Discharge status: Home / Self Care | Payer: Medicare Other | Attending: Emergency Medicine | Admitting: Emergency Medicine

## 2021-05-18 DIAGNOSIS — Z87891 Personal history of nicotine dependence: Secondary | ICD-10-CM | POA: Insufficient documentation

## 2021-05-18 DIAGNOSIS — F0390 Unspecified dementia without behavioral disturbance: Secondary | ICD-10-CM | POA: Insufficient documentation

## 2021-05-18 DIAGNOSIS — M79604 Pain in right leg: Secondary | ICD-10-CM

## 2021-05-18 DIAGNOSIS — Z79899 Other long term (current) drug therapy: Secondary | ICD-10-CM | POA: Diagnosis not present

## 2021-05-18 DIAGNOSIS — M79661 Pain in right lower leg: Secondary | ICD-10-CM | POA: Insufficient documentation

## 2021-05-18 DIAGNOSIS — M79662 Pain in left lower leg: Secondary | ICD-10-CM | POA: Insufficient documentation

## 2021-05-18 DIAGNOSIS — Z20822 Contact with and (suspected) exposure to covid-19: Secondary | ICD-10-CM | POA: Diagnosis not present

## 2021-05-18 DIAGNOSIS — I10 Essential (primary) hypertension: Secondary | ICD-10-CM | POA: Diagnosis not present

## 2021-05-18 DIAGNOSIS — Z8616 Personal history of COVID-19: Secondary | ICD-10-CM | POA: Insufficient documentation

## 2021-05-18 DIAGNOSIS — R262 Difficulty in walking, not elsewhere classified: Secondary | ICD-10-CM

## 2021-05-18 LAB — CBC WITH DIFFERENTIAL/PLATELET
Abs Immature Granulocytes: 0.03 10*3/uL (ref 0.00–0.07)
Basophils Absolute: 0 10*3/uL (ref 0.0–0.1)
Basophils Relative: 1 %
Eosinophils Absolute: 0.3 10*3/uL (ref 0.0–0.5)
Eosinophils Relative: 6 %
HCT: 43.4 % (ref 39.0–52.0)
Hemoglobin: 13.7 g/dL (ref 13.0–17.0)
Immature Granulocytes: 1 %
Lymphocytes Relative: 11 %
Lymphs Abs: 0.5 10*3/uL — ABNORMAL LOW (ref 0.7–4.0)
MCH: 26.5 pg (ref 26.0–34.0)
MCHC: 31.6 g/dL (ref 30.0–36.0)
MCV: 83.9 fL (ref 80.0–100.0)
Monocytes Absolute: 1 10*3/uL (ref 0.1–1.0)
Monocytes Relative: 21 %
Neutro Abs: 2.9 10*3/uL (ref 1.7–7.7)
Neutrophils Relative %: 60 %
Platelets: 289 10*3/uL (ref 150–400)
RBC: 5.17 MIL/uL (ref 4.22–5.81)
RDW: 13.4 % (ref 11.5–15.5)
WBC: 4.8 10*3/uL (ref 4.0–10.5)
nRBC: 0 % (ref 0.0–0.2)

## 2021-05-18 LAB — COMPREHENSIVE METABOLIC PANEL
ALT: 22 U/L (ref 0–44)
AST: 22 U/L (ref 15–41)
Albumin: 2.8 g/dL — ABNORMAL LOW (ref 3.5–5.0)
Alkaline Phosphatase: 58 U/L (ref 38–126)
Anion gap: 8 (ref 5–15)
BUN: 10 mg/dL (ref 8–23)
CO2: 25 mmol/L (ref 22–32)
Calcium: 9 mg/dL (ref 8.9–10.3)
Chloride: 103 mmol/L (ref 98–111)
Creatinine, Ser: 0.75 mg/dL (ref 0.61–1.24)
GFR, Estimated: >60 mL/min (ref >60)
Glucose, Bld: 109 mg/dL — ABNORMAL HIGH (ref 70–99)
Potassium: 3.8 mmol/L (ref 3.5–5.1)
Sodium: 136 mmol/L (ref 135–145)
Total Bilirubin: 0.7 mg/dL (ref 0.3–1.2)
Total Protein: 6 g/dL — ABNORMAL LOW (ref 6.5–8.1)

## 2021-05-18 LAB — URINALYSIS, ROUTINE W REFLEX MICROSCOPIC
Bilirubin Urine: NEGATIVE
Glucose, UA: NEGATIVE mg/dL
Ketones, ur: 20 mg/dL — AB
Nitrite: NEGATIVE
Protein, ur: NEGATIVE mg/dL
Specific Gravity, Urine: 1.009 (ref 1.005–1.030)
pH: 6 (ref 5.0–8.0)

## 2021-05-18 NOTE — ED Provider Notes (Signed)
[facility] Note   CSN: [id number] Arrival date & time: 05/18/21  1421     History Chief Complaint  Patient presents with  . Leg Pain    [name] BenceJoe L [name] is a 86 y.o. male.  86 y.o male with a PMH of Dementia, HTN, Covid infection (05/07/2021) presents to the ED via EMS with a chief complaint of bilateral leg pain.  He reports having recurrent falls, has had worsening bilateral leg pain.  According to EMS note, patient had therapy earlier this week, reports having pain post therapy.  According to him he has not had any additional falls.  I have extensively reviewed the records, patient was discharged back home after negative work-up.  Patient does have a history of dementia at baseline, will contact wife [name] PhilipsGeraldine in order to obtain collateral information.  He denies any chest pain, shortness of breath, headache.  The history is provided by the patient and medical records.  Leg Pain Location:  Leg Leg location:  R leg Pain details:    Severity:  Mild Associated symptoms: no fever        Past Medical History:  Diagnosis Date  . Allergy   . Dementia [facility](HCC)    sees Dr. Patrcia DollyKaren Aquino   . Diverticulosis of colon (without mention of hemorrhage) 03-01-2004, 04-04-2011   Colonoscopy  . ED (erectile dysfunction)   . Glaucoma    sees Dr. Arvil Chacooy Whittaker   . Hyperglycemia   . Hyperlipidemia   . Hypertension   . Internal hemorrhoids 03-15-1999   Flex Sig   . Irritable bowel     Patient Active Problem List   Diagnosis Date Noted  . Pressure injury of skin 05/11/2021  . Pericardial effusion, acute   . COVID   . Palliative care by specialist   . Goals of care, counseling/discussion   . General weakness   . Syncope 05/07/2021  . Seizure (HCC) 03/09/2021  . Dementia (HCC) 02/25/2021  . BPH with urinary obstruction 12/22/2020  . Ankle edema, bilateral 07/13/2020  . Hallucinations 02/09/2020  . Abdominal pain, acute 08/21/2014  . GI  bleeding 05/26/2012  . Renal mass 05/26/2012  . Diverticulosis of colon with hemorrhage 05/26/2012  . SHOULDER PAIN, BILATERAL 07/30/2010  . HERPES SIMPLEX INFECTION 10/10/2007  . TACHYCARDIA, PAROXYSMAL NOS 10/10/2007  . Dyslipidemia 07/11/2007  . Essential hypertension 07/11/2007  . ALLERGIC RHINITIS 07/11/2007    Past Surgical History:  Procedure Laterality Date  . COLONOSCOPY  04-04-11   per Dr. Jarold MottoPatterson, clear, no repeats needed  . EYE SURGERY     bilateral cataract extraction per Dr Jettie PaganEpps  . INGUINAL HERNIA REPAIR    . KNEE ARTHROSCOPY     right knee  . NASAL SINUS SURGERY         Family History  Problem Relation Age of Onset  . Hypertension Mother   . Lung cancer Father   . Colitis Daughter     Social History   Tobacco Use  . Smoking status: Former Smoker    Quit date: 03/20/1969    Years since quitting: 52.1  . Smokeless tobacco: Never Used  Vaping Use  . Vaping Use: Never used  Substance Use Topics  . Alcohol use: Yes    Alcohol/week: 0.0 standard drinks    Comment: occ  . Drug use: No    Home Medications Prior to Admission medications   Medication Sig Start Date End Date Taking? Authorizing Provider  AZOPT 1 % ophthalmic suspension  Place 1 drop into both eyes 3 (three) times daily. 12/18/19   [provider]  bimatoprost (LUMIGAN) 0.01 % SOLN Place 1 drop into both eyes at bedtime.    [provider]  cetirizine (ZYRTEC) 10 MG tablet Take 10 mg by mouth daily as needed for allergies.    [provider]  Coenzyme Q10 (CO Q 10 PO) Take 1 capsule by mouth daily.    [provider]  divalproex (DEPAKOTE ER) 500 MG 24 hr tablet Take 2 tablets (1,000 mg total) by mouth daily. Take 1 tablet every night 05/12/21   [name], MD  donepezil (ARICEPT) 10 MG tablet Take 20 mg by mouth at bedtime. 03/06/21   [provider]  fish oil-omega-3 fatty acids 1000 MG capsule Take 1 g by mouth daily.    [provider]  Glucosamine-Chondroitin 500-400 MG CAPS Take 1 tablet by mouth daily.    [provider]  levETIRAcetam (KEPPRA) 500 MG tablet TAKE 1 TABLET (500 MG TOTAL) BY MOUTH TWO TIMES DAILY. Patient taking differently: Take 500 mg by mouth 2 (two) times daily. 03/11/21 03/11/22  [name], MD  melatonin 5 MG TABS Take 5 mg by mouth at bedtime.    [provider]  memantine (NAMENDA) 10 MG tablet Take 1 tablet twice a day Patient taking differently: Take 10 mg by mouth 2 (two) times daily. 03/22/21   Van Clines, MD  midodrine (PROAMATINE) 5 MG tablet Take 1 tablet (5 mg total) by mouth 3 (three) times daily with meals. 05/12/21   [name], MD  MILK THISTLE PO Take 1 capsule by mouth daily.    [provider]  Multiple Vitamins-Minerals (MULTIVITAMIN WITH MINERALS) tablet Take 1 tablet by mouth daily.    [provider]  polyethylene glycol (MIRALAX / GLYCOLAX) 17 g packet Take 17 g by mouth daily as needed for moderate constipation.    [provider]  RHOPRESSA 0.02 % SOLN Place 1 drop into both eyes at bedtime. 08/17/19   [provider]  risperiDONE (RISPERDAL) 0.5 MG tablet Take 1 tablet (0.5 mg total) by mouth at bedtime. 02/25/20   [name], MD    Allergies    Chocolate, Ciprofloxacin, and Lipitor [atorvastatin]  Review of Systems   Review of Systems  Unable to perform ROS: Mental status change  Constitutional: Negative for fever.  Respiratory: Negative for shortness of breath.   Cardiovascular: Negative for chest pain.  All other systems reviewed and are negative.   Physical Exam Updated Vital Signs BP 122/66   Pulse 92   Temp 98.3 F (36.8 C) (Oral)   Resp 14   SpO2 94%   Physical Exam Vitals and nursing note reviewed.  Constitutional:      Appearance: Normal appearance.  HENT:     Head: Normocephalic and atraumatic.     Nose: Nose normal.     Mouth/Throat:     Mouth: Mucous membranes are moist.   Eyes:     Pupils: Pupils are equal, round, and reactive to light.  Cardiovascular:     Rate and Rhythm: Normal rate.  Pulmonary:     Effort: Pulmonary effort is normal.     Breath sounds: No wheezing or rales.  Abdominal:     General: Abdomen is flat.     Tenderness: There is no abdominal tenderness. There is no right CVA tenderness or left CVA tenderness.  Musculoskeletal:        General:  Swelling and tenderness present.     Cervical back: Normal range of motion and neck supple.  Neurological:     Mental Status: He is alert. Mental status is at baseline.     ED Results / Procedures / Treatments   Labs (all labs ordered are listed, but only abnormal results are displayed) Labs Reviewed  CBC WITH DIFFERENTIAL/PLATELET - Abnormal; Notable for the following components:      Result Value   Lymphs Abs 0.5 (*)    All other components within normal limits  COMPREHENSIVE METABOLIC PANEL - Abnormal; Notable for the following components:   Glucose, Bld 109 (*)    Total Protein 6.0 (*)    Albumin 2.8 (*)    All other components within normal limits  URINALYSIS, ROUTINE W REFLEX MICROSCOPIC - Abnormal; Notable for the following components:   Hgb urine dipstick SMALL (*)    Ketones, ur 20 (*)    Leukocytes,Ua SMALL (*)    Bacteria, UA FEW (*)    All other components within normal limits  URINE CULTURE    EKG None  Radiology DG Knee 2 Views Right  Result Date: 05/18/2021 CLINICAL DATA:  Recent fall with anterior knee pain, initial encounter EXAM: RIGHT KNEE - 2 VIEW COMPARISON:  None. FINDINGS: Tricompartmental degenerative changes are noted. Large joint effusion is seen. No acute fracture or dislocation is noted. Vascular calcifications are seen. IMPRESSION: Degenerative change with large joint effusion. No acute bony abnormality is noted. Electronically Signed   By: Alcide Clever M.D.   On: 05/18/2021 16:03   DG Chest Portable 1 View  Result Date: 05/18/2021 CLINICAL DATA:   Cough for several weeks EXAM: PORTABLE CHEST 1 VIEW COMPARISON:  05/07/2021 FINDINGS: Cardiac shadow is stable. Aortic calcifications are again seen. The lungs are well aerated bilaterally. No focal infiltrate or effusion is noted. No acute bony abnormality is seen. IMPRESSION: No acute abnormality noted. Electronically Signed   By: Alcide Clever M.D.   On: 05/18/2021 16:02    Procedures Procedures   Medications Ordered in ED Medications - No data to display  ED Course  I have reviewed the triage vital signs and the nursing notes.  Pertinent labs & imaging results that were available during my care of the patient were reviewed by me and considered in my medical decision making (see chart for details).  Clinical Course as of 05/18/21 2313  Tue May 18, 2021  2031 Glori Luis): SMALL [JS]  2031 Bacteria, UA(!): FEW [JS]    Clinical Course User Index [JS] Claude Manges, PA-C   MDM Rules/Calculators/A&P                         Patient arrived to the ED from home via EMS for worsening bilateral leg pain.  According to his records, he had therapy performed after his fall and had worsening pain to bilateral legs.  He arrived in the ED with stable vital signs, afebrile, satting at 100% on room air.  According to chart review, patient has had multiple visits to the ED, he is unable to be taken care of by his wife [name] [name], has had blood in his urine lately.  In addition she is unable to care for him at home.  Questionable SNF placement.  His lungs are clear to auscultation, he did have a COVID-19 infection on May 13 and was discharged from the SNF facility back home.  CBC without any leukocytosis.  CMP without any electrolyte  derangement, creatinine levels within normal limits.  LFTs unremarkable.  UA with small leukocytes, and few bacteria this was sent for culture as well.  He is denying urinary symptoms.  According to wife he has had increase in urination.  We will consult social work in order  to further approach SNF placement.  X-ray of his right knee showed: Degenerative change with large joint effusion. No acute bony  abnormality is noted.   X-ray of the chest without any acute finding.  05:30 PM Spoke to [facility], who recommended PT/OT eval prior to likely SNIFF placement if patient meets criteria. She has spoke to wife [name] [name], looks like patient received an early discharge from SNF rehabilitation.  Patient will need to have PT and OT done prior to receiving new placement.  I spoke to [place] [facility] psychotherapist who reports PT is currently gone for the day but they will evaluate him first thing in the morning.  Patient is to be a border in the emergency department.  According to his chart he does have home health who comes and sees him but will need to be placed in a skilled nursing facility.   11:13 PM patient will need evaluation by OT/PT in order to have SNF placement.  Portions of this note were generated with Scientist, clinical (histocompatibility and immunogenetics). Dictation errors may occur despite best attempts at proofreading.  Final Clinical Impression(s) / ED Diagnoses Final diagnoses:  Pain of right lower extremity    Rx / DC Orders ED Discharge Orders    None       Claude Manges, PA-C 05/18/21 2313    Tegeler, Canary Brim, MD 05/19/21 [phone]

## 2021-05-18 NOTE — ED Triage Notes (Signed)
BIB [facility] after wife called to report increased BIL leg pain. Per EMS, pt went to [facility] for leg strengthening a few days ago and since then has had increase BIL leg pain. Denies falls. EMS also reports that wife has seen blood in urine. HX dementia, HTN, seizures.

## 2021-05-18 NOTE — Care Management (Signed)
ED RNCM received consult for assistance with transitional care  Planning. Reviewed record and contacted patient's spouse Ronnell [place] [phone].  She reports that patient was in [facility] last month for reconditioning, but after 2 days contracted Covid and was sent home after 5 days without any Therapy.  She complains that he is even weaker and unable to ambulate.  He has has several falls and the last fall  was today and [facility] was called and he was transferred to [facility] ED.  The wife states she would like him to go to a rehab where he can get some rehab because she is unable to care for him at home.  Patient may have more rehab days but I was unable to verify at this late time. PT evaluation has been ordered and a page went out for the off shift PT.  Patient is awaiting PT for continued Therapy at a SNF.  CSW will follow up in the am. Updated EDP and Nursing.

## 2021-05-19 LAB — RESP PANEL BY RT-PCR (FLU A&B, COVID) ARPGX2
Influenza A by PCR: NEGATIVE
Influenza B by PCR: NEGATIVE
SARS Coronavirus 2 by RT PCR: NEGATIVE

## 2021-05-19 MED ORDER — DONEPEZIL HCL 10 MG PO TABS
20.0000 mg | ORAL_TABLET | Freq: Every day | ORAL | Status: DC
  Administered 2021-05-20 (×2): 20 mg via ORAL
  Filled 2021-05-19 (×2): qty 2
  Filled 2021-05-19: qty 4

## 2021-05-19 MED ORDER — DIVALPROEX SODIUM ER 500 MG PO TB24
1000.0000 mg | ORAL_TABLET | Freq: Every day | ORAL | Status: DC
  Administered 2021-05-20 (×2): 1000 mg via ORAL
  Filled 2021-05-19 (×3): qty 2

## 2021-05-19 MED ORDER — MELATONIN 5 MG PO TABS
5.0000 mg | ORAL_TABLET | Freq: Every day | ORAL | Status: DC
  Administered 2021-05-20: 5 mg via ORAL
  Filled 2021-05-19: qty 1

## 2021-05-19 MED ORDER — RISPERIDONE 0.5 MG PO TABS
0.5000 mg | ORAL_TABLET | Freq: Every day | ORAL | Status: DC
  Administered 2021-05-20 (×2): 0.5 mg via ORAL
  Filled 2021-05-19 (×3): qty 1

## 2021-05-19 MED ORDER — MIDODRINE HCL 5 MG PO TABS
5.0000 mg | ORAL_TABLET | Freq: Three times a day (TID) | ORAL | Status: DC
  Administered 2021-05-20 – 2021-05-21 (×5): 5 mg via ORAL
  Filled 2021-05-19 (×5): qty 1

## 2021-05-19 MED ORDER — POLYETHYLENE GLYCOL 3350 17 G PO PACK: 17.0000 g | PACK | Freq: Every day | ORAL | Status: DC | PRN

## 2021-05-19 MED ORDER — MEMANTINE HCL 10 MG PO TABS
10.0000 mg | ORAL_TABLET | Freq: Two times a day (BID) | ORAL | Status: DC
  Administered 2021-05-20 – 2021-05-21 (×4): 10 mg via ORAL
  Filled 2021-05-19 (×5): qty 1

## 2021-05-19 NOTE — Progress Notes (Signed)
Insurance authorization was submitted with [employer]. Pending approval

## 2021-05-19 NOTE — ED Provider Notes (Signed)
Emergency Medicine Observation Re-evaluation Note  JSAON YOO is a 86 y.o. male, seen on rounds today.  Pt initially presented to the ED for complaints of Leg Pain Currently, the patient is resting.  Physical Exam  BP (!) 155/99   Pulse 91   Temp 98 F (36.7 C) (Oral)   Resp 18   SpO2 100%  Physical Exam General: resting in bed Cardiac: warm and well perfused Lungs: even and unlabored Psych: calm  ED Course / MDM  EKG:   I have reviewed the labs performed to date as well as medications administered while in observation.  Recent changes in the last 24 hours include physical therapy evaluated and recommended SNF placement, transitions of care working on placement.  I noted home meds had not been ordered and placed them.  Plan  Current plan is for placement per CM/SW. Placed orders for home meds   Milagros Loll, MD 05/19/21 2320

## 2021-05-19 NOTE — Telephone Encounter (Signed)
Patient daughter is calling and wanted to see if provider can send an order for a hospital bed with full rails to be sent to [facility]. Daughter stated that is is required for when patient goes to the facility. CB is [phone] Number to adapt is [phone]

## 2021-05-19 NOTE — Progress Notes (Signed)
[employer] Reference number [id number]

## 2021-05-19 NOTE — TOC Initial Note (Signed)
Transition of Care [facility]) - Initial/Assessment Note    Patient Details  Name: [name] [name] MRN: [id number] Date of Birth: 1935/07/25  [facility] CM/SW Contact:    Susa Simmonds, LCSWA Phone Number: 05/19/2021, 12:39 PM  Clinical Narrative:  CSW spoke with patients wife who agrees with her husband going to a skilled nursing facility. Patients wife stated if she does not like the SNF offers she receives then she will be taking her husband home and look for placement options there. CSW spoke with patient who is aware of himself and the month. Patient was unsure why he is in the hospital.            Expected Discharge Plan: Skilled Nursing Facility Barriers to Discharge: Continued Medical Work up   Patient Goals and CMS Choice     Choice offered to / list presented to : Spouse  Expected Discharge Plan and Services Expected Discharge Plan: Skilled Nursing Facility       Living arrangements for the past 2 months: Single Family Home,Skilled Nursing Facility                                      Prior Living Arrangements/Services Living arrangements for the past 2 months: Single Family Home,Skilled Nursing Facility Lives with:: Spouse Patient language and need for interpreter reviewed:: Yes Do you feel safe going back to the place where you live?: Yes      Need for Family Participation in Patient Care: Yes (Comment) Care giver support system in place?: Yes (comment)   Criminal Activity/Legal Involvement Pertinent to Current Situation/Hospitalization: No - Comment as needed  Activities of Daily Living      Permission Sought/Granted   Permission granted to share information with : Yes, Release of Information Signed  Share Information with NAME: Randon Somera [phone]     Permission granted to share info w Relationship: Spouse  Permission granted to share info w Contact Information: [address] [place] [phone]  Emotional  Assessment Appearance:: Appears stated age Attitude/Demeanor/Rapport: Engaged Affect (typically observed): Accepting Orientation: : Oriented to Self,Oriented to  Time (Patient can idenitify the Month but not the specific day) Alcohol / Substance Use: Not Applicable Psych Involvement: No (comment)  Admission diagnosis:  leg pain  Patient Active Problem List   Diagnosis Date Noted  . Pressure injury of skin 05/11/2021  . Pericardial effusion, acute   . COVID   . Palliative care by specialist   . Goals of care, counseling/discussion   . General weakness   . Syncope 05/07/2021  . Seizure (HCC) 03/09/2021  . Dementia (HCC) 02/25/2021  . BPH with urinary obstruction 12/22/2020  . Ankle edema, bilateral 07/13/2020  . Hallucinations 02/09/2020  . Abdominal pain, acute 08/21/2014  . GI bleeding 05/26/2012  . Renal mass 05/26/2012  . Diverticulosis of colon with hemorrhage 05/26/2012  . SHOULDER PAIN, BILATERAL 07/30/2010  . HERPES SIMPLEX INFECTION 10/10/2007  . TACHYCARDIA, PAROXYSMAL NOS 10/10/2007  . Dyslipidemia 07/11/2007  . Essential hypertension 07/11/2007  . ALLERGIC RHINITIS 07/11/2007   PCP:  [name], MD Pharmacy:   [facility] [address] [place] - [address] [place] [address] Phone: [phone] Fax: [phone]     Social Determinants of Health (SDOH) Interventions    Readmission Risk Interventions No flowsheet data found.

## 2021-05-19 NOTE — Evaluation (Signed)
Physical Therapy Evaluation Patient Details Name: [name] [name] MRN: [id number] DOB: [birth year] Today's Date: 05/19/2021   History of Present Illness  Pt is an 86 y/o male presenting to the ED on 5/24 secondary to BLE pain and inability to ambulate. PMH includes HTN, Dementia, and COVID.  Clinical Impression  Pt admitted secondary to problem above with deficits below. Pt requiring max A +2 to roll for clean up as pt had soiled his brief. Pt getting slightly agitated and resistive so further mobility deferred. Calm by end of session. Of note, pt was also talking to someone who was not present in the room; notified nursing staff. Per notes, pt's wife having difficulty caring for pt at home and pt has had difficulty with mobility tasks. Recommending SNF level therapies at d/c to increase independence and safety with mobility. Will continue to follow acutely.     Follow Up Recommendations SNF;Supervision/Assistance - 24 hour    Equipment Recommendations  Wheelchair (measurements PT);Wheelchair cushion (measurements PT);Hospital bed    Recommendations for Other Services       Precautions / Restrictions Precautions Precautions: Fall Restrictions Weight Bearing Restrictions: No      Mobility  Bed Mobility Overal bed mobility: Needs Assistance Bed Mobility: Rolling Rolling: Max assist;+2 for physical assistance         General bed mobility comments: Max A +2 for rolling to change soiled brief. Pt getting slightly agitated when attempting to perform, so further mobility deferred.    Transfers                    Ambulation/Gait                Stairs            Wheelchair Mobility    Modified Rankin (Stroke Patients Only)       Balance                                             Pertinent Vitals/Pain Pain Assessment: Faces Faces Pain Scale: Hurts little more Pain Location: generalized Pain Descriptors / Indicators:  Grimacing;Guarding Pain Intervention(s): Monitored during session;Limited activity within patient's tolerance;Repositioned    Home Living Family/patient expects to be discharged to:: Skilled nursing facility                      Prior Function Level of Independence: Needs assistance   Gait / Transfers Assistance Needed: Per notes, pt recently d/c'd from SNF secondary to COVID and has had increased difficulty ambulating. Was using RW with assist prior to that.  ADL's / Homemaking Assistance Needed: Anticipate pt required assist        Hand Dominance        Extremity/Trunk Assessment   Upper Extremity Assessment Upper Extremity Assessment: Generalized weakness    Lower Extremity Assessment Lower Extremity Assessment: Generalized weakness       Communication   Communication: No difficulties  Cognition Arousal/Alertness: Awake/alert Behavior During Therapy: Restless;Agitated Overall Cognitive Status: No family/caregiver present to determine baseline cognitive functioning                                 General Comments: Dementia at baseline. Pt talking to someone that was not present in the room during session. Getting agitated when attempting  to change brief.      General Comments      Exercises     Assessment/Plan    PT Assessment Patient needs continued PT services  PT Problem List Decreased strength;Decreased balance;Decreased knowledge of use of DME;Decreased mobility;Decreased activity tolerance;Decreased safety awareness;Decreased cognition;Decreased knowledge of precautions       PT Treatment Interventions DME instruction;Therapeutic exercise;Gait training;Functional mobility training;Therapeutic activities;Patient/family education;Balance training;Cognitive remediation    PT Goals (Current goals can be found in the Care Plan section)  Acute Rehab PT Goals PT Goal Formulation: Patient unable to participate in goal setting Time For  Goal Achievement: 06/02/21 Potential to Achieve Goals: Fair    Frequency Min 2X/week   Barriers to discharge        Co-evaluation               AM-PAC PT "6 Clicks" Mobility  Outcome Measure Help needed turning from your back to your side while in a flat bed without using bedrails?: Total Help needed moving from lying on your back to sitting on the side of a flat bed without using bedrails?: Total Help needed moving to and from a bed to a chair (including a wheelchair)?: Total Help needed standing up from a chair using your arms (e.g., wheelchair or bedside chair)?: Total Help needed to walk in hospital room?: Total Help needed climbing 3-5 steps with a railing? : Total 6 Click Score: 6    End of Session   Activity Tolerance: Treatment limited secondary to agitation Patient left: in bed;with call bell/phone within reach;with nursing/sitter in room (on stretcher in ED) Nurse Communication: Mobility status PT Visit Diagnosis: Difficulty in walking, not elsewhere classified (R26.2);Adult, failure to thrive (R62.7);Muscle weakness (generalized) (M62.81)    Time: 1517-6160 PT Time Calculation (min) (ACUTE ONLY): 11 min   Charges:   PT Evaluation $PT Eval Moderate Complexity: 1 Mod          Farley Ly, PT, DPT  Acute Rehabilitation Services  Pager: [phone] Office: [phone]   Lehman Prom 05/19/2021, 12:07 PM

## 2021-05-19 NOTE — Care Management (Signed)
ED CM contacted patient's wife to discuss SNF selection. She states that he will be going to the [facility] under private pay. CM explained that patient will need to be discharge to a facility, patient cannot stay in the ED awaiting an available bed. Patient's wife states, her daughter made the arrangements. CM attempted contact daughter Velna Hatchet [phone] no answer, CM will make a 2nd attempt.

## 2021-05-19 NOTE — ED Notes (Signed)
Pt continuing to try and get out of bed, pt pulled off primofit, pt redirected and reminded he was at hospital. PT has moved from room 37 to room 40 so that staff could better watch pt. Pt now in rm 41 where he can be watched.

## 2021-05-19 NOTE — Progress Notes (Addendum)
CSW spoke with patients wife who stated she can no longer care for her husband. Ms. Sinclair stated she has an interview with the [facility] and [facility]. Patient is aware these facilities are private pay and she stated she could not pay anything over 4,000 a month. Ms. Ciano stated she will let CSW if her husband is able to go to these facilities. CSW is waiting for PT recommendation.

## 2021-05-19 NOTE — [place] FL2 (Signed)
[place] MEDICAID FL2 LEVEL OF CARE SCREENING TOOL     IDENTIFICATION  Patient Name: [name] [name] Birthdate: 10/11/1935 Sex: male Admission Date (Current Location): 05/18/2021  [facility] and [place] Number:  Producer, television/film/video and Address:  [facility], [address], [place], [place] [address]      Provider Number: [id number]  Attending Physician Name and Address:  Default, Provider, MD  Relative Name and Phone Number:  Gair,[name] Spouse  [phone]    Current Level of Care: Hospital Recommended Level of Care: Skilled Nursing Facility Prior Approval Number:    Date Approved/Denied:   PASRR Number: [id number]  Discharge Plan: SNF    Current Diagnoses: Patient Active Problem List   Diagnosis Date Noted  . Pressure injury of skin 05/11/2021  . Pericardial effusion, acute   . COVID   . Palliative care by specialist   . Goals of care, counseling/discussion   . General weakness   . Syncope 05/07/2021  . Seizure (HCC) 03/09/2021  . Dementia (HCC) 02/25/2021  . BPH with urinary obstruction 12/22/2020  . Ankle edema, bilateral 07/13/2020  . Hallucinations 02/09/2020  . Abdominal pain, acute 08/21/2014  . GI bleeding 05/26/2012  . Renal mass 05/26/2012  . Diverticulosis of colon with hemorrhage 05/26/2012  . SHOULDER PAIN, BILATERAL 07/30/2010  . HERPES SIMPLEX INFECTION 10/10/2007  . TACHYCARDIA, PAROXYSMAL NOS 10/10/2007  . Dyslipidemia 07/11/2007  . Essential hypertension 07/11/2007  . ALLERGIC RHINITIS 07/11/2007    Orientation RESPIRATION BLADDER Height & Weight     Self,Time  Normal Incontinent Weight:   Height:     BEHAVIORAL SYMPTOMS/MOOD NEUROLOGICAL BOWEL NUTRITION STATUS      Incontinent    AMBULATORY STATUS COMMUNICATION OF NEEDS Skin   Extensive Assist Verbally Normal                       Personal Care Assistance Level of Assistance  Total care Bathing Assistance: Maximum assistance Feeding  assistance: Maximum assistance Dressing Assistance: Maximum assistance Total Care Assistance: Maximum assistance   Functional Limitations Info  Speech,Hearing,Sight Sight Info: Adequate Hearing Info: Adequate Speech Info: Adequate    SPECIAL CARE FACTORS FREQUENCY                       Contractures Contractures Info: Not present    Additional Factors Info  Code Status,Allergies Code Status Info: Full Allergies Info: Chocolate, Ciprofloxacin, Lipitor           Current Medications (05/19/2021):  This is the current hospital active medication list No current facility-administered medications for this encounter.   Current Outpatient Medications  Medication Sig Dispense Refill  . AZOPT 1 % ophthalmic suspension Place 1 drop into both eyes 3 (three) times daily.    . bimatoprost (LUMIGAN) 0.01 % SOLN Place 1 drop into both eyes at bedtime.    . cetirizine (ZYRTEC) 10 MG tablet Take 10 mg by mouth daily as needed for allergies.    . Coenzyme Q10 (CO Q 10 PO) Take 1 capsule by mouth daily.    . divalproex (DEPAKOTE ER) 500 MG 24 hr tablet Take 2 tablets (1,000 mg total) by mouth daily. Take 1 tablet every night 30 tablet 6  . donepezil (ARICEPT) 10 MG tablet Take 20 mg by mouth at bedtime.    . fish oil-omega-3 fatty acids 1000 MG capsule Take 1 g by mouth daily.    . Glucosamine-Chondroitin 500-400 MG CAPS Take 1  tablet by mouth daily.    Marland Kitchen levETIRAcetam (KEPPRA) 500 MG tablet TAKE 1 TABLET (500 MG TOTAL) BY MOUTH TWO TIMES DAILY. (Patient taking differently: Take 500 mg by mouth 2 (two) times daily.) 60 tablet 1  . melatonin 5 MG TABS Take 5 mg by mouth at bedtime.    . memantine (NAMENDA) 10 MG tablet Take 1 tablet twice a day (Patient taking differently: Take 10 mg by mouth 2 (two) times daily.) 180 tablet 3  . midodrine (PROAMATINE) 5 MG tablet Take 1 tablet (5 mg total) by mouth 3 (three) times daily with meals. 90 tablet 1  . MILK THISTLE PO Take 1 capsule by mouth  daily.    . Multiple Vitamins-Minerals (MULTIVITAMIN WITH MINERALS) tablet Take 1 tablet by mouth daily.    . polyethylene glycol (MIRALAX / GLYCOLAX) 17 g packet Take 17 g by mouth daily as needed for moderate constipation.    . RHOPRESSA 0.02 % SOLN Place 1 drop into both eyes at bedtime.    . risperiDONE (RISPERDAL) 0.5 MG tablet Take 1 tablet (0.5 mg total) by mouth at bedtime. 1 tablet 0     Discharge Medications: Please see discharge summary for a list of discharge medications.  Relevant Imaging Results:  Relevant Lab Results:   Additional Information SSN# [id number]  Susa Simmonds, LCSWA

## 2021-05-20 MED ORDER — TUBERCULIN PPD 5 UNIT/0.1ML ID SOLN
5.0000 [IU] | INTRADERMAL | Status: DC
  Administered 2021-05-20: 5 [IU] via INTRADERMAL
  Filled 2021-05-20: qty 0.1

## 2021-05-20 NOTE — Progress Notes (Signed)
Malena Peer with admissions states they cannot take pt tonight for placement. They will be able to accept pt in the morning. CSW to leave note for coworker to f/u with transition in the morning.

## 2021-05-20 NOTE — Telephone Encounter (Signed)
Spoke with Britta Mccreedy with [facility] advised that Dr Clent Ridges does not need to place another order for a hosp bed with full rail since pt already has a bed with half rails, state that they have opened a work order for [facility] technician to got switch out the bed in pt home. State that they will communicate with pt.

## 2021-05-20 NOTE — ED Notes (Signed)
TB injection administer 5/26 at 1115 to Left lower inner arm, pt tolerated well; to be read in 48 hours.

## 2021-05-20 NOTE — Progress Notes (Signed)
Family is unable to pay [facility] 6,650 per month. Family wants patient to go to [facility]. New insurance authorization has been started. CSW notified Valerie Roys that patient actually tested positive for COVID on 04/19/21. [facility] earlier this morning stated they could accept patient.

## 2021-05-20 NOTE — Progress Notes (Signed)
Lacinda Axon called CSW back and stated they will need to look at his medication to see if they can give CSW a definite yes for placement.

## 2021-05-20 NOTE — ED Provider Notes (Signed)
  Physical Exam  BP 108/67   Pulse 82   Temp 98.4 F (36.9 C) (Oral)   Resp 16   SpO2 99%   Physical Exam  ED Course/Procedures   Clinical Course as of 05/20/21 0920  Tue May 18, 2021  2031 Glori Luis): SMALL [JS]  2031 Bacteria, UA(!): FEW [JS]    Clinical Course User Index [JS] Claude Manges, PA-C    Procedures  MDM  Nursing home is requesting TB test.  Skin test placed.  Can be read Saturday morning now      Benjiman Core, MD 05/20/21 [phone]

## 2021-05-20 NOTE — Progress Notes (Signed)
Insurance auth approved for [employer]. [id number]. CSW contacted admissions. Admissions might be gone for the day.

## 2021-05-20 NOTE — ED Notes (Signed)
Patient is resting comfortably. 

## 2021-05-20 NOTE — Progress Notes (Signed)
CSW received a call back from [facility] stating that patient doesn't meet assisted living criteria and that he is a better fit for memory care. CSW was told patients wife would have to pay 6,650 a month. CSW stated she would discuss with the family. CSW told patient wife yesterday that [facility] not offer [facility]F.   CSW contacted patients wife and she did not answer.CSW spoke with patients daughter Velna Hatchet and explained everything that was going on and believes her mother was confused yesterday. CSW explained to the daughter that she let her mother know that [facility] was not a skilled [facility] facility but she still wanted her husband to go there. Velna Hatchet told CSW the reason for that is they were given [employer] and [facility] as options for SNF. Velna Hatchet stated they did not like the reviews and wanted to continue with [facility]. Velna Hatchet stated if her mother cannot pay the monthly fee then they will be taking patient home. CSW stated she can look at other SNF's but Velna Hatchet stated they are hesitate because of the reviews. Velna Hatchet stated she would let CSW know if they will be able to pay for [facility]. Velna Hatchet stated if they can pay the money she will be taking her father there.

## 2021-05-20 NOTE — Progress Notes (Addendum)
CSW contacted the following SNF's   [facility]- Declined [facility]- Not accepting anyone  [facility]- No beds  [facility]- Reviewing patient  [facility]- Reviewing patient  [facility]- Reviewing patient  [facility]- No beds [facility]- Not accepting anyone   [facility]- No beds  [facility]- Willing to accept patient but due to patient having covid previously that the facility will not take anyone until they are 21 days in the clear

## 2021-05-20 NOTE — Progress Notes (Signed)
FL2 and clinicals sent to [facility]. Wife stated she already picked out a room for patient and will be doing private pay. TB test was also ordered.

## 2021-05-21 LAB — URINE CULTURE: Culture: >=100000 — AB

## 2021-05-21 NOTE — Telephone Encounter (Signed)
Noted  

## 2021-05-21 NOTE — ED Notes (Signed)
Report called to Samantha Crimes., LPN of [facility]

## 2021-05-21 NOTE — ED Provider Notes (Addendum)
12:19 PM Was informed the patient has been accepted to a rehab facility.  I assessed the patient he is resting comfortably with no complaints at this time in regards to chest pain, shortness breath, nausea, vomiting,.  Initially, it was suspected that patient would need EMTALA documentation filled out as a transfer but then after speaking with case management/social work, it is a discharge to the facility instead of transfer so we do not need to do EMTALA.  Patient agrees with rehab facility and will be discharged.   Venesha Petraitis, Canary Brim, MD 05/21/21 1220    Amiel Mccaffrey, Canary Brim, MD 05/21/21 [phone]

## 2021-05-22 ENCOUNTER — Telehealth: Payer: Self-pay | Admitting: Emergency Medicine

## 2021-05-22 NOTE — Telephone Encounter (Signed)
Post ED Visit - Positive Culture Follow-up  Culture report reviewed by antimicrobial stewardship pharmacist: [facility] []  , Pharm.D. []  Enzo Bi, Pharm.D., BCPS AQ-ID []  , Pharm.D., BCPS []  Celedonio Miyamoto, Pharm.D., BCPS []  [place], Garvin Fila.D., BCPS, AAHIVP []  , Pharm.D., BCPS, AAHIVP []  Georgina Pillion, PharmD, BCPS []  , PharmD, BCPS []  [place], PharmD, BCPS [x]  [place], PharmD []  , PharmD, BCPS []  Estella Husk, PharmD  Pharmacy Team []  Lysle Pearl, PharmD []  , PharmD []  Phillips Climes, PharmD []  , Rph []  Agapito Games) , PharmD []  Delmar Landau, PharmD []  , PharmD []  Mervyn Gay, PharmD []  , PharmD []  Vinnie Level, PharmD []  Wonda Olds, PharmD []  , PharmD []  Len Childs, PharmD   Positive urine culture No further patient follow-up is required at this time.  Shabre Kreher 05/22/2021, 2:01 PM

## 2021-05-31 ENCOUNTER — Telehealth: Payer: Self-pay | Admitting: Family Medicine

## 2021-05-31 NOTE — Telephone Encounter (Signed)
Left message for patient to call back and schedule Medicare Annual Wellness Visit (AWV) either virtually or in office.   AWV-I PER [place] 12/26/09 please schedule at anytime with [facility] Nurse Health Advisor 1 or 2   This should be a 45 minute visit.  

## 2021-06-23 ENCOUNTER — Ambulatory Visit: Payer: Medicare Other | Admitting: General Practice

## 2021-07-09 ENCOUNTER — Ambulatory Visit: Payer: Medicare Other | Admitting: Podiatry

## 2021-07-25 ENCOUNTER — Encounter: Payer: Self-pay | Admitting: Gastroenterology

## 2021-08-31 ENCOUNTER — Telehealth: Payer: Self-pay | Admitting: Neurology

## 2021-08-31 NOTE — Telephone Encounter (Signed)
Pt wife called no answer left a voice mail to call the office back  

## 2021-08-31 NOTE — Telephone Encounter (Signed)
We made medication changes for his seizure on last visit. Has he had any seizures? Any active issues, is he less drowsy with the Depakote? If he is doing better with no seizures, we can hold off on visit if she is comfortable with that. He can follow-up with PCP moving forward if PCP is comfortable prescribing the medications we prescribe, however if we will continue prescribing his medications, office policy is we need to see him at least once a year. Thanks.

## 2021-08-31 NOTE — Telephone Encounter (Signed)
Pt wife wants to know if aquino still wants to see [name] [name] on the 19th of sept. He is in a rest home, so she wants to make sure before sending him here. She said you can leave a message on her VM

## 2021-09-02 NOTE — Telephone Encounter (Signed)
Pt wife is going to ask PCP if they are OK prescribing medications that Dr Karel Jarvis gives the pt if they are then she will call back and cancel his appointment.

## 2021-09-13 ENCOUNTER — Ambulatory Visit: Payer: Medicare Other | Admitting: Neurology

## 2021-10-20 ENCOUNTER — Inpatient Hospital Stay (HOSPITAL_COMMUNITY)
Admission: EM | Admit: 2021-10-20 | Discharge: 2021-11-05 | DRG: 177 | Disposition: A | Discharge status: Skilled Nursing Facility | Payer: Medicare Other | Source: Skilled Nursing Facility | Attending: Internal Medicine | Admitting: Internal Medicine

## 2021-10-20 ENCOUNTER — Emergency Department (HOSPITAL_COMMUNITY): Payer: Medicare Other

## 2021-10-20 DIAGNOSIS — I452 Bifascicular block: Secondary | ICD-10-CM | POA: Diagnosis present

## 2021-10-20 DIAGNOSIS — E785 Hyperlipidemia, unspecified: Secondary | ICD-10-CM | POA: Diagnosis present

## 2021-10-20 DIAGNOSIS — M7989 Other specified soft tissue disorders: Secondary | ICD-10-CM | POA: Diagnosis not present

## 2021-10-20 DIAGNOSIS — J159 Unspecified bacterial pneumonia: Secondary | ICD-10-CM | POA: Diagnosis present

## 2021-10-20 DIAGNOSIS — L89611 Pressure ulcer of right heel, stage 1: Secondary | ICD-10-CM | POA: Clinically undetermined

## 2021-10-20 DIAGNOSIS — J9601 Acute respiratory failure with hypoxia: Secondary | ICD-10-CM | POA: Diagnosis present

## 2021-10-20 DIAGNOSIS — R1312 Dysphagia, oropharyngeal phase: Secondary | ICD-10-CM | POA: Diagnosis present

## 2021-10-20 DIAGNOSIS — J690 Pneumonitis due to inhalation of food and vomit: Secondary | ICD-10-CM | POA: Diagnosis present

## 2021-10-20 DIAGNOSIS — J1282 Pneumonia due to coronavirus disease 2019: Secondary | ICD-10-CM | POA: Diagnosis present

## 2021-10-20 DIAGNOSIS — Z7189 Other specified counseling: Secondary | ICD-10-CM | POA: Diagnosis not present

## 2021-10-20 DIAGNOSIS — R627 Adult failure to thrive: Secondary | ICD-10-CM | POA: Diagnosis present

## 2021-10-20 DIAGNOSIS — N400 Enlarged prostate without lower urinary tract symptoms: Secondary | ICD-10-CM | POA: Diagnosis present

## 2021-10-20 DIAGNOSIS — Z801 Family history of malignant neoplasm of trachea, bronchus and lung: Secondary | ICD-10-CM | POA: Diagnosis not present

## 2021-10-20 DIAGNOSIS — R569 Unspecified convulsions: Secondary | ICD-10-CM

## 2021-10-20 DIAGNOSIS — R54 Age-related physical debility: Secondary | ICD-10-CM | POA: Diagnosis present

## 2021-10-20 DIAGNOSIS — Z8616 Personal history of COVID-19: Secondary | ICD-10-CM | POA: Diagnosis not present

## 2021-10-20 DIAGNOSIS — Z515 Encounter for palliative care: Secondary | ICD-10-CM

## 2021-10-20 DIAGNOSIS — Z8249 Family history of ischemic heart disease and other diseases of the circulatory system: Secondary | ICD-10-CM

## 2021-10-20 DIAGNOSIS — D638 Anemia in other chronic diseases classified elsewhere: Secondary | ICD-10-CM | POA: Diagnosis present

## 2021-10-20 DIAGNOSIS — Z87891 Personal history of nicotine dependence: Secondary | ICD-10-CM

## 2021-10-20 DIAGNOSIS — I10 Essential (primary) hypertension: Secondary | ICD-10-CM | POA: Diagnosis present

## 2021-10-20 DIAGNOSIS — L89621 Pressure ulcer of left heel, stage 1: Secondary | ICD-10-CM | POA: Clinically undetermined

## 2021-10-20 DIAGNOSIS — F01518 Vascular dementia, unspecified severity, with other behavioral disturbance: Secondary | ICD-10-CM | POA: Diagnosis not present

## 2021-10-20 DIAGNOSIS — R4189 Other symptoms and signs involving cognitive functions and awareness: Secondary | ICD-10-CM | POA: Diagnosis present

## 2021-10-20 DIAGNOSIS — U071 COVID-19: Principal | ICD-10-CM | POA: Diagnosis present

## 2021-10-20 DIAGNOSIS — J9691 Respiratory failure, unspecified with hypoxia: Secondary | ICD-10-CM | POA: Diagnosis present

## 2021-10-20 DIAGNOSIS — J189 Pneumonia, unspecified organism: Secondary | ICD-10-CM | POA: Diagnosis present

## 2021-10-20 DIAGNOSIS — Z9842 Cataract extraction status, left eye: Secondary | ICD-10-CM

## 2021-10-20 DIAGNOSIS — G40909 Epilepsy, unspecified, not intractable, without status epilepticus: Secondary | ICD-10-CM | POA: Diagnosis present

## 2021-10-20 DIAGNOSIS — Z4659 Encounter for fitting and adjustment of other gastrointestinal appliance and device: Secondary | ICD-10-CM

## 2021-10-20 DIAGNOSIS — Z0189 Encounter for other specified special examinations: Secondary | ICD-10-CM

## 2021-10-20 DIAGNOSIS — F0390 Unspecified dementia without behavioral disturbance: Secondary | ICD-10-CM | POA: Diagnosis present

## 2021-10-20 DIAGNOSIS — R1310 Dysphagia, unspecified: Secondary | ICD-10-CM | POA: Diagnosis not present

## 2021-10-20 DIAGNOSIS — Z9841 Cataract extraction status, right eye: Secondary | ICD-10-CM

## 2021-10-20 LAB — CBC WITH DIFFERENTIAL/PLATELET
Abs Immature Granulocytes: 0 10*3/uL (ref 0.00–0.07)
Basophils Absolute: 0 10*3/uL (ref 0.0–0.1)
Basophils Relative: 1 %
Eosinophils Absolute: 0 10*3/uL (ref 0.0–0.5)
Eosinophils Relative: 0 %
HCT: 35.7 % — ABNORMAL LOW (ref 39.0–52.0)
Hemoglobin: 11.1 g/dL — ABNORMAL LOW (ref 13.0–17.0)
Lymphocytes Relative: 5 %
Lymphs Abs: 0.2 10*3/uL — ABNORMAL LOW (ref 0.7–4.0)
MCH: 27.3 pg (ref 26.0–34.0)
MCHC: 31.1 g/dL (ref 30.0–36.0)
MCV: 87.7 fL (ref 80.0–100.0)
Metamyelocytes Relative: 1 %
Monocytes Absolute: 0.8 10*3/uL (ref 0.1–1.0)
Monocytes Relative: 18 %
Neutro Abs: 3.5 10*3/uL (ref 1.7–7.7)
Neutrophils Relative %: 75 %
Platelets: 217 10*3/uL (ref 150–400)
RBC: 4.07 MIL/uL — ABNORMAL LOW (ref 4.22–5.81)
RDW: 13.7 % (ref 11.5–15.5)
WBC: 4.7 10*3/uL (ref 4.0–10.5)
nRBC: 0 % (ref 0.0–0.2)
nRBC: 1 /100 WBC — ABNORMAL HIGH

## 2021-10-20 LAB — COMPREHENSIVE METABOLIC PANEL
ALT: 28 U/L (ref 0–44)
AST: 26 U/L (ref 15–41)
Albumin: 2 g/dL — ABNORMAL LOW (ref 3.5–5.0)
Alkaline Phosphatase: 64 U/L (ref 38–126)
Anion gap: 7 (ref 5–15)
BUN: 27 mg/dL — ABNORMAL HIGH (ref 8–23)
CO2: 27 mmol/L (ref 22–32)
Calcium: 8.1 mg/dL — ABNORMAL LOW (ref 8.9–10.3)
Chloride: 107 mmol/L (ref 98–111)
Creatinine, Ser: 1.09 mg/dL (ref 0.61–1.24)
GFR, Estimated: >60 mL/min (ref >60)
Glucose, Bld: 125 mg/dL — ABNORMAL HIGH (ref 70–99)
Potassium: 3.9 mmol/L (ref 3.5–5.1)
Sodium: 141 mmol/L (ref 135–145)
Total Bilirubin: 0.6 mg/dL (ref 0.3–1.2)
Total Protein: 5.2 g/dL — ABNORMAL LOW (ref 6.5–8.1)

## 2021-10-20 LAB — RESP PANEL BY RT-PCR (FLU A&B, COVID) ARPGX2
Influenza A by PCR: NEGATIVE
Influenza B by PCR: NEGATIVE
SARS Coronavirus 2 by RT PCR: POSITIVE — AB

## 2021-10-20 LAB — URINALYSIS, ROUTINE W REFLEX MICROSCOPIC
Bilirubin Urine: NEGATIVE
Glucose, UA: NEGATIVE mg/dL
Hgb urine dipstick: NEGATIVE
Ketones, ur: 20 mg/dL — AB
Leukocytes,Ua: NEGATIVE
Nitrite: NEGATIVE
Protein, ur: NEGATIVE mg/dL
Specific Gravity, Urine: 1.033 — ABNORMAL HIGH (ref 1.005–1.030)
pH: 5 (ref 5.0–8.0)

## 2021-10-20 LAB — LACTIC ACID, PLASMA: Lactic Acid, Venous: 1.4 mmol/L (ref 0.5–1.9)

## 2021-10-20 LAB — PROTIME-INR
INR: 1.3 — ABNORMAL HIGH (ref 0.8–1.2)
Prothrombin Time: 15.8 seconds — ABNORMAL HIGH (ref 11.4–15.2)

## 2021-10-20 LAB — APTT: aPTT: 43 seconds — ABNORMAL HIGH (ref 24–36)

## 2021-10-20 MED ORDER — PIPERACILLIN-TAZOBACTAM 3.375 G IVPB 30 MIN
3.3750 g | Freq: Once | INTRAVENOUS | Status: AC
  Administered 2021-10-20: 3.375 g via INTRAVENOUS
  Filled 2021-10-20: qty 50

## 2021-10-20 NOTE — ED Provider Notes (Signed)
  Provider Note MRN:  [id number]  Arrival date & time: 10/20/21    ED Course and Medical Decision Making  Assumed care from Dr. Clarice Pole at shift change.  Aspiration, arrived with respiratory distress, improved but requiring 2 L nasal cannula, x-ray with evidence of pneumonia, also is COVID-positive.  Will admit to medicine.  Procedures  Final Clinical Impressions(s) / ED Diagnoses     ICD-10-CM   1. Aspiration pneumonia of both lower lobes, unspecified aspiration pneumonia type (HCC)  J69.0     2. COVID  U07.1     3. Dementia, unspecified dementia severity, unspecified dementia type, unspecified whether behavioral, psychotic, or mood disturbance or anxiety (HCC)  F03.90       ED Discharge Orders     None       Discharge Instructions   None     Elmer Sow. Pilar Plate, MD [facility] [email or web address]    Sabas Sous, MD 10/21/21 [phone]

## 2021-10-20 NOTE — ED Triage Notes (Signed)
EMS arrival. On 2L [place]. Possible aspiration, found by staff at [facility]. Dementia at baseline.

## 2021-10-20 NOTE — ED Provider Notes (Signed)
[facility] EMERGENCY DEPARTMENT Provider Note   CSN: [id number] Arrival date & time: 10/20/21  2015     History Chief Complaint  Patient presents with   Aspiration    [name] [name] is a 86 y.o. male.  HPI Patient sent by EMS from SNF.  EMS reports limited amount of information given on the patient's baseline.  Reported baseline is verbally interactive with staff.  Reportedly staff member found the patient very poorly responsive with respiratory distress.  No additional immediate history at this time.  MS reports during transport patient was having respiratory distress with rhonchi in copious secretions with coughing.  He was not interacting verbally.    Past Medical History:  Diagnosis Date   Allergy    Dementia [facility])    sees Dr. Patrcia Dolly    Diverticulosis of colon (without mention of hemorrhage) 03-01-2004, 04-04-2011   Colonoscopy   ED (erectile dysfunction)    Glaucoma    sees Dr. Arvil Chaco    Hyperglycemia    Hyperlipidemia    Hypertension    Internal hemorrhoids 03-15-1999   Flex Sig    Irritable bowel     Patient Active Problem List   Diagnosis Date Noted   Pressure injury of skin 05/11/2021   Pericardial effusion, acute    COVID    Palliative care by specialist    Goals of care, counseling/discussion    General weakness    Syncope 05/07/2021   Seizure (HCC) 03/09/2021   Dementia (HCC) 02/25/2021   BPH with urinary obstruction 12/22/2020   Ankle edema, bilateral 07/13/2020   Hallucinations 02/09/2020   Abdominal pain, acute 08/21/2014   GI bleeding 05/26/2012   Renal mass 05/26/2012   Diverticulosis of colon with hemorrhage 05/26/2012   SHOULDER PAIN, BILATERAL 07/30/2010   HERPES SIMPLEX INFECTION 10/10/2007   TACHYCARDIA, PAROXYSMAL NOS 10/10/2007   Dyslipidemia 07/11/2007   Essential hypertension 07/11/2007   ALLERGIC RHINITIS 07/11/2007    Past Surgical History:  Procedure Laterality Date   COLONOSCOPY  04-04-11    per Dr. Jarold Motto, clear, no repeats needed   EYE SURGERY     bilateral cataract extraction per Dr Jettie Pagan   INGUINAL HERNIA REPAIR     KNEE ARTHROSCOPY     right knee   NASAL SINUS SURGERY         Family History  Problem Relation Age of Onset   Hypertension Mother    Lung cancer Father    Colitis Daughter     Social History   Tobacco Use   Smoking status: Former    Types: Cigarettes    Quit date: 03/20/1969    Years since quitting: 52.6   Smokeless tobacco: Never  Vaping Use   Vaping Use: Never used  Substance Use Topics   Alcohol use: Yes    Alcohol/week: 0.0 standard drinks    Comment: occ   Drug use: No    Home Medications Prior to Admission medications   Medication Sig Start Date End Date Taking? Authorizing Provider  AZOPT 1 % ophthalmic suspension Place 1 drop into both eyes 3 (three) times daily. 12/18/19   [provider]  bimatoprost (LUMIGAN) 0.01 % SOLN Place 1 drop into both eyes at bedtime.    [provider]  cetirizine (ZYRTEC) 10 MG tablet Take 10 mg by mouth daily as needed for allergies.    [provider]  Coenzyme Q10 (CO Q 10 PO) Take 1 capsule by mouth daily.    [provider]  divalproex (DEPAKOTE ER) 500 MG 24 hr tablet Take 2 tablets (1,000 mg total) by mouth daily. Take 1 tablet every night Patient taking differently: Take 500 mg by mouth at bedtime. 05/12/21   [name], MD  donepezil (ARICEPT) 10 MG tablet Take 10 mg by mouth at bedtime. 03/06/21   [provider]  fish oil-omega-3 fatty acids 1000 MG capsule Take 1 g by mouth daily.    [provider]  Glucosamine-Chondroitin 500-400 MG CAPS Take 1 tablet by mouth daily.    [provider]  levETIRAcetam (KEPPRA) 500 MG tablet TAKE 1 TABLET (500 MG TOTAL) BY MOUTH TWO TIMES DAILY. Patient taking differently: Take 500 mg by mouth 2 (two) times daily. 03/11/21 03/11/22  [name] Bong, MD  melatonin 5 MG TABS Take 5 mg by  mouth at bedtime.    [provider]  memantine (NAMENDA) 10 MG tablet Take 1 tablet twice a day Patient taking differently: No sig reported 03/22/21   Van Clines, MD  midodrine (PROAMATINE) 5 MG tablet Take 1 tablet (5 mg total) by mouth 3 (three) times daily with meals. 05/12/21   [name], MD  MILK THISTLE PO Take 1 capsule by mouth daily.    [provider]  Multiple Vitamins-Minerals (MULTIVITAMIN WITH MINERALS) tablet Take 1 tablet by mouth daily.    [provider]  polyethylene glycol (MIRALAX / GLYCOLAX) 17 g packet Take 17 g by mouth daily as needed for moderate constipation.    [provider]  RHOPRESSA 0.02 % SOLN Place 1 drop into both eyes at bedtime. 08/17/19   [provider]  risperiDONE (RISPERDAL) 0.5 MG tablet Take 1 tablet (0.5 mg total) by mouth at bedtime. 02/25/20   [name], MD    Allergies    Chocolate, Ciprofloxacin, and Lipitor [atorvastatin]  Review of Systems   Review of Systems Level 5 caveat cannot obtain review of systems due to patient condition. Physical Exam Updated Vital Signs BP 124/88   Pulse 99   Temp 98.3 F (36.8 C) (Rectal)   Resp (!) 24   SpO2 94%   Physical Exam Constitutional:      Comments: Patient is very ill in appearance.  Coughing and gurgling with extensive respiratory secretions.  HENT:     Mouth/Throat:     Comments: Airway moist but repeatedly producing pale yellow moderate thickness secretions. Eyes:     Extraocular Movements: Extraocular movements intact.  Cardiovascular:     Comments: Heart regular.  Distant heart sounds Pulmonary:     Comments: Very wet cough productive of mucus.  Rhonchorous sounds in the upper airways.  Distal airways without significant wheeze or crackle. Abdominal:     General: There is no distension.     Palpations: Abdomen is soft.     Tenderness: There is no abdominal tenderness. There is no guarding.  Genitourinary:    Comments:  Rectal with soft brown stool.  No hard impactions.  No melena. Musculoskeletal:     Comments: Back and buttocks examined.  No wounds present.  Lower extremities without significant peripheral edema.  No wounds or breakdown on the feet.  Skin:    General: Skin is warm and dry.  Neurological:     Comments: Patient not responding verbally.  He does moan and object somewhat to interventions such as nasal suctioning.  He will try to reach but is very weak.  Patient does purse his lips around the suction catheter to  expectorate.  He cannot follow any commands.    ED Results / Procedures / Treatments   Labs (all labs ordered are listed, but only abnormal results are displayed) Labs Reviewed  RESP PANEL BY RT-PCR (FLU A&B, COVID) ARPGX2 - Abnormal; Notable for the following components:      Result Value   SARS Coronavirus 2 by RT PCR POSITIVE (*)    All other components within normal limits  COMPREHENSIVE METABOLIC PANEL - Abnormal; Notable for the following components:   Glucose, Bld 125 (*)    BUN 27 (*)    Calcium 8.1 (*)    Total Protein 5.2 (*)    Albumin 2.0 (*)    All other components within normal limits  CBC WITH DIFFERENTIAL/PLATELET - Abnormal; Notable for the following components:   RBC 4.07 (*)    Hemoglobin 11.1 (*)    HCT 35.7 (*)    Lymphs Abs 0.2 (*)    nRBC 1 (*)    All other components within normal limits  PROTIME-INR - Abnormal; Notable for the following components:   Prothrombin Time 15.8 (*)    INR 1.3 (*)    All other components within normal limits  APTT - Abnormal; Notable for the following components:   aPTT 43 (*)    All other components within normal limits  CULTURE, BLOOD (SINGLE)  URINE CULTURE  LACTIC ACID, PLASMA  URINALYSIS, ROUTINE W REFLEX MICROSCOPIC    EKG EKG Interpretation  Date/Time:  Wednesday October 20 2021 20:15:38 EDT Ventricular Rate:  99 PR Interval:  181 QRS Duration: 160 QT Interval:  405 QTC Calculation: 520 R  Axis:   -15 Text Interpretation: Sinus rhythm Right bundle branch block old RBBB. rate increased otherwise no sig change from previous Confirmed by Arby Barrette [phone]) on 10/20/2021 10:39:52 PM  Radiology DG Chest Port 1 View  Result Date: 10/20/2021 CLINICAL DATA:  Questionable sepsis. EXAM: PORTABLE CHEST 1 VIEW COMPARISON:  Chest x-ray 05/18/2021. FINDINGS: There is patchy multifocal airspace disease throughout the right mid and lower lung. There is airspace consolidation in the left lower lung with small left pleural effusion. There is no evidence for pneumothorax. The heart is enlarged, unchanged. No acute fractures are seen. IMPRESSION: 1. Bilateral lower lung airspace disease, left greater than right, concerning for infection. 2. Small left pleural effusion. 3. Stable cardiomegaly. Electronically Signed   By: Darliss Cheney M.D.   On: 10/20/2021 20:51    Procedures Procedures  CRITICAL CARE Performed by: Arby Barrette   Total critical care time: 30 minutes  Critical care time was exclusive of separately billable procedures and treating other patients.  Critical care was necessary to treat or prevent imminent or life-threatening deterioration.  Critical care was time spent personally by me on the following activities: development of treatment plan with patient and/or surrogate as well as nursing, discussions with consultants, evaluation of patient's response to treatment, examination of patient, obtaining history from patient or surrogate, ordering and performing treatments and interventions, ordering and review of laboratory studies, ordering and review of radiographic studies, pulse oximetry and re-evaluation of patient's condition.  Medications Ordered in ED Medications  piperacillin-tazobactam (ZOSYN) IVPB 3.375 g (has no administration in time range)    ED Course  I have reviewed the triage vital signs and the nursing notes.  Pertinent labs & imaging results that were  available during my care of the patient were reviewed by me and considered in my medical decision making (see chart for details).  MDM Rules/Calculators/A&P                           Patient presents as outlined.  There is limited history as to the timing of symptoms onset.  Reportedly patient was found with fairly acute change.  Patient has extremely copious secretions with cough.  Respiratory suction both with a nasal deep suction catheter and [place]auer.  Secretions are fairly thin and yellow.  Chest x-ray identifies consolidations.  At this time with clinical picture plus x-ray, treatment initiated for aspiration pneumonia.  Patient does test COVID-positive.  Unclear when symptoms onset may have been.  Copious secretions without fever seems atypical for COVID although perhaps patient had COVID and now has secondary pneumonia.  Currently patient is not meeting SIRS criteria.  Aggressive fluid hydration not initiated at this time.  Patient is not currently febrile.  Patient will need admission for respiratory distress, mental status change and aspiration pneumonia. Final Clinical Impression(s) / ED Diagnoses Final diagnoses:  Aspiration pneumonia of both lower lobes, unspecified aspiration pneumonia type (HCC)  COVID  Dementia, unspecified dementia severity, unspecified dementia type, unspecified whether behavioral, psychotic, or mood disturbance or anxiety [facility])    Rx / DC Orders ED Discharge Orders     None        Arby Barrette, MD 10/20/21 2243

## 2021-10-21 ENCOUNTER — Encounter (HOSPITAL_COMMUNITY): Payer: Self-pay | Admitting: Internal Medicine

## 2021-10-21 DIAGNOSIS — J189 Pneumonia, unspecified organism: Secondary | ICD-10-CM | POA: Diagnosis not present

## 2021-10-21 DIAGNOSIS — U071 COVID-19: Secondary | ICD-10-CM | POA: Diagnosis not present

## 2021-10-21 LAB — CBG MONITORING, ED: Glucose-Capillary: 106 mg/dL — ABNORMAL HIGH (ref 70–99)

## 2021-10-21 LAB — CBC WITH DIFFERENTIAL/PLATELET
Abs Immature Granulocytes: 0.1 10*3/uL — ABNORMAL HIGH (ref 0.00–0.07)
Basophils Absolute: 0 10*3/uL (ref 0.0–0.1)
Basophils Relative: 0 %
Eosinophils Absolute: 0.1 10*3/uL (ref 0.0–0.5)
Eosinophils Relative: 2 %
HCT: 38.9 % — ABNORMAL LOW (ref 39.0–52.0)
Hemoglobin: 12.4 g/dL — ABNORMAL LOW (ref 13.0–17.0)
Lymphocytes Relative: 13 %
Lymphs Abs: 0.7 10*3/uL (ref 0.7–4.0)
MCH: 27 pg (ref 26.0–34.0)
MCHC: 31.9 g/dL (ref 30.0–36.0)
MCV: 84.7 fL (ref 80.0–100.0)
Monocytes Absolute: 1 10*3/uL (ref 0.1–1.0)
Monocytes Relative: 17 %
Neutro Abs: 3.7 10*3/uL (ref 1.7–7.7)
Neutrophils Relative %: 66 %
Platelets: 248 10*3/uL (ref 150–400)
Promyelocytes Relative: 2 %
RBC: 4.59 MIL/uL (ref 4.22–5.81)
RDW: 13.6 % (ref 11.5–15.5)
WBC: 5.6 10*3/uL (ref 4.0–10.5)
nRBC: 0 % (ref 0.0–0.2)
nRBC: 0 /100 WBC

## 2021-10-21 LAB — COMPREHENSIVE METABOLIC PANEL
ALT: 32 U/L (ref 0–44)
AST: 26 U/L (ref 15–41)
Albumin: 2.2 g/dL — ABNORMAL LOW (ref 3.5–5.0)
Alkaline Phosphatase: 73 U/L (ref 38–126)
Anion gap: 7 (ref 5–15)
BUN: 24 mg/dL — ABNORMAL HIGH (ref 8–23)
CO2: 29 mmol/L (ref 22–32)
Calcium: 8.9 mg/dL (ref 8.9–10.3)
Chloride: 105 mmol/L (ref 98–111)
Creatinine, Ser: 1.11 mg/dL (ref 0.61–1.24)
GFR, Estimated: >60 mL/min (ref >60)
Glucose, Bld: 107 mg/dL — ABNORMAL HIGH (ref 70–99)
Potassium: 4 mmol/L (ref 3.5–5.1)
Sodium: 141 mmol/L (ref 135–145)
Total Bilirubin: 0.4 mg/dL (ref 0.3–1.2)
Total Protein: 5.9 g/dL — ABNORMAL LOW (ref 6.5–8.1)

## 2021-10-21 LAB — URINE CULTURE

## 2021-10-21 MED ORDER — ACETAMINOPHEN 325 MG PO TABS
650.0000 mg | ORAL_TABLET | Freq: Four times a day (QID) | ORAL | Status: DC | PRN
  Administered 2021-10-23 – 2021-10-29 (×3): 650 mg via ORAL
  Filled 2021-10-21 (×3): qty 2

## 2021-10-21 MED ORDER — LATANOPROST 0.005 % OP SOLN
1.0000 [drp] | Freq: Every day | OPHTHALMIC | Status: DC
  Administered 2021-10-21 – 2021-11-04 (×15): 1 [drp] via OPHTHALMIC
  Filled 2021-10-21: qty 2.5

## 2021-10-21 MED ORDER — LACTATED RINGERS IV SOLN: INTRAVENOUS | Status: DC

## 2021-10-21 MED ORDER — ORAL CARE MOUTH RINSE
15.0000 mL | Freq: Two times a day (BID) | OROMUCOSAL | Status: DC
  Administered 2021-10-21 – 2021-11-05 (×30): 15 mL via OROMUCOSAL

## 2021-10-21 MED ORDER — SODIUM CHLORIDE 0.9 % IV SOLN: INTRAVENOUS | Status: DC | PRN

## 2021-10-21 MED ORDER — LEVETIRACETAM IN NACL 1000 MG/100ML IV SOLN
1000.0000 mg | Freq: Two times a day (BID) | INTRAVENOUS | Status: DC
  Administered 2021-10-21: 1000 mg via INTRAVENOUS
  Filled 2021-10-21: qty 100

## 2021-10-21 MED ORDER — SODIUM CHLORIDE 0.9 % IV SOLN
750.0000 mg | Freq: Two times a day (BID) | INTRAVENOUS | Status: DC
  Administered 2021-10-21 – 2021-10-23 (×5): 750 mg via INTRAVENOUS
  Filled 2021-10-21 (×6): qty 7.5

## 2021-10-21 MED ORDER — ENOXAPARIN SODIUM 40 MG/0.4ML IJ SOSY
40.0000 mg | PREFILLED_SYRINGE | INTRAMUSCULAR | Status: DC
  Administered 2021-10-21 – 2021-11-05 (×16): 40 mg via SUBCUTANEOUS
  Filled 2021-10-21 (×16): qty 0.4

## 2021-10-21 MED ORDER — BRINZOLAMIDE 1 % OP SUSP
1.0000 [drp] | Freq: Three times a day (TID) | OPHTHALMIC | Status: DC
  Administered 2021-10-21 – 2021-11-05 (×44): 1 [drp] via OPHTHALMIC
  Filled 2021-10-21 (×3): qty 10

## 2021-10-21 MED ORDER — NETARSUDIL DIMESYLATE 0.02 % OP SOLN: 1.0000 [drp] | Freq: Every day | OPHTHALMIC | Status: DC

## 2021-10-21 MED ORDER — CHLORHEXIDINE GLUCONATE 0.12 % MT SOLN
15.0000 mL | Freq: Two times a day (BID) | OROMUCOSAL | Status: DC
  Administered 2021-10-21 – 2021-11-05 (×29): 15 mL via OROMUCOSAL
  Filled 2021-10-21 (×27): qty 15

## 2021-10-21 MED ORDER — REMDESIVIR 100 MG IV SOLR
100.0000 mg | Freq: Every day | INTRAVENOUS | Status: AC
Start: 2021-10-22 — End: 2021-10-25
  Administered 2021-10-22 – 2021-10-25 (×4): 100 mg via INTRAVENOUS
  Filled 2021-10-21 (×4): qty 20

## 2021-10-21 MED ORDER — ACETAMINOPHEN 650 MG RE SUPP: 650.0000 mg | Freq: Four times a day (QID) | RECTAL | Status: DC | PRN

## 2021-10-21 MED ORDER — PIPERACILLIN-TAZOBACTAM 3.375 G IVPB
3.3750 g | Freq: Three times a day (TID) | INTRAVENOUS | Status: DC
  Administered 2021-10-21 – 2021-10-28 (×21): 3.375 g via INTRAVENOUS
  Filled 2021-10-21 (×22): qty 50

## 2021-10-21 MED ORDER — SODIUM CHLORIDE 0.9 % IV SOLN
200.0000 mg | Freq: Once | INTRAVENOUS | Status: AC
  Administered 2021-10-21: 200 mg via INTRAVENOUS
  Filled 2021-10-21: qty 40

## 2021-10-21 NOTE — Progress Notes (Signed)
Pharmacy Antibiotic Note  [name] [name] is a 86 y.o. male admitted on 10/20/2021 with Covid and aspiration pneumonia.  Pharmacy has been consulted for Zosyn dosing.  Plan: Zosyn 3.375g IV q8h (4 hour infusion).  Height: 6\' 1"  (185.4 cm) Weight: 72.6 kg (160 lb) IBW/kg (Calculated) : 79.9  Temp (24hrs), Avg:98.3 F (36.8 C), Min:98.3 F (36.8 C), Max:98.3 F (36.8 C)  Recent Labs  Lab 10/20/21 2029  WBC 4.7  CREATININE 1.09  LATICACIDVEN 1.4    Estimated Creatinine Clearance: 51.8 mL/min (by C-G formula based on SCr of 1.09 mg/dL).    Allergies  Allergen Reactions   Chocolate     Itching if he eats too much chocolate    Ciprofloxacin     Achilles tendon pain    Lipitor [Atorvastatin]     Muscle aches     Thank you for allowing pharmacy to be a part of this patient's care.  2030, PharmD, BCPS  10/21/2021 2:39 AM

## 2021-10-21 NOTE — Progress Notes (Signed)
   10/21/21 1442  Assess: MEWS Score  Temp 99.4 F (37.4 C)  BP (!) 147/88  Pulse Rate 88  Resp 20  Level of Consciousness Responds to Pain  SpO2 94 %  O2 Device Nasal Cannula  O2 Flow Rate (L/min) 2 L/min  Assess: MEWS Score  MEWS Temp 0  MEWS Systolic 0  MEWS Pulse 0  MEWS RR 0  MEWS LOC 2  MEWS Score 2  MEWS Score Color Yellow  Assess: if the MEWS score is Yellow or Red  Were vital signs taken at a resting state? No  Focused Assessment No change from prior assessment  Early Detection of Sepsis Score *See Row Information* Low  MEWS guidelines implemented *See Row Information* Yes  Treat  Pain Scale PAINAD  Take Vital Signs  Increase Vital Sign Frequency  Yellow: Q 2hr X 2 then Q 4hr X 2, if remains yellow, continue Q 4hrs  Escalate  MEWS: Escalate Yellow: discuss with charge nurse/RN and consider discussing with provider and RRT  Notify: Charge Nurse/RN  Name of Charge Nurse/RN Notified Amrah, RN  Date Charge Nurse/RN Notified 10/21/21  Time Charge Nurse/RN Notified 1547  Notify: Provider  Provider Name/Title Corrie Mckusick MD  Date Provider Notified 10/21/21  Time Provider Notified 1550  Notification Type Page  Notification Reason Other (Comment) (Yellow MEWS)  Provider response No new orders  Date of Provider Response 10/21/21  Time of Provider Response 1550  Document  Patient Outcome Other (Comment) (Patient is stable.)  Progress note created (see row info) Yes

## 2021-10-21 NOTE — Progress Notes (Signed)
86 year old with dementia, seizure disorder, BPH, GI bleeding brought to the emergency room with sudden onset of shortness of breath and rhonchi noted at nursing home.  He is a long-term nursing home resident.  Found to have bilateral lower lobe pneumonia as well as positive COVID-19 test.  He was tested positive for COVID in May of this year, he had negative test in between.  Treated with Zosyn as aspiration pneumonia as well as remdesivir.  Patient seen and examined.  Admitted by nighttime hospitalist early morning hours.  He remains quite somnolent, has plenty of secretions and unable to clear up his secretions.  Try to call and discuss with his wife, unable to pick up the phone.  Will stay NPO.  IV antibiotics and IV fluids.  Speech evaluation once he is more able to participate.   Same-day admission.  No charge visit.

## 2021-10-21 NOTE — Progress Notes (Signed)
SLP Cancellation Note  Patient Details Name: [name] [name] MRN: [id number] DOB: Feb 25, 1935   Cancelled treatment:       Reason Eval/Treat Not Completed: Medical issues which prohibited therapy  Unable to complete BSE at this time, as pt is currently lethargic and having difficulty managing his secretions. MD and RN aware. Will try again next date.   [name] [name], [facility], CCC-SLP Speech Language Pathologist Office: [phone]  Leigh Aurora 10/21/2021, 10:27 AM

## 2021-10-21 NOTE — H&P (Signed)
History and Physical    [name] [name] GMW:[id number] DOB: [birth year] DOA: 10/20/2021  PCP: Karna Dupes, MD  Patient coming from: Skilled nursing facility.  Chief Complaint: Respiratory distress.  HPI: [name] [name] is a 86 y.o. male with history of dementia, seizures, BPH, GI bleed was brought to the ER after patient was found to be in respiratory distress with rhonchi and coughing up.  There was some concern for acute aspiration.  Patient was requiring 2 L oxygen to maintain sats.  Patient has been COVID-positive during the admission in May of this year.  ED Course: In the ER chest x-ray showed bilateral infiltrates.  Patient's COVID test also came back positive.  Hemoglobin is 11.1 lactic acid 1.4.  Patient was started on empiric antibiotics for aspiration pneumonia.  Patient will need further work-up and admitted for further management.  On my exam patient is only responding to calling his name.  Appears rhonchorous on exam.  Review of Systems: As per HPI, rest all negative.   Past Medical History:  Diagnosis Date   Allergy    Dementia [facility])    sees Dr. Patrcia Dolly    Diverticulosis of colon (without mention of hemorrhage) 03-01-2004, 04-04-2011   Colonoscopy   ED (erectile dysfunction)    Glaucoma    sees Dr. Arvil Chaco    Hyperglycemia    Hyperlipidemia    Hypertension    Internal hemorrhoids 03-15-1999   Flex Sig    Irritable bowel     Past Surgical History:  Procedure Laterality Date   COLONOSCOPY  04-04-11   per Dr. Jarold Motto, clear, no repeats needed   EYE SURGERY     bilateral cataract extraction per Dr Jettie Pagan   INGUINAL HERNIA REPAIR     KNEE ARTHROSCOPY     right knee   NASAL SINUS SURGERY       reports that he quit smoking about 52 years ago. He has never used smokeless tobacco. He reports current alcohol use. He reports that he does not use drugs.  Allergies  Allergen Reactions   Chocolate     Itching if he eats too much chocolate     Ciprofloxacin     Achilles tendon pain    Lipitor [Atorvastatin]     Muscle aches     Family History  Problem Relation Age of Onset   Hypertension Mother    Lung cancer Father    Colitis Daughter     Prior to Admission medications   Medication Sig Start Date End Date Taking? Authorizing Provider  acetaminophen (TYLENOL) 500 MG tablet Take 1,000 mg by mouth 3 (three) times daily.   Yes [provider]  AZOPT 1 % ophthalmic suspension Place 1 drop into both eyes 3 (three) times daily. 12/18/19  Yes [provider]  Coenzyme Q10 (CO Q 10 PO) Take 30 mg by mouth daily.   Yes [provider]  divalproex (DEPAKOTE ER) 500 MG 24 hr tablet Take 2 tablets (1,000 mg total) by mouth daily. Take 1 tablet every night Patient taking differently: Take 1,000 mg by mouth at bedtime. 05/12/21  Yes Adhikari, Willia Craze, MD  donepezil (ARICEPT) 10 MG tablet Take 10 mg by mouth at bedtime. 03/06/21  Yes [provider]  fish oil-omega-3 fatty acids 1000 MG capsule Take 1 g by mouth daily.   Yes [provider]  Glucosamine-Chondroitin 500-400 MG CAPS Take 1 tablet by mouth daily.   Yes [provider]  latanoprost (XALATAN) 0.005 %  ophthalmic solution Place 1 drop into both eyes at bedtime.   Yes [provider]  levETIRAcetam (KEPPRA) 500 MG tablet TAKE 1 TABLET (500 MG TOTAL) BY MOUTH TWO TIMES DAILY. Patient taking differently: Take 500 mg by mouth 2 (two) times daily. 03/11/21 03/11/22 Yes [name], MD  melatonin 5 MG TABS Take 10 mg by mouth at bedtime.   Yes [provider]  memantine (NAMENDA) 10 MG tablet Take 1 tablet twice a day Patient taking differently: Take 10 mg by mouth 2 (two) times daily. 03/22/21  Yes Van Clines, MD  midodrine (PROAMATINE) 5 MG tablet Take 1 tablet (5 mg total) by mouth 3 (three) times daily with meals. 05/12/21  Yes [name], MD  midodrine (PROAMATINE) 5 MG tablet Take 5 mg by mouth daily  as needed (SBP<100 or DBP>90).   Yes [provider]  MILK THISTLE PO Take 140 mg by mouth daily.   Yes [provider]  Multiple Vitamins-Minerals (MULTIVITAMIN WITH MINERALS) tablet Take 1 tablet by mouth daily.   Yes [provider]  NON FORMULARY 250 mLs See admin instructions. Give extra 250 ml of fluid every shift for hypenatremia   Yes [provider]  polyethylene glycol (MIRALAX / GLYCOLAX) 17 g packet Take 17 g by mouth daily.   Yes [provider]  RHOPRESSA 0.02 % SOLN Place 1 drop into both eyes at bedtime. 08/17/19  Yes [provider]  risperiDONE (RISPERDAL) 0.5 MG tablet Take 1 tablet (0.5 mg total) by mouth at bedtime. 02/25/20  Yes [name], MD  sennosides-docusate sodium (SENOKOT-S) 8.6-50 MG tablet Take 2 tablets by mouth at bedtime.   Yes [provider]  tamsulosin (FLOMAX) 0.4 MG CAPS capsule Take 0.4 mg by mouth every other day.   Yes [provider]    Physical Exam: Constitutional: Moderately built and nourished. Vitals:   10/21/21 0100 10/21/21 0130 10/21/21 0200 10/21/21 0230  BP: 135/78 133/71 139/75 115/71  Pulse: 97 97 96 95  Resp: (!) 25 20 (!) 22 (!) 25  Temp:      TempSrc:      SpO2: 99% 98% 99% 96%   Eyes: Anicteric no pallor. ENMT: No discharge from the ears eyes nose and mouth. Neck: No mass felt.  No neck rigidity. Respiratory: Mild expiratory wheeze no crepitations. Cardiovascular: S1-S2 heard. Abdomen: Soft nontender bowel sound present. Musculoskeletal: No edema. Skin: No rash. Neurologic: Patient is responding to calling his name but otherwise appears to be sleeping does not follow commands.  Pupils are reacting to light. Psychiatric: Has dementia.   Labs on Admission: I have personally reviewed following labs and imaging studies  CBC: Recent Labs  Lab 10/20/21 2029  WBC 4.7  NEUTROABS 3.5  HGB 11.1*  HCT 35.7*  MCV 87.7  PLT 217   Basic Metabolic  Panel: Recent Labs  Lab 10/20/21 2029  NA 141  K 3.9  CL 107  CO2 27  GLUCOSE 125*  BUN 27*  CREATININE 1.09  CALCIUM 8.1*   GFR: CrCl cannot be calculated (Unknown ideal weight.). Liver Function Tests: Recent Labs  Lab 10/20/21 2029  AST 26  ALT 28  ALKPHOS 64  BILITOT 0.6  PROT 5.2*  ALBUMIN 2.0*   No results for input(s): LIPASE, AMYLASE in the last 168 hours. No results for input(s): AMMONIA in the last 168 hours. Coagulation Profile: Recent Labs  Lab 10/20/21 2029  INR 1.3*   Cardiac Enzymes: No results for  input(s): CKTOTAL, CKMB, CKMBINDEX, TROPONINI in the last 168 hours. BNP (last 3 results) No results for input(s): PROBNP in the last 8760 hours. HbA1C: No results for input(s): HGBA1C in the last 72 hours. CBG: No results for input(s): GLUCAP in the last 168 hours. Lipid Profile: No results for input(s): CHOL, HDL, LDLCALC, TRIG, CHOLHDL, LDLDIRECT in the last 72 hours. Thyroid Function Tests: No results for input(s): TSH, T4TOTAL, FREET4, T3FREE, THYROIDAB in the last 72 hours. Anemia Panel: No results for input(s): VITAMINB12, FOLATE, FERRITIN, TIBC, IRON, RETICCTPCT in the last 72 hours. Urine analysis:    Component Value Date/Time   COLORURINE AMBER (A) 10/20/2021 2029   APPEARANCEUR CLEAR 10/20/2021 2029   LABSPEC 1.033 (H) 10/20/2021 2029   PHURINE 5.0 10/20/2021 2029   GLUCOSEU NEGATIVE 10/20/2021 2029   GLUCOSEU NEGATIVE 02/06/2020 1032   HGBUR NEGATIVE 10/20/2021 2029   HGBUR negative 09/16/2010 0839   BILIRUBINUR NEGATIVE 10/20/2021 2029   BILIRUBINUR neg 02/22/2019 0958   KETONESUR 20 (A) 10/20/2021 2029   PROTEINUR NEGATIVE 10/20/2021 2029   UROBILINOGEN 0.2 02/06/2020 1032   NITRITE NEGATIVE 10/20/2021 2029   LEUKOCYTESUR NEGATIVE 10/20/2021 2029   Sepsis Labs: @LABRCNTIP (procalcitonin:4,lacticidven:4) ) Recent Results (from the past 240 hour(s))  Resp Panel by RT-PCR (Flu A&B, Covid) Nasopharyngeal Swab     Status: Abnormal    Collection Time: 10/20/21  8:31 PM   Specimen: Nasopharyngeal Swab; Nasopharyngeal(NP) swabs in vial transport medium  Result Value Ref Range Status   SARS Coronavirus 2 by RT PCR POSITIVE (A) NEGATIVE Final    Comment: RESULT CALLED TO, READ BACK BY AND VERIFIED WITH: S NOLTE,RN@[id number]  10/20/21 MK (NOTE) SARS-CoV-2 target nucleic acids are DETECTED.  The SARS-CoV-2 RNA is generally detectable in upper respiratory specimens during the acute phase of infection. Positive results are indicative of the presence of the identified virus, but do not rule out bacterial infection or co-infection with other pathogens not detected by the test. Clinical correlation with patient history and other diagnostic information is necessary to determine patient infection status. The expected result is Negative.  Fact Sheet for Patients: 10/22/21  Fact Sheet for Healthcare Providers: BloggerCourse.com  This test is not yet approved or cleared by the [employer]iousBroker.[place] FDA and  has been authorized for detection and/or diagnosis of SARS-CoV-2 by FDA under an Emergency Use Authorization (EUA).  This EUA will remain in effect (meaning this test can be used)  for the duration of  the COVID-19 declaration under Section 564(b)(1) of the Act, 21 U.S.C. section 360bbb-3(b)(1), unless the authorization is terminated or revoked sooner.     Influenza A by PCR NEGATIVE NEGATIVE Final   Influenza B by PCR NEGATIVE NEGATIVE Final    Comment: (NOTE) The Xpert Xpress SARS-CoV-2/FLU/RSV plus assay is intended as an aid in the diagnosis of influenza from Nasopharyngeal swab specimens and should not be used as a sole basis for treatment. Nasal washings and aspirates are unacceptable for Xpert Xpress SARS-CoV-2/FLU/RSV testing.  Fact Sheet for Patients: [place]  Fact Sheet for Healthcare  Providers: BloggerCourse.com  This test is not yet approved or cleared by the Serious[employer]ker.it FDA and has been authorized for detection and/or diagnosis of SARS-CoV-2 by FDA under an Emergency Use Authorization (EUA). This EUA will remain in effect (meaning this test can be used) for the duration of the COVID-19 declaration under Section 564(b)(1) of the Act, 21 U.S.C. section 360bbb-3(b)(1), unless the authorization is terminated or revoked.  Performed at [facility], [address]  [address] [place], [place] [address]      Radiological Exams on Admission: DG Chest Port 1 View  Result Date: 10/20/2021 CLINICAL DATA:  Questionable sepsis. EXAM: PORTABLE CHEST 1 VIEW COMPARISON:  Chest x-ray 05/18/2021. FINDINGS: There is patchy multifocal airspace disease throughout the right mid and lower lung. There is airspace consolidation in the left lower lung with small left pleural effusion. There is no evidence for pneumothorax. The heart is enlarged, unchanged. No acute fractures are seen. IMPRESSION: 1. Bilateral lower lung airspace disease, left greater than right, concerning for infection. 2. Small left pleural effusion. 3. Stable cardiomegaly. Electronically Signed   By: Darliss Cheney M.D.   On: 10/20/2021 20:51    EKG: Independently reviewed.  Normal sinus rhythm with RBBB.  Assessment/Plan Principal Problem:   Pneumonia Active Problems:   Dyslipidemia   Essential hypertension   Dementia (HCC)   Seizure (HCC)   COVID    Pneumonia likely aspiration for which patient is on Zosyn.  We will keep patient n.p.o. get swallow evaluation. COVID-19 infection positive.  I think patient's symptoms are more likely from aspiration at this time.  We will keep patient on remdesivir.  No other patient also was COVID-positive when patient was admitted in May 2022.  We will check inflammatory markers closely follow respiratory status and at that time decide if patient needs to be  on steroids. History of seizures on Depakote and Keppra.  Since patient is n.p.o. I did discuss with on-call neurologist Dr. Amada Jupiter plan is to increase Keppra dose for now from 500 twice daily to 750 twice daily until we can give oral Depakote back again.  IV Depakote is in backorder. History of dementia presently NPO. Anemia -has had anemia previously.  Follow CBC.  Check anemia panel with next blood draw. Prior history of hypertension presently not on any high antihypertensives.  Closely follow blood pressure trends.  Since patient has aspiration pneumonia and also COVID infection will need close monitoring and inpatient status.   We need to get further history but able to contact family.   DVT prophylaxis: Lovenox. Code Status: Full code. Family Communication: Will need to discuss with family. Disposition Plan: Back to facility when stable. Consults called: Discussed with neurologist. Admission status: Inpatient.   Eduard Clos MD [facility] Pager [phone].  If 7PM-7AM, please contact night-coverage [email or web address] Password [facility]  10/21/2021, 2:37 AM

## 2021-10-22 ENCOUNTER — Inpatient Hospital Stay (HOSPITAL_COMMUNITY): Payer: Medicare Other

## 2021-10-22 DIAGNOSIS — J189 Pneumonia, unspecified organism: Secondary | ICD-10-CM | POA: Diagnosis not present

## 2021-10-22 DIAGNOSIS — F01518 Vascular dementia, unspecified severity, with other behavioral disturbance: Secondary | ICD-10-CM | POA: Diagnosis not present

## 2021-10-22 DIAGNOSIS — U071 COVID-19: Secondary | ICD-10-CM | POA: Diagnosis not present

## 2021-10-22 LAB — CBC WITH DIFFERENTIAL/PLATELET
Abs Immature Granulocytes: 0.13 10*3/uL — ABNORMAL HIGH (ref 0.00–0.07)
Basophils Absolute: 0 10*3/uL (ref 0.0–0.1)
Basophils Relative: 1 %
Eosinophils Absolute: 0 10*3/uL (ref 0.0–0.5)
Eosinophils Relative: 1 %
HCT: 40.7 % (ref 39.0–52.0)
Hemoglobin: 12.6 g/dL — ABNORMAL LOW (ref 13.0–17.0)
Immature Granulocytes: 3 %
Lymphocytes Relative: 11 %
Lymphs Abs: 0.6 10*3/uL — ABNORMAL LOW (ref 0.7–4.0)
MCH: 27.2 pg (ref 26.0–34.0)
MCHC: 31 g/dL (ref 30.0–36.0)
MCV: 87.9 fL (ref 80.0–100.0)
Monocytes Absolute: 1.2 10*3/uL — ABNORMAL HIGH (ref 0.1–1.0)
Monocytes Relative: 22 %
Neutro Abs: 3.2 10*3/uL (ref 1.7–7.7)
Neutrophils Relative %: 62 %
Platelets: 227 10*3/uL (ref 150–400)
RBC: 4.63 MIL/uL (ref 4.22–5.81)
RDW: 13.9 % (ref 11.5–15.5)
WBC: 5.2 10*3/uL (ref 4.0–10.5)
nRBC: 0 % (ref 0.0–0.2)

## 2021-10-22 LAB — GLUCOSE, CAPILLARY
Glucose-Capillary: 120 mg/dL — ABNORMAL HIGH (ref 70–99)
Glucose-Capillary: 146 mg/dL — ABNORMAL HIGH (ref 70–99)
Glucose-Capillary: 76 mg/dL (ref 70–99)
Glucose-Capillary: 80 mg/dL (ref 70–99)
Glucose-Capillary: 80 mg/dL (ref 70–99)
Glucose-Capillary: 81 mg/dL (ref 70–99)
Glucose-Capillary: 87 mg/dL (ref 70–99)

## 2021-10-22 LAB — COMPREHENSIVE METABOLIC PANEL
ALT: 33 U/L (ref 0–44)
AST: 48 U/L — ABNORMAL HIGH (ref 15–41)
Albumin: 2.2 g/dL — ABNORMAL LOW (ref 3.5–5.0)
Alkaline Phosphatase: 84 U/L (ref 38–126)
Anion gap: 8 (ref 5–15)
BUN: 24 mg/dL — ABNORMAL HIGH (ref 8–23)
CO2: 31 mmol/L (ref 22–32)
Calcium: 8.8 mg/dL — ABNORMAL LOW (ref 8.9–10.3)
Chloride: 104 mmol/L (ref 98–111)
Creatinine, Ser: 1.1 mg/dL (ref 0.61–1.24)
GFR, Estimated: >60 mL/min (ref >60)
Glucose, Bld: 94 mg/dL (ref 70–99)
Potassium: 4 mmol/L (ref 3.5–5.1)
Sodium: 143 mmol/L (ref 135–145)
Total Bilirubin: 0.6 mg/dL (ref 0.3–1.2)
Total Protein: 6 g/dL — ABNORMAL LOW (ref 6.5–8.1)

## 2021-10-22 LAB — D-DIMER, QUANTITATIVE: D-Dimer, Quant: 2.07 ug/mL-FEU — ABNORMAL HIGH (ref 0.00–0.50)

## 2021-10-22 LAB — C-REACTIVE PROTEIN: CRP: 37.9 mg/dL — ABNORMAL HIGH (ref <1.0)

## 2021-10-22 MED ORDER — DEXTROSE IN LACTATED RINGERS 5 % IV SOLN
INTRAVENOUS | Status: DC
  Administered 2021-10-23: 1000 mL via INTRAVENOUS

## 2021-10-22 NOTE — Plan of Care (Signed)
  Problem: Pain Managment: Goal: General experience of comfort will improve Outcome: Progressing   Problem: Safety: Goal: Ability to remain free from injury will improve Outcome: Progressing   Problem: Respiratory: Goal: Ability to maintain adequate ventilation will improve Outcome: Progressing   Problem: Education: Goal: Knowledge of risk factors and measures for prevention of condition will improve Outcome: Progressing   Problem: Coping: Goal: Psychosocial and spiritual needs will be supported Outcome: Progressing   Problem: Respiratory: Goal: Will maintain a patent airway Outcome: Progressing

## 2021-10-22 NOTE — Evaluation (Signed)
Clinical/Bedside Swallow Evaluation Patient Details  Name: [name] [name] MRN: [id number] Date of Birth: 1935/12/07  Today's Date: 10/22/2021 Time: SLP Start Time (ACUTE ONLY): 0850 SLP Stop Time (ACUTE ONLY): 0918 SLP Time Calculation (min) (ACUTE ONLY): 28 min  Past Medical History:  Past Medical History:  Diagnosis Date   Allergy    Dementia [facility])    sees Dr. Patrcia Dolly    Diverticulosis of colon (without mention of hemorrhage) 03-01-2004, 04-04-2011   Colonoscopy   ED (erectile dysfunction)    Glaucoma    sees Dr. Arvil Chaco    Hyperglycemia    Hyperlipidemia    Hypertension    Internal hemorrhoids 03-15-1999   Flex Sig    Irritable bowel    Past Surgical History:  Past Surgical History:  Procedure Laterality Date   COLONOSCOPY  04-04-11   per Dr. Jarold Motto, clear, no repeats needed   EYE SURGERY     bilateral cataract extraction per Dr Jettie Pagan   INGUINAL HERNIA REPAIR     KNEE ARTHROSCOPY     right knee   NASAL SINUS SURGERY     HPI:  Pt is an 86 y/o male admitted 10/26 from nursing home with respiratory distress/concern for aspiration. CXR 10/26: Bilateral lower lung airspace disease, left greater than right, concerning for infection. PMH: dementia, seizures, BPH, GI bleed, HTN, HLD, hyperglycemia.    Assessment / Plan / Recommendation  Clinical Impression  Pt sleeping upon SLP arrival for swallow evaluation this am, however he was reponsive to commands for completion of oral care and responded verbally to basic biographical questions consistently, though with eyes closed. Due to decreased mentation, oral motor function difficult to assess, however lingual and labial ROM/symmetry appeared [facility]. Ice chips x4 resulted in consistent, immediate and productive cough. Suspect delay in swallow initation with sips of thin liquids via straw as indicated by wet vocal quality and delayed cough. Initial bite of puree resulted in brief overt coughing with subsequent bites overall  tolerated, except for some mildly wet vocal quality, concerning for decreased airway protection. Pt's alertness appeared consistent throughout session, despite eyes closed, however question level of attention to PO intake impacting swallow function. Given clinical presentation this date and concern for aspiration PNA, per CXR, recommend NPO with ice chips after oral care pending completion of instrumental assessment. Modified Barium Swallow Study scheduled tentatively for this afternoon. Further recommendations to follow. Discusssed with RN.  SLP Visit Diagnosis: Dysphagia, unspecified (R13.10)    Aspiration Risk  Moderate aspiration risk (mod-severe)    Diet Recommendation NPO;Ice chips PRN after oral care   Medication Administration: Via alternative means    Other  Recommendations Oral Care Recommendations: Oral care QID;Staff/trained caregiver to provide oral care    Recommendations for follow up therapy are one component of a multi-disciplinary discharge planning process, led by the attending physician.  Recommendations may be updated based on patient status, additional functional criteria and insurance authorization.  Follow up Recommendations Other (comment) (TBD)      Frequency and Duration min 2x/week  2 weeks       Prognosis Prognosis for Safe Diet Advancement: Fair Barriers to Reach Goals: Cognitive deficits      Swallow Study   General Date of Onset: 10/20/21 HPI: Pt is an 86 y/o male admitted 10/26 from nursing home with respiratory distress/concern for aspiration. CXR 10/26: Bilateral lower lung airspace disease, left greater than right, concerning for infection. PMH: dementia, seizures, BPH, GI bleed, HTN, HLD, hyperglycemia. Type of Study:  Bedside Swallow Evaluation Previous Swallow Assessment: none per EMR Diet Prior to this Study: NPO Temperature Spikes Noted: Yes (99.4) Respiratory Status: Room air History of Recent Intubation: No Behavior/Cognition:  Cooperative;Pleasant mood;Confused;Requires cueing;Lethargic/Drowsy Oral Cavity Assessment: Within Functional Limits Oral Care Completed by SLP: Yes Oral Cavity - Dentition: Adequate natural dentition Vision:  (difficult to assess) Self-Feeding Abilities: Total assist Patient Positioning: Upright in bed;Postural control interferes with function Baseline Vocal Quality: Normal Volitional Cough: Strong Volitional Swallow: Unable to elicit    Oral/Motor/Sensory Function Overall Oral Motor/Sensory Function: Generalized oral weakness Lingual ROM: Within Functional Limits Lingual Symmetry: Within Functional Limits   Ice Chips Ice chips: Impaired Presentation: Spoon Pharyngeal Phase Impairments: Suspected delayed Swallow;Cough - Immediate;Wet Vocal Quality   Thin Liquid Thin Liquid: Impaired Presentation: Straw Pharyngeal  Phase Impairments: Throat Clearing - Delayed;Cough - Delayed;Wet Vocal Quality;Suspected delayed Swallow    Nectar Thick Nectar Thick Liquid: Not tested   Honey Thick Honey Thick Liquid: Not tested   Puree Puree: Impaired Presentation: Spoon Pharyngeal Phase Impairments: Wet Vocal Quality;Cough - Immediate   Solid     Solid: Not tested     Avie Echevaria, MA, CCC-SLP Acute Rehabilitation Services Office Number: [phone]  Paulette Blanch 10/22/2021,10:01 AM

## 2021-10-22 NOTE — Progress Notes (Signed)
Modified Barium Swallow Progress Note  Patient Details  Name: [name] [name] MRN: [id number] Date of Birth: 1935/07/20  Today's Date: 10/22/2021  Modified Barium Swallow completed.  Full report located under Chart Review in the Imaging Section.  Brief recommendations include the following:  Clinical Impression  Pt presents with oropharyngeal dysphagia with significant impacts related to reduced mentation and deconditioning. Pt lethargic and with reduced attention during testing this date and following of commands was intermittent, requiring consistent cueing from clinician. Swallow function c/b premature spillage of liquids, reduced pharyngeal peristalsis and UES opening resulting in residuals of all consistencies in the vallecular space and pyriform sinuses. Aspiration of thin and NTL noted (each x1) after the swallow with pyriform residuals falling into the airway, passing below the cords and pt demonstrating immediate cough response (PAS 7). Pharyngeal residuals increased with thicker consistencies with posterior face of epiglottis coated in significant residue post HTL trial. As testing progressed and pt's positioning reduced, view of laryngeal vestibule was reduced and clinician unable to determine any further aspiration events, however pt's risk is high.  Recommend NPO with ice chips after thorough oral care at this time. Short term alternative means of nutrition may need to be considered given pt's mentation and deconditioning. SLP to f/u for PO readiness.   Swallow Evaluation Recommendations       SLP Diet Recommendations: NPO;Ice chips PRN after oral care   Liquid Administration via: Spoon   Medication Administration: Via alternative means   Supervision: Full supervision/cueing for compensatory strategies;Staff to assist with self feeding   Compensations: Minimize environmental distractions;Slow rate;Small sips/bites;Clear throat intermittently   Postural Changes: Seated  upright at 90 degrees;Remain semi-upright after after feeds/meals (Comment)   Oral Care Recommendations: Oral care QID;Staff/trained caregiver to provide oral care       Avie Echevaria, MA, CCC-SLP Acute Rehabilitation Services Office Number: [phone]  Paulette Blanch 10/22/2021,4:17 PM

## 2021-10-22 NOTE — Progress Notes (Signed)
PROGRESS NOTE    [name] [name]  TDV:[id number] DOB: 26-Mar-1935 DOA: 10/20/2021 PCP: Karna Dupes, MD    Brief Narrative:  86 year old with dementia, seizure disorder, BPH, GI bleeding brought to the emergency room with sudden onset of shortness of breath and rhonchi noted at nursing home.  He is a long-term nursing home resident.  Found to have bilateral lower lobe pneumonia as well as positive COVID-19 test.  He was tested positive for COVID in May of this year, he had negative test in between.  Treated with Zosyn as aspiration pneumonia as well as remdesivir.   Assessment & Plan:   Principal Problem:   Pneumonia Active Problems:   Dyslipidemia   Essential hypertension   Dementia (HCC)   Seizure (HCC)   COVID  Bilateral lower lobe pneumonia, suspect aspiration pneumonia. Antibiotics to treat bacterial pneumonia with IV Zosyn.  Continue. Chest physiotherapy, incentive spirometry, deep breathing exercises, sputum induction, mucolytic's and bronchodilators. Cultures negative so far. Supplemental oxygen to keep saturations more than 90%. Speech therapy consultation today.  Probably can eat modified diet.  COVID-19 infection: Likely causing debilitating condition and aggravating his bacterial pneumonia. Supplemental oxygen to keep saturations more than 90%.  Chest physiotherapy as above. Covid directed therapy with , steroids, not indicated remdesivir, day 2/3. Due to severity of symptoms, patient will need daily inflammatory markers, chest x-rays, liver function test to monitor and direct COVID-19 therapies. His CRP is very much elevated, he is clinically improving.  We will continue remdesivir.  History of seizure on Depakote and Keppra: Keppra IV on increasing dose.  Depakote IV unavailable due to back orders. Hopefully we can put him back on his seizure medications today.  History of dementia: Lives in long-term nursing home.  PT OT today.  Start mobilizing.   DVT  prophylaxis: enoxaparin (LOVENOX) injection 40 mg Start: 10/21/21 1000   Code Status: Full code Family Communication: Son on the phone Disposition Plan: Status is: Inpatient  Remains inpatient appropriate because: persistent lethargy, IV antibiotics.         Consultants:  None   Procedures:  None   Antimicrobials:  Anti-infectives (From admission, onward)    Start     Dose/Rate Route Frequency Ordered Stop   10/22/21 1000  remdesivir 100 mg in sodium chloride 0.9 % 100 mL IVPB       See Hyperspace for full Linked Orders Report.   100 mg 200 mL/hr over 30 Minutes Intravenous Daily 10/21/21 0238 10/26/21 0959   10/21/21 0800  piperacillin-tazobactam (ZOSYN) IVPB 3.375 g        3.375 g 12.5 mL/hr over 240 Minutes Intravenous Every 8 hours 10/21/21 0240     10/21/21 0300  remdesivir 200 mg in sodium chloride 0.9% 250 mL IVPB       See Hyperspace for full Linked Orders Report.   200 mg 580 mL/hr over 30 Minutes Intravenous Once 10/21/21 0238 10/21/21 0536   10/20/21 2200  piperacillin-tazobactam (ZOSYN) IVPB 3.375 g        3.375 g 100 mL/hr over 30 Minutes Intravenous  Once 10/20/21 2150 10/21/21 0303          Subjective: Patient seen and examined.  He still sleepy but little bit more interactive today.  Responds to his name and answers few basic questions.  Keeps eyes closed.  Afebrile overnight.  On room air.  Less secretions today.  Objective: Vitals:   10/21/21 1940 10/21/21 2148 10/22/21 0322 10/22/21 0753  BP: (!) 171/96 139/81 Marland Kitchen)  163/89 (!) 164/94  Pulse: 79 70 75 79  Resp: 20 18 19 18   Temp: 98.9 F (37.2 C) 98.2 F (36.8 C) 98.5 F (36.9 C)   TempSrc: Axillary Axillary Axillary Axillary  SpO2: 100% 100% 90% 100%  Weight:      Height:        Intake/Output Summary (Last 24 hours) at 10/22/2021 0949 Last data filed at 10/22/2021 0325 Gross per 24 hour  Intake 1480.7 ml  Output --  Net 1480.7 ml   Filed Weights   10/21/21 0230  Weight: 72.6 kg     Examination:  General exam: Calm and quiet.  Looks comfortable.  Frail and debilitated. Alert to stimulation.  Unable to elicit orientation questions. Respiratory system: Mostly clear.  Conducted upper airway sounds. Cardiovascular system: S1 & S2 heard, RRR.  Gastrointestinal system: Abdomen is nondistended, soft and nontender. No organomegaly or masses felt. Normal bowel sounds heard. Central nervous system: Sleepy.  Tired.  Lethargic. He has 2+ edema both legs, 1+ edema both arms.    Data Reviewed: I have personally reviewed following labs and imaging studies  CBC: Recent Labs  Lab 10/20/21 2029 10/21/21 0229 10/22/21 0253  WBC 4.7 5.6 5.2  NEUTROABS 3.5 3.7 3.2  HGB 11.1* 12.4* 12.6*  HCT 35.7* 38.9* 40.7  MCV 87.7 84.7 87.9  PLT 217 248 227   Basic Metabolic Panel: Recent Labs  Lab 10/20/21 2029 10/21/21 0229 10/22/21 0253  NA 141 141 143  K 3.9 4.0 4.0  CL 107 105 104  CO2 27 29 31   GLUCOSE 125* 107* 94  BUN 27* 24* 24*  CREATININE 1.09 1.11 1.10  CALCIUM 8.1* 8.9 8.8*   GFR: Estimated Creatinine Clearance: 51.3 mL/min (by C-G formula based on SCr of 1.1 mg/dL). Liver Function Tests: Recent Labs  Lab 10/20/21 2029 10/21/21 0229 10/22/21 0253  AST 26 26 48*  ALT 28 32 33  ALKPHOS 64 73 84  BILITOT 0.6 0.4 0.6  PROT 5.2* 5.9* 6.0*  ALBUMIN 2.0* 2.2* 2.2*   No results for input(s): LIPASE, AMYLASE in the last 168 hours. No results for input(s): AMMONIA in the last 168 hours. Coagulation Profile: Recent Labs  Lab 10/20/21 2029  INR 1.3*   Cardiac Enzymes: No results for input(s): CKTOTAL, CKMB, CKMBINDEX, TROPONINI in the last 168 hours. BNP (last 3 results) No results for input(s): PROBNP in the last 8760 hours. HbA1C: No results for input(s): HGBA1C in the last 72 hours. CBG: Recent Labs  Lab 10/21/21 0717 10/22/21 0007 10/22/21 0319 10/22/21 0628  GLUCAP 106* 81 80 76   Lipid Profile: No results for input(s): CHOL, HDL,  LDLCALC, TRIG, CHOLHDL, LDLDIRECT in the last 72 hours. Thyroid Function Tests: No results for input(s): TSH, T4TOTAL, FREET4, T3FREE, THYROIDAB in the last 72 hours. Anemia Panel: No results for input(s): VITAMINB12, FOLATE, FERRITIN, TIBC, IRON, RETICCTPCT in the last 72 hours. Sepsis Labs: Recent Labs  Lab 10/20/21 2029  LATICACIDVEN 1.4    Recent Results (from the past 240 hour(s))  Blood culture (routine single)     Status: None (Preliminary result)   Collection Time: 10/20/21  8:29 PM   Specimen: BLOOD  Result Value Ref Range Status   Specimen Description BLOOD SITE NOT SPECIFIED  Final   Special Requests   Final    BOTTLES DRAWN AEROBIC AND ANAEROBIC Blood Culture results may not be optimal due to an inadequate volume of blood received in culture bottles   Culture   Final  NO GROWTH < 12 HOURS Performed at [facility], [address], [place], [place] [address]    Report Status PENDING  Incomplete  Urine Culture     Status: Abnormal   Collection Time: 10/20/21  8:29 PM   Specimen: In/Out Cath Urine  Result Value Ref Range Status   Specimen Description IN/OUT CATH URINE  Final   Special Requests   Final    NONE Performed at [facility], [address] [place], [place] [address]    Culture MULTIPLE SPECIES PRESENT, SUGGEST RECOLLECTION (A)  Final   Report Status 10/21/2021 FINAL  Final  Resp Panel by RT-PCR (Flu A&B, Covid) Nasopharyngeal Swab     Status: Abnormal   Collection Time: 10/20/21  8:31 PM   Specimen: Nasopharyngeal Swab; Nasopharyngeal(NP) swabs in vial transport medium  Result Value Ref Range Status   SARS Coronavirus 2 by RT PCR POSITIVE (A) NEGATIVE Final    Comment: RESULT CALLED TO, READ BACK BY AND VERIFIED WITH: S NOLTE,RN@[id number]  10/20/21 MK (NOTE) SARS-CoV-2 target nucleic acids are DETECTED.  The SARS-CoV-2 RNA is generally detectable in upper respiratory specimens during the acute phase of infection. Positive results  are indicative of the presence of the identified virus, but do not rule out bacterial infection or co-infection with other pathogens not detected by the test. Clinical correlation with patient history and other diagnostic information is necessary to determine patient infection status. The expected result is Negative.  Fact Sheet for Patients: BloggerCourse.com  Fact Sheet for Healthcare Providers: SeriousBroker.it  This test is not yet approved or cleared by the [place] FDA and  has been authorized for detection and/or diagnosis of SARS-CoV-2 by FDA under an Emergency Use Authorization (EUA).  This EUA will remain in effect (meaning this test can be used)  for the duration of  the COVID-19 declaration under Section 564(b)(1) of the Act, 21 U.S.C. section 360bbb-3(b)(1), unless the authorization is terminated or revoked sooner.     Influenza A by PCR NEGATIVE NEGATIVE Final   Influenza B by PCR NEGATIVE NEGATIVE Final    Comment: (NOTE) The Xpert Xpress SARS-CoV-2/FLU/RSV plus assay is intended as an aid in the diagnosis of influenza from Nasopharyngeal swab specimens and should not be used as a sole basis for treatment. Nasal washings and aspirates are unacceptable for Xpert Xpress SARS-CoV-2/FLU/RSV testing.  Fact Sheet for Patients: BloggerCourse.com  Fact Sheet for Healthcare Providers: SeriousBroker.it  This test is not yet approved or cleared by the [place] FDA and has been authorized for detection and/or diagnosis of SARS-CoV-2 by FDA under an Emergency Use Authorization (EUA). This EUA will remain in effect (meaning this test can be used) for the duration of the COVID-19 declaration under Section 564(b)(1) of the Act, 21 U.S.C. section 360bbb-3(b)(1), unless the authorization is terminated or revoked.  Performed at [facility], [address],  [place], [place] [address]          Radiology Studies: DG Chest Port 1 View  Result Date: 10/20/2021 CLINICAL DATA:  Questionable sepsis. EXAM: PORTABLE CHEST 1 VIEW COMPARISON:  Chest x-ray 05/18/2021. FINDINGS: There is patchy multifocal airspace disease throughout the right mid and lower lung. There is airspace consolidation in the left lower lung with small left pleural effusion. There is no evidence for pneumothorax. The heart is enlarged, unchanged. No acute fractures are seen. IMPRESSION: 1. Bilateral lower lung airspace disease, left greater than right, concerning for infection. 2. Small left pleural effusion. 3.  Stable cardiomegaly. Electronically Signed   By: Darliss Cheney M.D.   On: 10/20/2021 20:51        Scheduled Meds:  brinzolamide  1 drop Both Eyes TID   chlorhexidine  15 mL Mouth Rinse BID   enoxaparin (LOVENOX) injection  40 mg Subcutaneous Q24H   latanoprost  1 drop Both Eyes QHS   mouth rinse  15 mL Mouth Rinse q12n4p   Continuous Infusions:  sodium chloride Stopped (10/21/21 1655)   dextrose 5% lactated ringers     levETIRAcetam 750 mg (10/21/21 2142)   piperacillin-tazobactam (ZOSYN)  IV 3.375 g (10/22/21 0751)   remdesivir 100 mg in NS 100 mL       LOS: 2 days    Time spent: 35 minutes    Dorcas Carrow, MD [facility] Pager [phone]

## 2021-10-22 NOTE — TOC Initial Note (Signed)
Transition of Care [facility]) - Initial/Assessment Note    Patient Details  Name: [name] [name] MRN: [id number] Date of Birth: Oct 22, 1935  Transition of Care [facility]) CM/SW Contact:    Beckie Busing, RN Phone [phone]  10/22/2021, 2:32 PM  Clinical Narrative:                 [place] [facility] consulted for patient with high risk for readmission. Patient currently on covid precautions. Answers provided per wife. Patient is from [place] where he has been a resident there since May of this year. Plan is for patient to return to [place] when medically ready for discharge.  Lacinda Axon is currently providing all patient needs. No other needs noted at this time. TOC will continue to follow for disp needs.   Expected Discharge Plan: Skilled Nursing Facility Barriers to Discharge: Continued Medical Work up   Patient Goals and CMS Choice Patient states their goals for this hospitalization and ongoing recovery are:: unable to discuss covid precautions CMS Medicare.gov Compare Post Acute Care list provided to::  (per wife will return to [place]) Choice offered to / list presented to : NA  Expected Discharge Plan and Services Expected Discharge Plan: Skilled Nursing Facility In-house Referral: NA Discharge Planning Services: CM Consult Post Acute Care Choice: NA Living arrangements for the past 2 months: Skilled Nursing Facility                 DME Arranged: N/A DME Agency: NA       HH Arranged: NA HH Agency: NA        Prior Living Arrangements/Services Living arrangements for the past 2 months: Skilled Nursing Facility Lives with:: Facility Resident Patient language and need for interpreter reviewed:: Yes        Need for Family Participation in Patient Care: Yes (Comment) Care giver support system in place?: Yes (comment) Current home services:  (n/a) Criminal Activity/Legal Involvement Pertinent to Current Situation/Hospitalization: No - Comment as needed  Activities of  Daily Living      Permission Sought/Granted   Permission granted to share information with : No              Emotional Assessment Appearance::  (unable to assess)   Affect (typically observed): Unable to Assess Orientation: :  (unable to assess)   Psych Involvement: No (comment)  Admission diagnosis:  Pneumonia [J18.9] Aspiration pneumonia of both lower lobes, unspecified aspiration pneumonia type (HCC) [J69.0] COVID [U07.1] Dementia, unspecified dementia severity, unspecified dementia type, unspecified whether behavioral, psychotic, or mood disturbance or anxiety (HCC) [F03.90] Patient Active Problem List   Diagnosis Date Noted   Pneumonia 10/20/2021   Pressure injury of skin 05/11/2021   Pericardial effusion, acute    COVID    Palliative care by specialist    Goals of care, counseling/discussion    General weakness    Syncope 05/07/2021   Seizure (HCC) 03/09/2021   Dementia (HCC) 02/25/2021   BPH with urinary obstruction 12/22/2020   Ankle edema, bilateral 07/13/2020   Hallucinations 02/09/2020   Abdominal pain, acute 08/21/2014   GI bleeding 05/26/2012   Renal mass 05/26/2012   Diverticulosis of colon with hemorrhage 05/26/2012   SHOULDER PAIN, BILATERAL 07/30/2010   HERPES SIMPLEX INFECTION 10/10/2007   TACHYCARDIA, PAROXYSMAL NOS 10/10/2007   Dyslipidemia 07/11/2007   Essential hypertension 07/11/2007   ALLERGIC RHINITIS 07/11/2007   PCP:  Karna Dupes, MD Pharmacy:   [id number] - [place], [place] - [address] [place] [address] Phone: [phone] Fax: [phone]     Social Determinants of Health (SDOH) Interventions    Readmission Risk Interventions Readmission Risk Prevention Plan 10/22/2021  Transportation Screening Complete  Medication Review [facility]) Referral to Pharmacy  PCP or Specialist appointment within 3-5 days of discharge Complete  HRI or Home Care Consult  Not Complete  HRI or Home Care Consult Pt Refusal Comments from SNF  SW Recovery Care/Counseling Consult Complete  Palliative Care Screening Not Applicable  Skilled Nursing Facility Complete  Some recent data might be hidden

## 2021-10-23 ENCOUNTER — Inpatient Hospital Stay (HOSPITAL_COMMUNITY): Payer: Medicare Other

## 2021-10-23 DIAGNOSIS — U071 COVID-19: Secondary | ICD-10-CM | POA: Diagnosis not present

## 2021-10-23 DIAGNOSIS — J189 Pneumonia, unspecified organism: Secondary | ICD-10-CM | POA: Diagnosis not present

## 2021-10-23 DIAGNOSIS — R569 Unspecified convulsions: Secondary | ICD-10-CM | POA: Diagnosis not present

## 2021-10-23 DIAGNOSIS — M7989 Other specified soft tissue disorders: Secondary | ICD-10-CM

## 2021-10-23 DIAGNOSIS — I10 Essential (primary) hypertension: Secondary | ICD-10-CM | POA: Diagnosis not present

## 2021-10-23 LAB — COMPREHENSIVE METABOLIC PANEL
ALT: 30 U/L (ref 0–44)
AST: 40 U/L (ref 15–41)
Albumin: 2.3 g/dL — ABNORMAL LOW (ref 3.5–5.0)
Alkaline Phosphatase: 72 U/L (ref 38–126)
Anion gap: 8 (ref 5–15)
BUN: 20 mg/dL (ref 8–23)
CO2: 30 mmol/L (ref 22–32)
Calcium: 9 mg/dL (ref 8.9–10.3)
Chloride: 107 mmol/L (ref 98–111)
Creatinine, Ser: 0.97 mg/dL (ref 0.61–1.24)
GFR, Estimated: >60 mL/min (ref >60)
Glucose, Bld: 121 mg/dL — ABNORMAL HIGH (ref 70–99)
Potassium: 4 mmol/L (ref 3.5–5.1)
Sodium: 145 mmol/L (ref 135–145)
Total Bilirubin: 0.8 mg/dL (ref 0.3–1.2)
Total Protein: 5.9 g/dL — ABNORMAL LOW (ref 6.5–8.1)

## 2021-10-23 LAB — CBC WITH DIFFERENTIAL/PLATELET
Abs Immature Granulocytes: 0.18 10*3/uL — ABNORMAL HIGH (ref 0.00–0.07)
Basophils Absolute: 0 10*3/uL (ref 0.0–0.1)
Basophils Relative: 1 %
Eosinophils Absolute: 0.1 10*3/uL (ref 0.0–0.5)
Eosinophils Relative: 2 %
HCT: 42 % (ref 39.0–52.0)
Hemoglobin: 13.2 g/dL (ref 13.0–17.0)
Immature Granulocytes: 3 %
Lymphocytes Relative: 13 %
Lymphs Abs: 0.7 10*3/uL (ref 0.7–4.0)
MCH: 27.4 pg (ref 26.0–34.0)
MCHC: 31.4 g/dL (ref 30.0–36.0)
MCV: 87.1 fL (ref 80.0–100.0)
Monocytes Absolute: 1.3 10*3/uL — ABNORMAL HIGH (ref 0.1–1.0)
Monocytes Relative: 23 %
Neutro Abs: 3.3 10*3/uL (ref 1.7–7.7)
Neutrophils Relative %: 58 %
Platelets: 195 10*3/uL (ref 150–400)
RBC: 4.82 MIL/uL (ref 4.22–5.81)
RDW: 13.7 % (ref 11.5–15.5)
WBC: 5.5 10*3/uL (ref 4.0–10.5)
nRBC: 0 % (ref 0.0–0.2)

## 2021-10-23 LAB — C-REACTIVE PROTEIN: CRP: 29.1 mg/dL — ABNORMAL HIGH (ref <1.0)

## 2021-10-23 LAB — GLUCOSE, CAPILLARY
Glucose-Capillary: 146 mg/dL — ABNORMAL HIGH (ref 70–99)
Glucose-Capillary: 104 mg/dL — ABNORMAL HIGH (ref 70–99)
Glucose-Capillary: 105 mg/dL — ABNORMAL HIGH (ref 70–99)
Glucose-Capillary: 121 mg/dL — ABNORMAL HIGH (ref 70–99)
Glucose-Capillary: 117 mg/dL — ABNORMAL HIGH (ref 70–99)
Glucose-Capillary: 107 mg/dL — ABNORMAL HIGH (ref 70–99)

## 2021-10-23 LAB — D-DIMER, QUANTITATIVE: D-Dimer, Quant: 2.09 ug/mL-FEU — ABNORMAL HIGH (ref 0.00–0.50)

## 2021-10-23 MED ORDER — OSMOLITE 1.2 CAL PO LIQD: 1000.0000 mL | ORAL | Status: DC

## 2021-10-23 MED ORDER — LEVETIRACETAM 500 MG PO TABS
500.0000 mg | ORAL_TABLET | Freq: Two times a day (BID) | ORAL | Status: DC
  Administered 2021-10-23 – 2021-11-05 (×27): 500 mg via ORAL
  Filled 2021-10-23 (×27): qty 1

## 2021-10-23 MED ORDER — MEMANTINE HCL 10 MG PO TABS
10.0000 mg | ORAL_TABLET | Freq: Two times a day (BID) | ORAL | Status: DC
  Administered 2021-10-23 – 2021-11-01 (×19): 10 mg
  Filled 2021-10-23 (×4): qty 1
  Filled 2021-10-23: qty 2
  Filled 2021-10-23 (×14): qty 1

## 2021-10-23 MED ORDER — VALPROIC ACID 250 MG/5ML PO SOLN
500.0000 mg | Freq: Two times a day (BID) | ORAL | Status: DC
  Administered 2021-10-23 – 2021-11-01 (×18): 500 mg
  Filled 2021-10-23 (×19): qty 10

## 2021-10-23 MED ORDER — JEVITY 1.2 CAL PO LIQD: 1000.0000 mL | ORAL | Status: DC

## 2021-10-23 MED ORDER — RISPERIDONE 0.5 MG PO TABS
0.5000 mg | ORAL_TABLET | Freq: Every day | ORAL | Status: DC
  Administered 2021-10-23 – 2021-11-01 (×9): 0.5 mg
  Filled 2021-10-23 (×12): qty 1

## 2021-10-23 MED ORDER — MELATONIN 5 MG PO TABS
10.0000 mg | ORAL_TABLET | Freq: Every evening | ORAL | Status: DC | PRN
  Administered 2021-10-30 – 2021-11-02 (×2): 10 mg via ORAL
  Filled 2021-10-23 (×2): qty 2

## 2021-10-23 MED ORDER — DIVALPROEX SODIUM ER 500 MG PO TB24: 1000.0000 mg | ORAL_TABLET | Freq: Every day | ORAL | Status: DC

## 2021-10-23 MED ORDER — JEVITY 1.2 CAL PO LIQD
1000.0000 mL | ORAL | Status: DC
  Administered 2021-10-23: 1000 mL
  Administered 2021-10-24: 60 mL
  Administered 2021-10-25 – 2021-10-29 (×5): 1000 mL
  Filled 2021-10-23 (×10): qty 1000

## 2021-10-23 MED ORDER — DONEPEZIL HCL 10 MG PO TABS
10.0000 mg | ORAL_TABLET | Freq: Every day | ORAL | Status: DC
  Administered 2021-10-23 – 2021-10-31 (×9): 10 mg
  Filled 2021-10-23 (×9): qty 1

## 2021-10-23 NOTE — Plan of Care (Signed)
  Problem: Skin Integrity: Goal: Risk for impaired skin integrity will decrease Outcome: Progressing   Problem: Activity: Goal: Ability to tolerate increased activity will improve Outcome: Progressing   Problem: Clinical Measurements: Goal: Ability to maintain a body temperature in the normal range will improve Outcome: Progressing   Problem: Respiratory: Goal: Ability to maintain adequate ventilation will improve Outcome: Progressing   Problem: Respiratory: Goal: Will maintain a patent airway Outcome: Progressing

## 2021-10-23 NOTE — Progress Notes (Signed)
VASCULAR LAB    Bilateral lower extremity venous duplex has been performed.  See CV proc for preliminary results.   Emeka Lindner, RVT 10/23/2021, 6:07 PM

## 2021-10-23 NOTE — Plan of Care (Signed)
  Problem: Health Behavior/Discharge Planning: Goal: Ability to manage health-related needs will improve Outcome: Progressing   Problem: Clinical Measurements: Goal: Ability to maintain clinical measurements within normal limits will improve Outcome: Progressing   

## 2021-10-23 NOTE — Progress Notes (Signed)
PROGRESS NOTE    [name] BOURGAULT  YEB:[id number] DOB: July 26, 1935 DOA: 10/20/2021 PCP: Karna Dupes, MD    Brief Narrative:  86 year old with dementia, seizure disorder, BPH, GI bleeding brought to the emergency room with sudden onset of shortness of breath and rhonchi noted at nursing home.  He is a long-term nursing home resident.  Found to have bilateral lower lobe pneumonia as well as positive COVID-19 test.  He was tested positive for COVID in May of this year, he had negative test in between.  Treated with Zosyn as aspiration pneumonia as well as remdesivir. Remains eating poor.   Assessment & Plan:   Principal Problem:   Pneumonia Active Problems:   Dyslipidemia   Essential hypertension   Dementia (HCC)   Seizure (HCC)   COVID  Bilateral lower lobe pneumonia, likely aspiration pneumonia. Antibiotics to treat bacterial pneumonia with IV Zosyn.  Continue. Chest physiotherapy, incentive spirometry, deep breathing exercises, sputum induction, mucolytic's and bronchodilators. Cultures negative so far. Supplemental oxygen to keep saturations more than 90%. On room air today. Failed speech swallow eval, some improvement of mentation. Keep NPO until repeat Swallow. Feeding tube today for temporary feeding.  COVID-19 infection: Likely causing debilitating condition and aggravating his bacterial pneumonia. Supplemental oxygen to keep saturations more than 90%.  Chest physiotherapy as above. Covid directed therapy with , steroids, not indicated remdesivir, day 3/5. Due to severity of symptoms, patient will need daily inflammatory markers, chest x-rays, liver function test to monitor and direct COVID-19 therapies.  History of seizure on Depakote and Keppra: Keppra IV on increasing dose.  Depakote IV unavailable due to back orders. Hopefully we can put him back on his seizure medications.  History of dementia: Lives in long-term nursing home.  PT OT today.  Start  mobilizing.   DVT prophylaxis: enoxaparin (LOVENOX) injection 40 mg Start: 10/21/21 1000   Code Status: Full code Family Communication: Son on the phone 10/28 Disposition Plan: Status is: Inpatient  Remains inpatient appropriate because: persistent lethargy, IV antibiotics.         Consultants:  None   Procedures:  None   Antimicrobials:  Anti-infectives (From admission, onward)    Start     Dose/Rate Route Frequency Ordered Stop   10/22/21 1000  remdesivir 100 mg in sodium chloride 0.9 % 100 mL IVPB       See Hyperspace for full Linked Orders Report.   100 mg 200 mL/hr over 30 Minutes Intravenous Daily 10/21/21 0238 10/26/21 0959   10/21/21 0800  piperacillin-tazobactam (ZOSYN) IVPB 3.375 g        3.375 g 12.5 mL/hr over 240 Minutes Intravenous Every 8 hours 10/21/21 0240     10/21/21 0300  remdesivir 200 mg in sodium chloride 0.9% 250 mL IVPB       See Hyperspace for full Linked Orders Report.   200 mg 580 mL/hr over 30 Minutes Intravenous Once 10/21/21 0238 10/21/21 0536   10/20/21 2200  piperacillin-tazobactam (ZOSYN) IVPB 3.375 g        3.375 g 100 mL/hr over 30 Minutes Intravenous  Once 10/20/21 2150 10/21/21 0303          Subjective: Patient seen and examined. Still sleepy with eyes closed but wakes up to voice and answers basic questions.  Afebrile overnight.  On room air.  Less secretions today.  Objective: Vitals:   10/22/21 1206 10/22/21 2005 10/23/21 0317 10/23/21 0822  BP: (!) 149/82 (!) 170/88 (!) 172/96 (!) 174/88  Pulse: 81 66  63 70  Resp: 16 18 18 17   Temp: 98.2 F (36.8 C) 97.8 F (36.6 C) 97.8 F (36.6 C) 98.1 F (36.7 C)  TempSrc: Oral Oral Axillary Oral  SpO2: 100% 99% 95% 94%  Weight:      Height:        Intake/Output Summary (Last 24 hours) at 10/23/2021 1033 Last data filed at 10/23/2021 0535 Gross per 24 hour  Intake 2013.45 ml  Output 1250 ml  Net 763.45 ml   Filed Weights   10/21/21 0230  Weight: 72.6 kg     Examination:  General exam: Calm and quiet.  Looks comfortable.  Frail and debilitated. Alert to stimulation.  Unable to elicit orientation questions. Respiratory system: Mostly clear.  No added sounds. Cardiovascular system: S1 & S2 heard, RRR.  Gastrointestinal system: Abdomen is nondistended, soft and nontender. No organomegaly or masses felt. Normal bowel sounds heard. Central nervous system: Sleepy.  Tired.  Lethargic. He has 1+ edema all extremities, LE both tender on dorsum of feet.    Data Reviewed: I have personally reviewed following labs and imaging studies  CBC: Recent Labs  Lab 10/20/21 2029 10/21/21 0229 10/22/21 0253 10/23/21 0757  WBC 4.7 5.6 5.2 5.5  NEUTROABS 3.5 3.7 3.2 3.3  HGB 11.1* 12.4* 12.6* 13.2  HCT 35.7* 38.9* 40.7 42.0  MCV 87.7 84.7 87.9 87.1  PLT 217 248 227 195   Basic Metabolic Panel: Recent Labs  Lab 10/20/21 2029 10/21/21 0229 10/22/21 0253 10/23/21 0757  NA 141 141 143 145  K 3.9 4.0 4.0 4.0  CL 107 105 104 107  CO2 27 29 31 30   GLUCOSE 125* 107* 94 121*  BUN 27* 24* 24* 20  CREATININE 1.09 1.11 1.10 0.97  CALCIUM 8.1* 8.9 8.8* 9.0   GFR: Estimated Creatinine Clearance: 58.2 mL/min (by C-G formula based on SCr of 0.97 mg/dL). Liver Function Tests: Recent Labs  Lab 10/20/21 2029 10/21/21 0229 10/22/21 0253 10/23/21 0757  AST 26 26 48* 40  ALT 28 32 33 30  ALKPHOS 64 73 84 72  BILITOT 0.6 0.4 0.6 0.8  PROT 5.2* 5.9* 6.0* 5.9*  ALBUMIN 2.0* 2.2* 2.2* 2.3*   No results for input(s): LIPASE, AMYLASE in the last 168 hours. No results for input(s): AMMONIA in the last 168 hours. Coagulation Profile: Recent Labs  Lab 10/20/21 2029  INR 1.3*   Cardiac Enzymes: No results for input(s): CKTOTAL, CKMB, CKMBINDEX, TROPONINI in the last 168 hours. BNP (last 3 results) No results for input(s): PROBNP in the last 8760 hours. HbA1C: No results for input(s): HGBA1C in the last 72 hours. CBG: Recent Labs  Lab  10/22/21 1538 10/22/21 1957 10/22/21 2316 10/23/21 0315 10/23/21 0926  GLUCAP 87 120* 146* 146* 104*   Lipid Profile: No results for input(s): CHOL, HDL, LDLCALC, TRIG, CHOLHDL, LDLDIRECT in the last 72 hours. Thyroid Function Tests: No results for input(s): TSH, T4TOTAL, FREET4, T3FREE, THYROIDAB in the last 72 hours. Anemia Panel: No results for input(s): VITAMINB12, FOLATE, FERRITIN, TIBC, IRON, RETICCTPCT in the last 72 hours. Sepsis Labs: Recent Labs  Lab 10/20/21 2029  LATICACIDVEN 1.4    Recent Results (from the past 240 hour(s))  Blood culture (routine single)     Status: None (Preliminary result)   Collection Time: 10/20/21  8:29 PM   Specimen: BLOOD  Result Value Ref Range Status   Specimen Description BLOOD SITE NOT SPECIFIED  Final   Special Requests   Final    BOTTLES DRAWN AEROBIC  AND ANAEROBIC Blood Culture results may not be optimal due to an inadequate volume of blood received in culture bottles   Culture   Final    NO GROWTH 3 DAYS Performed at [facility], [address], [place], [place] [address]    Report Status PENDING  Incomplete  Urine Culture     Status: Abnormal   Collection Time: 10/20/21  8:29 PM   Specimen: In/Out Cath Urine  Result Value Ref Range Status   Specimen Description IN/OUT CATH URINE  Final   Special Requests   Final    NONE Performed at [facility], [address] [place], [place] [address]    Culture MULTIPLE SPECIES PRESENT, SUGGEST RECOLLECTION (A)  Final   Report Status 10/21/2021 FINAL  Final  Resp Panel by RT-PCR (Flu A&B, Covid) Nasopharyngeal Swab     Status: Abnormal   Collection Time: 10/20/21  8:31 PM   Specimen: Nasopharyngeal Swab; Nasopharyngeal(NP) swabs in vial transport medium  Result Value Ref Range Status   SARS Coronavirus 2 by RT PCR POSITIVE (A) NEGATIVE Final    Comment: RESULT CALLED TO, READ BACK BY AND VERIFIED WITH: S NOLTE,RN@[id number]  10/20/21 MK (NOTE) SARS-CoV-2 target nucleic  acids are DETECTED.  The SARS-CoV-2 RNA is generally detectable in upper respiratory specimens during the acute phase of infection. Positive results are indicative of the presence of the identified virus, but do not rule out bacterial infection or co-infection with other pathogens not detected by the test. Clinical correlation with patient history and other diagnostic information is necessary to determine patient infection status. The expected result is Negative.  Fact Sheet for Patients: BloggerCourse.com  Fact Sheet for Healthcare Providers: SeriousBroker.it  This test is not yet approved or cleared by the [place] FDA and  has been authorized for detection and/or diagnosis of SARS-CoV-2 by FDA under an Emergency Use Authorization (EUA).  This EUA will remain in effect (meaning this test can be used)  for the duration of  the COVID-19 declaration under Section 564(b)(1) of the Act, 21 U.S.C. section 360bbb-3(b)(1), unless the authorization is terminated or revoked sooner.     Influenza A by PCR NEGATIVE NEGATIVE Final   Influenza B by PCR NEGATIVE NEGATIVE Final    Comment: (NOTE) The Xpert Xpress SARS-CoV-2/FLU/RSV plus assay is intended as an aid in the diagnosis of influenza from Nasopharyngeal swab specimens and should not be used as a sole basis for treatment. Nasal washings and aspirates are unacceptable for Xpert Xpress SARS-CoV-2/FLU/RSV testing.  Fact Sheet for Patients: BloggerCourse.com  Fact Sheet for Healthcare Providers: SeriousBroker.it  This test is not yet approved or cleared by the [place] FDA and has been authorized for detection and/or diagnosis of SARS-CoV-2 by FDA under an Emergency Use Authorization (EUA). This EUA will remain in effect (meaning this test can be used) for the duration of the COVID-19 declaration under Section 564(b)(1) of  the Act, 21 U.S.C. section 360bbb-3(b)(1), unless the authorization is terminated or revoked.  Performed at [facility], [address], [place], [place] [address]          Radiology Studies: DG Swallowing Func-Speech Pathology  Result Date: 10/22/2021 Table formatting from the original result was not included. Objective Swallowing Evaluation: Type of Study: MBS-Modified Barium Swallow Study  Patient Details Name: CHANCEY RINGEL MRN: [id number] Date of Birth: [birth year] Today's Date: 10/22/2021 Time: SLP Start Time (ACUTE ONLY): 1410 -SLP Stop Time (ACUTE ONLY): 1428 SLP Time Calculation (min) (  ACUTE ONLY): 18 min Past Medical History: Past Medical History: Diagnosis Date  Allergy   Dementia [facility])   sees Dr. Patrcia Dolly   Diverticulosis of colon (without mention of hemorrhage) 03-01-2004, 04-04-2011  Colonoscopy  ED (erectile dysfunction)   Glaucoma   sees Dr. Arvil Chaco   Hyperglycemia   Hyperlipidemia   Hypertension   Internal hemorrhoids 03-15-1999  Flex Sig   Irritable bowel  Past Surgical History: Past Surgical History: Procedure Laterality Date  COLONOSCOPY  04-04-11  per Dr. Jarold Motto, clear, no repeats needed  EYE SURGERY    bilateral cataract extraction per Dr Jettie Pagan  INGUINAL HERNIA REPAIR    KNEE ARTHROSCOPY    right knee  NASAL SINUS SURGERY   HPI: Pt is an 86 y/o male admitted 10/26 from nursing home with respiratory distress/concern for aspiration. CXR 10/26: Bilateral lower lung airspace disease, left greater than right, concerning for infection. PMH: dementia, seizures, BPH, GI bleed, HTN, HLD, hyperglycemia.  No data recorded Assessment / Plan / Recommendation CHL IP CLINICAL IMPRESSIONS 10/22/2021 Clinical Impression Pt presents with oropharyngeal dysphagia with significant impacts related to reduced mentation and deconditioning. Pt lethargic and with reduced attention during testing this date and follwing of commands was intermittent, requiring consistent cueing from clinician.  Swallow function c/b premature spillage of liquids, reduced pharyngeal peristalsis and UES opening resulting in residuals of all consistencies in the vallecular space and pyriform sinuses. Aspiration of thin and NTL noted (each x1) after the swallow with pyriform residuals falling into the airway, passing below the cords and pt demonstrating immediate cough response (PAS 7). Pharyngeal residuals increased with thicker consistencies with posterior face of epiglottis coated in significant residue post HTL trial. As testing progressed and pt's positioning reduced, view of laryngeal vestibule was reduced and clinician unable to determine any further aspiration events, however pt's risk is high.  Recommend NPO with ice chips after thorough oral care at this time. Short term alternative means of nutrition may need to be considered given pt's mentation and deconditioning. SLP to f/u for PO readiness.  SLP Visit Diagnosis Dysphagia, oropharyngeal phase (R13.12) Attention and concentration deficit following -- Frontal lobe and executive function deficit following -- Impact on safety and function Severe aspiration risk   CHL IP TREATMENT RECOMMENDATION 10/22/2021 Treatment Recommendations Therapy as outlined in treatment plan below;F/U MBS in --- days (Comment)   Prognosis 10/22/2021 Prognosis for Safe Diet Advancement Fair Barriers to Reach Goals Severity of deficits;Cognitive deficits Barriers/Prognosis Comment -- CHL IP DIET RECOMMENDATION 10/22/2021 SLP Diet Recommendations NPO;Ice chips PRN after oral care Liquid Administration via Spoon Medication Administration Via alternative means Compensations Minimize environmental distractions;Slow rate;Small sips/bites;Clear throat intermittently Postural Changes Seated upright at 90 degrees;Remain semi-upright after after feeds/meals (Comment)   CHL IP OTHER RECOMMENDATIONS 10/22/2021 Recommended Consults -- Oral Care Recommendations Oral care QID;Staff/trained caregiver to  provide oral care Other Recommendations --   CHL IP FOLLOW UP RECOMMENDATIONS 10/22/2021 Follow up Recommendations Skilled Nursing facility   [facility] IP FREQUENCY AND DURATION 10/22/2021 Speech Therapy Frequency (ACUTE ONLY) min 2x/week Treatment Duration 2 weeks      CHL IP ORAL PHASE 10/22/2021 Oral Phase Impaired Oral - Pudding Teaspoon -- Oral - Pudding Cup -- Oral - Honey Teaspoon -- Oral - Honey Cup -- Oral - Nectar Teaspoon -- Oral - Nectar Cup -- Oral - Nectar Straw -- Oral - Thin Teaspoon -- Oral - Thin Cup Premature spillage Oral - Thin Straw Premature spillage Oral - Puree -- Oral - Mech Soft --  Oral - Regular -- Oral - Multi-Consistency -- Oral - Pill -- Oral Phase - Comment --  CHL IP PHARYNGEAL PHASE 10/22/2021 Pharyngeal Phase Impaired Pharyngeal- Pudding Teaspoon -- Pharyngeal -- Pharyngeal- Pudding Cup -- Pharyngeal -- Pharyngeal- Honey Teaspoon -- Pharyngeal -- Pharyngeal- Honey Cup Pharyngeal residue - valleculae;Pharyngeal residue - pyriform;Reduced pharyngeal peristalsis Pharyngeal -- Pharyngeal- Nectar Teaspoon -- Pharyngeal -- Pharyngeal- Nectar Cup Reduced pharyngeal peristalsis;Penetration/Apiration after swallow;Moderate aspiration;Pharyngeal residue - valleculae;Pharyngeal residue - pyriform Pharyngeal Material enters airway, passes BELOW cords and not ejected out despite cough attempt by patient Pharyngeal- Nectar Straw Reduced pharyngeal peristalsis;Penetration/Apiration after swallow;Moderate aspiration;Pharyngeal residue - valleculae;Pharyngeal residue - pyriform Pharyngeal Material enters airway, passes BELOW cords and not ejected out despite cough attempt by patient Pharyngeal- Thin Teaspoon -- Pharyngeal -- Pharyngeal- Thin Cup -- Pharyngeal -- Pharyngeal- Thin Straw Reduced pharyngeal peristalsis;Penetration/Apiration after swallow;Moderate aspiration;Pharyngeal residue - valleculae;Pharyngeal residue - pyriform Pharyngeal Material enters airway, passes BELOW cords and not ejected out  despite cough attempt by patient Pharyngeal- Puree Pharyngeal residue - valleculae;Pharyngeal residue - pyriform;Reduced pharyngeal peristalsis Pharyngeal -- Pharyngeal- Mechanical Soft -- Pharyngeal -- Pharyngeal- Regular NT Pharyngeal -- Pharyngeal- Multi-consistency -- Pharyngeal -- Pharyngeal- Pill NT Pharyngeal -- Pharyngeal Comment --  No flowsheet data found. Avie Echevaria, MA, CCC-SLP Acute Rehabilitation Services Office Number: [phone] Paulette Blanch 10/22/2021, 4:19 PM                   Scheduled Meds:  brinzolamide  1 drop Both Eyes TID   chlorhexidine  15 mL Mouth Rinse BID   enoxaparin (LOVENOX) injection  40 mg Subcutaneous Q24H   latanoprost  1 drop Both Eyes QHS   mouth rinse  15 mL Mouth Rinse q12n4p   Continuous Infusions:  sodium chloride Stopped (10/22/21 1658)   dextrose 5% lactated ringers 100 mL/hr at 10/22/21 2325   levETIRAcetam 750 mg (10/22/21 2127)   piperacillin-tazobactam (ZOSYN)  IV 3.375 g (10/23/21 0942)   remdesivir 100 mg in NS 100 mL 100 mg (10/23/21 0944)     LOS: 3 days    Time spent: 30 minutes    Dorcas Carrow, MD [facility] Pager [phone]

## 2021-10-23 NOTE — Progress Notes (Signed)
Initial Nutrition Assessment  DOCUMENTATION CODES:   Not applicable  INTERVENTION:  Initiate Jevity 1.2 cal formula @ 20 ml/hr via NGT and increase by 10 ml every 4 hours to goal rate of 60 ml/hr.   Tube feeding regimen provides 1728 kcal, 80 grams of protein, and 1166 ml of H2O.   NUTRITION DIAGNOSIS:   Inadequate oral intake related to inability to eat as evidenced by NPO status.  GOAL:   Patient will meet greater than or equal to 90% of their needs  MONITOR:   TF tolerance, Skin, Weight trends, Labs, I & O's  REASON FOR ASSESSMENT:   Consult Enteral/tube feeding initiation and management  ASSESSMENT:   86 year old with dementia, seizure disorder, BPH, GI bleeding presents with sudden onset of shortness of breath and rhonchi noted at nursing home. Found to have bilateral lower lobe pneumonia as well as positive COVID-19.  Pt failed swallow evaluation. NGT placed today. RD consulted for tube feeding initiation and management. RD to order tube feeding orders. Pt with no significant weight loss per weight records. Unable to complete Nutrition-Focused physical exam at this time.   Labs and medications reviewed.   Diet Order:   Diet Order     None       EDUCATION NEEDS:   Not appropriate for education at this time  Skin:  Skin Assessment: Reviewed RN Assessment  Last BM:  PTA  Height:   Ht Readings from Last 1 Encounters:  10/21/21 6\' 1"  (1.854 m)    Weight:   Wt Readings from Last 1 Encounters:  10/21/21 72.6 kg    Ideal Body Weight:  83.63 kg  BMI:  Body mass index is 21.11 kg/m.  Estimated Nutritional Needs:   Kcal:  1700-1850  Protein:  80-90 grams  Fluid:  >/= 1.7 L/day  10/23/21, MS, RD, LDN RD pager number/after hours weekend pager number on Amion.

## 2021-10-23 NOTE — Evaluation (Signed)
Physical Therapy Evaluation Patient Details Name: [name] [name] MRN: [id number] DOB: 22-Dec-1935  Today's Date: 10/23/2021  History of Present Illness  pt isan 86 y/o male admitted 10/26 in respiratory distress with rhonchi and coughing, concern for aspiration.  Pt was initially requiring 2L O2 to maintain sats.  Covid check on admission came back positive.  PMHx:  dementia, glaucoma, HTN,  Clinical Impression  Pt admitted with/for respiratory failure due to suspected asp. PNA.  COVID + on arrival.  Pt needing max assist overall for basic mobility..  Pt currently limited functionally due to the problems listed. ( See problems list.)   Pt will benefit from PT to maximize function and safety in order to get ready for next venue listed below.        Recommendations for follow up therapy are one component of a multi-disciplinary discharge planning process, led by the attending physician.  Recommendations may be updated based on patient status, additional functional criteria and insurance authorization.  Follow Up Recommendations Other (comment) (follow up rehab skilled, expect short term.  Not sure of recent PLOF.)    Assistance Recommended at Discharge Frequent or constant Supervision/Assistance  Functional Status Assessment Patient has had a recent decline in their functional status and/or demonstrates limited ability to make significant improvements in function in a reasonable and predictable amount of time  Equipment Recommendations  Other (comment) (TBA)    Recommendations for Other Services       Precautions / Restrictions Precautions Precautions: Fall      Mobility  Bed Mobility Overal bed mobility: Needs Assistance Bed Mobility: Supine to Sit;Sit to Supine     Supine to sit: Max assist Sit to supine: Max assist   General bed mobility comments: directional cues and assist at trunk and LE's for in/out of bed to EOB.  Pt needed assist to scoot to EOB as not attempting to  follow direction.    Transfers Overall transfer level: Needs assistance   Transfers: Sit to/from Stand Sit to Stand: Max assist           General transfer comment: face to face  sit to stand into sub maximal upright stance.    Ambulation/Gait                Stairs            Wheelchair Mobility    Modified Rankin (Stroke Patients Only)       Balance Overall balance assessment: Needs assistance Sitting-balance support: Single extremity supported;Bilateral upper extremity supported;Feet supported Sitting balance-Leahy Scale: Poor Sitting balance - Comments: listed either L or forward needing assist     Standing balance-Leahy Scale: Poor                               Pertinent Vitals/Pain Pain Assessment: CPOT Faces Pain Scale: Hurts little more Breathing: normal Negative Vocalization: occasional moan/groan, low speech, negative/disapproving quality Facial Expression: facial grimacing Body Language: tense, distressed pacing, fidgeting Consolability: distracted or reassured by voice/touch PAINAD Score: 5 Pain Location: knees with ROM Pain Descriptors / Indicators: Grimacing;Guarding;Moaning Pain Intervention(s): Monitored during session;Limited activity within patient's tolerance;Repositioned    Home Living Family/patient expects to be discharged to:: Skilled nursing facility                        Prior Function Prior Level of Function : Needs assist  Hand Dominance   Dominant Hand: Right    Extremity/Trunk Assessment   Upper Extremity Assessment Upper Extremity Assessment: Difficult to assess due to impaired cognition (moved  UE's bil and gripped lines when gloves off.)    Lower Extremity Assessment Lower Extremity Assessment: RLE deficits/detail;LLE deficits/detail RLE Deficits / Details: stiff painful through available range.  extension to -15* and flexion painful to 70*, attained 90* at  EOB,  difficult to MMT due to poor cognition. RLE Coordination: decreased fine motor LLE Deficits / Details: stiff painful through available range.  extension to -15* and flexion painful to 80*, attained 90* at EOB,  difficult to MMT due to poor cognition. LLE Coordination: decreased fine motor    Cervical / Trunk Assessment Cervical / Trunk Assessment: Kyphotic  Communication   Communication: Other (comment) (some vocalizations mixed with moaning)  Cognition Arousal/Alertness: Awake/alert;Lethargic Behavior During Therapy: Restless;Flat affect Overall Cognitive Status: History of cognitive impairments - at baseline                                          General Comments General comments (skin integrity, edema, etc.): Pt had no oxygen on when I arrived, sats during mobility between 88-90%  HR in the 80's    Exercises Other Exercises Other Exercises: completed basic UE and LE PROM   Assessment/Plan    PT Assessment Patient needs continued PT services  PT Problem List Decreased strength;Decreased activity tolerance;Decreased balance;Decreased mobility;Decreased cognition;Pain;Decreased safety awareness       PT Treatment Interventions DME instruction;Gait training;Functional mobility training;Therapeutic activities;Balance training;Patient/family education    PT Goals (Current goals can be found in the Care Plan section)  Acute Rehab PT Goals PT Goal Formulation: Patient unable to participate in goal setting Time For Goal Achievement: 11/06/21 Potential to Achieve Goals: Fair    Frequency Min 2X/week   Barriers to discharge        Co-evaluation               AM-PAC PT "6 Clicks" Mobility  Outcome Measure Help needed turning from your back to your side while in a flat bed without using bedrails?: A Lot Help needed moving from lying on your back to sitting on the side of a flat bed without using bedrails?: A Lot Help needed moving to and from a  bed to a chair (including a wheelchair)?: A Lot Help needed standing up from a chair using your arms (e.g., wheelchair or bedside chair)?: A Lot Help needed to walk in hospital room?: Total Help needed climbing 3-5 steps with a railing? : Total 6 Click Score: 10    End of Session   Activity Tolerance: Patient tolerated treatment well Patient left: in bed;with call bell/phone within reach;with bed alarm set (mitt on bil) Nurse Communication: Mobility status PT Visit Diagnosis: Other abnormalities of gait and mobility (R26.89);Muscle weakness (generalized) (M62.81);Pain Pain - part of body:  (general)    Time: 3202-3343 PT Time Calculation (min) (ACUTE ONLY): 24 min   Charges:   PT Evaluation $PT Eval Moderate Complexity: 1 Mod PT Treatments $Therapeutic Activity: 8-22 mins        10/23/2021  Jacinto Halim., PT Acute Rehabilitation Services [phone]  (pager) [phone]  (office)  Eliseo Gum Marijose Curington 10/23/2021, 3:09 PM

## 2021-10-24 DIAGNOSIS — I10 Essential (primary) hypertension: Secondary | ICD-10-CM | POA: Diagnosis not present

## 2021-10-24 DIAGNOSIS — R569 Unspecified convulsions: Secondary | ICD-10-CM | POA: Diagnosis not present

## 2021-10-24 DIAGNOSIS — J189 Pneumonia, unspecified organism: Secondary | ICD-10-CM | POA: Diagnosis not present

## 2021-10-24 LAB — COMPREHENSIVE METABOLIC PANEL
ALT: 25 U/L (ref 0–44)
AST: 30 U/L (ref 15–41)
Albumin: 2 g/dL — ABNORMAL LOW (ref 3.5–5.0)
Alkaline Phosphatase: 71 U/L (ref 38–126)
Anion gap: 5 (ref 5–15)
BUN: 17 mg/dL (ref 8–23)
CO2: 30 mmol/L (ref 22–32)
Calcium: 8.4 mg/dL — ABNORMAL LOW (ref 8.9–10.3)
Chloride: 110 mmol/L (ref 98–111)
Creatinine, Ser: 0.88 mg/dL (ref 0.61–1.24)
GFR, Estimated: >60 mL/min (ref >60)
Glucose, Bld: 119 mg/dL — ABNORMAL HIGH (ref 70–99)
Potassium: 3.5 mmol/L (ref 3.5–5.1)
Sodium: 145 mmol/L (ref 135–145)
Total Bilirubin: 0.8 mg/dL (ref 0.3–1.2)
Total Protein: 5.2 g/dL — ABNORMAL LOW (ref 6.5–8.1)

## 2021-10-24 LAB — CBC WITH DIFFERENTIAL/PLATELET
Abs Immature Granulocytes: 0.2 10*3/uL — ABNORMAL HIGH (ref 0.00–0.07)
Basophils Absolute: 0 10*3/uL (ref 0.0–0.1)
Basophils Relative: 1 %
Eosinophils Absolute: 0.2 10*3/uL (ref 0.0–0.5)
Eosinophils Relative: 3 %
HCT: 35.9 % — ABNORMAL LOW (ref 39.0–52.0)
Hemoglobin: 11.1 g/dL — ABNORMAL LOW (ref 13.0–17.0)
Immature Granulocytes: 4 %
Lymphocytes Relative: 12 %
Lymphs Abs: 0.6 10*3/uL — ABNORMAL LOW (ref 0.7–4.0)
MCH: 27 pg (ref 26.0–34.0)
MCHC: 30.9 g/dL (ref 30.0–36.0)
MCV: 87.3 fL (ref 80.0–100.0)
Monocytes Absolute: 1.2 10*3/uL — ABNORMAL HIGH (ref 0.1–1.0)
Monocytes Relative: 23 %
Neutro Abs: 2.9 10*3/uL (ref 1.7–7.7)
Neutrophils Relative %: 57 %
Platelets: 177 10*3/uL (ref 150–400)
RBC: 4.11 MIL/uL — ABNORMAL LOW (ref 4.22–5.81)
RDW: 13.6 % (ref 11.5–15.5)
WBC: 5.1 10*3/uL (ref 4.0–10.5)
nRBC: 0 % (ref 0.0–0.2)

## 2021-10-24 LAB — C-REACTIVE PROTEIN: CRP: 22 mg/dL — ABNORMAL HIGH (ref <1.0)

## 2021-10-24 LAB — D-DIMER, QUANTITATIVE: D-Dimer, Quant: 1.21 ug/mL-FEU — ABNORMAL HIGH (ref 0.00–0.50)

## 2021-10-24 LAB — GLUCOSE, CAPILLARY
Glucose-Capillary: 109 mg/dL — ABNORMAL HIGH (ref 70–99)
Glucose-Capillary: 111 mg/dL — ABNORMAL HIGH (ref 70–99)
Glucose-Capillary: 114 mg/dL — ABNORMAL HIGH (ref 70–99)
Glucose-Capillary: 120 mg/dL — ABNORMAL HIGH (ref 70–99)
Glucose-Capillary: 112 mg/dL — ABNORMAL HIGH (ref 70–99)
Glucose-Capillary: 123 mg/dL — ABNORMAL HIGH (ref 70–99)

## 2021-10-24 NOTE — Evaluation (Signed)
Occupational Therapy Evaluation Patient Details Name: [name] [name] MRN: [id number] DOB: 11/02/1935 Today's Date: 10/24/2021   History of Present Illness pt isan 86 y/o male admitted 10/26 in respiratory distress with rhonchi and coughing, concern for aspiration.  Pt was initially requiring 2L O2 to maintain sats.  Covid check on admission came back positive.  PMHx:  dementia, glaucoma, HTN,   Clinical Impression   Pt admitted for concerns listed above. PTA pt was residing at [facility] in their long term care facility. Unsure of pt's prior levels of functioning at this time, as pt is a poor historian. This session pt presents with difficulties follow commands or responding to questions. He requires max-total A +1-2 for all ADL's and transfers and expresses increased pain with all mobility. Pt quickly tensed up and limited his mobility, most likely due to pain. OT follow acutely to address problems listed below.      Recommendations for follow up therapy are one component of a multi-disciplinary discharge planning process, led by the attending physician.  Recommendations may be updated based on patient status, additional functional criteria and insurance authorization.   Follow Up Recommendations  Skilled nursing-short term rehab (<3 hours/day) (Pt returning to Long term care.)    Assistance Recommended at Discharge Frequent or constant Supervision/Assistance  Functional Status Assessment  Patient has had a recent decline in their functional status and/or demonstrates limited ability to make significant improvements in function in a reasonable and predictable amount of time  Equipment Recommendations  None recommended by OT    Recommendations for Other Services       Precautions / Restrictions Precautions Precautions: Fall Restrictions Weight Bearing Restrictions: No      Mobility Bed Mobility Overal bed mobility: Needs Assistance Bed Mobility: Supine to Sit;Sit to Supine      Supine to sit: Max assist;+2 for physical assistance;+2 for safety/equipment Sit to supine: Max assist;+2 for physical assistance;+2 for safety/equipment   General bed mobility comments: directional cues and assist at trunk and LE's for in/out of bed to EOB.  Pt needed assist to scoot to EOB as not attempting to follow direction.    Transfers                   General transfer comment: deferred due to pt pain levels      Balance Overall balance assessment: Needs assistance Sitting-balance support: Bilateral upper extremity supported;Feet supported Sitting balance-Leahy Scale: Poor Sitting balance - Comments: Leaning L and forward, requiring max support EOB Postural control: Left lateral lean                                 ADL either performed or assessed with clinical judgement   ADL Overall ADL's : Needs assistance/impaired                                       General ADL Comments: Unsure what pt's baseline is, at this time he is requiring max-total A +1-2 for all ADL's, transfers, and mobility.     Vision Baseline Vision/History: 1 Wears glasses Ability to See in Adequate Light: 1 Impaired Patient Visual Report: No change from baseline Vision Assessment?: No apparent visual deficits     Perception     Praxis      Pertinent Vitals/Pain Pain Assessment: Faces Faces Pain Scale: Hurts  whole lot Pain Location: Generalized with any mobility Pain Descriptors / Indicators: Grimacing;Guarding;Moaning Pain Intervention(s): Limited activity within patient's tolerance;Monitored during session;Repositioned     Hand Dominance     Extremity/Trunk Assessment Upper Extremity Assessment Upper Extremity Assessment: RUE deficits/detail;LUE deficits/detail RUE Deficits / Details: Pt very stiff, unable to PROM shoulder flex past 40* without pt yelling out and locking up. All other attemps to range, pt tensed up and could not range. RUE  Coordination: decreased fine motor;decreased gross motor LUE Deficits / Details: Pt very stiff, unable to PROM shoulder flex past 40* without pt yelling out and locking up. All other attemps to range, pt tensed up and could not range. LUE Coordination: decreased fine motor;decreased gross motor   Lower Extremity Assessment Lower Extremity Assessment: Defer to PT evaluation   Cervical / Trunk Assessment Cervical / Trunk Assessment: Kyphotic   Communication Communication Communication: Expressive difficulties   Cognition Arousal/Alertness: Awake/alert Behavior During Therapy: Flat affect Overall Cognitive Status: History of cognitive impairments - at baseline                                 General Comments: Pt made somecomments throughout session, however did not respond to questions.     General Comments  NG tube in place, O2 on 1L, VSS, coughing up sputum, during session, OT suctioning out.    Exercises     Shoulder Instructions      Home Living Family/patient expects to be discharged to:: Skilled nursing facility                                 Additional Comments: Pt is a long term resident at [facility]      Prior Functioning/Environment Prior Level of Function : Needs assist;Patient poor historian/Family not available             Mobility Comments: [facility] provides all needs ADLs Comments: Facility provides assistance with all needs        OT Problem List: Decreased strength;Decreased range of motion;Decreased activity tolerance;Impaired balance (sitting and/or standing);Decreased coordination;Decreased cognition;Decreased safety awareness;Decreased knowledge of use of DME or AE;Impaired UE functional use      OT Treatment/Interventions: Self-care/ADL training;Therapeutic exercise;Energy conservation;DME and/or AE instruction;Therapeutic activities;Patient/family education;Balance training    OT Goals(Current goals can be found in  the care plan section) Acute Rehab OT Goals Patient Stated Goal: none stated OT Goal Formulation: Patient unable to participate in goal setting Time For Goal Achievement: 11/07/21 Potential to Achieve Goals: Fair ADL Goals Pt Will Perform Eating: with min assist;with adaptive utensils;sitting Pt Will Perform Grooming: with min assist;with adaptive equipment;sitting Pt Will Perform Upper Body Bathing: with min assist;sitting Pt Will Perform Upper Body Dressing: with min assist;sitting Additional ADL Goal #1: Pt will maintain balance EOB for 3 mins to prepare for seated ADL's.  OT Frequency: Min 1X/week   Barriers to D/C:            Co-evaluation              AM-PAC OT "6 Clicks" Daily Activity     Outcome Measure Help from another person eating meals?: A Lot Help from another person taking care of personal grooming?: A Lot Help from another person toileting, which includes using toliet, bedpan, or urinal?: Total Help from another person bathing (including washing, rinsing, drying)?: Total Help from another person to put  on and taking off regular upper body clothing?: A Lot Help from another person to put on and taking off regular lower body clothing?: Total 6 Click Score: 9   End of Session Equipment Utilized During Treatment: Oxygen Nurse Communication: Mobility status  Activity Tolerance: Patient limited by pain Patient left: in bed;with call bell/phone within reach;with bed alarm set  OT Visit Diagnosis: Muscle weakness (generalized) (M62.81);Other abnormalities of gait and mobility (R26.89)                Time: 0092-3300 OT Time Calculation (min): 16 min Charges:  OT General Charges $OT Visit: 1 Visit OT Evaluation $OT Eval Moderate Complexity: 1 Mod  Ioma Chismar H., OTR/L Acute Rehabilitation  Conny Moening Elane Bing Plume 10/24/2021, 2:58 PM

## 2021-10-24 NOTE — Plan of Care (Signed)
  Problem: Pain Managment: Goal: General experience of comfort will improve Outcome: Progressing   Problem: Skin Integrity: Goal: Risk for impaired skin integrity will decrease Outcome: Progressing   Problem: Activity: Goal: Ability to tolerate increased activity will improve Outcome: Progressing   Problem: Respiratory: Goal: Ability to maintain adequate ventilation will improve Outcome: Progressing

## 2021-10-24 NOTE — Progress Notes (Signed)
Pt continues to only sing and have tangential thoughts when this RN would attempt to converse with him. On occasion he will state how he feels or that his throat hurts but it is intermittent.

## 2021-10-24 NOTE — Progress Notes (Signed)
PROGRESS NOTE    RHYATT MUSKA  UEA:[id number] DOB: 1935-12-22 DOA: 10/20/2021 PCP: Karna Dupes, MD    Brief Narrative:  86 year old with dementia, seizure disorder, BPH, GI bleeding brought to the emergency room with sudden onset of shortness of breath and rhonchi noted at nursing home.  He is a long-term nursing home resident.  Found to have bilateral lower lobe pneumonia as well as positive COVID-19 test.  He was tested positive for COVID in May of this year, he had negative test in between.  Treated with Zosyn as aspiration pneumonia as well as remdesivir. Remains eating poor. Started on tube feeding on 10/29   Assessment & Plan:   Principal Problem:   Pneumonia Active Problems:   Dyslipidemia   Essential hypertension   Dementia (HCC)   Seizure (HCC)   COVID  Bilateral lower lobe pneumonia, likely aspiration pneumonia. Continue IV Zosyn. Chest physiotherapy, incentive spirometry, deep breathing exercises, sputum induction, mucolytic's and bronchodilators. Cultures negative so far. Supplemental oxygen to keep saturations more than 90%. On room air today. Failed speech swallow eval, some improvement of mentation. Keep NPO until repeat Swallow. NG tube inserted and started on tube feeding.  Continue until repeat swallow evaluation.  COVID-19 infection: Likely causing debilitating condition and aggravating his bacterial pneumonia. Supplemental oxygen to keep saturations more than 90%.  Chest physiotherapy as above. Covid directed therapy with , steroids, not indicated remdesivir, day 4/5. And pulmonary markers are trending down.  Lower extremity duplex is negative for DVT.  D-dimer trended down.  History of seizure on Depakote and Keppra: Keppra IV on increasing dose.  Depakote IV unavailable due to back orders. Back on oral Keppra and Depakote. Also started on his dementia medications.  History of dementia: Lives in long-term nursing home.  PT OT . Start  mobilizing.   DVT prophylaxis: enoxaparin (LOVENOX) injection 40 mg Start: 10/21/21 1000   Code Status: Full code Family Communication: Son on the phone 10/29. Disposition Plan: Status is: Inpatient  Remains inpatient appropriate because: persistent lethargy, IV antibiotics.         Consultants:  None   Procedures:  None   Antimicrobials:  Anti-infectives (From admission, onward)    Start     Dose/Rate Route Frequency Ordered Stop   10/22/21 1000  remdesivir 100 mg in sodium chloride 0.9 % 100 mL IVPB       See Hyperspace for full Linked Orders Report.   100 mg 200 mL/hr over 30 Minutes Intravenous Daily 10/21/21 0238 10/26/21 0959   10/21/21 0800  piperacillin-tazobactam (ZOSYN) IVPB 3.375 g        3.375 g 12.5 mL/hr over 240 Minutes Intravenous Every 8 hours 10/21/21 0240     10/21/21 0300  remdesivir 200 mg in sodium chloride 0.9% 250 mL IVPB       See Hyperspace for full Linked Orders Report.   200 mg 580 mL/hr over 30 Minutes Intravenous Once 10/21/21 0238 10/21/21 0536   10/20/21 2200  piperacillin-tazobactam (ZOSYN) IVPB 3.375 g        3.375 g 100 mL/hr over 30 Minutes Intravenous  Once 10/20/21 2150 10/21/21 0303          Subjective: Patient seen and examined.  No change in condition.  Keeps eyes closed.  Responds to voice.  Says yes and no.  Can tell me his wife's and his son's name. Tube feeding infusing.  Still has a weak cough.  Objective: Vitals:   10/23/21 1140 10/23/21 2025 10/24/21 0431  10/24/21 0500  BP: (!) 174/86 (!) 158/97 (!) 150/84   Pulse:  68 72   Resp: 18 20 18    Temp:  98.2 F (36.8 C) 97.9 F (36.6 C)   TempSrc:      SpO2: 94% 94% 100%   Weight:    79.6 kg  Height:        Intake/Output Summary (Last 24 hours) at 10/24/2021 1050 Last data filed at 10/24/2021 0436 Gross per 24 hour  Intake 707.26 ml  Output 600 ml  Net 107.26 ml   Filed Weights   10/21/21 0230 10/24/21 0500  Weight: 72.6 kg 79.6 kg     Examination:  General exam: Calm and quiet.  Frail and debilitated. Alert to voice.  Not oriented to time and place. Respiratory system: Mostly clear.  No added sounds. Cardiovascular system: S1 & S2 heard, RRR.  Gastrointestinal system: Abdomen is nondistended, soft and nontender. No organomegaly or masses felt. Normal bowel sounds heard. NG tube infusing. Central nervous system: Sleepy.  But awake to conversation. He has 1+ edema all extremities, LE both tender on dorsum of feet.    Data Reviewed: I have personally reviewed following labs and imaging studies  CBC: Recent Labs  Lab 10/20/21 2029 10/21/21 0229 10/22/21 0253 10/23/21 0757 10/24/21 0116  WBC 4.7 5.6 5.2 5.5 5.1  NEUTROABS 3.5 3.7 3.2 3.3 2.9  HGB 11.1* 12.4* 12.6* 13.2 11.1*  HCT 35.7* 38.9* 40.7 42.0 35.9*  MCV 87.7 84.7 87.9 87.1 87.3  PLT 217 248 227 195 177   Basic Metabolic Panel: Recent Labs  Lab 10/20/21 2029 10/21/21 0229 10/22/21 0253 10/23/21 0757 10/24/21 0116  NA 141 141 143 145 145  K 3.9 4.0 4.0 4.0 3.5  CL 107 105 104 107 110  CO2 27 29 31 30 30   GLUCOSE 125* 107* 94 121* 119*  BUN 27* 24* 24* 20 17  CREATININE 1.09 1.11 1.10 0.97 0.88  CALCIUM 8.1* 8.9 8.8* 9.0 8.4*   GFR: Estimated Creatinine Clearance: 70.4 mL/min (by C-G formula based on SCr of 0.88 mg/dL). Liver Function Tests: Recent Labs  Lab 10/20/21 2029 10/21/21 0229 10/22/21 0253 10/23/21 0757 10/24/21 0116  AST 26 26 48* 40 30  ALT 28 32 33 30 25  ALKPHOS 64 73 84 72 71  BILITOT 0.6 0.4 0.6 0.8 0.8  PROT 5.2* 5.9* 6.0* 5.9* 5.2*  ALBUMIN 2.0* 2.2* 2.2* 2.3* 2.0*   No results for input(s): LIPASE, AMYLASE in the last 168 hours. No results for input(s): AMMONIA in the last 168 hours. Coagulation Profile: Recent Labs  Lab 10/20/21 2029  INR 1.3*   Cardiac Enzymes: No results for input(s): CKTOTAL, CKMB, CKMBINDEX, TROPONINI in the last 168 hours. BNP (last 3 results) No results for input(s): PROBNP  in the last 8760 hours. HbA1C: No results for input(s): HGBA1C in the last 72 hours. CBG: Recent Labs  Lab 10/23/21 1543 10/23/21 2027 10/23/21 2313 10/24/21 0428 10/24/21 0849  GLUCAP 121* 117* 107* 109* 111*   Lipid Profile: No results for input(s): CHOL, HDL, LDLCALC, TRIG, CHOLHDL, LDLDIRECT in the last 72 hours. Thyroid Function Tests: No results for input(s): TSH, T4TOTAL, FREET4, T3FREE, THYROIDAB in the last 72 hours. Anemia Panel: No results for input(s): VITAMINB12, FOLATE, FERRITIN, TIBC, IRON, RETICCTPCT in the last 72 hours. Sepsis Labs: Recent Labs  Lab 10/20/21 2029  LATICACIDVEN 1.4    Recent Results (from the past 240 hour(s))  Blood culture (routine single)     Status: None (Preliminary  result)   Collection Time: 10/20/21  8:29 PM   Specimen: BLOOD  Result Value Ref Range Status   Specimen Description BLOOD SITE NOT SPECIFIED  Final   Special Requests   Final    BOTTLES DRAWN AEROBIC AND ANAEROBIC Blood Culture results may not be optimal due to an inadequate volume of blood received in culture bottles   Culture   Final    NO GROWTH 4 DAYS Performed at [facility], [address], [place], [place] [address]    Report Status PENDING  Incomplete  Urine Culture     Status: Abnormal   Collection Time: 10/20/21  8:29 PM   Specimen: In/Out Cath Urine  Result Value Ref Range Status   Specimen Description IN/OUT CATH URINE  Final   Special Requests   Final    NONE Performed at [facility], [address] [place], [place] [address]    Culture MULTIPLE SPECIES PRESENT, SUGGEST RECOLLECTION (A)  Final   Report Status 10/21/2021 FINAL  Final  Resp Panel by RT-PCR (Flu A&B, Covid) Nasopharyngeal Swab     Status: Abnormal   Collection Time: 10/20/21  8:31 PM   Specimen: Nasopharyngeal Swab; Nasopharyngeal(NP) swabs in vial transport medium  Result Value Ref Range Status   SARS Coronavirus 2 by RT PCR POSITIVE (A) NEGATIVE Final    Comment:  RESULT CALLED TO, READ BACK BY AND VERIFIED WITH: S NOLTE,RN@[id number]  10/20/21 MK (NOTE) SARS-CoV-2 target nucleic acids are DETECTED.  The SARS-CoV-2 RNA is generally detectable in upper respiratory specimens during the acute phase of infection. Positive results are indicative of the presence of the identified virus, but do not rule out bacterial infection or co-infection with other pathogens not detected by the test. Clinical correlation with patient history and other diagnostic information is necessary to determine patient infection status. The expected result is Negative.  Fact Sheet for Patients: BloggerCourse.com  Fact Sheet for Healthcare Providers: SeriousBroker.it  This test is not yet approved or cleared by the [place] FDA and  has been authorized for detection and/or diagnosis of SARS-CoV-2 by FDA under an Emergency Use Authorization (EUA).  This EUA will remain in effect (meaning this test can be used)  for the duration of  the COVID-19 declaration under Section 564(b)(1) of the Act, 21 U.S.C. section 360bbb-3(b)(1), unless the authorization is terminated or revoked sooner.     Influenza A by PCR NEGATIVE NEGATIVE Final   Influenza B by PCR NEGATIVE NEGATIVE Final    Comment: (NOTE) The Xpert Xpress SARS-CoV-2/FLU/RSV plus assay is intended as an aid in the diagnosis of influenza from Nasopharyngeal swab specimens and should not be used as a sole basis for treatment. Nasal washings and aspirates are unacceptable for Xpert Xpress SARS-CoV-2/FLU/RSV testing.  Fact Sheet for Patients: BloggerCourse.com  Fact Sheet for Healthcare Providers: SeriousBroker.it  This test is not yet approved or cleared by the [place] FDA and has been authorized for detection and/or diagnosis of SARS-CoV-2 by FDA under an Emergency Use Authorization (EUA). This EUA will remain in  effect (meaning this test can be used) for the duration of the COVID-19 declaration under Section 564(b)(1) of the Act, 21 U.S.C. section 360bbb-3(b)(1), unless the authorization is terminated or revoked.  Performed at [place] [facility], [address], [place], [place] [address]          Radiology Studies: DG Abd 1 View  Result Date: 10/23/2021 CLINICAL DATA:  Nasogastric tube placement. EXAM: ABDOMEN - 1  VIEW COMPARISON:  August 24, 2014. FINDINGS: Nasogastric tube tip is seen in the expected position of the stomach. No abnormal bowel dilatation is noted. Residual contrast is noted in the colon. IMPRESSION: Nasogastric tube tip seen in expected position of the stomach. No abnormal bowel dilatation is noted. Electronically Signed   By: Lupita Raider M.D.   On: 10/23/2021 14:29   DG Swallowing Func-Speech Pathology  Result Date: 10/22/2021 Table formatting from the original result was not included. Objective Swallowing Evaluation: Type of Study: MBS-Modified Barium Swallow Study  Patient Details Name: ABRAR BILTON MRN: [id number] Date of Birth: 11-Oct-1935 Today's Date: 10/22/2021 Time: SLP Start Time (ACUTE ONLY): 1410 -SLP Stop Time (ACUTE ONLY): 1428 SLP Time Calculation (min) (ACUTE ONLY): 18 min Past Medical History: Past Medical History: Diagnosis Date  Allergy   Dementia (HCC)   sees Dr. Patrcia Dolly   Diverticulosis of colon (without mention of hemorrhage) 03-01-2004, 04-04-2011  Colonoscopy  ED (erectile dysfunction)   Glaucoma   sees Dr. Arvil Chaco   Hyperglycemia   Hyperlipidemia   Hypertension   Internal hemorrhoids 03-15-1999  Flex Sig   Irritable bowel  Past Surgical History: Past Surgical History: Procedure Laterality Date  COLONOSCOPY  04-04-11  per Dr. Jarold Motto, clear, no repeats needed  EYE SURGERY    bilateral cataract extraction per Dr Jettie Pagan  INGUINAL HERNIA REPAIR    KNEE ARTHROSCOPY    right knee  NASAL SINUS SURGERY   HPI: Pt is an 86 y/o male admitted 10/26 from nursing  home with respiratory distress/concern for aspiration. CXR 10/26: Bilateral lower lung airspace disease, left greater than right, concerning for infection. PMH: dementia, seizures, BPH, GI bleed, HTN, HLD, hyperglycemia.  No data recorded Assessment / Plan / Recommendation CHL IP CLINICAL IMPRESSIONS 10/22/2021 Clinical Impression Pt presents with oropharyngeal dysphagia with significant impacts related to reduced mentation and deconditioning. Pt lethargic and with reduced attention during testing this date and follwing of commands was intermittent, requiring consistent cueing from clinician. Swallow function c/b premature spillage of liquids, reduced pharyngeal peristalsis and UES opening resulting in residuals of all consistencies in the vallecular space and pyriform sinuses. Aspiration of thin and NTL noted (each x1) after the swallow with pyriform residuals falling into the airway, passing below the cords and pt demonstrating immediate cough response (PAS 7). Pharyngeal residuals increased with thicker consistencies with posterior face of epiglottis coated in significant residue post HTL trial. As testing progressed and pt's positioning reduced, view of laryngeal vestibule was reduced and clinician unable to determine any further aspiration events, however pt's risk is high.  Recommend NPO with ice chips after thorough oral care at this time. Short term alternative means of nutrition may need to be considered given pt's mentation and deconditioning. SLP to f/u for PO readiness.  SLP Visit Diagnosis Dysphagia, oropharyngeal phase (R13.12) Attention and concentration deficit following -- Frontal lobe and executive function deficit following -- Impact on safety and function Severe aspiration risk   CHL IP TREATMENT RECOMMENDATION 10/22/2021 Treatment Recommendations Therapy as outlined in treatment plan below;F/U MBS in --- days (Comment)   Prognosis 10/22/2021 Prognosis for Safe Diet Advancement Fair Barriers to  Reach Goals Severity of deficits;Cognitive deficits Barriers/Prognosis Comment -- CHL IP DIET RECOMMENDATION 10/22/2021 SLP Diet Recommendations NPO;Ice chips PRN after oral care Liquid Administration via Spoon Medication Administration Via alternative means Compensations Minimize environmental distractions;Slow rate;Small sips/bites;Clear throat intermittently Postural Changes Seated upright at 90 degrees;Remain semi-upright after after feeds/meals (Comment)   CHL IP OTHER  RECOMMENDATIONS 10/22/2021 Recommended Consults -- Oral Care Recommendations Oral care QID;Staff/trained caregiver to provide oral care Other Recommendations --   CHL IP FOLLOW UP RECOMMENDATIONS 10/22/2021 Follow up Recommendations Skilled Nursing facility   [facility] IP FREQUENCY AND DURATION 10/22/2021 Speech Therapy Frequency (ACUTE ONLY) min 2x/week Treatment Duration 2 weeks      CHL IP ORAL PHASE 10/22/2021 Oral Phase Impaired Oral - Pudding Teaspoon -- Oral - Pudding Cup -- Oral - Honey Teaspoon -- Oral - Honey Cup -- Oral - Nectar Teaspoon -- Oral - Nectar Cup -- Oral - Nectar Straw -- Oral - Thin Teaspoon -- Oral - Thin Cup Premature spillage Oral - Thin Straw Premature spillage Oral - Puree -- Oral - Mech Soft -- Oral - Regular -- Oral - Multi-Consistency -- Oral - Pill -- Oral Phase - Comment --  CHL IP PHARYNGEAL PHASE 10/22/2021 Pharyngeal Phase Impaired Pharyngeal- Pudding Teaspoon -- Pharyngeal -- Pharyngeal- Pudding Cup -- Pharyngeal -- Pharyngeal- Honey Teaspoon -- Pharyngeal -- Pharyngeal- Honey Cup Pharyngeal residue - valleculae;Pharyngeal residue - pyriform;Reduced pharyngeal peristalsis Pharyngeal -- Pharyngeal- Nectar Teaspoon -- Pharyngeal -- Pharyngeal- Nectar Cup Reduced pharyngeal peristalsis;Penetration/Apiration after swallow;Moderate aspiration;Pharyngeal residue - valleculae;Pharyngeal residue - pyriform Pharyngeal Material enters airway, passes BELOW cords and not ejected out despite cough attempt by patient Pharyngeal-  Nectar Straw Reduced pharyngeal peristalsis;Penetration/Apiration after swallow;Moderate aspiration;Pharyngeal residue - valleculae;Pharyngeal residue - pyriform Pharyngeal Material enters airway, passes BELOW cords and not ejected out despite cough attempt by patient Pharyngeal- Thin Teaspoon -- Pharyngeal -- Pharyngeal- Thin Cup -- Pharyngeal -- Pharyngeal- Thin Straw Reduced pharyngeal peristalsis;Penetration/Apiration after swallow;Moderate aspiration;Pharyngeal residue - valleculae;Pharyngeal residue - pyriform Pharyngeal Material enters airway, passes BELOW cords and not ejected out despite cough attempt by patient Pharyngeal- Puree Pharyngeal residue - valleculae;Pharyngeal residue - pyriform;Reduced pharyngeal peristalsis Pharyngeal -- Pharyngeal- Mechanical Soft -- Pharyngeal -- Pharyngeal- Regular NT Pharyngeal -- Pharyngeal- Multi-consistency -- Pharyngeal -- Pharyngeal- Pill NT Pharyngeal -- Pharyngeal Comment --  No flowsheet data found. Avie Echevaria, MA, CCC-SLP Acute Rehabilitation Services Office Number: [phone] Paulette Blanch 10/22/2021, 4:19 PM              VAS [place] LOWER EXTREMITY VENOUS (DVT)  Result Date: 10/24/2021  Lower Venous DVT Study Patient Name:  EBEN CHOINSKI  Date of Exam:   10/23/2021 Medical Rec #: [id number]         Accession #:    [id number] Date of Birth: 1935-04-28         Patient Gender: M Patient Age:   75 years Exam Location:  [facility] Procedure:      VAS [place] LOWER EXTREMITY VENOUS (DVT) Referring Phys: Dorcas Carrow --------------------------------------------------------------------------------  Indications: Swelling, and Covid.  Limitations: Patient would not allow tech to reposition him. Comparison Study: Prior negative LEV done 05/08/21 Performing Technologist: Jean Rosenthal RDMS, RVT  Examination Guidelines: A complete evaluation includes B-mode imaging, spectral Doppler, color Doppler, and power Doppler as needed of all accessible portions of each  vessel. Bilateral testing is considered an integral part of a complete examination. Limited examinations for reoccurring indications may be performed as noted. The reflux portion of the exam is performed with the patient in reverse Trendelenburg.  +---------+---------------+---------+-----------+----------+-------------------+ RIGHT    CompressibilityPhasicitySpontaneityPropertiesThrombus Aging      +---------+---------------+---------+-----------+----------+-------------------+ CFV      Full                                                             +---------+---------------+---------+-----------+----------+-------------------+  SFJ      Full                                                             +---------+---------------+---------+-----------+----------+-------------------+ FV Prox  Full                                                             +---------+---------------+---------+-----------+----------+-------------------+ FV Mid   Full                                                             +---------+---------------+---------+-----------+----------+-------------------+ FV DistalFull                                                             +---------+---------------+---------+-----------+----------+-------------------+ PFV      Full                                                             +---------+---------------+---------+-----------+----------+-------------------+ POP      Full                                                             +---------+---------------+---------+-----------+----------+-------------------+ PTV                                                   Not well visualized +---------+---------------+---------+-----------+----------+-------------------+ PERO     Full                                                             +---------+---------------+---------+-----------+----------+-------------------+    +---------+---------------+---------+-----------+----------+--------------+ LEFT     CompressibilityPhasicitySpontaneityPropertiesThrombus Aging +---------+---------------+---------+-----------+----------+--------------+ CFV      Full                                                        +---------+---------------+---------+-----------+----------+--------------+ SFJ  Full                                                        +---------+---------------+---------+-----------+----------+--------------+ FV Prox  Full                                                        +---------+---------------+---------+-----------+----------+--------------+ FV Mid   Full                                                        +---------+---------------+---------+-----------+----------+--------------+ FV DistalFull                                                        +---------+---------------+---------+-----------+----------+--------------+ PFV      Full                                                        +---------+---------------+---------+-----------+----------+--------------+ POP      Full                                                        +---------+---------------+---------+-----------+----------+--------------+ PTV      Full                                                        +---------+---------------+---------+-----------+----------+--------------+ PERO     Full                                                        +---------+---------------+---------+-----------+----------+--------------+     Summary: RIGHT: - There is no evidence of deep vein thrombosis in the lower extremity. However, portions of this examination were limited- see technologist comments above.  pulsatile waveforms noted throughout  LEFT: - There is no evidence of deep vein thrombosis in the lower extremity.  - A cystic structure is found in the popliteal fossa.  Pulsatile waveforms noted throughout.  *See table(s) above for measurements and observations. Electronically signed by Heath Lark on 10/24/2021 at 9:30:10 AM.    Final         Scheduled Meds:  brinzolamide  1 drop Both Eyes TID   chlorhexidine  15 mL Mouth  Rinse BID   donepezil  10 mg Per Tube QHS   enoxaparin (LOVENOX) injection  40 mg Subcutaneous Q24H   latanoprost  1 drop Both Eyes QHS   levETIRAcetam  500 mg Oral BID   mouth rinse  15 mL Mouth Rinse q12n4p   memantine  10 mg Per Tube BID   risperiDONE  0.5 mg Per Tube QHS   valproic acid  500 mg Per Tube BID   Continuous Infusions:  sodium chloride Stopped (10/22/21 1658)   feeding supplement (JEVITY 1.2 CAL) 1,000 mL (10/23/21 1625)   piperacillin-tazobactam (ZOSYN)  IV 3.375 g (10/24/21 0944)   remdesivir 100 mg in NS 100 mL 100 mg (10/23/21 0944)     LOS: 4 days    Time spent: 30 minutes    Dorcas Carrow, MD [facility] Pager [phone]

## 2021-10-25 ENCOUNTER — Inpatient Hospital Stay (HOSPITAL_COMMUNITY): Payer: Medicare Other

## 2021-10-25 DIAGNOSIS — U071 COVID-19: Secondary | ICD-10-CM | POA: Diagnosis not present

## 2021-10-25 DIAGNOSIS — R569 Unspecified convulsions: Secondary | ICD-10-CM | POA: Diagnosis not present

## 2021-10-25 DIAGNOSIS — I10 Essential (primary) hypertension: Secondary | ICD-10-CM | POA: Diagnosis not present

## 2021-10-25 DIAGNOSIS — J189 Pneumonia, unspecified organism: Secondary | ICD-10-CM | POA: Diagnosis not present

## 2021-10-25 DIAGNOSIS — Z515 Encounter for palliative care: Secondary | ICD-10-CM | POA: Diagnosis not present

## 2021-10-25 DIAGNOSIS — Z7189 Other specified counseling: Secondary | ICD-10-CM

## 2021-10-25 DIAGNOSIS — J690 Pneumonitis due to inhalation of food and vomit: Secondary | ICD-10-CM

## 2021-10-25 DIAGNOSIS — F01518 Vascular dementia, unspecified severity, with other behavioral disturbance: Secondary | ICD-10-CM | POA: Diagnosis not present

## 2021-10-25 LAB — COMPREHENSIVE METABOLIC PANEL
ALT: 25 U/L (ref 0–44)
AST: 28 U/L (ref 15–41)
Albumin: 2 g/dL — ABNORMAL LOW (ref 3.5–5.0)
Alkaline Phosphatase: 73 U/L (ref 38–126)
Anion gap: 7 (ref 5–15)
BUN: 16 mg/dL (ref 8–23)
CO2: 29 mmol/L (ref 22–32)
Calcium: 8.6 mg/dL — ABNORMAL LOW (ref 8.9–10.3)
Chloride: 106 mmol/L (ref 98–111)
Creatinine, Ser: 0.87 mg/dL (ref 0.61–1.24)
GFR, Estimated: >60 mL/min (ref >60)
Glucose, Bld: 139 mg/dL — ABNORMAL HIGH (ref 70–99)
Potassium: 3.6 mmol/L (ref 3.5–5.1)
Sodium: 142 mmol/L (ref 135–145)
Total Bilirubin: 0.5 mg/dL (ref 0.3–1.2)
Total Protein: 5.6 g/dL — ABNORMAL LOW (ref 6.5–8.1)

## 2021-10-25 LAB — D-DIMER, QUANTITATIVE: D-Dimer, Quant: 1.67 ug/mL-FEU — ABNORMAL HIGH (ref 0.00–0.50)

## 2021-10-25 LAB — MRSA NEXT GEN BY PCR, NASAL: MRSA by PCR Next Gen: DETECTED — AB

## 2021-10-25 LAB — GLUCOSE, CAPILLARY
Glucose-Capillary: 106 mg/dL — ABNORMAL HIGH (ref 70–99)
Glucose-Capillary: 134 mg/dL — ABNORMAL HIGH (ref 70–99)
Glucose-Capillary: 137 mg/dL — ABNORMAL HIGH (ref 70–99)
Glucose-Capillary: 140 mg/dL — ABNORMAL HIGH (ref 70–99)
Glucose-Capillary: 149 mg/dL — ABNORMAL HIGH (ref 70–99)
Glucose-Capillary: 156 mg/dL — ABNORMAL HIGH (ref 70–99)

## 2021-10-25 LAB — CULTURE, BLOOD (SINGLE): Culture: NO GROWTH

## 2021-10-25 MED ORDER — MUPIROCIN 2 % EX OINT
1.0000 "application " | TOPICAL_OINTMENT | Freq: Two times a day (BID) | CUTANEOUS | Status: AC
  Administered 2021-10-25 – 2021-10-30 (×10): 1 via NASAL
  Filled 2021-10-25 (×3): qty 22

## 2021-10-25 MED ORDER — CHLORHEXIDINE GLUCONATE CLOTH 2 % EX PADS
6.0000 | MEDICATED_PAD | Freq: Every day | CUTANEOUS | Status: AC
  Administered 2021-10-26 – 2021-10-29 (×3): 6 via TOPICAL

## 2021-10-25 NOTE — Progress Notes (Signed)
Speech Language Pathology Treatment: Dysphagia  Patient Details Name: [name] [name] MRN: [id number] DOB: August 23, 1935 Today's Date: 10/25/2021 Time: 1012-1031 SLP Time Calculation (min) (ACUTE ONLY): 19 min  Assessment / Plan / Recommendation Clinical Impression  Pt lethargic upon SLP arrival, but awakened with verbal greeting and tactile stimulation, though with eyes closed as noted in previous visits. Overall mentation appears slightly improved as indicated by his ability to follow basic commands with reduced cueing. Baseline vocal quality prior to POs was significantly wet and gurgly, concerning for poor secretion management. Cued volitional coughs successful in bringing up few secretions, although not sufficient to improve vocal quality and pt inconsistent with providing strong cough. Oral care provided and ice chips x5 trialed with pt demonstrating swift oral acceptance and adequate oral manipulation/mastication. Volitional swallow noted via palpation, followed by x1 spontaneous cough response and continued wet vocal quality.  X1 volitional swallow of secretions noted at end of session, but otherwise pt largely vocalized or performed weak cough/throat clear to move secretions. Pt to remain NPO with few ice chips after oral care. RN/MD report switch to cortrak this date and suspect this may provide more comfort to pt to spontaneously swallowing secretions. SLP to f/u.   HPI HPI: Pt is an 86 y/o male admitted 10/26 from nursing home with respiratory distress/concern for aspiration. CXR 10/26: Bilateral lower lung airspace disease, left greater than right, concerning for infection. PMH: dementia, seizures, BPH, GI bleed, HTN, HLD, hyperglycemia.      SLP Plan  Continue with current plan of care      Recommendations for follow up therapy are one component of a multi-disciplinary discharge planning process, led by the attending physician.  Recommendations may be updated based on patient status,  additional functional criteria and insurance authorization.    Recommendations  Diet recommendations: NPO;Other(comment) (ice chips after oral care) Liquids provided via: Teaspoon Medication Administration: Via alternative means Supervision: Full supervision/cueing for compensatory strategies Compensations: Minimize environmental distractions;Slow rate Postural Changes and/or Swallow Maneuvers: Seated upright 90 degrees                Oral Care Recommendations: Oral care QID;Staff/trained caregiver to provide oral care Follow up Recommendations: Skilled Nursing facility SLP Visit Diagnosis: Dysphagia, oropharyngeal phase (R13.12) Plan: Continue with current plan of care       GO               Avie Echevaria, MA, CCC-SLP Acute Rehabilitation Services Office Number: [phone]  Paulette Blanch  10/25/2021, 10:54 AM

## 2021-10-25 NOTE — Consult Note (Signed)
Consultation Note Date: 10/25/2021   Patient Name: [name] [name]  DOB: 1935-05-05  MRN: [id number]  Age / Sex: 86 y.o., male  PCP: Raymondo Band, MD Referring Physician: Barb Merino, MD  Reason for Consultation: Establish GOC  HPI/Patient Profile: 86 y.o. male  admitted on 10/20/2021   with dementia, seizure disorder, BPH, GI bleeding brought to the emergency room with sudden onset of shortness of breath and rhonchi noted at nursing home.    He is a long-term nursing home resident/[facility],  found to have bilateral lower lobe pneumonia as well as positive COVID-19 test.  He was tested positive for COVID in May of this year, he had negative test in between.  Treated with Zosyn as aspiration pneumonia as well as remdesivir.  Started on tube feeding on 10/29 with persistent inability to eat.  Family report continued physical and functional and cognitive decline over the past many months.  He has had 2 different skilled nursing facility admissions, failed attempts at home and multiple ER and hospital admissions over the past several months   Currently he is weak, with cor-trac for nutritional support.  Frail and high risk for decompensation.    Family face treatment option decisions and advanced directive decisions and anticipatory care needs.     Clinical Assessment and Goals of Care:  This NP Wadie Lessen reviewed medical records, received report from team, spoke the patient's wife and son by telphone to discuss diagnosis, prognosis, GOC, EOL wishes disposition and options.   Concept of Palliative Care was introduced as specialized medical care for people and their families living with serious illness.  If focuses on providing relief from the symptoms and stress of a serious illness.  The goal is to improve quality of life for both the patient and the family.  A  discussion was had today  regarding advanced directives.  Concepts specific to code status, artifical feeding and hydration, continued IV antibiotics and rehospitalization was had.    The difference between a aggressive medical intervention path  and a palliative comfort care path for this patient at this time was had.    Explored concepts of human mortality, adult failure to thrive and the limitations of medical interventions to prolong quality of life when a body fails to thrive.   Natural trajectory and expectations at EOL were discussed.    Questions and concerns addressed.  Family encouraged to call with questions or concerns.     PMT will continue to support holistically.  Family will benefit from ongoing support as they navigate hard choices.      Next of Kin/wife/ HPOA    SUMMARY OF RECOMMENDATIONS    Code Status/Advance Care Planning: Full code  Educated patient/family to consider DNR/DNI status understanding evidenced based poor outcomes in similar hospitalized patient, as the cause of arrest is likely associated with advanced chronic illness rather than an easily reversible acute cardio-pulmonary event.     Palliative Prophylaxis:  Aspiration precaution, frequent pain assessment  Additional Recommendations (Limitations, Scope, Preferences):  Full scope, family is hopeful for return to baseline, at baseline patient is bed bound and total assist  Psycho-social/Spiritual:  Desire for further Chaplaincy support: decline, they have family that are pastors   Prognosis:  Long term prognosis is guarded 2/2 to multiple co-morbidities--will depend on desire for life prolonging measures vs allowing for a natural death  Discharge Planning:  to be determined       Primary Diagnoses: Present on Admission:  Pneumonia  Essential hypertension  Dyslipidemia  Dementia ([place])  COVID   I have reviewed the medical record, interviewed the patient and family, and examined the patient. The following aspects  are pertinent.  Past Medical History:  Diagnosis Date   Allergy    Dementia [facility])    sees Dr. Ellouise Newer    Diverticulosis of colon (without mention of hemorrhage) 03-01-2004, 04-04-2011   Colonoscopy   ED (erectile dysfunction)    Glaucoma    sees Dr. Crecencio Mc    Hyperglycemia    Hyperlipidemia    Hypertension    Internal hemorrhoids 03-15-1999   Flex Sig    Irritable bowel    Social History   Socioeconomic History   Marital status: Married    Spouse name: Not on file   Number of children: 2   Years of education: 11   Highest education level: Not on file  Occupational History   Occupation: retired    Fish farm manager: RETIRED  Tobacco Use   Smoking status: Former    Types: Cigarettes    Quit date: 03/20/1969    Years since quitting: 52.6   Smokeless tobacco: Never  Vaping Use   Vaping Use: Never used  Substance and Sexual Activity   Alcohol use: Yes    Alcohol/week: 0.0 standard drinks    Comment: occ   Drug use: No   Sexual activity: Not on file  Other Topics Concern   Not on file  Social History Narrative   Right handed   Drinks caffeine   One story home   Social Determinants of Health   Financial Resource Strain: Not on file  Food Insecurity: Not on file  Transportation Needs: Not on file  Physical Activity: Not on file  Stress: Not on file  Social Connections: Not on file   Family History  Problem Relation Age of Onset   Hypertension Mother    Lung cancer Father    Colitis Daughter    Scheduled Meds:  brinzolamide  1 drop Both Eyes TID   chlorhexidine  15 mL Mouth Rinse BID   donepezil  10 mg Per Tube QHS   enoxaparin (LOVENOX) injection  40 mg Subcutaneous Q24H   latanoprost  1 drop Both Eyes QHS   levETIRAcetam  500 mg Oral BID   mouth rinse  15 mL Mouth Rinse q12n4p   memantine  10 mg Per Tube BID   risperiDONE  0.5 mg Per Tube QHS   valproic acid  500 mg Per Tube BID   Continuous Infusions:  sodium chloride Stopped (10/22/21 1658)    feeding supplement (JEVITY 1.2 CAL) 60 mL (10/24/21 2018)   piperacillin-tazobactam (ZOSYN)  IV 3.375 g (10/25/21 0930)   PRN Meds:.sodium chloride, acetaminophen **OR** acetaminophen, melatonin Medications Prior to Admission:  Prior to Admission medications   Medication Sig Start Date End Date Taking? Authorizing Provider  acetaminophen (TYLENOL) 500 MG tablet Take 1,000 mg by mouth 3 (three) times daily.   Yes [provider]  AZOPT 1 % ophthalmic suspension Place 1  drop into both eyes 3 (three) times daily. 12/18/19  Yes [provider]  Coenzyme Q10 (CO Q 10 PO) Take 30 mg by mouth daily.   Yes [provider]  divalproex (DEPAKOTE ER) 500 MG 24 hr tablet Take 2 tablets (1,000 mg total) by mouth daily. Take 1 tablet every night Patient taking differently: Take 1,000 mg by mouth at bedtime. 05/12/21  Yes Adhikari, Tamsen Meek, MD  donepezil (ARICEPT) 10 MG tablet Take 10 mg by mouth at bedtime. 03/06/21  Yes [provider]  fish oil-omega-3 fatty acids 1000 MG capsule Take 1 g by mouth daily.   Yes [provider]  Glucosamine-Chondroitin 500-400 MG CAPS Take 1 tablet by mouth daily.   Yes [provider]  latanoprost (XALATAN) 0.005 % ophthalmic solution Place 1 drop into both eyes at bedtime.   Yes [provider]  levETIRAcetam (KEPPRA) 500 MG tablet TAKE 1 TABLET (500 MG TOTAL) BY MOUTH TWO TIMES DAILY. Patient taking differently: Take 500 mg by mouth 2 (two) times daily. 03/11/21 03/11/22 Yes Eugenie Filler, MD  melatonin 5 MG TABS Take 10 mg by mouth at bedtime.   Yes [provider]  memantine (NAMENDA) 10 MG tablet Take 1 tablet twice a day Patient taking differently: Take 10 mg by mouth 2 (two) times daily. 03/22/21  Yes Cameron Sprang, MD  midodrine (PROAMATINE) 5 MG tablet Take 1 tablet (5 mg total) by mouth 3 (three) times daily with meals. 05/12/21  Yes Shelly Coss, MD  midodrine (PROAMATINE) 5 MG tablet Take  5 mg by mouth daily as needed (SBP<100 or DBP>90).   Yes [provider]  MILK THISTLE PO Take 140 mg by mouth daily.   Yes [provider]  Multiple Vitamins-Minerals (MULTIVITAMIN WITH MINERALS) tablet Take 1 tablet by mouth daily.   Yes [provider]  NON FORMULARY 250 mLs See admin instructions. Give extra 250 ml of fluid every shift for hypenatremia   Yes [provider]  polyethylene glycol (MIRALAX / GLYCOLAX) 17 g packet Take 17 g by mouth daily.   Yes [provider]  RHOPRESSA 0.02 % SOLN Place 1 drop into both eyes at bedtime. 08/17/19  Yes [provider]  risperiDONE (RISPERDAL) 0.5 MG tablet Take 1 tablet (0.5 mg total) by mouth at bedtime. 02/25/20  Yes Laurey Morale, MD  sennosides-docusate sodium (SENOKOT-S) 8.6-50 MG tablet Take 2 tablets by mouth at bedtime.   Yes [provider]  tamsulosin (FLOMAX) 0.4 MG CAPS capsule Take 0.4 mg by mouth every other day.   Yes [provider]   Allergies  Allergen Reactions   Chocolate     Itching if he eats too much chocolate    Ciprofloxacin     Achilles tendon pain    Lipitor [Atorvastatin]     Muscle aches    Review of Systems  Unable to perform ROS  Physical Exam The above conversation was completed via telephone due to the visitor restrictions during the COVID-19 pandemic. Thorough chart review and discussion with necessary members of the care team was completed as part of assessment. All issues were discussed and addressed but no physical exam was performed.    Vital Signs: BP (!) 154/77 (BP Location: Right Arm)   Pulse 83   Temp 98.1 F (36.7 C) (Oral)   Resp 20   Ht 6\' 1"  (1.854 m)   Wt 81.5 kg   SpO2 98%   BMI 23.71 kg/m  Pain  Scale: 0-10 POSS *See Group Information*: S-Acceptable,Sleep, easy to arouse Pain Score: 0-No pain   SpO2: SpO2: 98 % O2 Device:SpO2: 98 % O2 Flow Rate: .O2 Flow Rate (L/min): 2 L/min  IO: Intake/output summary:   Intake/Output Summary (Last 24 hours) at 10/25/2021 1314 Last data filed at 10/25/2021 1200 Gross per 24 hour  Intake 1740.43 ml  Output 500 ml  Net 1240.43 ml    LBM: Last BM Date:  (PTA) Baseline Weight: Weight: 72.6 kg Most recent weight: Weight: 81.5 kg     Palliative Assessment/Data: 30 % at best   Discussed wit Dr Jerral Ralph  Time In: 1300 Time Out: 1415 Time Total: 75 minutes Greater than 50%  of this time was spent counseling and coordinating care related to the above assessment and plan.  Signed by: Lorinda Creed, NP   Please contact Palliative Medicine Team phone at [phone] for questions and concerns.  For individual provider: See Loretha Stapler

## 2021-10-25 NOTE — Plan of Care (Signed)
  Problem: Nutrition: Goal: Adequate nutrition will be maintained Outcome: Progressing   Problem: Elimination: Goal: Will not experience complications related to bowel motility Outcome: Progressing   Problem: Pain Managment: Goal: General experience of comfort will improve Outcome: Progressing   Problem: Skin Integrity: Goal: Risk for impaired skin integrity will decrease Outcome: Progressing   

## 2021-10-25 NOTE — Procedures (Signed)
Cortrak  Tube Type:  Cortrak - 43 inches Tube Location:  Left nare Initial Placement:  Stomach Secured by: Bridle Technique Used to Measure Tube Placement:  Marking at nare/corner of mouth Cortrak Secured At:  70 cm  Cortrak Tube Team Note:  Consult received to place a Cortrak feeding tube.   X-ray is required, abdominal x-ray has been ordered by the Cortrak team. Please confirm tube placement before using the Cortrak tube.   If the tube becomes dislodged please keep the tube and contact the Cortrak team at [email or web address] (password [email or web address]) for replacement.  If after hours and replacement cannot be delayed, place a NG tube and confirm placement with an abdominal x-ray.    Ashyr Hedgepath MS, RD, LDN Please refer to AMION for RD and/or RD on-call/weekend/after hours pager   

## 2021-10-25 NOTE — Progress Notes (Addendum)
PROGRESS NOTE    [name] [name]  QIH:[id number] DOB: [birth year] DOA: 10/20/2021 PCP: [name] Dupes, MD    Brief Narrative:  86 year old with dementia, seizure disorder, BPH, GI bleeding brought to the emergency room with sudden onset of shortness of breath and rhonchi noted at nursing home.  He is a long-term nursing home resident.  Found to have bilateral lower lobe pneumonia as well as positive COVID-19 test.  He was tested positive for COVID in May of this year, he had negative test in between.  Treated with Zosyn as aspiration pneumonia as well as remdesivir. Started on tube feeding on 10/29 with persistent inability to eat.   Assessment & Plan:   Principal Problem:   Pneumonia Active Problems:   Dyslipidemia   Essential hypertension   Dementia (HCC)   Seizure (HCC)   COVID  Bilateral lower lobe pneumonia, likely aspiration pneumonia.  Still symptomatic. Continue IV Zosyn until clinically improved.  Repeat chest x-ray today with worsening infiltrates, suspect he keeps aspirating. Continue chest physiotherapy, incentive spirometry, deep breathing exercises, sputum induction, mucolytic's and bronchodilators.  He is not able to participate much in respiratory therapy. Cultures negative so far. Supplemental oxygen to keep saturations more than 90%. On room air today. Failed speech swallow eval, some improvement of mentation. Keep NPO until repeat Swallow. NG tube inserted and started on tube feeding.   Insert core track today as he is not ready to eat. CT scan of the head today to rule out any new stroke or TIA.  COVID-19 infection: Likely causing debilitating condition and aggravating his bacterial pneumonia. Supplemental oxygen to keep saturations more than 90%.  Chest physiotherapy as above. Covid directed therapy with , remdesivir, day 5/5.  Lower extremity duplex is negative.  D-dimer is normalizing.  History of seizure on Depakote and Keppra: Now on seizure  medications through NG tube.   Also started on his dementia medications. unlikely new seizures.  Patient probably has advancing dementia.  History of dementia: Lives in long-term nursing home.  Continue to work with PT OT.  Addendum: Called and discussed with patient's son [name] [name] on the phone.  Updated him about patient's inability to maintain secretions.  Not ready to eat.  We decided to continue to treat him.  We will change to core track feeding.  He is agreeable to discussed with palliative care team about goal of care and further planning.  We will consult palliative care team.   DVT prophylaxis: enoxaparin (LOVENOX) injection 40 mg Start: 10/21/21 1000   Code Status: Full code Family Communication: Son on the phone. Disposition Plan: Status is: Inpatient  Remains inpatient appropriate because: persistent lethargy, IV antibiotics.  Consultants:  None   Procedures:  None   Antimicrobials:  Anti-infectives (From admission, onward)    Start     Dose/Rate Route Frequency Ordered Stop   10/22/21 1000  remdesivir 100 mg in sodium chloride 0.9 % 100 mL IVPB       See Hyperspace for full Linked Orders Report.   100 mg 200 mL/hr over 30 Minutes Intravenous Daily 10/21/21 0238 10/25/21 1030   10/21/21 0800  piperacillin-tazobactam (ZOSYN) IVPB 3.375 g        3.375 g 12.5 mL/hr over 240 Minutes Intravenous Every 8 hours 10/21/21 0240     10/21/21 0300  remdesivir 200 mg in sodium chloride 0.9% 250 mL IVPB       See Hyperspace for full Linked Orders Report.   200 mg 580 mL/hr over  30 Minutes Intravenous Once 10/21/21 0238 10/21/21 0536   10/20/21 2200  piperacillin-tazobactam (ZOSYN) IVPB 3.375 g        3.375 g 100 mL/hr over 30 Minutes Intravenous  Once 10/20/21 2150 10/21/21 0303          Subjective: Patient seen and examined.  Remains about the same.  Responds to his name.  Complains of pain on mobilizing both legs.  Gets irritated.  Still with a lot of upper airway  secretions. Chest x-ray with persistent bilateral infiltrates.   Objective: Vitals:   10/24/21 2016 10/25/21 0430 10/25/21 0433 10/25/21 0901  BP: (!) 149/88 (!) 154/91  (!) 154/77  Pulse: 73 80  83  Resp: 18 20  20   Temp: 98.2 F (36.8 C) 98.5 F (36.9 C)  98.1 F (36.7 C)  TempSrc: Axillary Axillary  Oral  SpO2: 100% 97%  98%  Weight:   81.5 kg   Height:        Intake/Output Summary (Last 24 hours) at 10/25/2021 1048 Last data filed at 10/25/2021 0655 Gross per 24 hour  Intake 1055.02 ml  Output 500 ml  Net 555.02 ml   Filed Weights   10/21/21 0230 10/24/21 0500 10/25/21 0433  Weight: 72.6 kg 79.6 kg 81.5 kg    Examination:  General exam: Calm and quiet.  Mostly sleepy.  Very frail and debilitated. Alert to voice.  Not oriented to time and place. Respiratory system: Conducted upper airway sounds. Cardiovascular system: S1 & S2 heard, RRR.  Gastrointestinal system: Abdomen is nondistended, soft and nontender. No organomegaly or masses felt. Normal bowel sounds heard. NG tube infusing. Central nervous system: Sleepy.  But awake to conversation. He has 1+ edema all extremities, LE both tender on dorsum of feet.    Data Reviewed: I have personally reviewed following labs and imaging studies  CBC: Recent Labs  Lab 10/20/21 2029 10/21/21 0229 10/22/21 0253 10/23/21 0757 10/24/21 0116  WBC 4.7 5.6 5.2 5.5 5.1  NEUTROABS 3.5 3.7 3.2 3.3 2.9  HGB 11.1* 12.4* 12.6* 13.2 11.1*  HCT 35.7* 38.9* 40.7 42.0 35.9*  MCV 87.7 84.7 87.9 87.1 87.3  PLT 217 248 227 195 177   Basic Metabolic Panel: Recent Labs  Lab 10/21/21 0229 10/22/21 0253 10/23/21 0757 10/24/21 0116 10/25/21 0500  NA 141 143 145 145 142  K 4.0 4.0 4.0 3.5 3.6  CL 105 104 107 110 106  CO2 29 31 30 30 29   GLUCOSE 107* 94 121* 119* 139*  BUN 24* 24* 20 17 16   CREATININE 1.11 1.10 0.97 0.88 0.87  CALCIUM 8.9 8.8* 9.0 8.4* 8.6*   GFR: Estimated Creatinine Clearance: 71.4 mL/min (by C-G  formula based on SCr of 0.87 mg/dL). Liver Function Tests: Recent Labs  Lab 10/21/21 0229 10/22/21 0253 10/23/21 0757 10/24/21 0116 10/25/21 0500  AST 26 48* 40 30 28  ALT 32 33 30 25 25   ALKPHOS 73 84 72 71 73  BILITOT 0.4 0.6 0.8 0.8 0.5  PROT 5.9* 6.0* 5.9* 5.2* 5.6*  ALBUMIN 2.2* 2.2* 2.3* 2.0* 2.0*   No results for input(s): LIPASE, AMYLASE in the last 168 hours. No results for input(s): AMMONIA in the last 168 hours. Coagulation Profile: Recent Labs  Lab 10/20/21 2029  INR 1.3*   Cardiac Enzymes: No results for input(s): CKTOTAL, CKMB, CKMBINDEX, TROPONINI in the last 168 hours. BNP (last 3 results) No results for input(s): PROBNP in the last 8760 hours. HbA1C: No results for input(s): HGBA1C in the last  72 hours. CBG: Recent Labs  Lab 10/24/21 1620 10/24/21 1906 10/24/21 2255 10/25/21 0320 10/25/21 0859  GLUCAP 120* 112* 123* 137* 134*   Lipid Profile: No results for input(s): CHOL, HDL, LDLCALC, TRIG, CHOLHDL, LDLDIRECT in the last 72 hours. Thyroid Function Tests: No results for input(s): TSH, T4TOTAL, FREET4, T3FREE, THYROIDAB in the last 72 hours. Anemia Panel: No results for input(s): VITAMINB12, FOLATE, FERRITIN, TIBC, IRON, RETICCTPCT in the last 72 hours. Sepsis Labs: Recent Labs  Lab 10/20/21 2029  LATICACIDVEN 1.4    Recent Results (from the past 240 hour(s))  Blood culture (routine single)     Status: None   Collection Time: 10/20/21  8:29 PM   Specimen: BLOOD  Result Value Ref Range Status   Specimen Description BLOOD SITE NOT SPECIFIED  Final   Special Requests   Final    BOTTLES DRAWN AEROBIC AND ANAEROBIC Blood Culture results may not be optimal due to an inadequate volume of blood received in culture bottles   Culture   Final    NO GROWTH 5 DAYS Performed at [facility], [address] [place], [place] [address]    Report Status 10/25/2021 FINAL  Final  Urine Culture     Status: Abnormal   Collection Time: 10/20/21   8:29 PM   Specimen: In/Out Cath Urine  Result Value Ref Range Status   Specimen Description IN/OUT CATH URINE  Final   Special Requests   Final    NONE Performed at [facility], [address] [place], [place] [address]    Culture MULTIPLE SPECIES PRESENT, SUGGEST RECOLLECTION (A)  Final   Report Status 10/21/2021 FINAL  Final  Resp Panel by RT-PCR (Flu A&B, Covid) Nasopharyngeal Swab     Status: Abnormal   Collection Time: 10/20/21  8:31 PM   Specimen: Nasopharyngeal Swab; Nasopharyngeal(NP) swabs in vial transport medium  Result Value Ref Range Status   SARS Coronavirus 2 by RT PCR POSITIVE (A) NEGATIVE Final    Comment: RESULT CALLED TO, READ BACK BY AND VERIFIED WITH: S NOLTE,[id number]  10/20/21 MK (NOTE) SARS-CoV-2 target nucleic acids are DETECTED.  The SARS-CoV-2 RNA is generally detectable in upper respiratory specimens during the acute phase of infection. Positive results are indicative of the presence of the identified virus, but do not rule out bacterial infection or co-infection with other pathogens not detected by the test. Clinical correlation with patient history and other diagnostic information is necessary to determine patient infection status. The expected result is Negative.  Fact Sheet for Patients: BloggerCourse.com  Fact Sheet for Healthcare Providers: SeriousBroker.it  This test is not yet approved or cleared by the [place] FDA and  has been authorized for detection and/or diagnosis of SARS-CoV-2 by FDA under an Emergency Use Authorization (EUA).  This EUA will remain in effect (meaning this test can be used)  for the duration of  the COVID-19 declaration under Section 564(b)(1) of the Act, 21 U.S.C. section 360bbb-3(b)(1), unless the authorization is terminated or revoked sooner.     Influenza A by PCR NEGATIVE NEGATIVE Final   Influenza B by PCR NEGATIVE NEGATIVE Final    Comment:  (NOTE) The Xpert Xpress SARS-CoV-2/FLU/RSV plus assay is intended as an aid in the diagnosis of influenza from Nasopharyngeal swab specimens and should not be used as a sole basis for treatment. Nasal washings and aspirates are unacceptable for Xpert Xpress SARS-CoV-2/FLU/RSV testing.  Fact Sheet for Patients: BloggerCourse.com  Fact Sheet for Healthcare Providers: SeriousBroker.it  This  test is not yet approved or cleared by the [place] and has been authorized for detection and/or diagnosis of SARS-CoV-2 by FDA under an Emergency Use Authorization (EUA). This EUA will remain in effect (meaning this test can be used) for the duration of the COVID-19 declaration under Section 564(b)(1) of the Act, 21 U.S.C. section 360bbb-3(b)(1), unless the authorization is terminated or revoked.  Performed at [facility], [address] [place], [place] [address]          Radiology Studies: DG Abd 1 View  Result Date: 10/23/2021 CLINICAL DATA:  Nasogastric tube placement. EXAM: ABDOMEN - 1 VIEW COMPARISON:  August 24, 2014. FINDINGS: Nasogastric tube tip is seen in the expected position of the stomach. No abnormal bowel dilatation is noted. Residual contrast is noted in the colon. IMPRESSION: Nasogastric tube tip seen in expected position of the stomach. No abnormal bowel dilatation is noted. Electronically Signed   By: Lupita Raider M.D.   On: 10/23/2021 14:29   DG CHEST PORT 1 VIEW  Result Date: 10/25/2021 CLINICAL DATA:  Pneumonia EXAM: PORTABLE CHEST 1 VIEW COMPARISON:  Previous studies including the examination of 10/20/2021 FINDINGS: Transverse diameter of heart is enlarged. Enteric tube is noted in place. There is homogeneous opacity in the left lower lung field obscuring the left hemidiaphragm which may be due to pleural effusion and underlying atelectasis/pneumonia. There are patchy infiltrates in left parahilar  region, right parahilar region and right lower lung fields mid some interval worsening. There is blunting of both lateral CP angles. There is no pneumothorax. IMPRESSION: Cardiomegaly. Homogeneous opacity in left lower lung fields suggests pleural effusion and underlying atelectasis/pneumonia. There are patchy infiltrates in both parahilar regions and right lower lung fields with interval worsening Electronically Signed   By: Ernie Avena M.D.   On: 10/25/2021 10:22   VAS [place] LOWER EXTREMITY VENOUS (DVT)  Result Date: 10/24/2021  Lower Venous DVT Study Patient Name:  KEWON STATLER  Date of Exam:   10/23/2021 Medical Rec #: [id number]         Accession #:    [id number] Date of Birth: 18-Dec-1935         Patient Gender: M Patient Age:   68 years Exam Location:  [facility] Procedure:      VAS [place] LOWER EXTREMITY VENOUS (DVT) Referring Phys: Dorcas Carrow --------------------------------------------------------------------------------  Indications: Swelling, and Covid.  Limitations: Patient would not allow tech to reposition him. Comparison Study: Prior negative LEV done 05/08/21 Performing Technologist: Jean Rosenthal RDMS, RVT  Examination Guidelines: A complete evaluation includes B-mode imaging, spectral Doppler, color Doppler, and power Doppler as needed of all accessible portions of each vessel. Bilateral testing is considered an integral part of a complete examination. Limited examinations for reoccurring indications may be performed as noted. The reflux portion of the exam is performed with the patient in reverse Trendelenburg.  +---------+---------------+---------+-----------+----------+-------------------+ RIGHT    CompressibilityPhasicitySpontaneityPropertiesThrombus Aging      +---------+---------------+---------+-----------+----------+-------------------+ CFV      Full                                                              +---------+---------------+---------+-----------+----------+-------------------+ SFJ      Full                                                             +---------+---------------+---------+-----------+----------+-------------------+  FV Prox  Full                                                             +---------+---------------+---------+-----------+----------+-------------------+ FV Mid   Full                                                             +---------+---------------+---------+-----------+----------+-------------------+ FV DistalFull                                                             +---------+---------------+---------+-----------+----------+-------------------+ PFV      Full                                                             +---------+---------------+---------+-----------+----------+-------------------+ POP      Full                                                             +---------+---------------+---------+-----------+----------+-------------------+ PTV                                                   Not well visualized +---------+---------------+---------+-----------+----------+-------------------+ PERO     Full                                                             +---------+---------------+---------+-----------+----------+-------------------+   +---------+---------------+---------+-----------+----------+--------------+ LEFT     CompressibilityPhasicitySpontaneityPropertiesThrombus Aging +---------+---------------+---------+-----------+----------+--------------+ CFV      Full                                                        +---------+---------------+---------+-----------+----------+--------------+ SFJ      Full                                                        +---------+---------------+---------+-----------+----------+--------------+ FV Prox  Full                                                         +---------+---------------+---------+-----------+----------+--------------+  FV Mid   Full                                                        +---------+---------------+---------+-----------+----------+--------------+ FV DistalFull                                                        +---------+---------------+---------+-----------+----------+--------------+ PFV      Full                                                        +---------+---------------+---------+-----------+----------+--------------+ POP      Full                                                        +---------+---------------+---------+-----------+----------+--------------+ PTV      Full                                                        +---------+---------------+---------+-----------+----------+--------------+ PERO     Full                                                        +---------+---------------+---------+-----------+----------+--------------+     Summary: RIGHT: - There is no evidence of deep vein thrombosis in the lower extremity. However, portions of this examination were limited- see technologist comments above.  pulsatile waveforms noted throughout  LEFT: - There is no evidence of deep vein thrombosis in the lower extremity.  - A cystic structure is found in the popliteal fossa. Pulsatile waveforms noted throughout.  *See table(s) above for measurements and observations. Electronically signed by Heath Lark on 10/24/2021 at 9:30:10 AM.    Final         Scheduled Meds:  brinzolamide  1 drop Both Eyes TID   chlorhexidine  15 mL Mouth Rinse BID   donepezil  10 mg Per Tube QHS   enoxaparin (LOVENOX) injection  40 mg Subcutaneous Q24H   latanoprost  1 drop Both Eyes QHS   levETIRAcetam  500 mg Oral BID   mouth rinse  15 mL Mouth Rinse q12n4p   memantine  10 mg Per Tube BID   risperiDONE  0.5 mg Per Tube QHS   valproic acid  500 mg  Per Tube BID   Continuous Infusions:  sodium chloride Stopped (10/22/21 1658)   feeding supplement (JEVITY 1.2 CAL) 60 mL (10/24/21 2018)   piperacillin-tazobactam (ZOSYN)  IV 3.375 g (10/25/21 0930)     LOS: 5 days  Time spent: 30 minutes    Barb Merino, MD [facility] Pager [phone]

## 2021-10-26 DIAGNOSIS — R569 Unspecified convulsions: Secondary | ICD-10-CM | POA: Diagnosis not present

## 2021-10-26 DIAGNOSIS — I10 Essential (primary) hypertension: Secondary | ICD-10-CM | POA: Diagnosis not present

## 2021-10-26 DIAGNOSIS — J189 Pneumonia, unspecified organism: Secondary | ICD-10-CM | POA: Diagnosis not present

## 2021-10-26 LAB — COMPREHENSIVE METABOLIC PANEL
ALT: 20 U/L (ref 0–44)
AST: 27 U/L (ref 15–41)
Albumin: 1.7 g/dL — ABNORMAL LOW (ref 3.5–5.0)
Alkaline Phosphatase: 57 U/L (ref 38–126)
Anion gap: 7 (ref 5–15)
BUN: 21 mg/dL (ref 8–23)
CO2: 25 mmol/L (ref 22–32)
Calcium: 8.1 mg/dL — ABNORMAL LOW (ref 8.9–10.3)
Chloride: 109 mmol/L (ref 98–111)
Creatinine, Ser: 0.86 mg/dL (ref 0.61–1.24)
GFR, Estimated: >60 mL/min (ref >60)
Glucose, Bld: 134 mg/dL — ABNORMAL HIGH (ref 70–99)
Potassium: 4.3 mmol/L (ref 3.5–5.1)
Sodium: 141 mmol/L (ref 135–145)
Total Bilirubin: 0.5 mg/dL (ref 0.3–1.2)
Total Protein: 4.6 g/dL — ABNORMAL LOW (ref 6.5–8.1)

## 2021-10-26 LAB — CBC WITH DIFFERENTIAL/PLATELET
Abs Immature Granulocytes: 0.31 10*3/uL — ABNORMAL HIGH (ref 0.00–0.07)
Basophils Absolute: 0.1 10*3/uL (ref 0.0–0.1)
Basophils Relative: 1 %
Eosinophils Absolute: 0.2 10*3/uL (ref 0.0–0.5)
Eosinophils Relative: 3 %
HCT: 34.9 % — ABNORMAL LOW (ref 39.0–52.0)
Hemoglobin: 10.8 g/dL — ABNORMAL LOW (ref 13.0–17.0)
Immature Granulocytes: 4 %
Lymphocytes Relative: 10 %
Lymphs Abs: 0.8 10*3/uL (ref 0.7–4.0)
MCH: 27.3 pg (ref 26.0–34.0)
MCHC: 30.9 g/dL (ref 30.0–36.0)
MCV: 88.4 fL (ref 80.0–100.0)
Monocytes Absolute: 1.5 10*3/uL — ABNORMAL HIGH (ref 0.1–1.0)
Monocytes Relative: 20 %
Neutro Abs: 4.9 10*3/uL (ref 1.7–7.7)
Neutrophils Relative %: 62 %
Platelets: 224 10*3/uL (ref 150–400)
RBC: 3.95 MIL/uL — ABNORMAL LOW (ref 4.22–5.81)
RDW: 13.8 % (ref 11.5–15.5)
WBC: 7.7 10*3/uL (ref 4.0–10.5)
nRBC: 0 % (ref 0.0–0.2)

## 2021-10-26 LAB — GLUCOSE, CAPILLARY
Glucose-Capillary: 117 mg/dL — ABNORMAL HIGH (ref 70–99)
Glucose-Capillary: 120 mg/dL — ABNORMAL HIGH (ref 70–99)
Glucose-Capillary: 132 mg/dL — ABNORMAL HIGH (ref 70–99)
Glucose-Capillary: 126 mg/dL — ABNORMAL HIGH (ref 70–99)
Glucose-Capillary: 122 mg/dL — ABNORMAL HIGH (ref 70–99)
Glucose-Capillary: 118 mg/dL — ABNORMAL HIGH (ref 70–99)

## 2021-10-26 NOTE — Plan of Care (Signed)
  Problem: Elimination: Goal: Will not experience complications related to bowel motility Outcome: Progressing Goal: Will not experience complications related to urinary retention Outcome: Progressing   Problem: Pain Managment: Goal: General experience of comfort will improve Outcome: Progressing   

## 2021-10-26 NOTE — Plan of Care (Signed)
  Problem: Clinical Measurements: Goal: Will remain free from infection Outcome: Progressing Goal: Respiratory complications will improve Outcome: Progressing Goal: Cardiovascular complication will be avoided Outcome: Progressing   

## 2021-10-26 NOTE — Progress Notes (Signed)
PROGRESS NOTE    [name] [name]  HDQ:[id number] DOB: 1935/07/15 DOA: 10/20/2021 PCP: Karna Dupes, MD    Brief Narrative:  86 year old with dementia, seizure disorder, BPH, GI bleeding brought to the emergency room with sudden onset of shortness of breath and rhonchi noted at nursing home.  He is a long-term nursing home resident.  Found to have bilateral lower lobe pneumonia as well as positive COVID-19 test.  He was tested positive for COVID in May of this year, he had negative test in between.  Treated with Zosyn as aspiration pneumonia as well as remdesivir. Started on tube feeding on 10/29 with persistent inability to eat. Remains persistently debilitated and not safe to swallow.    Assessment & Plan:   Principal Problem:   Pneumonia Active Problems:   Dyslipidemia   Essential hypertension   Dementia (HCC)   Seizure (HCC)   COVID  Bilateral lower lobe pneumonia, likely aspiration pneumonia.  Still symptomatic. Continue IV Zosyn until clinically improved.  Repeat chest x-ray 10/31 with worsening infiltrates, suspect he keeps aspirating. Continue chest physiotherapy, incentive spirometry, deep breathing exercises, sputum induction, mucolytic's and bronchodilators.  He is not able to participate much in respiratory therapy. Cultures negative so far. Supplemental oxygen to keep saturations more than 90%. On room air today. Failed speech swallow eval, some improvement of mentation. Keep NPO until repeat Swallow. Currently getting nutrition through the core track tube. CT head 10/31 essentially normal.   COVID-19 infection: Likely causing debilitating condition and aggravating his bacterial pneumonia. Supplemental oxygen to keep saturations more than 90%.  Chest physiotherapy as above.  Completed COVID directed therapy.  Inflammatory markers improved.  D-dimer is trending down.  Lower extremity duplex is negative.    History of seizure on Depakote and Keppra: Now on  seizure medications through NG tube.   Also started on his dementia medications. unlikely new seizures.  Patient probably has advancing dementia.  History of dementia: Lives in long-term nursing home.  Continue to work with PT OT.  Goal of care: Updated patient and son every day on the phone. Updated him about patient's inability to maintain secretions.  Not ready to eat.  We decided to continue to treat him.   Is agreeable to continue to discuss with palliative care team about goal of care. I have explained to him that if he is not able to safely eat in next few days, it may be because of his advanced dementia.    DVT prophylaxis: enoxaparin (LOVENOX) injection 40 mg Start: 10/21/21 1000   Code Status: Full code Family Communication: Son on the phone. Disposition Plan: Status is: Inpatient  Remains inpatient appropriate because: persistent lethargy, IV antibiotics.  Consultants:  None   Procedures:  None   Antimicrobials:  Anti-infectives (From admission, onward)    Start     Dose/Rate Route Frequency Ordered Stop   10/22/21 1000  remdesivir 100 mg in sodium chloride 0.9 % 100 mL IVPB       See Hyperspace for full Linked Orders Report.   100 mg 200 mL/hr over 30 Minutes Intravenous Daily 10/21/21 0238 10/25/21 1030   10/21/21 0800  piperacillin-tazobactam (ZOSYN) IVPB 3.375 g        3.375 g 12.5 mL/hr over 240 Minutes Intravenous Every 8 hours 10/21/21 0240     10/21/21 0300  remdesivir 200 mg in sodium chloride 0.9% 250 mL IVPB       See Hyperspace for full Linked Orders Report.   200 mg 580  mL/hr over 30 Minutes Intravenous Once 10/21/21 0238 10/21/21 0536   10/20/21 2200  piperacillin-tazobactam (ZOSYN) IVPB 3.375 g        3.375 g 100 mL/hr over 30 Minutes Intravenous  Once 10/20/21 2150 10/21/21 0303          Subjective: Patient seen and examined.  Afebrile.  No change in status.  Keeps his Eyes closed, responds to some basic questions.  Answers his name and  tells me he is doing fine.    Objective: Vitals:   10/25/21 0433 10/25/21 0901 10/25/21 2100 10/26/21 1002  BP:  (!) 154/77 124/70 (!) 150/69  Pulse:  83 70 74  Resp:  20  19  Temp:  98.1 F (36.7 C) 98.6 F (37 C) 100 F (37.8 C)  TempSrc:  Oral Oral Axillary  SpO2:  98% 100% 93%  Weight: 81.5 kg     Height:        Intake/Output Summary (Last 24 hours) at 10/26/2021 1138 Last data filed at 10/25/2021 1757 Gross per 24 hour  Intake 903.85 ml  Output 250 ml  Net 653.85 ml   Filed Weights   10/21/21 0230 10/24/21 0500 10/25/21 0433  Weight: 72.6 kg 79.6 kg 81.5 kg    Examination:  General exam: Calm and quiet.   Very frail and debilitated. Alert to voice.  Not oriented to time and place. Respiratory system: Conducted upper airway sounds.  On room air today. Cardiovascular system: S1 & S2 heard, RRR.  Gastrointestinal system: Abdomen is nondistended, soft and nontender. No organomegaly or masses felt. Normal bowel sounds heard. NG tube infusing. Central nervous system: Sleepy.  But awake to conversation. He has 1+ edema all extremities, LE both tender on dorsum of feet.    Data Reviewed: I have personally reviewed following labs and imaging studies  CBC: Recent Labs  Lab 10/21/21 0229 10/22/21 0253 10/23/21 0757 10/24/21 0116 10/26/21 0327  WBC 5.6 5.2 5.5 5.1 7.7  NEUTROABS 3.7 3.2 3.3 2.9 4.9  HGB 12.4* 12.6* 13.2 11.1* 10.8*  HCT 38.9* 40.7 42.0 35.9* 34.9*  MCV 84.7 87.9 87.1 87.3 88.4  PLT 248 227 195 177 224   Basic Metabolic Panel: Recent Labs  Lab 10/22/21 0253 10/23/21 0757 10/24/21 0116 10/25/21 0500 10/26/21 0327  NA 143 145 145 142 141  K 4.0 4.0 3.5 3.6 4.3  CL 104 107 110 106 109  CO2 31 30 30 29 25   GLUCOSE 94 121* 119* 139* 134*  BUN 24* 20 17 16 21   CREATININE 1.10 0.97 0.88 0.87 0.86  CALCIUM 8.8* 9.0 8.4* 8.6* 8.1*   GFR: Estimated Creatinine Clearance: 72.3 mL/min (by C-G formula based on SCr of 0.86 mg/dL). Liver  Function Tests: Recent Labs  Lab 10/22/21 0253 10/23/21 0757 10/24/21 0116 10/25/21 0500 10/26/21 0327  AST 48* 40 30 28 27   ALT 33 30 25 25 20   ALKPHOS 84 72 71 73 57  BILITOT 0.6 0.8 0.8 0.5 0.5  PROT 6.0* 5.9* 5.2* 5.6* 4.6*  ALBUMIN 2.2* 2.3* 2.0* 2.0* 1.7*   No results for input(s): LIPASE, AMYLASE in the last 168 hours. No results for input(s): AMMONIA in the last 168 hours. Coagulation Profile: Recent Labs  Lab 10/20/21 2029  INR 1.3*   Cardiac Enzymes: No results for input(s): CKTOTAL, CKMB, CKMBINDEX, TROPONINI in the last 168 hours. BNP (last 3 results) No results for input(s): PROBNP in the last 8760 hours. HbA1C: No results for input(s): HGBA1C in the last 72 hours. CBG:  Recent Labs  Lab 10/25/21 1244 10/25/21 2003 10/25/21 2339 10/26/21 0338 10/26/21 0759  GLUCAP 156* 149* 140* 117* 120*   Lipid Profile: No results for input(s): CHOL, HDL, LDLCALC, TRIG, CHOLHDL, LDLDIRECT in the last 72 hours. Thyroid Function Tests: No results for input(s): TSH, T4TOTAL, FREET4, T3FREE, THYROIDAB in the last 72 hours. Anemia Panel: No results for input(s): VITAMINB12, FOLATE, FERRITIN, TIBC, IRON, RETICCTPCT in the last 72 hours. Sepsis Labs: Recent Labs  Lab 10/20/21 2029  LATICACIDVEN 1.4    Recent Results (from the past 240 hour(s))  Blood culture (routine single)     Status: None   Collection Time: 10/20/21  8:29 PM   Specimen: BLOOD  Result Value Ref Range Status   Specimen Description BLOOD SITE NOT SPECIFIED  Final   Special Requests   Final    BOTTLES DRAWN AEROBIC AND ANAEROBIC Blood Culture results may not be optimal due to an inadequate volume of blood received in culture bottles   Culture   Final    NO GROWTH 5 DAYS Performed at [facility], [address] [place], [place] [address]    Report Status 10/25/2021 FINAL  Final  Urine Culture     Status: Abnormal   Collection Time: 10/20/21  8:29 PM   Specimen: In/Out Cath Urine  Result  Value Ref Range Status   Specimen Description IN/OUT CATH URINE  Final   Special Requests   Final    NONE Performed at [facility], [address], [place], [place] [address]    Culture MULTIPLE SPECIES PRESENT, SUGGEST RECOLLECTION (A)  Final   Report Status 10/21/2021 FINAL  Final  Resp Panel by RT-PCR (Flu A&B, Covid) Nasopharyngeal Swab     Status: Abnormal   Collection Time: 10/20/21  8:31 PM   Specimen: Nasopharyngeal Swab; Nasopharyngeal(NP) swabs in vial transport medium  Result Value Ref Range Status   SARS Coronavirus 2 by RT PCR POSITIVE (A) NEGATIVE Final    Comment: RESULT CALLED TO, READ BACK BY AND VERIFIED WITH: S NOLTE,RN@[id number]  10/20/21 MK (NOTE) SARS-CoV-2 target nucleic acids are DETECTED.  The SARS-CoV-2 RNA is generally detectable in upper respiratory specimens during the acute phase of infection. Positive results are indicative of the presence of the identified virus, but do not rule out bacterial infection or co-infection with other pathogens not detected by the test. Clinical correlation with patient history and other diagnostic information is necessary to determine patient infection status. The expected result is Negative.  Fact Sheet for Patients: BloggerCourse.com  Fact Sheet for Healthcare Providers: SeriousBroker.it  This test is not yet approved or cleared by the [place] FDA and  has been authorized for detection and/or diagnosis of SARS-CoV-2 by FDA under an Emergency Use Authorization (EUA).  This EUA will remain in effect (meaning this test can be used)  for the duration of  the COVID-19 declaration under Section 564(b)(1) of the Act, 21 U.S.C. section 360bbb-3(b)(1), unless the authorization is terminated or revoked sooner.     Influenza A by PCR NEGATIVE NEGATIVE Final   Influenza B by PCR NEGATIVE NEGATIVE Final    Comment: (NOTE) The Xpert Xpress SARS-CoV-2/FLU/RSV plus assay  is intended as an aid in the diagnosis of influenza from Nasopharyngeal swab specimens and should not be used as a sole basis for treatment. Nasal washings and aspirates are unacceptable for Xpert Xpress SARS-CoV-2/FLU/RSV testing.  Fact Sheet for Patients: BloggerCourse.com  Fact Sheet for Healthcare Providers: SeriousBroker.it  This test is not  yet approved or cleared by the [place] and has been authorized for detection and/or diagnosis of SARS-CoV-2 by FDA under an Emergency Use Authorization (EUA). This EUA will remain in effect (meaning this test can be used) for the duration of the COVID-19 declaration under Section 564(b)(1) of the Act, 21 U.S.C. section 360bbb-3(b)(1), unless the authorization is terminated or revoked.  Performed at [facility], [address] [place], [place] [address]   MRSA Next Gen by PCR, Nasal     Status: Abnormal   Collection Time: 10/25/21 12:34 PM   Specimen: Nasal Mucosa; Nasal Swab  Result Value Ref Range Status   MRSA by PCR Next Gen DETECTED (A) NOT DETECTED Final    Comment: RESULT CALLED TO, READ BACK BY AND VERIFIED WITH: Ulyses Southward RN [id number] 10/25/21 A BROWNING (NOTE) The GeneXpert MRSA Assay (FDA approved for NASAL specimens only), is one component of a comprehensive MRSA colonization surveillance program. It is not intended to diagnose MRSA infection nor to guide or monitor treatment for MRSA infections. Test performance is not FDA approved in patients less than 15 years old. Performed at [facility], [address], [place], [place] [address]          Radiology Studies: CT HEAD WO CONTRAST ( )  Result Date: 10/25/2021 CLINICAL DATA:  Altered mental status. EXAM: CT HEAD WITHOUT CONTRAST TECHNIQUE: Contiguous axial images were obtained from the base of the skull through the vertex without intravenous contrast. COMPARISON:  CT head 05/07/2021 FINDINGS:  Brain: No evidence of acute infarction, hemorrhage, hydrocephalus, extra-axial collection or mass lesion/mass effect. Advanced cortical volume loss. Patchy hypodensities in the periventricular and subcortical white matter, likely secondary to chronic microvascular ischemic changes. Vascular: Calcified plaques in the carotid siphons. Skull: Normal. Negative for fracture or focal lesion. Sinuses/Orbits: Mild-to-moderate mucosal thickening in the paranasal sinuses. Other: None. IMPRESSION: Chronic changes with no acute intracranial process identified. Electronically Signed   By: Jannifer Hick M.D.   On: 10/25/2021 13:25   DG CHEST PORT 1 VIEW  Result Date: 10/25/2021 CLINICAL DATA:  Pneumonia EXAM: PORTABLE CHEST 1 VIEW COMPARISON:  Previous studies including the examination of 10/20/2021 FINDINGS: Transverse diameter of heart is enlarged. Enteric tube is noted in place. There is homogeneous opacity in the left lower lung field obscuring the left hemidiaphragm which may be due to pleural effusion and underlying atelectasis/pneumonia. There are patchy infiltrates in left parahilar region, right parahilar region and right lower lung fields mid some interval worsening. There is blunting of both lateral CP angles. There is no pneumothorax. IMPRESSION: Cardiomegaly. Homogeneous opacity in left lower lung fields suggests pleural effusion and underlying atelectasis/pneumonia. There are patchy infiltrates in both parahilar regions and right lower lung fields with interval worsening Electronically Signed   By: Ernie Avena M.D.   On: 10/25/2021 10:22   DG Abd Portable 1V  Result Date: 10/25/2021 CLINICAL DATA:  Feeding tube placement. EXAM: PORTABLE ABDOMEN - 1 VIEW COMPARISON:  October 23, 2021. FINDINGS: The bowel gas pattern is normal. Residual contrast is seen in the colon. Distal tip of feeding tube is seen in expected position of distal stomach. No radio-opaque calculi or other significant  radiographic abnormality are seen. IMPRESSION: Distal tip of feeding tube seen in expected position of distal stomach. Electronically Signed   By: Lupita Raider M.D.   On: 10/25/2021 17:29        Scheduled Meds:  brinzolamide  1 drop Both Eyes TID  chlorhexidine  15 mL Mouth Rinse BID   Chlorhexidine Gluconate Cloth  6 each Topical Q0600   donepezil  10 mg Per Tube QHS   enoxaparin (LOVENOX) injection  40 mg Subcutaneous Q24H   latanoprost  1 drop Both Eyes QHS   levETIRAcetam  500 mg Oral BID   mouth rinse  15 mL Mouth Rinse q12n4p   memantine  10 mg Per Tube BID   mupirocin ointment  1 application Nasal BID   risperiDONE  0.5 mg Per Tube QHS   valproic acid  500 mg Per Tube BID   Continuous Infusions:  sodium chloride Stopped (10/22/21 1658)   feeding supplement (JEVITY 1.2 CAL) 1,000 mL (10/26/21 1027)   piperacillin-tazobactam (ZOSYN)  IV 3.375 g (10/26/21 1007)     LOS: 6 days    Time spent: 30 minutes    Dorcas Carrow, MD [facility] Pager [phone]

## 2021-10-26 NOTE — Progress Notes (Signed)
Physical Therapy Treatment Patient Details Name: [name] [name] MRN: [id number] DOB: 08/28/1935 Today's Date: 10/26/2021   History of Present Illness 86 y/o male admitted 10/26 in respiratory distress with rhonchi and coughing, concern for aspiration.  Pt was initially requiring 2L O2 to maintain sats.  Covid check on admission came back positive.  PMHx:  dementia, glaucoma, HTN,    PT Comments    Pt resting upon arrival to room, wakes with PT calling pt's name. Pt is resistive and anxious with mobility, but once over transition period (moving supine>sit) pt settles. Pt with poor sitting balance, unable to progress mobility beyond EOB given pt anxiety and distress with mobility. PT to continue to follow acutely. Pending pt progress, may need more long-term care vs ST-SNF for rehab.     Recommendations for follow up therapy are one component of a multi-disciplinary discharge planning process, led by the attending physician.  Recommendations may be updated based on patient status, additional functional criteria and insurance authorization.  Follow Up Recommendations  Skilled nursing-short term rehab (<3 hours/day)     Assistance Recommended at Discharge Frequent or constant Supervision/Assistance  Equipment Recommendations  None recommended by PT    Recommendations for Other Services       Precautions / Restrictions Precautions Precautions: Fall Precaution Comments: bilat handmitts Restrictions Weight Bearing Restrictions: No     Mobility  Bed Mobility Overal bed mobility: Needs Assistance Bed Mobility: Supine to Sit;Sit to Supine     Supine to sit: +2 for physical assistance;+2 for safety/equipment;Total assist Sit to supine: +2 for physical assistance;+2 for safety/equipment;Total assist   General bed mobility comments: total +2 for all aspects, helicopter method with bed pad to bring pt to and from EOB. EOB sitting x8 minutes with cues for coughing given wet, gurgling  breath sounds. x1 productive cough.    Transfers                   General transfer comment: deferred due to pt distress with mobility    Ambulation/Gait                 Stairs             Wheelchair Mobility    Modified Rankin (Stroke Patients Only)       Balance Overall balance assessment: Needs assistance Sitting-balance support: Bilateral upper extremity supported;Feet supported Sitting balance-Leahy Scale: Poor Sitting balance - Comments: heavy posterior leaning requiring posterior support                                    Cognition Arousal/Alertness: Awake/alert Behavior During Therapy: Flat affect Overall Cognitive Status: History of cognitive impairments - at baseline                                 General Comments: history of dementia, startles easily and responds "no!!" to most things        Exercises      General Comments General comments (skin integrity, edema, etc.): 2LO2 via [place], VSS, x1 period coughing up sputum suctioned with yankaur by PT      Pertinent Vitals/Pain Pain Assessment: PAINAD Breathing: normal Negative Vocalization: occasional moan/groan, low speech, negative/disapproving quality Facial Expression: sad, frightened, frown Body Language: tense, distressed pacing, fidgeting Consolability: distracted or reassured by voice/touch PAINAD Score: 4 Pain Location: Generalized with any  mobility Pain Descriptors / Indicators: Grimacing;Guarding;Moaning Pain Intervention(s): Limited activity within patient's tolerance;Monitored during session;Repositioned    Home Living                          Prior Function            PT Goals (current goals can now be found in the care plan section) Acute Rehab PT Goals PT Goal Formulation: Patient unable to participate in goal setting Time For Goal Achievement: 11/06/21 Potential to Achieve Goals: Fair Progress towards PT goals:  Progressing toward goals    Frequency    Min 2X/week      PT Plan Current plan remains appropriate    Co-evaluation              AM-PAC PT "6 Clicks" Mobility   Outcome Measure  Help needed turning from your back to your side while in a flat bed without using bedrails?: A Lot Help needed moving from lying on your back to sitting on the side of a flat bed without using bedrails?: Total Help needed moving to and from a bed to a chair (including a wheelchair)?: Total Help needed standing up from a chair using your arms (e.g., wheelchair or bedside chair)?: Total Help needed to walk in hospital room?: Total Help needed climbing 3-5 steps with a railing? : Total 6 Click Score: 7    End of Session Equipment Utilized During Treatment: Oxygen;Other (comment) (prevalon boots and mitts re-donned at end of session) Activity Tolerance: Other (comment) (restlessness, dementia) Patient left: in bed;with call bell/phone within reach;with bed alarm set   PT Visit Diagnosis: Other abnormalities of gait and mobility (R26.89);Muscle weakness (generalized) (M62.81);Pain     Time: 3818-2993 PT Time Calculation (min) (ACUTE ONLY): 20 min  Charges:  $Therapeutic Activity: 8-22 mins                    Marye Round, PT DPT Acute Rehabilitation Services Pager [phone]  Office [phone]    [name] [name] 10/26/2021, 5:21 PM

## 2021-10-27 DIAGNOSIS — J189 Pneumonia, unspecified organism: Secondary | ICD-10-CM | POA: Diagnosis not present

## 2021-10-27 LAB — GLUCOSE, CAPILLARY
Glucose-Capillary: 109 mg/dL — ABNORMAL HIGH (ref 70–99)
Glucose-Capillary: 111 mg/dL — ABNORMAL HIGH (ref 70–99)
Glucose-Capillary: 131 mg/dL — ABNORMAL HIGH (ref 70–99)
Glucose-Capillary: 97 mg/dL (ref 70–99)
Glucose-Capillary: 126 mg/dL — ABNORMAL HIGH (ref 70–99)

## 2021-10-27 NOTE — Consult Note (Signed)
WOC Nurse Consult Note: Reason for Consult:Bilateral deep tissue pressure injuries to heels Wound type:Pressure vs perfusion Pressure Injury POA: Yes Measurement:Per Nursing Flow Sheet on 10/27/21. Left heel:4cm x 3cm  Right heel:5cm x 4cm  Wound HAL:PFXT purple/maroon blood-filled blisters Drainage (amount, consistency, odor) none Periwound: intact, dry Treatment/Plan of Care:  I have provided Nursing with guidance for the placement of bilateral pressure redistribution heel boots after performing topical care consisting of cleansing the areas with NS, patting dry and covering with folded pieces of antimicrobial nonadherent gauze (xeroform). This is to be covered with dry gauze and topped with an ABD pad for additional padding and secured with a few turns of Kerlix roll gauze/paper tape. Turning and repositioning is in place, but I will add placement of a silicone foam bordered dressing to the sacrum.The patient is at high risk for continued skin breakdown due to age, comorbid conditions including confusion, perfusion and infection and need for [facility] elevation.  WOC nursing team will not follow, but will remain available to this patient, the nursing and medical teams.  Please re-consult if needed. Thanks, Ladona Mow, MSN, RN, GNP, [place]  Pager# [phone]

## 2021-10-27 NOTE — Progress Notes (Signed)
PROGRESS NOTE    [name] [name]  SAY:[id number] DOB: Aug 02, 1935 DOA: 10/20/2021 PCP: Karna Dupes, MD   Chief Complaint  Patient presents with   Aspiration   Brief Narrative/Hospital Course:  [name] [name], 86 y.o. male with PMH of dementia, seizure disorder, BPH, GI bleeding brought to the ED with sudden onset of shortness of breath and rhonchi noted at  [facility] where he is a long-term resident.   In ED, found to have bilateral lower lobe pneumonia, COVID-positive, was previously positive in May and had negative test subsequently.   Is admitted and is being treated for aspiration pneumonia as well as COVID-19, hypoxic respiratory failure debility weakness and dysphagia.  Subjective: Seen and examined this morning.  He is alert awake able to tell me his name but does not know where he is unable to tell me the current date or current president's name.  He is on 2 L.  RN reports he has been verbalizing more today Still remains on NGT feeding appears poor and deconditioned unable to move his lower extremities well due to generalized weakness.  Has pain on knees on passive movement  Assessment & Plan:  Bilateral lower lobe pneumonia likely aspiration pneumonia: Patient symptomatic having cough, dysphagia severe weakness debility.  Repeat chest x-ray 10/31 with worsening infiltrates suspecting ongoing aspiration.  Currently n.p.o. on NG tube feeding, speech following.  Continue chest PT, I-S, deep breathing exercises, sputum induction mucolytic's bronchodilators. Recent Labs  Lab 10/20/21 2029 10/21/21 0229 10/22/21 0253 10/23/21 0757 10/24/21 0116 10/26/21 0327  WBC 4.7 5.6 5.2 5.5 5.1 7.7  LATICACIDVEN 1.4  --   --   --   --   --     COVID-19 infection: Symptomatic with severe anxiety deconditioning and worsening with bacterial pneumonia. Completed COVID directed therapy, D-dimer was trending down duplex negative for DVT.  Continue symptomatic management isolation, supportive  care.  Seizure disorder on Depakote and Keppra.  Continue the same with supportive precaution  Dementia: Likely with advanced dementia, long-term NH resident.  Continue seizure medication, Aricept, melatonin Risperdal. continue supportive care delirium precaution PT OT.  Essential hypertension: He is on midodrine at facility currently on hold.  Blood pressure is controlled  Generalized deconditioning/debility: Palliative care consulted  Goals of care: Currently full code at this time prognosis appears guarded, palliative care is following closely, given his advancing dementia and risk of aspiration now this patient pneumonia remains on NG tube feeding speech following.  BPH resume Flomax as able  Consult wound care for heel.  DVT prophylaxis: enoxaparin (LOVENOX) injection 40 mg Start: 10/21/21 1000 Code Status:   Code Status: Full Code Family Communication: plan of care discussed with patient and nursing staff at bedside. Status is: Inpatient Remains inpatient appropriate because: For ongoing management of aspiration pneumonia, dysphagia, unable to take orally   Objective: Vitals last 24 hrs: Vitals:   10/26/21 1953 10/27/21 0402 10/27/21 0921 10/27/21 1139  BP: 129/71 114/74 (!) 160/87 115/71  Pulse: (!) 56 77 76 67  Resp: 18 20 (!) 22 20  Temp: (!) 100.4 F (38 C) 99 F (37.2 C) 98.3 F (36.8 C) 97.9 F (36.6 C)  TempSrc: Axillary Axillary Oral Axillary  SpO2: 98% 98% 93% (!) 72%  Weight:      Height:       Weight change:   Intake/Output Summary (Last 24 hours) at 10/27/2021 1301 Last data filed at 10/27/2021 0503 Gross per 24 hour  Intake 654.11 ml  Output 500 ml  Net 154.11 ml   Net IO Since Admission: 3,934.39 mL [10/27/21 1301]   Physical Examination: General exam: Aa0x1, weak frail,older than stated age. HEENT:Oral mucosa moist, Ear/Nose WNL grossly,dentition normal. Respiratory system: B/l diminished at base, no use of accessory muscle, non  tender. Cardiovascular system: S1 & S2 +,No JVD. Gastrointestinal system: Abdomen soft, NT,ND, BS+.  Core track NG tube present Nervous System:Alert, awake, moving extremities. Extremities: edema +, distal peripheral pulses palpable.  Skin: No rashes, no icterus. MSK: Normal muscle bulk, tone, power.  Medications reviewed:  Scheduled Meds:  brinzolamide  1 drop Both Eyes TID   chlorhexidine  15 mL Mouth Rinse BID   Chlorhexidine Gluconate Cloth  6 each Topical Q0600   donepezil  10 mg Per Tube QHS   enoxaparin (LOVENOX) injection  40 mg Subcutaneous Q24H   latanoprost  1 drop Both Eyes QHS   levETIRAcetam  500 mg Oral BID   mouth rinse  15 mL Mouth Rinse q12n4p   memantine  10 mg Per Tube BID   mupirocin ointment  1 application Nasal BID   risperiDONE  0.5 mg Per Tube QHS   valproic acid  500 mg Per Tube BID   Continuous Infusions:  sodium chloride Stopped (10/27/21 0120)   feeding supplement (JEVITY 1.2 CAL) 1,000 mL (10/27/21 0534)   piperacillin-tazobactam (ZOSYN)  IV 3.375 g (10/27/21 0911)     Diet Order     None       Nutrition Problem: Inadequate oral intake Etiology: inability to eat Signs/Symptoms: NPO status Interventions: Tube feeding  Weight change:   Wt Readings from Last 3 Encounters:  10/25/21 81.5 kg  05/15/21 72 kg  05/12/21 72.9 kg     Consultants:see note  Procedures:see note Antimicrobials: Anti-infectives (From admission, onward)    Start     Dose/Rate Route Frequency Ordered Stop   10/22/21 1000  remdesivir 100 mg in sodium chloride 0.9 % 100 mL IVPB       See Hyperspace for full Linked Orders Report.   100 mg 200 mL/hr over 30 Minutes Intravenous Daily 10/21/21 0238 10/25/21 1030   10/21/21 0800  piperacillin-tazobactam (ZOSYN) IVPB 3.375 g        3.375 g 12.5 mL/hr over 240 Minutes Intravenous Every 8 hours 10/21/21 0240     10/21/21 0300  remdesivir 200 mg in sodium chloride 0.9% 250 mL IVPB       See Hyperspace for full Linked  Orders Report.   200 mg 580 mL/hr over 30 Minutes Intravenous Once 10/21/21 0238 10/21/21 0536   10/20/21 2200  piperacillin-tazobactam (ZOSYN) IVPB 3.375 g        3.375 g 100 mL/hr over 30 Minutes Intravenous  Once 10/20/21 2150 10/21/21 0303      Culture/Microbiology    Component Value Date/Time   SDES BLOOD SITE NOT SPECIFIED 10/20/2021 2029   SDES IN/OUT CATH URINE 10/20/2021 2029   SPECREQUEST  10/20/2021 2029    BOTTLES DRAWN AEROBIC AND ANAEROBIC Blood Culture results may not be optimal due to an inadequate volume of blood received in culture bottles   SPECREQUEST  10/20/2021 2029    NONE Performed at [facility], [address], [place], [place] [address]    CULT  10/20/2021 2029    NO GROWTH 5 DAYS Performed at [facility], [address] [place], [place] [address]    CULT MULTIPLE SPECIES PRESENT, SUGGEST RECOLLECTION (A) 10/20/2021 2029   REPTSTATUS 10/25/2021 FINAL 10/20/2021 2029  REPTSTATUS 10/21/2021 FINAL 10/20/2021 2029    Other culture-see note  Unresulted Labs (From admission, onward)     Start     Ordered   10/28/21 0500  Comprehensive metabolic panel  Tomorrow morning,   R       Question:  Specimen collection method  Answer:  Lab=Lab collect   10/27/21 1258   10/28/21 0500  CBC  Tomorrow morning,   R       Question:  Specimen collection method  Answer:  Lab=Lab collect   10/27/21 1258   10/22/21 0500  CBC with Differential/Platelet  Daily,   R      10/21/21 0238           Data Reviewed: I have personally reviewed following labs and imaging studies CBC: Recent Labs  Lab 10/21/21 0229 10/22/21 0253 10/23/21 0757 10/24/21 0116 10/26/21 0327  WBC 5.6 5.2 5.5 5.1 7.7  NEUTROABS 3.7 3.2 3.3 2.9 4.9  HGB 12.4* 12.6* 13.2 11.1* 10.8*  HCT 38.9* 40.7 42.0 35.9* 34.9*  MCV 84.7 87.9 87.1 87.3 88.4  PLT 248 227 195 177 224   Basic Metabolic Panel: Recent Labs  Lab 10/22/21 0253 10/23/21 0757 10/24/21 0116 10/25/21 0500  10/26/21 0327  NA 143 145 145 142 141  K 4.0 4.0 3.5 3.6 4.3  CL 104 107 110 106 109  CO2 31 30 30 29 25   GLUCOSE 94 121* 119* 139* 134*  BUN 24* 20 17 16 21   CREATININE 1.10 0.97 0.88 0.87 0.86  CALCIUM 8.8* 9.0 8.4* 8.6* 8.1*   GFR: Estimated Creatinine Clearance: 72.3 mL/min (by C-G formula based on SCr of 0.86 mg/dL). Liver Function Tests: Recent Labs  Lab 10/22/21 0253 10/23/21 0757 10/24/21 0116 10/25/21 0500 10/26/21 0327  AST 48* 40 30 28 27   ALT 33 30 25 25 20   ALKPHOS 84 72 71 73 57  BILITOT 0.6 0.8 0.8 0.5 0.5  PROT 6.0* 5.9* 5.2* 5.6* 4.6*  ALBUMIN 2.2* 2.3* 2.0* 2.0* 1.7*   No results for input(s): LIPASE, AMYLASE in the last 168 hours. No results for input(s): AMMONIA in the last 168 hours. Coagulation Profile: Recent Labs  Lab 10/20/21 2029  INR 1.3*   Cardiac Enzymes: No results for input(s): CKTOTAL, CKMB, CKMBINDEX, TROPONINI in the last 168 hours. BNP (last 3 results) No results for input(s): PROBNP in the last 8760 hours. HbA1C: No results for input(s): HGBA1C in the last 72 hours. CBG: Recent Labs  Lab 10/26/21 1948 10/26/21 2308 10/27/21 0358 10/27/21 0743 10/27/21 1142  GLUCAP 122* 118* 109* 111* 131*   Lipid Profile: No results for input(s): CHOL, HDL, LDLCALC, TRIG, CHOLHDL, LDLDIRECT in the last 72 hours. Thyroid Function Tests: No results for input(s): TSH, T4TOTAL, FREET4, T3FREE, THYROIDAB in the last 72 hours. Anemia Panel: No results for input(s): VITAMINB12, FOLATE, FERRITIN, TIBC, IRON, RETICCTPCT in the last 72 hours. Sepsis Labs: Recent Labs  Lab 10/20/21 2029  LATICACIDVEN 1.4    Recent Results (from the past 240 hour(s))  Blood culture (routine single)     Status: None   Collection Time: 10/20/21  8:29 PM   Specimen: BLOOD  Result Value Ref Range Status   Specimen Description BLOOD SITE NOT SPECIFIED  Final   Special Requests   Final    BOTTLES DRAWN AEROBIC AND ANAEROBIC Blood Culture results may not be  optimal due to an inadequate volume of blood received in culture bottles   Culture   Final    NO GROWTH 5  DAYS Performed at [facility], [address], [place], [place] [address]    Report Status 10/25/2021 FINAL  Final  Urine Culture     Status: Abnormal   Collection Time: 10/20/21  8:29 PM   Specimen: In/Out Cath Urine  Result Value Ref Range Status   Specimen Description IN/OUT CATH URINE  Final   Special Requests   Final    NONE Performed at [facility], [address] [place], [place] [address]    Culture MULTIPLE SPECIES PRESENT, SUGGEST RECOLLECTION (A)  Final   Report Status 10/21/2021 FINAL  Final  Resp Panel by RT-PCR (Flu A&B, Covid) Nasopharyngeal Swab     Status: Abnormal   Collection Time: 10/20/21  8:31 PM   Specimen: Nasopharyngeal Swab; Nasopharyngeal(NP) swabs in vial transport medium  Result Value Ref Range Status   SARS Coronavirus 2 by RT PCR POSITIVE (A) NEGATIVE Final    Comment: RESULT CALLED TO, READ BACK BY AND VERIFIED WITH: S NOLTE,RN@[id number]  10/20/21 MK (NOTE) SARS-CoV-2 target nucleic acids are DETECTED.  The SARS-CoV-2 RNA is generally detectable in upper respiratory specimens during the acute phase of infection. Positive results are indicative of the presence of the identified virus, but do not rule out bacterial infection or co-infection with other pathogens not detected by the test. Clinical correlation with patient history and other diagnostic information is necessary to determine patient infection status. The expected result is Negative.  Fact Sheet for Patients: BloggerCourse.com  Fact Sheet for Healthcare Providers: SeriousBroker.it  This test is not yet approved or cleared by the [place] FDA and  has been authorized for detection and/or diagnosis of SARS-CoV-2 by FDA under an Emergency Use Authorization (EUA).  This EUA will remain in effect (meaning this test can be  used)  for the duration of  the COVID-19 declaration under Section 564(b)(1) of the Act, 21 U.S.C. section 360bbb-3(b)(1), unless the authorization is terminated or revoked sooner.     Influenza A by PCR NEGATIVE NEGATIVE Final   Influenza B by PCR NEGATIVE NEGATIVE Final    Comment: (NOTE) The Xpert Xpress SARS-CoV-2/FLU/RSV plus assay is intended as an aid in the diagnosis of influenza from Nasopharyngeal swab specimens and should not be used as a sole basis for treatment. Nasal washings and aspirates are unacceptable for Xpert Xpress SARS-CoV-2/FLU/RSV testing.  Fact Sheet for Patients: BloggerCourse.com  Fact Sheet for Healthcare Providers: SeriousBroker.it  This test is not yet approved or cleared by the [place] FDA and has been authorized for detection and/or diagnosis of SARS-CoV-2 by FDA under an Emergency Use Authorization (EUA). This EUA will remain in effect (meaning this test can be used) for the duration of the COVID-19 declaration under Section 564(b)(1) of the Act, 21 U.S.C. section 360bbb-3(b)(1), unless the authorization is terminated or revoked.  Performed at [facility], [address], [place], [place] [address]   MRSA Next Gen by PCR, Nasal     Status: Abnormal   Collection Time: 10/25/21 12:34 PM   Specimen: Nasal Mucosa; Nasal Swab  Result Value Ref Range Status   MRSA by PCR Next Gen DETECTED (A) NOT DETECTED Final    Comment: RESULT CALLED TO, READ BACK BY AND VERIFIED WITH: Ulyses Southward RN [id number] 10/25/21 A BROWNING (NOTE) The GeneXpert MRSA Assay (FDA approved for NASAL specimens only), is one component of a comprehensive MRSA colonization surveillance program. It is not intended to diagnose MRSA infection nor to guide or monitor treatment for MRSA  infections. Test performance is not FDA approved in patients less than 82 years old. Performed at [facility], [address],  [place], [place] [address]      Radiology Studies: CT HEAD WO CONTRAST ( )  Result Date: 10/25/2021 CLINICAL DATA:  Altered mental status. EXAM: CT HEAD WITHOUT CONTRAST TECHNIQUE: Contiguous axial images were obtained from the base of the skull through the vertex without intravenous contrast. COMPARISON:  CT head 05/07/2021 FINDINGS: Brain: No evidence of acute infarction, hemorrhage, hydrocephalus, extra-axial collection or mass lesion/mass effect. Advanced cortical volume loss. Patchy hypodensities in the periventricular and subcortical white matter, likely secondary to chronic microvascular ischemic changes. Vascular: Calcified plaques in the carotid siphons. Skull: Normal. Negative for fracture or focal lesion. Sinuses/Orbits: Mild-to-moderate mucosal thickening in the paranasal sinuses. Other: None. IMPRESSION: Chronic changes with no acute intracranial process identified. Electronically Signed   By: Jannifer Hick M.D.   On: 10/25/2021 13:25   DG Abd Portable 1V  Result Date: 10/25/2021 CLINICAL DATA:  Feeding tube placement. EXAM: PORTABLE ABDOMEN - 1 VIEW COMPARISON:  October 23, 2021. FINDINGS: The bowel gas pattern is normal. Residual contrast is seen in the colon. Distal tip of feeding tube is seen in expected position of distal stomach. No radio-opaque calculi or other significant radiographic abnormality are seen. IMPRESSION: Distal tip of feeding tube seen in expected position of distal stomach. Electronically Signed   By: Lupita Raider M.D.   On: 10/25/2021 17:29     LOS: 7 days   Lanae Boast, MD [facility]  10/27/2021, 1:01 PM

## 2021-10-28 ENCOUNTER — Inpatient Hospital Stay (HOSPITAL_COMMUNITY): Payer: Medicare Other

## 2021-10-28 DIAGNOSIS — F0390 Unspecified dementia without behavioral disturbance: Secondary | ICD-10-CM | POA: Diagnosis not present

## 2021-10-28 DIAGNOSIS — R1310 Dysphagia, unspecified: Secondary | ICD-10-CM | POA: Diagnosis not present

## 2021-10-28 DIAGNOSIS — J189 Pneumonia, unspecified organism: Secondary | ICD-10-CM | POA: Diagnosis not present

## 2021-10-28 DIAGNOSIS — J690 Pneumonitis due to inhalation of food and vomit: Secondary | ICD-10-CM | POA: Diagnosis not present

## 2021-10-28 LAB — CBC
HCT: 34.2 % — ABNORMAL LOW (ref 39.0–52.0)
Hemoglobin: 10.6 g/dL — ABNORMAL LOW (ref 13.0–17.0)
MCH: 27.4 pg (ref 26.0–34.0)
MCHC: 31 g/dL (ref 30.0–36.0)
MCV: 88.4 fL (ref 80.0–100.0)
Platelets: 335 10*3/uL (ref 150–400)
RBC: 3.87 MIL/uL — ABNORMAL LOW (ref 4.22–5.81)
RDW: 13.6 % (ref 11.5–15.5)
WBC: 6.3 10*3/uL (ref 4.0–10.5)
nRBC: 0 % (ref 0.0–0.2)

## 2021-10-28 LAB — COMPREHENSIVE METABOLIC PANEL
ALT: 19 U/L (ref 0–44)
AST: 22 U/L (ref 15–41)
Albumin: 1.7 g/dL — ABNORMAL LOW (ref 3.5–5.0)
Alkaline Phosphatase: 54 U/L (ref 38–126)
Anion gap: 5 (ref 5–15)
BUN: 18 mg/dL (ref 8–23)
CO2: 32 mmol/L (ref 22–32)
Calcium: 8.3 mg/dL — ABNORMAL LOW (ref 8.9–10.3)
Chloride: 104 mmol/L (ref 98–111)
Creatinine, Ser: 0.64 mg/dL (ref 0.61–1.24)
GFR, Estimated: >60 mL/min (ref >60)
Glucose, Bld: 150 mg/dL — ABNORMAL HIGH (ref 70–99)
Potassium: 4 mmol/L (ref 3.5–5.1)
Sodium: 141 mmol/L (ref 135–145)
Total Bilirubin: 0.3 mg/dL (ref 0.3–1.2)
Total Protein: 4.8 g/dL — ABNORMAL LOW (ref 6.5–8.1)

## 2021-10-28 LAB — GLUCOSE, CAPILLARY
Glucose-Capillary: 100 mg/dL — ABNORMAL HIGH (ref 70–99)
Glucose-Capillary: 138 mg/dL — ABNORMAL HIGH (ref 70–99)
Glucose-Capillary: 138 mg/dL — ABNORMAL HIGH (ref 70–99)
Glucose-Capillary: 143 mg/dL — ABNORMAL HIGH (ref 70–99)
Glucose-Capillary: 70 mg/dL (ref 70–99)
Glucose-Capillary: 88 mg/dL (ref 70–99)

## 2021-10-28 LAB — PROCALCITONIN: Procalcitonin: <0.1 ng/mL

## 2021-10-28 MED ORDER — SODIUM CHLORIDE 0.9 % IV SOLN
3.0000 g | Freq: Four times a day (QID) | INTRAVENOUS | Status: DC
  Administered 2021-10-28 – 2021-10-29 (×3): 3 g via INTRAVENOUS
  Filled 2021-10-28 (×6): qty 8

## 2021-10-28 NOTE — Progress Notes (Signed)
Patient ID: [name], male   DOB: 06-20-1935, 86 y.o.   MRN: [id number]    Progress Note from the Palliative Medicine Team at [facility]   Patient Name: [name] [name]        Date: 10/28/2021 DOB: Apr 27, 1935  Age: 86 y.o. MRN#: [id number] Attending Physician: Lanae Boast, MD Primary Care Physician: Karna Dupes, MD Admit Date: 10/20/2021   Medical records reviewed   86 y.o. male  admitted on 10/20/2021 with dementia, seizure disorder, BPH, GI bleeding brought to the emergency room with sudden onset of shortness of breath and rhonchi noted at nursing home.     He is a long-term nursing home resident/[facility],  found to have bilateral lower lobe pneumonia as well as positive COVID-19 test.  He was tested positive for COVID in May of this year, he had negative test in between.  Treated with Zosyn as aspiration pneumonia as well as remdesivir.   Started on tube feeding on 10/29 with persistent inability to eat.   Family report continued physical and functional and cognitive decline over the past many months.  He has had 2 different skilled nursing facility admissions, failed attempts at home and multiple ER and hospital admissions over the past several months    Currently he is weak, with cor-trac for nutritional support.  Frail and high risk for decompensation.   Today is day 7 of this hospitalization     Family face treatment option decisions and advanced directive decisions and anticipatory care needs.    This NP reviewed medical records , discussed case with treatment team as a follow up for palliative medicine needs and emotional support.   I spoke to son by telephone.  Continued conversation regarding current medical situation, diagnosis, prognosis, goals of care, end-of-life wishes, disposition and options.  Education offered on adult failure to thrive and the limitations of medical interventions to prolong quality of life when the body does fail to  thrive.  Education offered on transition of care options as it relates to skilled nursing facility both rehab and long-term, hospice benefit and eligibility and out-of-pocket caregivers.  Family verbalize frustration with limited transition of care options  Again education offered on the importance of advance care planning specific CODE STATUS          Educated family to consider DNR/DNI status understanding evidenced based poor outcomes in similar hospitalized patient, as the cause of arrest is likely associated with advanced chronic illness rather than an easily reversible acute cardio-pulmonary event.  Patient remains a full code  Family is open to all offered and available medical interventions to prolong life.   Discussed with family the importance of continued conversation with each other  and the medical providers regarding overall plan of care and treatment options,  ensuring decisions are within the context of the patients values and GOCs.  Questions and concerns addressed   Discussed with Dr  Jonathon Bellows  and [name] CMRN TOC via secure chat  Total time spent on the unit was 45 minutes  Greater than 50% of the time was spent in counseling and coordination of care  Lorinda Creed NP  Palliative Medicine Team Team Phone # [phone] Pager [phone]

## 2021-10-28 NOTE — Progress Notes (Signed)
Modified Barium Swallow Progress Note  Patient Details  Name: [name] [name] MRN: [id number] Date of Birth: 09-14-1935  Today's Date: 10/28/2021  Modified Barium Swallow completed.  Full report located under Chart Review in the Imaging Section.  Brief recommendations include the following:  Clinical Impression  Pt presents with oropharyngeal dysphagia characterized by decreased bolus cohesion, a pharyngeal delay, reduced lingual retraction, and reduced anterior laryngeal movement. He demonstrate mild vallecular residue and moderate pyriform sinus residue. Residue increased with bolus size and with advancement of solid bolus consistency. Residue was eliminated with consecutive swallows of thin liquids. However, epiglottic inversion was inconsistent with liquids. Incomplete epiglottic inversion, combined with the presence of pyriform sinus residue, and reduced distension of the pharyngoesophageal segment resulted in penetration (PAS 5) and sensed aspiration (PAS 7) of thin liquids and nectar thick liquids. Coughing was effective in mobilizing penetrate and in expelling less severe (PAS 3) instances of trace penetration. Laryngeal invasion was eliminated with reduced bolus sizes of nectar thick liquids via cup and with cued secondary swallows; however, pt demonstrated difficulty with consistency of the latter. Pt exhibited impulsive tendencies which places him at increased risk for aspiration of liquids given his pharyngeal delay. A dysphagia 2 diet with honey thick liquids will be initiated at this time. SLP will follow for dysphagia treatment, and advancement as clinically indicated.   Swallow Evaluation Recommendations       SLP Diet Recommendations: Dysphagia 2 (Fine chop) solids;Honey thick liquids   Liquid Administration via: Cup;No straw   Medication Administration: Via alternative means (or crushed in puree)   Supervision: Full supervision/cueing for compensatory strategies;Staff to  assist with self feeding   Compensations: Minimize environmental distractions;Slow rate;Small sips/bites;Follow solids with liquid   Postural Changes: Seated upright at 90 degrees   Oral Care Recommendations: Staff/trained caregiver to provide oral care;Oral care BID     [name] Nilan I. [name], MS, CCC-SLP Acute Rehabilitation Services Office number [phone] Pager [phone]    [name] 10/28/2021,4:04 PM

## 2021-10-28 NOTE — Progress Notes (Signed)
Pharmacy Antibiotic Note  [name] [name] is a 86 y.o. male admitted on 10/20/2021 with  Aspiration Pneumonia .  Pharmacy has been consulted for Unasyn dosing.  Plan: Stop Zosyn Start Unasyn 3g Q6H  F/u clinical signs improvement, end of therapy when appropriate   Height: 6\' 1"  (185.4 cm) Weight: 83.7 kg (184 lb 8.4 oz) IBW/kg (Calculated) : 79.9  Temp (24hrs), Avg:97.9 F (36.6 C), Min:97.6 F (36.4 C), Max:98.5 F (36.9 C)  Recent Labs  Lab 10/22/21 0253 10/23/21 0757 10/24/21 0116 10/25/21 0500 10/26/21 0327 10/28/21 0309  WBC 5.2 5.5 5.1  --  7.7 6.3  CREATININE 1.10 0.97 0.88 0.87 0.86 0.64    Estimated Creatinine Clearance: 77.7 mL/min (by C-G formula based on SCr of 0.64 mg/dL).    Allergies  Allergen Reactions   Chocolate     Itching if he eats too much chocolate    Ciprofloxacin     Achilles tendon pain    Lipitor [Atorvastatin]     Muscle aches     Antimicrobials this admission: Zosyn 10/26 >> 11/03 Unasyn 11/03 >>     Microbiology results: 10/26 COVID+ 10/31 MRSA PCR: positive   Thank you for allowing pharmacy to be a part of this patient's care.  11/31, PharmD Clinical Pharmacist

## 2021-10-28 NOTE — Progress Notes (Signed)
Speech Language Pathology Treatment: Dysphagia  Patient Details Name: [name] [name] MRN: [id number] DOB: 06-Dec-1935 Today's Date: 10/28/2021 Time: 8338-2505 SLP Time Calculation (min) (ACUTE ONLY): 14 min  Assessment / Plan / Recommendation Clinical Impression  Pt was seen for dysphagia treatment. He was alert and cooperative during the session. Pt demonstrated some confusion, but asked questions pertinent to his care. Pt tolerated ~3oz of thin consecutive swallows of thin liquids, puree solids, ice chips, and honey thick liquids via straw without immediate signs of aspiration. Pt's swallow subjectively appeared adequately timely. Mastication was [facility] with regular textures and oral clearance adequate. Pt demonstrated impulsive tendencies and he exhibited significant coughing after intake of over 4oz of water via consecutive swallows. A wet vocal quality was noted between trials. SLP is unsure of the relatedness of this to which, if any, bolus. A repeat MBS is recommended to reassess swallow function and to determine readiness for p.o. intake. It is currently scheduled for today at 1400.    HPI HPI: Pt is an 86 y/o male admitted 10/26 from nursing home with respiratory distress/concern for aspiration. CXR 10/26: Bilateral lower lung airspace disease, left greater than right, concerning for infection. PMH: dementia, seizures, BPH, GI bleed, HTN, HLD, hyperglycemia.      SLP Plan  MBS      Recommendations for follow up therapy are one component of a multi-disciplinary discharge planning process, led by the attending physician.  Recommendations may be updated based on patient status, additional functional criteria and insurance authorization.    Recommendations  Diet recommendations: NPO (ice chips) Liquids provided via: Teaspoon Medication Administration: Via alternative means Supervision: Full supervision/cueing for compensatory strategies Compensations: Minimize environmental  distractions;Slow rate Postural Changes and/or Swallow Maneuvers: Seated upright 90 degrees                Oral Care Recommendations: Oral care QID;Staff/trained caregiver to provide oral care Follow up Recommendations: Skilled Nursing facility SLP Visit Diagnosis: Dysphagia, oropharyngeal phase (R13.12) Plan: MBS       Mayda Shippee I. [name], MS, CCC-SLP Acute Rehabilitation Services Office number [phone] Pager [phone]                [name]  10/28/2021, 10:23 AM

## 2021-10-28 NOTE — Progress Notes (Signed)
PROGRESS NOTE    [name] [name]  WGN:[id number] DOB: 1935-12-23 DOA: 10/20/2021 PCP: Karna Dupes, MD   Chief Complaint  Patient presents with   Aspiration   Brief Narrative/Hospital Course:  [name] [name], 86 y.o. male with PMH of dementia, seizure disorder, BPH, GI bleeding brought to the ED with sudden onset of shortness of breath and rhonchi noted at  [facility] where he is a long-term resident.   In ED, found to have bilateral lower lobe pneumonia, COVID-positive, was previously positive in May and had negative test subsequently.   Is admitted and is being treated for aspiration pneumonia as well as COVID-19, hypoxic respiratory failure debility weakness and dysphagia. He has bilateral heel deep tissue injury POA and managed with wound care  Subjective: Seen this morning appears more alert awake answers questions and knows the current location current president's name Overnight afebrile. Labs look stable with a low albumin 1.7 Continues to be on NG tube feeding.  Assessment & Plan:  Bilateral lower lobe pneumonia likely aspiration pneumonia: Patient symptomatic  w/ cough, severe weakness debility.  Repeat chest x-ray 10/31 with worsening infiltrates suspecting ongoing aspiration.  Currently n.p.o. on NG tube feeding, speech following.  Leukocytosis improved procalcitonin reassuring, switch to Unasyn - will complete total 10 days course . Continue chest PT, I-S, deep breathing exercises, sputum induction mucolytic's bronchodilators and on the supportive measures.   Currently afebrile and no leukocytosis.await palliative care discussion regarding further goals of care,  feeding plan if still unable to take po. Recent Labs  Lab 10/22/21 0253 10/23/21 0757 10/24/21 0116 10/26/21 0327 10/28/21 0309  WBC 5.2 5.5 5.1 7.7 6.3  PROCALCITON  --   --   --   --  <0.10    COVID-19 infection: Symptomatic with severe weakness/deconditioning and along with  bacterial pneumonia. Completed  COVID directed therapy, D-dimer was trending down duplex negative for DVT.  Continue symptomatic management isolation, supportive care.  Seizure disorder stable continue Depakote and Keppra.  Continue the same with supportive precaution  Dementia: Likely with advanced dementia, long-term NH resident.  Continue seizure medication, Aricept, melatonin Risperdal.  Continue supportive care, delirium precaution ambulate with PT OT.   Essential hypertension: Blood pressure is stable.  He is on midodrine at facility currently on hold.  Generalized deconditioning/debility/failure to thrive: In the setting of COVID-19 infection, bacterial pneumonia multiple comorbidities dementia.  Overall prognosis appears guarded.  Palliative care is following closely-?? PEG -but it will not prevent aspiration.  Goals of care: Currently full code at this time prognosis appears guarded, palliative care is following closely, given his advancing dementia and risk of aspiration and now  w/ pneumonia , he remains on NG tube feeding speech following.  BPH Flomax as able  Consult wound care for heel.  Bilateral deep tissue pressure injuries to heels POA.  Pressure versus perfusion wound , continue redistribution  heel boot, topical care Xeroform dressing dry gauze.  Monitor.  DVT prophylaxis: enoxaparin (LOVENOX) injection 40 mg Start: 10/21/21 1000 Code Status:   Code Status: Full Code Family Communication: plan of care discussed with patient and nursing staff at bedside. Status is: Inpatient Remains inpatient appropriate because: For ongoing management of aspiration pneumonia, dysphagia, unable to take orally. Proxy remains a guardian, palliative care following closely remains on NG tube feeding with ongoing risk of aspiration.   Objective: Vitals last 24 hrs: Vitals:   10/27/21 1139 10/27/21 2128 10/28/21 0450 10/28/21 0852  BP: 115/71 (!) 156/83 (!) 156/65 Marland Kitchen)  142/85  Pulse: 67 60 69 69  Resp: 20 18 18 18    Temp: 97.9 F (36.6 C) 97.6 F (36.4 C) 97.6 F (36.4 C) 98.5 F (36.9 C)  TempSrc: Axillary Axillary Oral Oral  SpO2: (!) 72% 100% 100% 100%  Weight:   83.7 kg   Height:       Weight change:   Intake/Output Summary (Last 24 hours) at 10/28/2021 1317 Last data filed at 10/28/2021 0500 Gross per 24 hour  Intake 170 ml  Output 951 ml  Net -781 ml   Net IO Since Admission: 3,153.39 mL [10/28/21 1317]   Physical Examination: General exam: AAOx 2 older than stated age, weak appearing. HEENT:Oral mucosa moist, Ear/Nose WNL grossly, dentition normal. Respiratory system: bilaterally diminished, no use of accessory muscle Cardiovascular system: S1 & S2 +, No JVD,. Gastrointestinal system: Abdomen soft, NT,ND, BS+ Nervous System:Alert, awake, moving extremities and grossly nonfocal Extremities: mild leg edema, distal peripheral pulses palpable.  Skin: No rashes,no icterus. MSK: Normal muscle bulk,tone, power   Medications reviewed:  Scheduled Meds:  brinzolamide  1 drop Both Eyes TID   chlorhexidine  15 mL Mouth Rinse BID   Chlorhexidine Gluconate Cloth  6 each Topical Q0600   donepezil  10 mg Per Tube QHS   enoxaparin (LOVENOX) injection  40 mg Subcutaneous Q24H   latanoprost  1 drop Both Eyes QHS   levETIRAcetam  500 mg Oral BID   mouth rinse  15 mL Mouth Rinse q12n4p   memantine  10 mg Per Tube BID   mupirocin ointment  1 application Nasal BID   risperiDONE  0.5 mg Per Tube QHS   valproic acid  500 mg Per Tube BID   Continuous Infusions:  sodium chloride Stopped (10/27/21 0120)   ampicillin-sulbactam (UNASYN) IV     feeding supplement (JEVITY 1.2 CAL) 60 mL/hr at 10/27/21 1600     Diet Order     None       Nutrition Problem: Inadequate oral intake Etiology: inability to eat Signs/Symptoms: NPO status Interventions: Tube feeding  Weight change:   Wt Readings from Last 3 Encounters:  10/28/21 83.7 kg  05/15/21 72 kg  05/12/21 72.9 kg     Consultants:see  note  Procedures:see note Antimicrobials: Anti-infectives (From admission, onward)    Start     Dose/Rate Route Frequency Ordered Stop   10/28/21 1600  Ampicillin-Sulbactam (UNASYN) 3 g in sodium chloride 0.9 % 100 mL IVPB        3 g 200 mL/hr over 30 Minutes Intravenous Every 6 hours 10/28/21 1243     10/22/21 1000  remdesivir 100 mg in sodium chloride 0.9 % 100 mL IVPB       See Hyperspace for full Linked Orders Report.   100 mg 200 mL/hr over 30 Minutes Intravenous Daily 10/21/21 0238 10/25/21 1030   10/21/21 0800  piperacillin-tazobactam (ZOSYN) IVPB 3.375 g  Status:  Discontinued        3.375 g 12.5 mL/hr over 240 Minutes Intravenous Every 8 hours 10/21/21 0240 10/28/21 1243   10/21/21 0300  remdesivir 200 mg in sodium chloride 0.9% 250 mL IVPB       See Hyperspace for full Linked Orders Report.   200 mg 580 mL/hr over 30 Minutes Intravenous Once 10/21/21 0238 10/21/21 0536   10/20/21 2200  piperacillin-tazobactam (ZOSYN) IVPB 3.375 g        3.375 g 100 mL/hr over 30 Minutes Intravenous  Once 10/20/21 2150 10/21/21 0303  Culture/Microbiology    Component Value Date/Time   SDES BLOOD SITE NOT SPECIFIED 10/20/2021 2029   SDES IN/OUT CATH URINE 10/20/2021 2029   SPECREQUEST  10/20/2021 2029    BOTTLES DRAWN AEROBIC AND ANAEROBIC Blood Culture results may not be optimal due to an inadequate volume of blood received in culture bottles   SPECREQUEST  10/20/2021 2029    NONE Performed at [facility], [address] [place], [place] [address]    CULT  10/20/2021 2029    NO GROWTH 5 DAYS Performed at [facility], [address], [place], [place] [address]    CULT MULTIPLE SPECIES PRESENT, SUGGEST RECOLLECTION (A) 10/20/2021 2029   REPTSTATUS 10/25/2021 FINAL 10/20/2021 2029   REPTSTATUS 10/21/2021 FINAL 10/20/2021 2029    Other culture-see note  Unresulted Labs (From admission, onward)     Start     Ordered   10/29/21 0500  Procalcitonin  Daily,   R      Question:  Specimen collection method  Answer:  Lab=Lab collect   10/28/21 1140   10/29/21 0500  Basic metabolic panel  Daily,   R     Question:  Specimen collection method  Answer:  Lab=Lab collect   10/28/21 1140   10/22/21 0500  CBC with Differential/Platelet  Daily,   R      10/21/21 0238           Data Reviewed: I have personally reviewed following labs and imaging studies CBC: Recent Labs  Lab 10/22/21 0253 10/23/21 0757 10/24/21 0116 10/26/21 0327 10/28/21 0309  WBC 5.2 5.5 5.1 7.7 6.3  NEUTROABS 3.2 3.3 2.9 4.9  --   HGB 12.6* 13.2 11.1* 10.8* 10.6*  HCT 40.7 42.0 35.9* 34.9* 34.2*  MCV 87.9 87.1 87.3 88.4 88.4  PLT 227 195 177 224 335   Basic Metabolic Panel: Recent Labs  Lab 10/23/21 0757 10/24/21 0116 10/25/21 0500 10/26/21 0327 10/28/21 0309  NA 145 145 142 141 141  K 4.0 3.5 3.6 4.3 4.0  CL 107 110 106 109 104  CO2 30 30 29 25  32  GLUCOSE 121* 119* 139* 134* 150*  BUN 20 17 16 21 18   CREATININE 0.97 0.88 0.87 0.86 0.64  CALCIUM 9.0 8.4* 8.6* 8.1* 8.3*   GFR: Estimated Creatinine Clearance: 77.7 mL/min (by C-G formula based on SCr of 0.64 mg/dL). Liver Function Tests: Recent Labs  Lab 10/23/21 0757 10/24/21 0116 10/25/21 0500 10/26/21 0327 10/28/21 0309  AST 40 30 28 27 22   ALT 30 25 25 20 19   ALKPHOS 72 71 73 57 54  BILITOT 0.8 0.8 0.5 0.5 0.3  PROT 5.9* 5.2* 5.6* 4.6* 4.8*  ALBUMIN 2.3* 2.0* 2.0* 1.7* 1.7*   No results for input(s): LIPASE, AMYLASE in the last 168 hours. No results for input(s): AMMONIA in the last 168 hours. Coagulation Profile: No results for input(s): INR, PROTIME in the last 168 hours.  Cardiac Enzymes: No results for input(s): CKTOTAL, CKMB, CKMBINDEX, TROPONINI in the last 168 hours. BNP (last 3 results) No results for input(s): PROBNP in the last 8760 hours. HbA1C: No results for input(s): HGBA1C in the last 72 hours. CBG: Recent Labs  Lab 10/27/21 1555 10/27/21 2124 10/27/21 2359 10/28/21 0449  10/28/21 0815  GLUCAP 97 126* 143* 138* 88   Lipid Profile: No results for input(s): CHOL, HDL, LDLCALC, TRIG, CHOLHDL, LDLDIRECT in the last 72 hours. Thyroid Function Tests: No results for input(s): TSH, T4TOTAL, FREET4, T3FREE, THYROIDAB in the last  72 hours. Anemia Panel: No results for input(s): VITAMINB12, FOLATE, FERRITIN, TIBC, IRON, RETICCTPCT in the last 72 hours. Sepsis Labs: Recent Labs  Lab 10/28/21 0309  PROCALCITON <0.10    Recent Results (from the past 240 hour(s))  Blood culture (routine single)     Status: None   Collection Time: 10/20/21  8:29 PM   Specimen: BLOOD  Result Value Ref Range Status   Specimen Description BLOOD SITE NOT SPECIFIED  Final   Special Requests   Final    BOTTLES DRAWN AEROBIC AND ANAEROBIC Blood Culture results may not be optimal due to an inadequate volume of blood received in culture bottles   Culture   Final    NO GROWTH 5 DAYS Performed at [facility], [address], [place], [place] [address]    Report Status 10/25/2021 FINAL  Final  Urine Culture     Status: Abnormal   Collection Time: 10/20/21  8:29 PM   Specimen: In/Out Cath Urine  Result Value Ref Range Status   Specimen Description IN/OUT CATH URINE  Final   Special Requests   Final    NONE Performed at [facility], [address], [place], [place] [address]    Culture MULTIPLE SPECIES PRESENT, SUGGEST RECOLLECTION (A)  Final   Report Status 10/21/2021 FINAL  Final  Resp Panel by RT-PCR (Flu A&B, Covid) Nasopharyngeal Swab     Status: Abnormal   Collection Time: 10/20/21  8:31 PM   Specimen: Nasopharyngeal Swab; Nasopharyngeal(NP) swabs in vial transport medium  Result Value Ref Range Status   SARS Coronavirus 2 by RT PCR POSITIVE (A) NEGATIVE Final    Comment: RESULT CALLED TO, READ BACK BY AND VERIFIED WITH: S NOLTE,RN@[id number]  10/20/21 MK (NOTE) SARS-CoV-2 target nucleic acids are DETECTED.  The SARS-CoV-2 RNA is generally detectable in upper  respiratory specimens during the acute phase of infection. Positive results are indicative of the presence of the identified virus, but do not rule out bacterial infection or co-infection with other pathogens not detected by the test. Clinical correlation with patient history and other diagnostic information is necessary to determine patient infection status. The expected result is Negative.  Fact Sheet for Patients: BloggerCourse.com  Fact Sheet for Healthcare Providers: SeriousBroker.it  This test is not yet approved or cleared by the [place] FDA and  has been authorized for detection and/or diagnosis of SARS-CoV-2 by FDA under an Emergency Use Authorization (EUA).  This EUA will remain in effect (meaning this test can be used)  for the duration of  the COVID-19 declaration under Section 564(b)(1) of the Act, 21 U.S.C. section 360bbb-3(b)(1), unless the authorization is terminated or revoked sooner.     Influenza A by PCR NEGATIVE NEGATIVE Final   Influenza B by PCR NEGATIVE NEGATIVE Final    Comment: (NOTE) The Xpert Xpress SARS-CoV-2/FLU/RSV plus assay is intended as an aid in the diagnosis of influenza from Nasopharyngeal swab specimens and should not be used as a sole basis for treatment. Nasal washings and aspirates are unacceptable for Xpert Xpress SARS-CoV-2/FLU/RSV testing.  Fact Sheet for Patients: BloggerCourse.com  Fact Sheet for Healthcare Providers: SeriousBroker.it  This test is not yet approved or cleared by the [place] FDA and has been authorized for detection and/or diagnosis of SARS-CoV-2 by FDA under an Emergency Use Authorization (EUA). This EUA will remain in effect (meaning this test can be used) for the duration of the COVID-19 declaration under Section 564(b)(1) of the Act, 21 U.S.C. section 360bbb-3(b)(1),  unless the authorization is  terminated or revoked.  Performed at [facility], [address], [place], [place] [address]   MRSA Next Gen by PCR, Nasal     Status: Abnormal   Collection Time: 10/25/21 12:34 PM   Specimen: Nasal Mucosa; Nasal Swab  Result Value Ref Range Status   MRSA by PCR Next Gen DETECTED (A) NOT DETECTED Final    Comment: RESULT CALLED TO, READ BACK BY AND VERIFIED WITH: Ulyses Southward RN [id number] 10/25/21 A BROWNING (NOTE) The GeneXpert MRSA Assay (FDA approved for NASAL specimens only), is one component of a comprehensive MRSA colonization surveillance program. It is not intended to diagnose MRSA infection nor to guide or monitor treatment for MRSA infections. Test performance is not FDA approved in patients less than 58 years old. Performed at [facility], [address] [place], [place] [address]      Radiology Studies: No results found.   LOS: 8 days   Lanae Boast, MD [facility]  10/28/2021, 1:17 PM

## 2021-10-29 DIAGNOSIS — J189 Pneumonia, unspecified organism: Secondary | ICD-10-CM | POA: Diagnosis not present

## 2021-10-29 LAB — BASIC METABOLIC PANEL WITH GFR
Anion gap: 6 (ref 5–15)
BUN: 16 mg/dL (ref 8–23)
CO2: 31 mmol/L (ref 22–32)
Calcium: 8.2 mg/dL — ABNORMAL LOW (ref 8.9–10.3)
Chloride: 104 mmol/L (ref 98–111)
Creatinine, Ser: 0.61 mg/dL (ref 0.61–1.24)
GFR, Estimated: >60 mL/min (ref >60)
Glucose, Bld: 122 mg/dL — ABNORMAL HIGH (ref 70–99)
Potassium: 4.1 mmol/L (ref 3.5–5.1)
Sodium: 141 mmol/L (ref 135–145)

## 2021-10-29 LAB — GLUCOSE, CAPILLARY
Glucose-Capillary: 104 mg/dL — ABNORMAL HIGH (ref 70–99)
Glucose-Capillary: 120 mg/dL — ABNORMAL HIGH (ref 70–99)
Glucose-Capillary: 124 mg/dL — ABNORMAL HIGH (ref 70–99)
Glucose-Capillary: 128 mg/dL — ABNORMAL HIGH (ref 70–99)
Glucose-Capillary: 134 mg/dL — ABNORMAL HIGH (ref 70–99)
Glucose-Capillary: 141 mg/dL — ABNORMAL HIGH (ref 70–99)
Glucose-Capillary: 145 mg/dL — ABNORMAL HIGH (ref 70–99)

## 2021-10-29 LAB — PROCALCITONIN: Procalcitonin: <0.1 ng/mL

## 2021-10-29 MED ORDER — TRAMADOL HCL 50 MG PO TABS
50.0000 mg | ORAL_TABLET | Freq: Two times a day (BID) | ORAL | Status: DC | PRN
  Administered 2021-10-29 – 2021-10-31 (×3): 50 mg via ORAL
  Filled 2021-10-29 (×3): qty 1

## 2021-10-29 MED ORDER — FREE WATER
150.0000 mL | Freq: Four times a day (QID) | Status: DC
  Administered 2021-10-29 – 2021-10-31 (×7): 150 mL

## 2021-10-29 MED ORDER — AMOXICILLIN-POT CLAVULANATE 875-125 MG PO TABS: 1.0000 | ORAL_TABLET | Freq: Two times a day (BID) | ORAL | Status: DC

## 2021-10-29 MED ORDER — JEVITY 1.2 CAL PO LIQD
1000.0000 mL | ORAL | Status: DC
Start: 2021-10-29 — End: 2021-10-31
  Administered 2021-10-29 – 2021-10-31 (×4): 1000 mL
  Filled 2021-10-29 (×4): qty 1000

## 2021-10-29 MED ORDER — PROSOURCE TF PO LIQD
45.0000 mL | Freq: Every day | ORAL | Status: DC
  Administered 2021-10-29 – 2021-10-31 (×3): 45 mL
  Filled 2021-10-29 (×3): qty 45

## 2021-10-29 MED ORDER — AMOXICILLIN-POT CLAVULANATE 400-57 MG/5ML PO SUSR
10.0000 mL | Freq: Two times a day (BID) | ORAL | Status: DC
  Administered 2021-10-29 – 2021-10-30 (×3): 10 mL
  Filled 2021-10-29 (×5): qty 10

## 2021-10-29 NOTE — Progress Notes (Signed)
PROGRESS NOTE    [name] [name]  CLE:[id number] DOB: 1935/09/30 DOA: 10/20/2021 PCP: [name] Dupes, MD   Chief Complaint  Patient presents with   Aspiration   Brief Narrative/Hospital Course:  [name] [name], 86 y.o. male with PMH of dementia, seizure disorder, BPH, GI bleeding brought to the ED with sudden onset of shortness of breath and rhonchi noted at  [facility] where he is a long-term resident.   In ED, found to have bilateral lower lobe pneumonia, COVID-positive, was previously positive in May and had negative test subsequently.   Is admitted and is being treated for aspiration pneumonia as well as COVID-19, hypoxic respiratory failure debility weakness and dysphagia. He has bilateral heel deep tissue injury POA and managed with wound care Remains on NG tube feeding speech is followed closely including palliative care Mentation seems to be more alert awake Patient placed on dysphagia 2 diet 11/3  Subjective:  He is alert awake oriented to self, knows he is in hospital, unable to tell me president name or date. Follows intructions. Says not really hungry.Coughing some when speaking with phlegm. On NGT+ Overnight afebrile, labs are stable procalcitonin reassuring less than 0.1 patient placed on dysphagia 2 diet  Assessment & Plan:  Bilateral lower lobe pneumonia likely aspiration pneumonia: Patient symptomatic  w/ cough, severe weakness debility.  Repeat chest x-ray 10/31 with worsening infiltrates suspecting ongoing aspiration.  On NGT feeding, had a speech eval and placed on dysphagia 2 diet.  Encourage oral intake.peech following.  Leukocytosis improved procalcitonin reassuring, switched to Unasyn - will complete total 10 days course . Continue chest PT, I-S, deep breathing exercises, sputum induction mucolytic's bronchodilators and on the supportive measures.   Currently afebrile and leukocytosis improved, procalcitonin stable < 0.1.   COVID-19 infection:Symptomatic with  severe weakness/deconditioning and along with  bacterial pneumonia. Completed COVID directed therapy, D-dimer was trending down duplex negative for DVT.  Continue symptomatic management isolation, supportive care.  Seizure disorder stable continue Depakote and Keppra.  Continue seizure precaution supportive care.  Dementia: Likely with advanced dementia, long-term NH resident.  Continue seizure medication, Aricept, melatonin Risperdal.  Continue supportive care, delirium precaution ambulate with PT OT.   Essential hypertension: bp is stable, on midodrine at facility currently on hold.  Generalized deconditioning/debility/failure to thrive: In the setting of COVID-19 infection, bacterial pneumonia multiple comorbidities dementia.  Overall prognosis appears guarded, slow to improve palliative care following remains full code.  Goals of care: Currently full code at this time prognosis appears guarded, palliative care is following closely, given his advancing dementia and risk of aspiration and now  w/ pneumonia.  Denies starting diet, continue tube feeding.  Screen for LTACH.  BPH Flomax as able  Consult wound care for heel.  Bilateral deep tissue pressure injuries to heels POA.  Pressure versus perfusion wound , continue redistribution  heel boot, topical care Xeroform dressing dry gauze.  Monitor.  DVT prophylaxis: enoxaparin (LOVENOX) injection 40 mg Start: 10/21/21 1000 Code Status:   Code Status: Full Code Family Communication: plan of care discussed with patient and nursing staff at bedside. Status is: Inpatient Remains inpatient appropriate because: For ongoing management of aspiration pneumonia, dysphagia, severe debility and weakness.  Prognosis remains guarded,palliative care following closely.   Objective: Vitals last 24 hrs: Vitals:   10/28/21 2012 10/29/21 0026 10/29/21 0331 10/29/21 0500  BP: (!) 179/82 139/88 110/60   Pulse: 60  64   Resp: 18  18   Temp: (!) 97.4 F (36.3  C)  97.9 F (36.6 C)   TempSrc: Axillary  Axillary   SpO2: 100%  100%   Weight:    84.9 kg  Height:       Weight change: 1.2 kg  Intake/Output Summary (Last 24 hours) at 10/29/2021 1113 Last data filed at 10/29/2021 0350 Gross per 24 hour  Intake 1067 ml  Output 1400 ml  Net -333 ml   Net IO Since Admission: 2,910.39 mL [10/29/21 1113]   Physical Examination: General exam: AAOx 2,older than stated age, weak appearing. HEENT:Oral mucosa moist, Ear/Nose WNL grossly, dentition normal. Respiratory system: bilaterally clear, no use of accessory muscle Cardiovascular system: S1 & S2 +, No JVD,. Gastrointestinal system: Abdomen soft, NT,ND, BS+ Nervous System:Alert, awake, able to tell me his name date of birth , severe generalized weakness  Extremities: Mild leg edema, distal peripheral pulses palpable.  Skin: No rashes,no icterus. MSK: Normal muscle bulk,tone, power   Medications reviewed:  Scheduled Meds:  brinzolamide  1 drop Both Eyes TID   chlorhexidine  15 mL Mouth Rinse BID   Chlorhexidine Gluconate Cloth  6 each Topical Q0600   donepezil  10 mg Per Tube QHS   enoxaparin (LOVENOX) injection  40 mg Subcutaneous Q24H   latanoprost  1 drop Both Eyes QHS   levETIRAcetam  500 mg Oral BID   mouth rinse  15 mL Mouth Rinse q12n4p   memantine  10 mg Per Tube BID   mupirocin ointment  1 application Nasal BID   risperiDONE  0.5 mg Per Tube QHS   valproic acid  500 mg Per Tube BID   Continuous Infusions:  sodium chloride Stopped (10/27/21 0120)   ampicillin-sulbactam (UNASYN) IV 3 g (10/29/21 0334)   feeding supplement (JEVITY 1.2 CAL) 60 mL/hr at 10/29/21 0200     Diet Order             DIET DYS 2 Room service appropriate? Yes with Assist; Fluid consistency: Honey Thick  Diet effective now                   Nutrition Problem: Inadequate oral intake Etiology: inability to eat Signs/Symptoms: NPO status Interventions: Tube feeding  Weight change: 1.2 kg  Wt  Readings from Last 3 Encounters:  10/29/21 84.9 kg  05/15/21 72 kg  05/12/21 72.9 kg     Consultants:see note  Procedures:see note Antimicrobials: Anti-infectives (From admission, onward)    Start     Dose/Rate Route Frequency Ordered Stop   10/28/21 1600  Ampicillin-Sulbactam (UNASYN) 3 g in sodium chloride 0.9 % 100 mL IVPB        3 g 200 mL/hr over 30 Minutes Intravenous Every 6 hours 10/28/21 1243     10/22/21 1000  remdesivir 100 mg in sodium chloride 0.9 % 100 mL IVPB       See Hyperspace for full Linked Orders Report.   100 mg 200 mL/hr over 30 Minutes Intravenous Daily 10/21/21 0238 10/25/21 1030   10/21/21 0800  piperacillin-tazobactam (ZOSYN) IVPB 3.375 g  Status:  Discontinued        3.375 g 12.5 mL/hr over 240 Minutes Intravenous Every 8 hours 10/21/21 0240 10/28/21 1243   10/21/21 0300  remdesivir 200 mg in sodium chloride 0.9% 250 mL IVPB       See Hyperspace for full Linked Orders Report.   200 mg 580 mL/hr over 30 Minutes Intravenous Once 10/21/21 0238 10/21/21 0536   10/20/21 2200  piperacillin-tazobactam (ZOSYN) IVPB 3.375 g  3.375 g 100 mL/hr over 30 Minutes Intravenous  Once 10/20/21 2150 10/21/21 0303      Culture/Microbiology    Component Value Date/Time   SDES BLOOD SITE NOT SPECIFIED 10/20/2021 2029   SDES IN/OUT CATH URINE 10/20/2021 2029   SPECREQUEST  10/20/2021 2029    BOTTLES DRAWN AEROBIC AND ANAEROBIC Blood Culture results may not be optimal due to an inadequate volume of blood received in culture bottles   SPECREQUEST  10/20/2021 2029    NONE Performed at [facility], [address], [place], [place] [address]    CULT  10/20/2021 2029    NO GROWTH 5 DAYS Performed at [facility], [address] [place], [place] [address]    CULT MULTIPLE SPECIES PRESENT, SUGGEST RECOLLECTION (A) 10/20/2021 2029   REPTSTATUS 10/25/2021 FINAL 10/20/2021 2029   REPTSTATUS 10/21/2021 FINAL 10/20/2021 2029    Other culture-see  note  Unresulted Labs (From admission, onward)     Start     Ordered   10/29/21 0500  Procalcitonin  Daily,   R     Question:  Specimen collection method  Answer:  Lab=Lab collect   10/28/21 1140   10/29/21 0500  Basic metabolic panel  Daily,   R     Question:  Specimen collection method  Answer:  Lab=Lab collect   10/28/21 1140   10/22/21 0500  CBC with Differential/Platelet  Daily,   R      10/21/21 0238           Data Reviewed: I have personally reviewed following labs and imaging studies CBC: Recent Labs  Lab 10/23/21 0757 10/24/21 0116 10/26/21 0327 10/28/21 0309  WBC 5.5 5.1 7.7 6.3  NEUTROABS 3.3 2.9 4.9  --   HGB 13.2 11.1* 10.8* 10.6*  HCT 42.0 35.9* 34.9* 34.2*  MCV 87.1 87.3 88.4 88.4  PLT 195 177 224 335   Basic Metabolic Panel: Recent Labs  Lab 10/24/21 0116 10/25/21 0500 10/26/21 0327 10/28/21 0309 10/29/21 0235  NA 145 142 141 141 141  K 3.5 3.6 4.3 4.0 4.1  CL 110 106 109 104 104  CO2 30 29 25  32 31  GLUCOSE 119* 139* 134* 150* 122*  BUN 17 16 21 18 16   CREATININE 0.88 0.87 0.86 0.64 0.61  CALCIUM 8.4* 8.6* 8.1* 8.3* 8.2*   GFR: Estimated Creatinine Clearance: 77.7 mL/min (by C-G formula based on SCr of 0.61 mg/dL). Liver Function Tests: Recent Labs  Lab 10/23/21 0757 10/24/21 0116 10/25/21 0500 10/26/21 0327 10/28/21 0309  AST 40 30 28 27 22   ALT 30 25 25 20 19   ALKPHOS 72 71 73 57 54  BILITOT 0.8 0.8 0.5 0.5 0.3  PROT 5.9* 5.2* 5.6* 4.6* 4.8*  ALBUMIN 2.3* 2.0* 2.0* 1.7* 1.7*   No results for input(s): LIPASE, AMYLASE in the last 168 hours. No results for input(s): AMMONIA in the last 168 hours. Coagulation Profile: No results for input(s): INR, PROTIME in the last 168 hours.  Cardiac Enzymes: No results for input(s): CKTOTAL, CKMB, CKMBINDEX, TROPONINI in the last 168 hours. BNP (last 3 results) No results for input(s): PROBNP in the last 8760 hours. HbA1C: No results for input(s): HGBA1C in the last 72  hours. CBG: Recent Labs  Lab 10/28/21 1618 10/28/21 1956 10/29/21 0051 10/29/21 0329 10/29/21 0829  GLUCAP 100* 138* 120* 124* 141*   Lipid Profile: No results for input(s): CHOL, HDL, LDLCALC, TRIG, CHOLHDL, LDLDIRECT in the last 72 hours. Thyroid Function Tests: No  results for input(s): TSH, T4TOTAL, FREET4, T3FREE, THYROIDAB in the last 72 hours. Anemia Panel: No results for input(s): VITAMINB12, FOLATE, FERRITIN, TIBC, IRON, RETICCTPCT in the last 72 hours. Sepsis Labs: Recent Labs  Lab 10/28/21 0309 10/29/21 0235  PROCALCITON <0.10 <0.10    Recent Results (from the past 240 hour(s))  Blood culture (routine single)     Status: None   Collection Time: 10/20/21  8:29 PM   Specimen: BLOOD  Result Value Ref Range Status   Specimen Description BLOOD SITE NOT SPECIFIED  Final   Special Requests   Final    BOTTLES DRAWN AEROBIC AND ANAEROBIC Blood Culture results may not be optimal due to an inadequate volume of blood received in culture bottles   Culture   Final    NO GROWTH 5 DAYS Performed at [facility], [address], [place], [place] [address]    Report Status 10/25/2021 FINAL  Final  Urine Culture     Status: Abnormal   Collection Time: 10/20/21  8:29 PM   Specimen: In/Out Cath Urine  Result Value Ref Range Status   Specimen Description IN/OUT CATH URINE  Final   Special Requests   Final    NONE Performed at [facility], [address], [place], [place] [address]    Culture MULTIPLE SPECIES PRESENT, SUGGEST RECOLLECTION (A)  Final   Report Status 10/21/2021 FINAL  Final  Resp Panel by RT-PCR (Flu A&B, Covid) Nasopharyngeal Swab     Status: Abnormal   Collection Time: 10/20/21  8:31 PM   Specimen: Nasopharyngeal Swab; Nasopharyngeal(NP) swabs in vial transport medium  Result Value Ref Range Status   SARS Coronavirus 2 by RT PCR POSITIVE (A) NEGATIVE Final    Comment: RESULT CALLED TO, READ BACK BY AND VERIFIED WITH: S NOLTE,RN@[id number]  10/20/21  MK (NOTE) SARS-CoV-2 target nucleic acids are DETECTED.  The SARS-CoV-2 RNA is generally detectable in upper respiratory specimens during the acute phase of infection. Positive results are indicative of the presence of the identified virus, but do not rule out bacterial infection or co-infection with other pathogens not detected by the test. Clinical correlation with patient history and other diagnostic information is necessary to determine patient infection status. The expected result is Negative.  Fact Sheet for Patients: BloggerCourse.com  Fact Sheet for Healthcare Providers: SeriousBroker.it  This test is not yet approved or cleared by the [place] FDA and  has been authorized for detection and/or diagnosis of SARS-CoV-2 by FDA under an Emergency Use Authorization (EUA).  This EUA will remain in effect (meaning this test can be used)  for the duration of  the COVID-19 declaration under Section 564(b)(1) of the Act, 21 U.S.C. section 360bbb-3(b)(1), unless the authorization is terminated or revoked sooner.     Influenza A by PCR NEGATIVE NEGATIVE Final   Influenza B by PCR NEGATIVE NEGATIVE Final    Comment: (NOTE) The Xpert Xpress SARS-CoV-2/FLU/RSV plus assay is intended as an aid in the diagnosis of influenza from Nasopharyngeal swab specimens and should not be used as a sole basis for treatment. Nasal washings and aspirates are unacceptable for Xpert Xpress SARS-CoV-2/FLU/RSV testing.  Fact Sheet for Patients: BloggerCourse.com  Fact Sheet for Healthcare Providers: SeriousBroker.it  This test is not yet approved or cleared by the [place] FDA and has been authorized for detection and/or diagnosis of SARS-CoV-2 by FDA under an Emergency Use Authorization (EUA). This EUA will remain in effect (meaning this test can be used) for the duration  of the COVID-19  declaration under Section 564(b)(1) of the Act, 21 U.S.C. section 360bbb-3(b)(1), unless the authorization is terminated or revoked.  Performed at [facility], [address] [place], [place] [address]   MRSA Next Gen by PCR, Nasal     Status: Abnormal   Collection Time: 10/25/21 12:34 PM   Specimen: Nasal Mucosa; Nasal Swab  Result Value Ref Range Status   MRSA by PCR Next Gen DETECTED (A) NOT DETECTED Final    Comment: RESULT CALLED TO, READ BACK BY AND VERIFIED WITH: Ulyses Southward RN [id number] 10/25/21 A BROWNING (NOTE) The GeneXpert MRSA Assay (FDA approved for NASAL specimens only), is one component of a comprehensive MRSA colonization surveillance program. It is not intended to diagnose MRSA infection nor to guide or monitor treatment for MRSA infections. Test performance is not FDA approved in patients less than 39 years old. Performed at [facility], [address], [place], [place] [address]      Radiology Studies: DG Swallowing Func-Speech Pathology  Result Date: 10/28/2021 Table formatting from the original result was not included. Objective Swallowing Evaluation: Type of Study: MBS-Modified Barium Swallow Study  Patient Details Name: [name] [name] MRN: [id number] Date of Birth: [birth year] Today's Date: 10/28/2021 Time: SLP Start Time (ACUTE ONLY): 1400 -SLP Stop Time (ACUTE ONLY): 1420 SLP Time Calculation (min) (ACUTE ONLY): 20 min Past Medical History: Past Medical History: Diagnosis Date  Allergy   Dementia (HCC)   sees Dr. Patrcia Dolly   Diverticulosis of colon (without mention of hemorrhage) 03-01-2004, 04-04-2011  Colonoscopy  ED (erectile dysfunction)   Glaucoma   sees Dr. Arvil Chaco   Hyperglycemia   Hyperlipidemia   Hypertension   Internal hemorrhoids 03-15-1999  Flex Sig   Irritable bowel  Past Surgical History: Past Surgical History: Procedure Laterality Date  COLONOSCOPY  04-04-11  per Dr. Jarold Motto, clear, no repeats needed  EYE SURGERY    bilateral cataract  extraction per Dr Jettie Pagan  INGUINAL HERNIA REPAIR    KNEE ARTHROSCOPY    right knee  NASAL SINUS SURGERY   HPI: Pt is an 86 y/o male admitted 10/26 from nursing home with respiratory distress/concern for aspiration. CXR 10/26: Bilateral lower lung airspace disease, left greater than right, concerning for infection. PMT 11/3: Family is open to all offered and available medical interventions to prolong life. PMH: dementia, seizures, BPH, GI bleed, HTN, HLD, hyperglycemia.  No data recorded Assessment / Plan / Recommendation CHL IP CLINICAL IMPRESSIONS 10/28/2021 Clinical Impression Pt presents with oropharyngeal dysphagia characterized by decreased bolus cohesion, a pharyngeal delay, reduced lingual retraction, and reduced anterior laryngeal movement. He demonstrate mild vallecular residue and moderate pyriform sinus residue. Residue increased with bolus size and with advancement of solid bolus consistency. Residue was eliminated with consecutive swallows of thin liquids. However, epiglottic inversion was inconsistent with liquids. Incomplete epiglottic inversion, combined with the presence of pyriform sinus residue, and reduced distension of the pharyngoesophageal segment resulted in penetration (PAS 5) and sensed aspiration (PAS 7) of thin liquids and nectar thick liquids. Coughing was effective in mobilizing penetrate and in expelling less severe (PAS 3) instances of trace penetration. Laryngeal invasion was eliminated with reduced bolus sizes of nectar thick liquids via cup and with cued secondary swallows; however, pt demonstrated difficulty with consistency of the latter. Pt exhibited impulsive tendencies which places him at increased risk for aspiration of liquids given his pharyngeal delay. A dysphagia 2 diet with honey thick liquids will be initiated at this  time. SLP will follow for dysphagia treatment, and advancement as clinically indicated. SLP Visit Diagnosis Dysphagia, oropharyngeal phase (R13.12) Attention  and concentration deficit following -- Frontal lobe and executive function deficit following -- Impact on safety and function Moderate aspiration risk   CHL IP TREATMENT RECOMMENDATION 10/28/2021 Treatment Recommendations Therapy as outlined in treatment plan below   Prognosis 10/28/2021 Prognosis for Safe Diet Advancement Fair Barriers to Reach Goals Severity of deficits;Cognitive deficits Barriers/Prognosis Comment -- CHL IP DIET RECOMMENDATION 10/28/2021 SLP Diet Recommendations Dysphagia 2 (Fine chop) solids;Honey thick liquids Liquid Administration via Cup;No straw Medication Administration Via alternative means Compensations Minimize environmental distractions;Slow rate;Small sips/bites;Follow solids with liquid Postural Changes Seated upright at 90 degrees   CHL IP OTHER RECOMMENDATIONS 10/28/2021 Recommended Consults -- Oral Care Recommendations Staff/trained caregiver to provide oral care;Oral care BID Other Recommendations --   CHL IP FOLLOW UP RECOMMENDATIONS 10/28/2021 Follow up Recommendations Skilled Nursing facility   [facility] IP FREQUENCY AND DURATION 10/28/2021 Speech Therapy Frequency (ACUTE ONLY) min 2x/week Treatment Duration 2 weeks      CHL IP ORAL PHASE 10/28/2021 Oral Phase Impaired Oral - Pudding Teaspoon -- Oral - Pudding Cup -- Oral - Honey Teaspoon -- Oral - Honey Cup Premature spillage;Decreased bolus cohesion Oral - Nectar Teaspoon -- Oral - Nectar Cup Premature spillage;Decreased bolus cohesion Oral - Nectar Straw Premature spillage;Decreased bolus cohesion Oral - Thin Teaspoon NT Oral - Thin Cup Premature spillage;Decreased bolus cohesion Oral - Thin Straw Premature spillage;Decreased bolus cohesion Oral - Puree WFL Oral - Mech Soft -- Oral - Regular WFL Oral - Multi-Consistency -- Oral - Pill (No Data) Oral Phase - Comment --  CHL IP PHARYNGEAL PHASE 10/28/2021 Pharyngeal Phase Impaired Pharyngeal- Pudding Teaspoon -- Pharyngeal -- Pharyngeal- Pudding Cup -- Pharyngeal -- Pharyngeal- Honey  Teaspoon NT Pharyngeal -- Pharyngeal- Honey Cup Pharyngeal residue - pyriform;Pharyngeal residue - valleculae;Reduced laryngeal elevation;Reduced anterior laryngeal mobility;Reduced tongue base retraction Pharyngeal -- Pharyngeal- Nectar Teaspoon NT Pharyngeal -- Pharyngeal- Nectar Cup Pharyngeal residue - pyriform;Pharyngeal residue - valleculae;Reduced laryngeal elevation;Reduced anterior laryngeal mobility;Reduced tongue base retraction;Penetration/Aspiration during swallow;Penetration/Apiration after swallow Pharyngeal Material enters airway, passes BELOW cords and not ejected out despite cough attempt by patient;Material enters airway, CONTACTS cords and not ejected out Pharyngeal- Nectar Straw Pharyngeal residue - pyriform;Pharyngeal residue - valleculae;Reduced laryngeal elevation;Reduced anterior laryngeal mobility;Reduced tongue base retraction;Penetration/Aspiration during swallow;Penetration/Apiration after swallow Pharyngeal Material enters airway, passes BELOW cords and not ejected out despite cough attempt by patient Pharyngeal- Thin Teaspoon NT Pharyngeal -- Pharyngeal- Thin Cup Pharyngeal residue - pyriform;Pharyngeal residue - valleculae;Reduced laryngeal elevation;Reduced anterior laryngeal mobility;Reduced tongue base retraction;Moderate aspiration;Penetration/Apiration after swallow;Penetration/Aspiration during swallow Pharyngeal -- Pharyngeal- Thin Straw Pharyngeal residue - pyriform;Pharyngeal residue - valleculae;Reduced laryngeal elevation;Reduced anterior laryngeal mobility;Reduced tongue base retraction;Penetration/Aspiration during swallow;Penetration/Apiration after swallow Pharyngeal Material enters airway, passes BELOW cords and not ejected out despite cough attempt by patient;Material enters airway, CONTACTS cords and not ejected out Pharyngeal- Puree Pharyngeal residue - pyriform;Pharyngeal residue - valleculae;Reduced laryngeal elevation;Reduced anterior laryngeal mobility;Reduced  tongue base retraction Pharyngeal -- Pharyngeal- Mechanical Soft -- Pharyngeal -- Pharyngeal- Regular Pharyngeal residue - pyriform;Pharyngeal residue - valleculae;Reduced laryngeal elevation;Reduced anterior laryngeal mobility;Reduced tongue base retraction Pharyngeal -- Pharyngeal- Multi-consistency -- Pharyngeal -- Pharyngeal- Pill -- Pharyngeal -- Pharyngeal Comment --  CHL IP CERVICAL ESOPHAGEAL PHASE 10/28/2021 Cervical Esophageal Phase -- Pudding Teaspoon -- Pudding Cup -- Honey Teaspoon -- Honey Cup -- Nectar Teaspoon -- Nectar Cup -- Nectar Straw -- Thin Teaspoon -- Thin Cup Reduced cricopharyngeal relaxation Thin Straw -- Puree --  Mechanical Soft -- Regular -- Multi-consistency -- Pill -- Cervical Esophageal Comment -- Yvone Neu I. [name], MS, CCC-SLP Acute Rehabilitation Services Office number [phone] Pager [phone] [name] 10/28/2021, 4:12 PM                LOS: 9 days   Lanae Boast, MD [facility]  10/29/2021, 11:13 AM

## 2021-10-29 NOTE — Progress Notes (Signed)
Nutrition Follow-up  DOCUMENTATION CODES:   Not applicable  INTERVENTION:   Encourage good PO intake  TF via Cortrak: Increase rate of Jevity 1.2 to 70 mL/hr (1680 mL/day) 45 mL ProSource TF - Daily 150 mL water flushes q6h  Provides 2056 kcal, 104 gram of Protein, 1356 mL free water (1956 mL total of free water) Continue Cortrak feeds until pt is able to meet >60% of his needs via PO.   NUTRITION DIAGNOSIS:   Inadequate oral intake related to inability to eat as evidenced by NPO status. - Progressing  GOAL:   Patient will meet greater than or equal to 90% of their needs - Progressing  MONITOR:   PO intake, Supplement acceptance, TF tolerance, I & O's, Weight trends  REASON FOR ASSESSMENT:   Consult Enteral/tube feeding initiation and management  ASSESSMENT:   86 year old with dementia, seizure disorder, BPH, GI bleeding presents with sudden onset of shortness of breath and rhonchi noted at nursing home. Found to have bilateral lower lobe pneumonia as well as positive COVID-19.  10/29 - Failed bedside swallow; NGT placed, TF initiated 10/31 - NGT removed; Cortrak Placed (tip in distal stomach) 11/03 - MBS - diet advanced to Dys 3, Honey Thick Liquids  Pt did not wake to PLDN voice or touch.   Per EMR, pt intake includes 50% of dinner on 11/3 and 90% of breakfast on 11/4. Tube feed running at goal rate.  Palliative care following pt and discussing with family GOC for pt.   Plan is for pt to return to SNF at discharge.  Medications reviewed and include: IV antibiotics Labs reviewed: 24 hr BG trends: 100-141  Admission Weight: 72.6 kg (Appears to be stated) Current Weight: 85.9 kg  Diet Order:   Diet Order             DIET DYS 2 Room service appropriate? No; Fluid consistency: Honey Thick  Diet effective now                   EDUCATION NEEDS:   No education needs have been identified at this time  Skin:  Skin Assessment: Skin Integrity  Issues: Skin Integrity Issues:: DTI DTI: L&R Heels  Last BM:  10/29/2021  Height:   Ht Readings from Last 1 Encounters:  10/21/21 6\' 1"  (1.854 m)    Weight:   Wt Readings from Last 1 Encounters:  10/29/21 84.9 kg    Ideal Body Weight:  83.63 kg  BMI:  Body mass index is 24.69 kg/m.  Estimated Nutritional Needs:   Kcal:  2000-2200  Protein:  100-115 grams  Fluid:  > 2 L    Rejoice Heatwole BS, PLDN Clinical Dietitian See AMiON for contact information.

## 2021-10-29 NOTE — TOC Progression Note (Signed)
Transition of Care [facility]) - Progression Note    Patient Details  Name: [name] [name] MRN: [id number] Date of Birth: 1935/05/16  Transition of Care [facility]) CM/SW Contact  Beckie Busing, RN Phone Number:[phone]  10/29/2021, 12:19 PM  Clinical Narrative:    CM called wife to follow up on message that wife does not want patient to return to [place]. Per wife her son is the one that doesn't want the patient to return to [place]. Wife states that she understands that it is best to leave him where he is for now. CM has explained that patient is long term care and can return to [place] and if family feels that they want to change in the future they are able to work with [facility] on placement. Wife verbalized understanding and is ok with patient being d/c back to [place] when medically stable.   Expected Discharge Plan: Skilled Nursing Facility Barriers to Discharge: Continued Medical Work up  Expected Discharge Plan and Services Expected Discharge Plan: Skilled Nursing Facility In-house Referral: NA Discharge Planning Services: CM Consult Post Acute Care Choice: NA Living arrangements for the past 2 months: Skilled Nursing Facility                 DME Arranged: N/A DME Agency: NA       HH Arranged: NA HH Agency: NA         Social Determinants of Health (SDOH) Interventions    Readmission Risk Interventions Readmission Risk Prevention Plan 10/22/2021  Transportation Screening Complete  Medication Review [facility]) Referral to Pharmacy  PCP or Specialist appointment within 3-5 days of discharge Complete  HRI or Home Care Consult Not Complete  HRI or Home Care Consult Pt Refusal Comments from SNF  SW Recovery Care/Counseling Consult Complete  Palliative Care Screening Not Applicable  Skilled Nursing Facility Complete  Some recent data might be hidden

## 2021-10-29 NOTE — Progress Notes (Signed)
Speech Language Pathology Treatment: Dysphagia  Patient Details Name: [name] [name] MRN: [id number] DOB: [birth year] Today's Date: 10/29/2021 Time: 6433-2951 SLP Time Calculation (min) (ACUTE ONLY): 18 min  Assessment / Plan / Recommendation Clinical Impression  Pt was seen during breakfast for dysphagia treatment. Amrah, RN reported that the pt has been tolerating the p.o. diet without overt s/sx of aspiration, but that his baseline cough continues. He was alert and cooperative during the session. Pt consumed a meal of ground sausage, scrambled eggs, breakfast potatoes, grits, and honey thick liquids. Pt required cues to reduce intake rate. Pt stated, "I'm sorry, I didn't realize how quickly I was going" in response to cues and intake rate was reduced thereafter. Bolus sizes were only adequate since portions of larger boluses fell of the spoon prior to them entering the oral cavity. He tolerated solids and liquids without overt s/sx of aspiration. Mastication was [facility] and oral clearance functional. His current diet of dysphagia 3 solids and honey thick liquids will be continued; full supervision will be necessary to ensure reduced bolus sizes and intake rate. SLP will continue to follow pt.     HPI HPI: Pt is an 86 y/o male admitted 10/26 from nursing home with respiratory distress/concern for aspiration. CXR 10/26: Bilateral lower lung airspace disease, left greater than right, concerning for infection. PMH: dementia, seizures, BPH, GI bleed, HTN, HLD, hyperglycemia.      SLP Plan  Continue with current plan of care      Recommendations for follow up therapy are one component of a multi-disciplinary discharge planning process, led by the attending physician.  Recommendations may be updated based on patient status, additional functional criteria and insurance authorization.    Recommendations  Diet recommendations: Dysphagia 2 (fine chop);Honey-thick liquid Liquids provided via: Cup;No  straw Medication Administration: Via alternative means (or crushed in puree) Supervision: Full supervision/cueing for compensatory strategies Compensations: Minimize environmental distractions;Slow rate;Small sips/bites;Effortful swallow;Follow solids with liquid Postural Changes and/or Swallow Maneuvers: Seated upright 90 degrees                Oral Care Recommendations: Staff/trained caregiver to provide oral care;Oral care BID Follow up Recommendations: Skilled Nursing facility SLP Visit Diagnosis: Dysphagia, oropharyngeal phase (R13.12) Plan: Continue with current plan of care       Eri Mcevers I. [name], MS, CCC-SLP Acute Rehabilitation Services Office number [phone] Pager [phone]                 [name]  10/29/2021, 10:20 AM

## 2021-10-29 NOTE — Progress Notes (Signed)
Loss of IV access, IV team consulted they were unable to obtain IV access. Bilateral upper extremities edematous. MD made aware and changed to oral.   Pt. Also complaining of pain, PRN tylenol given, unchanged MD made aware see new orders.

## 2021-10-29 NOTE — Progress Notes (Signed)
Physical Therapy Treatment Patient Details Name: [name] [name] MRN: [id number] DOB: 1935/11/26 Today's Date: 10/29/2021   History of Present Illness 86 y/o male admitted 10/26 in respiratory distress with rhonchi and coughing, concern for aspiration.  Pt was initially requiring 2L O2 to maintain sats.  Covid check on admission came back positive.  PMHx:  dementia, glaucoma, HTN,    PT Comments    Patient received in bed, gown mostly off, asking for wash cloth. Patient confused. Agrees to PT session. Does not initiate much movement upon direction. He will wiggle toes and point and flex feet. Requires assistance to perform all other LE and UE exercises and moaning/yelling out in pain with minimal movement. Patient with limited ROM in all joints and is total assist for bed mobility.  Patient may be at baseline, as it appears he has not been ambulatory recently. He will continue to benefit from skilled PT while here to maximize independence and reduce caregiver burden.     Recommendations for follow up therapy are one component of a multi-disciplinary discharge planning process, led by the attending physician.  Recommendations may be updated based on patient status, additional functional criteria and insurance authorization.  Follow Up Recommendations  Long-term institutional care without follow-up therapy     Assistance Recommended at Discharge Frequent or constant Supervision/Assistance  Equipment Recommendations  None recommended by PT    Recommendations for Other Services       Precautions / Restrictions Precautions Precautions: Fall Restrictions Weight Bearing Restrictions: No     Mobility  Bed Mobility Overal bed mobility: Needs Assistance Bed Mobility: Supine to Sit;Sit to Supine     Supine to sit: Total assist Sit to supine: Total assist   General bed mobility comments: patient is very stiff with any movement of all extremities.    Transfers                    General transfer comment: unable to attempt. Patient total assist for bed mobility    Ambulation/Gait             General Gait Details: not assessed   Stairs             Wheelchair Mobility    Modified Rankin (Stroke Patients Only)       Balance Overall balance assessment: Needs assistance Sitting-balance support: Feet supported;Bilateral upper extremity supported Sitting balance-Leahy Scale: Zero Sitting balance - Comments: heavy posterior leaning requiring posterior support                                    Cognition Arousal/Alertness: Awake/alert Behavior During Therapy: Flat affect Overall Cognitive Status: History of cognitive impairments - at baseline                                          Exercises Other Exercises Other Exercises: Patient requires max assist with all LE and UE exercises. He is very stiff and painful with all mobility.  Limited rom in all joints.    General Comments        Pertinent Vitals/Pain Pain Assessment: PAINAD Faces Pain Scale: Hurts little more Breathing: normal Negative Vocalization: repeated troubled calling out, loud moaning/groaning, crying Facial Expression: facial grimacing Body Language: tense, distressed pacing, fidgeting Consolability: distracted or reassured by voice/touch PAINAD Score: 6  Pain Location: Generalized with any mobility Pain Descriptors / Indicators: Grimacing;Guarding;Moaning Pain Intervention(s): Monitored during session;Repositioned;Limited activity within patient's tolerance    Home Living                          Prior Function            PT Goals (current goals can now be found in the care plan section) Acute Rehab PT Goals PT Goal Formulation: Patient unable to participate in goal setting Time For Goal Achievement: 11/06/21 Progress towards PT goals: Not progressing toward goals - comment (pain limited, poor initiation of movement,  confusion)    Frequency    Min 2X/week      PT Plan Current plan remains appropriate    Co-evaluation              AM-PAC PT "6 Clicks" Mobility   Outcome Measure  Help needed turning from your back to your side while in a flat bed without using bedrails?: A Lot Help needed moving from lying on your back to sitting on the side of a flat bed without using bedrails?: Total Help needed moving to and from a bed to a chair (including a wheelchair)?: Total Help needed standing up from a chair using your arms (e.g., wheelchair or bedside chair)?: Total Help needed to walk in hospital room?: Total Help needed climbing 3-5 steps with a railing? : Total 6 Click Score: 7    End of Session Equipment Utilized During Treatment: Oxygen Activity Tolerance: Patient limited by pain Patient left: in bed;with call bell/phone within reach;with bed alarm set Nurse Communication: Mobility status PT Visit Diagnosis: Other abnormalities of gait and mobility (R26.89);Muscle weakness (generalized) (M62.81);Pain Pain - Right/Left:  (generalized pain)     Time: 1037-1100 PT Time Calculation (min) (ACUTE ONLY): 23 min  Charges:  $Therapeutic Exercise: 8-22 mins $Therapeutic Activity: 8-22 mins                     Sybella Harnish, PT, GCS 10/29/21,11:13 AM

## 2021-10-30 DIAGNOSIS — F01518 Vascular dementia, unspecified severity, with other behavioral disturbance: Secondary | ICD-10-CM | POA: Diagnosis not present

## 2021-10-30 DIAGNOSIS — J189 Pneumonia, unspecified organism: Secondary | ICD-10-CM | POA: Diagnosis not present

## 2021-10-30 DIAGNOSIS — U071 COVID-19: Secondary | ICD-10-CM | POA: Diagnosis not present

## 2021-10-30 DIAGNOSIS — E785 Hyperlipidemia, unspecified: Secondary | ICD-10-CM

## 2021-10-30 LAB — BASIC METABOLIC PANEL
Anion gap: 7 (ref 5–15)
BUN: 19 mg/dL (ref 8–23)
CO2: 32 mmol/L (ref 22–32)
Calcium: 8.2 mg/dL — ABNORMAL LOW (ref 8.9–10.3)
Chloride: 100 mmol/L (ref 98–111)
Creatinine, Ser: 0.71 mg/dL (ref 0.61–1.24)
GFR, Estimated: >60 mL/min (ref >60)
Glucose, Bld: 121 mg/dL — ABNORMAL HIGH (ref 70–99)
Potassium: 5.1 mmol/L (ref 3.5–5.1)
Sodium: 139 mmol/L (ref 135–145)

## 2021-10-30 LAB — GLUCOSE, CAPILLARY
Glucose-Capillary: 114 mg/dL — ABNORMAL HIGH (ref 70–99)
Glucose-Capillary: 93 mg/dL (ref 70–99)
Glucose-Capillary: 101 mg/dL — ABNORMAL HIGH (ref 70–99)
Glucose-Capillary: 95 mg/dL (ref 70–99)
Glucose-Capillary: 122 mg/dL — ABNORMAL HIGH (ref 70–99)

## 2021-10-30 MED ORDER — GUAIFENESIN-DM 100-10 MG/5ML PO SYRP
5.0000 mL | ORAL_SOLUTION | ORAL | Status: DC | PRN
  Administered 2021-10-30 – 2021-10-31 (×2): 5 mL via ORAL
  Filled 2021-10-30 (×2): qty 5

## 2021-10-30 NOTE — Plan of Care (Signed)
  Problem: Health Behavior/Discharge Planning: Goal: Ability to manage health-related needs will improve Outcome: Progressing   

## 2021-10-30 NOTE — Plan of Care (Signed)
  Problem: Health Behavior/Discharge Planning: Goal: Ability to manage health-related needs will improve Outcome: Progressing   Problem: Clinical Measurements: Goal: Diagnostic test results will improve Outcome: Progressing   Problem: Nutrition: Goal: Adequate nutrition will be maintained Outcome: Progressing   

## 2021-10-30 NOTE — Progress Notes (Signed)
Occupational Therapy Treatment Patient Details Name: [name] [name] MRN: [id number] DOB: [birth year] Today's Date: 10/30/2021   History of present illness 86 y/o male admitted 10/26 in respiratory distress with rhonchi and coughing, concern for aspiration.  Pt was initially requiring 2L O2 to maintain sats.  Covid check on admission came back positive.  PMHx:  dementia, glaucoma, HTN,   OT comments  Patient received in bed.  Patient performed grooming at bed level with washing face and hands with assistance to initiate/participate.  Stiffness noted at BUE shoulders and elbows.  AAROM performed to address stiffness. Acute OT to continue to follow.    Recommendations for follow up therapy are one component of a multi-disciplinary discharge planning process, led by the attending physician.  Recommendations may be updated based on patient status, additional functional criteria and insurance authorization.    Follow Up Recommendations  Skilled nursing-short term rehab (<3 hours/day)    Assistance Recommended at Discharge Frequent or constant Supervision/Assistance  Equipment Recommendations  None recommended by OT    Recommendations for Other Services      Precautions / Restrictions Precautions Precautions: Fall Precaution Comments: bilat handmitts       Mobility Bed Mobility                    Transfers                         Balance                                           ADL either performed or assessed with clinical judgement   ADL Overall ADL's : Needs assistance/impaired     Grooming: Wash/dry hands;Wash/dry face;Bed level Grooming Details (indicate cue type and reason): washed face and hands with assistance to intiate                               General ADL Comments: light grooming performed at bed level     Vision       Perception     Praxis      Cognition Arousal/Alertness: Awake/alert Behavior  During Therapy: Flat affect Overall Cognitive Status: History of cognitive impairments - at baseline                                 General Comments: spoke of going outside to patio earlier today          Exercises Other Exercises Other Exercises: AAROM for shoulder flexion and extension for 10 reps Other Exercises: AAROM for elbow flexion and extension for 10 reps   Shoulder Instructions       General Comments      Pertinent Vitals/ Pain       Pain Assessment: Faces Pain Score: 0-No pain  Home Living                                          Prior Functioning/Environment              Frequency  Min 1X/week        Progress Toward Goals  OT Goals(current goals can  now be found in the care plan section)  Progress towards OT goals: Progressing toward goals  Acute Rehab OT Goals Patient Stated Goal: not stated OT Goal Formulation: Patient unable to participate in goal setting Time For Goal Achievement: 11/07/21 Potential to Achieve Goals: Fair ADL Goals Pt Will Perform Eating: with min assist;with adaptive utensils;sitting Pt Will Perform Grooming: with min assist;with adaptive equipment;sitting Pt Will Perform Upper Body Bathing: with min assist;sitting Pt Will Perform Upper Body Dressing: with min assist;sitting Additional ADL Goal #1: Pt will maintain balance EOB for 3 mins to prepare for seated ADL's.  Plan Discharge plan remains appropriate    Co-evaluation                 AM-PAC OT "6 Clicks" Daily Activity     Outcome Measure   Help from another person eating meals?: A Lot Help from another person taking care of personal grooming?: A Lot Help from another person toileting, which includes using toliet, bedpan, or urinal?: Total Help from another person bathing (including washing, rinsing, drying)?: Total Help from another person to put on and taking off regular upper body clothing?: A Lot Help from another  person to put on and taking off regular lower body clothing?: Total 6 Click Score: 9    End of Session Equipment Utilized During Treatment: Oxygen  OT Visit Diagnosis: Muscle weakness (generalized) (M62.81);Other abnormalities of gait and mobility (R26.89)   Activity Tolerance Patient limited by fatigue   Patient Left in bed;with call bell/phone within reach;with bed alarm set   Nurse Communication Mobility status        Time: 0301-3143 OT Time Calculation (min): 10 min  Charges: OT General Charges $OT Visit: 1 Visit OT Treatments $Self Care/Home Management : 8-22 mins  [name], OTA Acute Rehabilitation Services  Pager [phone] Office [phone]   [name] 10/30/2021, 3:23 PM

## 2021-10-30 NOTE — Progress Notes (Signed)
PROGRESS NOTE    [name] [name]  [id number] DOB: 12/21/1935 DOA: 10/20/2021 PCP: Raymondo Band, MD    Brief Narrative:  Mr. Shonk was admitted to the hospital with the working diagnosis of bilateral pneumonia.   86 year old male past medical history for dementia, seizures, BPH and gastrointestinal bleed who presented with respiratory distress.  At home patient having significant cough, concern about aspiration.  Recent SARS COVID-19 infection May 2022.  On his initial physical examination blood pressure 135/78, heart rate 97, respiratory 25, oxygen saturation 98%, his lungs had expiratory wheezing, no rales, heart S1-S2, present, rhythmic, soft abdomen, no lower extremity edema.  Patient responded to his name but not following commands.  Sodium 141, potassium 3.9, chloride 107, bicarb 27, glucose 125, BUN 27, creatinine 1.0, white count 4.7, hemoglobin 11.1, hematocrit 35.7, platelets 217. SARS COVID-19 positive  Urinalysis specific gravity 1.033, negative leukocytes.  Chest radiograph with bilateral lower lobe infiltrates.  EKG 99 bpm, left axis deviation, left anterior fascicular block, right bundle branch block, sinus rhythm, no significant ST segment or T wave changes.  Patient received antibiotic therapy and supplemental oxygen per nasal cannula. He did received COVID-specific therapy. Severe deconditioning and swallow dysfunction. Nasogastric tube was placed for feedings.  Prolonged hospitalization, slowly improving, now on dysphagia 2 diet.  Assessment & Plan:   Principal Problem:   Pneumonia Active Problems:   Dyslipidemia   Essential hypertension   Dementia (HCC)   Seizure ([place])   COVID   Bilateral lower lobe pneumonia, bacterial over infection in the setting of SARS COVID 19 viral infection.  Patient continue to have congestion, his oxymetry is 96% on room air.   Add as needed guaifenesin for congestion, continue with aspiration precautions and  oxymetry monitoring.  Patient on dysphagia 2 diet. Discontinue antibiotic therapy, course has been completed.  Today is day 10 of isolation, airborne isolation can be discontinued.   2. Dementia. Patient with confusion but not agitation.  Continue with memantine and risperidone.   3. HTN dyslipidemia. Continue blood pressure monitoring.  Patient not on statin therapy.,  4. Seizures. No active seizures, continue with valproic acid and keppra  5. Chronic anemia. Cell count is 10,6 with Hct at 34.,2  6. Stage 1 bilateral heels pressure injury stage 1. Not clear if present on admission.   Status is: Inpatient  Remains inpatient appropriate because: need close monitoring for recovering pneumonia.    DVT prophylaxis:  Enoxaparin   Code Status:    full  Family Communication:    I was not able to reach his son, I  left a message on his voicemail.    Subjective: Patient with no nausea or vomiting, NG tube in place, he has been confused but not agitated.   Objective: Vitals:   10/29/21 1508 10/29/21 1552 10/29/21 2113 10/30/21 0400  BP:   122/68 137/72  Pulse:   76 80  Resp:   18 16  Temp:   98.3 F (36.8 C) 98.2 F (36.8 C)  TempSrc:   Axillary Axillary  SpO2: 97% 97% 96%   Weight:    85.9 kg  Height:        Intake/Output Summary (Last 24 hours) at 10/30/2021 1301 Last data filed at 10/30/2021 1239 Gross per 24 hour  Intake 720 ml  Output 1000 ml  Net -280 ml   Filed Weights   10/28/21 0450 10/29/21 0500 10/30/21 0400  Weight: 83.7 kg 84.9 kg 85.9 kg    Examination:   General:  deconditioned and ill looking appearing  Neurology: Awake and alert, non focal  E ENT: positive pallor, no icterus, oral mucosa moist Cardiovascular: No JVD. S1-S2 present, rhythmic, no gallops, rubs, or murmurs. No lower extremity edema. Pulmonary: positive breath sounds bilaterally, with  no wheezing, but positive rhonchi with no rales. Gastrointestinal. Abdomen soft and non tender Skin.  No rashes Musculoskeletal: no joint deformities     Data Reviewed: I have personally reviewed following labs and imaging studies  CBC: Recent Labs  Lab 10/24/21 0116 10/26/21 0327 10/28/21 0309  WBC 5.1 7.7 6.3  NEUTROABS 2.9 4.9  --   HGB 11.1* 10.8* 10.6*  HCT 35.9* 34.9* 34.2*  MCV 87.3 88.4 88.4  PLT 177 224 [id number]   Basic Metabolic Panel: Recent Labs  Lab 10/24/21 0116 10/25/21 0500 10/26/21 0327 10/28/21 0309 10/29/21 0235  NA 145 142 141 141 141  K 3.5 3.6 4.3 4.0 4.1  CL 110 106 109 104 104  CO2 30 29 25  32 31  GLUCOSE 119* 139* 134* 150* 122*  BUN 17 16 21 18 16   CREATININE 0.88 0.87 0.86 0.64 0.61  CALCIUM 8.4* 8.6* 8.1* 8.3* 8.2*   GFR: Estimated Creatinine Clearance: 77.7 mL/min (by C-G formula based on SCr of 0.61 mg/dL). Liver Function Tests: Recent Labs  Lab 10/24/21 0116 10/25/21 0500 10/26/21 0327 10/28/21 0309  AST 30 28 27 22   ALT 25 25 20 19   ALKPHOS 71 73 57 54  BILITOT 0.8 0.5 0.5 0.3  PROT 5.2* 5.6* 4.6* 4.8*  ALBUMIN 2.0* 2.0* 1.7* 1.7*   No results for input(s): LIPASE, AMYLASE in the last 168 hours. No results for input(s): AMMONIA in the last 168 hours. Coagulation Profile: No results for input(s): INR, PROTIME in the last 168 hours. Cardiac Enzymes: No results for input(s): CKTOTAL, CKMB, CKMBINDEX, TROPONINI in the last 168 hours. BNP (last 3 results) No results for input(s): PROBNP in the last 8760 hours. HbA1C: No results for input(s): HGBA1C in the last 72 hours. CBG: Recent Labs  Lab 10/29/21 1930 10/29/21 2337 10/30/21 0349 10/30/21 0815 10/30/21 1125  GLUCAP 134* 104* 114* 93 101*   Lipid Profile: No results for input(s): CHOL, HDL, LDLCALC, TRIG, CHOLHDL, LDLDIRECT in the last 72 hours. Thyroid Function Tests: No results for input(s): TSH, T4TOTAL, FREET4, T3FREE, THYROIDAB in the last 72 hours. Anemia Panel: No results for input(s): VITAMINB12, FOLATE, FERRITIN, TIBC, IRON, RETICCTPCT in the last 72  hours.    Radiology Studies: I have reviewed all of the imaging during this hospital visit personally     Scheduled Meds:  amoxicillin-clavulanate  10 mL Per Tube Q12H   brinzolamide  1 drop Both Eyes TID   chlorhexidine  15 mL Mouth Rinse BID   Chlorhexidine Gluconate Cloth  6 each Topical Q0600   donepezil  10 mg Per Tube QHS   enoxaparin (LOVENOX) injection  40 mg Subcutaneous Q24H   feeding supplement (PROSource TF)  45 mL Per Tube Daily   free water  150 mL Per Tube Q6H   latanoprost  1 drop Both Eyes QHS   levETIRAcetam  500 mg Oral BID   mouth rinse  15 mL Mouth Rinse q12n4p   memantine  10 mg Per Tube BID   risperiDONE  0.5 mg Per Tube QHS   valproic acid  500 mg Per Tube BID   Continuous Infusions:  sodium chloride Stopped (10/27/21 0120)   feeding supplement (JEVITY 1.2 CAL) 1,000 mL (10/30/21 0352)  LOS: 10 days        Durga Saldarriaga Annett Gula, MD

## 2021-10-31 DIAGNOSIS — E785 Hyperlipidemia, unspecified: Secondary | ICD-10-CM | POA: Diagnosis not present

## 2021-10-31 DIAGNOSIS — F01518 Vascular dementia, unspecified severity, with other behavioral disturbance: Secondary | ICD-10-CM | POA: Diagnosis not present

## 2021-10-31 DIAGNOSIS — J189 Pneumonia, unspecified organism: Secondary | ICD-10-CM | POA: Diagnosis not present

## 2021-10-31 DIAGNOSIS — U071 COVID-19: Secondary | ICD-10-CM | POA: Diagnosis not present

## 2021-10-31 LAB — GLUCOSE, CAPILLARY
Glucose-Capillary: 103 mg/dL — ABNORMAL HIGH (ref 70–99)
Glucose-Capillary: 113 mg/dL — ABNORMAL HIGH (ref 70–99)
Glucose-Capillary: 117 mg/dL — ABNORMAL HIGH (ref 70–99)
Glucose-Capillary: 118 mg/dL — ABNORMAL HIGH (ref 70–99)
Glucose-Capillary: 83 mg/dL (ref 70–99)
Glucose-Capillary: 84 mg/dL (ref 70–99)
Glucose-Capillary: 98 mg/dL (ref 70–99)

## 2021-10-31 NOTE — Plan of Care (Signed)
  Problem: Health Behavior/Discharge Planning: Goal: Ability to manage health-related needs will improve Outcome: Progressing   Problem: Clinical Measurements: Goal: Ability to maintain clinical measurements within normal limits will improve Outcome: Progressing Goal: Will remain free from infection Outcome: Progressing Goal: Diagnostic test results will improve Outcome: Progressing Goal: Respiratory complications will improve Outcome: Progressing Goal: Cardiovascular complication will be avoided Outcome: Progressing   Problem: Activity: Goal: Risk for activity intolerance will decrease Outcome: Progressing   Problem: Nutrition: Goal: Adequate nutrition will be maintained Outcome: Progressing   Problem: Elimination: Goal: Will not experience complications related to bowel motility Outcome: Progressing Goal: Will not experience complications related to urinary retention Outcome: Progressing   Problem: Pain Managment: Goal: General experience of comfort will improve Outcome: Progressing   Problem: Safety: Goal: Ability to remain free from injury will improve Outcome: Progressing   Problem: Skin Integrity: Goal: Risk for impaired skin integrity will decrease Outcome: Progressing   Problem: Activity: Goal: Ability to tolerate increased activity will improve Outcome: Progressing   Problem: Clinical Measurements: Goal: Ability to maintain a body temperature in the normal range will improve Outcome: Progressing   Problem: Respiratory: Goal: Ability to maintain adequate ventilation will improve Outcome: Progressing Goal: Ability to maintain a clear airway will improve Outcome: Progressing   Problem: Education: Goal: Knowledge of risk factors and measures for prevention of condition will improve Outcome: Progressing   Problem: Coping: Goal: Psychosocial and spiritual needs will be supported Outcome: Progressing   Problem: Respiratory: Goal: Will maintain a  patent airway Outcome: Progressing Goal: Complications related to the disease process, condition or treatment will be avoided or minimized Outcome: Progressing

## 2021-10-31 NOTE — Progress Notes (Signed)
PROGRESS NOTE    [name] [name]  NWG:[id number] DOB: 06/19/1935 DOA: 10/20/2021 PCP: Karna Dupes, MD    Brief Narrative:  [name] [name] was admitted to the hospital with the working diagnosis of bilateral pneumonia in the setting of COVID viral pneumonia.  Prolonged hospitalization.    86 year old male past medical history for dementia, seizures, BPH and gastrointestinal bleed who presented with respiratory distress.  At home patient having significant cough, concern about aspiration.  Recent SARS COVID-19 infection May 2022.  On his initial physical examination blood pressure 135/78, heart rate 97, respiratory 25, oxygen saturation 98%, his lungs had expiratory wheezing, no rales, heart S1-S2, present, rhythmic, soft abdomen, no lower extremity edema.  Patient responded to his name but not following commands.   Sodium 141, potassium 3.9, chloride 107, bicarb 27, glucose 125, BUN 27, creatinine 1.0, white count 4.7, hemoglobin 11.1, hematocrit 35.7, platelets 217. SARS COVID-19 positive   Urinalysis specific gravity 1.033, negative leukocytes.   Chest radiograph with bilateral lower lobe infiltrates.   EKG 99 bpm, left axis deviation, left anterior fascicular block, right bundle branch block, sinus rhythm, no significant ST segment or T wave changes.   Patient received antibiotic therapy and supplemental oxygen per nasal cannula. He did received COVID-specific therapy. Severe deconditioning and swallow dysfunction. Nasogastric tube was placed for feedings.   Prolonged hospitalization, slowly improving, now on dysphagia 2 diet.   Assessment & Plan:   Principal Problem:   Pneumonia Active Problems:   Dyslipidemia   Essential hypertension   Dementia (HCC)   Seizure (HCC)   COVID     Bilateral lower lobe pneumonia, bacterial over infection in the setting of SARS COVID 19 viral infection.  Now off isolation.  Continue oxygenation well, no further need of supplemental  02. No apparent dyspnea.     Continue with supportive medical therapy with: guaifenesin  Aspiration precautions and dysphagia 2 diet. Continue with pt and ot as tolerated.    2. Dementia/ swallow dysfunction.   Continue with memantine and risperidone.  No agitation, patient is awake and alert, following commands.   Improved po intake, plan to remove NG tube and continue oral nutrition. He is on dysphagia 2 diet.    3. HTN dyslipidemia.  Blood pressure has been stable. Not on statin, plan to resume as outpatient.    4. Seizures. No signs of active seizures, On valproic acid and keppra   5. Chronic anemia.  Cell count has been stable, follow cell count as needed   6. Stage 1 bilateral heels pressure injury stage 1. Not clear if present on admission.  Continue local skin care, he has protective boots in place.   As needed tramadol for pain.   Status is: Inpatient  Remains inpatient appropriate because: Pending transfer to SNF.   DVT prophylaxis: Enoxaparin   Code Status:    full  Family Communication:  I spoke over the phone with the patient's son about patient's  condition, plan of care, prognosis and all questions were addressed.    Skin Documentation: Pressure Injury 05/08/21 Coccyx Mid Stage 1 -  Intact skin with non-blanchable redness of a localized area usually over a bony prominence. (Active)  05/08/21 0515  Location: Coccyx  Location Orientation: Mid  Staging: Stage 1 -  Intact skin with non-blanchable redness of a localized area usually over a bony prominence.  Wound Description (Comments):   Present on Admission: Yes     Pressure Injury 10/25/21 Heel Left Deep Tissue Pressure Injury -  Purple or maroon localized area of discolored intact skin or blood-filled blister due to damage of underlying soft tissue from pressure and/or shear. purple wound on heel (Active)  10/25/21 0800  Location: Heel  Location Orientation: Left  Staging: Deep Tissue Pressure Injury -  Purple or maroon localized area of discolored intact skin or blood-filled blister due to damage of underlying soft tissue from pressure and/or shear.  Wound Description (Comments): purple wound on heel  Present on Admission:      Pressure Injury 10/25/21 Heel Right Deep Tissue Pressure Injury - Purple or maroon localized area of discolored intact skin or blood-filled blister due to damage of underlying soft tissue from pressure and/or shear. purple wound on heel (Active)  10/25/21 0800  Location: Heel  Location Orientation: Right  Staging: Deep Tissue Pressure Injury - Purple or maroon localized area of discolored intact skin or blood-filled blister due to damage of underlying soft tissue from pressure and/or shear.  Wound Description (Comments): purple wound on heel  Present on Admission:       Subjective: Patient is awake and alert, he is confused but abel to respond to questions and follow commands, tolerating po well, no nausea or vomiting.   Objective: Vitals:   10/30/21 0900 10/30/21 1500 10/30/21 1954 10/31/21 0415  BP: 130/65 128/70 109/62 (!) 140/92  Pulse: 75 82 71 75  Resp: 18 20 16 18   Temp: 98.1 F (36.7 C) 98.2 F (36.8 C) 97.7 F (36.5 C) 97.7 F (36.5 C)  TempSrc:   Oral Axillary  SpO2: 97% 96% 97% 100%  Weight:    87.4 kg  Height:        Intake/Output Summary (Last 24 hours) at 10/31/2021 1015 Last data filed at 10/31/2021 0901 Gross per 24 hour  Intake 900.33 ml  Output 1604 ml  Net -703.67 ml   Filed Weights   10/29/21 0500 10/30/21 0400 10/31/21 0415  Weight: 84.9 kg 85.9 kg 87.4 kg    Examination:   General: Not in pain or dyspnea. Deconditioned  Neurology: Awake and alert, non focal. Confused and disorientated but not agitated.  E ENT: no pallor, no icterus, oral mucosa moist Cardiovascular: No JVD. S1-S2 present, rhythmic, no gallops, rubs, or murmurs. No lower extremity edema. Pulmonary: postiive breath sounds bilaterally, adequate air  movement, no wheezing, rhonchi or rales. Gastrointestinal. Abdomen soft and non tender Skin. No rashes Musculoskeletal: no joint deformities     Data Reviewed: I have personally reviewed following labs and imaging studies  CBC: Recent Labs  Lab 10/26/21 0327 10/28/21 0309  WBC 7.7 6.3  NEUTROABS 4.9  --   HGB 10.8* 10.6*  HCT 34.9* 34.2*  MCV 88.4 88.4  PLT 224 123456   Basic Metabolic Panel: Recent Labs  Lab 10/25/21 0500 10/26/21 0327 10/28/21 0309 10/29/21 0235 10/30/21 0636  NA 142 141 141 141 139  K 3.6 4.3 4.0 4.1 5.1  CL 106 109 104 104 100  CO2 29 25 32 31 32  GLUCOSE 139* 134* 150* 122* 121*  BUN 16 21 18 16 19   CREATININE 0.87 0.86 0.64 0.61 0.71  CALCIUM 8.6* 8.1* 8.3* 8.2* 8.2*   GFR: Estimated Creatinine Clearance: 77.7 mL/min (by C-G formula based on SCr of 0.71 mg/dL). Liver Function Tests: Recent Labs  Lab 10/25/21 0500 10/26/21 0327 10/28/21 0309  AST 28 27 22   ALT 25 20 19   ALKPHOS 73 57 54  BILITOT 0.5 0.5 0.3  PROT 5.6* 4.6* 4.8*  ALBUMIN 2.0* 1.7* 1.7*  No results for input(s): LIPASE, AMYLASE in the last 168 hours. No results for input(s): AMMONIA in the last 168 hours. Coagulation Profile: No results for input(s): INR, PROTIME in the last 168 hours. Cardiac Enzymes: No results for input(s): CKTOTAL, CKMB, CKMBINDEX, TROPONINI in the last 168 hours. BNP (last 3 results) No results for input(s): PROBNP in the last 8760 hours. HbA1C: No results for input(s): HGBA1C in the last 72 hours. CBG: Recent Labs  Lab 10/30/21 1641 10/30/21 1951 10/31/21 0003 10/31/21 0423 10/31/21 0821  GLUCAP 95 122* 113* 118* 117*   Lipid Profile: No results for input(s): CHOL, HDL, LDLCALC, TRIG, CHOLHDL, LDLDIRECT in the last 72 hours. Thyroid Function Tests: No results for input(s): TSH, T4TOTAL, FREET4, T3FREE, THYROIDAB in the last 72 hours. Anemia Panel: No results for input(s): VITAMINB12, FOLATE, FERRITIN, TIBC, IRON, RETICCTPCT in the  last 72 hours.    Radiology Studies: I have reviewed all of the imaging during this hospital visit personally     Scheduled Meds:  brinzolamide  1 drop Both Eyes TID   chlorhexidine  15 mL Mouth Rinse BID   donepezil  10 mg Per Tube QHS   enoxaparin (LOVENOX) injection  40 mg Subcutaneous Q24H   feeding supplement (PROSource TF)  45 mL Per Tube Daily   free water  150 mL Per Tube Q6H   latanoprost  1 drop Both Eyes QHS   levETIRAcetam  500 mg Oral BID   mouth rinse  15 mL Mouth Rinse q12n4p   memantine  10 mg Per Tube BID   risperiDONE  0.5 mg Per Tube QHS   valproic acid  500 mg Per Tube BID   Continuous Infusions:  sodium chloride Stopped (10/27/21 0120)   feeding supplement (JEVITY 1.2 CAL) 1,000 mL (10/31/21 0647)     LOS: 11 days        Linnie Mcglocklin Gerome Apley, MD

## 2021-11-01 DIAGNOSIS — U071 COVID-19: Secondary | ICD-10-CM | POA: Diagnosis not present

## 2021-11-01 DIAGNOSIS — F01518 Vascular dementia, unspecified severity, with other behavioral disturbance: Secondary | ICD-10-CM | POA: Diagnosis not present

## 2021-11-01 DIAGNOSIS — E785 Hyperlipidemia, unspecified: Secondary | ICD-10-CM | POA: Diagnosis not present

## 2021-11-01 DIAGNOSIS — J189 Pneumonia, unspecified organism: Secondary | ICD-10-CM | POA: Diagnosis not present

## 2021-11-01 LAB — GLUCOSE, CAPILLARY
Glucose-Capillary: 82 mg/dL (ref 70–99)
Glucose-Capillary: 65 mg/dL — ABNORMAL LOW (ref 70–99)
Glucose-Capillary: 92 mg/dL (ref 70–99)
Glucose-Capillary: 67 mg/dL — ABNORMAL LOW (ref 70–99)
Glucose-Capillary: 82 mg/dL (ref 70–99)
Glucose-Capillary: 113 mg/dL — ABNORMAL HIGH (ref 70–99)
Glucose-Capillary: 75 mg/dL (ref 70–99)

## 2021-11-01 MED ORDER — VALPROIC ACID 250 MG/5ML PO SOLN
500.0000 mg | Freq: Two times a day (BID) | ORAL | Status: DC
  Administered 2021-11-01 – 2021-11-05 (×8): 500 mg via ORAL
  Filled 2021-11-01 (×9): qty 10

## 2021-11-01 MED ORDER — RISPERIDONE 0.5 MG PO TABS
0.5000 mg | ORAL_TABLET | Freq: Every day | ORAL | Status: DC
  Administered 2021-11-01 – 2021-11-04 (×4): 0.5 mg via ORAL
  Filled 2021-11-01 (×5): qty 1

## 2021-11-01 MED ORDER — MEMANTINE HCL 10 MG PO TABS
10.0000 mg | ORAL_TABLET | Freq: Two times a day (BID) | ORAL | Status: DC
  Administered 2021-11-01 – 2021-11-05 (×8): 10 mg via ORAL
  Filled 2021-11-01 (×8): qty 1

## 2021-11-01 MED ORDER — DONEPEZIL HCL 10 MG PO TABS
10.0000 mg | ORAL_TABLET | Freq: Every day | ORAL | Status: DC
  Administered 2021-11-01 – 2021-11-04 (×4): 10 mg via ORAL
  Filled 2021-11-01 (×4): qty 1

## 2021-11-01 NOTE — TOC Progression Note (Signed)
Transition of Care [facility]) - Initial/Assessment Note    Patient Details  Name: [name] [name] MRN: [id number] Date of Birth: 12-29-1934  [facility] CM/SW Contact:    Ralene Bathe, LCSWA Phone Number: 11/01/2021, 1:51 PM  Clinical Narrative:                 CSW spoke the patient's wife regarding discharge.  CSW was informed that the family just applied for Medicaid and would like to return to [place].  CSW called Malena Peer with admissions at [facility] to inform the facility that the patient is ready to d/c.  CSW was informed that the facility has a meeting with the wife at 1500 and will have an answer about the patient's return then.    Expected Discharge Plan: Skilled Nursing Facility Barriers to Discharge: Continued Medical Work up   Patient Goals and CMS Choice Patient states their goals for this hospitalization and ongoing recovery are:: unable to discuss covid precautions CMS Medicare.gov Compare Post Acute Care list provided to::  (per wife will return to [place]) Choice offered to / list presented to : NA  Expected Discharge Plan and Services Expected Discharge Plan: Skilled Nursing Facility In-house Referral: NA Discharge Planning Services: CM Consult Post Acute Care Choice: NA Living arrangements for the past 2 months: Skilled Nursing Facility                 DME Arranged: N/A DME Agency: NA       HH Arranged: NA HH Agency: NA        Prior Living Arrangements/Services Living arrangements for the past 2 months: Skilled Nursing Facility Lives with:: Facility Resident Patient language and need for interpreter reviewed:: Yes        Need for Family Participation in Patient Care: Yes (Comment) Care giver support system in place?: Yes (comment) Current home services:  (n/a) Criminal Activity/Legal Involvement Pertinent to Current Situation/Hospitalization: No - Comment as needed  Activities of Daily Living      Permission Sought/Granted    Permission granted to share information with : No              Emotional Assessment Appearance::  (unable to assess)   Affect (typically observed): Unable to Assess Orientation: :  (unable to assess)   Psych Involvement: No (comment)  Admission diagnosis:  Pneumonia [J18.9] Aspiration pneumonia of both lower lobes, unspecified aspiration pneumonia type (HCC) [J69.0] COVID [U07.1] Dementia, unspecified dementia severity, unspecified dementia type, unspecified whether behavioral, psychotic, or mood disturbance or anxiety (HCC) [F03.90] Patient Active Problem List   Diagnosis Date Noted   Pneumonia 10/20/2021   Pressure injury of skin 05/11/2021   Pericardial effusion, acute    COVID    Palliative care by specialist    Goals of care, counseling/discussion    General weakness    Syncope 05/07/2021   Seizure (HCC) 03/09/2021   Dementia (HCC) 02/25/2021   BPH with urinary obstruction 12/22/2020   Ankle edema, bilateral 07/13/2020   Hallucinations 02/09/2020   Abdominal pain, acute 08/21/2014   GI bleeding 05/26/2012   Renal mass 05/26/2012   Diverticulosis of colon with hemorrhage 05/26/2012   SHOULDER PAIN, BILATERAL 07/30/2010   HERPES SIMPLEX INFECTION 10/10/2007   TACHYCARDIA, PAROXYSMAL NOS 10/10/2007   Dyslipidemia 07/11/2007   Essential hypertension 07/11/2007   ALLERGIC RHINITIS 07/11/2007   PCP:  Karna Dupes, MD Pharmacy:   [id number] [place],  - [address]  [place] [address] Phone: [phone] Fax: [phone]     Social Determinants of Health (SDOH) Interventions    Readmission Risk Interventions Readmission Risk Prevention Plan 10/22/2021  Transportation Screening Complete  Medication Review [facility]) Referral to Pharmacy  PCP or Specialist appointment within 3-5 days of discharge Complete  HRI or Home Care Consult Not Complete  HRI or Home Care Consult Pt Refusal  Comments from SNF  SW Recovery Care/Counseling Consult Complete  Palliative Care Screening Not Applicable  Skilled Nursing Facility Complete  Some recent data might be hidden

## 2021-11-01 NOTE — Plan of Care (Signed)
  Problem: Health Behavior/Discharge Planning: Goal: Ability to manage health-related needs will improve Outcome: Progressing   Problem: Clinical Measurements: Goal: Ability to maintain clinical measurements within normal limits will improve Outcome: Progressing Goal: Will remain free from infection Outcome: Progressing Goal: Diagnostic test results will improve Outcome: Progressing Goal: Respiratory complications will improve Outcome: Progressing Goal: Cardiovascular complication will be avoided Outcome: Progressing   Problem: Activity: Goal: Risk for activity intolerance will decrease Outcome: Progressing   Problem: Nutrition: Goal: Adequate nutrition will be maintained Outcome: Progressing   Problem: Elimination: Goal: Will not experience complications related to bowel motility Outcome: Progressing Goal: Will not experience complications related to urinary retention Outcome: Progressing   Problem: Pain Managment: Goal: General experience of comfort will improve Outcome: Progressing   Problem: Safety: Goal: Ability to remain free from injury will improve Outcome: Progressing   Problem: Skin Integrity: Goal: Risk for impaired skin integrity will decrease Outcome: Progressing   Problem: Activity: Goal: Ability to tolerate increased activity will improve Outcome: Progressing   Problem: Clinical Measurements: Goal: Ability to maintain a body temperature in the normal range will improve Outcome: Progressing   Problem: Respiratory: Goal: Ability to maintain adequate ventilation will improve Outcome: Progressing Goal: Ability to maintain a clear airway will improve Outcome: Progressing   Problem: Education: Goal: Knowledge of risk factors and measures for prevention of condition will improve Outcome: Progressing   Problem: Coping: Goal: Psychosocial and spiritual needs will be supported Outcome: Progressing   Problem: Respiratory: Goal: Will maintain a  patent airway Outcome: Progressing Goal: Complications related to the disease process, condition or treatment will be avoided or minimized Outcome: Progressing

## 2021-11-01 NOTE — Care Management Important Message (Signed)
Important Message  Patient Details  Name: DAETON KLUTH MRN: [id number] Date of Birth: 10-22-1935   Medicare Important Message Given:  Yes     Dorena Bodo 11/01/2021, 3:02 PM

## 2021-11-01 NOTE — Progress Notes (Signed)
Speech Language Pathology Treatment: Dysphagia  Patient Details Name: [name] [name] MRN: [id number] DOB: 07-25-1935 Today's Date: 11/01/2021 Time: 4696-2952 SLP Time Calculation (min) (ACUTE ONLY): 22 min  Assessment / Plan / Recommendation Clinical Impression  Pt alert, not f/c, calling out for people who were not present.  Upon entering room, voice was wet - he sounded as if he had standing secretions.  He fed himself sips of honey thick liquid from a cup with tactile/verbal cues needed to slow down.  There was no overt difficulty; however, trials of thins and nectar-thicks led to coughing and wet phonation.  Oral suctioning provided - pt was resistant and continued to cough with ongoing concerns for aspiration of thin/nectars and/or standing secretions.  Overall function today appears to be consistent with results of 11/4 MBS. His risk for aspiration given dementia and deconditioning remains high, despite efforts to modify diet and help him to reduce pace/quantity of intake. Breath sounds with bil rhonchi per chart review.  SLP will continue to follow while in acute care - continue dysphagia 2/honey thick for now.      HPI HPI: Pt is an 86 y/o male admitted 10/26 from nursing home with respiratory distress/concern for aspiration. CXR 10/26: Bilateral lower lung airspace disease, left greater than right, concerning for infection. MBS 11/4: dys2/honey, mod dysphagia. PMH: dementia, seizures, BPH, GI bleed, HTN, HLD, hyperglycemia.      SLP Plan  Continue with current plan of care      Recommendations for follow up therapy are one component of a multi-disciplinary discharge planning process, led by the attending physician.  Recommendations may be updated based on patient status, additional functional criteria and insurance authorization.    Recommendations  Diet recommendations: Dysphagia 2 (fine chop);Honey-thick liquid Liquids provided via: Cup;No straw Medication Administration:  Crushed with puree Supervision: Full supervision/cueing for compensatory strategies Compensations: Minimize environmental distractions;Slow rate;Small sips/bites;Effortful swallow;Follow solids with liquid Postural Changes and/or Swallow Maneuvers: Seated upright 90 degrees                Oral Care Recommendations: Oral care BID Follow up Recommendations: Skilled Nursing facility SLP Visit Diagnosis: Dysphagia, oropharyngeal phase (R13.12) Plan: Continue with current plan of care       [name] [name] L. [name] Frederic, MA CCC/SLP Acute Rehabilitation Services Office number [phone] Pager [phone]                 [name] [name] [name] [name]  11/01/2021, 10:29 AM

## 2021-11-01 NOTE — Progress Notes (Signed)
PT Cancellation Note  Patient Details Name: MARWAN LIPE MRN: [id number] DOB: 05/28/1935   Cancelled Treatment:    Reason Eval/Treat Not Completed: Patient declined, no reason specified. Patient reports he is not feeling well this am. Will re-attempt later as time allows.    Justin Buechner 11/01/2021, 9:39 AM

## 2021-11-01 NOTE — Progress Notes (Signed)
PROGRESS NOTE    [name] [name]  IOE:[id number] DOB: 1935/01/21 DOA: 10/20/2021 PCP: Karna Dupes, MD    Brief Narrative:  [name] [name] was admitted to the hospital with the working diagnosis of bilateral pneumonia in the setting of COVID viral pneumonia.  Prolonged hospitalization.    86 year old male past medical history for dementia, seizures, BPH and gastrointestinal bleed who presented with respiratory distress.  At home patient having significant cough, concern about aspiration.  Recent SARS COVID-19 infection May 2022.  On his initial physical examination blood pressure 135/78, heart rate 97, respiratory 25, oxygen saturation 98%, his lungs had expiratory wheezing, no rales, heart S1-S2, present, rhythmic, soft abdomen, no lower extremity edema.  Patient responded to his name but not following commands.   Sodium 141, potassium 3.9, chloride 107, bicarb 27, glucose 125, BUN 27, creatinine 1.0, white count 4.7, hemoglobin 11.1, hematocrit 35.7, platelets 217. SARS COVID-19 positive   Urinalysis specific gravity 1.033, negative leukocytes.   Chest radiograph with bilateral lower lobe infiltrates.   EKG 99 bpm, left axis deviation, left anterior fascicular block, right bundle branch block, sinus rhythm, no significant ST segment or T wave changes.   Patient received antibiotic therapy and supplemental oxygen per nasal cannula. He did received COVID-specific therapy. Severe deconditioning and swallow dysfunction. Nasogastric tube was placed for feedings.   Prolonged hospitalization, slowly improving, now on dysphagia 2 diet. He has completed antibiotic therapy.  His son would like to change to different SNF.   Assessment & Plan:   Principal Problem:   Pneumonia Active Problems:   Dyslipidemia   Essential hypertension   Dementia (HCC)   Seizure (HCC)   COVID   Bilateral lower lobe pneumonia, bacterial over infection in the setting of SARS COVID 19 viral  infection.  Now off isolation.  Dyspnea has resolved.  Patient is medically stable for discharge, he has completed medical therapy for pneumonia.     2. Dementia/ swallow dysfunction.   On memantine and risperidone.  Currently with no agitation.   On dysphagia 2 diet with good toleration.     3. HTN dyslipidemia.  Continue blood pressure monitoring, considering advance dementia and poor prognosis will continue to hold statin for now.    4. Seizures. No signs of active seizures, Continue with valproic acid and keppra   5. Chronic anemia.  Anemia of chronic disease follow up cell count as outpatient    6. Stage 1 bilateral heels pressure injury stage 1. Not clear if present on admission.  Continue local skin care, he has protective boots in place.   As needed tramadol for pain.     Status is: Inpatient  Remains inpatient appropriate because: Plan to transfer patient back to SNF tomorrow.   DVT prophylaxis: Enoxaparin   Code Status:    full  Family Communication:  I spoke with patient's wife at the bedside, we talked in detail about patient's condition, plan of care and prognosis and all questions were addressed.      Subjective: Patient with no nausea or vomiting, he has no evidence of dyspnea and is tolerating po well.  Objective: Vitals:   10/31/21 0415 10/31/21 1259 10/31/21 2100 11/01/21 0453  BP: (!) 140/92 103/83 137/79 (!) 143/79  Pulse: 75 83 72 68  Resp: 18 17 18 18   Temp: 97.7 F (36.5 C) 97.7 F (36.5 C) 98.6 F (37 C) 97.8 F (36.6 C)  TempSrc: Axillary  Axillary Axillary  SpO2: 100% 100% 100% 100%  Weight: 87.4  kg     Height:        Intake/Output Summary (Last 24 hours) at 11/01/2021 1310 Last data filed at 11/01/2021 0300 Gross per 24 hour  Intake 240 ml  Output 2000 ml  Net -1760 ml   Filed Weights   10/29/21 0500 10/30/21 0400 10/31/21 0415  Weight: 84.9 kg 85.9 kg 87.4 kg    Examination:   General: Not in pain or dyspnea Neurology:  Awake and alert, non focal, confused and disorientated but not agitated.   E ENT: no pallor, no icterus, oral mucosa moist Cardiovascular: No JVD. S1-S2 present, rhythmic, no gallops, rubs, or murmurs. No lower extremity edema. Pulmonary: positive breath sounds bilaterally, adequate air movement, no wheezing, rhonchi or rales. Gastrointestinal. Abdomen soft and non tender Skin. No rashes Musculoskeletal: no joint deformities     Data Reviewed: I have personally reviewed following labs and imaging studies  CBC: Recent Labs  Lab 10/26/21 0327 10/28/21 0309  WBC 7.7 6.3  NEUTROABS 4.9  --   HGB 10.8* 10.6*  HCT 34.9* 34.2*  MCV 88.4 88.4  PLT 224 [id number]   Basic Metabolic Panel: Recent Labs  Lab 10/26/21 0327 10/28/21 0309 10/29/21 0235 10/30/21 0636  NA 141 141 141 139  K 4.3 4.0 4.1 5.1  CL 109 104 104 100  CO2 25 32 31 32  GLUCOSE 134* 150* 122* 121*  BUN 21 18 16 19   CREATININE 0.86 0.64 0.61 0.71  CALCIUM 8.1* 8.3* 8.2* 8.2*   GFR: Estimated Creatinine Clearance: 77.7 mL/min (by C-G formula based on SCr of 0.71 mg/dL). Liver Function Tests: Recent Labs  Lab 10/26/21 0327 10/28/21 0309  AST 27 22  ALT 20 19  ALKPHOS 57 54  BILITOT 0.5 0.3  PROT 4.6* 4.8*  ALBUMIN 1.7* 1.7*   No results for input(s): LIPASE, AMYLASE in the last 168 hours. No results for input(s): AMMONIA in the last 168 hours. Coagulation Profile: No results for input(s): INR, PROTIME in the last 168 hours. Cardiac Enzymes: No results for input(s): CKTOTAL, CKMB, CKMBINDEX, TROPONINI in the last 168 hours. BNP (last 3 results) No results for input(s): PROBNP in the last 8760 hours. HbA1C: No results for input(s): HGBA1C in the last 72 hours. CBG: Recent Labs  Lab 11/01/21 0315 11/01/21 0733 11/01/21 0826 11/01/21 1135 11/01/21 1138  GLUCAP 82 65* 92 67* 82   Lipid Profile: No results for input(s): CHOL, HDL, LDLCALC, TRIG, CHOLHDL, LDLDIRECT in the last 72 hours. Thyroid  Function Tests: No results for input(s): TSH, T4TOTAL, FREET4, T3FREE, THYROIDAB in the last 72 hours. Anemia Panel: No results for input(s): VITAMINB12, FOLATE, FERRITIN, TIBC, IRON, RETICCTPCT in the last 72 hours.    Radiology Studies: I have reviewed all of the imaging during this hospital visit personally     Scheduled Meds:  brinzolamide  1 drop Both Eyes TID   chlorhexidine  15 mL Mouth Rinse BID   donepezil  10 mg Per Tube QHS   enoxaparin (LOVENOX) injection  40 mg Subcutaneous Q24H   latanoprost  1 drop Both Eyes QHS   levETIRAcetam  500 mg Oral BID   mouth rinse  15 mL Mouth Rinse q12n4p   memantine  10 mg Per Tube BID   risperiDONE  0.5 mg Per Tube QHS   valproic acid  500 mg Per Tube BID   Continuous Infusions:  sodium chloride Stopped (10/27/21 0120)     LOS: 12 days        Jimmy Picket  Chi Woodham, MD

## 2021-11-02 DIAGNOSIS — E785 Hyperlipidemia, unspecified: Secondary | ICD-10-CM | POA: Diagnosis not present

## 2021-11-02 DIAGNOSIS — U071 COVID-19: Secondary | ICD-10-CM | POA: Diagnosis not present

## 2021-11-02 DIAGNOSIS — F01518 Vascular dementia, unspecified severity, with other behavioral disturbance: Secondary | ICD-10-CM | POA: Diagnosis not present

## 2021-11-02 DIAGNOSIS — J189 Pneumonia, unspecified organism: Secondary | ICD-10-CM | POA: Diagnosis not present

## 2021-11-02 LAB — GLUCOSE, CAPILLARY
Glucose-Capillary: 100 mg/dL — ABNORMAL HIGH (ref 70–99)
Glucose-Capillary: 79 mg/dL (ref 70–99)
Glucose-Capillary: 82 mg/dL (ref 70–99)
Glucose-Capillary: 88 mg/dL (ref 70–99)
Glucose-Capillary: 89 mg/dL (ref 70–99)
Glucose-Capillary: 93 mg/dL (ref 70–99)
Glucose-Capillary: 95 mg/dL (ref 70–99)

## 2021-11-02 NOTE — Plan of Care (Signed)
  Problem: Health Behavior/Discharge Planning: Goal: Ability to manage health-related needs will improve Outcome: Not Progressing   Problem: Clinical Measurements: Goal: Ability to maintain clinical measurements within normal limits will improve Outcome: Not Progressing Goal: Will remain free from infection Outcome: Not Progressing Goal: Diagnostic test results will improve Outcome: Not Progressing Goal: Respiratory complications will improve Outcome: Not Progressing Goal: Cardiovascular complication will be avoided Outcome: Not Progressing   Problem: Activity: Goal: Risk for activity intolerance will decrease Outcome: Not Progressing   Problem: Nutrition: Goal: Adequate nutrition will be maintained Outcome: Not Progressing   Problem: Elimination: Goal: Will not experience complications related to bowel motility Outcome: Not Progressing Goal: Will not experience complications related to urinary retention Outcome: Not Progressing   Problem: Pain Managment: Goal: General experience of comfort will improve Outcome: Not Progressing   Problem: Safety: Goal: Ability to remain free from injury will improve Outcome: Not Progressing   Problem: Skin Integrity: Goal: Risk for impaired skin integrity will decrease Outcome: Not Progressing   Problem: Activity: Goal: Ability to tolerate increased activity will improve Outcome: Not Progressing   Problem: Clinical Measurements: Goal: Ability to maintain a body temperature in the normal range will improve Outcome: Not Progressing   Problem: Respiratory: Goal: Ability to maintain adequate ventilation will improve Outcome: Not Progressing Goal: Ability to maintain a clear airway will improve Outcome: Not Progressing   Problem: Education: Goal: Knowledge of risk factors and measures for prevention of condition will improve Outcome: Not Progressing   Problem: Coping: Goal: Psychosocial and spiritual needs will be  supported Outcome: Not Progressing   Problem: Respiratory: Goal: Will maintain a patent airway Outcome: Not Progressing Goal: Complications related to the disease process, condition or treatment will be avoided or minimized Outcome: Not Progressing

## 2021-11-02 NOTE — Progress Notes (Signed)
Physical Therapy Treatment Patient Details Name: [name] [name] MRN: [id number] DOB: [birth year] Today's Date: 11/02/2021   History of Present Illness 86 y/o male admitted 10/26 in respiratory distress with rhonchi and coughing, concern for aspiration.  Pt was initially requiring 2L O2 to maintain sats.  Covid check on admission came back positive.  PMHx:  dementia, glaucoma, HTN,    PT Comments    Pt was seen with OT due to his struggle with tolerating mobility, and to use two person skilled help for placement of lift and work to recover standing with therapy.  Pt is in some discomfort to sit due to his L knee stretching after being so stiff, and will recommend him to continue to work on ROM and tolerance OOB with skin protection for heels.  Pt is able to be assisted to stand and shows promise for rehab progression upon dc to SNF.  Follow up acutely as tolerated for goals of PT.   Recommendations for follow up therapy are one component of a multi-disciplinary discharge planning process, led by the attending physician.  Recommendations may be updated based on patient status, additional functional criteria and insurance authorization.  Follow Up Recommendations  PT at [facility]-term acute care hospital     Assistance Recommended at Discharge Frequent or constant Supervision/Assistance  Equipment Recommendations  None recommended by PT    Recommendations for Other Services       Precautions / Restrictions Precautions Precautions: Fall Precaution Comments: catheter Restrictions Weight Bearing Restrictions: Yes LLE Weight Bearing: Weight bearing as tolerated Other Position/Activity Restrictions: monitor heel wounds for protection of skin     Mobility  Bed Mobility Overal bed mobility: Needs Assistance Bed Mobility: Supine to Sit     Supine to sit: Mod assist;+2 for physical assistance;+2 for safety/equipment          Transfers Overall transfer level: Needs assistance Equipment  used: Rolling walker (2 wheels) Transfers: Sit to/from Stand Sit to Stand: Max assist;+2 physical assistance;+2 safety/equipment           General transfer comment: standing with RW unsuccessful so used stedy with help of two    Ambulation/Gait                   Stairs             Wheelchair Mobility    Modified Rankin (Stroke Patients Only)       Balance Overall balance assessment: Needs assistance Sitting-balance support: Feet supported;Bilateral upper extremity supported Sitting balance-Leahy Scale: Poor Sitting balance - Comments: supporting trunk to remain upright Postural control: Left lateral lean Standing balance support: Bilateral upper extremity supported;Reliant on assistive device for balance Standing balance-Leahy Scale: Poor Standing balance comment: two person help to get stedy under pt                            Cognition Arousal/Alertness: Awake/alert Behavior During Therapy: Flat affect Overall Cognitive Status: History of cognitive impairments - at baseline                                 General Comments: willing to help with transition to side of bed and to chair        Exercises      General Comments General comments (skin integrity, edema, etc.): pt is painful to flex LLE and move but is limited by both immobility  and skin breakdown on LE's      Pertinent Vitals/Pain Pain Assessment: Faces Faces Pain Scale: Hurts even more Breathing: occasional labored breathing, short period of hyperventilation Negative Vocalization: occasional moan/groan, low speech, negative/disapproving quality Facial Expression: smiling or inexpressive Body Language: relaxed Consolability: no need to console PAINAD Score: 2 Pain Location: LLE pain Pain Descriptors / Indicators: Grimacing;Moaning Pain Intervention(s): Limited activity within patient's tolerance;Repositioned    Home Living                           Prior Function            PT Goals (current goals can now be found in the care plan section) Acute Rehab PT Goals Patient Stated Goal: none stated Progress towards PT goals: Progressing toward goals    Frequency    Min 2X/week      PT Plan Discharge plan needs to be updated    Co-evaluation PT/OT/SLP Co-Evaluation/Treatment: Yes Reason for Co-Treatment: For patient/therapist safety;To address functional/ADL transfers          AM-PAC PT "6 Clicks" Mobility   Outcome Measure  Help needed turning from your back to your side while in a flat bed without using bedrails?: A Lot Help needed moving from lying on your back to sitting on the side of a flat bed without using bedrails?: A Lot Help needed moving to and from a bed to a chair (including a wheelchair)?: Total Help needed standing up from a chair using your arms (e.g., wheelchair or bedside chair)?: Total Help needed to walk in hospital room?: Total Help needed climbing 3-5 steps with a railing? : Total 6 Click Score: 8    End of Session   Activity Tolerance: Patient limited by pain Patient left: in chair;with call bell/phone within reach;with chair alarm set Nurse Communication: Mobility status PT Visit Diagnosis: Unsteadiness on feet (R26.81);Muscle weakness (generalized) (M62.81);Pain Pain - Right/Left: Left Pain - part of body: Hip;Knee     Time: 7902-4097 PT Time Calculation (min) (ACUTE ONLY): 23 min  Charges:  $Therapeutic Activity: 8-22 mins             Ivar Drape 11/02/2021, 6:40 PM  Samul Dada, PT PhD Acute Rehab Dept. Number: [facility] [id number] and [facility] [phone]

## 2021-11-02 NOTE — Discharge Summary (Addendum)
Physician Discharge Summary  [name] [name] [id number] DOB: November 14, 1935 DOA: 10/20/2021  PCP: Raymondo Band, MD  Admit date: 10/20/2021 Discharge date: 11/05/2021  Admitted From: SNF  Disposition:  SNF  Recommendations for Outpatient Follow-up and new medication changes:  Follow up with Dr. Keenan Bachelor in 7 to 10 days.  Aspiration precautions.   I spoke over the phone with the patient's wife about patient's  condition, plan of care, prognosis and all questions were addressed.   Home Health: na   Equipment/Devices: na    Discharge Condition: stable  CODE STATUS: full  Diet recommendation:  dysphagia 3 diet with aspiration precautions.   Brief/Interim Summary: [name] [name] was admitted to the hospital with the working diagnosis of bilateral pneumonia in the setting of COVID viral pneumonia.  Prolonged hospitalization.    86 year old male past medical history for dementia, seizures, BPH and gastrointestinal bleed who presented with respiratory distress.  At SNF patient having significant cough, concern about aspiration.  Recent SARS COVID-19 infection May 2022.  On his initial physical examination blood pressure 135/78, heart rate 97, respiratory rate 25, oxygen saturation 98%, his lungs had expiratory wheezing, no rales, heart S1-S2, present, rhythmic, soft abdomen, no lower extremity edema.  Patient responded to his name but not following commands.   Sodium 141, potassium 3.9, chloride 107, bicarb 27, glucose 125, BUN 27, creatinine 1.0, white count 4.7, hemoglobin 11.1, hematocrit 35.7, platelets 217. SARS COVID-19 positive   Urinalysis specific gravity 1.033, negative leukocytes.   Chest radiograph with bilateral lower lobe infiltrates.   EKG 99 bpm, left axis deviation, left anterior fascicular block, right bundle branch block, sinus rhythm, no significant ST segment or T wave changes.   Patient received antibiotic therapy and supplemental oxygen per nasal cannula. He did  received COVID-specific therapy. Severe deconditioning and swallow dysfunction. Nasogastric tube was placed for feedings.   Prolonged hospitalization, slowly improving, now on dysphagia 2 diet. He has completed antibiotic therapy.  His son would like to change to different SNF.   Bilateral lower lobe pneumonia, bacterial over infection in the setting of SARS COVID 19 viral infection.  Patient had a prolonged hospitalization, he was treated with antibiotic therapy for bacterial pneumonia, he also received specific COVID-19 therapy including remdesivir. He did not receive systemic corticosteroids. His inflammatory markers we will follow closely.  He remains very weak and deconditioned, developed swallow dysfunction required nasogastric tube for tube feeds.  At the time of his discharge she has completed all his antibiotic therapy.  His oxygen saturation is 94-98% on room air.  2.  Dementia, swallow dysfunction.  Patient has advanced dementia, positive cognitive impairment, patient will continue taking memantine and risperidone. He will be transferred back to skilled nursing facility to continue physical therapy and nursing care. Continue aspiration precautions, dysphagia 2 diet.  Palliative care services were consulted.  Discussion about DNR took place.  Patient will continue full code for now.   3.  Hypertension/dyslipidemia.  His blood pressure remained well controlled during hospitalization. Considering his poor prognosis because of advanced dementia statins currently on hold.  4.  Seizures.  No active seizures during his hospitalization.  Continue valproic acid and Keppra.  5.  Chronic anemia.  His cell count remained stable, likely anemia of chronic disease.  Follow-up cell count as needed. His discharge hemoglobin is 10.6, hematocrit 34.2.  6.  Stage I bilateral heels, pressure injury stage I.  On define if present on admission or not.  Patient had protective boots in place  and  local skin care. He had tramadol as needed for pain.  7. BPH. No urinary retention, continue with tamsulosin.   Discharge Diagnoses:  Principal Problem:   Pneumonia Active Problems:   Dyslipidemia   Essential hypertension   Dementia ([place])   Seizure ([place])   COVID    Discharge Instructions   Allergies as of 11/02/2021       Reactions   Chocolate    Itching if he eats too much chocolate    Ciprofloxacin    Achilles tendon pain    Lipitor [atorvastatin]    Muscle aches         Medication List     STOP taking these medications    midodrine 5 MG tablet Commonly known as: PROAMATINE       TAKE these medications    acetaminophen 500 MG tablet Commonly known as: TYLENOL Take 1,000 mg by mouth 3 (three) times daily.   Azopt 1 % ophthalmic suspension Generic drug: brinzolamide Place 1 drop into both eyes 3 (three) times daily.   CO Q 10 PO Take 30 mg by mouth daily.   divalproex 500 MG 24 hr tablet Commonly known as: Depakote ER Take 2 tablets (1,000 mg total) by mouth daily. Take 1 tablet every night What changed:  when to take this additional instructions   donepezil 10 MG tablet Commonly known as: ARICEPT Take 10 mg by mouth at bedtime.   fish oil-omega-3 fatty acids 1000 MG capsule Take 1 g by mouth daily.   Glucosamine-Chondroitin 500-400 MG Caps Take 1 tablet by mouth daily.   latanoprost 0.005 % ophthalmic solution Commonly known as: XALATAN Place 1 drop into both eyes at bedtime.   levETIRAcetam 500 MG tablet Commonly known as: KEPPRA TAKE 1 TABLET (500 MG TOTAL) BY MOUTH TWO TIMES DAILY. What changed: how much to take   melatonin 5 MG Tabs Take 10 mg by mouth at bedtime.   memantine 10 MG tablet Commonly known as: NAMENDA Take 1 tablet twice a day What changed:  how much to take how to take this when to take this additional instructions   MILK THISTLE PO Take 140 mg by mouth daily.   multivitamin with minerals tablet Take  1 tablet by mouth daily.   NON FORMULARY 250 mLs See admin instructions. Give extra 250 ml of fluid every shift for hypenatremia   polyethylene glycol 17 g packet Commonly known as: MIRALAX / GLYCOLAX Take 17 g by mouth daily.   Rhopressa 0.02 % Soln Generic drug: Netarsudil Dimesylate Place 1 drop into both eyes at bedtime.   risperiDONE 0.5 MG tablet Commonly known as: RISPERDAL Take 1 tablet (0.5 mg total) by mouth at bedtime.   sennosides-docusate sodium 8.6-50 MG tablet Commonly known as: SENOKOT-S Take 2 tablets by mouth at bedtime.   tamsulosin 0.4 MG Caps capsule Commonly known as: FLOMAX Take 0.4 mg by mouth every other day.               Discharge Care Instructions  (From admission, onward)           Start     Ordered   11/02/21 0000  Discharge wound care:       Comments: Wound care to bilateral heel deep tissue pressure injuries: Cleanse with NS, pat dry. Cover with folded pieces of xeroform gauze ([id number]) top with dry gauze, ABD pad and secure with a few turns of Kerlix roll gauze/paper tape. Place feet into bilateral pressure redistribution heel boots (  Prevalon).   11/02/21 0950            Allergies  Allergen Reactions   Chocolate     Itching if he eats too much chocolate    Ciprofloxacin     Achilles tendon pain    Lipitor [Atorvastatin]     Muscle aches     Consultations: Palliative care   Procedures/Studies: DG Abd 1 View  Result Date: 10/23/2021 CLINICAL DATA:  Nasogastric tube placement. EXAM: ABDOMEN - 1 VIEW COMPARISON:  August 24, 2014. FINDINGS: Nasogastric tube tip is seen in the expected position of the stomach. No abnormal bowel dilatation is noted. Residual contrast is noted in the colon. IMPRESSION: Nasogastric tube tip seen in expected position of the stomach. No abnormal bowel dilatation is noted. Electronically Signed   By: Marijo Conception M.D.   On: 10/23/2021 14:29   CT HEAD WO CONTRAST (5MM)  Result Date:  10/25/2021 CLINICAL DATA:  Altered mental status. EXAM: CT HEAD WITHOUT CONTRAST TECHNIQUE: Contiguous axial images were obtained from the base of the skull through the vertex without intravenous contrast. COMPARISON:  CT head 05/07/2021 FINDINGS: Brain: No evidence of acute infarction, hemorrhage, hydrocephalus, extra-axial collection or mass lesion/mass effect. Advanced cortical volume loss. Patchy hypodensities in the periventricular and subcortical white matter, likely secondary to chronic microvascular ischemic changes. Vascular: Calcified plaques in the carotid siphons. Skull: Normal. Negative for fracture or focal lesion. Sinuses/Orbits: Mild-to-moderate mucosal thickening in the paranasal sinuses. Other: None. IMPRESSION: Chronic changes with no acute intracranial process identified. Electronically Signed   By: Ofilia Neas M.D.   On: 10/25/2021 13:25   DG CHEST PORT 1 VIEW  Result Date: 10/25/2021 CLINICAL DATA:  Pneumonia EXAM: PORTABLE CHEST 1 VIEW COMPARISON:  Previous studies including the examination of 10/20/2021 FINDINGS: Transverse diameter of heart is enlarged. Enteric tube is noted in place. There is homogeneous opacity in the left lower lung field obscuring the left hemidiaphragm which may be due to pleural effusion and underlying atelectasis/pneumonia. There are patchy infiltrates in left parahilar region, right parahilar region and right lower lung fields mid some interval worsening. There is blunting of both lateral CP angles. There is no pneumothorax. IMPRESSION: Cardiomegaly. Homogeneous opacity in left lower lung fields suggests pleural effusion and underlying atelectasis/pneumonia. There are patchy infiltrates in both parahilar regions and right lower lung fields with interval worsening Electronically Signed   By: Elmer Picker M.D.   On: 10/25/2021 10:22   DG Chest Port 1 View  Result Date: 10/20/2021 CLINICAL DATA:  Questionable sepsis. EXAM: PORTABLE CHEST 1 VIEW  COMPARISON:  Chest x-ray 05/18/2021. FINDINGS: There is patchy multifocal airspace disease throughout the right mid and lower lung. There is airspace consolidation in the left lower lung with small left pleural effusion. There is no evidence for pneumothorax. The heart is enlarged, unchanged. No acute fractures are seen. IMPRESSION: 1. Bilateral lower lung airspace disease, left greater than right, concerning for infection. 2. Small left pleural effusion. 3. Stable cardiomegaly. Electronically Signed   By: Ronney Asters M.D.   On: 10/20/2021 20:51   DG Abd Portable 1V  Result Date: 10/25/2021 CLINICAL DATA:  Feeding tube placement. EXAM: PORTABLE ABDOMEN - 1 VIEW COMPARISON:  October 23, 2021. FINDINGS: The bowel gas pattern is normal. Residual contrast is seen in the colon. Distal tip of feeding tube is seen in expected position of distal stomach. No radio-opaque calculi or other significant radiographic abnormality are seen. IMPRESSION: Distal tip of feeding tube seen in expected  position of distal stomach. Electronically Signed   By: Marijo Conception M.D.   On: 10/25/2021 17:29   DG Swallowing Func-Speech Pathology  Result Date: 10/28/2021 Table formatting from the original result was not included. Objective Swallowing Evaluation: Type of Study: MBS-Modified Barium Swallow Study  Patient Details Name: [name] [name] MRN: [id number] Date of Birth: 1935-09-04 Today's Date: 10/28/2021 Time: SLP Start Time (ACUTE ONLY): 1400 -SLP Stop Time (ACUTE ONLY): [id number] SLP Time Calculation (min) (ACUTE ONLY): 20 min Past Medical History: Past Medical History: Diagnosis Date  Allergy   Dementia ([place])   sees Dr. Ellouise Newer   Diverticulosis of colon (without mention of hemorrhage) 03-01-2004, 04-04-2011  Colonoscopy  ED (erectile dysfunction)   Glaucoma   sees Dr. Crecencio Mc   Hyperglycemia   Hyperlipidemia   Hypertension   Internal hemorrhoids 03-15-1999  Flex Sig   Irritable bowel  Past Surgical History: Past Surgical  History: Procedure Laterality Date  COLONOSCOPY  04-04-11  per Dr. Sharlett Iles, clear, no repeats needed  EYE SURGERY    bilateral cataract extraction per Dr Dolores Lory  INGUINAL HERNIA REPAIR    KNEE ARTHROSCOPY    right knee  NASAL SINUS SURGERY   HPI: Pt is an 86 y/o male admitted 10/26 from nursing home with respiratory distress/concern for aspiration. CXR 10/26: Bilateral lower lung airspace disease, left greater than right, concerning for infection. PMT 11/3: Family is open to all offered and available medical interventions to prolong life. PMH: dementia, seizures, BPH, GI bleed, HTN, HLD, hyperglycemia.  No data recorded Assessment / Plan / Recommendation CHL IP CLINICAL IMPRESSIONS 10/28/2021 Clinical Impression Pt presents with oropharyngeal dysphagia characterized by decreased bolus cohesion, a pharyngeal delay, reduced lingual retraction, and reduced anterior laryngeal movement. He demonstrate mild vallecular residue and moderate pyriform sinus residue. Residue increased with bolus size and with advancement of solid bolus consistency. Residue was eliminated with consecutive swallows of thin liquids. However, epiglottic inversion was inconsistent with liquids. Incomplete epiglottic inversion, combined with the presence of pyriform sinus residue, and reduced distension of the pharyngoesophageal segment resulted in penetration (PAS 5) and sensed aspiration (PAS 7) of thin liquids and nectar thick liquids. Coughing was effective in mobilizing penetrate and in expelling less severe (PAS 3) instances of trace penetration. Laryngeal invasion was eliminated with reduced bolus sizes of nectar thick liquids via cup and with cued secondary swallows; however, pt demonstrated difficulty with consistency of the latter. Pt exhibited impulsive tendencies which places him at increased risk for aspiration of liquids given his pharyngeal delay. A dysphagia 2 diet with honey thick liquids will be initiated at this time. SLP will follow  for dysphagia treatment, and advancement as clinically indicated. SLP Visit Diagnosis Dysphagia, oropharyngeal phase (R13.12) Attention and concentration deficit following -- Frontal lobe and executive function deficit following -- Impact on safety and function Moderate aspiration risk   CHL IP TREATMENT RECOMMENDATION 10/28/2021 Treatment Recommendations Therapy as outlined in treatment plan below   Prognosis 10/28/2021 Prognosis for Safe Diet Advancement Fair Barriers to Reach Goals Severity of deficits;Cognitive deficits Barriers/Prognosis Comment -- CHL IP DIET RECOMMENDATION 10/28/2021 SLP Diet Recommendations Dysphagia 2 (Fine chop) solids;Honey thick liquids Liquid Administration via Cup;No straw Medication Administration Via alternative means Compensations Minimize environmental distractions;Slow rate;Small sips/bites;Follow solids with liquid Postural Changes Seated upright at 90 degrees   CHL IP OTHER RECOMMENDATIONS 10/28/2021 Recommended Consults -- Oral Care Recommendations Staff/trained caregiver to provide oral care;Oral care BID Other Recommendations --   CHL IP FOLLOW UP RECOMMENDATIONS 10/28/2021  Follow up Recommendations Skilled Nursing facility   [facility] IP FREQUENCY AND DURATION 10/28/2021 Speech Therapy Frequency (ACUTE ONLY) min 2x/week Treatment Duration 2 weeks      CHL IP ORAL PHASE 10/28/2021 Oral Phase Impaired Oral - Pudding Teaspoon -- Oral - Pudding Cup -- Oral - Honey Teaspoon -- Oral - Honey Cup Premature spillage;Decreased bolus cohesion Oral - Nectar Teaspoon -- Oral - Nectar Cup Premature spillage;Decreased bolus cohesion Oral - Nectar Straw Premature spillage;Decreased bolus cohesion Oral - Thin Teaspoon NT Oral - Thin Cup Premature spillage;Decreased bolus cohesion Oral - Thin Straw Premature spillage;Decreased bolus cohesion Oral - Puree WFL Oral - Mech Soft -- Oral - Regular WFL Oral - Multi-Consistency -- Oral - Pill (No Data) Oral Phase - Comment --  CHL IP PHARYNGEAL PHASE 10/28/2021  Pharyngeal Phase Impaired Pharyngeal- Pudding Teaspoon -- Pharyngeal -- Pharyngeal- Pudding Cup -- Pharyngeal -- Pharyngeal- Honey Teaspoon NT Pharyngeal -- Pharyngeal- Honey Cup Pharyngeal residue - pyriform;Pharyngeal residue - valleculae;Reduced laryngeal elevation;Reduced anterior laryngeal mobility;Reduced tongue base retraction Pharyngeal -- Pharyngeal- Nectar Teaspoon NT Pharyngeal -- Pharyngeal- Nectar Cup Pharyngeal residue - pyriform;Pharyngeal residue - valleculae;Reduced laryngeal elevation;Reduced anterior laryngeal mobility;Reduced tongue base retraction;Penetration/Aspiration during swallow;Penetration/Apiration after swallow Pharyngeal Material enters airway, passes BELOW cords and not ejected out despite cough attempt by patient;Material enters airway, CONTACTS cords and not ejected out Pharyngeal- Nectar Straw Pharyngeal residue - pyriform;Pharyngeal residue - valleculae;Reduced laryngeal elevation;Reduced anterior laryngeal mobility;Reduced tongue base retraction;Penetration/Aspiration during swallow;Penetration/Apiration after swallow Pharyngeal Material enters airway, passes BELOW cords and not ejected out despite cough attempt by patient Pharyngeal- Thin Teaspoon NT Pharyngeal -- Pharyngeal- Thin Cup Pharyngeal residue - pyriform;Pharyngeal residue - valleculae;Reduced laryngeal elevation;Reduced anterior laryngeal mobility;Reduced tongue base retraction;Moderate aspiration;Penetration/Apiration after swallow;Penetration/Aspiration during swallow Pharyngeal -- Pharyngeal- Thin Straw Pharyngeal residue - pyriform;Pharyngeal residue - valleculae;Reduced laryngeal elevation;Reduced anterior laryngeal mobility;Reduced tongue base retraction;Penetration/Aspiration during swallow;Penetration/Apiration after swallow Pharyngeal Material enters airway, passes BELOW cords and not ejected out despite cough attempt by patient;Material enters airway, CONTACTS cords and not ejected out Pharyngeal- Puree  Pharyngeal residue - pyriform;Pharyngeal residue - valleculae;Reduced laryngeal elevation;Reduced anterior laryngeal mobility;Reduced tongue base retraction Pharyngeal -- Pharyngeal- Mechanical Soft -- Pharyngeal -- Pharyngeal- Regular Pharyngeal residue - pyriform;Pharyngeal residue - valleculae;Reduced laryngeal elevation;Reduced anterior laryngeal mobility;Reduced tongue base retraction Pharyngeal -- Pharyngeal- Multi-consistency -- Pharyngeal -- Pharyngeal- Pill -- Pharyngeal -- Pharyngeal Comment --  CHL IP CERVICAL ESOPHAGEAL PHASE 10/28/2021 Cervical Esophageal Phase -- Pudding Teaspoon -- Pudding Cup -- Honey Teaspoon -- Honey Cup -- Nectar Teaspoon -- Nectar Cup -- Nectar Straw -- Thin Teaspoon -- Thin Cup Reduced cricopharyngeal relaxation Thin Straw -- Puree -- Mechanical Soft -- Regular -- Multi-consistency -- Pill -- Cervical Esophageal Comment -- Shanika I. Hardin Negus, [place], [place] Office number [phone] Pager [place] 10/28/2021, 4:12 PM              DG Swallowing Func-Speech Pathology  Result Date: 10/22/2021 Table formatting from the original result was not included. Objective Swallowing Evaluation: Type of Study: MBS-Modified Barium Swallow Study  Patient Details Name: [name] [name] MRN: [id number] Date of Birth: 02-07-1935 Today's Date: 10/22/2021 Time: SLP Start Time (ACUTE ONLY): 49 -SLP Stop Time (ACUTE ONLY): [id number] SLP Time Calculation (min) (ACUTE ONLY): 18 min Past Medical History: Past Medical History: Diagnosis Date  Allergy   Dementia [facility])   sees Dr. Ellouise Newer   Diverticulosis of colon (without mention of hemorrhage) 03-01-2004, 04-04-2011  Colonoscopy  ED (erectile dysfunction)   Glaucoma   sees  Dr. Crecencio Mc   Hyperglycemia   Hyperlipidemia   Hypertension   Internal hemorrhoids 03-15-1999  Flex Sig   Irritable bowel  Past Surgical History: Past Surgical History: Procedure Laterality Date  COLONOSCOPY  04-04-11  per Dr.  Sharlett Iles, clear, no repeats needed  EYE SURGERY    bilateral cataract extraction per Dr Dolores Lory  INGUINAL HERNIA REPAIR    KNEE ARTHROSCOPY    right knee  NASAL SINUS SURGERY   HPI: Pt is an 86 y/o male admitted 10/26 from nursing home with respiratory distress/concern for aspiration. CXR 10/26: Bilateral lower lung airspace disease, left greater than right, concerning for infection. PMH: dementia, seizures, BPH, GI bleed, HTN, HLD, hyperglycemia.  No data recorded Assessment / Plan / Recommendation CHL IP CLINICAL IMPRESSIONS 10/22/2021 Clinical Impression Pt presents with oropharyngeal dysphagia with significant impacts related to reduced mentation and deconditioning. Pt lethargic and with reduced attention during testing this date and follwing of commands was intermittent, requiring consistent cueing from clinician. Swallow function c/b premature spillage of liquids, reduced pharyngeal peristalsis and UES opening resulting in residuals of all consistencies in the vallecular space and pyriform sinuses. Aspiration of thin and NTL noted (each x1) after the swallow with pyriform residuals falling into the airway, passing below the cords and pt demonstrating immediate cough response (PAS 7). Pharyngeal residuals increased with thicker consistencies with posterior face of epiglottis coated in significant residue post HTL trial. As testing progressed and pt's positioning reduced, view of laryngeal vestibule was reduced and clinician unable to determine any further aspiration events, however pt's risk is high.  Recommend NPO with ice chips after thorough oral care at this time. Short term alternative means of nutrition may need to be considered given pt's mentation and deconditioning. SLP to f/u for PO readiness.  SLP Visit Diagnosis Dysphagia, oropharyngeal phase (R13.12) Attention and concentration deficit following -- Frontal lobe and executive function deficit following -- Impact on safety and function Severe  aspiration risk   CHL IP TREATMENT RECOMMENDATION 10/22/2021 Treatment Recommendations Therapy as outlined in treatment plan below;F/U MBS in --- days (Comment)   Prognosis 10/22/2021 Prognosis for Safe Diet Advancement Fair Barriers to Reach Goals Severity of deficits;Cognitive deficits Barriers/Prognosis Comment -- CHL IP DIET RECOMMENDATION 10/22/2021 SLP Diet Recommendations NPO;Ice chips PRN after oral care Liquid Administration via Spoon Medication Administration Via alternative means Compensations Minimize environmental distractions;Slow rate;Small sips/bites;Clear throat intermittently Postural Changes Seated upright at 90 degrees;Remain semi-upright after after feeds/meals (Comment)   CHL IP OTHER RECOMMENDATIONS 10/22/2021 Recommended Consults -- Oral Care Recommendations Oral care QID;Staff/trained caregiver to provide oral care Other Recommendations --   CHL IP FOLLOW UP RECOMMENDATIONS 10/22/2021 Follow up Recommendations Skilled Nursing facility   [facility] IP FREQUENCY AND DURATION 10/22/2021 Speech Therapy Frequency (ACUTE ONLY) min 2x/week Treatment Duration 2 weeks      CHL IP ORAL PHASE 10/22/2021 Oral Phase Impaired Oral - Pudding Teaspoon -- Oral - Pudding Cup -- Oral - Honey Teaspoon -- Oral - Honey Cup -- Oral - Nectar Teaspoon -- Oral - Nectar Cup -- Oral - Nectar Straw -- Oral - Thin Teaspoon -- Oral - Thin Cup Premature spillage Oral - Thin Straw Premature spillage Oral - Puree -- Oral - Mech Soft -- Oral - Regular -- Oral - Multi-Consistency -- Oral - Pill -- Oral Phase - Comment --  CHL IP PHARYNGEAL PHASE 10/22/2021 Pharyngeal Phase Impaired Pharyngeal- Pudding Teaspoon -- Pharyngeal -- Pharyngeal- Pudding Cup -- Pharyngeal -- Pharyngeal- Honey Teaspoon -- Pharyngeal -- Pharyngeal- Honey Cup  Pharyngeal residue - valleculae;Pharyngeal residue - pyriform;Reduced pharyngeal peristalsis Pharyngeal -- Pharyngeal- Nectar Teaspoon -- Pharyngeal -- Pharyngeal- Nectar Cup Reduced pharyngeal  peristalsis;Penetration/Apiration after swallow;Moderate aspiration;Pharyngeal residue - valleculae;Pharyngeal residue - pyriform Pharyngeal Material enters airway, passes BELOW cords and not ejected out despite cough attempt by patient Pharyngeal- Nectar Straw Reduced pharyngeal peristalsis;Penetration/Apiration after swallow;Moderate aspiration;Pharyngeal residue - valleculae;Pharyngeal residue - pyriform Pharyngeal Material enters airway, passes BELOW cords and not ejected out despite cough attempt by patient Pharyngeal- Thin Teaspoon -- Pharyngeal -- Pharyngeal- Thin Cup -- Pharyngeal -- Pharyngeal- Thin Straw Reduced pharyngeal peristalsis;Penetration/Apiration after swallow;Moderate aspiration;Pharyngeal residue - valleculae;Pharyngeal residue - pyriform Pharyngeal Material enters airway, passes BELOW cords and not ejected out despite cough attempt by patient Pharyngeal- Puree Pharyngeal residue - valleculae;Pharyngeal residue - pyriform;Reduced pharyngeal peristalsis Pharyngeal -- Pharyngeal- Mechanical Soft -- Pharyngeal -- Pharyngeal- Regular NT Pharyngeal -- Pharyngeal- Multi-consistency -- Pharyngeal -- Pharyngeal- Pill NT Pharyngeal -- Pharyngeal Comment --  No flowsheet data found. Avie Echevaria, MA, CCC-SLP Acute Rehabilitation Services Office Number: [phone] Paulette Blanch 10/22/2021, 4:19 PM              VAS [place] LOWER EXTREMITY VENOUS (DVT)  Result Date: 10/24/2021  Lower Venous DVT Study Patient Name:  [name] [name]  Date of Exam:   10/23/2021 Medical Rec #: [id number]         Accession #:    [id number] Date of Birth: August 18, 1935         Patient Gender: M Patient Age:   58 years Exam Location:  [facility] Procedure:      VAS [place] LOWER EXTREMITY VENOUS (DVT) Referring Phys: Dorcas Carrow --------------------------------------------------------------------------------  Indications: Swelling, and Covid.  Limitations: Patient would not allow tech to reposition him. Comparison Study:  Prior negative LEV done 05/08/21 Performing Technologist: Jean Rosenthal RDMS, RVT  Examination Guidelines: A complete evaluation includes B-mode imaging, spectral Doppler, color Doppler, and power Doppler as needed of all accessible portions of each vessel. Bilateral testing is considered an integral part of a complete examination. Limited examinations for reoccurring indications may be performed as noted. The reflux portion of the exam is performed with the patient in reverse Trendelenburg.  +---------+---------------+---------+-----------+----------+-------------------+ RIGHT    CompressibilityPhasicitySpontaneityPropertiesThrombus Aging      +---------+---------------+---------+-----------+----------+-------------------+ CFV      Full                                                             +---------+---------------+---------+-----------+----------+-------------------+ SFJ      Full                                                             +---------+---------------+---------+-----------+----------+-------------------+ FV Prox  Full                                                             +---------+---------------+---------+-----------+----------+-------------------+ FV Mid   Full                                                             +---------+---------------+---------+-----------+----------+-------------------+  FV DistalFull                                                             +---------+---------------+---------+-----------+----------+-------------------+ PFV      Full                                                             +---------+---------------+---------+-----------+----------+-------------------+ POP      Full                                                             +---------+---------------+---------+-----------+----------+-------------------+ PTV                                                   Not well  visualized +---------+---------------+---------+-----------+----------+-------------------+ PERO     Full                                                             +---------+---------------+---------+-----------+----------+-------------------+   +---------+---------------+---------+-----------+----------+--------------+ LEFT     CompressibilityPhasicitySpontaneityPropertiesThrombus Aging +---------+---------------+---------+-----------+----------+--------------+ CFV      Full                                                        +---------+---------------+---------+-----------+----------+--------------+ SFJ      Full                                                        +---------+---------------+---------+-----------+----------+--------------+ FV Prox  Full                                                        +---------+---------------+---------+-----------+----------+--------------+ FV Mid   Full                                                        +---------+---------------+---------+-----------+----------+--------------+ FV DistalFull                                                        +---------+---------------+---------+-----------+----------+--------------+  PFV      Full                                                        +---------+---------------+---------+-----------+----------+--------------+ POP      Full                                                        +---------+---------------+---------+-----------+----------+--------------+ PTV      Full                                                        +---------+---------------+---------+-----------+----------+--------------+ PERO     Full                                                        +---------+---------------+---------+-----------+----------+--------------+     Summary: RIGHT: - There is no evidence of deep vein thrombosis in the lower extremity.  However, portions of this examination were limited- see technologist comments above.  pulsatile waveforms noted throughout  LEFT: - There is no evidence of deep vein thrombosis in the lower extremity.  - A cystic structure is found in the popliteal fossa. Pulsatile waveforms noted throughout.  *See table(s) above for measurements and observations. Electronically signed by Jamelle Haring on 10/24/2021 at 9:30:10 AM.    Final        Subjective: Patient continues to be confused and disorientated, likely at his baseline, he is able to responds simple questions and follow commands.  He has been tolerating po well.   Discharge Exam: Vitals:   11/02/21 0358 11/02/21 0750  BP: 116/72 (!) 147/78  Pulse: 70 63  Resp: 20 20  Temp: 97.7 F (36.5 C) 97.7 F (36.5 C)  SpO2: 98% 94%   Vitals:   11/01/21 1412 11/01/21 2057 11/02/21 0358 11/02/21 0750  BP: (!) 159/88 (!) 164/96 116/72 (!) 147/78  Pulse: 88 79 70 63  Resp: 17 18 20 20   Temp: 98.5 F (36.9 C) 98.6 F (37 C) 97.7 F (36.5 C) 97.7 F (36.5 C)  TempSrc: Oral Axillary Oral Oral  SpO2: 97% 96% 98% 94%  Weight:      Height:        General: Not in pain or dyspnea, deconditioned  Neurology: Awake and alert, confused and disorientated but able to follow commands and responds to simple questions.  E ENT: mild pallor, no icterus, oral mucosa moist Cardiovascular: No JVD. S1-S2 present, rhythmic, no gallops, rubs, or murmurs. No lower extremity edema. Pulmonary: positive breath sounds bilaterally, adequate air movement, no wheezing, rhonchi or rales. Gastrointestinal. Abdomen soft and non tender Skin. No rashes Musculoskeletal: no joint deformities   The results of significant diagnostics from this hospitalization (including imaging, microbiology, ancillary and laboratory) are listed below for reference.     Microbiology: Recent Results (from the past 240  hour(s))  MRSA Next Gen by PCR, Nasal     Status: Abnormal   Collection  Time: 10/25/21 12:34 PM   Specimen: Nasal Mucosa; Nasal Swab  Result Value Ref Range Status   MRSA by PCR Next Gen DETECTED (A) NOT DETECTED Final    Comment: RESULT CALLED TO, READ BACK BY AND VERIFIED WITH: Joesphine Bare RN [id number] 10/25/21 A BROWNING (NOTE) The GeneXpert MRSA Assay (FDA approved for NASAL specimens only), is one component of a comprehensive MRSA colonization surveillance program. It is not intended to diagnose MRSA infection nor to guide or monitor treatment for MRSA infections. Test performance is not FDA approved in patients less than 73 years old. Performed at [facility], [place] [address], [place], [place] [address]      Labs: BNP (last 3 results) Recent Labs    05/09/21 0232  BNP [id number]*   Basic Metabolic Panel: Recent Labs  Lab 10/28/21 0309 10/29/21 0235 10/30/21 0636  NA 141 141 139  K 4.0 4.1 5.1  CL 104 104 100  CO2 32 31 32  GLUCOSE 150* 122* 121*  BUN 18 16 19   CREATININE 0.64 0.61 0.71  CALCIUM 8.3* 8.2* 8.2*   Liver Function Tests: Recent Labs  Lab 10/28/21 0309  AST 22  ALT 19  ALKPHOS 54  BILITOT 0.3  PROT 4.8*  ALBUMIN 1.7*   No results for input(s): LIPASE, AMYLASE in the last 168 hours. No results for input(s): AMMONIA in the last 168 hours. CBC: Recent Labs  Lab 10/28/21 0309  WBC 6.3  HGB 10.6*  HCT 34.2*  MCV 88.4  PLT 335   Cardiac Enzymes: No results for input(s): CKTOTAL, CKMB, CKMBINDEX, TROPONINI in the last 168 hours. BNP: Invalid input(s): POCBNP CBG: Recent Labs  Lab 11/01/21 1624 11/01/21 2105 11/02/21 0013 11/02/21 0356 11/02/21 0818  GLUCAP 113* 75 95 82 89   D-Dimer No results for input(s): DDIMER in the last 72 hours. Hgb A1c No results for input(s): HGBA1C in the last 72 hours. Lipid Profile No results for input(s): CHOL, HDL, LDLCALC, TRIG, CHOLHDL, LDLDIRECT in the last 72 hours. Thyroid function studies No results for input(s): TSH, T4TOTAL, T3FREE, THYROIDAB in the last 72  hours.  Invalid input(s): FREET3 Anemia work up No results for input(s): VITAMINB12, FOLATE, FERRITIN, TIBC, IRON, RETICCTPCT in the last 72 hours. Urinalysis    Component Value Date/Time   COLORURINE AMBER (A) 10/20/2021 2029   APPEARANCEUR CLEAR 10/20/2021 2029   LABSPEC 1.033 (H) 10/20/2021 2029   PHURINE 5.0 10/20/2021 2029   GLUCOSEU NEGATIVE 10/20/2021 2029   GLUCOSEU NEGATIVE 02/06/2020 1032   HGBUR NEGATIVE 10/20/2021 2029   HGBUR negative 09/16/2010 0839   BILIRUBINUR NEGATIVE 10/20/2021 2029   BILIRUBINUR neg 02/22/2019 0958   KETONESUR 20 (A) 10/20/2021 2029   PROTEINUR NEGATIVE 10/20/2021 2029   UROBILINOGEN 0.2 02/06/2020 1032   NITRITE NEGATIVE 10/20/2021 2029   LEUKOCYTESUR NEGATIVE 10/20/2021 2029   Sepsis Labs Invalid input(s): PROCALCITONIN,  WBC,  LACTICIDVEN Microbiology Recent Results (from the past 240 hour(s))  MRSA Next Gen by PCR, Nasal     Status: Abnormal   Collection Time: 10/25/21 12:34 PM   Specimen: Nasal Mucosa; Nasal Swab  Result Value Ref Range Status   MRSA by PCR Next Gen DETECTED (A) NOT DETECTED Final    Comment: RESULT CALLED TO, READ BACK BY AND VERIFIED WITH: Joesphine Bare RN [id number] 10/25/21 A BROWNING (NOTE) The GeneXpert MRSA Assay (FDA approved for NASAL specimens only), is  one component of a comprehensive MRSA colonization surveillance program. It is not intended to diagnose MRSA infection nor to guide or monitor treatment for MRSA infections. Test performance is not FDA approved in patients less than 68 years old. Performed at [facility], [place] [address], [place], [place] [address]      Time coordinating discharge: 45 minutes  SIGNED:   Tawni Millers, MD  [facility] 11/02/2021, 8:42 AM

## 2021-11-02 NOTE — Progress Notes (Signed)
Occupational Therapy Treatment Patient Details Name: [name] [name] MRN: [id number] DOB: 04/29/1935 Today's Date: 11/02/2021   History of present illness 86 y/o male admitted 10/26 in respiratory distress with rhonchi and coughing, concern for aspiration.  Pt was initially requiring 2L O2 to maintain sats.  Covid check on admission came back positive.  PMHx:  dementia, glaucoma, HTN,   OT comments  Patient received in bed and agreeable to participate in OT/PT services. Patient instructed on bed mobility to get to eob with mod assist +2 due to assistance needed with BLEs and trunk.  Patient was able to perform light grooming seated on eob with assistance for balance.  Patient attempted to stand into rw with max assist +2 but was unable to come to full/safe stand.  Patient stood into stedy with max assist +2 and was able to transfer to recliner.  Acute OT to continue to follow.    Recommendations for follow up therapy are one component of a multi-disciplinary discharge planning process, led by the attending physician.  Recommendations may be updated based on patient status, additional functional criteria and insurance authorization.    Follow Up Recommendations  Skilled nursing-short term rehab (<3 hours/day)    Assistance Recommended at Discharge Frequent or constant Supervision/Assistance  Equipment Recommendations  None recommended by OT    Recommendations for Other Services      Precautions / Restrictions Precautions Precautions: Fall Restrictions Weight Bearing Restrictions: No       Mobility Bed Mobility Overal bed mobility: Needs Assistance Bed Mobility: Supine to Sit     Supine to sit: Mod assist;+2 for physical assistance     General bed mobility comments: patient required assistance with BLEs and trunk to get to eob    Transfers Overall transfer level: Needs assistance Equipment used: None (stedy) Transfers: Sit to/from Stand Sit to Stand: Max assist;+2 physical  assistance           General transfer comment: attempted to stand at [facility] with patient unable to come to complete stand.  Patient stood into stedy with max assist +2 to transfer to recliner     Balance Overall balance assessment: Needs assistance Sitting-balance support: Feet supported;Bilateral upper extremity supported Sitting balance-Leahy Scale: Poor Sitting balance - Comments: UE assistance needed forsitting balance. Postural control: Left lateral lean Standing balance support: Bilateral upper extremity supported;Reliant on assistive device for balance Standing balance-Leahy Scale: Poor Standing balance comment: stood well enough to get stedy pads behind patient                           ADL either performed or assessed with clinical judgement   ADL Overall ADL's : Needs assistance/impaired     Grooming: Wash/dry hands;Wash/dry face;Sitting Grooming Details (indicate cue type and reason): performed seated on eob                               General ADL Comments: assistance with balance while seatded on EOB for grooming    Extremity/Trunk Assessment Upper Extremity Assessment Upper Extremity Assessment: Defer to OT evaluation            Vision       Perception     Praxis      Cognition Arousal/Alertness: Awake/alert Behavior During Therapy: Flat affect Overall Cognitive Status: History of cognitive impairments - at baseline  General Comments: patient was willing to get to eob and transfer to recliner          Exercises     Shoulder Instructions       General Comments      Pertinent Vitals/ Pain       Pain Assessment: Faces Faces Pain Scale: Hurts little more Pain Location: generalized with transfer using stedy Pain Descriptors / Indicators: Grimacing;Moaning Pain Intervention(s): Repositioned  Home Living                                          Prior  Functioning/Environment              Frequency  Min 1X/week        Progress Toward Goals  OT Goals(current goals can now be found in the care plan section)  Progress towards OT goals: Progressing toward goals  Acute Rehab OT Goals Patient Stated Goal: none stated OT Goal Formulation: Patient unable to participate in goal setting Time For Goal Achievement: 11/07/21 Potential to Achieve Goals: Fair ADL Goals Pt Will Perform Eating: with min assist;with adaptive utensils;sitting Pt Will Perform Grooming: with min assist;with adaptive equipment;sitting Pt Will Perform Upper Body Bathing: with min assist;sitting Pt Will Perform Upper Body Dressing: with min assist;sitting Additional ADL Goal #1: Pt will maintain balance EOB for 3 mins to prepare for seated ADL's.  Plan Discharge plan remains appropriate    Co-evaluation    PT/OT/SLP Co-Evaluation/Treatment: Yes Reason for Co-Treatment: For patient/therapist safety   OT goals addressed during session: ADL's and self-care      AM-PAC OT "6 Clicks" Daily Activity     Outcome Measure   Help from another person eating meals?: A Lot Help from another person taking care of personal grooming?: A Lot Help from another person toileting, which includes using toliet, bedpan, or urinal?: Total Help from another person bathing (including washing, rinsing, drying)?: Total Help from another person to put on and taking off regular upper body clothing?: A Lot Help from another person to put on and taking off regular lower body clothing?: Total 6 Click Score: 9    End of Session Equipment Utilized During Treatment: Other (comment) (stedy)  OT Visit Diagnosis: Muscle weakness (generalized) (M62.81);Other abnormalities of gait and mobility (R26.89)   Activity Tolerance Patient tolerated treatment well   Patient Left in chair;with call bell/phone within reach;with chair alarm set   Nurse Communication Need for lift equipment         Time: 1248-1311 OT Time Calculation (min): 23 min  Charges: OT General Charges $OT Visit: 1 Visit OT Treatments $Self Care/Home Management : 8-22 mins  [name], OTA Acute Rehabilitation Services  Pager [phone] Office [phone]   [name] 11/02/2021, 1:23 PM

## 2021-11-02 NOTE — TOC Progression Note (Addendum)
Transition of Care [facility]) - Initial/Assessment Note    Patient Details  Name: [name] [name] MRN: [id number] Date of Birth: 29-Nov-1935  [facility] CM/SW Contact:    Ralene Bathe, LCSWA Phone Number: 11/02/2021, 8:44 AM  Clinical Narrative:                 08:44-  CSW attempted to call Malena Peer with admissions at [facility] to inquire about when the patient can return.  There was no answer.  CSW unable to leave a VM due to the mailbox being full.    09:15-  CSW received information that the patient can return to [facility].    10:00-  CSW received notification from the attending that the patient's son did not want the patient to return to [facility].  CSW called and spoke to the patient's son.  The family expressed concerns about [facility] and asked if the patient could switch facilities.  CSW explained the process and informed the family that CSW would call around to ask about Long Term bed availability.  The family would like for the patient to stay in [place].  CSW contacted the following agencies:   [facility]- no beds available [facility] (only male beds available in [place]) [facility]- no beds available [facility]- no beds available [facility] -[facility] beds available, CSW placed the patient on the waiting list per the family's request. [facility]- No beds available [facility]- [facility] long term beds [facility] (Waiting for a returned call) [facility]- cannot take medicaid pending  11:15-  CSW notified the son of the information above.  He is speaking with family and will inform CSW of decision.   13:00-  CSW informed that the family owes money to the facility and the facility has requested a payment prior to admission.  The family informed CSW that they will contact their attorney for next steps.  15:45-  CSW contacted Malena Peer with [facility] to receive clarification on what is needed prior to admission.  Malena Peer informed CSW that the patient's wife  agreed to pay $7000 prior to admission at a family meeting with the facility yesterday.  16:00-  CSW received a returned call from [facility].  The facility has a LTC bed available.  Due to the patient being medicaid pending, the family will be private pay until insurance is approved.  [facility] is requesting $8000 prior to admission.  16:05-  CSW contacted the family.  The wife informed CSW that the attorney could not advise on the situation, but she would not have the money to pay [facility] until tomorrow.  CSW informed the patient that [facility] has a bed available, but are requesting $8000 prior to admission.  The family will discuss and have CSW a definite answer by tomorrow morning.  The family also requested that the patient receive Skilled Nursing while at the facility.  CSW explained that per the last PT they are recommending Long-term institutional care without follow-up therapy.  The family is still requesting rehab at the facility.  16:15-  CSW notified TOC leadership of the above information.  CSW contacted the PT on the care team today to inquire about Skilled Rehab.   Expected Discharge Plan: Skilled Nursing Facility Barriers to Discharge: Continued Medical Work up   Patient Goals and CMS Choice Patient states their goals for this hospitalization and ongoing recovery are:: unable to discuss covid precautions CMS Medicare.gov Compare Post Acute Care list provided to::  (per wife will return to  [facility]) Choice offered to / list presented to : NA  Expected Discharge Plan and Services Expected Discharge Plan: Skilled Nursing Facility In-house Referral: NA Discharge Planning Services: CM Consult Post Acute Care Choice: NA Living arrangements for the past 2 months: Skilled Nursing Facility                 DME Arranged: N/A DME Agency: NA       HH Arranged: NA HH Agency: NA        Prior Living Arrangements/Services Living arrangements for the past 2  months: Skilled Nursing Facility Lives with:: Facility Resident Patient language and need for interpreter reviewed:: Yes        Need for Family Participation in Patient Care: Yes (Comment) Care giver support system in place?: Yes (comment) Current home services:  (n/a) Criminal Activity/Legal Involvement Pertinent to Current Situation/Hospitalization: No - Comment as needed  Activities of Daily Living      Permission Sought/Granted   Permission granted to share information with : No              Emotional Assessment Appearance::  (unable to assess)   Affect (typically observed): Unable to Assess Orientation: :  (unable to assess)   Psych Involvement: No (comment)  Admission diagnosis:  Pneumonia [J18.9] Aspiration pneumonia of both lower lobes, unspecified aspiration pneumonia type (HCC) [J69.0] COVID [U07.1] Dementia, unspecified dementia severity, unspecified dementia type, unspecified whether behavioral, psychotic, or mood disturbance or anxiety (HCC) [F03.90] Patient Active Problem List   Diagnosis Date Noted   Pneumonia 10/20/2021   Pressure injury of skin 05/11/2021   Pericardial effusion, acute    COVID    Palliative care by specialist    Goals of care, counseling/discussion    General weakness    Syncope 05/07/2021   Seizure (HCC) 03/09/2021   Dementia (HCC) 02/25/2021   BPH with urinary obstruction 12/22/2020   Ankle edema, bilateral 07/13/2020   Hallucinations 02/09/2020   Abdominal pain, acute 08/21/2014   GI bleeding 05/26/2012   Renal mass 05/26/2012   Diverticulosis of colon with hemorrhage 05/26/2012   SHOULDER PAIN, BILATERAL 07/30/2010   HERPES SIMPLEX INFECTION 10/10/2007   TACHYCARDIA, PAROXYSMAL NOS 10/10/2007   Dyslipidemia 07/11/2007   Essential hypertension 07/11/2007   ALLERGIC RHINITIS 07/11/2007   PCP:  Karna Dupes, MD Pharmacy:   [id number] - [place], [place] - [address] [place] [address] Phone: [phone] Fax: [phone]     Social Determinants of Health (SDOH) Interventions    Readmission Risk Interventions Readmission Risk Prevention Plan 10/22/2021  Transportation Screening Complete  Medication Review (RN Care Manager) Referral to Pharmacy  PCP or Specialist appointment within 3-5 days of discharge Complete  HRI or Home Care Consult Not Complete  HRI or Home Care Consult Pt Refusal Comments from SNF  SW Recovery Care/Counseling Consult Complete  Palliative Care Screening Not Applicable  Skilled Nursing Facility Complete  Some recent data might be hidden

## 2021-11-03 DIAGNOSIS — F01518 Vascular dementia, unspecified severity, with other behavioral disturbance: Secondary | ICD-10-CM | POA: Diagnosis not present

## 2021-11-03 DIAGNOSIS — J189 Pneumonia, unspecified organism: Secondary | ICD-10-CM | POA: Diagnosis not present

## 2021-11-03 DIAGNOSIS — E785 Hyperlipidemia, unspecified: Secondary | ICD-10-CM | POA: Diagnosis not present

## 2021-11-03 DIAGNOSIS — U071 COVID-19: Secondary | ICD-10-CM | POA: Diagnosis not present

## 2021-11-03 LAB — GLUCOSE, CAPILLARY
Glucose-Capillary: 94 mg/dL (ref 70–99)
Glucose-Capillary: 97 mg/dL (ref 70–99)
Glucose-Capillary: 98 mg/dL (ref 70–99)
Glucose-Capillary: 137 mg/dL — ABNORMAL HIGH (ref 70–99)
Glucose-Capillary: 114 mg/dL — ABNORMAL HIGH (ref 70–99)

## 2021-11-03 NOTE — [place] FL2 (Signed)
[place] MEDICAID FL2 LEVEL OF CARE SCREENING TOOL     IDENTIFICATION  Patient Name: [name] [name] Birthdate: 1935-09-07 Sex: male Admission Date (Current Location): 10/20/2021  [facility] and [place] Number:  Producer, television/film/video and Address:  [facility], [address], [place], [place] [address]      Provider Number: [id number]  Attending Physician Name and Address:  Coralie Keens,*  Relative Name and Phone Number:  Diamonte Stavely [phone]    Current Level of Care: Hospital Recommended Level of Care: Skilled Nursing Facility Prior Approval Number:    Date Approved/Denied:   PASRR Number: [id number]  Discharge Plan: SNF    Current Diagnoses: Patient Active Problem List   Diagnosis Date Noted   Pneumonia 10/20/2021   Pressure injury of skin 05/11/2021   Pericardial effusion, acute    COVID    Palliative care by specialist    Goals of care, counseling/discussion    General weakness    Syncope 05/07/2021   Seizure (HCC) 03/09/2021   Dementia (HCC) 02/25/2021   BPH with urinary obstruction 12/22/2020   Ankle edema, bilateral 07/13/2020   Hallucinations 02/09/2020   Abdominal pain, acute 08/21/2014   GI bleeding 05/26/2012   Renal mass 05/26/2012   Diverticulosis of colon with hemorrhage 05/26/2012   SHOULDER PAIN, BILATERAL 07/30/2010   HERPES SIMPLEX INFECTION 10/10/2007   TACHYCARDIA, PAROXYSMAL NOS 10/10/2007   Dyslipidemia 07/11/2007   Essential hypertension 07/11/2007   ALLERGIC RHINITIS 07/11/2007    Orientation RESPIRATION BLADDER Height & Weight     Self  Normal Incontinent Weight: 192 lb 10.9 oz (87.4 kg) Height:  6\' 1"  (185.4 cm)  BEHAVIORAL SYMPTOMS/MOOD NEUROLOGICAL BOWEL NUTRITION STATUS     (n/a) Incontinent    AMBULATORY STATUS COMMUNICATION OF NEEDS Skin   Extensive Assist (max assist) Verbally Normal                       Personal Care Assistance Level of Assistance  Bathing,  Feeding, Dressing Bathing Assistance: Limited assistance Feeding assistance: Limited assistance Dressing Assistance: Limited assistance     Functional Limitations Info  Sight, Hearing, Speech Sight Info: Adequate Hearing Info: Adequate Speech Info: Impaired    SPECIAL CARE FACTORS FREQUENCY  PT (By licensed PT), OT (By licensed OT)     PT Frequency: 5X/wk OT Frequency: 5X/wk            Contractures      Additional Factors Info  Code Status, Allergies, Psychotropic, Insulin Sliding Scale, Isolation Precautions, Suctioning Needs Code Status Info: Full Allergies Info: Chocolate, Ciprofloxacin, Lipitor (Atorvastatin) Psychotropic Info: see d/c summary Insulin Sliding Scale Info: see d/c summary       Current Medications (11/03/2021):  This is the current hospital active medication list Current Facility-Administered Medications  Medication Dose Route Frequency Provider Last Rate Last Admin   0.9 %  sodium chloride infusion   Intravenous PRN 13/08/2021, MD   Stopped at 10/27/21 0120   acetaminophen (TYLENOL) tablet 650 mg  650 mg Oral Q6H PRN 13/02/22, MD   650 mg at 10/29/21 13/04/22   Or   acetaminophen (TYLENOL) suppository 650 mg  650 mg Rectal Q6H PRN 0254, MD       brinzolamide (AZOPT) 1 % ophthalmic suspension 1 drop  1 drop Both Eyes TID Eduard Clos, MD   1 drop at 11/03/21 0921   chlorhexidine (PERIDEX) 0.12 % solution 15 mL  15  mL Mouth Rinse BID Eduard Clos, MD   15 mL at 11/03/21 0920   donepezil (ARICEPT) tablet 10 mg  10 mg Oral QHS Arrien, York Ram, MD   10 mg at 11/02/21 2105   enoxaparin (LOVENOX) injection 40 mg  40 mg Subcutaneous Q24H Eduard Clos, MD   40 mg at 11/03/21 0921   guaiFENesin-dextromethorphan (ROBITUSSIN DM) 100-10 MG/5ML syrup 5 mL  5 mL Oral Q4H PRN Arrien, York Ram, MD   5 mL at 10/31/21 0846   latanoprost (XALATAN) 0.005 % ophthalmic solution 1 drop  1 drop Both Eyes  QHS Eduard Clos, MD   1 drop at 11/02/21 2118   levETIRAcetam (KEPPRA) tablet 500 mg  500 mg Oral BID Dorcas Carrow, MD   500 mg at 11/03/21 0920   MEDLINE mouth rinse  15 mL Mouth Rinse q12n4p Eduard Clos, MD   15 mL at 11/02/21 1520   melatonin tablet 10 mg  10 mg Oral QHS PRN Dorcas Carrow, MD   10 mg at 11/02/21 2105   memantine (NAMENDA) tablet 10 mg  10 mg Oral BID Arrien, York Ram, MD   10 mg at 11/03/21 0920   risperiDONE (RISPERDAL) tablet 0.5 mg  0.5 mg Oral QHS Arrien, York Ram, MD   0.5 mg at 11/02/21 2106   traMADol (ULTRAM) tablet 50 mg  50 mg Oral Q12H PRN Lanae Boast, MD   50 mg at 10/31/21 0846   valproic acid (DEPAKENE) 250 MG/5ML solution 500 mg  500 mg Oral BID Coralie Keens, MD   500 mg at 11/03/21 5638     Discharge Medications: Please see discharge summary for a list of discharge medications.  Relevant Imaging Results:  Relevant Lab Results:   Additional Information SS# [id number]  Catalina Pizza Naba Sneed, LCSWA

## 2021-11-03 NOTE — Progress Notes (Signed)
Speech Language Pathology Treatment: Dysphagia  Patient Details Name: [name] [name] MRN: [id number] DOB: 1935/04/27 Today's Date: 11/03/2021 Time: 3903-0092 SLP Time Calculation (min) (ACUTE ONLY): 17 min  Assessment / Plan / Recommendation Clinical Impression  Pt was seen for dyspahgia treatment. He was alert and cooperative with slightly improved mentation compared to that described on 11/7. Pt tolerated individual sips of nectar thick liquids via cup without overt s/sx of aspiration. He was responsive to cues for rate and bolus size with liquids via cup, but despite cues, he used consecutive swallows with straw which likely increased aspiration risk. Pt demonstrated delayed throat clearing and coughing with nectar thick liquids via straw, suggesting aspiration of penetrated material, as was noted on the MBS. Mastication and oral clearance was [facility] with regular texture solids. Multiple swallows were noted, suggesting possible pharyngeal residue. Pt's diet will be advanced to dysphagia 2 and nectar thick liquids with observance of swallowing precautions. Full supervision will still be needed to ensure consistent observance. Pt currently has discharge orders and continued SLP services are recommended following discharge.    HPI HPI: Pt is an 86 y/o male admitted 10/26 from nursing home with respiratory distress/concern for aspiration. CXR 10/26: Bilateral lower lung airspace disease, left greater than right, concerning for infection. MBS 11/4: dys2/honey, mod dysphagia. PMH: dementia, seizures, BPH, GI bleed, HTN, HLD, hyperglycemia.      SLP Plan  Continue with current plan of care      Recommendations for follow up therapy are one component of a multi-disciplinary discharge planning process, led by the attending physician.  Recommendations may be updated based on patient status, additional functional criteria and insurance authorization.    Recommendations  Diet recommendations: Dysphagia 2  (fine chop);Nectar-thick liquid Liquids provided via: Cup;No straw Medication Administration: Crushed with puree Supervision: Full supervision/cueing for compensatory strategies Compensations: Minimize environmental distractions;Slow rate;Small sips/bites;Effortful swallow;Follow solids with liquid Postural Changes and/or Swallow Maneuvers: Seated upright 90 degrees                Oral Care Recommendations: Oral care BID Follow Up Recommendations: Skilled nursing-short term rehab (<3 hours/day) SLP Visit Diagnosis: Dysphagia, oropharyngeal phase (R13.12) Plan: Continue with current plan of care       [name] I. [name], MS, CCC-SLP Acute Rehabilitation Services Office number [phone] Pager [phone]                 [name]  11/03/2021, 11:49 AM

## 2021-11-03 NOTE — Progress Notes (Addendum)
Patient continue to remain stable for discharge, he is tolerating po well, no nausea or vomiting, no dyspnea or chest pain. He has been on room air.   VS T 98.6, HR 73, BP 137/65, RR 14 and oxygen saturation 98% on room air.  Awake and alert Lungs clear to auscultation Heart S1 and S2 present Abdomen is soft and non tender Trace lower extremity edema Feet on protection boots.  Dx pneumonia in the setting of recent COVID 19 viral infection.   Patient is medically stable for  discharge to long term care facility. Social services consulted for discharge planning.

## 2021-11-03 NOTE — TOC Progression Note (Addendum)
Transition of Care [facility]) - Initial/Assessment Note    Patient Details  Name: [name] [name] MRN: [id number] Date of Birth: [birth year]  [facility]) CM/SW Contact:    Ralene Bathe, LCSWA Phone Number: 11/03/2021, 10:51 AM  Clinical Narrative:                 CSW contacted Malena Peer with [facility] to inquire about the patient admitting.  The facility can accept the patient once the family pays the past due balance from June.  CSW spoke with the patient's wife who reports that she plans to go to [place] today to pay the amount.    Malena Peer at [facility] updated and asked to notify CSW when the patient can be admitted to the facility.    CSW received notification that the patient could receive rehab at the facility and submitted for insurance authorization.  The patient's wife paid the requested amount to the facility and the patient can d/c to facility when insurance Berkley Harvey is received.  If insurance denies rehab, the patient will still admit under private pay. Expected Discharge Plan: Skilled Nursing Facility Barriers to Discharge: Continued Medical Work up   Patient Goals and CMS Choice Patient states their goals for this hospitalization and ongoing recovery are:: unable to discuss covid precautions CMS Medicare.gov Compare Post Acute Care list provided to::  (per wife will return to [place]) Choice offered to / list presented to : NA  Expected Discharge Plan and Services Expected Discharge Plan: Skilled Nursing Facility In-house Referral: NA Discharge Planning Services: CM Consult Post Acute Care Choice: NA Living arrangements for the past 2 months: Skilled Nursing Facility Expected Discharge Date: 11/03/21               DME Arranged: N/A DME Agency: NA       HH Arranged: NA HH Agency: NA        Prior Living Arrangements/Services Living arrangements for the past 2 months: Skilled Nursing Facility Lives with:: Facility Resident Patient language and need  for interpreter reviewed:: Yes        Need for Family Participation in Patient Care: Yes (Comment) Care giver support system in place?: Yes (comment) Current home services:  (n/a) Criminal Activity/Legal Involvement Pertinent to Current Situation/Hospitalization: No - Comment as needed  Activities of Daily Living      Permission Sought/Granted   Permission granted to share information with : No              Emotional Assessment Appearance::  (unable to assess)   Affect (typically observed): Unable to Assess Orientation: :  (unable to assess)   Psych Involvement: No (comment)  Admission diagnosis:  Pneumonia [J18.9] Aspiration pneumonia of both lower lobes, unspecified aspiration pneumonia type (HCC) [J69.0] COVID [U07.1] Dementia, unspecified dementia severity, unspecified dementia type, unspecified whether behavioral, psychotic, or mood disturbance or anxiety (HCC) [F03.90] Patient Active Problem List   Diagnosis Date Noted   Pneumonia 10/20/2021   Pressure injury of skin 05/11/2021   Pericardial effusion, acute    COVID    Palliative care by specialist    Goals of care, counseling/discussion    General weakness    Syncope 05/07/2021   Seizure (HCC) 03/09/2021   Dementia (HCC) 02/25/2021   BPH with urinary obstruction 12/22/2020   Ankle edema, bilateral 07/13/2020   Hallucinations 02/09/2020   Abdominal pain, acute 08/21/2014   GI bleeding 05/26/2012   Renal mass 05/26/2012   Diverticulosis of colon with hemorrhage 05/26/2012   SHOULDER  PAIN, BILATERAL 07/30/2010   HERPES SIMPLEX INFECTION 10/10/2007   TACHYCARDIA, PAROXYSMAL NOS 10/10/2007   Dyslipidemia 07/11/2007   Essential hypertension 07/11/2007   ALLERGIC RHINITIS 07/11/2007   PCP:  Karna Dupes, MD Pharmacy:   [facility] [address] [place], [place] - [address] [place] [address] Phone: [phone] Fax: [phone]     Social  Determinants of Health (SDOH) Interventions    Readmission Risk Interventions Readmission Risk Prevention Plan 10/22/2021  Transportation Screening Complete  Medication Review (RN Care Manager) Referral to Pharmacy  PCP or Specialist appointment within 3-5 days of discharge Complete  HRI or Home Care Consult Not Complete  HRI or Home Care Consult Pt Refusal Comments from SNF  SW Recovery Care/Counseling Consult Complete  Palliative Care Screening Not Applicable  Skilled Nursing Facility Complete  Some recent data might be hidden

## 2021-11-04 DIAGNOSIS — J189 Pneumonia, unspecified organism: Secondary | ICD-10-CM | POA: Diagnosis not present

## 2021-11-04 LAB — GLUCOSE, CAPILLARY
Glucose-Capillary: 101 mg/dL — ABNORMAL HIGH (ref 70–99)
Glucose-Capillary: 102 mg/dL — ABNORMAL HIGH (ref 70–99)
Glucose-Capillary: 105 mg/dL — ABNORMAL HIGH (ref 70–99)
Glucose-Capillary: 77 mg/dL (ref 70–99)
Glucose-Capillary: 79 mg/dL (ref 70–99)
Glucose-Capillary: 83 mg/dL (ref 70–99)
Glucose-Capillary: 88 mg/dL (ref 70–99)
Glucose-Capillary: 90 mg/dL (ref 70–99)

## 2021-11-04 NOTE — Progress Notes (Addendum)
Patient continue medically stable for discharge, yesterday his discharge was delayed due to pending insurance authorization.   He is awake and alert, tolerating po well.   His vital signs continue to be stable with blood pressure 146/81, HR 85, RR 14 and oxygen saturation of 98% on room air.  His physical examination has not changed, he has clear lungs to auscultation, heart S1 and S2 present and rhythmic, abdomen soft and trace lower extremity edema.  Pneumonia in the setting of SARS COVID 19 infection.  Plan to transfer to SNF when insurance authorization cleared.

## 2021-11-04 NOTE — Progress Notes (Signed)
Clarified orders, pt covid status has been dc 11/5 pt no longer on precautions per provider on call

## 2021-11-04 NOTE — Progress Notes (Signed)
Speech Language Pathology Treatment: Dysphagia  Patient Details Name: [name] [name] MRN: [id number] DOB: 06/23/1935 Today's Date: 11/04/2021 Time: 4097-3532 SLP Time Calculation (min) (ACUTE ONLY): 18 min  Assessment / Plan / Recommendation Clinical Impression  Pt was seen for dysphagia treatment. Pt's nurse and nurse tech reported that the pt has been tolerating meals without significant difficulty, citing only once when he was noted to cough during a meal. Pt tolerated regular texture solids, dysphagia 3 solids, and nectar thick liquids via cup without overt s/sx of aspiration, Mastication and oral clearance were WNL. Pt did demonstrate a preference for larger bolus sizes. This did not appear to significantly impact safety with the aforementioned trials, but SLP questions the impact of this with more challenging solids. Pt demonstrated a mildly wet vocal quality and throat clearing after repeated trials of regular texture solids and thin liquids via cup. Pt demonstrated consecutive swallows with thin liquids, but this behavior was not observed with nectar thick liquids. Pt's diet will be advanced to dysphagia 3 solids and nectar thick liquids will be continued with continued full supervision to ensure observance of swallowing precautions. SLP will continue to follow pt.     HPI HPI: Pt is an 86 y/o male admitted 10/26 from nursing home with respiratory distress/concern for aspiration. CXR 10/26: Bilateral lower lung airspace disease, left greater than right, concerning for infection. MBS 11/4: dys2/honey, mod dysphagia. PMH: dementia, seizures, BPH, GI bleed, HTN, HLD, hyperglycemia.      SLP Plan  Continue with current plan of care      Recommendations for follow up therapy are one component of a multi-disciplinary discharge planning process, led by the attending physician.  Recommendations may be updated based on patient status, additional functional criteria and insurance authorization.     Recommendations  Diet recommendations: Dysphagia 3 (mechanical soft);Nectar-thick liquid Liquids provided via: Cup;No straw Medication Administration: Crushed with puree Supervision: Full supervision/cueing for compensatory strategies Compensations: Minimize environmental distractions;Slow rate;Small sips/bites;Effortful swallow;Follow solids with liquid Postural Changes and/or Swallow Maneuvers: Seated upright 90 degrees                Oral Care Recommendations: Oral care BID Follow Up Recommendations: Skilled nursing-short term rehab (<3 hours/day) SLP Visit Diagnosis: Dysphagia, oropharyngeal phase (R13.12) Plan: Continue with current plan of care       [name] [name] Clock, MS, CCC-SLP Acute Rehabilitation Services Office number [phone] Pager [phone]                 [name] [name]  11/04/2021, 1:42 PM

## 2021-11-04 NOTE — TOC Progression Note (Addendum)
Transition of Care [facility]) - Initial/Assessment Note    Patient Details  Name: DEMARIE HYNEMAN MRN: [id number] Date of Birth: April 12, 1935  Transition [facility] Care [facility]) CM/SW Contact:    Ralene Bathe, LCSWA Phone Number: 11/04/2021, 1:11 PM  Clinical Narrative:                 CSW contacted [employer] (insurance) and was informed that insurance Berkley Harvey was still pending.    CSW informed Malena Peer with [facility] of the above information  15:08-  The insurance company requested a peer to peer by 09:00 tomorrow morning.    Attending notified.    TOC Barrier to d/c- pending insurance auth.    Patient Goals and CMS Choice Patient states their goals for this hospitalization and ongoing recovery are:: unable to discuss covid precautions CMS Medicare.gov Compare Post Acute Care list provided to::  (per wife will return to [place]) Choice offered to / list presented to : NA  Expected Discharge Plan and Services Expected Discharge Plan: Skilled Nursing Facility In-house Referral: NA Discharge Planning Services: CM Consult Post Acute Care Choice: NA Living arrangements for the past 2 months: Skilled Nursing Facility Expected Discharge Date: 11/04/21               DME Arranged: N/A DME Agency: NA       HH Arranged: NA HH Agency: NA        Prior Living Arrangements/Services Living arrangements for the past 2 months: Skilled Nursing Facility Lives with:: Facility Resident Patient language and need for interpreter reviewed:: Yes        Need for Family Participation in Patient Care: Yes (Comment) Care giver support system in place?: Yes (comment) Current home services:  (n/a) Criminal Activity/Legal Involvement Pertinent to Current Situation/Hospitalization: No - Comment as needed  Activities of Daily Living      Permission Sought/Granted   Permission granted to share information with : No              Emotional Assessment Appearance::  (unable to assess)   Affect  (typically observed): Unable to Assess Orientation: :  (unable to assess)   Psych Involvement: No (comment)  Admission diagnosis:  Pneumonia [J18.9] Aspiration pneumonia of both lower lobes, unspecified aspiration pneumonia type (HCC) [J69.0] COVID [U07.1] Dementia, unspecified dementia severity, unspecified dementia type, unspecified whether behavioral, psychotic, or mood disturbance or anxiety (HCC) [F03.90] Patient Active Problem List   Diagnosis Date Noted   Pneumonia 10/20/2021   Pressure injury of skin 05/11/2021   Pericardial effusion, acute    COVID    Palliative care by specialist    Goals of care, counseling/discussion    General weakness    Syncope 05/07/2021   Seizure (HCC) 03/09/2021   Dementia (HCC) 02/25/2021   BPH with urinary obstruction 12/22/2020   Ankle edema, bilateral 07/13/2020   Hallucinations 02/09/2020   Abdominal pain, acute 08/21/2014   GI bleeding 05/26/2012   Renal mass 05/26/2012   Diverticulosis of colon with hemorrhage 05/26/2012   SHOULDER PAIN, BILATERAL 07/30/2010   HERPES SIMPLEX INFECTION 10/10/2007   TACHYCARDIA, PAROXYSMAL NOS 10/10/2007   Dyslipidemia 07/11/2007   Essential hypertension 07/11/2007   ALLERGIC RHINITIS 07/11/2007   PCP:  Karna Dupes, MD Pharmacy:   [facility] [address] - [place], [place] - [address] [place] [address] Phone: [phone] Fax: [phone]     Social Determinants of Health (SDOH) Interventions    Readmission Risk Interventions Readmission Risk Prevention  Plan 10/22/2021  Transportation Screening Complete  Medication Review [employer]) Referral to Pharmacy  PCP or Specialist appointment within 3-5 days of discharge Complete  HRI or Home Care Consult Not Complete  HRI or Home Care Consult Pt Refusal Comments from SNF  SW Recovery Care/Counseling Consult Complete  Palliative Care Screening Not Applicable  Skilled Nursing Facility  Complete  Some recent data might be hidden

## 2021-11-04 NOTE — Progress Notes (Signed)
Physical Therapy Treatment Patient Details Name: [name] [name] MRN: [id number] DOB: 1935/09/29 Today's Date: 11/04/2021   History of Present Illness 86 y/o male admitted 10/26 in respiratory distress with rhonchi and coughing, concern for aspiration. covid + on admission, no longer on precautions as of 11/9.  PMHx:  dementia, glaucoma, HTN.    PT Comments    Pt sleeping upon PT arrival to room, wakes easily and agreeable to therapy. Pt requiring max +1 assist for bed mobility, but tolerated EOB sitting x10 minutes with close guard assist only once balance achieved. Pt with UE and LE tightness and weakness, benefitted form EOB exercises. Pt complaining of LE pain, suspect due to heel cord and hamstring short length. PT to continue to progress pt mobility as able.    Recommendations for follow up therapy are one component of a multi-disciplinary discharge planning process, led by the attending physician.  Recommendations may be updated based on patient status, additional functional criteria and insurance authorization.  Follow Up Recommendations  Skilled nursing-short term rehab (<3 hours/day)     Assistance Recommended at Discharge Frequent or constant Supervision/Assistance  Equipment Recommendations  None recommended by PT    Recommendations for Other Services       Precautions / Restrictions Precautions Precautions: Fall Precaution Comments: catheter, prevalon boots donned in bed for heel wounds     Mobility  Bed Mobility Overal bed mobility: Needs Assistance Bed Mobility: Supine to Sit;Sit to Supine     Supine to sit: Max assist;HOB elevated Sit to supine: Max assist;HOB elevated   General bed mobility comments: max assist for trunk and LE translation, boost up in bed with use of trendelenburg function and bed pads.    Transfers                   General transfer comment: unable to perform today    Ambulation/Gait                   Stairs              Wheelchair Mobility    Modified Rankin (Stroke Patients Only)       Balance Overall balance assessment: Needs assistance Sitting-balance support: Feet supported;Bilateral upper extremity supported Sitting balance-Leahy Scale: Fair Sitting balance - Comments: initially poor requiring posterior truncal assist, transitioning to close guard with pt using bilat UEs to hold self up. EOB sitting x10 min                                    Cognition Arousal/Alertness: Awake/alert Behavior During Therapy: Flat affect Overall Cognitive Status: History of cognitive impairments - at baseline                                 General Comments: history of dementia, follows most one-step commands and is pleasant throughout session. Pt oriented to self only. thinks PT has a dog with her in pt room        Exercises General Exercises - Lower Extremity Long Arc Quad: AAROM;Both;10 reps;Seated Hip Flexion/Marching: AAROM;Both;10 reps;Seated Shoulder Exercises Shoulder Flexion: AAROM;Both;10 reps;Seated (shoulder elevation to 60 degrees, limited by pain)    General Comments        Pertinent Vitals/Pain Pain Assessment: Faces Faces Pain Scale: Hurts little more Pain Location: LEs during mobility Pain Descriptors / Indicators: Grimacing;Moaning Pain  Intervention(s): Limited activity within patient's tolerance;Monitored during session;Repositioned    Home Living                          Prior Function            PT Goals (current goals can now be found in the care plan section) Acute Rehab PT Goals Patient Stated Goal: none stated PT Goal Formulation: With patient Time For Goal Achievement: 11/06/21 Potential to Achieve Goals: Fair Progress towards PT goals: Progressing toward goals    Frequency    Min 2X/week      PT Plan Current plan remains appropriate    Co-evaluation              AM-PAC PT "6 Clicks" Mobility    Outcome Measure  Help needed turning from your back to your side while in a flat bed without using bedrails?: A Lot Help needed moving from lying on your back to sitting on the side of a flat bed without using bedrails?: A Lot Help needed moving to and from a bed to a chair (including a wheelchair)?: Total Help needed standing up from a chair using your arms (e.g., wheelchair or bedside chair)?: Total Help needed to walk in hospital room?: Total Help needed climbing 3-5 steps with a railing? : Total 6 Click Score: 8    End of Session   Activity Tolerance: Patient limited by pain Patient left: with call bell/phone within reach;in bed;with bed alarm set;Other (comment) (prevalon boots) Nurse Communication: Mobility status PT Visit Diagnosis: Unsteadiness on feet (R26.81);Muscle weakness (generalized) (M62.81);Pain Pain - Right/Left: Left Pain - part of body: Hip;Knee     Time: 0867-6195 PT Time Calculation (min) (ACUTE ONLY): 23 min  Charges:  $Therapeutic Exercise: 8-22 mins                    Marye Round, PT DPT Acute Rehabilitation Services Pager [phone]  Office [phone]    [name] 11/04/2021, 4:29 PM

## 2021-11-05 LAB — GLUCOSE, CAPILLARY
Glucose-Capillary: 76 mg/dL (ref 70–99)
Glucose-Capillary: 78 mg/dL (ref 70–99)
Glucose-Capillary: 75 mg/dL (ref 70–99)

## 2021-11-05 NOTE — Progress Notes (Signed)
[facility] [phone] [facility]) Hospital Liaison note:  Notified by [facility] of request for [facility] Palliative Care services. Will continue to follow for disposition.  Please call with any outpatient palliative questions or concerns.  Thank you for the opportunity to participate in this patient's care.  Thank you, Abran Cantor, LPN [facility] Liaison [phone]

## 2021-11-05 NOTE — TOC Transition Note (Signed)
Transition of Care [facility]) - CM/SW Discharge Note   Patient Details  Name: [name] [name] MRN: [id number] Date of Birth: [birth year]  Transition of Care [facility]) CM/SW Contact:  Ralene Bathe, LCSWA Phone Number: 11/05/2021, 12:25 PM   Clinical Narrative:    Patient will DC to:  [facility] Anticipated DC date: 11/05/2021 Family notified:  YES Transport by:  Sharin Mons   Per MD patient ready for DC to SNF. RN to call report prior to discharge [phone] room 312A. RN, patient, patient's family, and facility notified of DC. Discharge Summary and FL2 sent to facility. DC packet on chart. Ambulance transport requested for patient.   CSW will sign off for now as social work intervention is no longer needed. Please consult [place] again if new needs arise.       Barriers to Discharge: Continued Medical Work up   Patient Goals and CMS Choice Patient states their goals for this hospitalization and ongoing recovery are:: unable to discuss covid precautions CMS Medicare.gov Compare Post Acute Care list provided to::  (per wife will return to [facility]) Choice offered to / list presented to : NA  Discharge Placement                       Discharge Plan and Services In-house Referral: NA Discharge Planning Services: CM Consult Post Acute Care Choice: NA          DME Arranged: N/A DME Agency: NA       HH Arranged: NA HH Agency: NA        Social Determinants of Health (SDOH) Interventions     Readmission Risk Interventions Readmission Risk Prevention Plan 10/22/2021  Transportation Screening Complete  Medication Review [facility]) Referral to Pharmacy  PCP or Specialist appointment within 3-5 days of discharge Complete  HRI or Home Care Consult Not Complete  HRI or Home Care Consult Pt Refusal Comments from SNF  SW Recovery Care/Counseling Consult Complete  Palliative Care Screening Not Applicable  Skilled Nursing Facility Complete  Some recent data might be  hidden

## 2021-11-05 NOTE — Care Management (Signed)
11-05-21 1315 Case Manager received a consult for outpatient palliative services. Case Manager spoke with the spouse of the patient and she is agreeable to outpatient palliative services vis [facility]. Patient will transition to [place] today via PTAR. No further needs from Case Manager.

## 2021-11-05 NOTE — Progress Notes (Signed)
Patient is feeling better, denies any chest pain or dyspnea, he has been tolerating po well.   His physical examination is unchanged, he is more awake and alert, responding to simple questions.  Pneumonia sp COVID infection.  Plan to transfer to SNF today.

## 2021-11-05 NOTE — TOC Progression Note (Signed)
Transition of Care [facility]) - Initial/Assessment Note    Patient Details  Name: [name] [name] MRN: [id number] Date of Birth: 10/25/1935  Transition of Care [facility]) CM/SW Contact:    Ralene Bathe, LCSWA Phone Number: 11/05/2021, 10:45 AM  Clinical Narrative:                 CSW received information that the attending will be discharging the patient today.  Insurance denied skilled nursing, but approved long term placement.  CSW attempted to call Malena Peer with [facility] to notify the facility.  There was no answer.  CSW left a message requesting a returned call to receive transfer information for the patient.     Expected Discharge Plan: Skilled Nursing Facility Barriers to Discharge: Continued Medical Work up   Patient Goals and CMS Choice Patient states their goals for this hospitalization and ongoing recovery are:: unable to discuss covid precautions CMS Medicare.gov Compare Post Acute Care list provided to::  (per wife will return to [place]) Choice offered to / list presented to : NA  Expected Discharge Plan and Services Expected Discharge Plan: Skilled Nursing Facility In-house Referral: NA Discharge Planning Services: CM Consult Post Acute Care Choice: NA Living arrangements for the past 2 months: Skilled Nursing Facility Expected Discharge Date: 11/05/21               DME Arranged: N/A DME Agency: NA       HH Arranged: NA HH Agency: NA        Prior Living Arrangements/Services Living arrangements for the past 2 months: Skilled Nursing Facility Lives with:: Facility Resident Patient language and need for interpreter reviewed:: Yes        Need for Family Participation in Patient Care: Yes (Comment) Care giver support system in place?: Yes (comment) Current home services:  (n/a) Criminal Activity/Legal Involvement Pertinent to Current Situation/Hospitalization: No - Comment as needed  Activities of Daily Living   ADL Screening (condition at time of  admission) Patient's cognitive ability adequate to safely complete daily activities?: No Is the patient deaf or have difficulty hearing?: No Does the patient have difficulty seeing, even when wearing glasses/contacts?: No Does the patient have difficulty concentrating, remembering, or making decisions?: Yes Patient able to express need for assistance with ADLs?: No Does the patient have difficulty dressing or bathing?: Yes Independently performs ADLs?: No Communication: Independent Dressing (OT): Needs assistance, Dependent Is this a change from baseline?: Pre-admission baseline Grooming: Needs assistance, Dependent Is this a change from baseline?: Pre-admission baseline Does the patient have difficulty walking or climbing stairs?: Yes Weakness of Legs: Both Weakness of Arms/Hands: Both  Permission Sought/Granted   Permission granted to share information with : No              Emotional Assessment Appearance::  (unable to assess)   Affect (typically observed): Unable to Assess Orientation: :  (unable to assess)   Psych Involvement: No (comment)  Admission diagnosis:  Pneumonia [J18.9] Aspiration pneumonia of both lower lobes, unspecified aspiration pneumonia type (HCC) [J69.0] COVID [U07.1] Dementia, unspecified dementia severity, unspecified dementia type, unspecified whether behavioral, psychotic, or mood disturbance or anxiety (HCC) [F03.90] Patient Active Problem List   Diagnosis Date Noted   Pneumonia 10/20/2021   Pressure injury of skin 05/11/2021   Pericardial effusion, acute    COVID    Palliative care by specialist    Goals of care, counseling/discussion    General weakness    Syncope 05/07/2021   Seizure (HCC) 03/09/2021  Dementia (HCC) 02/25/2021   BPH with urinary obstruction 12/22/2020   Ankle edema, bilateral 07/13/2020   Hallucinations 02/09/2020   Abdominal pain, acute 08/21/2014   GI bleeding 05/26/2012   Renal mass 05/26/2012   Diverticulosis  of colon with hemorrhage 05/26/2012   SHOULDER PAIN, BILATERAL 07/30/2010   HERPES SIMPLEX INFECTION 10/10/2007   TACHYCARDIA, PAROXYSMAL NOS 10/10/2007   Dyslipidemia 07/11/2007   Essential hypertension 07/11/2007   ALLERGIC RHINITIS 07/11/2007   PCP:  Karna Dupes, MD Pharmacy:   [id number] - [place], [place] - [address] [place] [address] Phone: [phone] Fax: [phone]     Social Determinants of Health (SDOH) Interventions    Readmission Risk Interventions Readmission Risk Prevention Plan 10/22/2021  Transportation Screening Complete  Medication Review (RN Care Manager) Referral to Pharmacy  PCP or Specialist appointment within 3-5 days of discharge Complete  HRI or Home Care Consult Not Complete  HRI or Home Care Consult Pt Refusal Comments from SNF  SW Recovery Care/Counseling Consult Complete  Palliative Care Screening Not Applicable  Skilled Nursing Facility Complete  Some recent data might be hidden

## 2021-11-05 NOTE — Progress Notes (Signed)
PTAR arrived to the unit, pt d/c to SNF

## 2021-11-05 NOTE — Progress Notes (Signed)
Admission not completed prior to taking on care of pt, admission questions answered to best of nurse ability, pt has dementia from facility not able to answer all admission questions at this time

## 2021-11-05 NOTE — Progress Notes (Signed)
Provider made aware about stool count x5, not liquid but causign some breakdown to bottom, barrier cream applied

## 2021-11-18 ENCOUNTER — Emergency Department (HOSPITAL_COMMUNITY): Payer: Medicare Other

## 2021-11-18 ENCOUNTER — Encounter (HOSPITAL_COMMUNITY): Payer: Self-pay | Admitting: Emergency Medicine

## 2021-11-18 ENCOUNTER — Inpatient Hospital Stay (HOSPITAL_COMMUNITY)
Admission: EM | Admit: 2021-11-18 | Discharge: 2021-12-05 | DRG: 177 | Disposition: A | Discharge status: Skilled Nursing Facility | Payer: Medicare Other | Source: Skilled Nursing Facility | Attending: Internal Medicine | Admitting: Internal Medicine

## 2021-11-18 ENCOUNTER — Other Ambulatory Visit: Payer: Self-pay

## 2021-11-18 DIAGNOSIS — N400 Enlarged prostate without lower urinary tract symptoms: Secondary | ICD-10-CM | POA: Diagnosis present

## 2021-11-18 DIAGNOSIS — R4182 Altered mental status, unspecified: Secondary | ICD-10-CM | POA: Diagnosis not present

## 2021-11-18 DIAGNOSIS — R1313 Dysphagia, pharyngeal phase: Secondary | ICD-10-CM | POA: Diagnosis present

## 2021-11-18 DIAGNOSIS — Z8701 Personal history of pneumonia (recurrent): Secondary | ICD-10-CM

## 2021-11-18 DIAGNOSIS — R52 Pain, unspecified: Secondary | ICD-10-CM

## 2021-11-18 DIAGNOSIS — J189 Pneumonia, unspecified organism: Secondary | ICD-10-CM | POA: Diagnosis not present

## 2021-11-18 DIAGNOSIS — Z8249 Family history of ischemic heart disease and other diseases of the circulatory system: Secondary | ICD-10-CM

## 2021-11-18 DIAGNOSIS — Z87891 Personal history of nicotine dependence: Secondary | ICD-10-CM

## 2021-11-18 DIAGNOSIS — L89616 Pressure-induced deep tissue damage of right heel: Secondary | ICD-10-CM | POA: Diagnosis present

## 2021-11-18 DIAGNOSIS — J90 Pleural effusion, not elsewhere classified: Secondary | ICD-10-CM | POA: Diagnosis present

## 2021-11-18 DIAGNOSIS — K589 Irritable bowel syndrome without diarrhea: Secondary | ICD-10-CM | POA: Diagnosis present

## 2021-11-18 DIAGNOSIS — L89152 Pressure ulcer of sacral region, stage 2: Secondary | ICD-10-CM | POA: Diagnosis present

## 2021-11-18 DIAGNOSIS — L8990 Pressure ulcer of unspecified site, unspecified stage: Secondary | ICD-10-CM | POA: Diagnosis present

## 2021-11-18 DIAGNOSIS — Z9181 History of falling: Secondary | ICD-10-CM

## 2021-11-18 DIAGNOSIS — I3139 Other pericardial effusion (noninflammatory): Secondary | ICD-10-CM | POA: Diagnosis present

## 2021-11-18 DIAGNOSIS — I10 Essential (primary) hypertension: Secondary | ICD-10-CM | POA: Diagnosis present

## 2021-11-18 DIAGNOSIS — Z4659 Encounter for fitting and adjustment of other gastrointestinal appliance and device: Secondary | ICD-10-CM

## 2021-11-18 DIAGNOSIS — M25552 Pain in left hip: Secondary | ICD-10-CM | POA: Diagnosis present

## 2021-11-18 DIAGNOSIS — E162 Hypoglycemia, unspecified: Secondary | ICD-10-CM | POA: Diagnosis not present

## 2021-11-18 DIAGNOSIS — E785 Hyperlipidemia, unspecified: Secondary | ICD-10-CM | POA: Diagnosis present

## 2021-11-18 DIAGNOSIS — Z881 Allergy status to other antibiotic agents status: Secondary | ICD-10-CM

## 2021-11-18 DIAGNOSIS — Z91018 Allergy to other foods: Secondary | ICD-10-CM

## 2021-11-18 DIAGNOSIS — Z888 Allergy status to other drugs, medicaments and biological substances status: Secondary | ICD-10-CM

## 2021-11-18 DIAGNOSIS — G40909 Epilepsy, unspecified, not intractable, without status epilepticus: Secondary | ICD-10-CM | POA: Diagnosis present

## 2021-11-18 DIAGNOSIS — L89626 Pressure-induced deep tissue damage of left heel: Secondary | ICD-10-CM | POA: Diagnosis present

## 2021-11-18 DIAGNOSIS — R600 Localized edema: Secondary | ICD-10-CM | POA: Diagnosis present

## 2021-11-18 DIAGNOSIS — R627 Adult failure to thrive: Secondary | ICD-10-CM | POA: Diagnosis present

## 2021-11-18 DIAGNOSIS — H409 Unspecified glaucoma: Secondary | ICD-10-CM | POA: Diagnosis present

## 2021-11-18 DIAGNOSIS — Z7401 Bed confinement status: Secondary | ICD-10-CM

## 2021-11-18 DIAGNOSIS — D649 Anemia, unspecified: Secondary | ICD-10-CM | POA: Diagnosis present

## 2021-11-18 DIAGNOSIS — G9340 Encephalopathy, unspecified: Secondary | ICD-10-CM | POA: Diagnosis present

## 2021-11-18 DIAGNOSIS — F0390 Unspecified dementia without behavioral disturbance: Secondary | ICD-10-CM | POA: Diagnosis present

## 2021-11-18 DIAGNOSIS — Z8616 Personal history of COVID-19: Secondary | ICD-10-CM

## 2021-11-18 DIAGNOSIS — J690 Pneumonitis due to inhalation of food and vomit: Secondary | ICD-10-CM | POA: Diagnosis not present

## 2021-11-18 DIAGNOSIS — R569 Unspecified convulsions: Secondary | ICD-10-CM

## 2021-11-18 DIAGNOSIS — Z20822 Contact with and (suspected) exposure to covid-19: Secondary | ICD-10-CM | POA: Diagnosis present

## 2021-11-18 DIAGNOSIS — G9341 Metabolic encephalopathy: Secondary | ICD-10-CM | POA: Diagnosis present

## 2021-11-18 DIAGNOSIS — Z79899 Other long term (current) drug therapy: Secondary | ICD-10-CM

## 2021-11-18 LAB — COMPREHENSIVE METABOLIC PANEL
ALT: 17 U/L (ref 0–44)
AST: 25 U/L (ref 15–41)
Albumin: 2.7 g/dL — ABNORMAL LOW (ref 3.5–5.0)
Alkaline Phosphatase: 59 U/L (ref 38–126)
Anion gap: 9 (ref 5–15)
BUN: 17 mg/dL (ref 8–23)
CO2: 29 mmol/L (ref 22–32)
Calcium: 8.9 mg/dL (ref 8.9–10.3)
Chloride: 103 mmol/L (ref 98–111)
Creatinine, Ser: 0.72 mg/dL (ref 0.61–1.24)
GFR, Estimated: >60 mL/min (ref >60)
Glucose, Bld: 114 mg/dL — ABNORMAL HIGH (ref 70–99)
Potassium: 4.3 mmol/L (ref 3.5–5.1)
Sodium: 141 mmol/L (ref 135–145)
Total Bilirubin: 0.2 mg/dL — ABNORMAL LOW (ref 0.3–1.2)
Total Protein: 6.1 g/dL — ABNORMAL LOW (ref 6.5–8.1)

## 2021-11-18 LAB — URINALYSIS, ROUTINE W REFLEX MICROSCOPIC
Bilirubin Urine: NEGATIVE
Glucose, UA: NEGATIVE mg/dL
Hgb urine dipstick: NEGATIVE
Ketones, ur: NEGATIVE mg/dL
Nitrite: NEGATIVE
Protein, ur: NEGATIVE mg/dL
Specific Gravity, Urine: 1.026 (ref 1.005–1.030)
pH: 6 (ref 5.0–8.0)

## 2021-11-18 LAB — CBC WITH DIFFERENTIAL/PLATELET
Abs Immature Granulocytes: 0.04 10*3/uL (ref 0.00–0.07)
Basophils Absolute: 0 10*3/uL (ref 0.0–0.1)
Basophils Relative: 0 %
Eosinophils Absolute: 0.1 10*3/uL (ref 0.0–0.5)
Eosinophils Relative: 1 %
HCT: 40.5 % (ref 39.0–52.0)
Hemoglobin: 12.4 g/dL — ABNORMAL LOW (ref 13.0–17.0)
Immature Granulocytes: 0 %
Lymphocytes Relative: 9 %
Lymphs Abs: 0.8 10*3/uL (ref 0.7–4.0)
MCH: 26.8 pg (ref 26.0–34.0)
MCHC: 30.6 g/dL (ref 30.0–36.0)
MCV: 87.7 fL (ref 80.0–100.0)
Monocytes Absolute: 1.1 10*3/uL — ABNORMAL HIGH (ref 0.1–1.0)
Monocytes Relative: 12 %
Neutro Abs: 7.4 10*3/uL (ref 1.7–7.7)
Neutrophils Relative %: 78 %
Platelets: 299 10*3/uL (ref 150–400)
RBC: 4.62 MIL/uL (ref 4.22–5.81)
RDW: 14.9 % (ref 11.5–15.5)
WBC: 9.5 10*3/uL (ref 4.0–10.5)
nRBC: 0 % (ref 0.0–0.2)

## 2021-11-18 LAB — RESP PANEL BY RT-PCR (FLU A&B, COVID) ARPGX2
Influenza A by PCR: NEGATIVE
Influenza B by PCR: NEGATIVE
SARS Coronavirus 2 by RT PCR: NEGATIVE

## 2021-11-18 LAB — PROTIME-INR
INR: 1 (ref 0.8–1.2)
Prothrombin Time: 13.6 seconds (ref 11.4–15.2)

## 2021-11-18 LAB — CBG MONITORING, ED: Glucose-Capillary: 130 mg/dL — ABNORMAL HIGH (ref 70–99)

## 2021-11-18 LAB — APTT: aPTT: 33 seconds (ref 24–36)

## 2021-11-18 LAB — LACTIC ACID, PLASMA: Lactic Acid, Venous: 1.7 mmol/L (ref 0.5–1.9)

## 2021-11-18 MED ORDER — OMEGA-3 FATTY ACIDS 1000 MG PO CAPS: 1.0000 g | ORAL_CAPSULE | Freq: Every day | ORAL | Status: DC

## 2021-11-18 MED ORDER — SODIUM CHLORIDE 0.9 % IV SOLN
500.0000 mg | INTRAVENOUS | Status: DC
  Administered 2021-11-19: 500 mg via INTRAVENOUS
  Filled 2021-11-18: qty 500

## 2021-11-18 MED ORDER — ASCORBIC ACID 500 MG PO TABS
250.0000 mg | ORAL_TABLET | Freq: Every day | ORAL | Status: DC
  Filled 2021-11-18: qty 1

## 2021-11-18 MED ORDER — ORAL CARE MOUTH RINSE
15.0000 mL | Freq: Two times a day (BID) | OROMUCOSAL | Status: DC
  Administered 2021-11-18 – 2021-12-05 (×33): 15 mL via OROMUCOSAL

## 2021-11-18 MED ORDER — LEVETIRACETAM IN NACL 500 MG/100ML IV SOLN
500.0000 mg | Freq: Two times a day (BID) | INTRAVENOUS | Status: DC
  Administered 2021-11-19 – 2021-11-21 (×6): 500 mg via INTRAVENOUS
  Filled 2021-11-18 (×6): qty 100

## 2021-11-18 MED ORDER — ACETAMINOPHEN 325 MG PO TABS: 650.0000 mg | ORAL_TABLET | Freq: Four times a day (QID) | ORAL | Status: DC | PRN

## 2021-11-18 MED ORDER — ENOXAPARIN SODIUM 40 MG/0.4ML IJ SOSY
40.0000 mg | PREFILLED_SYRINGE | INTRAMUSCULAR | Status: DC
  Administered 2021-11-19 – 2021-12-05 (×16): 40 mg via SUBCUTANEOUS
  Filled 2021-11-18 (×16): qty 0.4

## 2021-11-18 MED ORDER — MEMANTINE HCL 10 MG PO TABS
10.0000 mg | ORAL_TABLET | Freq: Every day | ORAL | Status: DC
  Filled 2021-11-18: qty 1

## 2021-11-18 MED ORDER — ADULT MULTIVITAMIN W/MINERALS CH
1.0000 | ORAL_TABLET | Freq: Every day | ORAL | Status: DC
  Filled 2021-11-18: qty 1

## 2021-11-18 MED ORDER — SODIUM CHLORIDE 0.9 % IV SOLN
2.0000 g | INTRAVENOUS | Status: DC
  Administered 2021-11-19: 2 g via INTRAVENOUS
  Filled 2021-11-18: qty 20

## 2021-11-18 MED ORDER — ACETAMINOPHEN 650 MG RE SUPP: 650.0000 mg | Freq: Four times a day (QID) | RECTAL | Status: DC | PRN

## 2021-11-18 MED ORDER — SODIUM CHLORIDE 0.9 % IV SOLN
2.0000 g | Freq: Once | INTRAVENOUS | Status: DC
  Filled 2021-11-18: qty 2

## 2021-11-18 MED ORDER — LATANOPROST 0.005 % OP SOLN
1.0000 [drp] | Freq: Every day | OPHTHALMIC | Status: DC
  Administered 2021-11-19 – 2021-12-04 (×17): 1 [drp] via OPHTHALMIC
  Filled 2021-11-18: qty 2.5

## 2021-11-18 MED ORDER — SODIUM CHLORIDE 0.9 % IV SOLN: INTRAVENOUS | Status: AC

## 2021-11-18 MED ORDER — SODIUM CHLORIDE 0.9 % IV SOLN: 1.0000 g | Freq: Once | INTRAVENOUS | Status: DC

## 2021-11-18 MED ORDER — DONEPEZIL HCL 10 MG PO TABS
10.0000 mg | ORAL_TABLET | Freq: Every day | ORAL | Status: DC
  Filled 2021-11-18: qty 1

## 2021-11-18 MED ORDER — DIVALPROEX SODIUM 250 MG PO DR TAB
500.0000 mg | DELAYED_RELEASE_TABLET | Freq: Every day | ORAL | Status: DC
  Filled 2021-11-18: qty 2

## 2021-11-18 MED ORDER — NETARSUDIL DIMESYLATE 0.02 % OP SOLN
1.0000 [drp] | Freq: Every day | OPHTHALMIC | Status: DC
Start: 2021-11-19 — End: 2021-12-05

## 2021-11-18 MED ORDER — RISPERIDONE 0.5 MG PO TABS
0.5000 mg | ORAL_TABLET | Freq: Every day | ORAL | Status: DC
  Filled 2021-11-18: qty 1

## 2021-11-18 MED ORDER — TAMSULOSIN HCL 0.4 MG PO CAPS
0.4000 mg | ORAL_CAPSULE | ORAL | Status: DC
  Filled 2021-11-18 (×3): qty 1

## 2021-11-18 MED ORDER — VANCOMYCIN HCL 1500 MG/300ML IV SOLN
1500.0000 mg | Freq: Once | INTRAVENOUS | Status: DC
  Filled 2021-11-18 (×2): qty 300

## 2021-11-18 MED ORDER — BRINZOLAMIDE 1 % OP SUSP
1.0000 [drp] | Freq: Three times a day (TID) | OPHTHALMIC | Status: DC
  Administered 2021-11-19 – 2021-12-05 (×49): 1 [drp] via OPHTHALMIC
  Filled 2021-11-18: qty 10

## 2021-11-18 NOTE — ED Provider Notes (Signed)
[facility] DEPT Provider Note   CSN: [id number] Arrival date & time: 11/18/21  [id number]     History No chief complaint on file.   [name] [name] is a 86 y.o. male.  HPI  Patient is an 86 year old male with a history of hyperglycemia, hyperlipidemia, hypertension, dementia, who presents to the emergency department via EMS from [facility] due to altered mental status that began this morning.  Patient recently admitted on October 26 and discharged on November 11.  Patient found to have bilateral lower lobe infiltrates and was treated with antibiotics as well as remdesivir.  Of note, patient has a history of advanced dementia, cognitive impairment, and is on memantine and risperidone.  Patient discussed with the nursing staff at his facility.  She states this morning at 7 AM they noticed that he had increasing rhonchi and required suction as well as a DuoNeb.  His oxygen saturations improved from 93% on room air to 95%-96%.  Denies any fevers and states that patient is still tolerating p.o. but notes that he has been much more lethargic than normal.  He is not following commands.  He is A&O x1 at baseline but states that he is typically talkative and will even sing to the nurses and does not appear to be himself today.  Level 5 caveat due to dementia as well as acuity of condition     Past Medical History:  Diagnosis Date   Allergy    Dementia [facility]    sees Dr. Ellouise Newer    Diverticulosis of colon (without mention of hemorrhage) 03-01-2004, 04-04-2011   Colonoscopy   ED (erectile dysfunction)    Glaucoma    sees Dr. Crecencio Mc    Hyperglycemia    Hyperlipidemia    Hypertension    Internal hemorrhoids 03-15-1999   Flex Sig    Irritable bowel     Patient Active Problem List   Diagnosis Date Noted   Pneumonia 10/20/2021   Pressure injury of skin 05/11/2021   Pericardial effusion, acute    COVID    Palliative care by specialist     Goals of care, counseling/discussion    General weakness    Syncope 05/07/2021   Seizure ([place]) 03/09/2021   Dementia ([place]) 02/25/2021   BPH with urinary obstruction 12/22/2020   Ankle edema, bilateral 07/13/2020   Hallucinations 02/09/2020   Abdominal pain, acute 08/21/2014   GI bleeding 05/26/2012   Renal mass 05/26/2012   Diverticulosis of colon with hemorrhage 05/26/2012   SHOULDER PAIN, BILATERAL 07/30/2010   HERPES SIMPLEX INFECTION 10/10/2007   TACHYCARDIA, PAROXYSMAL NOS 10/10/2007   Dyslipidemia 07/11/2007   Essential hypertension 07/11/2007   ALLERGIC RHINITIS 07/11/2007    Past Surgical History:  Procedure Laterality Date   COLONOSCOPY  04-04-11   per Dr. Sharlett Iles, clear, no repeats needed   EYE SURGERY     bilateral cataract extraction per Dr Dolores Lory   INGUINAL HERNIA REPAIR     KNEE ARTHROSCOPY     right knee   NASAL SINUS SURGERY         Family History  Problem Relation Age of Onset   Hypertension Mother    Lung cancer Father    Colitis Daughter     Social History   Tobacco Use   Smoking status: Former    Types: Cigarettes    Quit date: 03/20/1969    Years since quitting: 52.7   Smokeless tobacco: Never  Vaping Use   Vaping Use: Never  used  Substance Use Topics   Alcohol use: Yes    Alcohol/week: 0.0 standard drinks    Comment: occ   Drug use: No    Home Medications Prior to Admission medications   Medication Sig Start Date End Date Taking? Authorizing Provider  acetaminophen (TYLENOL) 500 MG tablet Take 1,000 mg by mouth 3 (three) times daily.    [provider]  AZOPT 1 % ophthalmic suspension Place 1 drop into both eyes 3 (three) times daily. 12/18/19   [provider]  Coenzyme Q10 (CO Q 10 PO) Take 30 mg by mouth daily.    [provider]  divalproex (DEPAKOTE ER) 500 MG 24 hr tablet Take 2 tablets (1,000 mg total) by mouth daily. Take 1 tablet every night Patient taking differently: Take 1,000 mg by mouth at  bedtime. 05/12/21   [name], MD  donepezil (ARICEPT) 10 MG tablet Take 10 mg by mouth at bedtime. 03/06/21   [provider]  fish oil-omega-3 fatty acids 1000 MG capsule Take 1 g by mouth daily.    [provider]  Glucosamine-Chondroitin 500-400 MG CAPS Take 1 tablet by mouth daily.    [provider]  latanoprost (XALATAN) 0.005 % ophthalmic solution Place 1 drop into both eyes at bedtime.    [provider]  levETIRAcetam (KEPPRA) 500 MG tablet TAKE 1 TABLET (500 MG TOTAL) BY MOUTH TWO TIMES DAILY. Patient taking differently: Take 500 mg by mouth 2 (two) times daily. 03/11/21 03/11/22  [name], MD  melatonin 5 MG TABS Take 10 mg by mouth at bedtime.    [provider]  memantine (NAMENDA) 10 MG tablet Take 1 tablet twice a day Patient taking differently: Take 10 mg by mouth 2 (two) times daily. 03/22/21   Van Clines, MD  MILK THISTLE PO Take 140 mg by mouth daily.    [provider]  Multiple Vitamins-Minerals (MULTIVITAMIN WITH MINERALS) tablet Take 1 tablet by mouth daily.    [provider]  NON FORMULARY 250 mLs See admin instructions. Give extra 250 ml of fluid every shift for hypenatremia    [provider]  polyethylene glycol (MIRALAX / GLYCOLAX) 17 g packet Take 17 g by mouth daily.    [provider]  RHOPRESSA 0.02 % SOLN Place 1 drop into both eyes at bedtime. 08/17/19   [provider]  risperiDONE (RISPERDAL) 0.5 MG tablet Take 1 tablet (0.5 mg total) by mouth at bedtime. 02/25/20   [name], MD  sennosides-docusate sodium (SENOKOT-S) 8.6-50 MG tablet Take 2 tablets by mouth at bedtime.    [provider]  tamsulosin (FLOMAX) 0.4 MG CAPS capsule Take 0.4 mg by mouth every other day.    [provider]    Allergies    Chocolate, Ciprofloxacin, and Lipitor [atorvastatin]  Review of Systems   Review of Systems  Unable to perform ROS: Dementia    Physical Exam Updated Vital Signs BP (!) 187/94 (BP Location: Left Arm)   Pulse 80   Temp 99.3 F (37.4 C) (Rectal)   Resp 20   SpO2 100%   Physical Exam Vitals and nursing note reviewed.  Constitutional:      General: He is in acute distress.     Appearance: He is ill-appearing. He is not toxic-appearing or diaphoretic.  HENT:     Head: Normocephalic and atraumatic.     Right Ear: External ear normal.     Left Ear: External ear normal.  Nose: Nose normal.     Mouth/Throat:     Mouth: Mucous membranes are moist.     Pharynx: Oropharynx is clear. No oropharyngeal exudate or posterior oropharyngeal erythema.  Eyes:     General: No scleral icterus.       Right eye: No discharge.        Left eye: No discharge.     Extraocular Movements: Extraocular movements intact.     Conjunctiva/sclera: Conjunctivae normal.  Cardiovascular:     Rate and Rhythm: Normal rate and regular rhythm.     Pulses: Normal pulses.     Heart sounds: Normal heart sounds. No murmur heard.   No friction rub. No gallop.  Pulmonary:     Effort: Pulmonary effort is normal. No respiratory distress.     Breath sounds: No stridor. Rhonchi present. No wheezing or rales.  Abdominal:     General: Abdomen is flat.     Palpations: Abdomen is soft.     Tenderness: There is no abdominal tenderness.     Comments: Abdomen is flat and soft.  Does not exhibit signs of tenderness with deep palpation.  Musculoskeletal:        General: Normal range of motion.     Cervical back: Normal range of motion and neck supple. No tenderness.     Right lower leg: No edema.     Left lower leg: No edema.  Skin:    General: Skin is warm and dry.  Neurological:     General: No focal deficit present.     Comments: Fatigued appearing.  Lying in the semi-Fowler's position.  A&O x1.  Otherwise not answering questions or following commands.  Psychiatric:        Mood and Affect: Mood normal.        Behavior: Behavior normal.    ED Results / Procedures / Treatments   Labs (all labs ordered are listed, but only abnormal results are displayed) Labs Reviewed  CBC WITH DIFFERENTIAL/PLATELET - Abnormal; Notable for the following components:      Result Value   Hemoglobin 12.4 (*)    Monocytes Absolute 1.1 (*)    All other components within normal limits  URINALYSIS, ROUTINE W REFLEX MICROSCOPIC - Abnormal; Notable for the following components:   Leukocytes,Ua TRACE (*)    Bacteria, UA RARE (*)    All other components within normal limits  COMPREHENSIVE METABOLIC PANEL - Abnormal; Notable for the following components:   Glucose, Bld 114 (*)    Total Protein 6.1 (*)    Albumin 2.7 (*)    Total Bilirubin 0.2 (*)    All other components within normal limits  CBG MONITORING, ED - Abnormal; Notable for the following components:   Glucose-Capillary 130 (*)    All other components within normal limits  RESP PANEL BY RT-PCR (FLU A&B, COVID) [id number]  CULTURE, BLOOD (ROUTINE X 2)  CULTURE, BLOOD (ROUTINE X 2)  URINE CULTURE  LACTIC ACID, PLASMA  PROTIME-INR  APTT  LACTIC ACID, PLASMA   EKG None  Radiology CT HEAD WO CONTRAST (5MM)  Result Date: 11/18/2021 CLINICAL DATA:  Delirium. EXAM: CT HEAD WITHOUT CONTRAST TECHNIQUE: Contiguous axial images were obtained from the base of the skull through the vertex without intravenous contrast. COMPARISON:  10/25/2021. FINDINGS: Brain: No acute intracranial hemorrhage, midline shift or mass effect. Generalized atrophy is noted. Subcortical and periventricular white matter hypodensities are seen bilaterally. There is no hydrocephalus. Vascular: No hyperdense vessel or unexpected calcification. Skull: Normal. Negative  for fracture or focal lesion. Sinuses/Orbits: Round density is present in the left maxillary sinus, possible mucosal retention cyst or polyp. There is partial opacification of the ethmoid air cells bilaterally. The orbits are stable. Other: None. IMPRESSION: Stable  head CT with no acute intracranial process. Electronically Signed   By: Brett Fairy M.D.   On: 11/18/2021 20:48   CT Chest Wo Contrast  Result Date: 11/18/2021 CLINICAL DATA:  Chest pain, shortness of breath. Altered mental status. Recent pneumonia. Recent COVID positive. EXAM: CT CHEST WITHOUT CONTRAST TECHNIQUE: Multidetector CT imaging of the chest was performed following the standard protocol without IV contrast. COMPARISON:  None. FINDINGS: Cardiovascular: Mild cardiomegaly. Small pericardial effusion is present. Calcified plaque over the left anterior descending and lateral circumflex coronary arteries. No evidence of thoracic aortic aneurysm. Calcified plaque is present over the thoracic aorta. Remaining vascular structures are unremarkable. Mediastinum/Nodes: No definite mediastinal or hilar adenopathy. Remaining mediastinal structures are unremarkable. Lungs/Pleura: Lungs are adequately inflated and demonstrate patchy hazy opacification over the mid to lower lungs bilaterally. Consolidation over the left base likely compressive atelectasis with small to moderate left effusion. Airways are normal. Upper Abdomen: Minimal calcified plaque over the abdominal aorta. Oval partially calcified rounded mass over the upper pole right kidney slightly smaller compared to 2015. No acute findings. Musculoskeletal: Degenerative change of the spine. No focal abnormality. IMPRESSION: 1. Patchy bilateral hazy airspace process which may be due to infection bacterial or viral origin. Small to moderate left pleural effusion with associated left basilar consolidation likely compressive atelectasis. 2. Mild cardiomegaly with small pericardial effusion. Atherosclerotic coronary artery disease. 3. Aortic atherosclerosis. 4. Oval partially calcified rounded mass over the upper pole right kidney slightly smaller compared to 2015. Aortic Atherosclerosis (ICD10-I70.0). Electronically Signed   By: Marin Olp M.D.   On:  11/18/2021 20:52   DG Chest Port 1 View  Result Date: 11/18/2021 CLINICAL DATA:  Possible sepsis. Altered mental status and shortness of breath. EXAM: PORTABLE CHEST 1 VIEW COMPARISON:  10/25/2021 FINDINGS: Lungs are somewhat hypoinflated with continued hazy opacification over the lung bases left worse than right. Left effusion and atelectasis is likely. Infection in the lung bases is possible. Cardiomediastinal silhouette and remainder of the exam is unchanged. IMPRESSION: Stable bibasilar opacification left worse than right likely effusions with atelectasis. Infection in the lung bases is possible. Electronically Signed   By: Marin Olp M.D.   On: 11/18/2021 19:38    Procedures Procedures   Medications Ordered in ED Medications - No data to display  ED Course  I have reviewed the triage vital signs and the nursing notes.  Pertinent labs & imaging results that were available during my care of the patient were reviewed by me and considered in my medical decision making (see chart for details).    MDM Rules/Calculators/A&P                          Pt is a 86 y.o. male with history of dementia who presents to the emergency department due to altered mental status as well as worsening rhonchi.  Labs: CBC with a hemoglobin of 12.4 and monocytes of 1.1. CMP with a glucose of 114, total protein of 6.1, albumin of 2.7, total bilirubin 0.2. UA with trace leukocytes and rare bacteria. Lactic acid 1.7 Respiratory panel is negative.  Imaging: Chest x-ray showing IMPRESSION: Stable bibasilar opacification left worse than right likely effusions with atelectasis. Infection in the lung bases is  possible.  CT of the chest showing IMPRESSION: 1. Patchy bilateral hazy airspace process which may be due to infection bacterial or viral origin. Small to moderate left pleural effusion with associated left basilar consolidation likely compressive atelectasis. 2. Mild cardiomegaly with small pericardial  effusion. Atherosclerotic coronary artery disease. 3. Aortic atherosclerosis. 4. Oval partially calcified rounded mass over the upper pole right kidney slightly smaller compared to 2015. Aortic Atherosclerosis  CT scan of the head without contrast showing IMPRESSION: Stable head CT with no acute intracranial process.  I, Rayna Sexton, PA-C, personally reviewed and evaluated these images and lab results as part of my medical decision-making.  CT concerning for patchy bilateral hazy airspace process which could be due to infection.  Possibly continued pneumonia from his previous admission.  Lab work is generally reassuring.  Patient afebrile and nontachycardic.  Respiratory panel is negative.  CBC without leukocytosis.  UA does not appear infectious.  Feel the patient's possible pneumonia is likely the source of his symptoms today.  Given his age and comorbidities feel that patient will require admission for altered mental status as well as pneumonia.  We will discuss with the medicine team.  Patient discussed with the medicine team.  Recommended that we also obtain an ammonia level.  Agree with starting antibiotics.  We will start vancomycin as well as cefepime.  Note: Portions of this report may have been transcribed using voice recognition software. Every effort was made to ensure accuracy; however, inadvertent computerized transcription errors may be present.   Final Clinical Impression(s) / ED Diagnoses Final diagnoses:  Altered mental status, unspecified altered mental status type  Recurrent pneumonia   Rx / DC Orders ED Discharge Orders     None        Rayna Sexton, PA-C 11/18/21 [id number]    Fredia Sorrow, MD 11/18/21 [id number]

## 2021-11-18 NOTE — H&P (Addendum)
History and Physical    [name] [name] [id number] DOB: 01/27/1935 DOA: 11/18/2021  PCP: Raymondo Band, MD  Patient coming from: Skilled nursing facility.  Chief Complaint: Altered mental status.  HPI: [name] [name] is a 86 y.o. male with known history of dementia, seizures, BPH and decubitus ulcers and pericardial effusion anemia was recently admitted for pneumonia discharged about 2 weeks ago was brought to the ER after patient was found to be increasingly confused and lethargic since this morning.  No further history is available.  We will try to reach family to get further history.  ED Course: In the ER patient is afebrile appeared confused but following commands.  Patient had CT head which was unremarkable CT chest was showing bilateral infiltrates concerning for pneumonia also was showing mild pericardial effusion.  Labs were significant for negative COVID test and flu test.  Hemoglobin is at around baseline and metabolic panel was largely unremarkable.  Patient started on antibiotic admitted for further observation.  Review of Systems: As per HPI, rest all negative.   Past Medical History:  Diagnosis Date   Allergy    Dementia [facility])    sees Dr. Ellouise Newer    Diverticulosis of colon (without mention of hemorrhage) 03-01-2004, 04-04-2011   Colonoscopy   ED (erectile dysfunction)    Glaucoma    sees Dr. Crecencio Mc    Hyperglycemia    Hyperlipidemia    Hypertension    Internal hemorrhoids 03-15-1999   Flex Sig    Irritable bowel     Past Surgical History:  Procedure Laterality Date   COLONOSCOPY  04-04-11   per Dr. Sharlett Iles, clear, no repeats needed   EYE SURGERY     bilateral cataract extraction per Dr Dolores Lory   INGUINAL HERNIA REPAIR     KNEE ARTHROSCOPY     right knee   NASAL SINUS SURGERY       reports that he quit smoking about 52 years ago. He has never used smokeless tobacco. He reports current alcohol use. He reports that he does not use  drugs.  Allergies  Allergen Reactions   Chocolate     Itching if he eats too much chocolate    Ciprofloxacin     Achilles tendon pain    Lipitor [Atorvastatin]     Muscle aches     Family History  Problem Relation Age of Onset   Hypertension Mother    Lung cancer Father    Colitis Daughter     Prior to Admission medications   Medication Sig Start Date End Date Taking? Authorizing Provider  acetaminophen (TYLENOL) 500 MG tablet Take 500 mg by mouth 3 (three) times daily.   Yes [provider]  aluminum-magnesium hydroxide-simethicone (MAALOX) 200-200-20 MG/5ML SUSP Take 30 mLs by mouth every 4 (four) hours as needed (for indigestion).   Yes [provider]  AZOPT 1 % ophthalmic suspension Place 1 drop into both eyes 3 (three) times daily. 12/18/19  Yes [provider]  Coenzyme Q10 (CO Q 10 PO) Take 30 mg by mouth daily.   Yes [provider]  divalproex (DEPAKOTE) 500 MG DR tablet Take 500-1,000 mg by mouth See admin instructions. Give 1000mg  by mouth in the morning and 500mg  at bedtime 11/09/21  Yes [provider]  donepezil (ARICEPT) 10 MG tablet Take 10 mg by mouth at bedtime. 03/06/21  Yes [provider]  fish oil-omega-3 fatty acids 1000 MG capsule Take 1 g by mouth daily.  Yes [provider]  Glucosamine-Chondroitin 500-400 MG CAPS Take 1 tablet by mouth daily.   Yes [provider]  guaiFENesin (ROBITUSSIN) 100 MG/5ML liquid Take 15 mLs by mouth every 4 (four) hours as needed for cough or to loosen phlegm.   Yes [provider]  latanoprost (XALATAN) 0.005 % ophthalmic solution Place 1 drop into both eyes at bedtime.   Yes [provider]  levETIRAcetam (KEPPRA) 500 MG tablet TAKE 1 TABLET (500 MG TOTAL) BY MOUTH TWO TIMES DAILY. Patient taking differently: Take 500 mg by mouth 2 (two) times daily. 03/11/21 03/11/22 Yes [name], MD  loperamide (IMODIUM) 2 MG capsule Take 2 mg  by mouth as needed for diarrhea or loose stools (DO NOT EXCEED 8 CAPSULES IN 24HRS).   Yes [provider]  magnesium hydroxide (MILK OF MAGNESIA) 400 MG/5ML suspension Take 30 mLs by mouth daily as needed for mild constipation.   Yes [provider]  melatonin 5 MG TABS Take 10 mg by mouth at bedtime.   Yes [provider]  memantine (NAMENDA) 10 MG tablet Take 1 tablet twice a day Patient taking differently: Take 10 mg by mouth at bedtime. 03/22/21  Yes Van Clines, MD  MILK THISTLE PO Take 140 mg by mouth daily.   Yes [provider]  Multiple Vitamins-Minerals (MULTIVITAMIN WITH MINERALS) tablet Take 1 tablet by mouth daily.   Yes [provider]  polyethylene glycol (MIRALAX / GLYCOLAX) 17 g packet Take 17 g by mouth daily.   Yes [provider]  promethazine (PHENERGAN) 25 MG tablet Take 25 mg by mouth every 4 (four) hours as needed for nausea or vomiting.   Yes [provider]  RHOPRESSA 0.02 % SOLN Place 1 drop into both eyes at bedtime. 08/17/19  Yes [provider]  risperiDONE (RISPERDAL) 0.5 MG tablet Take 1 tablet (0.5 mg total) by mouth at bedtime. 02/25/20  Yes [name], MD  senna (SENOKOT) 8.6 MG TABS tablet Take 2 tablets by mouth at bedtime.   Yes [provider]  tamsulosin (FLOMAX) 0.4 MG CAPS capsule Take 0.4 mg by mouth every other day.   Yes [provider]  vitamin C (ASCORBIC ACID) 250 MG tablet Take 250 mg by mouth daily.   Yes [provider]    Physical Exam: Constitutional: Moderately built and nourished. Vitals:   11/18/21 1930 11/18/21 2002 11/18/21 2111 11/18/21 2329  BP: (!) 158/87 (!) 178/101 (!) 187/94 (!) 160/91  Pulse: 74 75 80 78  Resp: 17 20 20 20   Temp:    98.5 F (36.9 C)  TempSrc:    Oral  SpO2: 96% 99% 100% 99%   Eyes: Anicteric no pallor. ENMT: No discharge from the ears eyes nose and mouth. Neck: No mass felt.  No neck  rigidity. Respiratory: No rhonchi or crepitations. Cardiovascular: S1-S2 heard. Abdomen: Soft nontender bowel sound present. Musculoskeletal: Complains of some pain around the hip area. Skin: Stage II decubitus ulcer on the sacral and bilateral heel. Neurologic: Alert awake oriented to his name.  Moving extremities but has some pain around the hip area. Psychiatric: Has dementia oriented to name.   Labs on Admission: I have personally reviewed following labs and imaging studies  CBC: Recent Labs  Lab 11/18/21 1904  WBC 9.5  NEUTROABS 7.4  HGB 12.4*  HCT 40.5  MCV 87.7  PLT 299   Basic Metabolic Panel: Recent Labs  Lab 11/18/21 1926  NA 141  K 4.3  CL 103  CO2 29  GLUCOSE 114*  BUN 17  CREATININE 0.72  CALCIUM 8.9   GFR: Estimated Creatinine Clearance: 76.3 mL/min (by C-G formula based on SCr of 0.72 mg/dL). Liver Function Tests: Recent Labs  Lab 11/18/21 1926  AST 25  ALT 17  ALKPHOS 59  BILITOT 0.2*  PROT 6.1*  ALBUMIN 2.7*   No results for input(s): LIPASE, AMYLASE in the last 168 hours. No results for input(s): AMMONIA in the last 168 hours. Coagulation Profile: Recent Labs  Lab 11/18/21 1904  INR 1.0   Cardiac Enzymes: No results for input(s): CKTOTAL, CKMB, CKMBINDEX, TROPONINI in the last 168 hours. BNP (last 3 results) No results for input(s): PROBNP in the last 8760 hours. HbA1C: No results for input(s): HGBA1C in the last 72 hours. CBG: Recent Labs  Lab 11/18/21 1849  GLUCAP 130*   Lipid Profile: No results for input(s): CHOL, HDL, LDLCALC, TRIG, CHOLHDL, LDLDIRECT in the last 72 hours. Thyroid Function Tests: No results for input(s): TSH, T4TOTAL, FREET4, T3FREE, THYROIDAB in the last 72 hours. Anemia Panel: No results for input(s): VITAMINB12, FOLATE, FERRITIN, TIBC, IRON, RETICCTPCT in the last 72 hours. Urine analysis:    Component Value Date/Time   COLORURINE YELLOW 11/18/2021 1904   APPEARANCEUR CLEAR 11/18/2021 1904    LABSPEC 1.026 11/18/2021 1904   PHURINE 6.0 11/18/2021 1904   GLUCOSEU NEGATIVE 11/18/2021 1904   GLUCOSEU NEGATIVE 02/06/2020 1032   HGBUR NEGATIVE 11/18/2021 1904   HGBUR negative 09/16/2010 [place] 11/18/2021 1904   BILIRUBINUR neg 02/22/2019 [place] 11/18/2021 1904   PROTEINUR NEGATIVE 11/18/2021 1904   UROBILINOGEN 0.2 02/06/2020 1032   NITRITE NEGATIVE 11/18/2021 1904   LEUKOCYTESUR TRACE (A) 11/18/2021 1904   Sepsis Labs: @LABRCNTIP (procalcitonin:4,lacticidven:4) ) Recent Results (from the past 240 hour(s))  Resp Panel by RT-PCR (Flu A&B, Covid) Urine, Clean Catch     Status: None   Collection Time: 11/18/21  7:18 PM   Specimen: Urine, Clean Catch; Nasopharyngeal(NP) swabs in vial transport medium  Result Value Ref Range Status   SARS Coronavirus 2 by RT PCR NEGATIVE NEGATIVE Final    Comment: (NOTE) SARS-CoV-2 target nucleic acids are NOT DETECTED.  The SARS-CoV-2 RNA is generally detectable in upper respiratory specimens during the acute phase of infection. The lowest concentration of SARS-CoV-2 viral copies this assay can detect is 138 copies/mL. A negative result does not preclude SARS-Cov-2 infection and should not be used as the sole basis for treatment or other patient management decisions. A negative result may occur with  improper specimen collection/handling, submission of specimen other than nasopharyngeal swab, presence of viral mutation(s) within the areas targeted by this assay, and inadequate number of viral copies(<138 copies/mL). A negative result must be combined with clinical observations, patient history, and epidemiological information. The expected result is Negative.  Fact Sheet for Patients:  EntrepreneurPulse.com.au  Fact Sheet for Healthcare Providers:  IncredibleEmployment.be  This test is no t yet approved or cleared by the [place] FDA and  has been  authorized for detection and/or diagnosis of SARS-CoV-2 by FDA under an Emergency Use Authorization (EUA). This EUA will remain  in effect (meaning this test can be used) for the duration of the COVID-19 declaration under Section 564(b)(1) of the Act, 21 U.S.C.section 360bbb-3(b)(1), unless the authorization is terminated  or revoked sooner.       Influenza A by PCR NEGATIVE NEGATIVE Final   Influenza B by PCR NEGATIVE NEGATIVE  Final    Comment: (NOTE) The Xpert Xpress SARS-CoV-2/FLU/RSV plus assay is intended as an aid in the diagnosis of influenza from Nasopharyngeal swab specimens and should not be used as a sole basis for treatment. Nasal washings and aspirates are unacceptable for Xpert Xpress SARS-CoV-2/FLU/RSV testing.  Fact Sheet for Patients: EntrepreneurPulse.com.au  Fact Sheet for Healthcare Providers: IncredibleEmployment.be  This test is not yet approved or cleared by the [place] FDA and has been authorized for detection and/or diagnosis of SARS-CoV-2 by FDA under an Emergency Use Authorization (EUA). This EUA will remain in effect (meaning this test can be used) for the duration of the COVID-19 declaration under Section 564(b)(1) of the Act, 21 U.S.C. section 360bbb-3(b)(1), unless the authorization is terminated or revoked.  Performed at [facility], [place] [address], [place], [place] [address]      Radiological Exams on Admission: CT HEAD WO CONTRAST (5MM)  Result Date: 11/18/2021 CLINICAL DATA:  Delirium. EXAM: CT HEAD WITHOUT CONTRAST TECHNIQUE: Contiguous axial images were obtained from the base of the skull through the vertex without intravenous contrast. COMPARISON:  10/25/2021. FINDINGS: Brain: No acute intracranial hemorrhage, midline shift or mass effect. Generalized atrophy is noted. Subcortical and periventricular white matter hypodensities are seen bilaterally. There is no hydrocephalus.  Vascular: No hyperdense vessel or unexpected calcification. Skull: Normal. Negative for fracture or focal lesion. Sinuses/Orbits: Round density is present in the left maxillary sinus, possible mucosal retention cyst or polyp. There is partial opacification of the ethmoid air cells bilaterally. The orbits are stable. Other: None. IMPRESSION: Stable head CT with no acute intracranial process. Electronically Signed   By: Brett Fairy M.D.   On: 11/18/2021 20:48   CT Chest Wo Contrast  Result Date: 11/18/2021 CLINICAL DATA:  Chest pain, shortness of breath. Altered mental status. Recent pneumonia. Recent COVID positive. EXAM: CT CHEST WITHOUT CONTRAST TECHNIQUE: Multidetector CT imaging of the chest was performed following the standard protocol without IV contrast. COMPARISON:  None. FINDINGS: Cardiovascular: Mild cardiomegaly. Small pericardial effusion is present. Calcified plaque over the left anterior descending and lateral circumflex coronary arteries. No evidence of thoracic aortic aneurysm. Calcified plaque is present over the thoracic aorta. Remaining vascular structures are unremarkable. Mediastinum/Nodes: No definite mediastinal or hilar adenopathy. Remaining mediastinal structures are unremarkable. Lungs/Pleura: Lungs are adequately inflated and demonstrate patchy hazy opacification over the mid to lower lungs bilaterally. Consolidation over the left base likely compressive atelectasis with small to moderate left effusion. Airways are normal. Upper Abdomen: Minimal calcified plaque over the abdominal aorta. Oval partially calcified rounded mass over the upper pole right kidney slightly smaller compared to 2015. No acute findings. Musculoskeletal: Degenerative change of the spine. No focal abnormality. IMPRESSION: 1. Patchy bilateral hazy airspace process which may be due to infection bacterial or viral origin. Small to moderate left pleural effusion with associated left basilar consolidation likely  compressive atelectasis. 2. Mild cardiomegaly with small pericardial effusion. Atherosclerotic coronary artery disease. 3. Aortic atherosclerosis. 4. Oval partially calcified rounded mass over the upper pole right kidney slightly smaller compared to 2015. Aortic Atherosclerosis (ICD10-I70.0). Electronically Signed   By: Marin Olp M.D.   On: 11/18/2021 20:52   DG Chest Port 1 View  Result Date: 11/18/2021 CLINICAL DATA:  Possible sepsis. Altered mental status and shortness of breath. EXAM: PORTABLE CHEST 1 VIEW COMPARISON:  10/25/2021 FINDINGS: Lungs are somewhat hypoinflated with continued hazy opacification over the lung bases left worse than right. Left effusion and atelectasis is likely. Infection in the  lung bases is possible. Cardiomediastinal silhouette and remainder of the exam is unchanged. IMPRESSION: Stable bibasilar opacification left worse than right likely effusions with atelectasis. Infection in the lung bases is possible. Electronically Signed   By: Marin Olp M.D.   On: 11/18/2021 19:38    EKG: Independently reviewed.  Normal sinus rhythm RBBB.  Assessment/Plan Principal Problem:   Acute encephalopathy Active Problems:   Essential hypertension   Dementia (HCC)   Seizure (HCC)   Pressure injury of skin   Recurrent pneumonia    Acute encephalopathy suspect likely from pneumonia for which patient is on empiric antibiotics.  COVID test was negative.  In addition we will check Depakote levels ammonia levels.  Patient CT scan also did show left pleural effusion.  No loculations seen.  If patient spikes a fever may need to have thoracentesis. History of seizures on Depakote and Keppra. History of hypertension I do not see any medications in the list.  We will keep patient.  IV hydralazine follow blood pressure trends. Stage II sacral and heel ulcers.  Appears to be noninfected at this time. Medical effusion seen in the CAT scan appears to be chronic.  2D echo done in May of  this year did show moderate pericardial effusion.  We will repeat 2D echo admission. Chronic anemia hemoglobin is at around baseline.  Check anemia panel with next blood draw. BPH we will continue Flomax.  Will need to get further history when family available.  Since patient has some pain around the hip.  I will order x-ray of the pelvis.  DVT prophylaxis: Lovenox. Code Status: Full code. Family Communication: We will need to discuss with family. Disposition Plan: Back to facility when stable. Consults called: None. Admission status: Observation.   Rise Patience MD [facility] Pager [phone].  If 7PM-7AM, please contact night-coverage [email or web address] Password [facility]  11/18/2021, 11:58 PM

## 2021-11-18 NOTE — ED Notes (Signed)
ED TO INPATIENT HANDOFF REPORT  Name/Age/Gender [name] BenceJoe L [name] 86 y.o. male  Code Status Code Status History     Date Active Date Inactive Code Status Order ID Comments User Context   10/21/2021 0236 11/05/2021 2107 Full Code [id number]  Eduard ClosKakrakandy, Arshad N, MD ED   05/07/2021 1920 05/12/2021 1938 Full Code [id number]  Synetta FailMelvin, Alexander B, MD ED   03/09/2021 1821 03/11/2021 2310 Full Code [id number]  Emeline GeneralZhang, Ping T, MD ED   02/08/2020 1940 02/12/2020 1744 Full Code [id number]  Melene PlanFloyd, Dan, DO ED   08/21/2014 1956 08/28/2014 2122 Full Code [id number]  Dorothea OgleMyers, Iskra M, MD Inpatient   08/20/2014 0349 08/20/2014 2002 Full Code [id number]  Eduard ClosKakrakandy, Arshad N, MD Inpatient   05/26/2012 0311 05/28/2012 1503 Full Code [id number]  Eduard ClosKakrakandy, Arshad N, MD Inpatient       Home/SNF/Other Skilled nursing facility  Chief Complaint Acute encephalopathy [G93.40]  Level of Care/Admitting Diagnosis ED Disposition     ED Disposition  Admit   Condition  --   Comment  [facility] [[id number]  Level of Care: Progressive [102]  Admit to Progressive based on following criteria: MULTISYSTEM THREATS such as stable sepsis, metabolic/electrolyte imbalance with or without encephalopathy that is responding to early treatment.  [facility] if equivalent level of care is available:: No  Covid Evaluation: Confirmed COVID Negative  Diagnosis: Acute encephalopathy [[id number]][[id number]]  Admitting Physician: Eduard ClosKAKRAKANDY, ARSHAD N [phone][[id number]]  Attending Physician: Eduard ClosKAKRAKANDY, ARSHAD N [phone][[id number]]          Medical History Past Medical History:  Diagnosis Date   Allergy    Dementia [facility](HCC)    sees Dr. Patrcia DollyKaren Aquino    Diverticulosis of colon (without mention of hemorrhage) 03-01-2004, 04-04-2011   Colonoscopy   ED (erectile dysfunction)    Glaucoma    sees Dr. Arvil Chacooy Whittaker    Hyperglycemia    Hyperlipidemia    Hypertension    Internal hemorrhoids 03-15-1999    Flex Sig    Irritable bowel     Allergies Allergies  Allergen Reactions   Chocolate     Itching if he eats too much chocolate    Ciprofloxacin     Achilles tendon pain    Lipitor [Atorvastatin]     Muscle aches     IV Location/Drains/Wounds Patient Lines/Drains/Airways Status     Active Line/Drains/Airways     Name Placement date Placement time Site Days   External Urinary Catheter 10/22/21  1705  --  27   Pressure Injury 05/08/21 Coccyx Mid Stage 1 -  Intact skin with non-blanchable redness of a localized area usually over a bony prominence. 05/08/21  0515  -- 194   Pressure Injury 10/25/21 Heel Left Deep Tissue Pressure Injury - Purple or maroon localized area of discolored intact skin or blood-filled blister due to damage of underlying soft tissue from pressure and/or shear. purple wound on heel 10/25/21  0800  -- 24   Pressure Injury 10/25/21 Heel Right Deep Tissue Pressure Injury - Purple or maroon localized area of discolored intact skin or blood-filled blister due to damage of underlying soft tissue from pressure and/or shear. purple wound on heel 10/25/21  0800  -- 24            Labs/Imaging Results for orders placed or performed during the hospital encounter of 11/18/21 (from the past 48 hour(s))  CBG monitoring, ED     Status: Abnormal   Collection Time:  11/18/21  6:49 PM  Result Value Ref Range   Glucose-Capillary 130 (H) 70 - 99 mg/dL    Comment: Glucose reference range applies only to samples taken after fasting for at least 8 hours.  Lactic acid, plasma     Status: None   Collection Time: 11/18/21  7:04 PM  Result Value Ref Range   Lactic Acid, Venous 1.7 0.5 - 1.9 mmol/L    Comment: Performed at [facility], [place] [address], [place], [place] [address]  CBC WITH DIFFERENTIAL     Status: Abnormal   Collection Time: 11/18/21  7:04 PM  Result Value Ref Range   WBC 9.5 4.0 - 10.5 K/uL   RBC 4.62 4.22 - 5.81 MIL/uL   Hemoglobin 12.4 (L)  13.0 - 17.0 g/dL   HCT 40.5 39.0 - 52.0 %   MCV 87.7 80.0 - 100.0 fL   MCH 26.8 26.0 - 34.0 pg   MCHC 30.6 30.0 - 36.0 g/dL   RDW 14.9 11.5 - 15.5 %   Platelets 299 150 - 400 K/uL   nRBC 0.0 0.0 - 0.2 %   Neutrophils Relative % 78 %   Neutro Abs 7.4 1.7 - 7.7 K/uL   Lymphocytes Relative 9 %   Lymphs Abs 0.8 0.7 - 4.0 K/uL   Monocytes Relative 12 %   Monocytes Absolute 1.1 (H) 0.1 - 1.0 K/uL   Eosinophils Relative 1 %   Eosinophils Absolute 0.1 0.0 - 0.5 K/uL   Basophils Relative 0 %   Basophils Absolute 0.0 0.0 - 0.1 K/uL   Immature Granulocytes 0 %   Abs Immature Granulocytes 0.04 0.00 - 0.07 K/uL    Comment: Performed at [facility], [place] [address], [place], [place] [address]  Protime-INR     Status: None   Collection Time: 11/18/21  7:04 PM  Result Value Ref Range   Prothrombin Time 13.6 11.4 - 15.2 seconds   INR 1.0 0.8 - 1.2    Comment: (NOTE) INR goal varies based on device and disease states. Performed at [facility], [place] [address], [place], [place] [address]   APTT     Status: None   Collection Time: 11/18/21  7:04 PM  Result Value Ref Range   aPTT 33 24 - 36 seconds    Comment: Performed at [facility], [place] [address] [place], [place] [address]  Urinalysis, Routine w reflex microscopic Urine, Clean Catch     Status: Abnormal   Collection Time: 11/18/21  7:04 PM  Result Value Ref Range   Color, Urine YELLOW YELLOW   APPearance CLEAR CLEAR   Specific Gravity, Urine 1.026 1.005 - 1.030   pH 6.0 5.0 - 8.0   Glucose, UA NEGATIVE NEGATIVE mg/dL   Hgb urine dipstick NEGATIVE NEGATIVE   Bilirubin Urine NEGATIVE NEGATIVE   Ketones, ur NEGATIVE NEGATIVE mg/dL   Protein, ur NEGATIVE NEGATIVE mg/dL   Nitrite NEGATIVE NEGATIVE   Leukocytes,Ua TRACE (A) NEGATIVE   RBC / HPF 0-5 0 - 5 RBC/hpf   WBC, UA 6-10 0 - 5 WBC/hpf   Bacteria, UA RARE (A) NONE SEEN   Squamous Epithelial / LPF 0-5 0 - 5   Mucus PRESENT     Hyaline Casts, UA PRESENT     Comment: Performed at [facility], [place] [address] [place], [place] [address]  Resp Panel by RT-PCR (Flu A&B, Covid) Urine, Clean Catch     Status: None   Collection  Time: 11/18/21  7:18 PM   Specimen: Urine, Clean Catch; Nasopharyngeal(NP) swabs in vial transport medium  Result Value Ref Range   SARS Coronavirus 2 by RT PCR NEGATIVE NEGATIVE    Comment: (NOTE) SARS-CoV-2 target nucleic acids are NOT DETECTED.  The SARS-CoV-2 RNA is generally detectable in upper respiratory specimens during the acute phase of infection. The lowest concentration of SARS-CoV-2 viral copies this assay can detect is 138 copies/mL. A negative result does not preclude SARS-Cov-2 infection and should not be used as the sole basis for treatment or other patient management decisions. A negative result may occur with  improper specimen collection/handling, submission of specimen other than nasopharyngeal swab, presence of viral mutation(s) within the areas targeted by this assay, and inadequate number of viral copies(<138 copies/mL). A negative result must be combined with clinical observations, patient history, and epidemiological information. The expected result is Negative.  Fact Sheet for Patients:  BloggerCourse.com  Fact Sheet for Healthcare Providers:  SeriousBroker.it  This test is no t yet approved or cleared by the [place] FDA and  has been authorized for detection and/or diagnosis of SARS-CoV-2 by FDA under an Emergency Use Authorization (EUA). This EUA will remain  in effect (meaning this test can be used) for the duration of the COVID-19 declaration under Section 564(b)(1) of the Act, 21 U.S.C.section 360bbb-3(b)(1), unless the authorization is terminated  or revoked sooner.       Influenza A by PCR NEGATIVE NEGATIVE   Influenza B by PCR NEGATIVE NEGATIVE    Comment: (NOTE) The  Xpert Xpress SARS-CoV-2/FLU/RSV plus assay is intended as an aid in the diagnosis of influenza from Nasopharyngeal swab specimens and should not be used as a sole basis for treatment. Nasal washings and aspirates are unacceptable for Xpert Xpress SARS-CoV-2/FLU/RSV testing.  Fact Sheet for Patients: BloggerCourse.com  Fact Sheet for Healthcare Providers: SeriousBroker.it  This test is not yet approved or cleared by the [place] FDA and has been authorized for detection and/or diagnosis of SARS-CoV-2 by FDA under an Emergency Use Authorization (EUA). This EUA will remain in effect (meaning this test can be used) for the duration of the COVID-19 declaration under Section 564(b)(1) of the Act, 21 U.S.C. section 360bbb-3(b)(1), unless the authorization is terminated or revoked.  Performed at [facility], [address], [place], [place] [address]   Comprehensive metabolic panel     Status: Abnormal   Collection Time: 11/18/21  7:26 PM  Result Value Ref Range   Sodium 141 135 - 145 mmol/L   Potassium 4.3 3.5 - 5.1 mmol/L   Chloride 103 98 - 111 mmol/L   CO2 29 22 - 32 mmol/L   Glucose, Bld 114 (H) 70 - 99 mg/dL    Comment: Glucose reference range applies only to samples taken after fasting for at least 8 hours.   BUN 17 8 - 23 mg/dL   Creatinine, Ser 5.95 0.61 - 1.24 mg/dL   Calcium 8.9 8.9 - 63.8 mg/dL   Total Protein 6.1 (L) 6.5 - 8.1 g/dL   Albumin 2.7 (L) 3.5 - 5.0 g/dL   AST 25 15 - 41 U/L   ALT 17 0 - 44 U/L   Alkaline Phosphatase 59 38 - 126 U/L   Total Bilirubin 0.2 (L) 0.3 - 1.2 mg/dL   GFR, Estimated >75 >64 mL/min    Comment: (NOTE) Calculated using the CKD-EPI Creatinine Equation (2021)    Anion gap 9 5 - 15    Comment: Performed  at [facility], [place] [address], [place], [place] [address]   CT HEAD WO CONTRAST (5MM)  Result Date: 11/18/2021 CLINICAL DATA:  Delirium.  EXAM: CT HEAD WITHOUT CONTRAST TECHNIQUE: Contiguous axial images were obtained from the base of the skull through the vertex without intravenous contrast. COMPARISON:  10/25/2021. FINDINGS: Brain: No acute intracranial hemorrhage, midline shift or mass effect. Generalized atrophy is noted. Subcortical and periventricular white matter hypodensities are seen bilaterally. There is no hydrocephalus. Vascular: No hyperdense vessel or unexpected calcification. Skull: Normal. Negative for fracture or focal lesion. Sinuses/Orbits: Round density is present in the left maxillary sinus, possible mucosal retention cyst or polyp. There is partial opacification of the ethmoid air cells bilaterally. The orbits are stable. Other: None. IMPRESSION: Stable head CT with no acute intracranial process. Electronically Signed   By: Brett Fairy M.D.   On: 11/18/2021 20:48   CT Chest Wo Contrast  Result Date: 11/18/2021 CLINICAL DATA:  Chest pain, shortness of breath. Altered mental status. Recent pneumonia. Recent COVID positive. EXAM: CT CHEST WITHOUT CONTRAST TECHNIQUE: Multidetector CT imaging of the chest was performed following the standard protocol without IV contrast. COMPARISON:  None. FINDINGS: Cardiovascular: Mild cardiomegaly. Small pericardial effusion is present. Calcified plaque over the left anterior descending and lateral circumflex coronary arteries. No evidence of thoracic aortic aneurysm. Calcified plaque is present over the thoracic aorta. Remaining vascular structures are unremarkable. Mediastinum/Nodes: No definite mediastinal or hilar adenopathy. Remaining mediastinal structures are unremarkable. Lungs/Pleura: Lungs are adequately inflated and demonstrate patchy hazy opacification over the mid to lower lungs bilaterally. Consolidation over the left base likely compressive atelectasis with small to moderate left effusion. Airways are normal. Upper Abdomen: Minimal calcified plaque over the abdominal aorta.  Oval partially calcified rounded mass over the upper pole right kidney slightly smaller compared to 2015. No acute findings. Musculoskeletal: Degenerative change of the spine. No focal abnormality. IMPRESSION: 1. Patchy bilateral hazy airspace process which may be due to infection bacterial or viral origin. Small to moderate left pleural effusion with associated left basilar consolidation likely compressive atelectasis. 2. Mild cardiomegaly with small pericardial effusion. Atherosclerotic coronary artery disease. 3. Aortic atherosclerosis. 4. Oval partially calcified rounded mass over the upper pole right kidney slightly smaller compared to 2015. Aortic Atherosclerosis (ICD10-I70.0). Electronically Signed   By: Marin Olp M.D.   On: 11/18/2021 20:52   DG Chest Port 1 View  Result Date: 11/18/2021 CLINICAL DATA:  Possible sepsis. Altered mental status and shortness of breath. EXAM: PORTABLE CHEST 1 VIEW COMPARISON:  10/25/2021 FINDINGS: Lungs are somewhat hypoinflated with continued hazy opacification over the lung bases left worse than right. Left effusion and atelectasis is likely. Infection in the lung bases is possible. Cardiomediastinal silhouette and remainder of the exam is unchanged. IMPRESSION: Stable bibasilar opacification left worse than right likely effusions with atelectasis. Infection in the lung bases is possible. Electronically Signed   By: Marin Olp M.D.   On: 11/18/2021 19:38    Pending Labs Unresulted Labs (From admission, onward)     Start     Ordered   11/18/21 2125  Ammonia  ONCE - STAT,   STAT        11/18/21 2124   11/18/21 1904  Lactic acid, plasma  (Undifferentiated presentation (screening labs and basic nursing orders))  Now then every 2 hours,   STAT      11/18/21 1904   11/18/21 1904  Blood Culture (routine x 2)  (Undifferentiated presentation (screening labs and basic  nursing orders))  BLOOD CULTURE X 2,   STAT      11/18/21 1904   11/18/21 1904  Urine Culture   (Undifferentiated presentation (screening labs and basic nursing orders))  ONCE - STAT,   STAT       Question:  Indication  Answer:  Sepsis   11/18/21 1904            Vitals/Pain Today's Vitals   11/18/21 1900 11/18/21 1930 11/18/21 2002 11/18/21 2111  BP: (!) 160/82 (!) 158/87 (!) 178/101 (!) 187/94  Pulse: 83 74 75 80  Resp: 12 17 20 20   Temp:      TempSrc:      SpO2: 97% 96% 99% 100%    Isolation Precautions No active isolations  Medications Medications  vancomycin (VANCOREADY) IVPB 1500 mg/300 mL (has no administration in time range)  ceFEPIme (MAXIPIME) 2 g in sodium chloride 0.9 % 100 mL IVPB (has no administration in time range)    Mobility walks with person assist

## 2021-11-18 NOTE — Progress Notes (Signed)
A consult was received from an ED physician for Vacomycin per pharmacy dosing.  The patient's profile has been reviewed for ht/wt/allergies/indication/available labs.    A one time order has been placed for Vancomycin 1500mg  IV.  Cefepime adjusted to 2gm IV x 1 per antibiotic dosing protocol.  Further antibiotics/pharmacy consults should be ordered by admitting physician if indicated.                       Thank you, , PharmD 11/18/2021  9:39 PM

## 2021-11-18 NOTE — ED Triage Notes (Signed)
BIBA Per EMS: pt coming from [place] w/ c/o AMS since this morning; [facility] with [facility] & pneumonia. Lethargic since this morning- usually talkative.   97% RA  Crackles and rhonchi  70s HR  25RR  150/80 Hx dementia

## 2021-11-19 ENCOUNTER — Inpatient Hospital Stay (HOSPITAL_COMMUNITY): Payer: Medicare Other

## 2021-11-19 ENCOUNTER — Observation Stay (HOSPITAL_COMMUNITY): Payer: Medicare Other

## 2021-11-19 ENCOUNTER — Inpatient Hospital Stay (HOSPITAL_COMMUNITY)
Admit: 2021-11-19 | Discharge: 2021-11-19 | Disposition: A | Discharge status: Home / Self Care | Payer: Medicare Other | Attending: Internal Medicine | Admitting: Internal Medicine

## 2021-11-19 ENCOUNTER — Encounter (HOSPITAL_COMMUNITY): Payer: Self-pay | Admitting: Internal Medicine

## 2021-11-19 DIAGNOSIS — Z8701 Personal history of pneumonia (recurrent): Secondary | ICD-10-CM | POA: Diagnosis not present

## 2021-11-19 DIAGNOSIS — D649 Anemia, unspecified: Secondary | ICD-10-CM | POA: Diagnosis present

## 2021-11-19 DIAGNOSIS — G9340 Encephalopathy, unspecified: Secondary | ICD-10-CM | POA: Diagnosis not present

## 2021-11-19 DIAGNOSIS — I3139 Other pericardial effusion (noninflammatory): Secondary | ICD-10-CM

## 2021-11-19 DIAGNOSIS — Z7401 Bed confinement status: Secondary | ICD-10-CM | POA: Diagnosis not present

## 2021-11-19 DIAGNOSIS — Z4659 Encounter for fitting and adjustment of other gastrointestinal appliance and device: Secondary | ICD-10-CM | POA: Diagnosis not present

## 2021-11-19 DIAGNOSIS — L89152 Pressure ulcer of sacral region, stage 2: Secondary | ICD-10-CM | POA: Diagnosis present

## 2021-11-19 DIAGNOSIS — F0390 Unspecified dementia without behavioral disturbance: Secondary | ICD-10-CM | POA: Diagnosis present

## 2021-11-19 DIAGNOSIS — R600 Localized edema: Secondary | ICD-10-CM | POA: Diagnosis present

## 2021-11-19 DIAGNOSIS — J189 Pneumonia, unspecified organism: Secondary | ICD-10-CM | POA: Diagnosis not present

## 2021-11-19 DIAGNOSIS — E785 Hyperlipidemia, unspecified: Secondary | ICD-10-CM | POA: Diagnosis present

## 2021-11-19 DIAGNOSIS — Z8249 Family history of ischemic heart disease and other diseases of the circulatory system: Secondary | ICD-10-CM | POA: Diagnosis not present

## 2021-11-19 DIAGNOSIS — G9341 Metabolic encephalopathy: Secondary | ICD-10-CM | POA: Diagnosis present

## 2021-11-19 DIAGNOSIS — R4182 Altered mental status, unspecified: Secondary | ICD-10-CM | POA: Diagnosis present

## 2021-11-19 DIAGNOSIS — I10 Essential (primary) hypertension: Secondary | ICD-10-CM | POA: Diagnosis present

## 2021-11-19 DIAGNOSIS — J690 Pneumonitis due to inhalation of food and vomit: Secondary | ICD-10-CM | POA: Diagnosis present

## 2021-11-19 DIAGNOSIS — Z8616 Personal history of COVID-19: Secondary | ICD-10-CM | POA: Diagnosis not present

## 2021-11-19 DIAGNOSIS — L89616 Pressure-induced deep tissue damage of right heel: Secondary | ICD-10-CM | POA: Diagnosis present

## 2021-11-19 DIAGNOSIS — F01518 Vascular dementia, unspecified severity, with other behavioral disturbance: Secondary | ICD-10-CM | POA: Diagnosis not present

## 2021-11-19 DIAGNOSIS — L89626 Pressure-induced deep tissue damage of left heel: Secondary | ICD-10-CM | POA: Diagnosis present

## 2021-11-19 DIAGNOSIS — J90 Pleural effusion, not elsewhere classified: Secondary | ICD-10-CM | POA: Diagnosis present

## 2021-11-19 DIAGNOSIS — G40909 Epilepsy, unspecified, not intractable, without status epilepticus: Secondary | ICD-10-CM | POA: Diagnosis present

## 2021-11-19 DIAGNOSIS — M25552 Pain in left hip: Secondary | ICD-10-CM | POA: Diagnosis present

## 2021-11-19 DIAGNOSIS — R627 Adult failure to thrive: Secondary | ICD-10-CM | POA: Diagnosis present

## 2021-11-19 DIAGNOSIS — M7989 Other specified soft tissue disorders: Secondary | ICD-10-CM | POA: Diagnosis not present

## 2021-11-19 DIAGNOSIS — R1313 Dysphagia, pharyngeal phase: Secondary | ICD-10-CM | POA: Diagnosis present

## 2021-11-19 DIAGNOSIS — Z20822 Contact with and (suspected) exposure to covid-19: Secondary | ICD-10-CM | POA: Diagnosis present

## 2021-11-19 DIAGNOSIS — Z9181 History of falling: Secondary | ICD-10-CM | POA: Diagnosis not present

## 2021-11-19 DIAGNOSIS — E162 Hypoglycemia, unspecified: Secondary | ICD-10-CM | POA: Diagnosis not present

## 2021-11-19 DIAGNOSIS — N400 Enlarged prostate without lower urinary tract symptoms: Secondary | ICD-10-CM | POA: Diagnosis present

## 2021-11-19 LAB — GLUCOSE, CAPILLARY
Glucose-Capillary: 77 mg/dL (ref 70–99)
Glucose-Capillary: 88 mg/dL (ref 70–99)

## 2021-11-19 LAB — ECHOCARDIOGRAM COMPLETE
Area-P 1/2: 2.91 cm2
Height: 73 in
P 1/2 time: 690 msec
S' Lateral: 3.1 cm
Weight: 2656.1 oz

## 2021-11-19 LAB — CBC
HCT: 41.8 % (ref 39.0–52.0)
HCT: 42.1 % (ref 39.0–52.0)
Hemoglobin: 12.7 g/dL — ABNORMAL LOW (ref 13.0–17.0)
Hemoglobin: 12.9 g/dL — ABNORMAL LOW (ref 13.0–17.0)
MCH: 26.6 pg (ref 26.0–34.0)
MCH: 27.4 pg (ref 26.0–34.0)
MCHC: 30.2 g/dL (ref 30.0–36.0)
MCHC: 30.9 g/dL (ref 30.0–36.0)
MCV: 88.3 fL (ref 80.0–100.0)
MCV: 88.7 fL (ref 80.0–100.0)
Platelets: 227 10*3/uL (ref 150–400)
Platelets: 254 10*3/uL (ref 150–400)
RBC: 4.71 MIL/uL (ref 4.22–5.81)
RBC: 4.77 MIL/uL (ref 4.22–5.81)
RDW: 14.7 % (ref 11.5–15.5)
RDW: 14.8 % (ref 11.5–15.5)
WBC: 8.6 10*3/uL (ref 4.0–10.5)
WBC: 8.9 10*3/uL (ref 4.0–10.5)
nRBC: 0 % (ref 0.0–0.2)
nRBC: 0 % (ref 0.0–0.2)

## 2021-11-19 LAB — COMPREHENSIVE METABOLIC PANEL
ALT: 15 U/L (ref 0–44)
AST: 22 U/L (ref 15–41)
Albumin: 2.7 g/dL — ABNORMAL LOW (ref 3.5–5.0)
Alkaline Phosphatase: 59 U/L (ref 38–126)
Anion gap: 7 (ref 5–15)
BUN: 17 mg/dL (ref 8–23)
CO2: 29 mmol/L (ref 22–32)
Calcium: 8.9 mg/dL (ref 8.9–10.3)
Chloride: 104 mmol/L (ref 98–111)
Creatinine, Ser: 0.76 mg/dL (ref 0.61–1.24)
GFR, Estimated: >60 mL/min (ref >60)
Glucose, Bld: 103 mg/dL — ABNORMAL HIGH (ref 70–99)
Potassium: 4.2 mmol/L (ref 3.5–5.1)
Sodium: 140 mmol/L (ref 135–145)
Total Bilirubin: 0.3 mg/dL (ref 0.3–1.2)
Total Protein: 6 g/dL — ABNORMAL LOW (ref 6.5–8.1)

## 2021-11-19 LAB — VITAMIN B12: Vitamin B-12: 1408 pg/mL — ABNORMAL HIGH (ref 180–914)

## 2021-11-19 LAB — CREATININE, SERUM
Creatinine, Ser: 0.83 mg/dL (ref 0.61–1.24)
GFR, Estimated: >60 mL/min (ref >60)

## 2021-11-19 LAB — MAGNESIUM: Magnesium: 2 mg/dL (ref 1.7–2.4)

## 2021-11-19 LAB — LACTIC ACID, PLASMA: Lactic Acid, Venous: 1.1 mmol/L (ref 0.5–1.9)

## 2021-11-19 LAB — PROCALCITONIN: Procalcitonin: <0.1 ng/mL

## 2021-11-19 LAB — AMMONIA: Ammonia: 21 umol/L (ref 9–35)

## 2021-11-19 LAB — VALPROIC ACID LEVEL: Valproic Acid Lvl: 24 ug/mL — ABNORMAL LOW (ref 50.0–100.0)

## 2021-11-19 LAB — PHOSPHORUS: Phosphorus: 4 mg/dL (ref 2.5–4.6)

## 2021-11-19 MED ORDER — JEVITY 1.2 CAL PO LIQD
1000.0000 mL | ORAL | Status: DC
  Administered 2021-11-19 – 2021-11-24 (×6): 1000 mL
  Filled 2021-11-19 (×12): qty 1000

## 2021-11-19 MED ORDER — ENSURE ENLIVE PO LIQD: 237.0000 mL | Freq: Two times a day (BID) | ORAL | Status: DC

## 2021-11-19 MED ORDER — COLLAGENASE 250 UNIT/GM EX OINT
TOPICAL_OINTMENT | Freq: Every day | CUTANEOUS | Status: DC
  Filled 2021-11-19: qty 30

## 2021-11-19 MED ORDER — FREE WATER
150.0000 mL | Freq: Four times a day (QID) | Status: DC
  Administered 2021-11-19 – 2021-11-25 (×24): 150 mL

## 2021-11-19 MED ORDER — VALPROIC ACID 250 MG/5ML PO SOLN
500.0000 mg | Freq: Every day | ORAL | Status: DC
  Administered 2021-11-19 – 2021-12-04 (×16): 500 mg
  Filled 2021-11-19 (×17): qty 10

## 2021-11-19 MED ORDER — DONEPEZIL HCL 10 MG PO TABS
10.0000 mg | ORAL_TABLET | Freq: Every day | ORAL | Status: DC
  Administered 2021-11-19 – 2021-12-04 (×16): 10 mg
  Filled 2021-11-19 (×17): qty 1

## 2021-11-19 MED ORDER — DIVALPROEX SODIUM 250 MG PO DR TAB
1000.0000 mg | DELAYED_RELEASE_TABLET | Freq: Every day | ORAL | Status: DC
  Filled 2021-11-19: qty 4

## 2021-11-19 MED ORDER — ZINC OXIDE 20 % EX OINT
TOPICAL_OINTMENT | Freq: Two times a day (BID) | CUTANEOUS | Status: DC
  Filled 2021-11-19 (×5): qty 28.35

## 2021-11-19 MED ORDER — VALPROIC ACID 250 MG/5ML PO SOLN
1000.0000 mg | Freq: Every day | ORAL | Status: DC
  Administered 2021-11-20 – 2021-12-05 (×15): 1000 mg
  Filled 2021-11-19 (×16): qty 20

## 2021-11-19 MED ORDER — CHLORHEXIDINE GLUCONATE 0.12 % MT SOLN
15.0000 mL | Freq: Two times a day (BID) | OROMUCOSAL | Status: DC
  Administered 2021-11-19 – 2021-12-05 (×32): 15 mL via OROMUCOSAL
  Filled 2021-11-19 (×28): qty 15

## 2021-11-19 MED ORDER — ADULT MULTIVITAMIN LIQUID CH
15.0000 mL | Freq: Every day | ORAL | Status: DC
  Administered 2021-11-20 – 2021-12-05 (×15): 15 mL
  Filled 2021-11-19 (×16): qty 15

## 2021-11-19 MED ORDER — PROSOURCE TF PO LIQD
45.0000 mL | Freq: Every day | ORAL | Status: DC
  Administered 2021-11-19 – 2021-12-05 (×16): 45 mL
  Filled 2021-11-19 (×18): qty 45

## 2021-11-19 MED ORDER — ZINC OXIDE 12.8 % EX OINT
TOPICAL_OINTMENT | Freq: Four times a day (QID) | CUTANEOUS | Status: DC
  Filled 2021-11-19: qty 56.7

## 2021-11-19 MED ORDER — RISPERIDONE 0.5 MG PO TABS
0.5000 mg | ORAL_TABLET | Freq: Every day | ORAL | Status: DC
  Administered 2021-11-19 – 2021-11-27 (×9): 0.5 mg
  Filled 2021-11-19 (×10): qty 1

## 2021-11-19 MED ORDER — ACETAMINOPHEN 160 MG/5ML PO SOLN
650.0000 mg | Freq: Four times a day (QID) | ORAL | Status: DC | PRN
  Administered 2021-11-21 – 2021-12-05 (×11): 650 mg
  Filled 2021-11-19 (×11): qty 20.3

## 2021-11-19 MED ORDER — ORAL CARE MOUTH RINSE: 15.0000 mL | Freq: Two times a day (BID) | OROMUCOSAL | Status: DC

## 2021-11-19 MED ORDER — VITAL HIGH PROTEIN PO LIQD: 1000.0000 mL | ORAL | Status: DC

## 2021-11-19 MED ORDER — SODIUM CHLORIDE 0.9 % IV SOLN
3.0000 g | Freq: Four times a day (QID) | INTRAVENOUS | Status: DC
  Administered 2021-11-19 – 2021-11-22 (×11): 3 g via INTRAVENOUS
  Filled 2021-11-19 (×12): qty 8

## 2021-11-19 MED ORDER — MEMANTINE HCL 10 MG PO TABS
10.0000 mg | ORAL_TABLET | Freq: Every day | ORAL | Status: DC
Start: 2021-11-19 — End: 2021-12-05
  Administered 2021-11-19 – 2021-12-04 (×16): 10 mg
  Filled 2021-11-19 (×17): qty 1

## 2021-11-19 MED ORDER — ACETAMINOPHEN 650 MG RE SUPP: 650.0000 mg | Freq: Four times a day (QID) | RECTAL | Status: DC | PRN

## 2021-11-19 MED ORDER — OMEGA-3-ACID ETHYL ESTERS 1 G PO CAPS
1.0000 g | ORAL_CAPSULE | Freq: Every day | ORAL | Status: DC
  Filled 2021-11-19 (×3): qty 1

## 2021-11-19 MED ORDER — ASCORBIC ACID 500 MG PO TABS
250.0000 mg | ORAL_TABLET | Freq: Every day | ORAL | Status: DC
Start: 2021-11-20 — End: 2021-12-05
  Administered 2021-11-20 – 2021-12-05 (×15): 250 mg
  Filled 2021-11-19 (×15): qty 1

## 2021-11-19 MED ORDER — LIP MEDEX EX OINT
TOPICAL_OINTMENT | CUTANEOUS | Status: DC | PRN
  Administered 2021-11-19: 75 via TOPICAL
  Filled 2021-11-19: qty 7

## 2021-11-19 NOTE — Plan of Care (Signed)

## 2021-11-19 NOTE — Progress Notes (Signed)
Pharmacy Antibiotic Note  [name] [name] is a 86 y.o. male recently admitted with COVID PNA admitted on 11/18/2021 with encephalopathy.  Pharmacy has been consulted for Unasyn dosing for possible aspiration PNA.  Plan: Unasyn 3 g iv q 6 hours  Will sign-off and follow remotely.   Thank you for the consult  Height: 6\' 1"  (185.4 cm) Weight: 75.3 kg (166 lb 0.1 oz) (includes heel protectors) IBW/kg (Calculated) : 79.9  Temp (24hrs), Avg:98.4 F (36.9 C), Min:97.7 F (36.5 C), Max:99.3 F (37.4 C)  Recent Labs  Lab 11/18/21 1904 11/18/21 1926 11/19/21 0018 11/19/21 0128  WBC 9.5  --  8.9 8.6  CREATININE  --  0.72 0.83 0.76  LATICACIDVEN 1.7  --  1.1  --     Estimated Creatinine Clearance: 73.2 mL/min (by C-G formula based on SCr of 0.76 mg/dL).    Allergies  Allergen Reactions   Chocolate     Itching if he eats too much chocolate    Ciprofloxacin     Achilles tendon pain    Lipitor [Atorvastatin]     Muscle aches      11/21/21 11/19/2021 4:32 PM

## 2021-11-19 NOTE — Progress Notes (Addendum)
PROGRESS NOTE    [name] [name]  VEL:[id number] DOB: [birth year] DOA: 11/18/2021 PCP: Karna Dupes, MD    No chief complaint on file.   Brief Narrative:   H/o seizure, Advanced dementia nursing home resident was discharged from consistent 2 weeks ago for COVID-pneumonia, found to have lethargy on 11:24 AM, per nursing staff patient is normally talkative, aaox1, will even sing to the snf RN, but he was noticed to have increased crackles and rhonchi required suctioning as well as his DuoNeb, no documented desaturation, no documented fever  Subjective:  He is seen after returned from CT scan, he is weak and lethargic, open eyes briefly to voice, does not appear follow commands, does not talk, has congested cough He is on room air, no documented hypoxia, does not appear in respiratory distress No overt edema, bilateral foot in foot floaters   Assessment & Plan:   Principal Problem:   Acute encephalopathy Active Problems:   Essential hypertension   Dementia (HCC)   Seizure (HCC)   Pressure injury of skin   Recurrent pneumonia  Acute metabolic encephalopathy, Possibly from aspiration pneumonia,  -CT head no acute findings, ammonia level unremarkable, valproic acid level below low therapeutic -will get EEG to rule out seizure -npo, suction prn, Aspiration precaution, speech eval, appear required core track placement during recent hospitalization, discussed with wife, wife agreed to temporary feeding  tube placement - check procalcitonin level -will change oral meds to iv for now, continue aspiration precaution  History of seizure On Depakote and Keppra EEG ordered  Left hip pain Pelvic x-ray concerning for nondisplaced left femur fracture, recommend CT for further evaluation which is ordered, ct did not reveal fracture  Pleural effusions, no fever, no leukocytosis, on room air Monitor, may need thoracentesis pending on goals of care discussion  Pericardial effusion,  appear chronic, blood pressure stable  BPH Oral meds held, placed feeding tube, then resume per tube  FTT, frequent falls in the past when he was at home, he was discharge to snf on 11/11,  per SNF RN" patient was only able to sit on edge of bed, but could not get out of bed.  Has not walked since discharged on 11/11"  Advanced dementia/from nursing home/decubitus ulcers/concerning for ongoing aspiration/wife reports " patient has lost a lot of weight in last few months" Overall poor prognosis, palliative care consult placement  Nutritional Assessment: The patient's BMI is: Body mass index is 21.9 kg/m.Marland Kitchen Seen by dietician.  I agree with the assessment and plan as outlined below: Nutrition Status:        .     Skin Assessment:  I have examined the patient's skin and I agree with the wound assessment as performed by the wound care RN as outlined below:  Pressure Injury 05/08/21 Coccyx Mid Stage 1 -  Intact skin with non-blanchable redness of a localized area usually over a bony prominence. (Active)  05/08/21 0515  Location: Coccyx  Location Orientation: Mid  Staging: Stage 1 -  Intact skin with non-blanchable redness of a localized area usually over a bony prominence.  Wound Description (Comments):   Present on Admission: Yes     Pressure Injury 10/25/21 Heel Left Deep Tissue Pressure Injury - Purple or maroon localized area of discolored intact skin or blood-filled blister due to damage of underlying soft tissue from pressure and/or shear. purple wound on heel (Active)  10/25/21 0800  Location: Heel  Location Orientation: Left  Staging: Deep Tissue Pressure Injury -  Purple or maroon localized area of discolored intact skin or blood-filled blister due to damage of underlying soft tissue from pressure and/or shear.  Wound Description (Comments): purple wound on heel  Present on Admission:      Pressure Injury 10/25/21 Heel Right Deep Tissue Pressure Injury - Purple or maroon  localized area of discolored intact skin or blood-filled blister due to damage of underlying soft tissue from pressure and/or shear. purple wound on heel (Active)  10/25/21 0800  Location: Heel  Location Orientation: Right  Staging: Deep Tissue Pressure Injury - Purple or maroon localized area of discolored intact skin or blood-filled blister due to damage of underlying soft tissue from pressure and/or shear.  Wound Description (Comments): purple wound on heel  Present on Admission:      Pressure Injury 11/18/21 Sacrum (Active)  11/18/21 2357  Location: Sacrum  Location Orientation:   Staging:   Wound Description (Comments):   Present on Admission: Yes    Unresulted Labs (From admission, onward)     Start     Ordered   11/25/21 0500  Creatinine, serum  (enoxaparin (LOVENOX)    CrCl >/= 30 ml/min)  Weekly,   R     Comments: while on enoxaparin therapy    11/18/21 2358   11/20/21 0500  Procalcitonin  Daily,   R      11/19/21 0834   11/19/21 0835  Vitamin B12  Once,   R        11/19/21 0834   11/19/21 0834  Procalcitonin - Baseline  ONCE - STAT,   STAT        11/19/21 0834   11/18/21 2358  Expectorated Sputum Assessment w Gram Stain, Rflx to Resp Cult  (COPD / Pneumonia / Cellulitis / Lower Extremity Wound)  Once,   R        11/18/21 2358   11/18/21 1904  Blood Culture (routine x 2)  (Undifferentiated presentation (screening labs and basic nursing orders))  BLOOD CULTURE X 2,   STAT      11/18/21 1904   11/18/21 1904  Urine Culture  (Undifferentiated presentation (screening labs and basic nursing orders))  ONCE - STAT,   STAT       Question:  Indication  Answer:  Sepsis   11/18/21 1904              DVT prophylaxis: enoxaparin (LOVENOX) injection 40 mg Start: 11/19/21 1000   Code Status:full Family Communication: wife over the phone Disposition:   Status is: inpatient   Dispo: The patient is from: SNF              Anticipated d/c is to: TBD               Anticipated d/c date is: TBD                Consultants:  Palliative care  Procedures:  Feeding tube placement  Antimicrobials:    Anti-infectives (From admission, onward)    Start     Dose/Rate Route Frequency Ordered Stop   11/19/21 0100  cefTRIAXone (ROCEPHIN) 2 g in sodium chloride 0.9 % 100 mL IVPB  Status:  Discontinued        2 g 200 mL/hr over 30 Minutes Intravenous Every 24 hours 11/18/21 2358 11/19/21 1621   11/19/21 0100  azithromycin (ZITHROMAX) 500 mg in sodium chloride 0.9 % 250 mL IVPB  Status:  Discontinued        500 mg 250 mL/hr  over 60 Minutes Intravenous Every 24 hours 11/18/21 2358 11/19/21 1621   11/18/21 2145  vancomycin (VANCOREADY) IVPB 1500 mg/300 mL  Status:  Discontinued        1,500 mg 150 mL/hr over 120 Minutes Intravenous  Once 11/18/21 2137 11/19/21 0023   11/18/21 2145  ceFEPIme (MAXIPIME) 2 g in sodium chloride 0.9 % 100 mL IVPB  Status:  Discontinued        2 g 200 mL/hr over 30 Minutes Intravenous  Once 11/18/21 2138 11/19/21 0007   11/18/21 2130  ceFEPIme (MAXIPIME) 1 g in sodium chloride 0.9 % 100 mL IVPB  Status:  Discontinued        1 g 200 mL/hr over 30 Minutes Intravenous  Once 11/18/21 2125 11/18/21 2138          Objective: Vitals:   11/18/21 2002 11/18/21 2111 11/18/21 2329 11/19/21 0522  BP: (!) 178/101 (!) 187/94 (!) 160/91 131/73  Pulse: 75 80 78 64  Resp: 20 20 20 20   Temp:   98.5 F (36.9 C) 98.4 F (36.9 C)  TempSrc:   Oral Oral  SpO2: 99% 100% 99% 96%  Weight:   75.3 kg   Height:   6\' 1"  (1.854 m)     Intake/Output Summary (Last 24 hours) at 11/19/2021 [id number] Last data filed at 11/19/2021 0600 Gross per 24 hour  Intake 996.19 ml  Output 300 ml  Net 696.19 ml   Filed Weights   11/18/21 2329  Weight: 75.3 kg    Examination:  General exam: Chronic ill-appearing, frail, lethargic, open eyes to voice briefly, congested cough, does not follow command Respiratory system: Positive rhonchi, no wheezing, normal  respiratory effort Cardiovascular system:  RRR.  Gastrointestinal system: Abdomen is nondistended, soft and nontender.  Normal bowel sounds heard. Central nervous system: Lethargic, does not follow command. Extremities: Bilateral feet in Prevalon boots Skin: No rashes, lesions or ulcers Psychiatry: Demented, lethargic, no agitation    Data Reviewed: I have personally reviewed following labs and imaging studies  CBC: Recent Labs  Lab 11/18/21 1904 11/19/21 0018 11/19/21 0128  WBC 9.5 8.9 8.6  NEUTROABS 7.4  --   --   HGB 12.4* 12.7* 12.9*  HCT 40.5 42.1 41.8  MCV 87.7 88.3 88.7  PLT 299 227 0000000    Basic Metabolic Panel: Recent Labs  Lab 11/18/21 1926 11/19/21 0018 11/19/21 0128  NA 141  --  140  K 4.3  --  4.2  CL 103  --  104  CO2 29  --  29  GLUCOSE 114*  --  103*  BUN 17  --  17  CREATININE 0.72 0.83 0.76  CALCIUM 8.9  --  8.9    GFR: Estimated Creatinine Clearance: 73.2 mL/min (by C-G formula based on SCr of 0.76 mg/dL).  Liver Function Tests: Recent Labs  Lab 11/18/21 1926 11/19/21 0128  AST 25 22  ALT 17 15  ALKPHOS 59 59  BILITOT 0.2* 0.3  PROT 6.1* 6.0*  ALBUMIN 2.7* 2.7*    CBG: Recent Labs  Lab 11/18/21 1849  GLUCAP 130*     Recent Results (from the past 240 hour(s))  Resp Panel by RT-PCR (Flu A&B, Covid) Urine, Clean Catch     Status: None   Collection Time: 11/18/21  7:18 PM   Specimen: Urine, Clean Catch; Nasopharyngeal(NP) swabs in vial transport medium  Result Value Ref Range Status   SARS Coronavirus 2 by RT PCR NEGATIVE NEGATIVE Final    Comment: (NOTE)  SARS-CoV-2 target nucleic acids are NOT DETECTED.  The SARS-CoV-2 RNA is generally detectable in upper respiratory specimens during the acute phase of infection. The lowest concentration of SARS-CoV-2 viral copies this assay can detect is 138 copies/mL. A negative result does not preclude SARS-Cov-2 infection and should not be used as the sole basis for treatment or other  patient management decisions. A negative result may occur with  improper specimen collection/handling, submission of specimen other than nasopharyngeal swab, presence of viral mutation(s) within the areas targeted by this assay, and inadequate number of viral copies(<138 copies/mL). A negative result must be combined with clinical observations, patient history, and epidemiological information. The expected result is Negative.  Fact Sheet for Patients:  EntrepreneurPulse.com.au  Fact Sheet for Healthcare Providers:  IncredibleEmployment.be  This test is no t yet approved or cleared by the [place] FDA and  has been authorized for detection and/or diagnosis of SARS-CoV-2 by FDA under an Emergency Use Authorization (EUA). This EUA will remain  in effect (meaning this test can be used) for the duration of the COVID-19 declaration under Section 564(b)(1) of the Act, 21 U.S.C.section 360bbb-3(b)(1), unless the authorization is terminated  or revoked sooner.       Influenza A by PCR NEGATIVE NEGATIVE Final   Influenza B by PCR NEGATIVE NEGATIVE Final    Comment: (NOTE) The Xpert Xpress SARS-CoV-2/FLU/RSV plus assay is intended as an aid in the diagnosis of influenza from Nasopharyngeal swab specimens and should not be used as a sole basis for treatment. Nasal washings and aspirates are unacceptable for Xpert Xpress SARS-CoV-2/FLU/RSV testing.  Fact Sheet for Patients: EntrepreneurPulse.com.au  Fact Sheet for Healthcare Providers: IncredibleEmployment.be  This test is not yet approved or cleared by the [place] FDA and has been authorized for detection and/or diagnosis of SARS-CoV-2 by FDA under an Emergency Use Authorization (EUA). This EUA will remain in effect (meaning this test can be used) for the duration of the COVID-19 declaration under Section 564(b)(1) of the Act, 21 U.S.C. section  360bbb-3(b)(1), unless the authorization is terminated or revoked.  Performed at [facility], [place] [address] [place], [place] [address]          Radiology Studies: DG Pelvis 1-2 Views  Result Date: 11/19/2021 CLINICAL DATA:  Unresponsiveness. EXAM: PELVIS - 1-2 VIEW COMPARISON:  None. FINDINGS: Evaluation is limited on the provided images. Faint linear subcapital lucency through the neck of the left femur concerning for a nondisplaced fracture. Dedicated left hip radiograph or CT is recommended for better evaluation. No dislocation. The bones are osteopenic. Degenerative changes of the lumbar spine. The soft tissues are unremarkable. IMPRESSION: Findings concerning for a nondisplaced left femoral neck fracture. Dedicated left hip radiograph or CT is recommended for better evaluation. Electronically Signed   By: Anner Crete M.D.   On: 11/19/2021 03:41   CT HEAD WO CONTRAST (5MM)  Result Date: 11/18/2021 CLINICAL DATA:  Delirium. EXAM: CT HEAD WITHOUT CONTRAST TECHNIQUE: Contiguous axial images were obtained from the base of the skull through the vertex without intravenous contrast. COMPARISON:  10/25/2021. FINDINGS: Brain: No acute intracranial hemorrhage, midline shift or mass effect. Generalized atrophy is noted. Subcortical and periventricular white matter hypodensities are seen bilaterally. There is no hydrocephalus. Vascular: No hyperdense vessel or unexpected calcification. Skull: Normal. Negative for fracture or focal lesion. Sinuses/Orbits: Round density is present in the left maxillary sinus, possible mucosal retention cyst or polyp. There is partial opacification of the ethmoid air cells bilaterally. The orbits are  stable. Other: None. IMPRESSION: Stable head CT with no acute intracranial process. Electronically Signed   By: Brett Fairy M.D.   On: 11/18/2021 20:48   CT Chest Wo Contrast  Result Date: 11/18/2021 CLINICAL DATA:  Chest pain, shortness of  breath. Altered mental status. Recent pneumonia. Recent COVID positive. EXAM: CT CHEST WITHOUT CONTRAST TECHNIQUE: Multidetector CT imaging of the chest was performed following the standard protocol without IV contrast. COMPARISON:  None. FINDINGS: Cardiovascular: Mild cardiomegaly. Small pericardial effusion is present. Calcified plaque over the left anterior descending and lateral circumflex coronary arteries. No evidence of thoracic aortic aneurysm. Calcified plaque is present over the thoracic aorta. Remaining vascular structures are unremarkable. Mediastinum/Nodes: No definite mediastinal or hilar adenopathy. Remaining mediastinal structures are unremarkable. Lungs/Pleura: Lungs are adequately inflated and demonstrate patchy hazy opacification over the mid to lower lungs bilaterally. Consolidation over the left base likely compressive atelectasis with small to moderate left effusion. Airways are normal. Upper Abdomen: Minimal calcified plaque over the abdominal aorta. Oval partially calcified rounded mass over the upper pole right kidney slightly smaller compared to 2015. No acute findings. Musculoskeletal: Degenerative change of the spine. No focal abnormality. IMPRESSION: 1. Patchy bilateral hazy airspace process which may be due to infection bacterial or viral origin. Small to moderate left pleural effusion with associated left basilar consolidation likely compressive atelectasis. 2. Mild cardiomegaly with small pericardial effusion. Atherosclerotic coronary artery disease. 3. Aortic atherosclerosis. 4. Oval partially calcified rounded mass over the upper pole right kidney slightly smaller compared to 2015. Aortic Atherosclerosis (ICD10-I70.0). Electronically Signed   By: Marin Olp M.D.   On: 11/18/2021 20:52   DG Chest Port 1 View  Result Date: 11/18/2021 CLINICAL DATA:  Possible sepsis. Altered mental status and shortness of breath. EXAM: PORTABLE CHEST 1 VIEW COMPARISON:  10/25/2021 FINDINGS:  Lungs are somewhat hypoinflated with continued hazy opacification over the lung bases left worse than right. Left effusion and atelectasis is likely. Infection in the lung bases is possible. Cardiomediastinal silhouette and remainder of the exam is unchanged. IMPRESSION: Stable bibasilar opacification left worse than right likely effusions with atelectasis. Infection in the lung bases is possible. Electronically Signed   By: Marin Olp M.D.   On: 11/18/2021 19:38        Scheduled Meds:  vitamin C  250 mg Oral Daily   brinzolamide  1 drop Both Eyes TID   chlorhexidine  15 mL Mouth Rinse BID   divalproex  1,000 mg Oral Daily   divalproex  500 mg Oral QHS   donepezil  10 mg Oral QHS   enoxaparin (LOVENOX) injection  40 mg Subcutaneous Q24H   feeding supplement  237 mL Oral BID BM   latanoprost  1 drop Both Eyes QHS   mouth rinse  15 mL Mouth Rinse BID   memantine  10 mg Oral QHS   multivitamin with minerals  1 tablet Oral Daily   Netarsudil Dimesylate  1 drop Both Eyes QHS   omega-3 acid ethyl esters  1 g Oral Daily   risperiDONE  0.5 mg Oral QHS   tamsulosin  0.4 mg Oral QODAY   Continuous Infusions:  sodium chloride 100 mL/hr at 11/19/21 0017   azithromycin 500 mg (11/19/21 0029)   cefTRIAXone (ROCEPHIN)  IV 2 g (11/19/21 0135)   levETIRAcetam 500 mg (11/19/21 0027)     LOS: 0 days   Time spent: 47mins Greater than 50% of this time was spent in counseling, explanation of diagnosis, planning of further  management, and coordination of care.   Voice Recognition Viviann Spare dictation system was used to create this note, attempts have been made to correct errors. Please contact the author with questions and/or clarifications.   Florencia Reasons, MD PhD FACP [facility]  Available via Epic secure chat 7am-7pm for nonurgent issues Please page for urgent issues To page the attending provider between 7A-7P or the covering provider during after hours 7P-7A, please log into the web site  [email or web address] and access using universal [place] password for that web site. If you do not have the password, please call the hospital operator.    11/19/2021, 8:35 AM

## 2021-11-19 NOTE — Progress Notes (Signed)
MD notified the diagnostic view of patient's abdomen has resulted. MD stated patient's NG tube was in the correct spot and placed an "ok to use NG tube" order.

## 2021-11-19 NOTE — Consult Note (Deleted)
WOC Nurse Consult Note: Patient receiving care in [facility] 1423. Patient able to turn self for buttock evaluation. 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  Patient tells me he can sense when he needs to urinate or defecate, but cannot always walk to the bathroom.  For these areas I have ordered apply Triple Paste AFTER washing with soap and water and patting dry--QID.  Unstageable PIs to the posterior heels bilaterally. Right heel wound measures 3.5 cm x 7 cm and is yellow/tan with drainage. Left heel wound measures 2 cm x 2 cm and is also yellow/tan with drainage.  For these wounds: Apply Santyl to bilateral heel wounds in a nickel thick layer. Cover with a saline moistened gauze, then dry gauze.  Change daily.  There are scattered scabbed areas and superficial wounds on the forefeet and toes. For these I have ordered to apply iodine from the swabsticks or swab pads from clean utility and allow to air dry--perform each shift.  There is an old laceration to the right forearm that has heavy drainage and crusting.  For this wound I have ordered the following: Wash the most distal right forearm wound with soap and water, pat dry. Place a size appropriate piece of Xeroform over the wound, secure with kerlix. To remove, saturate with saline or water, then replace the dressing daily.  Pressure Injury POA: Yes/No/NA Measurement: Wound bed: Drainage (amount, consistency, odor)  Periwound: Dressing procedure/placement/frequency: WOC nurse will not follow at this time.  Please re-consult the WOC team if needed.  Helmut Muster, RN, MSN, CWOCN, CNS-BC, pager [phone]

## 2021-11-19 NOTE — Procedures (Signed)
Patient Name: SHIN LAMOUR  MRN: [id number]  Epilepsy Attending: Charlsie Quest  Referring Physician/Provider: Dr Albertine Grates Date: 11/19/2021 Duration: 22.41 mins  Patient history: 86 year old male with altered mental status.  EEG to evaluate for seizure.  Level of alertness: Awake, asleep  AEDs during EEG study: LEV  Technical aspects: This EEG study was done with scalp electrodes positioned according to the 10-20 International system of electrode placement. Electrical activity was acquired at a sampling rate of 500Hz  and reviewed with a high frequency filter of 70Hz  and a low frequency filter of 1Hz . EEG data were recorded continuously and digitally stored.   Description: During awake state, no clear posterior dominant rhythm was seen.  Sleep was characterized by sleep spindles (12 to 14 Hz), maximal frontocentral region.  EEG showed continuous generalized predominantly 5 to 6 Hz theta as well as intermittent generalized 2 to 3 Hz delta slowing. Hyperventilation and photic stimulation were not performed.     ABNORMALITY - Continuous slow, generalized  IMPRESSION: This study is suggestive of moderate diffuse encephalopathy, nonspecific etiology. No seizures or epileptiform discharges were seen throughout the recording.  Cadee Agro 

## 2021-11-19 NOTE — Progress Notes (Signed)
NUTRITION NOTE  Patient discussed with RN and MD via secure chat. ICU Charge RN to place small bore NGT at bedside which will be used for medications and TF.  Will place order for TF for once tube placement has been confirmed: Jevity 1.2 @ 20 ml/hr to advance by 10 ml every 8 hours to reach goal rate of 70 ml/hr with 45 ml Prosource TF once/day and 150 ml free water QID.   At goal rate, this regimen will provide 2056 kcal, 104 grams protein, and 1956 ml free water.    Estimated Nutritional Needs:  Kcal:  2000-2200 kcal Protein:  100-115 grams Fluid:  >/= 2 L/day    Trenton Gammon, MS, RD, LDN, CNSC Inpatient Clinical Dietitian RD pager # available in AMION  After hours/weekend pager # available in [facility]

## 2021-11-19 NOTE — Consult Note (Signed)
WOC Nurse Consult Note: Patient receiving care in [facility]23. Reason for Consult: bilateral heel wounds and buttocks area Wound type: Resolving DTPIs to bilateral posterior heels. Superficial abrasions related to MASD-IAD, friction and shear. Pressure Injury POA: Yes Measurement: right heel wound measures 5 cm x 5 cm is dry and maroon in color. Left heel wound measures 5 cm x 4 cm and is dried and brown. No drainage from either wound  Wound bed: Drainage (amount, consistency, odor)  Periwound: Dressing procedure/placement/frequency: Apply Triple Paste to sacrum AFTER washing with soap and water and patting dry. Can place a foam dressing over the area if needed.  For the heels, iodine and allow to air dry--perform each shift.  Patient has bilateral Prevalon Heel lift boots already in use, continue these.  I will add a standard size bed with air mattress.  Thank you for the consult.  Discussed plan of care with the patient and bedside nurse.  WOC nurse will not follow at this time.  Please re-consult the WOC team if needed.  Helmut Muster, RN, MSN, CWOCN, CNS-BC, pager [phone]

## 2021-11-19 NOTE — Evaluation (Signed)
Clinical/Bedside Swallow Evaluation Patient Details  Name: [name] [name] MRN: [id number] Date of Birth: Apr 01, 1935  Today's Date: 11/19/2021 Time: SLP Start Time (ACUTE ONLY): 1449 SLP Stop Time (ACUTE ONLY): 1510 SLP Time Calculation (min) (ACUTE ONLY): 21 min  Past Medical History:  Past Medical History:  Diagnosis Date   Allergy    Dementia (HCC)    sees Dr. Patrcia Dolly    Diverticulosis of colon (without mention of hemorrhage) 03-01-2004, 04-04-2011   Colonoscopy   ED (erectile dysfunction)    Glaucoma    sees Dr. Arvil Chaco    Hyperglycemia    Hyperlipidemia    Hypertension    Internal hemorrhoids 03-15-1999   Flex Sig    Irritable bowel    Past Surgical History:  Past Surgical History:  Procedure Laterality Date   COLONOSCOPY  04-04-11   per Dr. Jarold Motto, clear, no repeats needed   EYE SURGERY     bilateral cataract extraction per Dr Jettie Pagan   INGUINAL HERNIA REPAIR     KNEE ARTHROSCOPY     right knee   NASAL SINUS SURGERY     HPI:  [name] [name] is a 86 y.o. male with known history of dementia, seizures, BPH, decubitus ulcers, pericardial effusion anemia was recently admitted for pneumonia discharged about 2 weeks ago was brought to the ER after patient was found to be increasingly confused and lethargic. CT chest was showing bilateral infiltrates concerning for pneumonia also was showing mild pericardial effusion. Most recent MBS 10/28/21 recommending Dys 2/honey thick and pt was able to upgrade to Dys 2/nectar prior to discharge.    Assessment / Plan / Recommendation  Clinical Impression  Pt's oropharyngeal swallow, from subjective perspective appears to have declined over past several weeks. Upon arrival pt uright with head in extended position with wet vocal quality, frequent coughng and expectoration of secretions and very weak audible dry swallow. SLP only administered 2 bites of puree in which he continued coughing consistently. He appears significantly  deconditioned with diminished ability to safely swallow po's or secretions. Recommend he continue NPO status, Palliative care for goals of care in regards to overall plan for medical intervention versus comfort. Continue oral care. Therapist will continue for education with pt/family. SLP Visit Diagnosis: Dysphagia, unspecified (R13.10)    Aspiration Risk  Severe aspiration risk    Diet Recommendation NPO        Other  Recommendations Oral Care Recommendations: Oral care QID    Recommendations for follow up therapy are one component of a multi-disciplinary discharge planning process, led by the attending physician.  Recommendations may be updated based on patient status, additional functional criteria and insurance authorization.  Follow up Recommendations Skilled nursing-short term rehab (<3 hours/day)      Assistance Recommended at Discharge    Functional Status Assessment Patient has had a recent decline in their functional status and/or demonstrates limited ability to make significant improvements in function in a reasonable and predictable amount of time  Frequency and Duration min 1 x/week  1 week       Prognosis Prognosis for Safe Diet Advancement: Guarded Barriers to Reach Goals: Severity of deficits;Cognitive deficits      Swallow Study   General Date of Onset: 11/19/21 HPI: [name] [name] is a 86 y.o. male with known history of dementia, seizures, BPH, decubitus ulcers, pericardial effusion anemia was recently admitted for pneumonia discharged about 2 weeks ago was brought to the ER after patient was found to be increasingly confused  and lethargic. CT chest was showing bilateral infiltrates concerning for pneumonia also was showing mild pericardial effusion. Most recent MBS 10/28/21 recommending Dys 2/honey thick and pt was able to upgrade to Dys 2/nectar prior to discharge. Type of Study: Bedside Swallow Evaluation Previous Swallow Assessment: see HPI Diet Prior to this  Study: NPO Temperature Spikes Noted: No Respiratory Status: Room air History of Recent Intubation: No Behavior/Cognition: Alert;Cooperative;Pleasant mood Oral Cavity Assessment: Within Functional Limits Oral Care Completed by SLP: No Vision: Functional for self-feeding Self-Feeding Abilities: Needs assist Patient Positioning: Upright in bed Baseline Vocal Quality: Wet;Low vocal intensity Volitional Cough: Weak    Oral/Motor/Sensory Function Overall Oral Motor/Sensory Function: Generalized oral weakness   Ice Chips Ice chips: Not tested   Thin Liquid Thin Liquid: Not tested    Nectar Thick Nectar Thick Liquid: Not tested   Honey Thick Honey Thick Liquid: Not tested   Puree Puree: Impaired Presentation: Spoon Pharyngeal Phase Impairments: Multiple swallows;Decreased hyoid-laryngeal movement;Cough - Immediate;Cough - Delayed   Solid     Solid: Not tested      Royce Macadamia 11/19/2021,5:11 PM

## 2021-11-19 NOTE — Progress Notes (Signed)
Initial Nutrition Assessment  DOCUMENTATION CODES:   Not applicable  INTERVENTION:  - diet advancement as medically feasible. - may require small bore NGT and TF as patient had Cortrak placed while admitted at [facility] at the end of [place] of this month.  - complete NFPE when feasible.   NUTRITION DIAGNOSIS:   Inadequate oral intake related to inability to eat as evidenced by NPO status.  GOAL:   Patient will meet greater than or equal to 90% of their needs  MONITOR:   Diet advancement, Labs, Weight trends, Skin  REASON FOR ASSESSMENT:   Malnutrition Screening Tool  ASSESSMENT:   86 y.o. male with known history of dementia, seizures, BPH and decubitus ulcers and pericardial effusion anemia was recently admitted for pneumonia discharged about 2 weeks ago was brought to the ER after patient was found to be increasingly confused and lethargic since this morning.  No further history is available.  We will try to reach family to get further history.  Patient out of the room to CT earlier this afternoon. At time of attempted visit he had recently returned and RN and tech were providing care. No visitors were at bedside/in the room at that time. He is noted to be a/o to self only, incomprehensible, and to have memory impairment.   Patient was being followed by RD at [facility] at the end of October and beginning of this month. He had a Cortrak placed on 10/25/21 and was receiving TF during that hospitalization; Cortrak was removed prior to d/c.   Diet advanced from NPO to Dysphagia 2, thin liquids today at 0248 and then changed back to NPO at 0822. No enteral access at this time.   Weight yesterday was 166 lb and weight on 11/05/21 was documented as 173 lb. This would indicate 7 lb weight loss (4% body weight) in the past 2 weeks; significant for time frame.    Labs reviewed. Medications reviewed; 250 mg ascorbic acid/day, 1 g lovaza/day.  IVF; NS @ 100 ml/hr.      NUTRITION - FOCUSED PHYSICAL EXAM:  Unable to complete at this time.   Diet Order:   Diet Order             Diet NPO time specified  Diet effective now                   EDUCATION NEEDS:   No education needs have been identified at this time  Skin:  Skin Assessment: Skin Integrity Issues: Skin Integrity Issues:: DTI, Other (Comment) DTI: bilateral heels Other: skin tears to bilateral pre-tibial area, sacrum, and L thigh  Last BM:  11/25 (type 1 x1, large amount)  Height:   Ht Readings from Last 1 Encounters:  11/18/21 6\' 1"  (1.854 m)    Weight:   Wt Readings from Last 1 Encounters:  11/18/21 75.3 kg    Estimated Nutritional Needs:  Kcal:  2000-2200 kcal Protein:  100-115 grams Fluid:  >/= 2 L/day      11/20/21, MS, RD, LDN, CNSC Inpatient Clinical Dietitian RD pager # available in [facility]  After hours/weekend pager # available in [facility]

## 2021-11-19 NOTE — Evaluation (Signed)
Physical Therapy Evaluation Patient Details Name: [name] [name] MRN: [id number] DOB: 08-17-1935 Today's Date: 11/19/2021  History of Present Illness  86yo male who presented on 11/24 with increased lethargy and confusion. CTH negative for acute processes, flu and covid negative. Of note, had recent hospital admit for PNA. Now admitted with acute encephalopathy. PMH dementia, glaucoma, HLD, HTN, knee arthroscopy  Clinical Impression   Patient received in bed, lethargic but did interact with PT with multimodal cues and stimulation; oriented to self only and tells me that he is in a hotel, his birthday is 09/11/2014. Needed heavy totalA for rolling side to side, sometimes physically resistant and other times not initiating at all. Followed cues less than 30% of the time today. Knees and ankles are very stiff and painful, as was well documented in therapy notes last hospitalization. Left in bed with all needs met, RN in room and attending. Unfortunately I think that [name] [name] is at or very likely close to his baseline level of function- no skilled PT services indicated, signing off for now. Recommend return to long-term care at [facility].        Recommendations for follow up therapy are one component of a multi-disciplinary discharge planning process, led by the attending physician.  Recommendations may be updated based on patient status, additional functional criteria and insurance authorization.  Follow Up Recommendations Long-term institutional care without follow-up therapy    Assistance Recommended at Discharge Frequent or constant Supervision/Assistance  Functional Status Assessment Patient has had a recent decline in their functional status and/or demonstrates limited ability to make significant improvements in function in a reasonable and predictable amount of time  Equipment Recommendations  Hospital bed;Other (comment) (hoyer lift and pads)    Recommendations for Other Services       Precautions  / Restrictions Precautions Precautions: Fall Precaution Comments: catheter, prevalon boots donned in bed for heel wounds      Mobility  Bed Mobility Overal bed mobility: Needs Assistance Bed Mobility: Rolling Rolling: Total assist         General bed mobility comments: needed heavy totalAx1 for rolling to his side, and very physically resistive/not helping to initiate.    Transfers                   General transfer comment: deferred- will need +2 for safety    Ambulation/Gait               General Gait Details: not assessed  Stairs            Wheelchair Mobility    Modified Rankin (Stroke Patients Only)       Balance                                             Pertinent Vitals/Pain Pain Assessment: Faces Faces Pain Scale: Hurts even more Breathing: normal Negative Vocalization: none Facial Expression: smiling or inexpressive Body Language: relaxed Consolability: no need to console PAINAD Score: 0 Pain Location: generalized when attempting mobility Pain Descriptors / Indicators: Grimacing;Moaning Pain Intervention(s): Limited activity within patient's tolerance;Monitored during session    Home Living Family/patient expects to be discharged to:: Skilled nursing facility                   Additional Comments: Pt is a long term resident at [facility]    Prior Function Prior Level  of Function : Needs assist;Patient poor historian/Family not available             Mobility Comments: Facilty provides all needs ADLs Comments: Facility provides assistance with all needs     Hand Dominance   Dominant Hand: Right    Extremity/Trunk Assessment   Upper Extremity Assessment Upper Extremity Assessment: Generalized weakness    Lower Extremity Assessment Lower Extremity Assessment: Generalized weakness RLE Deficits / Details: very stiff and painful knees and ankles, difficult to assess accurate MMT due to  cognition but would suspect no better than 3-/5 at absolute best RLE Coordination: decreased fine motor;decreased gross motor LLE Deficits / Details: very stiff and painful knees and ankles, difficult to assess accurate MMT due to cognition but would suspect no better than 3-/5 at absolute best LLE Coordination: decreased fine motor;decreased gross motor    Cervical / Trunk Assessment Cervical / Trunk Assessment: Kyphotic  Communication   Communication: Expressive difficulties  Cognition Arousal/Alertness: Lethargic Behavior During Therapy: Flat affect Overall Cognitive Status: History of cognitive impairments - at baseline                                 General Comments: oriented to self only, tells me we are in a school and that his birthday is 09/11/2014.        General Comments General comments (skin integrity, edema, etc.): unable to get EOB/OOB to assess balance today    Exercises     Assessment/Plan    PT Assessment All further PT needs can be met in the next venue of care  PT Problem List Decreased strength;Decreased activity tolerance;Decreased balance;Decreased mobility;Decreased cognition;Pain;Decreased safety awareness       PT Treatment Interventions DME instruction;Gait training;Functional mobility training;Therapeutic activities;Balance training;Patient/family education    PT Goals (Current goals can be found in the Care Plan section)  Acute Rehab PT Goals Patient Stated Goal: none stated PT Goal Formulation: With patient Time For Goal Achievement: 12/03/21 Potential to Achieve Goals: Fair    Frequency Other (Comment) (Eval only)   Barriers to discharge        Co-evaluation               AM-PAC PT "6 Clicks" Mobility  Outcome Measure Help needed turning from your back to your side while in a flat bed without using bedrails?: Total Help needed moving from lying on your back to sitting on the side of a flat bed without using  bedrails?: Total Help needed moving to and from a bed to a chair (including a wheelchair)?: Total Help needed standing up from a chair using your arms (e.g., wheelchair or bedside chair)?: Total Help needed to walk in hospital room?: Total Help needed climbing 3-5 steps with a railing? : Total 6 Click Score: 6    End of Session   Activity Tolerance: Patient limited by pain Patient left: with call bell/phone within reach;in bed;with bed alarm set;Other (comment) (RN in room attending) Nurse Communication: Mobility status;Other (comment) (PT signing off) PT Visit Diagnosis: Unsteadiness on feet (R26.81);Muscle weakness (generalized) (M62.81);Pain Pain - Right/Left:  (bilateral) Pain - part of body:  (generalized)    Time: 5638-9373 PT Time Calculation (min) (ACUTE ONLY): 10 min   Charges:   PT Evaluation $PT Eval Moderate Complexity: 1 Mod         Phil Michels U PT, DPT, PN2   Supplemental Physical Therapist [place]    Pager  [phone] Acute Rehab Office [phone]

## 2021-11-19 NOTE — Progress Notes (Signed)
  Echocardiogram 2D Echocardiogram has been performed.  Leta Jungling M 11/19/2021, 11:00 AM

## 2021-11-19 NOTE — Progress Notes (Signed)
Spoke with patient's wife Alvira Philips, and patient's nurse at [facility].  Per Alvira Philips, patient has not walked on his own since his last hospital admission.  Patient had multiple falls at home, often "hit his head."  Nurse at [facility] confirmed that patient had multiple falls during first admission to [facility] (circa May 2022).  Patient was also ambulatory with assistance and devices, able to communicate clearly, and feed himself with cues for swallowing.  During latest stay at [facility] after being discharged from hospital, patient was only able to sit on edge of bed, but could not get out of bed.  Has not walked since last time he was admitted to hospital.  Patient currently alert only to himself.  Frequently calls out, "Alvira Philips," and was able to answer yes and no questions with wife on the phone.  Noted to be singing on occasion.  Bradd Burner, RN

## 2021-11-20 DIAGNOSIS — I10 Essential (primary) hypertension: Secondary | ICD-10-CM

## 2021-11-20 DIAGNOSIS — Z515 Encounter for palliative care: Secondary | ICD-10-CM

## 2021-11-20 DIAGNOSIS — R4182 Altered mental status, unspecified: Secondary | ICD-10-CM | POA: Diagnosis not present

## 2021-11-20 DIAGNOSIS — J189 Pneumonia, unspecified organism: Secondary | ICD-10-CM

## 2021-11-20 DIAGNOSIS — Z7189 Other specified counseling: Secondary | ICD-10-CM

## 2021-11-20 DIAGNOSIS — G9340 Encephalopathy, unspecified: Secondary | ICD-10-CM | POA: Diagnosis not present

## 2021-11-20 DIAGNOSIS — F01518 Vascular dementia, unspecified severity, with other behavioral disturbance: Secondary | ICD-10-CM | POA: Diagnosis not present

## 2021-11-20 LAB — GLUCOSE, CAPILLARY
Glucose-Capillary: 88 mg/dL (ref 70–99)
Glucose-Capillary: 91 mg/dL (ref 70–99)
Glucose-Capillary: 100 mg/dL — ABNORMAL HIGH (ref 70–99)
Glucose-Capillary: 91 mg/dL (ref 70–99)
Glucose-Capillary: 120 mg/dL — ABNORMAL HIGH (ref 70–99)

## 2021-11-20 LAB — BASIC METABOLIC PANEL
Anion gap: 6 (ref 5–15)
BUN: 16 mg/dL (ref 8–23)
CO2: 28 mmol/L (ref 22–32)
Calcium: 8.4 mg/dL — ABNORMAL LOW (ref 8.9–10.3)
Chloride: 106 mmol/L (ref 98–111)
Creatinine, Ser: 0.74 mg/dL (ref 0.61–1.24)
GFR, Estimated: >60 mL/min (ref >60)
Glucose, Bld: 82 mg/dL (ref 70–99)
Potassium: 3.8 mmol/L (ref 3.5–5.1)
Sodium: 140 mmol/L (ref 135–145)

## 2021-11-20 LAB — CBC
HCT: 33.9 % — ABNORMAL LOW (ref 39.0–52.0)
Hemoglobin: 10.5 g/dL — ABNORMAL LOW (ref 13.0–17.0)
MCH: 27.6 pg (ref 26.0–34.0)
MCHC: 31 g/dL (ref 30.0–36.0)
MCV: 89 fL (ref 80.0–100.0)
Platelets: 206 10*3/uL (ref 150–400)
RBC: 3.81 MIL/uL — ABNORMAL LOW (ref 4.22–5.81)
RDW: 15 % (ref 11.5–15.5)
WBC: 4 10*3/uL (ref 4.0–10.5)
nRBC: 0 % (ref 0.0–0.2)

## 2021-11-20 LAB — PHOSPHORUS
Phosphorus: 3.6 mg/dL (ref 2.5–4.6)
Phosphorus: 3.9 mg/dL (ref 2.5–4.6)

## 2021-11-20 LAB — URINE CULTURE: Culture: NO GROWTH

## 2021-11-20 LAB — MAGNESIUM
Magnesium: 1.8 mg/dL (ref 1.7–2.4)
Magnesium: 2 mg/dL (ref 1.7–2.4)

## 2021-11-20 LAB — PROCALCITONIN: Procalcitonin: <0.1 ng/mL

## 2021-11-20 MED ORDER — POLYETHYLENE GLYCOL 3350 17 G PO PACK
17.0000 g | PACK | Freq: Two times a day (BID) | ORAL | Status: DC
  Administered 2021-11-20 – 2021-11-24 (×5): 17 g
  Filled 2021-11-20 (×5): qty 1

## 2021-11-20 NOTE — Plan of Care (Signed)

## 2021-11-20 NOTE — Consult Note (Signed)
Palliative Care Consult Note                                  Date: 11/20/2021   Patient Name: [name]  DOB: 04/13/1935  MRN: [id number]  Age / Sex: 86 y.o., male  PCP: [name] Band, MD Referring Physician: Florencia Reasons, MD  Reason for Consultation: Establishing goals of care  HPI/Patient Profile: Palliative Care consult requested for goals of care discussion in this 86 y.o. male  with past medical history of seizure disorder, BPH, dementia, glaucoma, hyperlipidemia, hypertension, COVID-19 (May 2022), and irritable bowel.  He was admitted on 11/18/2021 from [name] with altered mental status.  Patient was recently admitted for pneumonia 2 weeks prior.  Head CT negative for acute findings.  Chest CT showed bilateral infiltrates concerning for pneumonia with mild pericardial effusion.  Patient now with Cortrak due to ongoing aspiration.   Past Medical History:  Diagnosis Date  . Allergy   . Dementia [facility])    sees Dr. Ellouise [name]   . Diverticulosis of colon (without mention of hemorrhage) 03-01-2004, 04-04-2011   Colonoscopy  . ED (erectile dysfunction)   . Glaucoma    sees Dr. Crecencio [name]   . Hyperglycemia   . Hyperlipidemia   . Hypertension   . Internal hemorrhoids 03-15-1999   Flex Sig   . Irritable bowel      Subjective:   This NP [name] reviewed medical records, received report from team, assessed the patient and then met at the patient's bedside with wife, [name] and son [name] and his wife to discuss diagnosis, prognosis, GOC, EOL wishes disposition and options.  [name] remains somnolent.Cortrak in place with tube feedings running.  Appears comfortable.  Audible congested cough requiring some suctioning to assist with mucus removal.  Patient does not open eyes despite verbal stimuli.  He will respond to his name.  Follows simple commands.  Is able to state his name  appropriately.  Observed attempting to have conversations with his son however does not open eyes during responses.   [name] is familiar to our team and has been seen on previous admissions.  I reviewed the concept of Palliative Care as specialized medical care for people and their families living with serious illness.  It focuses on providing relief from the symptoms and stress of a serious illness.  The goal is to improve quality of life for both the patient and the family. Values and goals of care important to patient and family were attempted to be elicited.  Wife expresses patient has been sleeping more since previous admission.  He was most awake on Thanksgiving and able to have a conversation.  Endorses decreased appetite with concerns of ability to feed himself.  States intermittent wax and wane in his ability to engage in appropriate conversations.  Ms. Renteria shares patient recently required 3-4 person assist at the facility to transition him from wheelchair to the bed.  Acknowledges continued decline.  Patient is a long-term resident at [name].  Family shares they are interested in seeking additional placement with hopes patient does not have to return back to previous facility.  We discussed His current illness and what it means in the larger context of His on-going co-morbidities. Natural disease trajectory and expectations were discussed.  We discussed at length patient's continued aspiration and inability to eat or show interest.  During previous admission  in October 2022 patient also required temporary tube feedings.  Detailed education regarding concerns of progressive dementia, decreased nutrition, and high risk of ongoing aspiration/hospitalizations/recurrent pneumonia.  Family and I discussed options of comfort feeds versus continuing with artificial feedings for a time trial.  Family verbalized understanding expressing wishes to continue with tube feedings.  Son states  they would not want patient to not be able to receive some form of nutrition and if this requires a more permanent option such as PEG tube they would highly consider.  Recommendations against PEG in the setting of advanced age, ongoing aspiration with no meaningful decrease in risks despite artificial feedings emphasizing this could potentially increase risk, and dementia with high risk of dislodgment.  Family verbalized understanding expressing plans to continue ongoing discussions but with hopes of improvement which would not require such decisions.  I discussed the importance of continued conversation with family and their medical providers regarding overall plan of care and treatment options, ensuring.  Are within the context of the patients values and GOCs.  Questions and concerns were addressed. The family was encouraged to call with questions or concerns.  PMT will continue to support holistically as needed.  Life Review: Patient is a current long-term resident at [name].  He and his wife have been married for more than 65 years.  They have 2 children who both reside in [place].   Objective:   Primary Diagnoses: Present on Admission: . Acute encephalopathy . Dementia ([name]) . Essential hypertension . Pressure injury of skin . Acute metabolic encephalopathy   Scheduled Meds: . vitamin C  250 mg Per Tube Daily  . brinzolamide  1 drop Both Eyes TID  . chlorhexidine  15 mL Mouth Rinse BID  . donepezil  10 mg Per Tube QHS  . enoxaparin (LOVENOX) injection  40 mg Subcutaneous Q24H  . feeding supplement (PROSource TF)  45 mL Per Tube Daily  . free water  150 mL Per Tube Q6H  . latanoprost  1 drop Both Eyes QHS  . mouth rinse  15 mL Mouth Rinse BID  . memantine  10 mg Per Tube QHS  . multivitamin  15 mL Per Tube Daily  . Netarsudil Dimesylate  1 drop Both Eyes QHS  . omega-3 acid ethyl esters  1 g Oral Daily  . polyethylene glycol  17 g Per Tube BID  . risperiDONE  0.5 mg Per  Tube QHS  . tamsulosin  0.4 mg Oral QODAY  . valproic acid  1,000 mg Per Tube Daily  . valproic acid  500 mg Per Tube QHS  . zinc oxide   Topical BID    Continuous Infusions: . ampicillin-sulbactam (UNASYN) IV 3 g (11/20/21 1208)  . feeding supplement (JEVITY 1.2 CAL) 30 mL/hr at 11/20/21 0525  . levETIRAcetam 500 mg (11/20/21 1322)    PRN Meds: acetaminophen **OR** acetaminophen, lip balm  Allergies  Allergen Reactions  . Chocolate     Itching if he eats too much chocolate   . Ciprofloxacin     Achilles tendon pain   . Lipitor [Atorvastatin]     Muscle aches     Review of Systems  Unable to perform ROS: Dementia  Somnolent  Physical Exam General: NAD, frail chronically-ill appearing Cardiovascular: regular rate and rhythm Pulmonary: Rhonchi, congested cough Abdomen: soft, nontender, + bowel sounds Extremities: no edema, bilateral Prevalon boots Skin: no rashes, warm and dry, thin Neurological: Somnolent, will not open eyes, will respond to verbal commands, able to state  name only  Vital Signs:  BP (!) 153/83 (BP Location: Left Arm)   Pulse 61   Temp 97.8 F (36.6 C) (Oral)   Resp 16   Ht $R'6\' 1"'PC$  (1.854 m)   Wt 79 kg Comment: per sizewise bed  SpO2 100%   BMI 22.98 kg/m  Pain Scale: PAINAD   Pain Score: Asleep  SpO2: SpO2: 100 % O2 Device:SpO2: 100 % O2 Flow Rate: .   IO: Intake/output summary:  Intake/Output Summary (Last 24 hours) at 11/20/2021 1542 Last data filed at 11/20/2021 1000 Gross per 24 hour  Intake 2137.33 ml  Output 900 ml  Net 1237.33 ml    LBM: Last BM Date: 11/18/21 Baseline Weight: Weight: 75.3 kg (includes heel protectors) Most recent weight: Weight: 79 kg (per sizewise bed)      Palliative Assessment/Data: PPS 20%   Advanced Care Planning:   Primary Decision Maker: NEXT OF KIN  Code Status/Advance Care Planning: Full code  A discussion was had today regarding advanced directives. Concepts specific to code status,  artifical feeding and hydration, continued IV antibiotics and rehospitalization was had.  Wife confirms patient and family wishes are to treat the treatable aggressively despite multiple discussions regarding prognosis. Family states they will make decisions regarding long-term feeding tube if needed but are remaining hopeful.  Ms. Panning states nutrition would be only for a time trial as he would not wish to live the remainder of his life in current state long-term.  I discussed at length patient's current full CODE STATUS with consideration of his current illness and comorbidities.  Recommended DNR/DNI.  Family verbalized understanding however confirms wishes for patient to remain a full code.  They state if heroic measures are taking his comatose or has no chance of a meaningful recovery they would then consider focusing on his comfort.  Openly discussed in the event of an emergent situation patient's chances of survival are very minimal to none given poor prognosis and conditions prior to such events.   Hospice and Palliative Care services outpatient were explained and offered. Patient and family verbalized their understanding and awareness of both palliative and hospice's goals and philosophy of care.  Family is not interested in hospice however does express wishes for ongoing inpatient and outpatient palliative support.  During his previous hospitalization referral made for outpatient palliative however given recent admissions they have been unable to initiate services.   Assessment & Plan:   SUMMARY OF RECOMMENDATIONS   Full Code-as confirmed by family Extensive education provided to family to consider DNR/DNI with consideration of patient's current illness, comorbidities, continued health decline with understanding of poor outcomes/prognosis. Detailed discussion regarding ongoing aspiration, failure to thrive, and inability to support nutritional needs.  Family wishes to continue to allow  every opportunity for patient to thrive with agreement of temporary artificial nutrition.  States they will consider time trial PEG if required but would not want long-term feedings.  Education provided on minimal chance of meaningful recovery in addition to recommendations against PEG in the setting of aspiration and dementia. Continue with current plan of care, family is clear and expressed goals to continue to treat the treatable. Outpatient palliative support at discharge. PMT will continue to support and follow as needed. Please call team line with urgent needs.  Palliative Prophylaxis:  Aspiration, Bowel Regimen, Eye Care, Frequent Pain Assessment, and Palliative Wound Care  Additional Recommendations (Limitations, Scope, Preferences): Continue to treat the treatable, full scope, family remains hopeful for patient to return to baseline  which they acknowledge is bedbound, total assist, and ongoing somnolence/failure to thrive.  Psycho-social/Spiritual:  Desire for further Chaplaincy support: no Additional Recommendations: Education on Hospice and ongoing goals of care discussions  Prognosis:  Poor long-term prognosis in the setting of failure to thrive, ongoing aspiration, bedbound, deconditioned, sacral wound, somnolence, recurrent pneumonia,  Discharge Planning:  Family is hopeful patient can return back to long-term care with outpatient hospice support      Family expressed understanding and was in agreement with this plan.   Time In: 1400 Time Out: 1510 Time Total: 70 min.   Visit consisted of counseling and education dealing with the complex and emotionally intense issues of symptom management and palliative care in the setting of serious and potentially life-threatening illness.Greater than 50%  of this time was spent counseling and coordinating care related to the above assessment and plan.  Signed by:  Alda Lea, AGPCNP-BC Palliative Medicine Team  Phone:  [phone] Pager: [phone] Amion: Bjorn Pippin   Thank you for allowing the Palliative Medicine Team to assist in the care of this patient. Please utilize secure chat with additional questions, if there is no response within 30 minutes please call the above phone number. Palliative Medicine Team providers are available by phone from 7am to 5pm daily and can be reached through the team cell phone.  Should this patient require assistance outside of these hours, please call the patient's attending physician.

## 2021-11-20 NOTE — Progress Notes (Signed)
Patient has alert since 1700. Awake with eyes open. Alert to self. Disorientated otherwise.Forgetful. Calling out for wife. Following commands at this time. Patient stated he is hungry requesting soup and candy. Explained current care being provided, patient has poor understanding.

## 2021-11-20 NOTE — Progress Notes (Signed)
PROGRESS NOTE    [name] [name]  [id number] DOB: 14-Jun-1935 DOA: 11/18/2021 PCP: Raymondo Band, MD    No chief complaint on file.   Brief Narrative:   H/o seizure, Advanced dementia nursing home resident was discharged from [facility] 2 weeks ago for COVID-pneumonia, found to have lethargy on 11:24 AM, per nursing staff patient is normally talkative, aaox1, will even sing to the snf RN, but he was noticed to have increased crackles and rhonchi required suctioning as well as his DuoNeb, no documented desaturation, no documented fever  Subjective:  He remains lethargic, tolerating tube feeds , I did not hear rhonchus or cough today during the encounter  Wife at bedside   Assessment & Plan:   Principal Problem:   Acute encephalopathy Active Problems:   Essential hypertension   Dementia ([place])   Seizure ([place])   Pressure injury of skin   Recurrent pneumonia   Acute metabolic encephalopathy  Acute metabolic encephalopathy, Possibly from aspiration pneumonia,  -CT head no acute findings, ammonia level unremarkable, valproic acid level below low therapeutic -Feeding tube placed on 11/25 -Continue IV antibiotic ,continue aspiration precaution  Right upper extremity edema Will get venous Doppler to rule out right upper extremity DVT Elevate right arm  History of seizure On Depakote and Keppra EEG no seizure spike  Left hip pain Pelvic x-ray concerning for nondisplaced left femur fracture, recommend CT for further evaluation which is ordered, ct did not reveal fracture  Pleural effusions, no fever, no leukocytosis, on room air Monitor, may need thoracentesis pending on goals of care discussion  Pericardial effusion, appear chronic, blood pressure stable  BPH Oral meds held, placed feeding tube, then resume per tube  FTT, frequent falls in the past when he was at home, he was discharge to snf on 11/11,  per SNF RN" patient was only able to sit on edge of bed, but  could not get out of bed.  Has not walked since discharged on 11/11"  Advanced dementia/from nursing home/decubitus ulcers/concerning for ongoing aspiration/wife reports " patient has lost a lot of weight in last few months" Overall poor prognosis, palliative care consult placement  Nutritional Assessment: The patient's BMI is: Body mass index is 22.98 kg/m.Marland Kitchen Seen by dietician.  I agree with the assessment and plan as outlined below: Nutrition Status: Nutrition Problem: Inadequate oral intake Etiology: inability to eat Signs/Symptoms: NPO status Interventions: Refer to RD note for recommendations  .     Skin Assessment:  I have examined the patient's skin and I agree with the wound assessment as performed by the wound care RN as outlined below:  Pressure Injury 05/08/21 Coccyx Mid Stage 1 -  Intact skin with non-blanchable redness of a localized area usually over a bony prominence. (Active)  05/08/21 0515  Location: Coccyx  Location Orientation: Mid  Staging: Stage 1 -  Intact skin with non-blanchable redness of a localized area usually over a bony prominence.  Wound Description (Comments):   Present on Admission: Yes     Pressure Injury 10/25/21 Heel Left Deep Tissue Pressure Injury - Purple or maroon localized area of discolored intact skin or blood-filled blister due to damage of underlying soft tissue from pressure and/or shear. purple wound on heel (Active)  10/25/21 0800  Location: Heel  Location Orientation: Left  Staging: Deep Tissue Pressure Injury - Purple or maroon localized area of discolored intact skin or blood-filled blister due to damage of underlying soft tissue from pressure and/or shear.  Wound Description (Comments):  purple wound on heel  Present on Admission:      Pressure Injury 10/25/21 Heel Right Deep Tissue Pressure Injury - Purple or maroon localized area of discolored intact skin or blood-filled blister due to damage of underlying soft tissue from  pressure and/or shear. purple wound on heel (Active)  10/25/21 0800  Location: Heel  Location Orientation: Right  Staging: Deep Tissue Pressure Injury - Purple or maroon localized area of discolored intact skin or blood-filled blister due to damage of underlying soft tissue from pressure and/or shear.  Wound Description (Comments): purple wound on heel  Present on Admission:      Pressure Injury 11/18/21 Sacrum (Active)  11/18/21 2357  Location: Sacrum  Location Orientation:   Staging:   Wound Description (Comments):   Present on Admission: Yes    Unresulted Labs (From admission, onward)     Start     Ordered   11/25/21 0500  Creatinine, serum  (enoxaparin (LOVENOX)    CrCl >/= 30 ml/min)  Weekly,   R     Comments: while on enoxaparin therapy    11/18/21 2358   11/20/21 0500  Procalcitonin  Daily,   R      11/19/21 0834   11/19/21 1715  Phosphorus  (ICU Tube Feeding: PEPuP )  5A & 5P,   R (with TIMED occurrences)      11/19/21 1714   11/19/21 1715  Magnesium  (ICU Tube Feeding: PEPuP )  5A & 5P,   R (with TIMED occurrences)      11/19/21 1714   11/18/21 2358  Expectorated Sputum Assessment w Gram Stain, Rflx to Resp Cult  (COPD / Pneumonia / Cellulitis / Lower Extremity Wound)  Once,   R        11/18/21 2358              DVT prophylaxis: enoxaparin (LOVENOX) injection 40 mg Start: 11/19/21 1000   Code Status:full Family Communication: wife at bedside Disposition:   Status is: inpatient   Dispo: The patient is from: SNF              Anticipated d/c is to: SNF/long-term care              Anticipated d/c date is: TBD                Consultants:  Palliative care  Procedures:  Feeding tube placement  Antimicrobials:    Anti-infectives (From admission, onward)    Start     Dose/Rate Route Frequency Ordered Stop   11/19/21 1800  Ampicillin-Sulbactam (UNASYN) 3 g in sodium chloride 0.9 % 100 mL IVPB        3 g 200 mL/hr over 30 Minutes Intravenous Every 6  hours 11/19/21 1644     11/19/21 0100  cefTRIAXone (ROCEPHIN) 2 g in sodium chloride 0.9 % 100 mL IVPB  Status:  Discontinued        2 g 200 mL/hr over 30 Minutes Intravenous Every 24 hours 11/18/21 2358 11/19/21 1621   11/19/21 0100  azithromycin (ZITHROMAX) 500 mg in sodium chloride 0.9 % 250 mL IVPB  Status:  Discontinued        500 mg 250 mL/hr over 60 Minutes Intravenous Every 24 hours 11/18/21 2358 11/19/21 1621   11/18/21 2145  vancomycin (VANCOREADY) IVPB 1500 mg/300 mL  Status:  Discontinued        1,500 mg 150 mL/hr over 120 Minutes Intravenous  Once 11/18/21 2137 11/19/21 0023  11/18/21 2145  ceFEPIme (MAXIPIME) 2 g in sodium chloride 0.9 % 100 mL IVPB  Status:  Discontinued        2 g 200 mL/hr over 30 Minutes Intravenous  Once 11/18/21 2138 11/19/21 0007   11/18/21 2130  ceFEPIme (MAXIPIME) 1 g in sodium chloride 0.9 % 100 mL IVPB  Status:  Discontinued        1 g 200 mL/hr over 30 Minutes Intravenous  Once 11/18/21 2125 11/18/21 2138          Objective: Vitals:   11/19/21 0929 11/19/21 1249 11/19/21 2023 11/20/21 0500  BP: 130/69 119/66 (!) 153/83   Pulse: 64 67 61   Resp: 14 16    Temp: 97.7 F (36.5 C) 98.2 F (36.8 C) 97.8 F (36.6 C)   TempSrc: Oral Oral Oral   SpO2: 91% 93% 100%   Weight:    79 kg  Height:        Intake/Output Summary (Last 24 hours) at 11/20/2021 0911 Last data filed at 11/20/2021 0537 Gross per 24 hour  Intake 2687.26 ml  Output 900 ml  Net 1787.26 ml   Filed Weights   11/18/21 2329 11/20/21 0500  Weight: 75.3 kg 79 kg    Examination:  General exam: Chronic ill-appearing, frail, lethargic, open eyes to voice briefly, positive feeding tube  respiratory system: less rhonchi, no wheezing, normal respiratory effort Cardiovascular system:  RRR.  Gastrointestinal system: Abdomen is nondistended, soft and nontender.  Normal bowel sounds heard. Central nervous system: Lethargic, does not follow command. Extremities: Bilateral  feet in Prevalon boots, right arm per extremity edema Skin: No rashes, lesions or ulcers Psychiatry: Demented, lethargic, no agitation    Data Reviewed: I have personally reviewed following labs and imaging studies  CBC: Recent Labs  Lab 11/18/21 1904 11/19/21 0018 11/19/21 0128 11/20/21 0332  WBC 9.5 8.9 8.6 4.0  NEUTROABS 7.4  --   --   --   HGB 12.4* 12.7* 12.9* 10.5*  HCT 40.5 42.1 41.8 33.9*  MCV 87.7 88.3 88.7 89.0  PLT 299 227 254 [id number]    Basic Metabolic Panel: Recent Labs  Lab 11/18/21 1926 11/19/21 0018 11/19/21 0128 11/19/21 1901 11/20/21 0332  NA 141  --  140  --  140  K 4.3  --  4.2  --  3.8  CL 103  --  104  --  106  CO2 29  --  29  --  28  GLUCOSE 114*  --  103*  --  82  BUN 17  --  17  --  16  CREATININE 0.72 0.83 0.76  --  0.74  CALCIUM 8.9  --  8.9  --  8.4*  MG  --   --   --  2.0 1.8  PHOS  --   --   --  4.0 3.6    GFR: Estimated Creatinine Clearance: 76.8 mL/min (by C-G formula based on SCr of 0.74 mg/dL).  Liver Function Tests: Recent Labs  Lab 11/18/21 1926 11/19/21 0128  AST 25 22  ALT 17 15  ALKPHOS 59 59  BILITOT 0.2* 0.3  PROT 6.1* 6.0*  ALBUMIN 2.7* 2.7*    CBG: Recent Labs  Lab 11/18/21 1849 11/19/21 2027 11/19/21 2336 11/20/21 0349 11/20/21 0813  GLUCAP 130* 77 88 88 91     Recent Results (from the past 240 hour(s))  Blood Culture (routine x 2)     Status: None (Preliminary result)   Collection Time: 11/18/21  7:04 PM   Specimen: BLOOD  Result Value Ref Range Status   Specimen Description   Final    BLOOD BLOOD RIGHT HAND Performed at [facility] [address], [place], [place] [address]    Special Requests   Final    BOTTLES DRAWN AEROBIC AND ANAEROBIC Blood Culture results may not be optimal due to an inadequate volume of blood received in culture bottles Performed at [facility] [address] [place], [place] [address]    Culture   Final    NO GROWTH 2  DAYS Performed at [facility] [address] [place], [place] [address]    Report Status PENDING  Incomplete  Urine Culture     Status: None   Collection Time: 11/18/21  7:04 PM   Specimen: In/Out Cath Urine  Result Value Ref Range Status   Specimen Description   Final    IN/OUT CATH URINE Performed at [facility] [address] [place], [place] [address]    Special Requests   Final    NONE Performed at [facility], [place] [address], [place], [place] [address]    Culture   Final    NO GROWTH Performed at [facility], [place] [address] [place], [place] [address]    Report Status 11/20/2021 FINAL  Final  Resp Panel by RT-PCR (Flu A&B, Covid) Urine, Clean Catch     Status: None   Collection Time: 11/18/21  7:18 PM   Specimen: Urine, Clean Catch; Nasopharyngeal(NP) swabs in vial transport medium  Result Value Ref Range Status   SARS Coronavirus 2 by RT PCR NEGATIVE NEGATIVE Final    Comment: (NOTE) SARS-CoV-2 target nucleic acids are NOT DETECTED.  The SARS-CoV-2 RNA is generally detectable in upper respiratory specimens during the acute phase of infection. The lowest concentration of SARS-CoV-2 viral copies this assay can detect is 138 copies/mL. A negative result does not preclude SARS-Cov-2 infection and should not be used as the sole basis for treatment or other patient management decisions. A negative result may occur with  improper specimen collection/handling, submission of specimen other than nasopharyngeal swab, presence of viral mutation(s) within the areas targeted by this assay, and inadequate number of viral copies(<138 copies/mL). A negative result must be combined with clinical observations, patient history, and epidemiological information. The expected result is Negative.  Fact Sheet for Patients:  EntrepreneurPulse.com.au  Fact Sheet for Healthcare Providers:   IncredibleEmployment.be  This test is no t yet approved or cleared by the [place] FDA and  has been authorized for detection and/or diagnosis of SARS-CoV-2 by FDA under an Emergency Use Authorization (EUA). This EUA will remain  in effect (meaning this test can be used) for the duration of the COVID-19 declaration under Section 564(b)(1) of the Act, 21 U.S.C.section 360bbb-3(b)(1), unless the authorization is terminated  or revoked sooner.       Influenza A by PCR NEGATIVE NEGATIVE Final   Influenza B by PCR NEGATIVE NEGATIVE Final    Comment: (NOTE) The Xpert Xpress SARS-CoV-2/FLU/RSV plus assay is intended as an aid in the diagnosis of influenza from Nasopharyngeal swab specimens and should not be used as a sole basis for treatment. Nasal washings and aspirates are unacceptable for Xpert Xpress SARS-CoV-2/FLU/RSV testing.  Fact Sheet for Patients: EntrepreneurPulse.com.au  Fact Sheet for Healthcare Providers: IncredibleEmployment.be  This test is not yet approved or cleared by the [place] and has been authorized for  detection and/or diagnosis of SARS-CoV-2 by FDA under an Emergency Use Authorization (EUA). This EUA will remain in effect (meaning this test can be used) for the duration of the COVID-19 declaration under Section 564(b)(1) of the Act, 21 U.S.C. section 360bbb-3(b)(1), unless the authorization is terminated or revoked.  Performed at [facility], [address] [place], [place] [address]   Blood Culture (routine x 2)     Status: None (Preliminary result)   Collection Time: 11/19/21 12:18 AM   Specimen: BLOOD LEFT ARM  Result Value Ref Range Status   Specimen Description   Final    BLOOD LEFT ARM Performed at [facility], [address], [place], [place] [address]    Special Requests   Final    BOTTLES DRAWN AEROBIC ONLY Blood Culture adequate  volume Performed at [facility], [address] [place], [place] [address]    Culture   Final    NO GROWTH 1 DAY Performed at [facility], [address], [place], [place] [address]    Report Status PENDING  Incomplete         Radiology Studies: DG Pelvis 1-2 Views  Result Date: 11/19/2021 CLINICAL DATA:  Unresponsiveness. EXAM: PELVIS - 1-2 VIEW COMPARISON:  None. FINDINGS: Evaluation is limited on the provided images. Faint linear subcapital lucency through the neck of the left femur concerning for a nondisplaced fracture. Dedicated left hip radiograph or CT is recommended for better evaluation. No dislocation. The bones are osteopenic. Degenerative changes of the lumbar spine. The soft tissues are unremarkable. IMPRESSION: Findings concerning for a nondisplaced left femoral neck fracture. Dedicated left hip radiograph or CT is recommended for better evaluation. Electronically Signed   By: Elgie Collard M.D.   On: 11/19/2021 03:41   CT HEAD WO CONTRAST ( )  Result Date: 11/18/2021 CLINICAL DATA:  Delirium. EXAM: CT HEAD WITHOUT CONTRAST TECHNIQUE: Contiguous axial images were obtained from the base of the skull through the vertex without intravenous contrast. COMPARISON:  10/25/2021. FINDINGS: Brain: No acute intracranial hemorrhage, midline shift or mass effect. Generalized atrophy is noted. Subcortical and periventricular white matter hypodensities are seen bilaterally. There is no hydrocephalus. Vascular: No hyperdense vessel or unexpected calcification. Skull: Normal. Negative for fracture or focal lesion. Sinuses/Orbits: Round density is present in the left maxillary sinus, possible mucosal retention cyst or polyp. There is partial opacification of the ethmoid air cells bilaterally. The orbits are stable. Other: None. IMPRESSION: Stable head CT with no acute intracranial process. Electronically Signed   By: Thornell Sartorius M.D.   On: 11/18/2021 20:48    CT Chest Wo Contrast  Result Date: 11/18/2021 CLINICAL DATA:  Chest pain, shortness of breath. Altered mental status. Recent pneumonia. Recent COVID positive. EXAM: CT CHEST WITHOUT CONTRAST TECHNIQUE: Multidetector CT imaging of the chest was performed following the standard protocol without IV contrast. COMPARISON:  None. FINDINGS: Cardiovascular: Mild cardiomegaly. Small pericardial effusion is present. Calcified plaque over the left anterior descending and lateral circumflex coronary arteries. No evidence of thoracic aortic aneurysm. Calcified plaque is present over the thoracic aorta. Remaining vascular structures are unremarkable. Mediastinum/Nodes: No definite mediastinal or hilar adenopathy. Remaining mediastinal structures are unremarkable. Lungs/Pleura: Lungs are adequately inflated and demonstrate patchy hazy opacification over the mid to lower lungs bilaterally. Consolidation over the left base likely compressive atelectasis with small to moderate left effusion. Airways are normal. Upper Abdomen: Minimal calcified plaque over the abdominal aorta. Oval partially calcified rounded mass over the  upper pole right kidney slightly smaller compared to 2015. No acute findings. Musculoskeletal: Degenerative change of the spine. No focal abnormality. IMPRESSION: 1. Patchy bilateral hazy airspace process which may be due to infection bacterial or viral origin. Small to moderate left pleural effusion with associated left basilar consolidation likely compressive atelectasis. 2. Mild cardiomegaly with small pericardial effusion. Atherosclerotic coronary artery disease. 3. Aortic atherosclerosis. 4. Oval partially calcified rounded mass over the upper pole right kidney slightly smaller compared to 2015. Aortic Atherosclerosis (ICD10-I70.0). Electronically Signed   By: Marin Olp M.D.   On: 11/18/2021 20:52   CT PELVIS WO CONTRAST  Result Date: 11/19/2021 CLINICAL DATA:  Left femur fracture. EXAM: CT  PELVIS WITHOUT CONTRAST TECHNIQUE: Multidetector CT imaging of the pelvis was performed following the standard protocol without intravenous contrast. COMPARISON:  Radiograph of same day. FINDINGS: Urinary Tract:  No abnormality visualized. Bowel: Rectal wall thickening is noted with surrounding inflammatory changes concerning for proctitis. Vascular/Lymphatic: Atherosclerosis of visualized portion of abdominal aorta is noted. No adenopathy is noted. Reproductive:  Mild prostatic enlargement is noted. Other:  No definite ascites or hernia is noted. Musculoskeletal: No definite fracture is noted. IMPRESSION: No definite fracture or other significant bony abnormality is noted. Rectal wall thickening is noted with surrounding inflammatory changes concerning for proctitis. Aortic Atherosclerosis (ICD10-I70.0). Electronically Signed   By: Marijo Conception M.D.   On: 11/19/2021 13:32   CT FEMUR LEFT WO CONTRAST  Result Date: 11/19/2021 CLINICAL DATA:  Suspected femoral fracture EXAM: CT OF THE LOWER LEFT EXTREMITY WITHOUT CONTRAST TECHNIQUE: Multidetector CT imaging of the lower left extremity was performed according to the standard protocol. COMPARISON:  Pelvic x-ray earlier the same day. FINDINGS: Bones/Joint/Cartilage No acute fracture identified, specifically in the subcapital region of the femur which was suspected on plain film. Mild degenerative changes of the hip and moderate degenerative changes of the knee noted. Chondrocalcinosis noted in the knee medial and lateral compartments. Ligaments Suboptimally assessed by CT. Muscles and Tendons Calcific densities in the proximal hamstring tendons suggestive of chronic tendinosis. No obvious significant muscle abnormality identified. Soft tissues Mild subcutaneous fat stranding edema in the thigh. Arterial vascular calcifications noted. IMPRESSION: No acute fracture or dislocation identified. Other chronic findings as described. Electronically Signed   By: Ofilia Neas M.D.   On: 11/19/2021 13:39   DG Chest Port 1 View  Result Date: 11/18/2021 CLINICAL DATA:  Possible sepsis. Altered mental status and shortness of breath. EXAM: PORTABLE CHEST 1 VIEW COMPARISON:  10/25/2021 FINDINGS: Lungs are somewhat hypoinflated with continued hazy opacification over the lung bases left worse than right. Left effusion and atelectasis is likely. Infection in the lung bases is possible. Cardiomediastinal silhouette and remainder of the exam is unchanged. IMPRESSION: Stable bibasilar opacification left worse than right likely effusions with atelectasis. Infection in the lung bases is possible. Electronically Signed   By: Marin Olp M.D.   On: 11/18/2021 19:38   DG Abd Portable 1V  Result Date: 11/19/2021 CLINICAL DATA:  NG-tube. EXAM: PORTABLE ABDOMEN - 1 VIEW COMPARISON:  Abdominal x-ray 10/25/2021. FINDINGS: Enteric tube tip is at the level of the gastric antrum. Bowel-gas pattern is nonobstructive. There are small densities in the right upper quadrant, possibly related to bowel content. No acute fractures are seen. IMPRESSION: Enteric tube tip is at the level of the gastric antrum. Electronically Signed   By: Ronney Asters M.D.   On: 11/19/2021 19:43   EEG adult  Result Date: 11/19/2021 Lora Havens,  MD     11/19/2021 12:57 PM Patient Name: [name] [name] MRN: [id number] Epilepsy Attending: Lora Havens Referring Physician/Provider: Dr Florencia Reasons Date: 11/19/2021 Duration: 22.41 mins Patient history: 86 year old male with altered mental status.  EEG to evaluate for seizure. Level of alertness: Awake, asleep AEDs during EEG study: LEV Technical aspects: This EEG study was done with scalp electrodes positioned according to the 10-20 International system of electrode placement. Electrical activity was acquired at a sampling rate of 500Hz  and reviewed with a high frequency filter of 70Hz  and a low frequency filter of 1Hz . EEG data were recorded continuously and  digitally stored. Description: During awake state, no clear posterior dominant rhythm was seen.  Sleep was characterized by sleep spindles (12 to 14 Hz), maximal frontocentral region.  EEG showed continuous generalized predominantly 5 to 6 Hz theta as well as intermittent generalized 2 to 3 Hz delta slowing. Hyperventilation and photic stimulation were not performed.   ABNORMALITY - Continuous slow, generalized IMPRESSION: This study is suggestive of moderate diffuse encephalopathy, nonspecific etiology. No seizures or epileptiform discharges were seen throughout the recording. Lora Havens   ECHOCARDIOGRAM COMPLETE  Result Date: 11/19/2021    ECHOCARDIOGRAM REPORT   Patient Name:   [name] [name] Date of Exam: 11/19/2021 Medical Rec #:  [id number]        Height:       73.0 in Accession #:    [id number]       Weight:       166.0 lb Date of Birth:  Jul 08, 1935        BSA:          1.988 m Patient Age:    41 years         BP:           130/69 mmHg Patient Gender: M                HR:           67 bpm. Exam Location:  Inpatient Procedure: 2D Echo, Cardiac Doppler and Color Doppler Indications:    Pericardial effusion I31.3  History:        Patient has prior history of Echocardiogram examinations, most                 recent 02/09/2021. Arrythmias:RBBB, Signs/Symptoms:Dementia; Risk                 Factors:Hypertension and Dyslipidemia. Recurrent pneumonia.                 History of pericardial effusion.  Sonographer:    Darlina Sicilian RDCS Referring Phys: [place]  1. Left ventricular ejection fraction, by estimation, is 60 to 65%. The left ventricle has normal function. The left ventricle has no regional wall motion abnormalities. Left ventricular diastolic parameters are consistent with Grade II diastolic dysfunction (pseudonormalization). Elevated left ventricular end-diastolic pressure.  2. Right ventricular systolic function is normal. The right ventricular size is normal.  There is normal pulmonary artery systolic pressure.  3. The pericardial effusion is posterior to the left ventricle and lateral to the left ventricle.  4. The mitral valve is abnormal. Trivial mitral valve regurgitation. No evidence of mitral stenosis.  5. The aortic valve is normal in structure. There is mild calcification of the aortic valve. Aortic valve regurgitation is mild to moderate. Aortic valve sclerosis/calcification is present, without any evidence of aortic stenosis.  6. Aortic dilatation noted. There is mild  dilatation of the aortic root, measuring 39 mm. There is mild dilatation of the ascending aorta, measuring 41 mm.  7. The inferior vena cava is normal in size with greater than 50% respiratory variability, suggesting right atrial pressure of 3 mmHg. FINDINGS  Left Ventricle: Left ventricular ejection fraction, by estimation, is 60 to 65%. The left ventricle has normal function. The left ventricle has no regional wall motion abnormalities. The left ventricular internal cavity size was normal in size. There is  no left ventricular hypertrophy. Left ventricular diastolic parameters are consistent with Grade II diastolic dysfunction (pseudonormalization). Elevated left ventricular end-diastolic pressure. Right Ventricle: The right ventricular size is normal. No increase in right ventricular wall thickness. Right ventricular systolic function is normal. There is normal pulmonary artery systolic pressure. The tricuspid regurgitant velocity is 2.28 m/s, and  with an assumed right atrial pressure of 3 mmHg, the estimated right ventricular systolic pressure is [id number]. Left Atrium: Left atrial size was normal in size. Right Atrium: Right atrial size was normal in size. Pericardium: Trivial pericardial effusion is present. The pericardial effusion is posterior to the left ventricle and lateral to the left ventricle. Mitral Valve: The mitral valve is abnormal. There is mild thickening of the mitral valve  leaflet(s). Trivial mitral valve regurgitation. No evidence of mitral valve stenosis. Tricuspid Valve: The tricuspid valve is normal in structure. Tricuspid valve regurgitation is mild . No evidence of tricuspid stenosis. Aortic Valve: The aortic valve is normal in structure. There is mild calcification of the aortic valve. Aortic valve regurgitation is mild to moderate. Aortic regurgitation PHT measures 690 msec. Aortic valve sclerosis/calcification is present, without any evidence of aortic stenosis. Pulmonic Valve: The pulmonic valve was normal in structure. Pulmonic valve regurgitation is mild. No evidence of pulmonic stenosis. Aorta: The aortic root is normal in size and structure and aortic dilatation noted. There is mild dilatation of the aortic root, measuring 39 mm. There is mild dilatation of the ascending aorta, measuring 41 mm. Venous: The inferior vena cava is normal in size with greater than 50% respiratory variability, suggesting right atrial pressure of 3 mmHg. IAS/Shunts: No atrial level shunt detected by color flow Doppler.  LEFT VENTRICLE PLAX 2D LVIDd:         5.40 cm   Diastology LVIDs:         3.10 cm   LV e' medial:    3.73 cm/s LV PW:         0.80 cm   LV E/e' medial:  18.3 LV IVS:        1.10 cm   LV e' lateral:   3.89 cm/s LVOT diam:     2.30 cm   LV E/e' lateral: 17.5 LV SV:         64 LV SV Index:   32 LVOT Area:     4.15 cm                           3D Volume EF:                          3D EF:        63 %                          LV EDV:       130 ml  LV ESV:       49 ml                          LV SV:        81 ml RIGHT VENTRICLE RV S prime:     12.90 cm/s TAPSE (M-mode): 2.0 cm LEFT ATRIUM             Index        RIGHT ATRIUM           Index LA diam:        2.70 cm 1.36 cm/m   RA Area:     10.30 cm LA Vol (A2C):   33.0 ml 16.60 ml/m  RA Volume:   17.80 ml  8.95 ml/m LA Vol (A4C):   26.7 ml 13.43 ml/m LA Biplane Vol: 30.5 ml 15.34 ml/m  AORTIC VALVE LVOT  Vmax:   82.90 cm/s LVOT Vmean:  58.800 cm/s LVOT VTI:    0.155 m AI PHT:      690 msec  AORTA Ao Root diam: 3.90 cm Ao STJ diam:  3.1 cm Ao Asc diam:  4.05 cm MITRAL VALVE               TRICUSPID VALVE MV Area (PHT): 2.91 cm    TR Peak grad:   20.8 mmHg MV Decel Time: 261 msec    TR Vmax:        228.00 cm/s MV E velocity: 68.10 cm/s MV A velocity: 74.10 cm/s  SHUNTS MV E/A ratio:  0.92        Systemic VTI:  0.16 m                            Systemic Diam: 2.30 cm Jenkins Rouge MD Electronically signed by Jenkins Rouge MD Signature Date/Time: 11/19/2021/11:10:26 AM    Final         Scheduled Meds:  vitamin C  250 mg Per Tube Daily   brinzolamide  1 drop Both Eyes TID   chlorhexidine  15 mL Mouth Rinse BID   donepezil  10 mg Per Tube QHS   enoxaparin (LOVENOX) injection  40 mg Subcutaneous Q24H   feeding supplement (PROSource TF)  45 mL Per Tube Daily   free water  150 mL Per Tube Q6H   latanoprost  1 drop Both Eyes QHS   mouth rinse  15 mL Mouth Rinse BID   memantine  10 mg Per Tube QHS   multivitamin  15 mL Per Tube Daily   Netarsudil Dimesylate  1 drop Both Eyes QHS   omega-3 acid ethyl esters  1 g Oral Daily   risperiDONE  0.5 mg Per Tube QHS   tamsulosin  0.4 mg Oral QODAY   valproic acid  1,000 mg Per Tube Daily   valproic acid  500 mg Per Tube QHS   zinc oxide   Topical BID   Continuous Infusions:  ampicillin-sulbactam (UNASYN) IV 3 g (11/20/21 0535)   feeding supplement (JEVITY 1.2 CAL) 30 mL/hr at 11/20/21 0525   levETIRAcetam 500 mg (11/20/21 0137)     LOS: 1 day   Time spent: 42mins Greater than 50% of this time was spent in counseling, explanation of diagnosis, planning of further management, and coordination of care.   Voice Recognition Viviann Sp[employer] dictation system was used to create this note, attempts have been made to correct errors. Please contact  the author with questions and/or clarifications.   Florencia Reasons, MD PhD FACP [facility]  Available via Epic secure  chat 7am-7pm for nonurgent issues Please page for urgent issues To page the attending provider between 7A-7P or the covering provider during after hours 7P-7A, please log into the web site [email or web address] and access using universal [place] password for that web site. If you do not have the password, please call the hospital operator.    11/20/2021, 9:11 AM

## 2021-11-20 NOTE — Progress Notes (Signed)
Patient pulled out their NG tube. MD notified.

## 2021-11-21 ENCOUNTER — Inpatient Hospital Stay (HOSPITAL_COMMUNITY): Payer: Medicare Other

## 2021-11-21 DIAGNOSIS — M7989 Other specified soft tissue disorders: Secondary | ICD-10-CM

## 2021-11-21 DIAGNOSIS — J189 Pneumonia, unspecified organism: Secondary | ICD-10-CM | POA: Diagnosis not present

## 2021-11-21 LAB — BASIC METABOLIC PANEL
Anion gap: 5 (ref 5–15)
BUN: 13 mg/dL (ref 8–23)
CO2: 30 mmol/L (ref 22–32)
Calcium: 8.6 mg/dL — ABNORMAL LOW (ref 8.9–10.3)
Chloride: 106 mmol/L (ref 98–111)
Creatinine, Ser: 0.69 mg/dL (ref 0.61–1.24)
GFR, Estimated: >60 mL/min (ref >60)
Glucose, Bld: 85 mg/dL (ref 70–99)
Potassium: 3.7 mmol/L (ref 3.5–5.1)
Sodium: 141 mmol/L (ref 135–145)

## 2021-11-21 LAB — PROCALCITONIN: Procalcitonin: <0.1 ng/mL

## 2021-11-21 LAB — GLUCOSE, CAPILLARY
Glucose-Capillary: 104 mg/dL — ABNORMAL HIGH (ref 70–99)
Glucose-Capillary: 108 mg/dL — ABNORMAL HIGH (ref 70–99)
Glucose-Capillary: 116 mg/dL — ABNORMAL HIGH (ref 70–99)
Glucose-Capillary: 124 mg/dL — ABNORMAL HIGH (ref 70–99)
Glucose-Capillary: 152 mg/dL — ABNORMAL HIGH (ref 70–99)
Glucose-Capillary: 192 mg/dL — ABNORMAL HIGH (ref 70–99)
Glucose-Capillary: 39 mg/dL — CL (ref 70–99)
Glucose-Capillary: 77 mg/dL (ref 70–99)

## 2021-11-21 LAB — MAGNESIUM: Magnesium: 2 mg/dL (ref 1.7–2.4)

## 2021-11-21 LAB — PHOSPHORUS: Phosphorus: 3.7 mg/dL (ref 2.5–4.6)

## 2021-11-21 MED ORDER — DEXTROSE 50 % IV SOLN
25.0000 g | INTRAVENOUS | Status: AC
  Administered 2021-11-21: 08:00:00 25 g via INTRAVENOUS

## 2021-11-21 MED ORDER — FUROSEMIDE 10 MG/ML IJ SOLN
40.0000 mg | Freq: Once | INTRAMUSCULAR | Status: AC
  Administered 2021-11-21: 18:00:00 40 mg via INTRAVENOUS
  Filled 2021-11-21: qty 4

## 2021-11-21 MED ORDER — DEXTROSE 50 % IV SOLN
INTRAVENOUS | Status: AC
  Filled 2021-11-21: qty 50

## 2021-11-21 MED ORDER — LEVETIRACETAM 100 MG/ML PO SOLN
500.0000 mg | Freq: Two times a day (BID) | ORAL | Status: DC
Start: 2021-11-21 — End: 2021-12-05
  Administered 2021-11-21 – 2021-12-05 (×28): 500 mg
  Filled 2021-11-21 (×30): qty 5

## 2021-11-21 NOTE — Progress Notes (Addendum)
PROGRESS NOTE    [name] [name]  [id number] DOB: February 05, 1935 DOA: 11/18/2021 PCP: Raymondo Band, MD    No chief complaint on file.   Brief Narrative:   H/o seizure, Advanced dementia nursing home resident was discharged from consistent 2 weeks ago for COVID-pneumonia, found to have lethargy on 11:24 AM, per nursing staff patient is normally talkative, aaox1, will even sing to the snf RN, but he was noticed to have increased crackles and rhonchi required suctioning as well as his DuoNeb, no documented desaturation, no documented fever  Subjective:  He pulled out NG tube last night around midnight, NG replaced restarted tube feeds around 4:00 this morning , had hypoglycemic event this morning   He became more alert yesterday afternoon, now he is communicating, states " I am in [place]"  Cousin at bedside   He is on room air, there is no hypoxia, I do not hear any cough during encounter today  Assessment & Plan:   Principal Problem:   Acute encephalopathy Active Problems:   Essential hypertension   Dementia ([name] [name])   Seizure ([place])   Pressure injury of skin   Recurrent pneumonia   Acute metabolic encephalopathy  Acute metabolic encephalopathy, Possibly from recurrent aspiration pneumonia,  -CT head no acute findings, ammonia level unremarkable, valproic acid level below low therapeutic --Blood culture no growth, urine culture no growth, COVID screening negative -Sputum culture ordered on 11/24, pending collection -Feeding tube placed on 11/25 -Continue IV antibiotic ,continue aspiration precaution  Left Pleural effusion, no fever, no leukocytosis, on room air Monitor, may need thoracentesis pending respiratory status /O2 sats and  goals of care discussion Will give IV Lasix x1, echo showed diastolic dysfunction There are no lower extremity edema, but by report patient has been mainly bedbound for the last 2 weeks  Right upper extremity edema venous Doppler  negative for  DVT Elevate right arm  Pericardial effusion, appear chronic, blood pressure stable  History of seizure On Depakote and Keppra EEG no seizure spike  Left hip pain Pelvic x-ray concerning for nondisplaced left femur fracture, recommend CT for further evaluation which is ordered,  ct did not reveal fracture    BPH Oral meds held, placed feeding tube, then resume per tube  Past covid infection in May 2022 and October 2022, wife is wondering when patient can get COVID booster, in general patient is to get covid booster 28months after covid infection Wife also is requesting patient to have a flu vaccine prior to discharge  FTT, frequent falls in the past when he was at home, he was discharge to snf on 11/11,  per SNF RN" patient was only able to sit on edge of bed, but could not get out of bed.  Has not walked since discharged on 11/11"  Advanced dementia/from nursing home/decubitus ulcers/concerning for ongoing aspiration/wife reports " patient has lost a lot of weight in last few months" Overall poor prognosis, continue to risk of aspiration ,palliative care consult placement  Nutritional Assessment: The patient's BMI is: Body mass index is 22.11 kg/m.Marland Kitchen Seen by dietician.  I agree with the assessment and plan as outlined below: Nutrition Status: Nutrition Problem: Inadequate oral intake Etiology: inability to eat Signs/Symptoms: NPO status Interventions: Refer to RD note for recommendations  .     Skin Assessment:  I have examined the patient's skin and I agree with the wound assessment as performed by the wound care RN as outlined below:  Pressure Injury 05/08/21 Coccyx Mid Stage 1 -  Intact skin with non-blanchable redness of a localized area usually over a bony prominence. (Active)  05/08/21 0515  Location: Coccyx  Location Orientation: Mid  Staging: Stage 1 -  Intact skin with non-blanchable redness of a localized area usually over a bony prominence.   Wound Description (Comments):   Present on Admission: Yes     Pressure Injury 10/25/21 Heel Left Deep Tissue Pressure Injury - Purple or maroon localized area of discolored intact skin or blood-filled blister due to damage of underlying soft tissue from pressure and/or shear. purple wound on heel (Active)  10/25/21 0800  Location: Heel  Location Orientation: Left  Staging: Deep Tissue Pressure Injury - Purple or maroon localized area of discolored intact skin or blood-filled blister due to damage of underlying soft tissue from pressure and/or shear.  Wound Description (Comments): purple wound on heel  Present on Admission:      Pressure Injury 10/25/21 Heel Right Deep Tissue Pressure Injury - Purple or maroon localized area of discolored intact skin or blood-filled blister due to damage of underlying soft tissue from pressure and/or shear. purple wound on heel (Active)  10/25/21 0800  Location: Heel  Location Orientation: Right  Staging: Deep Tissue Pressure Injury - Purple or maroon localized area of discolored intact skin or blood-filled blister due to damage of underlying soft tissue from pressure and/or shear.  Wound Description (Comments): purple wound on heel  Present on Admission:      Pressure Injury 11/18/21 Sacrum (Active)  11/18/21 2357  Location: Sacrum  Location Orientation:   Staging:   Wound Description (Comments):   Present on Admission: Yes    Unresulted Labs (From admission, onward)     Start     Ordered   11/25/21 0500  Creatinine, serum  (enoxaparin (LOVENOX)    CrCl >/= 30 ml/min)  Weekly,   R     Comments: while on enoxaparin therapy    11/18/21 2358   11/22/21 0500  CBC with Differential/Platelet  Tomorrow morning,   R        11/21/21 0758   11/22/21 [id number]  Basic metabolic panel  Tomorrow morning,   R        11/21/21 0758   11/21/21 0000000  Basic metabolic panel  ONCE - STAT,   STAT        11/21/21 0758   11/18/21 2358  Expectorated Sputum Assessment w  Gram Stain, Rflx to Resp Cult  (COPD / Pneumonia / Cellulitis / Lower Extremity Wound)  Once,   R        11/18/21 2358              DVT prophylaxis: enoxaparin (LOVENOX) injection 40 mg Start: 11/19/21 1000   Code Status:full Family Communication: wife at bedside on 11/26 Disposition:   Status is: inpatient   Dispo: The patient is from: SNF              Anticipated d/c is to: SNF/long-term care              Anticipated d/c date is: TBD                Consultants:  Palliative care  Procedures:  Feeding tube placement  Antimicrobials:    Anti-infectives (From admission, onward)    Start     Dose/Rate Route Frequency Ordered Stop   11/19/21 1800  Ampicillin-Sulbactam (UNASYN) 3 g in sodium chloride 0.9 % 100 mL IVPB        3 g  200 mL/hr over 30 Minutes Intravenous Every 6 hours 11/19/21 1644     11/19/21 0100  cefTRIAXone (ROCEPHIN) 2 g in sodium chloride 0.9 % 100 mL IVPB  Status:  Discontinued        2 g 200 mL/hr over 30 Minutes Intravenous Every 24 hours 11/18/21 2358 11/19/21 1621   11/19/21 0100  azithromycin (ZITHROMAX) 500 mg in sodium chloride 0.9 % 250 mL IVPB  Status:  Discontinued        500 mg 250 mL/hr over 60 Minutes Intravenous Every 24 hours 11/18/21 2358 11/19/21 1621   11/18/21 2145  vancomycin (VANCOREADY) IVPB 1500 mg/300 mL  Status:  Discontinued        1,500 mg 150 mL/hr over 120 Minutes Intravenous  Once 11/18/21 2137 11/19/21 0023   11/18/21 2145  ceFEPIme (MAXIPIME) 2 g in sodium chloride 0.9 % 100 mL IVPB  Status:  Discontinued        2 g 200 mL/hr over 30 Minutes Intravenous  Once 11/18/21 2138 11/19/21 0007   11/18/21 2130  ceFEPIme (MAXIPIME) 1 g in sodium chloride 0.9 % 100 mL IVPB  Status:  Discontinued        1 g 200 mL/hr over 30 Minutes Intravenous  Once 11/18/21 2125 11/18/21 2138          Objective: Vitals:   11/20/21 0500 11/20/21 1955 11/21/21 0455 11/21/21 0500  BP:  (!) 165/78 (!) 151/77   Pulse:  (!) 52 (!) 56    Resp:  20 20   Temp:  98.2 F (36.8 C) 98 F (36.7 C)   TempSrc:      SpO2:  100% 98%   Weight: 79 kg   76 kg  Height:        Intake/Output Summary (Last 24 hours) at 11/21/2021 0759 Last data filed at 11/21/2021 [id number] Gross per 24 hour  Intake 1982.67 ml  Output 2475 ml  Net -492.33 ml   Filed Weights   11/18/21 2329 11/20/21 0500 11/21/21 0500  Weight: 75.3 kg 79 kg 76 kg    Examination:  General exam: Chronic ill-appearing, frail, less lethargic , able to answer short questions , start to communicate briefly, + feeding tube  respiratory system: less rhonchi, no wheezing, normal respiratory effort Cardiovascular system:  RRR.  Gastrointestinal system: Abdomen is nondistended, soft and nontender.  Normal bowel sounds heard. Central nervous system: More alert Extremities: Bilateral feet in Prevalon boots, right arm per extremity edema Skin: No rashes, lesions or ulcers Psychiatry: Demented, no agitation    Data Reviewed: I have personally reviewed following labs and imaging studies  CBC: Recent Labs  Lab 11/18/21 1904 11/19/21 0018 11/19/21 0128 11/20/21 0332  WBC 9.5 8.9 8.6 4.0  NEUTROABS 7.4  --   --   --   HGB 12.4* 12.7* 12.9* 10.5*  HCT 40.5 42.1 41.8 33.9*  MCV 87.7 88.3 88.7 89.0  PLT 299 227 254 [id number]    Basic Metabolic Panel: Recent Labs  Lab 11/18/21 1926 11/19/21 0018 11/19/21 0128 11/19/21 1901 11/20/21 0332 11/20/21 1706 11/21/21 0350  NA 141  --  140  --  140  --   --   K 4.3  --  4.2  --  3.8  --   --   CL 103  --  104  --  106  --   --   CO2 29  --  29  --  28  --   --   GLUCOSE 114*  --  103*  --  82  --   --   BUN 17  --  17  --  16  --   --   CREATININE 0.72 0.83 0.76  --  0.74  --   --   CALCIUM 8.9  --  8.9  --  8.4*  --   --   MG  --   --   --  2.0 1.8 2.0 2.0  PHOS  --   --   --  4.0 3.6 3.9 3.7    GFR: Estimated Creatinine Clearance: 73.9 mL/min (by C-G formula based on SCr of 0.74 mg/dL).  Liver Function  Tests: Recent Labs  Lab 11/18/21 1926 11/19/21 0128  AST 25 22  ALT 17 15  ALKPHOS 59 59  BILITOT 0.2* 0.3  PROT 6.1* 6.0*  ALBUMIN 2.7* 2.7*    CBG: Recent Labs  Lab 11/20/21 1708 11/20/21 1958 11/21/21 0048 11/21/21 0451 11/21/21 0739  GLUCAP 91 120* 104* 77 39*     Recent Results (from the past 240 hour(s))  Blood Culture (routine x 2)     Status: None (Preliminary result)   Collection Time: 11/18/21  7:04 PM   Specimen: BLOOD  Result Value Ref Range Status   Specimen Description   Final    BLOOD BLOOD RIGHT HAND Performed at [facility], [address] [place], [place] [address]    Special Requests   Final    BOTTLES DRAWN AEROBIC AND ANAEROBIC Blood Culture results may not be optimal due to an inadequate volume of blood received in culture bottles Performed at [facility], [address], [place], [place] [address]    Culture   Final    NO GROWTH 2 DAYS Performed at [facility], [address], [place], [place] [address]    Report Status PENDING  Incomplete  Urine Culture     Status: None   Collection Time: 11/18/21  7:04 PM   Specimen: In/Out Cath Urine  Result Value Ref Range Status   Specimen Description   Final    IN/OUT CATH URINE Performed at [facility], [address] [place], [place] [address]    Special Requests   Final    NONE Performed at [facility], [address], [place], [place] [address]    Culture   Final    NO GROWTH Performed at [facility], [address], [place], [place] [address]    Report Status 11/20/2021 FINAL  Final  Resp Panel by RT-PCR (Flu A&B, Covid) Urine, Clean Catch     Status: None   Collection Time: 11/18/21  7:18 PM   Specimen: Urine, Clean Catch; Nasopharyngeal(NP) swabs in vial transport medium  Result Value Ref Range Status   SARS Coronavirus 2 by RT PCR NEGATIVE NEGATIVE Final    Comment: (NOTE) SARS-CoV-2  target nucleic acids are NOT DETECTED.  The SARS-CoV-2 RNA is generally detectable in upper respiratory specimens during the acute phase of infection. The lowest concentration of SARS-CoV-2 viral copies this assay can detect is 138 copies/mL. A negative result does not preclude SARS-Cov-2 infection and should not be used as the sole basis for treatment or other patient management decisions. A negative result may occur with  improper specimen collection/handling, submission of specimen other than nasopharyngeal swab, presence of viral mutation(s) within the areas targeted by this assay, and inadequate number of viral copies(<138 copies/mL). A negative result must be combined with  clinical observations, patient history, and epidemiological information. The expected result is Negative.  Fact Sheet for Patients:  BloggerCourse.com  Fact Sheet for Healthcare Providers:  SeriousBroker.it  This test is no t yet approved or cleared by the [place] FDA and  has been authorized for detection and/or diagnosis of SARS-CoV-2 by FDA under an Emergency Use Authorization (EUA). This EUA will remain  in effect (meaning this test can be used) for the duration of the COVID-19 declaration under Section 564(b)(1) of the Act, 21 U.S.C.section 360bbb-3(b)(1), unless the authorization is terminated  or revoked sooner.       Influenza A by PCR NEGATIVE NEGATIVE Final   Influenza B by PCR NEGATIVE NEGATIVE Final    Comment: (NOTE) The Xpert Xpress SARS-CoV-2/FLU/RSV plus assay is intended as an aid in the diagnosis of influenza from Nasopharyngeal swab specimens and should not be used as a sole basis for treatment. Nasal washings and aspirates are unacceptable for Xpert Xpress SARS-CoV-2/FLU/RSV testing.  Fact Sheet for Patients: BloggerCourse.com  Fact Sheet for Healthcare  Providers: SeriousBroker.it  This test is not yet approved or cleared by the [place] FDA and has been authorized for detection and/or diagnosis of SARS-CoV-2 by FDA under an Emergency Use Authorization (EUA). This EUA will remain in effect (meaning this test can be used) for the duration of the COVID-19 declaration under Section 564(b)(1) of the Act, 21 U.S.C. section 360bbb-3(b)(1), unless the authorization is terminated or revoked.  Performed at [facility], [address] [place], [place] [address]   Blood Culture (routine x 2)     Status: None (Preliminary result)   Collection Time: 11/19/21 12:18 AM   Specimen: BLOOD LEFT ARM  Result Value Ref Range Status   Specimen Description   Final    BLOOD LEFT ARM Performed at [facility], [address] [place], [place] [address]    Special Requests   Final    BOTTLES DRAWN AEROBIC ONLY Blood Culture adequate volume Performed at [facility], [address] [place], [place] [address]    Culture   Final    NO GROWTH 1 DAY Performed at [facility], [address], [place], [place] [address]    Report Status PENDING  Incomplete         Radiology Studies: DG Abd 1 View  Result Date: 11/21/2021 CLINICAL DATA:  NG placement. EXAM: ABDOMEN - 1 VIEW COMPARISON:  Earlier radiograph dated 11/19/2021. FINDINGS: Feeding tube with weighted tip in the upper abdomen likely in the proximal stomach. Small left and possibly trace right pleural effusion. There is bibasilar atelectasis or infiltrate. Bilateral hazy and interstitial densities may represent edema. Pneumonia is not excluded. There is cardiomegaly. No acute osseous pathology. IMPRESSION: Feeding tube with weighted tip in the proximal stomach. Electronically Signed   By: Elgie Collard M.D.   On: 11/21/2021 03:57   CT PELVIS WO CONTRAST  Result Date: 11/19/2021 CLINICAL DATA:  Left  femur fracture. EXAM: CT PELVIS WITHOUT CONTRAST TECHNIQUE: Multidetector CT imaging of the pelvis was performed following the standard protocol without intravenous contrast. COMPARISON:  Radiograph of same day. FINDINGS: Urinary Tract:  No abnormality visualized. Bowel: Rectal wall thickening is noted with surrounding inflammatory changes concerning for proctitis. Vascular/Lymphatic: Atherosclerosis of visualized portion of abdominal aorta is noted. No adenopathy is noted. Reproductive:  Mild prostatic enlargement is noted. Other:  No definite ascites or hernia is noted. Musculoskeletal: No definite fracture is noted. IMPRESSION: No definite  fracture or other significant bony abnormality is noted. Rectal wall thickening is noted with surrounding inflammatory changes concerning for proctitis. Aortic Atherosclerosis (ICD10-I70.0). Electronically Signed   By: Marijo Conception M.D.   On: 11/19/2021 13:32   CT FEMUR LEFT WO CONTRAST  Result Date: 11/19/2021 CLINICAL DATA:  Suspected femoral fracture EXAM: CT OF THE LOWER LEFT EXTREMITY WITHOUT CONTRAST TECHNIQUE: Multidetector CT imaging of the lower left extremity was performed according to the standard protocol. COMPARISON:  Pelvic x-ray earlier the same day. FINDINGS: Bones/Joint/Cartilage No acute fracture identified, specifically in the subcapital region of the femur which was suspected on plain film. Mild degenerative changes of the hip and moderate degenerative changes of the knee noted. Chondrocalcinosis noted in the knee medial and lateral compartments. Ligaments Suboptimally assessed by CT. Muscles and Tendons Calcific densities in the proximal hamstring tendons suggestive of chronic tendinosis. No obvious significant muscle abnormality identified. Soft tissues Mild subcutaneous fat stranding edema in the thigh. Arterial vascular calcifications noted. IMPRESSION: No acute fracture or dislocation identified. Other chronic findings as described.  Electronically Signed   By: Ofilia Neas M.D.   On: 11/19/2021 13:39   DG Abd Portable 1V  Result Date: 11/19/2021 CLINICAL DATA:  NG-tube. EXAM: PORTABLE ABDOMEN - 1 VIEW COMPARISON:  Abdominal x-ray 10/25/2021. FINDINGS: Enteric tube tip is at the level of the gastric antrum. Bowel-gas pattern is nonobstructive. There are small densities in the right upper quadrant, possibly related to bowel content. No acute fractures are seen. IMPRESSION: Enteric tube tip is at the level of the gastric antrum. Electronically Signed   By: Ronney Asters M.D.   On: 11/19/2021 19:43   EEG adult  Result Date: 11/19/2021 Lora Havens, MD     11/19/2021 12:57 PM Patient Name: DORMAN CLINKSCALES MRN: [id number] Epilepsy Attending: Lora Havens Referring Physician/Provider: Dr Florencia Reasons Date: 11/19/2021 Duration: 22.41 mins Patient history: 86 year old male with altered mental status.  EEG to evaluate for seizure. Level of alertness: Awake, asleep AEDs during EEG study: LEV Technical aspects: This EEG study was done with scalp electrodes positioned according to the 10-20 International system of electrode placement. Electrical activity was acquired at a sampling rate of 500Hz  and reviewed with a high frequency filter of 70Hz  and a low frequency filter of 1Hz . EEG data were recorded continuously and digitally stored. Description: During awake state, no clear posterior dominant rhythm was seen.  Sleep was characterized by sleep spindles (12 to 14 Hz), maximal frontocentral region.  EEG showed continuous generalized predominantly 5 to 6 Hz theta as well as intermittent generalized 2 to 3 Hz delta slowing. Hyperventilation and photic stimulation were not performed.   ABNORMALITY - Continuous slow, generalized IMPRESSION: This study is suggestive of moderate diffuse encephalopathy, nonspecific etiology. No seizures or epileptiform discharges were seen throughout the recording. Lora Havens   ECHOCARDIOGRAM  COMPLETE  Result Date: 11/19/2021    ECHOCARDIOGRAM REPORT   Patient Name:   RANDOM TOUSANT Date of Exam: 11/19/2021 Medical Rec #:  [id number]        Height:       73.0 in Accession #:    [id number]       Weight:       166.0 lb Date of Birth:  1935/03/15        BSA:          1.988 m Patient Age:    54 years         BP:  130/69 mmHg Patient Gender: M                HR:           67 bpm. Exam Location:  Inpatient Procedure: 2D Echo, Cardiac Doppler and Color Doppler Indications:    Pericardial effusion I31.3  History:        Patient has prior history of Echocardiogram examinations, most                 recent 02/09/2021. Arrythmias:RBBB, Signs/Symptoms:Dementia; Risk                 Factors:Hypertension and Dyslipidemia. Recurrent pneumonia.                 History of pericardial effusion.  Sonographer:    Darlina Sicilian RDCS Referring Phys: [place]  1. Left ventricular ejection fraction, by estimation, is 60 to 65%. The left ventricle has normal function. The left ventricle has no regional wall motion abnormalities. Left ventricular diastolic parameters are consistent with Grade II diastolic dysfunction (pseudonormalization). Elevated left ventricular end-diastolic pressure.  2. Right ventricular systolic function is normal. The right ventricular size is normal. There is normal pulmonary artery systolic pressure.  3. The pericardial effusion is posterior to the left ventricle and lateral to the left ventricle.  4. The mitral valve is abnormal. Trivial mitral valve regurgitation. No evidence of mitral stenosis.  5. The aortic valve is normal in structure. There is mild calcification of the aortic valve. Aortic valve regurgitation is mild to moderate. Aortic valve sclerosis/calcification is present, without any evidence of aortic stenosis.  6. Aortic dilatation noted. There is mild dilatation of the aortic root, measuring 39 mm. There is mild dilatation of the ascending aorta,  measuring 41 mm.  7. The inferior vena cava is normal in size with greater than 50% respiratory variability, suggesting right atrial pressure of 3 mmHg. FINDINGS  Left Ventricle: Left ventricular ejection fraction, by estimation, is 60 to 65%. The left ventricle has normal function. The left ventricle has no regional wall motion abnormalities. The left ventricular internal cavity size was normal in size. There is  no left ventricular hypertrophy. Left ventricular diastolic parameters are consistent with Grade II diastolic dysfunction (pseudonormalization). Elevated left ventricular end-diastolic pressure. Right Ventricle: The right ventricular size is normal. No increase in right ventricular wall thickness. Right ventricular systolic function is normal. There is normal pulmonary artery systolic pressure. The tricuspid regurgitant velocity is 2.28 m/s, and  with an assumed right atrial pressure of 3 mmHg, the estimated right ventricular systolic pressure is [id number]. Left Atrium: Left atrial size was normal in size. Right Atrium: Right atrial size was normal in size. Pericardium: Trivial pericardial effusion is present. The pericardial effusion is posterior to the left ventricle and lateral to the left ventricle. Mitral Valve: The mitral valve is abnormal. There is mild thickening of the mitral valve leaflet(s). Trivial mitral valve regurgitation. No evidence of mitral valve stenosis. Tricuspid Valve: The tricuspid valve is normal in structure. Tricuspid valve regurgitation is mild . No evidence of tricuspid stenosis. Aortic Valve: The aortic valve is normal in structure. There is mild calcification of the aortic valve. Aortic valve regurgitation is mild to moderate. Aortic regurgitation PHT measures 690 msec. Aortic valve sclerosis/calcification is present, without any evidence of aortic stenosis. Pulmonic Valve: The pulmonic valve was normal in structure. Pulmonic valve regurgitation is mild. No evidence of  pulmonic stenosis. Aorta: The aortic root is normal  in size and structure and aortic dilatation noted. There is mild dilatation of the aortic root, measuring 39 mm. There is mild dilatation of the ascending aorta, measuring 41 mm. Venous: The inferior vena cava is normal in size with greater than 50% respiratory variability, suggesting right atrial pressure of 3 mmHg. IAS/Shunts: No atrial level shunt detected by color flow Doppler.  LEFT VENTRICLE PLAX 2D LVIDd:         5.40 cm   Diastology LVIDs:         3.10 cm   LV e' medial:    3.73 cm/s LV PW:         0.80 cm   LV E/e' medial:  18.3 LV IVS:        1.10 cm   LV e' lateral:   3.89 cm/s LVOT diam:     2.30 cm   LV E/e' lateral: 17.5 LV SV:         64 LV SV Index:   32 LVOT Area:     4.15 cm                           3D Volume EF:                          3D EF:        63 %                          LV EDV:       130 ml                          LV ESV:       49 ml                          LV SV:        81 ml RIGHT VENTRICLE RV S prime:     12.90 cm/s TAPSE (M-mode): 2.0 cm LEFT ATRIUM             Index        RIGHT ATRIUM           Index LA diam:        2.70 cm 1.36 cm/m   RA Area:     10.30 cm LA Vol (A2C):   33.0 ml 16.60 ml/m  RA Volume:   17.80 ml  8.95 ml/m LA Vol (A4C):   26.7 ml 13.43 ml/m LA Biplane Vol: 30.5 ml 15.34 ml/m  AORTIC VALVE LVOT Vmax:   82.90 cm/s LVOT Vmean:  58.800 cm/s LVOT VTI:    0.155 m AI PHT:      690 msec  AORTA Ao Root diam: 3.90 cm Ao STJ diam:  3.1 cm Ao Asc diam:  4.05 cm MITRAL VALVE               TRICUSPID VALVE MV Area (PHT): 2.91 cm    TR Peak grad:   20.8 mmHg MV Decel Time: 261 msec    TR Vmax:        228.00 cm/s MV E velocity: 68.10 cm/s MV A velocity: 74.10 cm/s  SHUNTS MV E/A ratio:  0.92        Systemic VTI:  0.16 m  Systemic Diam: 2.30 cm Jenkins Rouge MD Electronically signed by Jenkins Rouge MD Signature Date/Time: 11/19/2021/11:10:26 AM    Final         Scheduled Meds:  vitamin C   250 mg Per Tube Daily   brinzolamide  1 drop Both Eyes TID   chlorhexidine  15 mL Mouth Rinse BID   donepezil  10 mg Per Tube QHS   enoxaparin (LOVENOX) injection  40 mg Subcutaneous Q24H   feeding supplement (PROSource TF)  45 mL Per Tube Daily   free water  150 mL Per Tube Q6H   latanoprost  1 drop Both Eyes QHS   mouth rinse  15 mL Mouth Rinse BID   memantine  10 mg Per Tube QHS   multivitamin  15 mL Per Tube Daily   Netarsudil Dimesylate  1 drop Both Eyes QHS   omega-3 acid ethyl esters  1 g Oral Daily   polyethylene glycol  17 g Per Tube BID   risperiDONE  0.5 mg Per Tube QHS   tamsulosin  0.4 mg Oral QODAY   valproic acid  1,000 mg Per Tube Daily   valproic acid  500 mg Per Tube QHS   zinc oxide   Topical BID   Continuous Infusions:  ampicillin-sulbactam (UNASYN) IV 3 g (11/21/21 0518)   feeding supplement (JEVITY 1.2 CAL) 50 mL/hr at 11/21/21 0412   levETIRAcetam 500 mg (11/21/21 0041)     LOS: 2 days   Time spent: 79mins Greater than 50% of this time was spent in counseling, explanation of diagnosis, planning of further management, and coordination of care.   Voice Recognition Viviann Spare dictation system was used to create this note, attempts have been made to correct errors. Please contact the author with questions and/or clarifications.   Florencia Reasons, MD PhD FACP [facility]  Available via Epic secure chat 7am-7pm for nonurgent issues Please page for urgent issues To page the attending provider between 7A-7P or the covering provider during after hours 7P-7A, please log into the web site [email or web address] and access using universal [facility] password for that web site. If you do not have the password, please call the hospital operator.    11/21/2021, 7:59 AM

## 2021-11-21 NOTE — Progress Notes (Signed)
NG tube placed at 0241 and MD verified it was ok to use at 0406 after confirmation of placement.

## 2021-11-21 NOTE — TOC Initial Note (Signed)
Transition of Care [facility]) - Initial/Assessment Note    Patient Details  Name: [name] [name] MRN: [id number] Date of Birth: 11/15/1935  [facility]) CM/SW Contact:    Darleene Cleaver, LCSW Phone Number: 11/21/2021, 5:17 PM  Clinical Narrative:                  Patient is a LTC resident at [place].  He was just recently discharged from the hospital.  Patient has been approved for LTC Medicaid, CSW to coordinate with SNF on discharge plan to have patient return once he is medically ready for discharge.  CSW to continue to follow patient's progress throughout discharge planning, palliative to follow outpatient at [facility].  Expected Discharge Plan: Skilled Nursing Facility Barriers to Discharge: Continued Medical Work up   Patient Goals and CMS Choice Patient states their goals for this hospitalization and ongoing recovery are:: To return back to SNF      Expected Discharge Plan and Services Expected Discharge Plan: Skilled Nursing Facility                                              Prior Living Arrangements/Services   Lives with:: Facility Resident Patient language and need for interpreter reviewed:: Yes Do you feel safe going back to the place where you live?: Yes      Need for Family Participation in Patient Care: Yes (Comment) Care giver support system in place?: Yes (comment)   Criminal Activity/Legal Involvement Pertinent to Current Situation/Hospitalization: No - Comment as needed  Activities of Daily Living Home Assistive Devices/Equipment: None ADL Screening (condition at time of admission) Patient's cognitive ability adequate to safely complete daily activities?: No Is the patient deaf or have difficulty hearing?: No Does the patient have difficulty seeing, even when wearing glasses/contacts?: No Does the patient have difficulty concentrating, remembering, or making decisions?: Yes Patient able to express need for assistance with ADLs?:  No Does the patient have difficulty dressing or bathing?: Yes Independently performs ADLs?: No Communication: Independent Dressing (OT): Dependent Is this a change from baseline?: Pre-admission baseline Grooming: Dependent Is this a change from baseline?: Pre-admission baseline Feeding: Needs assistance, Dependent Is this a change from baseline?: Pre-admission baseline Bathing: Dependent Is this a change from baseline?: Pre-admission baseline Toileting: Dependent Is this a change from baseline?: Pre-admission baseline In/Out Bed: Needs assistance, Dependent Is this a change from baseline?: Pre-admission baseline Walks in Home: Dependent, Needs assistance Is this a change from baseline?: Pre-admission baseline Does the patient have difficulty walking or climbing stairs?: Yes Weakness of Legs: Both Weakness of Arms/Hands: Both  Permission Sought/Granted Permission sought to share information with : Facility Medical sales representative, Family Supports Permission granted to share information with : Yes, Release of Information Signed  Share Information with NAME: Mcgeachy,[name] Spouse [phone]  [phone]  Rashaud, Ybarbo   [phone]  Baiz,Ronnie Brother [phone]  [phone]  Shaw,Bonnita Daughter   [phone]  Permission granted to share info w AGENCY: SNF admissions        Emotional Assessment Appearance:: Appears stated age   Affect (typically observed): Accepting, Adaptable, Appropriate, Calm Orientation: : Oriented to Self Alcohol / Substance Use: Illicit Drugs, Not Applicable Psych Involvement: No (comment)  Admission diagnosis:  Recurrent pneumonia [J18.9] Acute encephalopathy [G93.40] Altered mental status, unspecified altered mental status type [R41.82] Acute metabolic encephalopathy [G93.41] Patient Active Problem List  Diagnosis Date Noted   Acute metabolic encephalopathy 11/19/2021   Acute encephalopathy 11/18/2021   Recurrent  pneumonia 10/20/2021   Pressure injury of skin 05/11/2021   Pericardial effusion, acute    COVID    Palliative care by specialist    Goals of care, counseling/discussion    General weakness    Syncope 05/07/2021   Seizure (HCC) 03/09/2021   Dementia (HCC) 02/25/2021   BPH with urinary obstruction 12/22/2020   Ankle edema, bilateral 07/13/2020   Hallucinations 02/09/2020   Abdominal pain, acute 08/21/2014   GI bleeding 05/26/2012   Renal mass 05/26/2012   Diverticulosis of colon with hemorrhage 05/26/2012   SHOULDER PAIN, BILATERAL 07/30/2010   HERPES SIMPLEX INFECTION 10/10/2007   TACHYCARDIA, PAROXYSMAL NOS 10/10/2007   Dyslipidemia 07/11/2007   Essential hypertension 07/11/2007   ALLERGIC RHINITIS 07/11/2007   PCP:  Karna Dupes, MD Pharmacy:   [facility] - [place], [place] - [address] [place] [address] Phone: [phone] Fax: [phone]     Social Determinants of Health (SDOH) Interventions    Readmission Risk Interventions Readmission Risk Prevention Plan 10/22/2021  Transportation Screening Complete  Medication Review (RN Care Manager) Referral to Pharmacy  PCP or Specialist appointment within 3-5 days of discharge Complete  HRI or Home Care Consult Not Complete  HRI or Home Care Consult Pt Refusal Comments from SNF  SW Recovery Care/Counseling Consult Complete  Palliative Care Screening Not Applicable  Skilled Nursing Facility Complete  Some recent data might be hidden

## 2021-11-21 NOTE — Progress Notes (Incomplete)
-   BM x2 (type 6/7, brown, large) -

## 2021-11-21 NOTE — Progress Notes (Addendum)
Jevity flow rate increased to 60 mL/her per order.  Wife Alvira Philips, son Josie Saunders, and daughter-in-law Diane at bedside.  Patient saw and appeared to recognize son, saying, "Hey boy!"  Patient arousable but lethargic.  Spent most of morning sleeping.  Providing one- and two-word answers to questions, sometimes providing appropriate responses to questions.  Lung sounds clear and diminished.  Cough strong and congested, less frequent than observed by this RN on previous shift.  Bradd Burner, RN

## 2021-11-21 NOTE — Progress Notes (Signed)
RUE venous duplex has been completed.  Results can be found under chart review under CV PROC. 11/21/2021 1:20 PM Apple Dearmas RVT, RDMS

## 2021-11-21 NOTE — NC FL2 (Signed)
[employer] MEDICAID FL2 LEVEL OF CARE SCREENING TOOL     IDENTIFICATION  Patient Name: [name] [name] Birthdate: [birth year] Sex: male Admission Date (Current Location): 11/18/2021  [facility] and [place] Number:  Producer, television/film/video and Address:  [facility],  [address] [place], [place] [address]      Provider Number: [id number]  Attending Physician Name and Address:  Albertine Grates, MD  Relative Name and Phone Number:  Carpino,[name] Spouse [phone]  [phone]  Cartel, Mauss   [phone]  Vansh, Reckart [phone]  [phone]  Shaw,Bonnita Daughter   [phone]    Current Level of Care: Hospital Recommended Level of Care: Skilled Nursing Facility Prior Approval Number:    Date Approved/Denied:   PASRR Number: [id number]  Discharge Plan: SNF    Current Diagnoses: Patient Active Problem List   Diagnosis Date Noted   Acute metabolic encephalopathy 11/19/2021   Acute encephalopathy 11/18/2021   Recurrent pneumonia 10/20/2021   Pressure injury of skin 05/11/2021   Pericardial effusion, acute    COVID    Palliative care by specialist    Goals of care, counseling/discussion    General weakness    Syncope 05/07/2021   Seizure (HCC) 03/09/2021   Dementia (HCC) 02/25/2021   BPH with urinary obstruction 12/22/2020   Ankle edema, bilateral 07/13/2020   Hallucinations 02/09/2020   Abdominal pain, acute 08/21/2014   GI bleeding 05/26/2012   Renal mass 05/26/2012   Diverticulosis of colon with hemorrhage 05/26/2012   SHOULDER PAIN, BILATERAL 07/30/2010   HERPES SIMPLEX INFECTION 10/10/2007   TACHYCARDIA, PAROXYSMAL NOS 10/10/2007   Dyslipidemia 07/11/2007   Essential hypertension 07/11/2007   ALLERGIC RHINITIS 07/11/2007    Orientation RESPIRATION BLADDER Height & Weight     Self  Normal Incontinent Weight: 167 lb 8.8 oz (76 kg) (per sizewise bed) Height:  6\' 1"  (185.4 cm)  BEHAVIORAL SYMPTOMS/MOOD NEUROLOGICAL BOWEL  NUTRITION STATUS      Continent Diet  AMBULATORY STATUS COMMUNICATION OF NEEDS Skin   Extensive Assist Verbally PU Stage and Appropriate Care                       Personal Care Assistance Level of Assistance  Bathing, Feeding, Dressing Bathing Assistance: Maximum assistance Feeding assistance: Maximum assistance Dressing Assistance: Maximum assistance     Functional Limitations Info  Sight, Hearing, Speech Sight Info: Adequate Hearing Info: Adequate Speech Info: Adequate    SPECIAL CARE FACTORS FREQUENCY  PT (By licensed PT), OT (By licensed OT)     PT Frequency: Minimum 5x a wee OT Frequency: Minimum 5x a week            Contractures      Additional Factors Info  Code Status, Psychotropic, Allergies, Isolation Precautions Code Status Info: Full Code Allergies Info: Chocolate   Ciprofloxacin   Lipitor (Atorvastatin Psychotropic Info: risperiDONE (RISPERDAL) tablet 0.5 mg   Isolation Precautions Info: MRSA for contact precautions.     Current Medications (11/21/2021):  This is the current hospital active medication list Current Facility-Administered Medications  Medication Dose Route Frequency Provider Last Rate Last Admin   acetaminophen (TYLENOL) 160 MG/5ML solution 650 mg  650 mg Per Tube Q6H PRN 11/23/2021, MD       Or   acetaminophen (TYLENOL) suppository 650 mg  650 mg Rectal Q6H PRN Albertine Grates, MD       Ampicillin-Sulbactam (UNASYN) 3 g in sodium chloride 0.9 % 100 mL IVPB  3 g Intravenous Q6H Albertine Grates,  MD 200 mL/hr at 11/21/21 1733 3 g at 11/21/21 1733   ascorbic acid (VITAMIN C) tablet 250 mg  250 mg Per Tube Daily Florencia Reasons, MD   250 mg at 11/21/21 1130   brinzolamide (AZOPT) 1 % ophthalmic suspension 1 drop  1 drop Both Eyes TID Rise Patience, MD   1 drop at 11/21/21 1616   chlorhexidine (PERIDEX) 0.12 % solution 15 mL  15 mL Mouth Rinse BID Florencia Reasons, MD   15 mL at 11/21/21 1156   donepezil (ARICEPT) tablet 10 mg  10 mg Per Tube QHS Florencia Reasons, MD    10 mg at 11/20/21 2117   enoxaparin (LOVENOX) injection 40 mg  40 mg Subcutaneous Q24H Rise Patience, MD   40 mg at 11/21/21 1127   feeding supplement (JEVITY 1.2 CAL) liquid 1,000 mL  1,000 mL Per Tube Continuous Florencia Reasons, MD 50 mL/hr at 11/21/21 0412 Restarted at 11/21/21 0412   feeding supplement (PROSource TF) liquid 45 mL  45 mL Per Tube Daily Florencia Reasons, MD   45 mL at 11/21/21 1140   free water 150 mL  150 mL Per Tube Q6H Florencia Reasons, MD   150 mL at 11/21/21 1733   latanoprost (XALATAN) 0.005 % ophthalmic solution 1 drop  1 drop Both Eyes QHS Rise Patience, MD   1 drop at 11/20/21 2136   levETIRAcetam (KEPPRA) 100 MG/ML solution 500 mg  500 mg Per Tube BID Florencia Reasons, MD       lip balm (CARMEX) ointment   Topical PRN Florencia Reasons, MD   75 application at [id number]   MEDLINE mouth rinse  15 mL Mouth Rinse BID Rise Patience, MD   15 mL at 11/21/21 1157   memantine (NAMENDA) tablet 10 mg  10 mg Per Tube QHS Florencia Reasons, MD   10 mg at 11/20/21 2116   multivitamin liquid 15 mL  15 mL Per Tube Daily Florencia Reasons, MD   15 mL at 11/21/21 1150   Netarsudil Dimesylate 0.02 % SOLN 1 drop  1 drop Both Eyes QHS Rise Patience, MD       omega-3 acid ethyl esters (LOVAZA) capsule 1 g  1 g Oral Daily Rise Patience, MD       polyethylene glycol (MIRALAX / GLYCOLAX) packet 17 g  17 g Per Tube BID Florencia Reasons, MD   17 g at 11/21/21 1143   risperiDONE (RISPERDAL) tablet 0.5 mg  0.5 mg Per Tube QHS Florencia Reasons, MD   0.5 mg at 11/20/21 2117   tamsulosin (FLOMAX) capsule 0.4 mg  0.4 mg Oral Joellyn Quails, MD       valproic acid (DEPAKENE) 250 MG/5ML solution 1,000 mg  1,000 mg Per Tube Daily Florencia Reasons, MD   1,000 mg at 11/21/21 1137   valproic acid (DEPAKENE) 250 MG/5ML solution 500 mg  500 mg Per Tube QHS Florencia Reasons, MD   500 mg at 11/20/21 2118   zinc oxide 20 % ointment   Topical BID Florencia Reasons, MD   Given at 11/21/21 1553     Discharge Medications: Please see discharge summary for a  list of discharge medications.  Relevant Imaging Results:  Relevant Lab Results:   Additional Information SSN [id number]  Ross Ludwig, LCSW

## 2021-11-21 NOTE — Progress Notes (Signed)
Hypoglycemic Event  CBG: 39 at 0739  Treatment: D50 50 mL (25 gm)  Symptoms:  lethargy  Follow-up CBG: Time:0804 CBG Result:192  Possible Reasons for Event: Other: NGT removed by pt during night by patient, replaced and verified for use at 0406 per nightshift RN.  Comments/MD notified: MD paged    Bradd Burner

## 2021-11-22 ENCOUNTER — Inpatient Hospital Stay (HOSPITAL_COMMUNITY): Payer: Medicare Other

## 2021-11-22 LAB — CBC WITH DIFFERENTIAL/PLATELET
Abs Immature Granulocytes: 0.01 10*3/uL (ref 0.00–0.07)
Basophils Absolute: 0 10*3/uL (ref 0.0–0.1)
Basophils Relative: 1 %
Eosinophils Absolute: 0.3 10*3/uL (ref 0.0–0.5)
Eosinophils Relative: 6 %
HCT: 35.2 % — ABNORMAL LOW (ref 39.0–52.0)
Hemoglobin: 11.1 g/dL — ABNORMAL LOW (ref 13.0–17.0)
Immature Granulocytes: 0 %
Lymphocytes Relative: 17 %
Lymphs Abs: 0.7 10*3/uL (ref 0.7–4.0)
MCH: 27 pg (ref 26.0–34.0)
MCHC: 31.5 g/dL (ref 30.0–36.0)
MCV: 85.6 fL (ref 80.0–100.0)
Monocytes Absolute: 1 10*3/uL (ref 0.1–1.0)
Monocytes Relative: 23 %
Neutro Abs: 2.4 10*3/uL (ref 1.7–7.7)
Neutrophils Relative %: 53 %
Platelets: 178 10*3/uL (ref 150–400)
RBC: 4.11 MIL/uL — ABNORMAL LOW (ref 4.22–5.81)
RDW: 14.3 % (ref 11.5–15.5)
WBC: 4.4 10*3/uL (ref 4.0–10.5)
nRBC: 0 % (ref 0.0–0.2)

## 2021-11-22 LAB — GLUCOSE, CAPILLARY
Glucose-Capillary: 103 mg/dL — ABNORMAL HIGH (ref 70–99)
Glucose-Capillary: 104 mg/dL — ABNORMAL HIGH (ref 70–99)
Glucose-Capillary: 111 mg/dL — ABNORMAL HIGH (ref 70–99)
Glucose-Capillary: 112 mg/dL — ABNORMAL HIGH (ref 70–99)
Glucose-Capillary: 125 mg/dL — ABNORMAL HIGH (ref 70–99)
Glucose-Capillary: 139 mg/dL — ABNORMAL HIGH (ref 70–99)
Glucose-Capillary: 91 mg/dL (ref 70–99)

## 2021-11-22 LAB — BASIC METABOLIC PANEL
Anion gap: 4 — ABNORMAL LOW (ref 5–15)
BUN: 14 mg/dL (ref 8–23)
CO2: 32 mmol/L (ref 22–32)
Calcium: 8.2 mg/dL — ABNORMAL LOW (ref 8.9–10.3)
Chloride: 105 mmol/L (ref 98–111)
Creatinine, Ser: 0.61 mg/dL (ref 0.61–1.24)
GFR, Estimated: >60 mL/min (ref >60)
Glucose, Bld: 123 mg/dL — ABNORMAL HIGH (ref 70–99)
Potassium: 4 mmol/L (ref 3.5–5.1)
Sodium: 141 mmol/L (ref 135–145)

## 2021-11-22 LAB — BRAIN NATRIURETIC PEPTIDE: B Natriuretic Peptide: 126.6 pg/mL — ABNORMAL HIGH (ref 0.0–100.0)

## 2021-11-22 LAB — MAGNESIUM: Magnesium: 2.1 mg/dL (ref 1.7–2.4)

## 2021-11-22 MED ORDER — AMOXICILLIN-POT CLAVULANATE 250-62.5 MG/5ML PO SUSR
500.0000 mg | Freq: Three times a day (TID) | ORAL | Status: AC
  Administered 2021-11-22 – 2021-11-25 (×11): 500 mg
  Filled 2021-11-22 (×12): qty 10

## 2021-11-22 NOTE — Progress Notes (Signed)
Speech Language Pathology Treatment: Dysphagia  Patient Details Name: ETHRIDGE SOLLENBERGER MRN: [id number] DOB: [birth year] Today's Date: 11/22/2021 Time: 1002-1009 SLP Time Calculation (min) (ACUTE ONLY): 7 min  Assessment / Plan / Recommendation Clinical Impression  Pt seen for ongoing dysphagia management.  Pt asleep but able to rouse.  Pt accepted PO trials and exhibited prompt oral response. Pt tolerated ice chip by spoon.  With small amount of water by spoon there was immediate, wet cough.  Pt has hx of dysphagia with thickened liquids recommended following MBSS 10/28/21.  With puree pt required 4 swallows per bolus.  Pt consumed soft solid with multiple swallows.  Pt exhibited good oral clearance of solid textures.  NG in place at present; recommend repeat MBSS prior to initiation of PO diet given pt's overall decline in status and recent pneumonia.  MBSS will be scheduled as radiology and SLP schedule permit.  Recommend continuing alternate means of nutrition, hydration, and medication pending results of instrumental swallow evaluation.    HPI HPI: RUSSEL MORAIN is a 86 y.o. male with known history of dementia, seizures, BPH, decubitus ulcers, pericardial effusion anemia was recently admitted for pneumonia discharged about 2 weeks ago was brought to the ER after patient was found to be increasingly confused and lethargic. CT chest was showing bilateral infiltrates concerning for pneumonia also was showing mild pericardial effusion. Most recent MBS 10/28/21 recommending Dys 2/honey thick and pt was able to upgrade to Dys 2/nectar prior to discharge.      SLP Plan  MBS      Recommendations for follow up therapy are one component of a multi-disciplinary discharge planning process, led by the attending physician.  Recommendations may be updated based on patient status, additional functional criteria and insurance authorization.    Recommendations  Diet recommendations: NPO Medication  Administration: Via alternative means                Oral Care Recommendations: Oral care QID Follow Up Recommendations: Other (comment) (TBD) Assistance recommended at discharge: Other (comment) (TBD) SLP Visit Diagnosis: Dysphagia, unspecified (R13.10) Plan: MBS       GO                Kerrie Pleasure , MA, CCC-SLP Acute Rehabilitation Services Office: [phone]   11/22/2021, 10:23 AM

## 2021-11-22 NOTE — Care Management Important Message (Signed)
Important Message  Patient Details IM Letter given to the Patient. Name: [name] [name] MRN: [id number] Date of Birth: 1935/06/16   Medicare Important Message Given:  Yes     [name] 11/22/2021, 12:03 PM

## 2021-11-22 NOTE — Progress Notes (Signed)
Modified Barium Swallow Progress Note  Patient Details  Name: [name] [name] MRN: [id number] Date of Birth: 05-Aug-1935  Today's Date: 11/22/2021  Modified Barium Swallow completed.  Full report located under Chart Review in the Imaging Section.  Brief recommendations include the following:  Clinical Impression  Patient presents with a mild oral and a severe pharyngeal phase dysphagia which is significantly declined as compared to MBS documented from 3 weeks ago. NG feeding tube in place during today's study and was in place during prior MBS as well. During today's MBS, patient was lethargic and required cues to maintain alertness. He was able to follow basic directions to swallow again, cough. First PO administered was puree texture and majority of this bolus become retained in vallecular and pyriform sinus. He exhibited one instance of sensed aspiration (PAS 7) of moderate amount of puree which occured after the swallow and was from pyriform sinus residuals. Several other instances of aspiration occured, all of which were during or after swallows and all were silent. (PAS 8). SLP then gave patient spoon sip of nectar thick liquids, followed by spoon sip of thin liquid barium, both of which did not aid in reducing vallecular and sinus residuals. Very small amount of barium contrast would transit through UES with each swallow. Patient was able to cough and transit penetrate, aspirate out of laryngeal vestibule, but not consistently. There was a mild reduction in amount of vallecular and pyriform sinus residuals with multiple cued cough/throat clear and reswallows, dry swallows, however unable to achieve full clearance of pharyngeal residuals by end of study. SLP is recommending continue with NPO status and prognosis of PO advancement is guarded at this time.   Swallow Evaluation Recommendations       SLP Diet Recommendations: NPO       Medication Administration: Via alternative means                Oral Care Recommendations: Oral care QID;Staff/trained caregiver to provide oral care       Angela Nevin, MA, CCC-SLP Speech Therapy

## 2021-11-22 NOTE — Progress Notes (Signed)
PROGRESS NOTE    MOTT HUMBLE  [id number] DOB: 1935/06/02 DOA: 11/18/2021 PCP: Raymondo Band, MD   Chief Complain:  Brief Narrative: Patient is a 86 year old male with history of seizure, Advanced dementia nursing home resident was discharged from consistent 2 weeks ago for COVID-pneumonia, found to have lethargy on 11:24 AM, per nursing staff patient is normally talkative, aaox1, will even sing to the snf RN, but he was noticed to have increased crackles and rhonchi required suctioning .  He was suspected to have recurrent aspiration pneumonia, currently on antibiotics.  Feeding tube placed due to severe aspiration   Assessment & Plan:   Principal Problem:   Acute encephalopathy Active Problems:   Essential hypertension   Dementia (HCC)   Seizure (HCC)   Pressure injury of skin   Recurrent pneumonia   Acute metabolic encephalopathy   Acute metabolic encephalopathy, Possibly from recurrent aspiration pneumonia -CT head no acute findings, ammonia level unremarkable, valproic acid level below low therapeutic --Blood culture no growth, urine culture no growth, COVID screening negative -Sputum culture ordered on 11/24, pending collection -Feeding tube placed on 11/25 -Speech therapy following.  We will continue to monitor his swallowing ability.  If continues to be dysphasic, plan for PEG placement.  Family agreeable with that.Speech planning for MBS   Left Pleural effusion, no fever, no leukocytosis, on room air Monitor, may need thoracentesis pending respiratory status /O2 sats and  goals of care discussion.  Given a dose of  IV Lasix x1, echo showed diastolic dysfunction There are no lower extremity edema, but by report patient has been mainly bedbound for the last 2 weeks   Right upper extremity edema venous Doppler negative for  DVT Elevate right arm   Pericardial effusion, appear chronic, blood pressure stable   History of seizure On Depakote and Keppra EEG  :no seizure    Left hip pain Pelvic x-ray concerning for nondisplaced left femur fracture, recommend CT for further evaluation , CT did not reveal fracture   BPH Oral meds held, cant be given through tube   Past covid infection in May 2022 and October 2022, wife is wondering when patient can get COVID booster, in general patient is to get covid booster 66months after covid infection Wife also is requesting patient to have a flu vaccine prior to discharge   FTT, frequent falls in the past when he was at home, he was discharge to snf on 11/11,  per SNF RN" patient was only able to sit on edge of bed, but could not get out of bed.  Has not walked since discharged on 11/11"   Advanced dementia/from nursing home/decubitus ulcers/concerning for ongoing aspiration/wife reports " patient has lost a lot of weight in last few months" Overall poor prognosis, continue to risk of aspiration ,palliative care was following.  Currently full code.  Family interested  on PEG placement if needed.  Pressure Injury 05/08/21 Coccyx Mid Stage 1 -  Intact skin with non-blanchable redness of a localized area usually over a bony prominence. (Active)  05/08/21 0515  Location: Coccyx  Location Orientation: Mid  Staging: Stage 1 -  Intact skin with non-blanchable redness of a localized area usually over a bony prominence.  Wound Description (Comments):   Present on Admission: Yes     Pressure Injury 10/25/21 Heel Left Deep Tissue Pressure Injury - Purple or maroon localized area of discolored intact skin or blood-filled blister due to damage of underlying soft tissue from pressure and/or shear. purple  wound on heel (Active)  10/25/21 0800  Location: Heel  Location Orientation: Left  Staging: Deep Tissue Pressure Injury - Purple or maroon localized area of discolored intact skin or blood-filled blister due to damage of underlying soft tissue from pressure and/or shear.  Wound Description (Comments): purple wound on  heel  Present on Admission:      Pressure Injury 10/25/21 Heel Right Deep Tissue Pressure Injury - Purple or maroon localized area of discolored intact skin or blood-filled blister due to damage of underlying soft tissue from pressure and/or shear. purple wound on heel (Active)  10/25/21 0800  Location: Heel  Location Orientation: Right  Staging: Deep Tissue Pressure Injury - Purple or maroon localized area of discolored intact skin or blood-filled blister due to damage of underlying soft tissue from pressure and/or shear.  Wound Description (Comments): purple wound on heel  Present on Admission:      Pressure Injury 11/18/21 Sacrum (Active)  11/18/21 2357  Location: Sacrum  Location Orientation:   Staging:   Wound Description (Comments):   Present on Admission: Yes        Nutrition Problem: Inadequate oral intake Etiology: inability to eat      DVT prophylaxis:Lovenox Code Status: Full Family Communication:  Patient status:  Dispo: The patient is from: SNF              Anticipated d/c is to: SNF/long term care              Anticipated d/c date is: TBD  Consultants: Palliative care  Procedures: Feeding tube placement  Antimicrobials:  Anti-infectives (From admission, onward)    Start     Dose/Rate Route Frequency Ordered Stop   11/19/21 1800  Ampicillin-Sulbactam (UNASYN) 3 g in sodium chloride 0.9 % 100 mL IVPB        3 g 200 mL/hr over 30 Minutes Intravenous Every 6 hours 11/19/21 1644     11/19/21 0100  cefTRIAXone (ROCEPHIN) 2 g in sodium chloride 0.9 % 100 mL IVPB  Status:  Discontinued        2 g 200 mL/hr over 30 Minutes Intravenous Every 24 hours 11/18/21 2358 11/19/21 1621   11/19/21 0100  azithromycin (ZITHROMAX) 500 mg in sodium chloride 0.9 % 250 mL IVPB  Status:  Discontinued        500 mg 250 mL/hr over 60 Minutes Intravenous Every 24 hours 11/18/21 2358 11/19/21 1621   11/18/21 2145  vancomycin (VANCOREADY) IVPB 1500 mg/300 mL  Status:   Discontinued        1,500 mg 150 mL/hr over 120 Minutes Intravenous  Once 11/18/21 2137 11/19/21 0023   11/18/21 2145  ceFEPIme (MAXIPIME) 2 g in sodium chloride 0.9 % 100 mL IVPB  Status:  Discontinued        2 g 200 mL/hr over 30 Minutes Intravenous  Once 11/18/21 2138 11/19/21 0007   11/18/21 2130  ceFEPIme (MAXIPIME) 1 g in sodium chloride 0.9 % 100 mL IVPB  Status:  Discontinued        1 g 200 mL/hr over 30 Minutes Intravenous  Once 11/18/21 2125 11/18/21 2138       Subjective: Patient seen and examined at the bedside this morning.  Hemodynamically stable but he did not respond to verbal stimuli.  Was not in any kind of distress.Lying on bed  Objective: Vitals:   11/21/21 2204 11/22/21 0600 11/22/21 0657 11/22/21 0657  BP: (!) 165/86  125/85   Pulse: 76  68 64  Resp: 20  20   Temp: 97.6 F (36.4 C)  97.8 F (36.6 C)   TempSrc: Oral  Oral   SpO2: 100%   100%  Weight:  77.7 kg    Height:        Intake/Output Summary (Last 24 hours) at 11/22/2021 1007 Last data filed at 11/22/2021 0600 Gross per 24 hour  Intake 1904.98 ml  Output 1525 ml  Net 379.98 ml   Filed Weights   11/20/21 0500 11/21/21 0500 11/22/21 0600  Weight: 79 kg 76 kg 77.7 kg    Examination:  General exam: Very deconditioned, debilitated, chronically ill looking, lying in bed HEENT: Feeding tube Respiratory system:  no wheezes or crackles  Cardiovascular system: S1 & S2 heard, RRR.  Gastrointestinal system: Abdomen is nondistended, soft and nontender. Central nervous system: Not alert or oriented Extremities: No edema, no clubbing ,no cyanosis Skin: No rashes, no ulcers,no icterus      Data Reviewed: I have personally reviewed following labs and imaging studies  CBC: Recent Labs  Lab 11/18/21 1904 11/19/21 0018 11/19/21 0128 11/20/21 0332 11/22/21 0401  WBC 9.5 8.9 8.6 4.0 4.4  NEUTROABS 7.4  --   --   --  2.4  HGB 12.4* 12.7* 12.9* 10.5* 11.1*  HCT 40.5 42.1 41.8 33.9* 35.2*  MCV  87.7 88.3 88.7 89.0 85.6  PLT 299 227 254 206 0000000   Basic Metabolic Panel: Recent Labs  Lab 11/18/21 1926 11/19/21 0018 11/19/21 0128 11/19/21 1901 11/20/21 0332 11/20/21 1706 11/21/21 0350 11/22/21 0401  NA 141  --  140  --  140  --  141 141  K 4.3  --  4.2  --  3.8  --  3.7 4.0  CL 103  --  104  --  106  --  106 105  CO2 29  --  29  --  28  --  30 32  GLUCOSE 114*  --  103*  --  82  --  85 123*  BUN 17  --  17  --  16  --  13 14  CREATININE 0.72 0.83 0.76  --  0.74  --  0.69 0.61  CALCIUM 8.9  --  8.9  --  8.4*  --  8.6* 8.2*  MG  --   --   --  2.0 1.8 2.0 2.0 2.1  PHOS  --   --   --  4.0 3.6 3.9 3.7  --    GFR: Estimated Creatinine Clearance: 75.5 mL/min (by C-G formula based on SCr of 0.61 mg/dL). Liver Function Tests: Recent Labs  Lab 11/18/21 1926 11/19/21 0128  AST 25 22  ALT 17 15  ALKPHOS 59 59  BILITOT 0.2* 0.3  PROT 6.1* 6.0*  ALBUMIN 2.7* 2.7*   No results for input(s): LIPASE, AMYLASE in the last 168 hours. Recent Labs  Lab 11/19/21 0018  AMMONIA 21   Coagulation Profile: Recent Labs  Lab 11/18/21 1904  INR 1.0   Cardiac Enzymes: No results for input(s): CKTOTAL, CKMB, CKMBINDEX, TROPONINI in the last 168 hours. BNP (last 3 results) No results for input(s): PROBNP in the last 8760 hours. HbA1C: No results for input(s): HGBA1C in the last 72 hours. CBG: Recent Labs  Lab 11/21/21 1638 11/21/21 1953 11/22/21 0003 11/22/21 0427 11/22/21 0739  GLUCAP 116* 124* 139* 125* 103*   Lipid Profile: No results for input(s): CHOL, HDL, LDLCALC, TRIG, CHOLHDL, LDLDIRECT in the last 72 hours. Thyroid Function Tests: No results for  input(s): TSH, T4TOTAL, FREET4, T3FREE, THYROIDAB in the last 72 hours. Anemia Panel: No results for input(s): VITAMINB12, FOLATE, FERRITIN, TIBC, IRON, RETICCTPCT in the last 72 hours. Sepsis Labs: Recent Labs  Lab 11/18/21 1904 11/19/21 0018 11/19/21 0834 11/20/21 0332 11/21/21 0350  PROCALCITON  --   --  <0.10  <0.10 <0.10  LATICACIDVEN 1.7 1.1  --   --   --     Recent Results (from the past 240 hour(s))  Blood Culture (routine x 2)     Status: None (Preliminary result)   Collection Time: 11/18/21  7:04 PM   Specimen: BLOOD  Result Value Ref Range Status   Specimen Description   Final    BLOOD BLOOD RIGHT HAND Performed at [facility] [address] [place], [place] [address]    Special Requests   Final    BOTTLES DRAWN AEROBIC AND ANAEROBIC Blood Culture results may not be optimal due to an inadequate volume of blood received in culture bottles Performed at [facility] [address] [place], [place] [address]    Culture   Final    NO GROWTH 4 DAYS Performed at [facility], [place] [address], [place], [place] [address]    Report Status PENDING  Incomplete  Urine Culture     Status: None   Collection Time: 11/18/21  7:04 PM   Specimen: In/Out Cath Urine  Result Value Ref Range Status   Specimen Description   Final    IN/OUT CATH URINE Performed at [facility] [address] [place], [place] [address]    Special Requests   Final    NONE Performed at [facility], [place] [address] [place], [place] [address]    Culture   Final    NO GROWTH Performed at [facility], [place] [address] [place], [place] [address]    Report Status 11/20/2021 FINAL  Final  Resp Panel by RT-PCR (Flu A&B, Covid) Urine, Clean Catch     Status: None   Collection Time: 11/18/21  7:18 PM   Specimen: Urine, Clean Catch; Nasopharyngeal(NP) swabs in vial transport medium  Result Value Ref Range Status   SARS Coronavirus 2 by RT PCR NEGATIVE NEGATIVE Final    Comment: (NOTE) SARS-CoV-2 target nucleic acids are NOT DETECTED.  The SARS-CoV-2 RNA is generally detectable in upper respiratory specimens during the acute phase of infection. The lowest concentration of SARS-CoV-2 viral copies this assay can detect is 138  copies/mL. A negative result does not preclude SARS-Cov-2 infection and should not be used as the sole basis for treatment or other patient management decisions. A negative result may occur with  improper specimen collection/handling, submission of specimen other than nasopharyngeal swab, presence of viral mutation(s) within the areas targeted by this assay, and inadequate number of viral copies(<138 copies/mL). A negative result must be combined with clinical observations, patient history, and epidemiological information. The expected result is Negative.  Fact Sheet for Patients:  EntrepreneurPulse.com.au  Fact Sheet for Healthcare Providers:  IncredibleEmployment.be  This test is no t yet approved or cleared by the [place] FDA and  has been authorized for detection and/or diagnosis of SARS-CoV-2 by FDA under an Emergency Use Authorization (EUA). This EUA will remain  in effect (meaning this test can be used) for the duration of the COVID-19 declaration under Section 564(b)(1) of the Act, 21 U.S.C.section 360bbb-3(b)(1), unless the authorization is terminated  or revoked sooner.  Influenza A by PCR NEGATIVE NEGATIVE Final   Influenza B by PCR NEGATIVE NEGATIVE Final    Comment: (NOTE) The Xpert Xpress SARS-CoV-2/FLU/RSV plus assay is intended as an aid in the diagnosis of influenza from Nasopharyngeal swab specimens and should not be used as a sole basis for treatment. Nasal washings and aspirates are unacceptable for Xpert Xpress SARS-CoV-2/FLU/RSV testing.  Fact Sheet for Patients: BloggerCourse.com  Fact Sheet for Healthcare Providers: SeriousBroker.it  This test is not yet approved or cleared by the [place] FDA and has been authorized for detection and/or diagnosis of SARS-CoV-2 by FDA under an Emergency Use Authorization (EUA). This EUA will remain in effect (meaning  this test can be used) for the duration of the COVID-19 declaration under Section 564(b)(1) of the Act, 21 U.S.C. section 360bbb-3(b)(1), unless the authorization is terminated or revoked.  Performed at [facility], [address] [place], [place] [address]   Blood Culture (routine x 2)     Status: None (Preliminary result)   Collection Time: 11/19/21 12:18 AM   Specimen: BLOOD LEFT ARM  Result Value Ref Range Status   Specimen Description   Final    BLOOD LEFT ARM Performed at [facility], [address], [place], [place] [address]    Special Requests   Final    BOTTLES DRAWN AEROBIC ONLY Blood Culture adequate volume Performed at [facility], [address], [place], [place] [address]    Culture   Final    NO GROWTH 3 DAYS Performed at [facility], [address] [place], [place] [address]    Report Status PENDING  Incomplete         Radiology Studies: DG Abd 1 View  Result Date: 11/21/2021 CLINICAL DATA:  NG placement. EXAM: ABDOMEN - 1 VIEW COMPARISON:  Earlier radiograph dated 11/19/2021. FINDINGS: Feeding tube with weighted tip in the upper abdomen likely in the proximal stomach. Small left and possibly trace right pleural effusion. There is bibasilar atelectasis or infiltrate. Bilateral hazy and interstitial densities may represent edema. Pneumonia is not excluded. There is cardiomegaly. No acute osseous pathology. IMPRESSION: Feeding tube with weighted tip in the proximal stomach. Electronically Signed   By: Elgie Collard M.D.   On: 11/21/2021 03:57   VAS [place] UPPER EXTREMITY VENOUS DUPLEX  Result Date: 11/21/2021 UPPER VENOUS STUDY  Patient Name:  [name] [name]  Date of Exam:   11/21/2021 Medical Rec #: [id number]         Accession #:    [id number] Date of Birth: 1935/11/15         Patient Gender: M Patient Age:   48 years Exam Location:  [facility] Procedure:      VAS [place] UPPER EXTREMITY  VENOUS DUPLEX Referring Phys: Parke Poisson XU --------------------------------------------------------------------------------  Indications: Swelling Limitations: Patient immobilty. Comparison Study: No previous exams Performing Technologist: Jody Hill RVT, RDMS  Examination Guidelines: A complete evaluation includes B-mode imaging, spectral Doppler, color Doppler, and power Doppler as needed of all accessible portions of each vessel. Bilateral testing is considered an integral part of a complete examination. Limited examinations for reoccurring indications may be performed as noted.  Right Findings: +----------+------------+---------+-----------+----------+-------+ RIGHT     CompressiblePhasicitySpontaneousPropertiesSummary +----------+------------+---------+-----------+----------+-------+ IJV           Full       Yes       Yes                      +----------+------------+---------+-----------+----------+-------+  Subclavian    Full       Yes       Yes                      +----------+------------+---------+-----------+----------+-------+ Axillary      Full       Yes       Yes                      +----------+------------+---------+-----------+----------+-------+ Brachial      Full                                          +----------+------------+---------+-----------+----------+-------+ Radial        Full                                          +----------+------------+---------+-----------+----------+-------+ Ulnar         Full                                          +----------+------------+---------+-----------+----------+-------+ Cephalic      Full                                          +----------+------------+---------+-----------+----------+-------+ Basilic       Full       Yes       Yes                      +----------+------------+---------+-----------+----------+-------+  Left Findings:  +----------+------------+---------+-----------+----------+-------+ LEFT      CompressiblePhasicitySpontaneousPropertiesSummary +----------+------------+---------+-----------+----------+-------+ Subclavian    Full       Yes       Yes                      +----------+------------+---------+-----------+----------+-------+  Summary:  Right: No evidence of deep vein thrombosis in the upper extremity. No evidence of superficial vein thrombosis in the upper extremity.  Left: No evidence of thrombosis in the subclavian.  *See table(s) above for measurements and observations.    Preliminary         Scheduled Meds:  vitamin C  250 mg Per Tube Daily   brinzolamide  1 drop Both Eyes TID   chlorhexidine  15 mL Mouth Rinse BID   donepezil  10 mg Per Tube QHS   enoxaparin (LOVENOX) injection  40 mg Subcutaneous Q24H   feeding supplement (PROSource TF)  45 mL Per Tube Daily   free water  150 mL Per Tube Q6H   latanoprost  1 drop Both Eyes QHS   levETIRAcetam  500 mg Per Tube BID   mouth rinse  15 mL Mouth Rinse BID   memantine  10 mg Per Tube QHS   multivitamin  15 mL Per Tube Daily   Netarsudil Dimesylate  1 drop Both Eyes QHS   polyethylene glycol  17 g Per Tube BID   risperiDONE  0.5 mg Per Tube QHS   valproic acid  1,000 mg Per Tube Daily   valproic acid  500 mg Per Tube  QHS   zinc oxide   Topical BID   Continuous Infusions:  ampicillin-sulbactam (UNASYN) IV 3 g (11/22/21 [id number])   feeding supplement (JEVITY 1.2 CAL) 1,000 mL (11/22/21 0449)     LOS: 3 days    Time spent: 35 mins.More than 50% of that time was spent in counseling and/or coordination of care.      Shelly [place], MD [facility] P11/28/2022, 10:07 AM

## 2021-11-23 ENCOUNTER — Inpatient Hospital Stay (HOSPITAL_COMMUNITY): Payer: Medicare Other

## 2021-11-23 LAB — GLUCOSE, CAPILLARY
Glucose-Capillary: 113 mg/dL — ABNORMAL HIGH (ref 70–99)
Glucose-Capillary: 111 mg/dL — ABNORMAL HIGH (ref 70–99)
Glucose-Capillary: 89 mg/dL (ref 70–99)
Glucose-Capillary: 99 mg/dL (ref 70–99)
Glucose-Capillary: 103 mg/dL — ABNORMAL HIGH (ref 70–99)

## 2021-11-23 LAB — CULTURE, BLOOD (ROUTINE X 2): Culture: NO GROWTH

## 2021-11-23 NOTE — Progress Notes (Signed)
Nutrition Follow-up  DOCUMENTATION CODES:   Not applicable  INTERVENTION:  - continue Jevity 1.2 @ 70 ml/hr with 45 ml Prosource TF once/day and 150 ml free water QID. - will monitor for plan concerning PEG.    NUTRITION DIAGNOSIS:   Inadequate oral intake related to inability to eat as evidenced by NPO status. -ongoing  GOAL:   Patient will meet greater than or equal to 90% of their needs -met with TF regimen  MONITOR:   TF tolerance, Diet advancement, Labs, Weight trends, Skin  REASON FOR ASSESSMENT:   Consult Assessment of nutrition requirement/status  ASSESSMENT:   86 y.o. male with known history of dementia, seizures, BPH and decubitus ulcers and pericardial effusion anemia was recently admitted for pneumonia discharged about 2 weeks ago was brought to the ER after patient was found to be increasingly confused and lethargic since this morning.  No further history is available.  We will try to reach family to get further history.  Patient laying in bed with no visitors present at the time of RD visit. He has small bore NGT in R nare (placed 11/25; gastric).  He is receiving Jevity 1.2 @ 70 ml/hr with 45 ml Prosource TF once/day and 150 ml free water QID. This regimen is providing 2056 kcal, 104 grams protein, and 1956 ml free water.   MBS done yesterday afternoon and recommendation at that time was for NPO status and medications and nutrition to be via alternative means.   MD note from today states "if continues to be dysphasic, plan for PEG placement".   Weight has been fluctuating since admission: 166-174 lb. Non-pitting edema to BLE documented in the edema section of flow sheet.    Labs reviewed; CBGs: 113, 111, 89 mg/dl.  Medications reviewed; 250 mg ascorbic acid/day, 15 ml multivitamin/day per small bore NGT.    NUTRITION - FOCUSED PHYSICAL EXAM:  Flowsheet Row Most Recent Value  Orbital Region No depletion  Upper Arm Region Mild depletion  Thoracic and  Lumbar Region Unable to assess  Buccal Region No depletion  Temple Region No depletion  Clavicle Bone Region Mild depletion  Clavicle and Acromion Bone Region No depletion  Scapular Bone Region Mild depletion  Dorsal Hand Unable to assess  [mittens]  Patellar Region Moderate depletion  Anterior Thigh Region Mild depletion  Posterior Calf Region Unable to assess  [bilateral boots]  Edema (RD Assessment) None  Hair Reviewed  Eyes Reviewed  Mouth Reviewed  Skin Reviewed  Nails Unable to assess       Diet Order:   Diet Order             Diet NPO time specified  Diet effective now                   EDUCATION NEEDS:   No education needs have been identified at this time  Skin:  Skin Assessment: Skin Integrity Issues: Skin Integrity Issues:: DTI, Other (Comment) DTI: bilateral heels Other: skin tears to bilateral pre-tibial area, sacrum, and L thigh  Last BM:  11/29 (type 6 x3)  Height:   Ht Readings from Last 1 Encounters:  11/18/21 6' 1" (1.854 m)    Weight:   Wt Readings from Last 1 Encounters:  11/23/21 78 kg     Estimated Nutritional Needs:  Kcal:  2000-2200 kcal Protein:  100-115 grams Fluid:  >/= 2 L/day      Jarome Matin, MS, RD, LDN, CNSC Inpatient Clinical Dietitian RD pager # available  in [facility]  After hours/weekend pager # available in [facility]

## 2021-11-23 NOTE — Progress Notes (Signed)
SLP Cancellation Note  Patient Details Name: [name] [name] MRN: [id number] DOB: May 13, 1935   Cancelled treatment:      Pt poorly responsive; family not at bedside.  Per  MBS yesterday, pharyngeal dysphagia is severe, with notable aspiration of pureed consistency. Will follow for POC.  Yareli Carthen L. [name], MA CCC/SLP Acute Rehabilitation Services Office number [phone] Pager [phone]      [name] 11/23/2021, 12:07 PM

## 2021-11-23 NOTE — TOC Progression Note (Addendum)
Transition of Care [facility]) - Progression Note    Patient Details  Name: [name] [name] MRN: [id number] Date of Birth: [birth year]  Transition of Care [facility]) CM/SW Contact  Golda Acre, RN Phone Number: 11/23/2021, 8:03 AM  Clinical Narrative:    86 year old male with history of seizure, Advanced dementia nursing home resident was discharged from [facility] 2 weeks ago for COVID-pneumonia, found to have lethargy on 11:24 AM, per nursing staff patient is normally talkative, aaox1, will even sing to the snf RN, but he was noticed to have increased crackles and rhonchi required suctioning .  He was suspected to have recurrent aspiration pneumonia, currently on antibiotics.  Feeding tube placed due to severe aspiration    Assessment & Plan:   Principal Problem:   Acute encephalopathy Active Problems:   Essential hypertension   Dementia (HCC)   Seizure (HCC)   Pressure injury of skin   Recurrent pneumonia   Acute metabolic encephalopathy  TOC PLAN OF CARE: Will follow up with Lacinda Axon today concerning bed placement for long term care. Tct-Clyve at [facility] message left for him to call back will ask about the peg tube. [facility] caN HANDLE THE PEG TUBE.  But patient does owe a outstanding bill that will have to be cleared up before he can return per Gretchen Portela at [facility]. Tct-wife made aware that the bill must be handled beofre patient can return to [facility].  She understands and will call Clyve about it.  Expected Discharge Plan: Skilled Nursing Facility Barriers to Discharge: Continued Medical Work up  Expected Discharge Plan and Services Expected Discharge Plan: Skilled Nursing Facility                                               Social Determinants of Health (SDOH) Interventions    Readmission Risk Interventions Readmission Risk Prevention Plan 10/22/2021  Transportation Screening Complete  Medication Review (RN Care Manager) Referral to Pharmacy   PCP or Specialist appointment within 3-5 days of discharge Complete  HRI or Home Care Consult Not Complete  HRI or Home Care Consult Pt Refusal Comments from SNF  SW Recovery Care/Counseling Consult Complete  Palliative Care Screening Not Applicable  Skilled Nursing Facility Complete  Some recent data might be hidden

## 2021-11-23 NOTE — Progress Notes (Addendum)
PROGRESS NOTE    [name] [name]  [id number] DOB: January 02, 1935 DOA: 11/18/2021 PCP: Raymondo Band, MD   Chief Complain:Lethargy  Brief Narrative: Patient is a 86 year old male with history of seizure, Advanced dementia ,nursing home resident who was discharged from here  2 weeks ago for COVID-pneumonia, found to have lethargy on 11:24 AM, per nursing staff patient is normally talkative, aaox1, will even sing to the snf RN, but he was noticed to have increased crackles and rhonchi required suctioning .  He was suspected to have recurrent aspiration pneumonia, so sent here.currently on antibiotics for aspiration pneumonia.  Mental status slowly improving but he is severely dysphasic requiring feeding tube.Speech therapy following   Assessment & Plan:   Principal Problem:   Acute encephalopathy Active Problems:   Essential hypertension   Dementia (HCC)   Seizure (HCC)   Pressure injury of skin   Recurrent pneumonia   Acute metabolic encephalopathy   Acute metabolic encephalopathy, Possibly from recurrent aspiration pneumonia -CT head no acute findings, ammonia level unremarkable, valproic acid level below low therapeutic -Currently on Augmentin. --Blood culture no growth, urine culture no growth, COVID screening negative -Sputum culture ordered on 11/24, pending collection -Feeding tube placed on 11/25 -Speech therapy following.  We will continue to monitor his swallowing ability.  If continues to be dysphasic, plan for PEG placement.Marland KitchenSpeech did MBS and recommended NPO -Mental status has improved, he responds to voice but keeps eyes closed   Left Pleural effusion Monitor, small to moderate as per CT.  Given a dose of  IV Lasix x1, echo showed diastolic dysfunction There are no lower extremity edema Since respiratory status is stable, we will not do thoracentesis at this time   right upper extremity edema venous Doppler negative for  DVT Elevate right arm   History of  seizure On Depakote and Keppra EEG :no seizure    Left hip pain Pelvic x-ray concerning for nondisplaced left femur fracture, recommend CT for further evaluation , CT did not reveal fracture   BPH Oral meds held, cant be given through tube    FTT/Dysphagia/high risk for aspiration Frequent falls in the past when he was at home, he was discharge to snf on 11/11,  per SNF RN" patient was only able to sit on edge of bed, but could not get out of bed.  Has not walked since discharged on 11/11" Family interested  on PEG placement if needed.  Wife says is okay to go ahead and plan for PEG if needed.  We will follow-up with the speech therapy and monitor his swallowing ability for next few days, if there is no improvement.  We will put an order for IR guided PEG placement Plan is to discharge him back to skilled nursing facility after that.   Advanced dementia/from nursing home: Continue supportive care. Delirium  precautions  Pressure Injury 05/08/21 Coccyx Mid Stage 1 -  Intact skin with non-blanchable redness of a localized area usually over a bony prominence. (Active)  05/08/21 0515  Location: Coccyx  Location Orientation: Mid  Staging: Stage 1 -  Intact skin with non-blanchable redness of a localized area usually over a bony prominence.  Wound Description (Comments):   Present on Admission: Yes     Pressure Injury 10/25/21 Heel Left Deep Tissue Pressure Injury - Purple or maroon localized area of discolored intact skin or blood-filled blister due to damage of underlying soft tissue from pressure and/or shear. purple wound on heel (Active)  10/25/21 0800  Location: Heel  Location Orientation: Left  Staging: Deep Tissue Pressure Injury - Purple or maroon localized area of discolored intact skin or blood-filled blister due to damage of underlying soft tissue from pressure and/or shear.  Wound Description (Comments): purple wound on heel  Present on Admission:      Pressure Injury  10/25/21 Heel Right Deep Tissue Pressure Injury - Purple or maroon localized area of discolored intact skin or blood-filled blister due to damage of underlying soft tissue from pressure and/or shear. purple wound on heel (Active)  10/25/21 0800  Location: Heel  Location Orientation: Right  Staging: Deep Tissue Pressure Injury - Purple or maroon localized area of discolored intact skin or blood-filled blister due to damage of underlying soft tissue from pressure and/or shear.  Wound Description (Comments): purple wound on heel  Present on Admission:      Pressure Injury 11/18/21 Sacrum (Active)  11/18/21 2357  Location: Sacrum  Location Orientation:   Staging:   Wound Description (Comments):   Present on Admission: Yes        Nutrition Problem: Inadequate oral intake Etiology: inability to eat      DVT prophylaxis:Lovenox Code Status: Full Family Communication: Wife on phone on 11/23/21 Patient status:  Dispo: The patient is from: SNF              Anticipated d/c is to: SNF/long term care              Anticipated d/c date is: TBD  Consultants: Palliative care  Procedures: Feeding tube placement  Antimicrobials:  Anti-infectives (From admission, onward)    Start     Dose/Rate Route Frequency Ordered Stop   11/22/21 1400  amoxicillin-clavulanate (AUGMENTIN) 250-62.5 MG/5ML suspension 500 mg        500 mg Per Tube Every 8 hours 11/22/21 1037 11/26/21 0559   11/19/21 1800  Ampicillin-Sulbactam (UNASYN) 3 g in sodium chloride 0.9 % 100 mL IVPB  Status:  Discontinued        3 g 200 mL/hr over 30 Minutes Intravenous Every 6 hours 11/19/21 1644 11/22/21 1036   11/19/21 0100  cefTRIAXone (ROCEPHIN) 2 g in sodium chloride 0.9 % 100 mL IVPB  Status:  Discontinued        2 g 200 mL/hr over 30 Minutes Intravenous Every 24 hours 11/18/21 2358 11/19/21 1621   11/19/21 0100  azithromycin (ZITHROMAX) 500 mg in sodium chloride 0.9 % 250 mL IVPB  Status:  Discontinued        500  mg 250 mL/hr over 60 Minutes Intravenous Every 24 hours 11/18/21 2358 11/19/21 1621   11/18/21 2145  vancomycin (VANCOREADY) IVPB 1500 mg/300 mL  Status:  Discontinued        1,500 mg 150 mL/hr over 120 Minutes Intravenous  Once 11/18/21 2137 11/19/21 0023   11/18/21 2145  ceFEPIme (MAXIPIME) 2 g in sodium chloride 0.9 % 100 mL IVPB  Status:  Discontinued        2 g 200 mL/hr over 30 Minutes Intravenous  Once 11/18/21 2138 11/19/21 0007   11/18/21 2130  ceFEPIme (MAXIPIME) 1 g in sodium chloride 0.9 % 100 mL IVPB  Status:  Discontinued        1 g 200 mL/hr over 30 Minutes Intravenous  Once 11/18/21 2125 11/18/21 2138       Subjective: Patient seen and examined the bedside this morning.  Hemodynamically stable.  On room air.  On tube feeding.  Eyes closed but responds to voice and  tries to answer the questions.  Not in distress  Objective: Vitals:   11/22/21 1808 11/22/21 2308 11/23/21 0411 11/23/21 0439  BP: (!) 150/86 135/82 116/73   Pulse: 66 69 (!) 51   Resp: 18 18 15    Temp: 97.7 F (36.5 C) 98 F (36.7 C) 97.8 F (36.6 C)   TempSrc: Oral  Oral   SpO2: 99% 99% 95%   Weight:    78 kg  Height:        Intake/Output Summary (Last 24 hours) at 11/23/2021 0811 Last data filed at 11/23/2021 0439 Gross per 24 hour  Intake 2450 ml  Output 1050 ml  Net 1400 ml   Filed Weights   11/21/21 0500 11/22/21 0600 11/23/21 0439  Weight: 76 kg 77.7 kg 78 kg    Examination:   General exam: Very deconditioned, debilitated, chronically looking, lying in bed HEENT: Feeding tube Respiratory system:  no wheezes or crackles  Cardiovascular system: S1 & S2 heard, RRR.  Gastrointestinal system: Abdomen is nondistended, soft and nontender. Central nervous system: Not alert and oriented Extremities: No edema, no clubbing ,no cyanosis Skin: pressure ulcers as above     Data Reviewed: I have personally reviewed following labs and imaging studies  CBC: Recent Labs  Lab 11/18/21 1904  11/19/21 0018 11/19/21 0128 11/20/21 0332 11/22/21 0401  WBC 9.5 8.9 8.6 4.0 4.4  NEUTROABS 7.4  --   --   --  2.4  HGB 12.4* 12.7* 12.9* 10.5* 11.1*  HCT 40.5 42.1 41.8 33.9* 35.2*  MCV 87.7 88.3 88.7 89.0 85.6  PLT 299 227 254 206 0000000   Basic Metabolic Panel: Recent Labs  Lab 11/18/21 1926 11/19/21 0018 11/19/21 0128 11/19/21 1901 11/20/21 0332 11/20/21 1706 11/21/21 0350 11/22/21 0401  NA 141  --  140  --  140  --  141 141  K 4.3  --  4.2  --  3.8  --  3.7 4.0  CL 103  --  104  --  106  --  106 105  CO2 29  --  29  --  28  --  30 32  GLUCOSE 114*  --  103*  --  82  --  85 123*  BUN 17  --  17  --  16  --  13 14  CREATININE 0.72 0.83 0.76  --  0.74  --  0.69 0.61  CALCIUM 8.9  --  8.9  --  8.4*  --  8.6* 8.2*  MG  --   --   --  2.0 1.8 2.0 2.0 2.1  PHOS  --   --   --  4.0 3.6 3.9 3.7  --    GFR: Estimated Creatinine Clearance: 75.8 mL/min (by C-G formula based on SCr of 0.61 mg/dL). Liver Function Tests: Recent Labs  Lab 11/18/21 1926 11/19/21 0128  AST 25 22  ALT 17 15  ALKPHOS 59 59  BILITOT 0.2* 0.3  PROT 6.1* 6.0*  ALBUMIN 2.7* 2.7*   No results for input(s): LIPASE, AMYLASE in the last 168 hours. Recent Labs  Lab 11/19/21 0018  AMMONIA 21   Coagulation Profile: Recent Labs  Lab 11/18/21 1904  INR 1.0   Cardiac Enzymes: No results for input(s): CKTOTAL, CKMB, CKMBINDEX, TROPONINI in the last 168 hours. BNP (last 3 results) No results for input(s): PROBNP in the last 8760 hours. HbA1C: No results for input(s): HGBA1C in the last 72 hours. CBG: Recent Labs  Lab 11/22/21 1704 11/22/21 1942  11/22/21 2357 11/23/21 0409 11/23/21 0744  GLUCAP 91 112* 111* 113* 111*   Lipid Profile: No results for input(s): CHOL, HDL, LDLCALC, TRIG, CHOLHDL, LDLDIRECT in the last 72 hours. Thyroid Function Tests: No results for input(s): TSH, T4TOTAL, FREET4, T3FREE, THYROIDAB in the last 72 hours. Anemia Panel: No results for input(s): VITAMINB12,  FOLATE, FERRITIN, TIBC, IRON, RETICCTPCT in the last 72 hours. Sepsis Labs: Recent Labs  Lab 11/18/21 1904 11/19/21 0018 11/19/21 0834 11/20/21 0332 11/21/21 0350  PROCALCITON  --   --  <0.10 <0.10 <0.10  LATICACIDVEN 1.7 1.1  --   --   --     Recent Results (from the past 240 hour(s))  Blood Culture (routine x 2)     Status: None   Collection Time: 11/18/21  7:04 PM   Specimen: BLOOD  Result Value Ref Range Status   Specimen Description   Final    BLOOD BLOOD RIGHT HAND Performed at [facility], [place] [address] [place], [place] [address]    Special Requests   Final    BOTTLES DRAWN AEROBIC AND ANAEROBIC Blood Culture results may not be optimal due to an inadequate volume of blood received in culture bottles Performed at [facility] [address], [place], [place] [address]    Culture   Final    NO GROWTH 5 DAYS Performed at [facility], [place] [address], [place], [place] [address]    Report Status 11/23/2021 FINAL  Final  Urine Culture     Status: None   Collection Time: 11/18/21  7:04 PM   Specimen: In/Out Cath Urine  Result Value Ref Range Status   Specimen Description   Final    IN/OUT CATH URINE Performed at [place] [address] [place], [place] [address]    Special Requests   Final    NONE Performed at [facility], [place] [address] [place], [place] [address]    Culture   Final    NO GROWTH Performed at [facility], [place] [address], [place], [place] [address]    Report Status 11/20/2021 FINAL  Final  Resp Panel by RT-PCR (Flu A&B, Covid) Urine, Clean Catch     Status: None   Collection Time: 11/18/21  7:18 PM   Specimen: Urine, Clean Catch; Nasopharyngeal(NP) swabs in vial transport medium  Result Value Ref Range Status   SARS Coronavirus 2 by RT PCR NEGATIVE NEGATIVE Final    Comment: (NOTE) SARS-CoV-2 target nucleic acids are NOT DETECTED.  The SARS-CoV-2  RNA is generally detectable in upper respiratory specimens during the acute phase of infection. The lowest concentration of SARS-CoV-2 viral copies this assay can detect is 138 copies/mL. A negative result does not preclude SARS-Cov-2 infection and should not be used as the sole basis for treatment or other patient management decisions. A negative result may occur with  improper specimen collection/handling, submission of specimen other than nasopharyngeal swab, presence of viral mutation(s) within the areas targeted by this assay, and inadequate number of viral copies(<138 copies/mL). A negative result must be combined with clinical observations, patient history, and epidemiological information. The expected result is Negative.  Fact Sheet for Patients:  EntrepreneurPulse.com.au  Fact Sheet for Healthcare Providers:  IncredibleEmployment.be  This test is no t yet approved or cleared by the [place] FDA and  has been authorized for detection and/or diagnosis of SARS-CoV-2 by FDA under an Emergency Use Authorization (EUA). This EUA will remain  in effect (meaning this test can be used) for the duration of the COVID-19 declaration under Section 564(b)(1) of the Act, 21 U.S.C.section 360bbb-3(b)(1), unless the authorization is terminated  or revoked sooner.       Influenza A by PCR NEGATIVE NEGATIVE Final   Influenza B by PCR NEGATIVE NEGATIVE Final    Comment: (NOTE) The Xpert Xpress SARS-CoV-2/FLU/RSV plus assay is intended as an aid in the diagnosis of influenza from Nasopharyngeal swab specimens and should not be used as a sole basis for treatment. Nasal washings and aspirates are unacceptable for Xpert Xpress SARS-CoV-2/FLU/RSV testing.  Fact Sheet for Patients: EntrepreneurPulse.com.au  Fact Sheet for Healthcare Providers: IncredibleEmployment.be  This test is not yet approved or cleared by the  [place] FDA and has been authorized for detection and/or diagnosis of SARS-CoV-2 by FDA under an Emergency Use Authorization (EUA). This EUA will remain in effect (meaning this test can be used) for the duration of the COVID-19 declaration under Section 564(b)(1) of the Act, 21 U.S.C. section 360bbb-3(b)(1), unless the authorization is terminated or revoked.  Performed at [facility], [place] [address], [place], [place] [address]   Blood Culture (routine x 2)     Status: None (Preliminary result)   Collection Time: 11/19/21 12:18 AM   Specimen: BLOOD LEFT ARM  Result Value Ref Range Status   Specimen Description   Final    BLOOD LEFT ARM Performed at [facility] [address] [place], [place] [address]    Special Requests   Final    BOTTLES DRAWN AEROBIC ONLY Blood Culture adequate volume Performed at [facility] [address] [place], [place] [address]    Culture   Final    NO GROWTH 4 DAYS Performed at [facility], [place] [address], [place], [place] [address]    Report Status PENDING  Incomplete         Radiology Studies: DG Swallowing Func-Speech Pathology  Result Date: 11/22/2021 Table formatting from the original result was not included. Objective Swallowing Evaluation: Type of Study: MBS-Modified Barium Swallow Study  Patient Details Name: GARRET MERE MRN: [id number] Date of Birth: 04-23-1935 Today's Date: 11/22/2021 Time: SLP Start Time (ACUTE ONLY): 1345 -SLP Stop Time (ACUTE ONLY): [id number] SLP Time Calculation (min) (ACUTE ONLY): 20 min Past Medical History: Past Medical History: Diagnosis Date  Allergy   Dementia ([place])   sees Dr. Ellouise Newer   Diverticulosis of colon (without mention of hemorrhage) 03-01-2004, 04-04-2011  Colonoscopy  ED (erectile dysfunction)   Glaucoma   sees Dr. Crecencio Mc   Hyperglycemia   Hyperlipidemia   Hypertension   Internal hemorrhoids 03-15-1999  Flex Sig   Irritable  bowel  Past Surgical History: Past Surgical History: Procedure Laterality Date  COLONOSCOPY  04-04-11  per Dr. Sharlett Iles, clear, no repeats needed  EYE SURGERY    bilateral cataract extraction per Dr Dolores Lory  INGUINAL HERNIA REPAIR    KNEE ARTHROSCOPY    right knee  NASAL SINUS SURGERY   HPI: DEMARQUISE WEATHERLEY is a 86 y.o. male with known history of dementia, seizures, BPH, decubitus ulcers, pericardial effusion anemia was recently admitted for pneumonia discharged about 2 weeks ago was brought to the ER after patient was found to be increasingly confused and lethargic. CT chest was showing bilateral infiltrates concerning for pneumonia also was showing mild pericardial effusion. Most recent MBS 10/28/21 recommending Dys 2/honey thick and pt was able to upgrade to Dys 2/nectar prior  to discharge.  Subjective: lethargic but able to particiate in minimal amount of PO intake and follow basic directions  Recommendations for follow up therapy are one component of a multi-disciplinary discharge planning process, led by the attending physician.  Recommendations may be updated based on patient status, additional functional criteria and insurance authorization. Assessment / Plan / Recommendation Clinical Impressions 11/22/2021 Clinical Impression Patient presents with a mild oral and a severe pharyngeal phase dysphagia which is significantly declined as compared to MBS documented from 3 weeks ago. NG feeding tube in place during today's study and was in place during prior MBS as well. During today's MBS, patient was lethargic and required cues to maintain alertness. He was able to follow basic directions to swallow again, cough. First PO administered was puree texture and majority of this bolus become retained in vallecular and pyriform sinus. He exhibited one instance of sensed aspiration (PAS 7) of moderate amount of puree which occured after the swallow and was from pyriform sinus residuals. Several other instances of aspiration  occured, all of which were during or after swallows and all were silent. (PAS 8). SLP then gave patient spoon sip of nectar thick liquids, followed by spoon sip of thin liquid barium, both of which did not aid in reducing vallecular and sinus residuals. Very small amount of barium contrast would transit through UES with each swallow. Patient was able to cough and transit penetrate, aspirate out of laryngeal vestibule, but not consistently. There was a mild reduction in amount of vallecular and pyriform sinus residuals with multiple cued cough/throat clear and reswallows, dry swallows, however unable to achieve full clearance of pharyngeal residuals by end of study. SLP is recommending continue with NPO status and prognosis of PO advancement is guarded at this time. SLP Visit Diagnosis Dysphagia, oropharyngeal phase (R13.12) Attention and concentration deficit following -- Frontal lobe and executive function deficit following -- Impact on safety and function Severe aspiration risk;Risk for inadequate nutrition/hydration   Treatment Recommendations 11/22/2021 Treatment Recommendations Therapy as outlined in treatment plan below   Prognosis 11/22/2021 Prognosis for Safe Diet Advancement Guarded Barriers to Reach Goals Severity of deficits;Cognitive deficits Barriers/Prognosis Comment -- Diet Recommendations 11/22/2021 SLP Diet Recommendations NPO Liquid Administration via -- Medication Administration Via alternative means Compensations -- Postural Changes --   Other Recommendations 11/22/2021 Recommended Consults -- Oral Care Recommendations Oral care QID;Staff/trained caregiver to provide oral care Other Recommendations -- Follow Up Recommendations Skilled nursing-short term rehab (<3 hours/day) Assistance recommended at discharge Frequent or constant Supervision/Assistance Functional Status Assessment Patient has had a recent decline in their functional status and/or demonstrates limited ability to make significant  improvements in function in a reasonable and predictable amount of time Frequency and Duration  11/22/2021 Speech Therapy Frequency (ACUTE ONLY) min 1 x/week Treatment Duration 1 week   Oral Phase 11/22/2021 Oral Phase Impaired Oral - Pudding Teaspoon -- Oral - Pudding Cup -- Oral - Honey Teaspoon -- Oral - Honey Cup NT Oral - Nectar Teaspoon Delayed oral transit;Reduced posterior propulsion Oral - Nectar Cup NT Oral - Nectar Straw NT Oral - Thin Teaspoon Delayed oral transit;Reduced posterior propulsion Oral - Thin Cup NT Oral - Thin Straw NT Oral - Puree Delayed oral transit Oral - Mech Soft -- Oral - Regular NT Oral - Multi-Consistency -- Oral - Pill NT Oral Phase - Comment --  Pharyngeal Phase 11/22/2021 Pharyngeal Phase Impaired Pharyngeal- Pudding Teaspoon -- Pharyngeal -- Pharyngeal- Pudding Cup -- Pharyngeal -- Pharyngeal- Honey Teaspoon NT Pharyngeal -- Pharyngeal-  Honey Cup NT Pharyngeal -- Pharyngeal- Nectar Teaspoon Reduced anterior laryngeal mobility;Reduced airway/laryngeal closure;Penetration/Apiration after swallow;Moderate aspiration;Pharyngeal residue - valleculae;Pharyngeal residue - pyriform Pharyngeal -- Pharyngeal- Nectar Cup NT Pharyngeal -- Pharyngeal- Nectar Straw -- Pharyngeal -- Pharyngeal- Thin Teaspoon Delayed swallow initiation-vallecula;Reduced anterior laryngeal mobility;Reduced airway/laryngeal closure;Moderate aspiration;Penetration/Apiration after swallow;Penetration/Aspiration during swallow;Pharyngeal residue - valleculae;Pharyngeal residue - pyriform Pharyngeal -- Pharyngeal- Thin Cup NT Pharyngeal -- Pharyngeal- Thin Straw NT Pharyngeal -- Pharyngeal- Puree Delayed swallow initiation-vallecula;Penetration/Apiration after swallow;Moderate aspiration;Pharyngeal residue - pyriform;Pharyngeal residue - valleculae;Reduced anterior laryngeal mobility;Reduced airway/laryngeal closure Pharyngeal -- Pharyngeal- Mechanical Soft -- Pharyngeal -- Pharyngeal- Regular NT Pharyngeal --  Pharyngeal- Multi-consistency -- Pharyngeal -- Pharyngeal- Pill NT Pharyngeal -- Pharyngeal Comment --  Cervical Esophageal Phase  11/22/2021 Cervical Esophageal Phase Impaired Pudding Teaspoon -- Pudding Cup -- Honey Teaspoon -- Honey Cup -- Nectar Teaspoon Reduced cricopharyngeal relaxation Nectar Cup -- Nectar Straw -- Thin Teaspoon Reduced cricopharyngeal relaxation Thin Cup Reduced cricopharyngeal relaxation Thin Straw -- Puree Reduced cricopharyngeal relaxation Mechanical Soft -- Regular -- Multi-consistency -- Pill -- Cervical Esophageal Comment -- Angela Nevin, MA, CCC-SLP Speech Therapy                     VAS [place] UPPER EXTREMITY VENOUS DUPLEX  Result Date: 11/22/2021 UPPER VENOUS STUDY  Patient Name:  YOSSEF GILKISON  Date of Exam:   11/21/2021 Medical Rec #: [id number]         Accession #:    [id number] Date of Birth: Nov 15, 1935         Patient Gender: M Patient Age:   50 years Exam Location:  [facility] Procedure:      VAS [place] UPPER EXTREMITY VENOUS DUPLEX Referring Phys: Parke Poisson XU --------------------------------------------------------------------------------  Indications: Swelling Limitations: Patient immobilty. Comparison Study: No previous exams Performing Technologist: Jody Hill RVT, RDMS  Examination Guidelines: A complete evaluation includes B-mode imaging, spectral Doppler, color Doppler, and power Doppler as needed of all accessible portions of each vessel. Bilateral testing is considered an integral part of a complete examination. Limited examinations for reoccurring indications may be performed as noted.  Right Findings: +----------+------------+---------+-----------+----------+-------+ RIGHT     CompressiblePhasicitySpontaneousPropertiesSummary +----------+------------+---------+-----------+----------+-------+ IJV           Full       Yes       Yes                      +----------+------------+---------+-----------+----------+-------+ Subclavian    Full        Yes       Yes                      +----------+------------+---------+-----------+----------+-------+ Axillary      Full       Yes       Yes                      +----------+------------+---------+-----------+----------+-------+ Brachial      Full                                          +----------+------------+---------+-----------+----------+-------+ Radial        Full                                          +----------+------------+---------+-----------+----------+-------+  Ulnar         Full                                          +----------+------------+---------+-----------+----------+-------+ Cephalic      Full                                          +----------+------------+---------+-----------+----------+-------+ Basilic       Full       Yes       Yes                      +----------+------------+---------+-----------+----------+-------+  Left Findings: +----------+------------+---------+-----------+----------+-------+ LEFT      CompressiblePhasicitySpontaneousPropertiesSummary +----------+------------+---------+-----------+----------+-------+ Subclavian    Full       Yes       Yes                      +----------+------------+---------+-----------+----------+-------+  Summary:  Right: No evidence of deep vein thrombosis in the upper extremity. No evidence of superficial vein thrombosis in the upper extremity.  Left: No evidence of thrombosis in the subclavian.  *See table(s) above for measurements and observations.  Diagnosing physician: Orlie Pollen Electronically signed by Orlie Pollen on 11/22/2021 at 10:23:16 AM.    Final         Scheduled Meds:  amoxicillin-clavulanate  500 mg Per Tube Q8H   vitamin C  250 mg Per Tube Daily   brinzolamide  1 drop Both Eyes TID   chlorhexidine  15 mL Mouth Rinse BID   donepezil  10 mg Per Tube QHS   enoxaparin (LOVENOX) injection  40 mg Subcutaneous Q24H   feeding supplement (PROSource TF)   45 mL Per Tube Daily   free water  150 mL Per Tube Q6H   latanoprost  1 drop Both Eyes QHS   levETIRAcetam  500 mg Per Tube BID   mouth rinse  15 mL Mouth Rinse BID   memantine  10 mg Per Tube QHS   multivitamin  15 mL Per Tube Daily   Netarsudil Dimesylate  1 drop Both Eyes QHS   polyethylene glycol  17 g Per Tube BID   risperiDONE  0.5 mg Per Tube QHS   valproic acid  1,000 mg Per Tube Daily   valproic acid  500 mg Per Tube QHS   zinc oxide   Topical BID   Continuous Infusions:  feeding supplement (JEVITY 1.2 CAL) 1,000 mL (11/22/21 2245)     LOS: 4 days    Time spent: 35 mins.More than 50% of that time was spent in counseling and/or coordination of care.      Shelly Coss, MD [facility] P11/29/2022, 8:11 AM

## 2021-11-24 ENCOUNTER — Encounter (HOSPITAL_COMMUNITY): Payer: Self-pay | Admitting: Internal Medicine

## 2021-11-24 LAB — CULTURE, BLOOD (ROUTINE X 2)
Culture: NO GROWTH
Special Requests: ADEQUATE

## 2021-11-24 LAB — GLUCOSE, CAPILLARY
Glucose-Capillary: 113 mg/dL — ABNORMAL HIGH (ref 70–99)
Glucose-Capillary: 103 mg/dL — ABNORMAL HIGH (ref 70–99)
Glucose-Capillary: 108 mg/dL — ABNORMAL HIGH (ref 70–99)
Glucose-Capillary: 108 mg/dL — ABNORMAL HIGH (ref 70–99)
Glucose-Capillary: 107 mg/dL — ABNORMAL HIGH (ref 70–99)
Glucose-Capillary: 133 mg/dL — ABNORMAL HIGH (ref 70–99)
Glucose-Capillary: 93 mg/dL (ref 70–99)

## 2021-11-24 MED ORDER — IOHEXOL 300 MG/ML  SOLN: 75.0000 mL | Freq: Once | INTRAMUSCULAR | Status: DC

## 2021-11-24 MED ORDER — CEFAZOLIN SODIUM-DEXTROSE 2-4 GM/100ML-% IV SOLN: 2.0000 g | INTRAVENOUS | Status: DC

## 2021-11-24 NOTE — Progress Notes (Signed)
PROGRESS NOTE    [name] [name]  [id number] DOB: 06/05/1935 DOA: 11/18/2021 PCP: Raymondo Band, MD   Chief Complain:Lethargy  Brief Narrative: Patient is a 86 year old male with history of seizure, Advanced dementia ,nursing home resident who was discharged from here  2 weeks ago for COVID-pneumonia, found to have lethargy on 11:24 AM, per nursing staff patient is normally talkative, aaox1, will even sing to the snf RN, but he was noticed to have increased crackles and rhonchi required suctioning .  He was suspected to have recurrent aspiration pneumonia, so sent here.currently on antibiotics for aspiration pneumonia.  Mental status slowly improving but he is severely dysphasic requiring feeding tube.Speech therapy following .  After discussion with family, we decided to go ahead and put PEG tube.  IR consulted for that.  Assessment & Plan:   Principal Problem:   Acute encephalopathy Active Problems:   Essential hypertension   Dementia (HCC)   Seizure (HCC)   Pressure injury of skin   Recurrent pneumonia   Acute metabolic encephalopathy   Acute metabolic encephalopathy, Possibly from recurrent aspiration pneumonia -CT head no acute findings, ammonia level unremarkable, valproic acid level below low therapeutic -Currently on Augmentin. --Blood culture no growth, urine culture no growth, COVID screening negative -Sputum culture ordered on 11/24, pending collection -Feeding tube placed on 11/25 -Mental status has improved, currently he is awake and alert   Left Pleural effusion Monitor, small to moderate as per CT.  Given a dose of  IV Lasix x1, echo showed diastolic dysfunction There are no lower extremity edema Since respiratory status is stable, we will not do thoracentesis at this time   right upper extremity edema venous Doppler negative for  DVT Elevate right arm   History of seizure On Depakote and Keppra EEG :no seizure    Left hip pain Pelvic x-ray  concerning for nondisplaced left femur fracture, recommend CT for further evaluation , CT did not reveal fracture   BPH Oral meds held, cant be given through tube    FTT/Dysphagia/high risk for aspiration Frequent falls in the past when he was at home, he was discharge to snf on 11/11,  per SNF RN" patient was only able to sit on edge of bed, but could not get out of bed.  Has not walked since discharged on 11/11" Family interested  on PEG placement if needed.  Wife says is okay to go ahead and plan for PEG .  He has high risks of aspiration in the future.  We have put an order for IR guided PEG placement Plan is to discharge him back to skilled nursing facility after that.   Advanced dementia/from nursing home: Continue supportive care. Delirium  precautions  Pressure Injury 05/08/21 Coccyx Mid Stage 1 -  Intact skin with non-blanchable redness of a localized area usually over a bony prominence. (Active)  05/08/21 0515  Location: Coccyx  Location Orientation: Mid  Staging: Stage 1 -  Intact skin with non-blanchable redness of a localized area usually over a bony prominence.  Wound Description (Comments):   Present on Admission: Yes     Pressure Injury 10/25/21 Heel Left Deep Tissue Pressure Injury - Purple or maroon localized area of discolored intact skin or blood-filled blister due to damage of underlying soft tissue from pressure and/or shear. purple wound on heel (Active)  10/25/21 0800  Location: Heel  Location Orientation: Left  Staging: Deep Tissue Pressure Injury - Purple or maroon localized area of discolored intact skin or  blood-filled blister due to damage of underlying soft tissue from pressure and/or shear.  Wound Description (Comments): purple wound on heel  Present on Admission:      Pressure Injury 10/25/21 Heel Right Deep Tissue Pressure Injury - Purple or maroon localized area of discolored intact skin or blood-filled blister due to damage of underlying soft tissue  from pressure and/or shear. purple wound on heel (Active)  10/25/21 0800  Location: Heel  Location Orientation: Right  Staging: Deep Tissue Pressure Injury - Purple or maroon localized area of discolored intact skin or blood-filled blister due to damage of underlying soft tissue from pressure and/or shear.  Wound Description (Comments): purple wound on heel  Present on Admission:      Pressure Injury 11/18/21 Sacrum (Active)  11/18/21 2357  Location: Sacrum  Location Orientation:   Staging:   Wound Description (Comments):   Present on Admission: Yes        Nutrition Problem: Inadequate oral intake Etiology: inability to eat      DVT prophylaxis:Lovenox Code Status: Full Family Communication: Wife on phone on 11/23/21 Patient status:  Dispo: The patient is from: SNF              Anticipated d/c is to: SNF/long term care              Anticipated d/c date is: waiting for PEG placement  Consultants: Palliative care  Procedures: Feeding tube placement  Antimicrobials:  Anti-infectives (From admission, onward)    Start     Dose/Rate Route Frequency Ordered Stop   11/22/21 1400  amoxicillin-clavulanate (AUGMENTIN) 250-62.5 MG/5ML suspension 500 mg        500 mg Per Tube Every 8 hours 11/22/21 1037 11/26/21 0559   11/19/21 1800  Ampicillin-Sulbactam (UNASYN) 3 g in sodium chloride 0.9 % 100 mL IVPB  Status:  Discontinued        3 g 200 mL/hr over 30 Minutes Intravenous Every 6 hours 11/19/21 1644 11/22/21 1036   11/19/21 0100  cefTRIAXone (ROCEPHIN) 2 g in sodium chloride 0.9 % 100 mL IVPB  Status:  Discontinued        2 g 200 mL/hr over 30 Minutes Intravenous Every 24 hours 11/18/21 2358 11/19/21 1621   11/19/21 0100  azithromycin (ZITHROMAX) 500 mg in sodium chloride 0.9 % 250 mL IVPB  Status:  Discontinued        500 mg 250 mL/hr over 60 Minutes Intravenous Every 24 hours 11/18/21 2358 11/19/21 1621   11/18/21 2145  vancomycin (VANCOREADY) IVPB 1500 mg/300 mL  Status:   Discontinued        1,500 mg 150 mL/hr over 120 Minutes Intravenous  Once 11/18/21 2137 11/19/21 0023   11/18/21 2145  ceFEPIme (MAXIPIME) 2 g in sodium chloride 0.9 % 100 mL IVPB  Status:  Discontinued        2 g 200 mL/hr over 30 Minutes Intravenous  Once 11/18/21 2138 11/19/21 0007   11/18/21 2130  ceFEPIme (MAXIPIME) 1 g in sodium chloride 0.9 % 100 mL IVPB  Status:  Discontinued        1 g 200 mL/hr over 30 Minutes Intravenous  Once 11/18/21 2125 11/18/21 2138       Subjective:  Patient seen and examined at the bedside this morning.  Hemodynamically stable.  He looks alert and awake today and was opening his eyes.  He engaged in the conversation.  Confused to date and time.  Not in any kind of distress  Objective: Vitals:  11/23/21 0439 11/23/21 1316 11/23/21 2106 11/24/21 0433  BP:  136/69 138/89 (!) 146/77  Pulse:  (!) 58 62 68  Resp:  19 16 18   Temp:  (!) 97.5 F (36.4 C) 98.1 F (36.7 C) 98.2 F (36.8 C)  TempSrc:  Axillary Oral Oral  SpO2:  98% 100% 100%  Weight: 78 kg     Height:        Intake/Output Summary (Last 24 hours) at 11/24/2021 0757 Last data filed at 11/24/2021 0500 Gross per 24 hour  Intake 700 ml  Output 1000 ml  Net -300 ml   Filed Weights   11/21/21 0500 11/22/21 0600 11/23/21 0439  Weight: 76 kg 77.7 kg 78 kg    Examination:   General exam: Very deconditioned, debilitated, chronically ill looking HEENT: Feeding tube Respiratory system:  no wheezes or crackles  Cardiovascular system: S1 & S2 heard, RRR.  Gastrointestinal system: Abdomen is nondistended, soft and nontender. Central nervous system: Alert and awake but not oriented to time Extremities: Edema of the right upper extremity, no clubbing ,no cyanosis Skin: No rashes,no icterus     Data Reviewed: I have personally reviewed following labs and imaging studies  CBC: Recent Labs  Lab 11/18/21 1904 11/19/21 0018 11/19/21 0128 11/20/21 0332 11/22/21 0401  WBC 9.5 8.9  8.6 4.0 4.4  NEUTROABS 7.4  --   --   --  2.4  HGB 12.4* 12.7* 12.9* 10.5* 11.1*  HCT 40.5 42.1 41.8 33.9* 35.2*  MCV 87.7 88.3 88.7 89.0 85.6  PLT 299 227 254 206 178   Basic Metabolic Panel: Recent Labs  Lab 11/18/21 1926 11/19/21 0018 11/19/21 0128 11/19/21 1901 11/20/21 0332 11/20/21 1706 11/21/21 0350 11/22/21 0401  NA 141  --  140  --  140  --  141 141  K 4.3  --  4.2  --  3.8  --  3.7 4.0  CL 103  --  104  --  106  --  106 105  CO2 29  --  29  --  28  --  30 32  GLUCOSE 114*  --  103*  --  82  --  85 123*  BUN 17  --  17  --  16  --  13 14  CREATININE 0.72 0.83 0.76  --  0.74  --  0.69 0.61  CALCIUM 8.9  --  8.9  --  8.4*  --  8.6* 8.2*  MG  --   --   --  2.0 1.8 2.0 2.0 2.1  PHOS  --   --   --  4.0 3.6 3.9 3.7  --    GFR: Estimated Creatinine Clearance: 75.8 mL/min (by C-G formula based on SCr of 0.61 mg/dL). Liver Function Tests: Recent Labs  Lab 11/18/21 1926 11/19/21 0128  AST 25 22  ALT 17 15  ALKPHOS 59 59  BILITOT 0.2* 0.3  PROT 6.1* 6.0*  ALBUMIN 2.7* 2.7*   No results for input(s): LIPASE, AMYLASE in the last 168 hours. Recent Labs  Lab 11/19/21 0018  AMMONIA 21   Coagulation Profile: Recent Labs  Lab 11/18/21 1904  INR 1.0   Cardiac Enzymes: No results for input(s): CKTOTAL, CKMB, CKMBINDEX, TROPONINI in the last 168 hours. BNP (last 3 results) No results for input(s): PROBNP in the last 8760 hours. HbA1C: No results for input(s): HGBA1C in the last 72 hours. CBG: Recent Labs  Lab 11/23/21 1711 11/23/21 2028 11/24/21 0001 11/24/21 0426 11/24/21 0725  GLUCAP  99 103* 113* 103* 108*   Lipid Profile: No results for input(s): CHOL, HDL, LDLCALC, TRIG, CHOLHDL, LDLDIRECT in the last 72 hours. Thyroid Function Tests: No results for input(s): TSH, T4TOTAL, FREET4, T3FREE, THYROIDAB in the last 72 hours. Anemia Panel: No results for input(s): VITAMINB12, FOLATE, FERRITIN, TIBC, IRON, RETICCTPCT in the last 72 hours. Sepsis  Labs: Recent Labs  Lab 11/18/21 1904 11/19/21 0018 11/19/21 0834 11/20/21 0332 11/21/21 0350  PROCALCITON  --   --  <0.10 <0.10 <0.10  LATICACIDVEN 1.7 1.1  --   --   --     Recent Results (from the past 240 hour(s))  Blood Culture (routine x 2)     Status: None   Collection Time: 11/18/21  7:04 PM   Specimen: BLOOD  Result Value Ref Range Status   Specimen Description   Final    BLOOD BLOOD RIGHT HAND Performed at [facility], [place] [address] [place], [place] [address]    Special Requests   Final    BOTTLES DRAWN AEROBIC AND ANAEROBIC Blood Culture results may not be optimal due to an inadequate volume of blood received in culture bottles Performed at [place] [address] [place], [place] [address]    Culture   Final    NO GROWTH 5 DAYS Performed at [facility], [place] [address], [place], [place] [address]    Report Status 11/23/2021 FINAL  Final  Urine Culture     Status: None   Collection Time: 11/18/21  7:04 PM   Specimen: In/Out Cath Urine  Result Value Ref Range Status   Specimen Description   Final    IN/OUT CATH URINE Performed at [place] [address] [place], [place] [address]    Special Requests   Final    NONE Performed at [facility], [place] [address], [place], [place] [address]    Culture   Final    NO GROWTH Performed at [facility], [place] [address] [place],  [address]    Report Status 11/20/2021 FINAL  Final  Resp Panel by RT-PCR (Flu A&B, Covid) Urine, Clean Catch     Status: None   Collection Time: 11/18/21  7:18 PM   Specimen: Urine, Clean Catch; Nasopharyngeal(NP) swabs in vial transport medium  Result Value Ref Range Status   SARS Coronavirus 2 by RT PCR NEGATIVE NEGATIVE Final    Comment: (NOTE) SARS-CoV-2 target nucleic acids are NOT DETECTED.  The SARS-CoV-2 RNA is generally detectable in upper respiratory specimens during the  acute phase of infection. The lowest concentration of SARS-CoV-2 viral copies this assay can detect is 138 copies/mL. A negative result does not preclude SARS-Cov-2 infection and should not be used as the sole basis for treatment or other patient management decisions. A negative result may occur with  improper specimen collection/handling, submission of specimen other than nasopharyngeal swab, presence of viral mutation(s) within the areas targeted by this assay, and inadequate number of viral copies(<138 copies/mL). A negative result must be combined with clinical observations, patient history, and epidemiological information. The expected result is Negative.  Fact Sheet for Patients:  EntrepreneurPulse.com.au  Fact Sheet for Healthcare Providers:  IncredibleEmployment.be  This test is no t yet approved or cleared by the [place] FDA and  has been authorized for detection and/or diagnosis of SARS-CoV-2 by FDA under an Emergency Use Authorization (EUA). This EUA will remain  in effect (meaning this test can be  used) for the duration of the COVID-19 declaration under Section 564(b)(1) of the Act, 21 U.S.C.section 360bbb-3(b)(1), unless the authorization is terminated  or revoked sooner.       Influenza A by PCR NEGATIVE NEGATIVE Final   Influenza B by PCR NEGATIVE NEGATIVE Final    Comment: (NOTE) The Xpert Xpress SARS-CoV-2/FLU/RSV plus assay is intended as an aid in the diagnosis of influenza from Nasopharyngeal swab specimens and should not be used as a sole basis for treatment. Nasal washings and aspirates are unacceptable for Xpert Xpress SARS-CoV-2/FLU/RSV testing.  Fact Sheet for Patients: EntrepreneurPulse.com.au  Fact Sheet for Healthcare Providers: IncredibleEmployment.be  This test is not yet approved or cleared by the [place] FDA and has been authorized for detection and/or  diagnosis of SARS-CoV-2 by FDA under an Emergency Use Authorization (EUA). This EUA will remain in effect (meaning this test can be used) for the duration of the COVID-19 declaration under Section 564(b)(1) of the Act, 21 U.S.C. section 360bbb-3(b)(1), unless the authorization is terminated or revoked.  Performed at [facility], [place] [address] [place], [place] [address]   Blood Culture (routine x 2)     Status: None   Collection Time: 11/19/21 12:18 AM   Specimen: BLOOD LEFT ARM  Result Value Ref Range Status   Specimen Description   Final    BLOOD LEFT ARM Performed at [place] [address], [place], [place] [address]    Special Requests   Final    BOTTLES DRAWN AEROBIC ONLY Blood Culture adequate volume Performed at [facility] [place] [address], [place], [place] [address]    Culture   Final    NO GROWTH 5 DAYS Performed at [facility], [place] [address], [place], [place] [address]    Report Status 11/24/2021 FINAL  Final         Radiology Studies: CT ABDOMEN PELVIS WO CONTRAST  Result Date: 11/23/2021 CLINICAL DATA:  Preoperative evaluation for gastric tube placement. EXAM: CT ABDOMEN AND PELVIS WITHOUT CONTRAST TECHNIQUE: Multidetector CT imaging of the abdomen and pelvis was performed following the standard protocol without IV contrast. COMPARISON:  08/20/2014.  05/25/2012. FINDINGS: Lower chest: Bilateral pleural effusions larger on the left than the right with dependent atelectasis and or infiltrate, worse in the left lower lobe than the right lower lobe. Cardiomegaly. Small amount of pericardial fluid. Hepatobiliary: Liver appears normal without contrast. No calcified gallstones. Pancreas: Normal Spleen: Normal Adrenals/Urinary Tract: Adrenal glands are normal. Left kidney is normal. Right kidney shows a 17 mm calcified mass in the ventral upper pole. This is not enlarged and is actually slightly smaller  than was seen in 2013 and 2015. This was felt previously to represent a benign calcified hemorrhagic or proteinaceous cyst, and the follow-up would be consistent with that diagnosis. No progressive or worrisome finding. Bladder is thick-walled. Stomach/Bowel: Nasogastric tube enters the stomach. Stomach appears normally positioned. There is no interposed colon presently between the body and antrum of the stomach and the anterior abdominal wall. The inferior aspect of the left lobe does overlie the lesser curvature region. No small bowel pathology is seen. Question the presence of a rectal mass. Digital examination recommended. Vascular/Lymphatic: Aortic atherosclerosis. No aneurysm. IVC is normal. No adenopathy. Reproductive: Normal except for enlarged prostate. Other: No free fluid or air. Small left inguinal hernia containing only fat. Musculoskeletal: This chronic degenerative changes affect the spine. IMPRESSION: Bilateral pleural effusions, larger on the left than the right,  with atelectasis in both lower lobes, left worse than right. Cardiomegaly.  Pericardial effusion. No unexpected gastric anatomic findings. No interposed colon between the anterior wall of the body and antrum of the stomach and the anterior abdominal wall. Chronic 1.7 cm calcified region at the upper pole the right kidney, actually smaller than seen 79 years ago, consistent with a benign entity. See above discussion. Enlarged prostate. Question rectal mass versus proctitis. Thick-walled bladder suggesting cystitis or chronic outlet obstruction. Small left inguinal hernia containing only fat. Electronically Signed   By: Nelson Chimes M.D.   On: 11/23/2021 19:08   DG Swallowing Func-Speech Pathology  Result Date: 11/22/2021 Table formatting from the original result was not included. Objective Swallowing Evaluation: Type of Study: MBS-Modified Barium Swallow Study  Patient Details Name: ROSEMARIE MAURICIO MRN: [id number] Date of Birth:  05-24-1935 Today's Date: 11/22/2021 Time: SLP Start Time (ACUTE ONLY): 1345 -SLP Stop Time (ACUTE ONLY): [id number] SLP Time Calculation (min) (ACUTE ONLY): 20 min Past Medical History: Past Medical History: Diagnosis Date  Allergy   Dementia ([place])   sees Dr. Ellouise Newer   Diverticulosis of colon (without mention of hemorrhage) 03-01-2004, 04-04-2011  Colonoscopy  ED (erectile dysfunction)   Glaucoma   sees Dr. Crecencio Mc   Hyperglycemia   Hyperlipidemia   Hypertension   Internal hemorrhoids 03-15-1999  Flex Sig   Irritable bowel  Past Surgical History: Past Surgical History: Procedure Laterality Date  COLONOSCOPY  04-04-11  per Dr. Sharlett Iles, clear, no repeats needed  EYE SURGERY    bilateral cataract extraction per Dr Dolores Lory  INGUINAL HERNIA REPAIR    KNEE ARTHROSCOPY    right knee  NASAL SINUS SURGERY   HPI: ANZEL MATHIASON is a 86 y.o. male with known history of dementia, seizures, BPH, decubitus ulcers, pericardial effusion anemia was recently admitted for pneumonia discharged about 2 weeks ago was brought to the ER after patient was found to be increasingly confused and lethargic. CT chest was showing bilateral infiltrates concerning for pneumonia also was showing mild pericardial effusion. Most recent MBS 10/28/21 recommending Dys 2/honey thick and pt was able to upgrade to Dys 2/nectar prior to discharge.  Subjective: lethargic but able to particiate in minimal amount of PO intake and follow basic directions  Recommendations for follow up therapy are one component of a multi-disciplinary discharge planning process, led by the attending physician.  Recommendations may be updated based on patient status, additional functional criteria and insurance authorization. Assessment / Plan / Recommendation Clinical Impressions 11/22/2021 Clinical Impression Patient presents with a mild oral and a severe pharyngeal phase dysphagia which is significantly declined as compared to MBS documented from 3 weeks ago. NG feeding tube in  place during today's study and was in place during prior MBS as well. During today's MBS, patient was lethargic and required cues to maintain alertness. He was able to follow basic directions to swallow again, cough. First PO administered was puree texture and majority of this bolus become retained in vallecular and pyriform sinus. He exhibited one instance of sensed aspiration (PAS 7) of moderate amount of puree which occured after the swallow and was from pyriform sinus residuals. Several other instances of aspiration occured, all of which were during or after swallows and all were silent. (PAS 8). SLP then gave patient spoon sip of nectar thick liquids, followed by spoon sip of thin liquid barium, both of which did not aid in reducing vallecular and sinus residuals. Very small amount of barium contrast would transit  through UES with each swallow. Patient was able to cough and transit penetrate, aspirate out of laryngeal vestibule, but not consistently. There was a mild reduction in amount of vallecular and pyriform sinus residuals with multiple cued cough/throat clear and reswallows, dry swallows, however unable to achieve full clearance of pharyngeal residuals by end of study. SLP is recommending continue with NPO status and prognosis of PO advancement is guarded at this time. SLP Visit Diagnosis Dysphagia, oropharyngeal phase (R13.12) Attention and concentration deficit following -- Frontal lobe and executive function deficit following -- Impact on safety and function Severe aspiration risk;Risk for inadequate nutrition/hydration   Treatment Recommendations 11/22/2021 Treatment Recommendations Therapy as outlined in treatment plan below   Prognosis 11/22/2021 Prognosis for Safe Diet Advancement Guarded Barriers to Reach Goals Severity of deficits;Cognitive deficits Barriers/Prognosis Comment -- Diet Recommendations 11/22/2021 SLP Diet Recommendations NPO Liquid Administration via -- Medication Administration  Via alternative means Compensations -- Postural Changes --   Other Recommendations 11/22/2021 Recommended Consults -- Oral Care Recommendations Oral care QID;Staff/trained caregiver to provide oral care Other Recommendations -- Follow Up Recommendations Skilled nursing-short term rehab (<3 hours/day) Assistance recommended at discharge Frequent or constant Supervision/Assistance Functional Status Assessment Patient has had a recent decline in their functional status and/or demonstrates limited ability to make significant improvements in function in a reasonable and predictable amount of time Frequency and Duration  11/22/2021 Speech Therapy Frequency (ACUTE ONLY) min 1 x/week Treatment Duration 1 week   Oral Phase 11/22/2021 Oral Phase Impaired Oral - Pudding Teaspoon -- Oral - Pudding Cup -- Oral - Honey Teaspoon -- Oral - Honey Cup NT Oral - Nectar Teaspoon Delayed oral transit;Reduced posterior propulsion Oral - Nectar Cup NT Oral - Nectar Straw NT Oral - Thin Teaspoon Delayed oral transit;Reduced posterior propulsion Oral - Thin Cup NT Oral - Thin Straw NT Oral - Puree Delayed oral transit Oral - Mech Soft -- Oral - Regular NT Oral - Multi-Consistency -- Oral - Pill NT Oral Phase - Comment --  Pharyngeal Phase 11/22/2021 Pharyngeal Phase Impaired Pharyngeal- Pudding Teaspoon -- Pharyngeal -- Pharyngeal- Pudding Cup -- Pharyngeal -- Pharyngeal- Honey Teaspoon NT Pharyngeal -- Pharyngeal- Honey Cup NT Pharyngeal -- Pharyngeal- Nectar Teaspoon Reduced anterior laryngeal mobility;Reduced airway/laryngeal closure;Penetration/Apiration after swallow;Moderate aspiration;Pharyngeal residue - valleculae;Pharyngeal residue - pyriform Pharyngeal -- Pharyngeal- Nectar Cup NT Pharyngeal -- Pharyngeal- Nectar Straw -- Pharyngeal -- Pharyngeal- Thin Teaspoon Delayed swallow initiation-vallecula;Reduced anterior laryngeal mobility;Reduced airway/laryngeal closure;Moderate aspiration;Penetration/Apiration after  swallow;Penetration/Aspiration during swallow;Pharyngeal residue - valleculae;Pharyngeal residue - pyriform Pharyngeal -- Pharyngeal- Thin Cup NT Pharyngeal -- Pharyngeal- Thin Straw NT Pharyngeal -- Pharyngeal- Puree Delayed swallow initiation-vallecula;Penetration/Apiration after swallow;Moderate aspiration;Pharyngeal residue - pyriform;Pharyngeal residue - valleculae;Reduced anterior laryngeal mobility;Reduced airway/laryngeal closure Pharyngeal -- Pharyngeal- Mechanical Soft -- Pharyngeal -- Pharyngeal- Regular NT Pharyngeal -- Pharyngeal- Multi-consistency -- Pharyngeal -- Pharyngeal- Pill NT Pharyngeal -- Pharyngeal Comment --  Cervical Esophageal Phase  11/22/2021 Cervical Esophageal Phase Impaired Pudding Teaspoon -- Pudding Cup -- Honey Teaspoon -- Honey Cup -- Nectar Teaspoon Reduced cricopharyngeal relaxation Nectar Cup -- Nectar Straw -- Thin Teaspoon Reduced cricopharyngeal relaxation Thin Cup Reduced cricopharyngeal relaxation Thin Straw -- Puree Reduced cricopharyngeal relaxation Mechanical Soft -- Regular -- Multi-consistency -- Pill -- Cervical Esophageal Comment -- Angela Nevin, MA, CCC-SLP Speech Therapy                          Scheduled Meds:  amoxicillin-clavulanate  500 mg Per Tube Q8H   vitamin C  250 mg  Per Tube Daily   brinzolamide  1 drop Both Eyes TID   chlorhexidine  15 mL Mouth Rinse BID   donepezil  10 mg Per Tube QHS   enoxaparin (LOVENOX) injection  40 mg Subcutaneous Q24H   feeding supplement (PROSource TF)  45 mL Per Tube Daily   free water  150 mL Per Tube Q6H   latanoprost  1 drop Both Eyes QHS   levETIRAcetam  500 mg Per Tube BID   mouth rinse  15 mL Mouth Rinse BID   memantine  10 mg Per Tube QHS   multivitamin  15 mL Per Tube Daily   Netarsudil Dimesylate  1 drop Both Eyes QHS   polyethylene glycol  17 g Per Tube BID   risperiDONE  0.5 mg Per Tube QHS   valproic acid  1,000 mg Per Tube Daily   valproic acid  500 mg Per Tube QHS   zinc oxide    Topical BID   Continuous Infusions:  feeding supplement (JEVITY 1.2 CAL) 1,000 mL (11/24/21 0650)     LOS: 5 days    Time spent: 25 mins.More than 50% of that time was spent in counseling and/or coordination of care.      Shelly Coss, MD [facility] P11/30/2022, 7:57 AM

## 2021-11-24 NOTE — Consult Note (Signed)
Chief Complaint: Patient was seen in consultation today for G-tube placement at the request of Dr. Shelly Coss   Referring Physician(s): Dr. Shelly Coss   Supervising Physician: Mir, Sharen Heck  Patient Status: [facility] - In-pt  History of Present Illness: [name] [name] is a 86 y.o. male with PMHs of seizure, Advanced dementia , recent COVID-pneumonia required hospitalization, who was sent to [facility] ED by nursing home staff due to confusion and pain lethargic.   CT chest showed bilateral infiltrates concerning for pneumonia and while pericardial effusion.  Patient was hospitalized for further evaluation and management. Patient underwent NG tube placement due to poor oral intake, and has been evaluated by SLP, patient showed severe pharyngeal dysphagia with notable aspiration of pured consistency, recommendation was made to continue alternate means of nutrition, hydration and medication.  Due to severe dysphagia with notable aspiration, a G-tube placement was recommended to the patient and his family members.   IR was requested for image guided gastrostomy tube placement. CT review Case was reviewed and approved by Dr. Dwaine Gale.  Patient seen in his room, he is laying in his bed sleeping, minimally arousable to verbal stimuli. Wife at bedside, states that he sleeps all the time.  Discussed the G-tube placement and aftercare with the wife, she states that she understands the need of the long-term feeding tube; however, she is unsure that the patient can have the G-tube at the moment as she is concerned that the skilled nursing facility will not take good care of the G-tube.  She is concerned about the complications such as infection and dislodgment after the G-tube placement, she states that she would like to talk to her son to discuss G-tube placement and she will let IR know if she wants to proceed with the G-tube placement tomorrow. She asked this PA to call the son and inform him about the  possible complications after the G-tube placement.  Son, Mr. [name] [name] called and informed about the possible complications after the G-tube placement which includes infection, leakage around the G-tube exit site, and dislodgment.  Son states that he will talk to his mother tonight to determine if they want proceed with the G-tube placement.  Past Medical History:  Diagnosis Date   Allergy    Dementia [facility])    sees Dr. Ellouise Newer    Diverticulosis of colon (without mention of hemorrhage) 03-01-2004, 04-04-2011   Colonoscopy   ED (erectile dysfunction)    Glaucoma    sees Dr. Crecencio Mc    Hyperglycemia    Hyperlipidemia    Hypertension    Internal hemorrhoids 03-15-1999   Flex Sig    Irritable bowel     Past Surgical History:  Procedure Laterality Date   COLONOSCOPY  04-04-11   per Dr. Sharlett Iles, clear, no repeats needed   EYE SURGERY     bilateral cataract extraction per Dr Dolores Lory   INGUINAL HERNIA REPAIR     KNEE ARTHROSCOPY     right knee   NASAL SINUS SURGERY      Allergies: Chocolate, Ciprofloxacin, and Lipitor [atorvastatin]  Medications: Prior to Admission medications   Medication Sig Start Date End Date Taking? Authorizing Provider  acetaminophen (TYLENOL) 500 MG tablet Take 500 mg by mouth 3 (three) times daily.   Yes [provider]  aluminum-magnesium hydroxide-simethicone (MAALOX) 200-200-20 MG/5ML SUSP Take 30 mLs by mouth every 4 (four) hours as needed (for indigestion).   Yes [provider]  AZOPT 1 % ophthalmic suspension Place  1 drop into both eyes 3 (three) times daily. 12/18/19  Yes [provider]  Coenzyme Q10 (CO Q 10 PO) Take 30 mg by mouth daily.   Yes [provider]  divalproex (DEPAKOTE) 500 MG DR tablet Take 500-1,000 mg by mouth See admin instructions. Give 1000mg  by mouth in the morning and 500mg  at bedtime 11/09/21  Yes [provider]  donepezil (ARICEPT) 10 MG tablet Take 10 mg by mouth at  bedtime. 03/06/21  Yes [provider]  fish oil-omega-3 fatty acids 1000 MG capsule Take 1 g by mouth daily.   Yes [provider]  Glucosamine-Chondroitin 500-400 MG CAPS Take 1 tablet by mouth daily.   Yes [provider]  guaiFENesin (ROBITUSSIN) 100 MG/5ML liquid Take 15 mLs by mouth every 4 (four) hours as needed for cough or to loosen phlegm.   Yes [provider]  latanoprost (XALATAN) 0.005 % ophthalmic solution Place 1 drop into both eyes at bedtime.   Yes [provider]  levETIRAcetam (KEPPRA) 500 MG tablet TAKE 1 TABLET (500 MG TOTAL) BY MOUTH TWO TIMES DAILY. Patient taking differently: Take 500 mg by mouth 2 (two) times daily. 03/11/21 03/11/22 Yes Eugenie Filler, MD  loperamide (IMODIUM) 2 MG capsule Take 2 mg by mouth as needed for diarrhea or loose stools (DO NOT EXCEED 8 CAPSULES IN 24HRS).   Yes [provider]  magnesium hydroxide (MILK OF MAGNESIA) 400 MG/5ML suspension Take 30 mLs by mouth daily as needed for mild constipation.   Yes [provider]  melatonin 5 MG TABS Take 10 mg by mouth at bedtime.   Yes [provider]  memantine (NAMENDA) 10 MG tablet Take 1 tablet twice a day Patient taking differently: Take 10 mg by mouth at bedtime. 03/22/21  Yes Cameron Sprang, MD  MILK THISTLE PO Take 140 mg by mouth daily.   Yes [provider]  Multiple Vitamins-Minerals (MULTIVITAMIN WITH MINERALS) tablet Take 1 tablet by mouth daily.   Yes [provider]  polyethylene glycol (MIRALAX / GLYCOLAX) 17 g packet Take 17 g by mouth daily.   Yes [provider]  promethazine (PHENERGAN) 25 MG tablet Take 25 mg by mouth every 4 (four) hours as needed for nausea or vomiting.   Yes [provider]  RHOPRESSA 0.02 % SOLN Place 1 drop into both eyes at bedtime. 08/17/19  Yes [provider]  risperiDONE (RISPERDAL) 0.5 MG tablet Take 1 tablet (0.5 mg total) by mouth at  bedtime. 02/25/20  Yes Laurey Morale, MD  senna (SENOKOT) 8.6 MG TABS tablet Take 2 tablets by mouth at bedtime.   Yes [provider]  tamsulosin (FLOMAX) 0.4 MG CAPS capsule Take 0.4 mg by mouth every other day.   Yes [provider]  vitamin C (ASCORBIC ACID) 250 MG tablet Take 250 mg by mouth daily.   Yes [provider]     Family History  Problem Relation Age of Onset   Hypertension Mother    Lung cancer Father    Colitis Daughter     Social History   Socioeconomic History   Marital status: Married    Spouse name: Not on file   Number of children: 2   Years of education: 11   Highest education level: Not on file  Occupational History   Occupation: retired    Fish farm manager: RETIRED  Tobacco Use   Smoking status: Former    Types: Cigarettes    Quit date:  03/20/1969    Years since quitting: 52.7   Smokeless tobacco: Never  Vaping Use   Vaping Use: Never used  Substance and Sexual Activity   Alcohol use: Yes    Alcohol/week: 0.0 standard drinks    Comment: occ   Drug use: No   Sexual activity: Not on file  Other Topics Concern   Not on file  Social History Narrative   Right handed   Drinks caffeine   One story home   Social Determinants of Health   Financial Resource Strain: Not on file  Food Insecurity: Not on file  Transportation Needs: Not on file  Physical Activity: Not on file  Stress: Not on file  Social Connections: Not on file     Review of Systems: A 12 point ROS discussed and pertinent positives are indicated in the HPI above.  All other systems are negative.  Vital Signs: BP (!) 143/81 (BP Location: Left Arm)   Pulse 71   Temp 98 F (36.7 C) (Oral)   Resp 19   Ht 6\' 1"  (1.854 m)   Wt 171 lb 15.3 oz (78 kg)   SpO2 98%   BMI 22.69 kg/m    Physical Exam Vitals reviewed.  Constitutional:      General: He is not in acute distress.    Appearance: He is ill-appearing.     Comments: Appears lethargic, minimally  responsive to verbal stimuli he is able to nod.  HENT:     Head: Normocephalic and atraumatic.     Nose:     Comments: NG tube in right nare    Mouth/Throat:     Mouth: Mucous membranes are moist.  Cardiovascular:     Rate and Rhythm: Normal rate and regular rhythm.     Heart sounds: Normal heart sounds.  Pulmonary:     Effort: Pulmonary effort is normal.     Comments: Crackles heard on bilateral lower lobes Abdominal:     General: Abdomen is flat. Bowel sounds are normal.     Palpations: Abdomen is soft.  Skin:    General: Skin is warm and dry.     Coloration: Skin is not jaundiced or pale.  Neurological:     Comments: Lethargic, minimally arousable to verbal stimuli, to assess neuro status    MD Evaluation Airway: WNL Heart: WNL Abdomen: WNL Chest/ Lungs: WNL ASA  Classification: 3 Mallampati/Airway Score: Two  Imaging: CT ABDOMEN PELVIS WO CONTRAST  Result Date: 11/23/2021 CLINICAL DATA:  Preoperative evaluation for gastric tube placement. EXAM: CT ABDOMEN AND PELVIS WITHOUT CONTRAST TECHNIQUE: Multidetector CT imaging of the abdomen and pelvis was performed following the standard protocol without IV contrast. COMPARISON:  08/20/2014.  05/25/2012. FINDINGS: Lower chest: Bilateral pleural effusions larger on the left than the right with dependent atelectasis and or infiltrate, worse in the left lower lobe than the right lower lobe. Cardiomegaly. Small amount of pericardial fluid. Hepatobiliary: Liver appears normal without contrast. No calcified gallstones. Pancreas: Normal Spleen: Normal Adrenals/Urinary Tract: Adrenal glands are normal. Left kidney is normal. Right kidney shows a 17 mm calcified mass in the ventral upper pole. This is not enlarged and is actually slightly smaller than was seen in 2013 and 2015. This was felt previously to represent a benign calcified hemorrhagic or proteinaceous cyst, and the follow-up would be consistent with that diagnosis. No progressive or  worrisome finding. Bladder is thick-walled. Stomach/Bowel: Nasogastric tube enters the stomach. Stomach appears normally positioned. There is no interposed colon presently between the  body and antrum of the stomach and the anterior abdominal wall. The inferior aspect of the left lobe does overlie the lesser curvature region. No small bowel pathology is seen. Question the presence of a rectal mass. Digital examination recommended. Vascular/Lymphatic: Aortic atherosclerosis. No aneurysm. IVC is normal. No adenopathy. Reproductive: Normal except for enlarged prostate. Other: No free fluid or air. Small left inguinal hernia containing only fat. Musculoskeletal: This chronic degenerative changes affect the spine. IMPRESSION: Bilateral pleural effusions, larger on the left than the right, with atelectasis in both lower lobes, left worse than right. Cardiomegaly.  Pericardial effusion. No unexpected gastric anatomic findings. No interposed colon between the anterior wall of the body and antrum of the stomach and the anterior abdominal wall. Chronic 1.7 cm calcified region at the upper pole the right kidney, actually smaller than seen 79 years ago, consistent with a benign entity. See above discussion. Enlarged prostate. Question rectal mass versus proctitis. Thick-walled bladder suggesting cystitis or chronic outlet obstruction. Small left inguinal hernia containing only fat. Electronically Signed   By: Nelson Chimes M.D.   On: 11/23/2021 19:08   DG Pelvis 1-2 Views  Result Date: 11/19/2021 CLINICAL DATA:  Unresponsiveness. EXAM: PELVIS - 1-2 VIEW COMPARISON:  None. FINDINGS: Evaluation is limited on the provided images. Faint linear subcapital lucency through the neck of the left femur concerning for a nondisplaced fracture. Dedicated left hip radiograph or CT is recommended for better evaluation. No dislocation. The bones are osteopenic. Degenerative changes of the lumbar spine. The soft tissues are unremarkable.  IMPRESSION: Findings concerning for a nondisplaced left femoral neck fracture. Dedicated left hip radiograph or CT is recommended for better evaluation. Electronically Signed   By: Anner Crete M.D.   On: 11/19/2021 03:41   DG Abd 1 View  Result Date: 11/21/2021 CLINICAL DATA:  NG placement. EXAM: ABDOMEN - 1 VIEW COMPARISON:  Earlier radiograph dated 11/19/2021. FINDINGS: Feeding tube with weighted tip in the upper abdomen likely in the proximal stomach. Small left and possibly trace right pleural effusion. There is bibasilar atelectasis or infiltrate. Bilateral hazy and interstitial densities may represent edema. Pneumonia is not excluded. There is cardiomegaly. No acute osseous pathology. IMPRESSION: Feeding tube with weighted tip in the proximal stomach. Electronically Signed   By: Anner Crete M.D.   On: 11/21/2021 03:57   CT HEAD WO CONTRAST (5MM)  Result Date: 11/18/2021 CLINICAL DATA:  Delirium. EXAM: CT HEAD WITHOUT CONTRAST TECHNIQUE: Contiguous axial images were obtained from the base of the skull through the vertex without intravenous contrast. COMPARISON:  10/25/2021. FINDINGS: Brain: No acute intracranial hemorrhage, midline shift or mass effect. Generalized atrophy is noted. Subcortical and periventricular white matter hypodensities are seen bilaterally. There is no hydrocephalus. Vascular: No hyperdense vessel or unexpected calcification. Skull: Normal. Negative for fracture or focal lesion. Sinuses/Orbits: Round density is present in the left maxillary sinus, possible mucosal retention cyst or polyp. There is partial opacification of the ethmoid air cells bilaterally. The orbits are stable. Other: None. IMPRESSION: Stable head CT with no acute intracranial process. Electronically Signed   By: Brett Fairy M.D.   On: 11/18/2021 20:48   CT Chest Wo Contrast  Result Date: 11/18/2021 CLINICAL DATA:  Chest pain, shortness of breath. Altered mental status. Recent pneumonia. Recent  COVID positive. EXAM: CT CHEST WITHOUT CONTRAST TECHNIQUE: Multidetector CT imaging of the chest was performed following the standard protocol without IV contrast. COMPARISON:  None. FINDINGS: Cardiovascular: Mild cardiomegaly. Small pericardial effusion is present. Calcified plaque over  the left anterior descending and lateral circumflex coronary arteries. No evidence of thoracic aortic aneurysm. Calcified plaque is present over the thoracic aorta. Remaining vascular structures are unremarkable. Mediastinum/Nodes: No definite mediastinal or hilar adenopathy. Remaining mediastinal structures are unremarkable. Lungs/Pleura: Lungs are adequately inflated and demonstrate patchy hazy opacification over the mid to lower lungs bilaterally. Consolidation over the left base likely compressive atelectasis with small to moderate left effusion. Airways are normal. Upper Abdomen: Minimal calcified plaque over the abdominal aorta. Oval partially calcified rounded mass over the upper pole right kidney slightly smaller compared to 2015. No acute findings. Musculoskeletal: Degenerative change of the spine. No focal abnormality. IMPRESSION: 1. Patchy bilateral hazy airspace process which may be due to infection bacterial or viral origin. Small to moderate left pleural effusion with associated left basilar consolidation likely compressive atelectasis. 2. Mild cardiomegaly with small pericardial effusion. Atherosclerotic coronary artery disease. 3. Aortic atherosclerosis. 4. Oval partially calcified rounded mass over the upper pole right kidney slightly smaller compared to 2015. Aortic Atherosclerosis (ICD10-I70.0). Electronically Signed   By: Marin Olp M.D.   On: 11/18/2021 20:52   CT PELVIS WO CONTRAST  Result Date: 11/19/2021 CLINICAL DATA:  Left femur fracture. EXAM: CT PELVIS WITHOUT CONTRAST TECHNIQUE: Multidetector CT imaging of the pelvis was performed following the standard protocol without intravenous contrast.  COMPARISON:  Radiograph of same day. FINDINGS: Urinary Tract:  No abnormality visualized. Bowel: Rectal wall thickening is noted with surrounding inflammatory changes concerning for proctitis. Vascular/Lymphatic: Atherosclerosis of visualized portion of abdominal aorta is noted. No adenopathy is noted. Reproductive:  Mild prostatic enlargement is noted. Other:  No definite ascites or hernia is noted. Musculoskeletal: No definite fracture is noted. IMPRESSION: No definite fracture or other significant bony abnormality is noted. Rectal wall thickening is noted with surrounding inflammatory changes concerning for proctitis. Aortic Atherosclerosis (ICD10-I70.0). Electronically Signed   By: Marijo Conception M.D.   On: 11/19/2021 13:32   CT FEMUR LEFT WO CONTRAST  Result Date: 11/19/2021 CLINICAL DATA:  Suspected femoral fracture EXAM: CT OF THE LOWER LEFT EXTREMITY WITHOUT CONTRAST TECHNIQUE: Multidetector CT imaging of the lower left extremity was performed according to the standard protocol. COMPARISON:  Pelvic x-[name] earlier the same day. FINDINGS: Bones/Joint/Cartilage No acute fracture identified, specifically in the subcapital region of the femur which was suspected on plain film. Mild degenerative changes of the hip and moderate degenerative changes of the knee noted. Chondrocalcinosis noted in the knee medial and lateral compartments. Ligaments Suboptimally assessed by CT. Muscles and Tendons Calcific densities in the proximal hamstring tendons suggestive of chronic tendinosis. No obvious significant muscle abnormality identified. Soft tissues Mild subcutaneous fat stranding edema in the thigh. Arterial vascular calcifications noted. IMPRESSION: No acute fracture or dislocation identified. Other chronic findings as described. Electronically Signed   By: Ofilia Neas M.D.   On: 11/19/2021 13:39   DG Chest Port 1 View  Result Date: 11/18/2021 CLINICAL DATA:  Possible sepsis. Altered mental status and  shortness of breath. EXAM: PORTABLE CHEST 1 VIEW COMPARISON:  10/25/2021 FINDINGS: Lungs are somewhat hypoinflated with continued hazy opacification over the lung bases left worse than right. Left effusion and atelectasis is likely. Infection in the lung bases is possible. Cardiomediastinal silhouette and remainder of the exam is unchanged. IMPRESSION: Stable bibasilar opacification left worse than right likely effusions with atelectasis. Infection in the lung bases is possible. Electronically Signed   By: Marin Olp M.D.   On: 11/18/2021 19:38   DG Abd Portable 1V  Result Date: 11/19/2021 CLINICAL DATA:  NG-tube. EXAM: PORTABLE ABDOMEN - 1 VIEW COMPARISON:  Abdominal x-[name] 10/25/2021. FINDINGS: Enteric tube tip is at the level of the gastric antrum. Bowel-gas pattern is nonobstructive. There are small densities in the right upper quadrant, possibly related to bowel content. No acute fractures are seen. IMPRESSION: Enteric tube tip is at the level of the gastric antrum. Electronically Signed   By: Ronney Asters M.D.   On: 11/19/2021 19:43   DG Abd Portable 1V  Result Date: 10/25/2021 CLINICAL DATA:  Feeding tube placement. EXAM: PORTABLE ABDOMEN - 1 VIEW COMPARISON:  October 23, 2021. FINDINGS: The bowel gas pattern is normal. Residual contrast is seen in the colon. Distal tip of feeding tube is seen in expected position of distal stomach. No radio-opaque calculi or other significant radiographic abnormality are seen. IMPRESSION: Distal tip of feeding tube seen in expected position of distal stomach. Electronically Signed   By: Marijo Conception M.D.   On: 10/25/2021 17:29   DG Swallowing Func-Speech Pathology  Result Date: 11/22/2021 Table formatting from the original result was not included. Objective Swallowing Evaluation: Type of Study: MBS-Modified Barium Swallow Study  Patient Details Name: [name] [name] MRN: [id number] Date of Birth: [birth year] Today's Date: 11/22/2021 Time: SLP Start Time  (ACUTE ONLY): 1345 -SLP Stop Time (ACUTE ONLY): [id number] SLP Time Calculation (min) (ACUTE ONLY): 20 min Past Medical History: Past Medical History: Diagnosis Date  Allergy   Dementia ([place])   sees Dr. Ellouise Newer   Diverticulosis of colon (without mention of hemorrhage) 03-01-2004, 04-04-2011  Colonoscopy  ED (erectile dysfunction)   Glaucoma   sees Dr. Crecencio Mc   Hyperglycemia   Hyperlipidemia   Hypertension   Internal hemorrhoids 03-15-1999  Flex Sig   Irritable bowel  Past Surgical History: Past Surgical History: Procedure Laterality Date  COLONOSCOPY  04-04-11  per Dr. Sharlett Iles, clear, no repeats needed  EYE SURGERY    bilateral cataract extraction per Dr Dolores Lory  INGUINAL HERNIA REPAIR    KNEE ARTHROSCOPY    right knee  NASAL SINUS SURGERY   HPI: TRAEVON LATHEM is a 86 y.o. male with known history of dementia, seizures, BPH, decubitus ulcers, pericardial effusion anemia was recently admitted for pneumonia discharged about 2 weeks ago was brought to the ER after patient was found to be increasingly confused and lethargic. CT chest was showing bilateral infiltrates concerning for pneumonia also was showing mild pericardial effusion. Most recent MBS 10/28/21 recommending Dys 2/honey thick and pt was able to upgrade to Dys 2/nectar prior to discharge.  Subjective: lethargic but able to particiate in minimal amount of PO intake and follow basic directions  Recommendations for follow up therapy are one component of a multi-disciplinary discharge planning process, led by the attending physician.  Recommendations may be updated based on patient status, additional functional criteria and insurance authorization. Assessment / Plan / Recommendation Clinical Impressions 11/22/2021 Clinical Impression Patient presents with a mild oral and a severe pharyngeal phase dysphagia which is significantly declined as compared to MBS documented from 3 weeks ago. NG feeding tube in place during today's study and was in place during prior  MBS as well. During today's MBS, patient was lethargic and required cues to maintain alertness. He was able to follow basic directions to swallow again, cough. First PO administered was puree texture and majority of this bolus become retained in vallecular and pyriform sinus. He exhibited one instance of sensed aspiration (PAS 7) of moderate amount of puree  which occured after the swallow and was from pyriform sinus residuals. Several other instances of aspiration occured, all of which were during or after swallows and all were silent. (PAS 8). SLP then gave patient spoon sip of nectar thick liquids, followed by spoon sip of thin liquid barium, both of which did not aid in reducing vallecular and sinus residuals. Very small amount of barium contrast would transit through UES with each swallow. Patient was able to cough and transit penetrate, aspirate out of laryngeal vestibule, but not consistently. There was a mild reduction in amount of vallecular and pyriform sinus residuals with multiple cued cough/throat clear and reswallows, dry swallows, however unable to achieve full clearance of pharyngeal residuals by end of study. SLP is recommending continue with NPO status and prognosis of PO advancement is guarded at this time. SLP Visit Diagnosis Dysphagia, oropharyngeal phase (R13.12) Attention and concentration deficit following -- Frontal lobe and executive function deficit following -- Impact on safety and function Severe aspiration risk;Risk for inadequate nutrition/hydration   Treatment Recommendations 11/22/2021 Treatment Recommendations Therapy as outlined in treatment plan below   Prognosis 11/22/2021 Prognosis for Safe Diet Advancement Guarded Barriers to Reach Goals Severity of deficits;Cognitive deficits Barriers/Prognosis Comment -- Diet Recommendations 11/22/2021 SLP Diet Recommendations NPO Liquid Administration via -- Medication Administration Via alternative means Compensations -- Postural Changes --    Other Recommendations 11/22/2021 Recommended Consults -- Oral Care Recommendations Oral care QID;Staff/trained caregiver to provide oral care Other Recommendations -- Follow Up Recommendations Skilled nursing-short term rehab (<3 hours/day) Assistance recommended at discharge Frequent or constant Supervision/Assistance Functional Status Assessment Patient has had a recent decline in their functional status and/or demonstrates limited ability to make significant improvements in function in a reasonable and predictable amount of time Frequency and Duration  11/22/2021 Speech Therapy Frequency (ACUTE ONLY) min 1 x/week Treatment Duration 1 week   Oral Phase 11/22/2021 Oral Phase Impaired Oral - Pudding Teaspoon -- Oral - Pudding Cup -- Oral - Honey Teaspoon -- Oral - Honey Cup NT Oral - Nectar Teaspoon Delayed oral transit;Reduced posterior propulsion Oral - Nectar Cup NT Oral - Nectar Straw NT Oral - Thin Teaspoon Delayed oral transit;Reduced posterior propulsion Oral - Thin Cup NT Oral - Thin Straw NT Oral - Puree Delayed oral transit Oral - Mech Soft -- Oral - Regular NT Oral - Multi-Consistency -- Oral - Pill NT Oral Phase - Comment --  Pharyngeal Phase 11/22/2021 Pharyngeal Phase Impaired Pharyngeal- Pudding Teaspoon -- Pharyngeal -- Pharyngeal- Pudding Cup -- Pharyngeal -- Pharyngeal- Honey Teaspoon NT Pharyngeal -- Pharyngeal- Honey Cup NT Pharyngeal -- Pharyngeal- Nectar Teaspoon Reduced anterior laryngeal mobility;Reduced airway/laryngeal closure;Penetration/Apiration after swallow;Moderate aspiration;Pharyngeal residue - valleculae;Pharyngeal residue - pyriform Pharyngeal -- Pharyngeal- Nectar Cup NT Pharyngeal -- Pharyngeal- Nectar Straw -- Pharyngeal -- Pharyngeal- Thin Teaspoon Delayed swallow initiation-vallecula;Reduced anterior laryngeal mobility;Reduced airway/laryngeal closure;Moderate aspiration;Penetration/Apiration after swallow;Penetration/Aspiration during swallow;Pharyngeal residue -  valleculae;Pharyngeal residue - pyriform Pharyngeal -- Pharyngeal- Thin Cup NT Pharyngeal -- Pharyngeal- Thin Straw NT Pharyngeal -- Pharyngeal- Puree Delayed swallow initiation-vallecula;Penetration/Apiration after swallow;Moderate aspiration;Pharyngeal residue - pyriform;Pharyngeal residue - valleculae;Reduced anterior laryngeal mobility;Reduced airway/laryngeal closure Pharyngeal -- Pharyngeal- Mechanical Soft -- Pharyngeal -- Pharyngeal- Regular NT Pharyngeal -- Pharyngeal- Multi-consistency -- Pharyngeal -- Pharyngeal- Pill NT Pharyngeal -- Pharyngeal Comment --  Cervical Esophageal Phase  11/22/2021 Cervical Esophageal Phase Impaired Pudding Teaspoon -- Pudding Cup -- Honey Teaspoon -- Honey Cup -- Nectar Teaspoon Reduced cricopharyngeal relaxation Nectar Cup -- Nectar Straw -- Thin Teaspoon Reduced cricopharyngeal relaxation Thin Cup Reduced cricopharyngeal relaxation  Thin Straw -- Puree Reduced cricopharyngeal relaxation Mechanical Soft -- Regular -- Multi-consistency -- Pill -- Cervical Esophageal Comment -- Sonia Baller, MA, CCC-SLP Speech Therapy                     DG Swallowing Func-Speech Pathology  Result Date: 10/28/2021 Table formatting from the original result was not included. Objective Swallowing Evaluation: Type of Study: MBS-Modified Barium Swallow Study  Patient Details Name: [name] [name] MRN: [id number] Date of Birth: 1935-11-22 Today's Date: 10/28/2021 Time: SLP Start Time (ACUTE ONLY): 1400 -SLP Stop Time (ACUTE ONLY): [id number] SLP Time Calculation (min) (ACUTE ONLY): 20 min Past Medical History: Past Medical History: Diagnosis Date  Allergy   Dementia ([place])   sees Dr. Ellouise Newer   Diverticulosis of colon (without mention of hemorrhage) 03-01-2004, 04-04-2011  Colonoscopy  ED (erectile dysfunction)   Glaucoma   sees Dr. Crecencio Mc   Hyperglycemia   Hyperlipidemia   Hypertension   Internal hemorrhoids 03-15-1999  Flex Sig   Irritable bowel  Past Surgical History: Past Surgical History:  Procedure Laterality Date  COLONOSCOPY  04-04-11  per Dr. Sharlett Iles, clear, no repeats needed  EYE SURGERY    bilateral cataract extraction per Dr Dolores Lory  INGUINAL HERNIA REPAIR    KNEE ARTHROSCOPY    right knee  NASAL SINUS SURGERY   HPI: Pt is an 86 y/o male admitted 10/26 from nursing home with respiratory distress/concern for aspiration. CXR 10/26: Bilateral lower lung airspace disease, left greater than right, concerning for infection. PMT 11/3: Family is open to all offered and available medical interventions to prolong life. PMH: dementia, seizures, BPH, GI bleed, HTN, HLD, hyperglycemia.  No data recorded Assessment / Plan / Recommendation CHL IP CLINICAL IMPRESSIONS 10/28/2021 Clinical Impression Pt presents with oropharyngeal dysphagia characterized by decreased bolus cohesion, a pharyngeal delay, reduced lingual retraction, and reduced anterior laryngeal movement. He demonstrate mild vallecular residue and moderate pyriform sinus residue. Residue increased with bolus size and with advancement of solid bolus consistency. Residue was eliminated with consecutive swallows of thin liquids. However, epiglottic inversion was inconsistent with liquids. Incomplete epiglottic inversion, combined with the presence of pyriform sinus residue, and reduced distension of the pharyngoesophageal segment resulted in penetration (PAS 5) and sensed aspiration (PAS 7) of thin liquids and nectar thick liquids. Coughing was effective in mobilizing penetrate and in expelling less severe (PAS 3) instances of trace penetration. Laryngeal invasion was eliminated with reduced bolus sizes of nectar thick liquids via cup and with cued secondary swallows; however, pt demonstrated difficulty with consistency of the latter. Pt exhibited impulsive tendencies which places him at increased risk for aspiration of liquids given his pharyngeal delay. A dysphagia 2 diet with honey thick liquids will be initiated at this time. SLP will follow for  dysphagia treatment, and advancement as clinically indicated. SLP Visit Diagnosis Dysphagia, oropharyngeal phase (R13.12) Attention and concentration deficit following -- Frontal lobe and executive function deficit following -- Impact on safety and function Moderate aspiration risk   CHL IP TREATMENT RECOMMENDATION 10/28/2021 Treatment Recommendations Therapy as outlined in treatment plan below   Prognosis 10/28/2021 Prognosis for Safe Diet Advancement Fair Barriers to Reach Goals Severity of deficits;Cognitive deficits Barriers/Prognosis Comment -- CHL IP DIET RECOMMENDATION 10/28/2021 SLP Diet Recommendations Dysphagia 2 (Fine chop) solids;Honey thick liquids Liquid Administration via Cup;No straw Medication Administration Via alternative means Compensations Minimize environmental distractions;Slow rate;Small sips/bites;Follow solids with liquid Postural Changes Seated upright at 90 degrees   CHL IP OTHER RECOMMENDATIONS  10/28/2021 Recommended Consults -- Oral Care Recommendations Staff/trained caregiver to provide oral care;Oral care BID Other Recommendations --   CHL IP FOLLOW UP RECOMMENDATIONS 10/28/2021 Follow up Recommendations Skilled Nursing facility   [facility] IP FREQUENCY AND DURATION 10/28/2021 Speech Therapy Frequency (ACUTE ONLY) min 2x/week Treatment Duration 2 weeks      CHL IP ORAL PHASE 10/28/2021 Oral Phase Impaired Oral - Pudding Teaspoon -- Oral - Pudding Cup -- Oral - Honey Teaspoon -- Oral - Honey Cup Premature spillage;Decreased bolus cohesion Oral - Nectar Teaspoon -- Oral - Nectar Cup Premature spillage;Decreased bolus cohesion Oral - Nectar Straw Premature spillage;Decreased bolus cohesion Oral - Thin Teaspoon NT Oral - Thin Cup Premature spillage;Decreased bolus cohesion Oral - Thin Straw Premature spillage;Decreased bolus cohesion Oral - Puree WFL Oral - Mech Soft -- Oral - Regular WFL Oral - Multi-Consistency -- Oral - Pill (No Data) Oral Phase - Comment --  CHL IP PHARYNGEAL PHASE 10/28/2021  Pharyngeal Phase Impaired Pharyngeal- Pudding Teaspoon -- Pharyngeal -- Pharyngeal- Pudding Cup -- Pharyngeal -- Pharyngeal- Honey Teaspoon NT Pharyngeal -- Pharyngeal- Honey Cup Pharyngeal residue - pyriform;Pharyngeal residue - valleculae;Reduced laryngeal elevation;Reduced anterior laryngeal mobility;Reduced tongue base retraction Pharyngeal -- Pharyngeal- Nectar Teaspoon NT Pharyngeal -- Pharyngeal- Nectar Cup Pharyngeal residue - pyriform;Pharyngeal residue - valleculae;Reduced laryngeal elevation;Reduced anterior laryngeal mobility;Reduced tongue base retraction;Penetration/Aspiration during swallow;Penetration/Apiration after swallow Pharyngeal Material enters airway, passes BELOW cords and not ejected out despite cough attempt by patient;Material enters airway, CONTACTS cords and not ejected out Pharyngeal- Nectar Straw Pharyngeal residue - pyriform;Pharyngeal residue - valleculae;Reduced laryngeal elevation;Reduced anterior laryngeal mobility;Reduced tongue base retraction;Penetration/Aspiration during swallow;Penetration/Apiration after swallow Pharyngeal Material enters airway, passes BELOW cords and not ejected out despite cough attempt by patient Pharyngeal- Thin Teaspoon NT Pharyngeal -- Pharyngeal- Thin Cup Pharyngeal residue - pyriform;Pharyngeal residue - valleculae;Reduced laryngeal elevation;Reduced anterior laryngeal mobility;Reduced tongue base retraction;Moderate aspiration;Penetration/Apiration after swallow;Penetration/Aspiration during swallow Pharyngeal -- Pharyngeal- Thin Straw Pharyngeal residue - pyriform;Pharyngeal residue - valleculae;Reduced laryngeal elevation;Reduced anterior laryngeal mobility;Reduced tongue base retraction;Penetration/Aspiration during swallow;Penetration/Apiration after swallow Pharyngeal Material enters airway, passes BELOW cords and not ejected out despite cough attempt by patient;Material enters airway, CONTACTS cords and not ejected out Pharyngeal- Puree  Pharyngeal residue - pyriform;Pharyngeal residue - valleculae;Reduced laryngeal elevation;Reduced anterior laryngeal mobility;Reduced tongue base retraction Pharyngeal -- Pharyngeal- Mechanical Soft -- Pharyngeal -- Pharyngeal- Regular Pharyngeal residue - pyriform;Pharyngeal residue - valleculae;Reduced laryngeal elevation;Reduced anterior laryngeal mobility;Reduced tongue base retraction Pharyngeal -- Pharyngeal- Multi-consistency -- Pharyngeal -- Pharyngeal- Pill -- Pharyngeal -- Pharyngeal Comment --  CHL IP CERVICAL ESOPHAGEAL PHASE 10/28/2021 Cervical Esophageal Phase -- Pudding Teaspoon -- Pudding Cup -- Honey Teaspoon -- Honey Cup -- Nectar Teaspoon -- Nectar Cup -- Nectar Straw -- Thin Teaspoon -- Thin Cup Reduced cricopharyngeal relaxation Thin Straw -- Puree -- Mechanical Soft -- Regular -- Multi-consistency -- Pill -- Cervical Esophageal Comment -- Shanika I. Hardin Negus, [place], [place] Office number [phone] Pager [place] 10/28/2021, 4:12 PM              EEG adult  Result Date: 11/19/2021 Lora Havens, MD     11/19/2021 12:57 PM Patient Name: [name] [name] MRN: [id number] Epilepsy Attending: Lora Havens Referring Physician/Provider: Dr Florencia Reasons Date: 11/19/2021 Duration: 22.41 mins Patient history: 86 year old male with altered mental status.  EEG to evaluate for seizure. Level of alertness: Awake, asleep AEDs during EEG study: LEV Technical aspects: This EEG study was done with scalp electrodes positioned according to the 10-20 International system of  electrode placement. Electrical activity was acquired at a sampling rate of 500Hz  and reviewed with a high frequency filter of 70Hz  and a low frequency filter of 1Hz . EEG data were recorded continuously and digitally stored. Description: During awake state, no clear posterior dominant rhythm was seen.  Sleep was characterized by sleep spindles (12 to 14 Hz), maximal frontocentral region.   EEG showed continuous generalized predominantly 5 to 6 Hz theta as well as intermittent generalized 2 to 3 Hz delta slowing. Hyperventilation and photic stimulation were not performed.   ABNORMALITY - Continuous slow, generalized IMPRESSION: This study is suggestive of moderate diffuse encephalopathy, nonspecific etiology. No seizures or epileptiform discharges were seen throughout the recording. Lora Havens   ECHOCARDIOGRAM COMPLETE  Result Date: 11/19/2021    ECHOCARDIOGRAM REPORT   Patient Name:   [name] [name] Date of Exam: 11/19/2021 Medical Rec #:  [id number]        Height:       73.0 in Accession #:    [id number]       Weight:       166.0 lb Date of Birth:  12/16/1935        BSA:          1.988 m Patient Age:    73 years         BP:           130/69 mmHg Patient Gender: M                HR:           67 bpm. Exam Location:  Inpatient Procedure: 2D Echo, Cardiac Doppler and Color Doppler Indications:    Pericardial effusion I31.3  History:        Patient has prior history of Echocardiogram examinations, most                 recent 02/09/2021. Arrythmias:RBBB, Signs/Symptoms:Dementia; Risk                 Factors:Hypertension and Dyslipidemia. Recurrent pneumonia.                 History of pericardial effusion.  Sonographer:    Darlina Sicilian RDCS Referring Phys: [place]  1. Left ventricular ejection fraction, by estimation, is 60 to 65%. The left ventricle has normal function. The left ventricle has no regional wall motion abnormalities. Left ventricular diastolic parameters are consistent with Grade II diastolic dysfunction (pseudonormalization). Elevated left ventricular end-diastolic pressure.  2. Right ventricular systolic function is normal. The right ventricular size is normal. There is normal pulmonary artery systolic pressure.  3. The pericardial effusion is posterior to the left ventricle and lateral to the left ventricle.  4. The mitral valve is abnormal.  Trivial mitral valve regurgitation. No evidence of mitral stenosis.  5. The aortic valve is normal in structure. There is mild calcification of the aortic valve. Aortic valve regurgitation is mild to moderate. Aortic valve sclerosis/calcification is present, without any evidence of aortic stenosis.  6. Aortic dilatation noted. There is mild dilatation of the aortic root, measuring 39 mm. There is mild dilatation of the ascending aorta, measuring 41 mm.  7. The inferior vena cava is normal in size with greater than 50% respiratory variability, suggesting right atrial pressure of 3 mmHg. FINDINGS  Left Ventricle: Left ventricular ejection fraction, by estimation, is 60 to 65%. The left ventricle has normal function. The left ventricle has no regional wall motion abnormalities. The  left ventricular internal cavity size was normal in size. There is  no left ventricular hypertrophy. Left ventricular diastolic parameters are consistent with Grade II diastolic dysfunction (pseudonormalization). Elevated left ventricular end-diastolic pressure. Right Ventricle: The right ventricular size is normal. No increase in right ventricular wall thickness. Right ventricular systolic function is normal. There is normal pulmonary artery systolic pressure. The tricuspid regurgitant velocity is 2.28 m/s, and  with an assumed right atrial pressure of 3 mmHg, the estimated right ventricular systolic pressure is [id number]. Left Atrium: Left atrial size was normal in size. Right Atrium: Right atrial size was normal in size. Pericardium: Trivial pericardial effusion is present. The pericardial effusion is posterior to the left ventricle and lateral to the left ventricle. Mitral Valve: The mitral valve is abnormal. There is mild thickening of the mitral valve leaflet(s). Trivial mitral valve regurgitation. No evidence of mitral valve stenosis. Tricuspid Valve: The tricuspid valve is normal in structure. Tricuspid valve regurgitation is mild .  No evidence of tricuspid stenosis. Aortic Valve: The aortic valve is normal in structure. There is mild calcification of the aortic valve. Aortic valve regurgitation is mild to moderate. Aortic regurgitation PHT measures 690 msec. Aortic valve sclerosis/calcification is present, without any evidence of aortic stenosis. Pulmonic Valve: The pulmonic valve was normal in structure. Pulmonic valve regurgitation is mild. No evidence of pulmonic stenosis. Aorta: The aortic root is normal in size and structure and aortic dilatation noted. There is mild dilatation of the aortic root, measuring 39 mm. There is mild dilatation of the ascending aorta, measuring 41 mm. Venous: The inferior vena cava is normal in size with greater than 50% respiratory variability, suggesting right atrial pressure of 3 mmHg. IAS/Shunts: No atrial level shunt detected by color flow Doppler.  LEFT VENTRICLE PLAX 2D LVIDd:         5.40 cm   Diastology LVIDs:         3.10 cm   LV e' medial:    3.73 cm/s LV PW:         0.80 cm   LV E/e' medial:  18.3 LV IVS:        1.10 cm   LV e' lateral:   3.89 cm/s LVOT diam:     2.30 cm   LV E/e' lateral: 17.5 LV SV:         64 LV SV Index:   32 LVOT Area:     4.15 cm                           3D Volume EF:                          3D EF:        63 %                          LV EDV:       130 ml                          LV ESV:       49 ml                          LV SV:        81 ml RIGHT VENTRICLE RV S prime:  12.90 cm/s TAPSE (M-mode): 2.0 cm LEFT ATRIUM             Index        RIGHT ATRIUM           Index LA diam:        2.70 cm 1.36 cm/m   RA Area:     10.30 cm LA Vol (A2C):   33.0 ml 16.60 ml/m  RA Volume:   17.80 ml  8.95 ml/m LA Vol (A4C):   26.7 ml 13.43 ml/m LA Biplane Vol: 30.5 ml 15.34 ml/m  AORTIC VALVE LVOT Vmax:   82.90 cm/s LVOT Vmean:  58.800 cm/s LVOT VTI:    0.155 m AI PHT:      690 msec  AORTA Ao Root diam: 3.90 cm Ao STJ diam:  3.1 cm Ao Asc diam:  4.05 cm MITRAL VALVE                TRICUSPID VALVE MV Area (PHT): 2.91 cm    TR Peak grad:   20.8 mmHg MV Decel Time: 261 msec    TR Vmax:        228.00 cm/s MV E velocity: 68.10 cm/s MV A velocity: 74.10 cm/s  SHUNTS MV E/A ratio:  0.92        Systemic VTI:  0.16 m                            Systemic Diam: 2.30 cm Charlton Haws MD Electronically signed by Charlton Haws MD Signature Date/Time: 11/19/2021/11:10:26 AM    Final    VAS [place] UPPER EXTREMITY VENOUS DUPLEX  Result Date: 11/22/2021 UPPER VENOUS STUDY  Patient Name:  [name] [name]  Date of Exam:   11/21/2021 Medical Rec #: [id number]         Accession #:    [id number] Date of Birth: Aug 28, 1935         Patient Gender: M Patient Age:   63 years Exam Location:  [facility] Procedure:      VAS [place] UPPER EXTREMITY VENOUS DUPLEX Referring Phys: Parke Poisson XU --------------------------------------------------------------------------------  Indications: Swelling Limitations: Patient immobilty. Comparison Study: No previous exams Performing Technologist: Jody Hill RVT, RDMS  Examination Guidelines: A complete evaluation includes B-mode imaging, spectral Doppler, color Doppler, and power Doppler as needed of all accessible portions of each vessel. Bilateral testing is considered an integral part of a complete examination. Limited examinations for reoccurring indications may be performed as noted.  Right Findings: +----------+------------+---------+-----------+----------+-------+ RIGHT     CompressiblePhasicitySpontaneousPropertiesSummary +----------+------------+---------+-----------+----------+-------+ IJV           Full       Yes       Yes                      +----------+------------+---------+-----------+----------+-------+ Subclavian    Full       Yes       Yes                      +----------+------------+---------+-----------+----------+-------+ Axillary      Full       Yes       Yes                       +----------+------------+---------+-----------+----------+-------+ Brachial      Full                                          +----------+------------+---------+-----------+----------+-------+  Radial        Full                                          +----------+------------+---------+-----------+----------+-------+ Ulnar         Full                                          +----------+------------+---------+-----------+----------+-------+ Cephalic      Full                                          +----------+------------+---------+-----------+----------+-------+ Basilic       Full       Yes       Yes                      +----------+------------+---------+-----------+----------+-------+  Left Findings: +----------+------------+---------+-----------+----------+-------+ LEFT      CompressiblePhasicitySpontaneousPropertiesSummary +----------+------------+---------+-----------+----------+-------+ Subclavian    Full       Yes       Yes                      +----------+------------+---------+-----------+----------+-------+  Summary:  Right: No evidence of deep vein thrombosis in the upper extremity. No evidence of superficial vein thrombosis in the upper extremity.  Left: No evidence of thrombosis in the subclavian.  *See table(s) above for measurements and observations.  Diagnosing physician: Orlie Pollen Electronically signed by Orlie Pollen on 11/22/2021 at 10:23:16 AM.    Final     Labs:  CBC: Recent Labs    11/19/21 0018 11/19/21 0128 11/20/21 0332 11/22/21 0401  WBC 8.9 8.6 4.0 4.4  HGB 12.7* 12.9* 10.5* 11.1*  HCT 42.1 41.8 33.9* 35.2*  PLT 227 254 206 178    COAGS: Recent Labs    10/20/21 2029 11/18/21 1904  INR 1.3* 1.0  APTT 43* 33    BMP: Recent Labs    11/19/21 0128 11/20/21 0332 11/21/21 0350 11/22/21 0401  NA 140 140 141 141  K 4.2 3.8 3.7 4.0  CL 104 106 106 105  CO2 29 28 30  32  GLUCOSE 103* 82 85 123*  BUN 17 16  13 14   CALCIUM 8.9 8.4* 8.6* 8.2*  CREATININE 0.76 0.74 0.69 0.61  GFRNONAA >60 >60 >60 >60    LIVER FUNCTION TESTS: Recent Labs    10/26/21 0327 10/28/21 0309 11/18/21 1926 11/19/21 0128  BILITOT 0.5 0.3 0.2* 0.3  AST 27 22 25 22   ALT 20 19 17 15   ALKPHOS 57 54 59 59  PROT 4.6* 4.8* 6.1* 6.0*  ALBUMIN 1.7* 1.7* 2.7* 2.7*    TUMOR MARKERS: No results for input(s): AFPTM, CEA, CA199, CHROMGRNA in the last 8760 hours.  Assessment and Plan: 86 y.o. male with advanced dementia with severe pharyngeal dysphagia with notable aspiration seen during SLP evaluation.  He is in need of long-term nutritional support.   IR was requested for image guided gastrostomy tube placement. Case was reviewed and approved by Dr. Dwaine Gale, patient to receive contrast through NG tube the night before the procedure.  VSS no leukocytosis, blood culture negative INR on 11/18/2021 1.0, PT on subcu Lovenox -will need to be held  for 24 hours  The procedure is tentatively scheduled for Friday, December 2 pending IR schedule and also pending family's approval.   Patient will need orders for stop tube feeding at Friday midnight, 75 mL Omnipaque 300 to be given the day before the procedure, and Ancef 2 g to be given during the procedure.   Thank you for this interesting consult.  I greatly enjoyed meeting [name] [name] and look forward to participating in their care.  A copy of this report was sent to the requesting provider on this date.  Electronically Signed: Tera Mater, PA-C 11/24/2021, 4:21 PM   I spent a total of 40 Minutes    in face to face in clinical consultation, greater than 50% of which was counseling/coordinating care for G-tube placement.  This chart was dictated using voice recognition software.  Despite best efforts to proofread,  errors can occur which can change the documentation meaning.

## 2021-11-24 NOTE — Progress Notes (Signed)
   Palliative Medicine Inpatient Follow Up Note     Chart Reviewed. Patient assessed at the bedside.   Wife and another family at the bedside. Patient is somnolent during my visit. No acute distress noted. Cortrak in place with plans for PEG placement tentatively on tomorrow 11/26/2021. Appetite remains poor.   Updates provided to wife at the bedside. She verbalized understanding of current treatment plans. Family continues to remain focused on full aggressive interventions and treatment despite patient's continued declined and goals of care discussions on multiple occassions   Wife expressed wishes for patient to be considered for another facility vs [facility]. Education provide on limited options however I have relayed information to [place], CM (TOC).   Patient would benefit from ongoing outpatient palliative support at discharge.   Discussed the importance of continued conversation with family and their  medical providers regarding overall plan of care and treatment options, ensuring decisions are within the context of the patients values and GOCs.  Questions addressed and support provided.    Objective Assessment: Vital Signs Vitals:   11/24/21 0433 11/24/21 1221  BP: (!) 146/77 (!) 143/81  Pulse: 68 71  Resp: 18 19  Temp: 98.2 F (36.8 C) 98 F (36.7 C)  SpO2: 100% 98%    Intake/Output Summary (Last 24 hours) at 11/24/2021 1333 Last data filed at 11/24/2021 0500 Gross per 24 hour  Intake 700 ml  Output 800 ml  Net -100 ml   Last Weight  Most recent update: 11/23/2021  4:39 AM    Weight  78 kg (171 lb 15.3 oz)            Gen:  Somnolent, chronically-ill appearing  HEENT: moist mucous membranes, right nare Cortrak in place CV: Regular rate and rhythm, no murmurs rubs or gallops PULM: diminished bilaterally  ABD: soft/nontender/nondistended/normal bowel sounds Neuro: somnolent, dementia  SUMMARY OF RECOMMENDATIONS   Continue with current plan of care per  medical team  Family has requested to proceed with PEG tube placement for long-term nutrition despite understanding, including risk vs benefits.  Wife is requesting consideration for different facility placement at discharge. Education provided on limited options given PEG and overall condition. She verbalized understanding. [place], CM (TOC) aware.  Recommendations for outpatient palliative at minimum to continue with ongoing goals of care discussions.  PMT will continue to support and follow on as needed basis. Please secure chat for urgent needs.    Time Total: 35 min.   Visit consisted of counseling and education dealing with the complex and emotionally intense issues of symptom management and palliative care in the setting of serious and potentially life-threatening illness.Greater than 50%  of this time was spent counseling and coordinating care related to the above assessment and plan.  Willette Alma, AGPCNP-BC  Palliative Medicine Team [phone]  Palliative Medicine Team providers are available by phone from 7am to 7pm daily and can be reached through the team cell phone. Should this patient require assistance outside of these hours, please call the patient's attending physician.

## 2021-11-25 DIAGNOSIS — G9341 Metabolic encephalopathy: Secondary | ICD-10-CM

## 2021-11-25 DIAGNOSIS — Z4659 Encounter for fitting and adjustment of other gastrointestinal appliance and device: Secondary | ICD-10-CM

## 2021-11-25 DIAGNOSIS — F01518 Vascular dementia, unspecified severity, with other behavioral disturbance: Secondary | ICD-10-CM | POA: Diagnosis not present

## 2021-11-25 DIAGNOSIS — G9340 Encephalopathy, unspecified: Secondary | ICD-10-CM | POA: Diagnosis not present

## 2021-11-25 DIAGNOSIS — R52 Pain, unspecified: Secondary | ICD-10-CM

## 2021-11-25 LAB — GLUCOSE, CAPILLARY
Glucose-Capillary: 104 mg/dL — ABNORMAL HIGH (ref 70–99)
Glucose-Capillary: 113 mg/dL — ABNORMAL HIGH (ref 70–99)
Glucose-Capillary: 125 mg/dL — ABNORMAL HIGH (ref 70–99)
Glucose-Capillary: 127 mg/dL — ABNORMAL HIGH (ref 70–99)
Glucose-Capillary: 129 mg/dL — ABNORMAL HIGH (ref 70–99)
Glucose-Capillary: 142 mg/dL — ABNORMAL HIGH (ref 70–99)

## 2021-11-25 LAB — CREATININE, SERUM
Creatinine, Ser: 0.69 mg/dL (ref 0.61–1.24)
GFR, Estimated: >60 mL/min (ref >60)

## 2021-11-25 MED ORDER — IOHEXOL 350 MG/ML SOLN: 75.0000 mL | Freq: Once | INTRAVENOUS | Status: DC | PRN

## 2021-11-25 MED ORDER — CEFAZOLIN SODIUM-DEXTROSE 2-4 GM/100ML-% IV SOLN
2.0000 g | INTRAVENOUS | Status: DC
  Filled 2021-11-25: qty 100

## 2021-11-25 MED ORDER — CEFAZOLIN SODIUM-DEXTROSE 2-4 GM/100ML-% IV SOLN
2.0000 g | INTRAVENOUS | Status: AC
  Administered 2021-11-26: 2 g via INTRAVENOUS
  Filled 2021-11-25: qty 100

## 2021-11-25 NOTE — TOC Progression Note (Signed)
Transition of Care [facility]) - Progression Note    Patient Details  Name: [name] [name] MRN: [id number] Date of Birth: 1935-07-01  Transition of Care [facility]) CM/SW Contact  Golda Acre, RN Phone Number: 11/25/2021, 3:17 PM  Clinical Narrative:    Wife is not happy with [name] would like to see if he can go elsewhere.  cFl2 updated and sent out.   Expected Discharge Plan: Skilled Nursing Facility Barriers to Discharge: Continued Medical Work up  Expected Discharge Plan and Services Expected Discharge Plan: Skilled Nursing Facility                                               Social Determinants of Health (SDOH) Interventions    Readmission Risk Interventions Readmission Risk Prevention Plan 10/22/2021  Transportation Screening Complete  Medication Review (RN Care Manager) Referral to Pharmacy  PCP or Specialist appointment within 3-5 days of discharge Complete  HRI or Home Care Consult Not Complete  HRI or Home Care Consult Pt Refusal Comments from SNF  SW Recovery Care/Counseling Consult Complete  Palliative Care Screening Not Applicable  Skilled Nursing Facility Complete  Some recent data might be hidden

## 2021-11-25 NOTE — Care Management Important Message (Signed)
Important Message  Patient Details IM Letter placed in Patients room. Name: [name] [name] MRN: [id number] Date of Birth: 1935/02/25   Medicare Important Message Given:  Yes     [name] 11/25/2021, 2:50 PM

## 2021-11-25 NOTE — NC FL2 (Signed)
[place] LEVEL OF CARE SCREENING TOOL     IDENTIFICATION  Patient Name: [name] [name] Birthdate: [birth year] Sex: male Admission Date (Current Location): 11/18/2021  [facility] and [place] Number:  Herbalist and Address:  [facility],  [place], [place]      Provider Number: [id number]  Attending Physician Name and Address:  Shelly Coss, MD  Relative Name and Phone Number:  Fullard,[name] Spouse [id number]  [phone]  Yaacov, Kirkman   [phone]  Ammon, Forestier [id number]  [phone]  [place] Daughter   [phone]    Current Level of Care: Hospital Recommended Level of Care: [place] Prior Approval Number:    Date Approved/Denied:   PASRR Number: [id number]  Discharge Plan: SNF    Current Diagnoses: Patient Active Problem List   Diagnosis Date Noted   Acute metabolic encephalopathy [id number]   Acute encephalopathy 11/18/2021   Recurrent pneumonia 10/20/2021   Pressure injury of skin 05/11/2021   Pericardial effusion, acute    COVID    Palliative care by specialist    Goals of care, counseling/discussion    General weakness    Syncope 05/07/2021   Seizure ([place]) 03/09/2021   Dementia ([place]) 02/25/2021   BPH with urinary obstruction 12/22/2020   Ankle edema, bilateral 07/13/2020   Hallucinations 02/09/2020   Abdominal pain, acute 08/21/2014   GI bleeding 05/26/2012   Renal mass 05/26/2012   Diverticulosis of colon with hemorrhage 05/26/2012   SHOULDER PAIN, BILATERAL 07/30/2010   HERPES SIMPLEX INFECTION 10/10/2007   TACHYCARDIA, PAROXYSMAL NOS 10/10/2007   Dyslipidemia 07/11/2007   Essential hypertension 07/11/2007   ALLERGIC RHINITIS 07/11/2007    Orientation RESPIRATION BLADDER Height & Weight     Self  Normal Incontinent Weight: 80 kg Height:  6\' 1"  (185.4 cm)  BEHAVIORAL SYMPTOMS/MOOD NEUROLOGICAL BOWEL NUTRITION STATUS      Continent  Diet  AMBULATORY STATUS COMMUNICATION OF NEEDS Skin   Extensive Assist Verbally PU Stage and Appropriate Care PU Stage 1 Dressing: Daily                     Personal Care Assistance Level of Assistance  Bathing, Feeding, Dressing Bathing Assistance: Maximum assistance Feeding assistance: Maximum assistance Dressing Assistance: Maximum assistance     Functional Limitations Info  Sight, Hearing, Speech Sight Info: Adequate Hearing Info: Adequate Speech Info: Adequate    SPECIAL CARE FACTORS FREQUENCY  PT (By licensed PT), OT (By licensed OT)     PT Frequency: 5 x week OT Frequency: 5 x week            Contractures Contractures Info: Not present    Additional Factors Info  Code Status, Psychotropic, Allergies, Isolation Precautions Code Status Info: full code Allergies Info: chocolate cipro lipitor Psychotropic Info: risperdal   Isolation Precautions Info: MRSA for contact precautions.     Current Medications (11/25/2021):  This is the current hospital active medication list Current Facility-Administered Medications  Medication Dose Route Frequency Provider Last Rate Last Admin   acetaminophen (TYLENOL) 160 MG/5ML solution 650 mg  650 mg Per Tube Q6H PRN Florencia Reasons, MD   650 mg at 11/23/21 [id number]   Or   acetaminophen (TYLENOL) suppository 650 mg  650 mg Rectal Q6H PRN Florencia Reasons, MD       amoxicillin-clavulanate (AUGMENTIN) 250-62.5 MG/5ML suspension 500 mg  500 mg Per Tube Q8H Adhikari, Amrit, MD   500 mg at 11/25/21 1400   ascorbic acid (VITAMIN C)  tablet 250 mg  250 mg Per Tube Daily Albertine Grates, MD   250 mg at 11/25/21 0842   brinzolamide (AZOPT) 1 % ophthalmic suspension 1 drop  1 drop Both Eyes TID Eduard Clos, MD   1 drop at 11/25/21 0843   ceFAZolin (ANCEF) IVPB 2g/100 mL premix  2 g Intravenous to Knox Saliva, NP       chlorhexidine (PERIDEX) 0.12 % solution 15 mL  15 mL Mouth Rinse BID Albertine Grates, MD   15 mL at 11/25/21 0845   donepezil (ARICEPT)  tablet 10 mg  10 mg Per Tube Meribeth Mattes, MD   10 mg at 11/24/21 2217   enoxaparin (LOVENOX) injection 40 mg  40 mg Subcutaneous Q24H Eduard Clos, MD   40 mg at 11/25/21 0844   feeding supplement (JEVITY 1.2 CAL) liquid 1,000 mL  1,000 mL Per Tube Continuous Albertine Grates, MD 70 mL/hr at 11/24/21 2227 1,000 mL at 11/24/21 2227   feeding supplement (PROSource TF) liquid 45 mL  45 mL Per Tube Daily Albertine Grates, MD   45 mL at 11/25/21 0842   free water 150 mL  150 mL Per Tube Q6H Albertine Grates, MD   150 mL at 11/25/21 1200   iohexol (OMNIPAQUE) 350 MG/ML injection 75 mL  75 mL Per Tube Once PRN Alex Gardener T, NP       latanoprost (XALATAN) 0.005 % ophthalmic solution 1 drop  1 drop Both Eyes QHS Eduard Clos, MD   1 drop at 11/24/21 2219   levETIRAcetam (KEPPRA) 100 MG/ML solution 500 mg  500 mg Per Tube BID Albertine Grates, MD   500 mg at 11/25/21 0844   lip balm (CARMEX) ointment   Topical PRN Albertine Grates, MD   75 application at 11/19/21 2310   MEDLINE mouth rinse  15 mL Mouth Rinse BID Eduard Clos, MD   15 mL at 11/25/21 1100   memantine (NAMENDA) tablet 10 mg  10 mg Per Tube Meribeth Mattes, MD   10 mg at 11/24/21 2217   multivitamin liquid 15 mL  15 mL Per Tube Daily Albertine Grates, MD   15 mL at 11/25/21 0846   Netarsudil Dimesylate 0.02 % SOLN 1 drop  1 drop Both Eyes QHS Eduard Clos, MD       risperiDONE (RISPERDAL) tablet 0.5 mg  0.5 mg Per Tube QHS Albertine Grates, MD   0.5 mg at 11/24/21 2217   valproic acid (DEPAKENE) 250 MG/5ML solution 1,000 mg  1,000 mg Per Tube Daily Albertine Grates, MD   1,000 mg at 11/25/21 0845   valproic acid (DEPAKENE) 250 MG/5ML solution 500 mg  500 mg Per Tube QHS Albertine Grates, MD   500 mg at 11/24/21 2217   zinc oxide 20 % ointment   Topical BID Albertine Grates, MD   Given at 11/25/21 [phone]     Discharge Medications: Please see discharge summary for a list of discharge medications.  Relevant Imaging Results:  Relevant Lab Results:   Additional Information SSN  [id number]  Golda Acre, RN

## 2021-11-25 NOTE — Progress Notes (Signed)
PROGRESS NOTE    [name] [name]  [id number] DOB: [birth year] DOA: 11/18/2021 PCP: Raymondo Band, MD   Chief Complain:Lethargy  Brief Narrative: Patient is a 86 year old male with history of seizure, Advanced dementia ,nursing home resident who was discharged from here  2 weeks ago for COVID-pneumonia, found to have lethargy on 11:24 AM, per nursing staff patient is normally talkative, aaox1, will even sing to the snf RN, but he was noticed to have increased crackles and rhonchi required suctioning .  He was suspected to have recurrent aspiration pneumonia, so sent here.currently on antibiotics for aspiration pneumonia.  Mental status slowly improving but he is severely dysphasic requiring feeding tube.Speech therapy following .  After discussion with family, we decided to go ahead and put PEG tube.  IR planning for PEG placement on 11/26/2021  Assessment & Plan:   Principal Problem:   Acute encephalopathy Active Problems:   Essential hypertension   Dementia (HCC)   Seizure (HCC)   Pressure injury of skin   Recurrent pneumonia   Acute metabolic encephalopathy   Acute metabolic encephalopathy, Possibly from recurrent aspiration pneumonia -CT head no acute findings, ammonia level unremarkable, valproic acid level below low therapeutic -Currently on Augmentin. --Blood culture no growth, urine culture no growth, COVID screening negative -Sputum culture ordered on 11/24, pending collection -Feeding tube placed on 11/25 -Mental status has improved, currently he is awake and alert   Left Pleural effusion Monitor, small to moderate as per CT.  Given a dose of  IV Lasix x1, echo showed diastolic dysfunction There are no lower extremity edema Since respiratory status is stable, we will not do thoracentesis at this time   right upper extremity edema venous Doppler negative for  DVT Elevate right arm   History of seizure On Depakote and Keppra EEG :no seizure    Left hip  pain Pelvic x-ray concerning for nondisplaced left femur fracture, recommend CT for further evaluation , CT did not reveal fracture   BPH Oral meds held, cant be given through tube    FTT/Dysphagia/high risk for aspiration Frequent falls in the past when he was at home, he was discharge to snf on 11/11,  per SNF RN" patient was only able to sit on edge of bed, but could not get out of bed.  Has not walked since discharged on 11/11" Family interested  on PEG placement if needed.  Wife says is okay to go ahead and plan for PEG .  He has high risks of aspiration in the future.  Plan for PEG placement on 11/26/2021.  Plan is to discharge him back to skilled nursing facility after that.   Advanced dementia/from nursing home: Continue supportive care. Delirium  precautions  Pressure Injury 05/08/21 Coccyx Mid Stage 1 -  Intact skin with non-blanchable redness of a localized area usually over a bony prominence. (Active)  05/08/21 0515  Location: Coccyx  Location Orientation: Mid  Staging: Stage 1 -  Intact skin with non-blanchable redness of a localized area usually over a bony prominence.  Wound Description (Comments):   Present on Admission: Yes     Pressure Injury 10/25/21 Heel Left Deep Tissue Pressure Injury - Purple or maroon localized area of discolored intact skin or blood-filled blister due to damage of underlying soft tissue from pressure and/or shear. purple wound on heel (Active)  10/25/21 0800  Location: Heel  Location Orientation: Left  Staging: Deep Tissue Pressure Injury - Purple or maroon localized area of discolored intact skin or  blood-filled blister due to damage of underlying soft tissue from pressure and/or shear.  Wound Description (Comments): purple wound on heel  Present on Admission:      Pressure Injury 10/25/21 Heel Right Deep Tissue Pressure Injury - Purple or maroon localized area of discolored intact skin or blood-filled blister due to damage of underlying soft  tissue from pressure and/or shear. purple wound on heel (Active)  10/25/21 0800  Location: Heel  Location Orientation: Right  Staging: Deep Tissue Pressure Injury - Purple or maroon localized area of discolored intact skin or blood-filled blister due to damage of underlying soft tissue from pressure and/or shear.  Wound Description (Comments): purple wound on heel  Present on Admission:      Pressure Injury 11/18/21 Sacrum (Active)  11/18/21 2357  Location: Sacrum  Location Orientation:   Staging:   Wound Description (Comments):   Present on Admission: Yes        Nutrition Problem: Inadequate oral intake Etiology: inability to eat      DVT prophylaxis:Lovenox Code Status: Full Family Communication: Wife at bedside on  11/24/2021 Patient status:  Dispo: The patient is from: SNF              Anticipated d/c is to: SNF/long term care              Anticipated d/c date is: waiting for PEG placement  Consultants: Palliative care  Procedures: Feeding tube placement  Antimicrobials:  Anti-infectives (From admission, onward)    Start     Dose/Rate Route Frequency Ordered Stop   11/26/21 0700  ceFAZolin (ANCEF) IVPB 2g/100 mL premix  Status:  Discontinued        2 g 200 mL/hr over 30 Minutes Intravenous To Radiology 11/24/21 1639 11/24/21 1640   11/22/21 1400  amoxicillin-clavulanate (AUGMENTIN) 250-62.5 MG/5ML suspension 500 mg        500 mg Per Tube Every 8 hours 11/22/21 1037 11/26/21 0559   11/19/21 1800  Ampicillin-Sulbactam (UNASYN) 3 g in sodium chloride 0.9 % 100 mL IVPB  Status:  Discontinued        3 g 200 mL/hr over 30 Minutes Intravenous Every 6 hours 11/19/21 1644 11/22/21 1036   11/19/21 0100  cefTRIAXone (ROCEPHIN) 2 g in sodium chloride 0.9 % 100 mL IVPB  Status:  Discontinued        2 g 200 mL/hr over 30 Minutes Intravenous Every 24 hours 11/18/21 2358 11/19/21 1621   11/19/21 0100  azithromycin (ZITHROMAX) 500 mg in sodium chloride 0.9 % 250 mL IVPB   Status:  Discontinued        500 mg 250 mL/hr over 60 Minutes Intravenous Every 24 hours 11/18/21 2358 11/19/21 1621   11/18/21 2145  vancomycin (VANCOREADY) IVPB 1500 mg/300 mL  Status:  Discontinued        1,500 mg 150 mL/hr over 120 Minutes Intravenous  Once 11/18/21 2137 11/19/21 0023   11/18/21 2145  ceFEPIme (MAXIPIME) 2 g in sodium chloride 0.9 % 100 mL IVPB  Status:  Discontinued        2 g 200 mL/hr over 30 Minutes Intravenous  Once 11/18/21 2138 11/19/21 0007   11/18/21 2130  ceFEPIme (MAXIPIME) 1 g in sodium chloride 0.9 % 100 mL IVPB  Status:  Discontinued        1 g 200 mL/hr over 30 Minutes Intravenous  Once 11/18/21 2125 11/18/21 2138       Subjective:  Patient seen and examined at the bedside this morning.  Hemodynamically stable.  He was being cleaned when I entered the room.  Not in distress  Objective: Vitals:   11/24/21 1221 11/24/21 2008 11/25/21 0500 11/25/21 0522  BP: (!) 143/81 117/65  136/70  Pulse: 71 79  81  Resp: 19 16  16   Temp: 98 F (36.7 C) 98.2 F (36.8 C)  98.1 F (36.7 C)  TempSrc: Oral Oral  Oral  SpO2: 98% 98%  94%  Weight:   80 kg   Height:        Intake/Output Summary (Last 24 hours) at 11/25/2021 0759 Last data filed at 11/25/2021 0007 Gross per 24 hour  Intake 350 ml  Output 1600 ml  Net -1250 ml   Filed Weights   11/22/21 0600 11/23/21 0439 11/25/21 0500  Weight: 77.7 kg 78 kg 80 kg    Examination:  General exam: Very deconditioned, debilitated, chronically ill looking HEENT: NG feeding tube Respiratory system:  no wheezes or crackles  Cardiovascular system: S1 & S2 heard, RRR.  Gastrointestinal system: Abdomen is nondistended, soft and nontender. Central nervous system: Alert and oriented Extremities: edema of the RUE, no clubbing ,no cyanosis Skin: No rashes, no ulcers,no icterus     Data Reviewed: I have personally reviewed following labs and imaging studies  CBC: Recent Labs  Lab 11/18/21 1904 11/19/21 0018  11/19/21 0128 11/20/21 0332 11/22/21 0401  WBC 9.5 8.9 8.6 4.0 4.4  NEUTROABS 7.4  --   --   --  2.4  HGB 12.4* 12.7* 12.9* 10.5* 11.1*  HCT 40.5 42.1 41.8 33.9* 35.2*  MCV 87.7 88.3 88.7 89.0 85.6  PLT 299 227 254 206 0000000   Basic Metabolic Panel: Recent Labs  Lab 11/18/21 1926 11/19/21 0018 11/19/21 0128 11/19/21 1901 11/20/21 0332 11/20/21 1706 11/21/21 0350 11/22/21 0401 11/25/21 0335  NA 141  --  140  --  140  --  141 141  --   K 4.3  --  4.2  --  3.8  --  3.7 4.0  --   CL 103  --  104  --  106  --  106 105  --   CO2 29  --  29  --  28  --  30 32  --   GLUCOSE 114*  --  103*  --  82  --  85 123*  --   BUN 17  --  17  --  16  --  13 14  --   CREATININE 0.72   < > 0.76  --  0.74  --  0.69 0.61 0.69  CALCIUM 8.9  --  8.9  --  8.4*  --  8.6* 8.2*  --   MG  --   --   --  2.0 1.8 2.0 2.0 2.1  --   PHOS  --   --   --  4.0 3.6 3.9 3.7  --   --    < > = values in this interval not displayed.   GFR: Estimated Creatinine Clearance: 77.7 mL/min (by C-G formula based on SCr of 0.69 mg/dL). Liver Function Tests: Recent Labs  Lab 11/18/21 1926 11/19/21 0128  AST 25 22  ALT 17 15  ALKPHOS 59 59  BILITOT 0.2* 0.3  PROT 6.1* 6.0*  ALBUMIN 2.7* 2.7*   No results for input(s): LIPASE, AMYLASE in the last 168 hours. Recent Labs  Lab 11/19/21 0018  AMMONIA 21   Coagulation Profile: Recent Labs  Lab 11/18/21 1904  INR 1.0  Cardiac Enzymes: No results for input(s): CKTOTAL, CKMB, CKMBINDEX, TROPONINI in the last 168 hours. BNP (last 3 results) No results for input(s): PROBNP in the last 8760 hours. HbA1C: No results for input(s): HGBA1C in the last 72 hours. CBG: Recent Labs  Lab 11/24/21 1606 11/24/21 2003 11/24/21 2318 11/25/21 0406 11/25/21 0733  GLUCAP 107* 133* 93 113* 127*   Lipid Profile: No results for input(s): CHOL, HDL, LDLCALC, TRIG, CHOLHDL, LDLDIRECT in the last 72 hours. Thyroid Function Tests: No results for input(s): TSH, T4TOTAL, FREET4,  T3FREE, THYROIDAB in the last 72 hours. Anemia Panel: No results for input(s): VITAMINB12, FOLATE, FERRITIN, TIBC, IRON, RETICCTPCT in the last 72 hours. Sepsis Labs: Recent Labs  Lab 11/18/21 1904 11/19/21 0018 11/19/21 0834 11/20/21 0332 11/21/21 0350  PROCALCITON  --   --  <0.10 <0.10 <0.10  LATICACIDVEN 1.7 1.1  --   --   --     Recent Results (from the past 240 hour(s))  Blood Culture (routine x 2)     Status: None   Collection Time: 11/18/21  7:04 PM   Specimen: BLOOD  Result Value Ref Range Status   Specimen Description   Final    BLOOD BLOOD RIGHT HAND Performed at [facility], [address], [place], [place] [address]    Special Requests   Final    BOTTLES DRAWN AEROBIC AND ANAEROBIC Blood Culture results may not be optimal due to an inadequate volume of blood received in culture bottles Performed at [facility], [address], [place], [place] [address]    Culture   Final    NO GROWTH 5 DAYS Performed at [facility], [address], [place], [place] [address]    Report Status 11/23/2021 FINAL  Final  Urine Culture     Status: None   Collection Time: 11/18/21  7:04 PM   Specimen: In/Out Cath Urine  Result Value Ref Range Status   Specimen Description   Final    IN/OUT CATH URINE Performed at [facility], [address], [place], [place] [address]    Special Requests   Final    NONE Performed at [facility], [address] [place], [place] [address]    Culture   Final    NO GROWTH Performed at [facility], [address], [place], [place] [address]    Report Status 11/20/2021 FINAL  Final  Resp Panel by RT-PCR (Flu A&B, Covid) Urine, Clean Catch     Status: None   Collection Time: 11/18/21  7:18 PM   Specimen: Urine, Clean Catch; Nasopharyngeal(NP) swabs in vial transport medium  Result Value Ref Range Status   SARS Coronavirus 2 by RT PCR NEGATIVE NEGATIVE  Final    Comment: (NOTE) SARS-CoV-2 target nucleic acids are NOT DETECTED.  The SARS-CoV-2 RNA is generally detectable in upper respiratory specimens during the acute phase of infection. The lowest concentration of SARS-CoV-2 viral copies this assay can detect is 138 copies/mL. A negative result does not preclude SARS-Cov-2 infection and should not be used as the sole basis for treatment or other patient management decisions. A negative result may occur with  improper specimen collection/handling, submission of specimen other than nasopharyngeal swab, presence of viral mutation(s) within the areas targeted by this assay, and inadequate number of viral copies(<138 copies/mL). A negative result must be combined with clinical observations, patient history, and epidemiological information. The expected result is Negative.  Fact Sheet for  Patients:  EntrepreneurPulse.com.au  Fact Sheet for Healthcare Providers:  IncredibleEmployment.be  This test is no t yet approved or cleared by the [place] FDA and  has been authorized for detection and/or diagnosis of SARS-CoV-2 by FDA under an Emergency Use Authorization (EUA). This EUA will remain  in effect (meaning this test can be used) for the duration of the COVID-19 declaration under Section 564(b)(1) of the Act, 21 U.S.C.section 360bbb-3(b)(1), unless the authorization is terminated  or revoked sooner.       Influenza A by PCR NEGATIVE NEGATIVE Final   Influenza B by PCR NEGATIVE NEGATIVE Final    Comment: (NOTE) The Xpert Xpress SARS-CoV-2/FLU/RSV plus assay is intended as an aid in the diagnosis of influenza from Nasopharyngeal swab specimens and should not be used as a sole basis for treatment. Nasal washings and aspirates are unacceptable for Xpert Xpress SARS-CoV-2/FLU/RSV testing.  Fact Sheet for Patients: EntrepreneurPulse.com.au  Fact Sheet for Healthcare  Providers: IncredibleEmployment.be  This test is not yet approved or cleared by the [place] FDA and has been authorized for detection and/or diagnosis of SARS-CoV-2 by FDA under an Emergency Use Authorization (EUA). This EUA will remain in effect (meaning this test can be used) for the duration of the COVID-19 declaration under Section 564(b)(1) of the Act, 21 U.S.C. section 360bbb-3(b)(1), unless the authorization is terminated or revoked.  Performed at [facility] [place] [address] [place], [place] [address]   Blood Culture (routine x 2)     Status: None   Collection Time: 11/19/21 12:18 AM   Specimen: BLOOD LEFT ARM  Result Value Ref Range Status   Specimen Description   Final    BLOOD LEFT ARM Performed at [facility] [address] [place], [place] [address]    Special Requests   Final    BOTTLES DRAWN AEROBIC ONLY Blood Culture adequate volume Performed at [facility] [address], [place], [place] [address]    Culture   Final    NO GROWTH 5 DAYS Performed at [facility], [place] [address], [place],  [address]    Report Status 11/24/2021 FINAL  Final         Radiology Studies: CT ABDOMEN PELVIS WO CONTRAST  Result Date: 11/23/2021 CLINICAL DATA:  Preoperative evaluation for gastric tube placement. EXAM: CT ABDOMEN AND PELVIS WITHOUT CONTRAST TECHNIQUE: Multidetector CT imaging of the abdomen and pelvis was performed following the standard protocol without IV contrast. COMPARISON:  08/20/2014.  05/25/2012. FINDINGS: Lower chest: Bilateral pleural effusions larger on the left than the right with dependent atelectasis and or infiltrate, worse in the left lower lobe than the right lower lobe. Cardiomegaly. Small amount of pericardial fluid. Hepatobiliary: Liver appears normal without contrast. No calcified gallstones. Pancreas: Normal Spleen: Normal Adrenals/Urinary Tract:  Adrenal glands are normal. Left kidney is normal. Right kidney shows a 17 mm calcified mass in the ventral upper pole. This is not enlarged and is actually slightly smaller than was seen in 2013 and 2015. This was felt previously to represent a benign calcified hemorrhagic or proteinaceous cyst, and the follow-up would be consistent with that diagnosis. No progressive or worrisome finding. Bladder is thick-walled. Stomach/Bowel: Nasogastric tube enters the stomach. Stomach appears normally positioned. There is no interposed colon presently between the body and antrum of the stomach and the anterior abdominal wall. The inferior aspect of the left lobe does overlie the lesser curvature region. No small bowel pathology is seen. Question  the presence of a rectal mass. Digital examination recommended. Vascular/Lymphatic: Aortic atherosclerosis. No aneurysm. IVC is normal. No adenopathy. Reproductive: Normal except for enlarged prostate. Other: No free fluid or air. Small left inguinal hernia containing only fat. Musculoskeletal: This chronic degenerative changes affect the spine. IMPRESSION: Bilateral pleural effusions, larger on the left than the right, with atelectasis in both lower lobes, left worse than right. Cardiomegaly.  Pericardial effusion. No unexpected gastric anatomic findings. No interposed colon between the anterior wall of the body and antrum of the stomach and the anterior abdominal wall. Chronic 1.7 cm calcified region at the upper pole the right kidney, actually smaller than seen 79 years ago, consistent with a benign entity. See above discussion. Enlarged prostate. Question rectal mass versus proctitis. Thick-walled bladder suggesting cystitis or chronic outlet obstruction. Small left inguinal hernia containing only fat. Electronically Signed   By: Nelson Chimes M.D.   On: 11/23/2021 19:08        Scheduled Meds:  amoxicillin-clavulanate  500 mg Per Tube Q8H   vitamin C  250 mg Per Tube Daily    brinzolamide  1 drop Both Eyes TID   chlorhexidine  15 mL Mouth Rinse BID   donepezil  10 mg Per Tube QHS   enoxaparin (LOVENOX) injection  40 mg Subcutaneous Q24H   feeding supplement (PROSource TF)  45 mL Per Tube Daily   free water  150 mL Per Tube Q6H   latanoprost  1 drop Both Eyes QHS   levETIRAcetam  500 mg Per Tube BID   mouth rinse  15 mL Mouth Rinse BID   memantine  10 mg Per Tube QHS   multivitamin  15 mL Per Tube Daily   Netarsudil Dimesylate  1 drop Both Eyes QHS   polyethylene glycol  17 g Per Tube BID   risperiDONE  0.5 mg Per Tube QHS   valproic acid  1,000 mg Per Tube Daily   valproic acid  500 mg Per Tube QHS   zinc oxide   Topical BID   Continuous Infusions:  feeding supplement (JEVITY 1.2 CAL) 1,000 mL (11/24/21 2227)     LOS: 6 days    Time spent: 25 mins.More than 50% of that time was spent in counseling and/or coordination of care.      Shelly Coss, MD [facility] P12/12/2020, 7:59 AM

## 2021-11-26 ENCOUNTER — Inpatient Hospital Stay (HOSPITAL_COMMUNITY): Payer: Medicare Other

## 2021-11-26 HISTORY — PX: IR GASTROSTOMY TUBE MOD SED: IMG625

## 2021-11-26 LAB — CBC WITH DIFFERENTIAL/PLATELET
Abs Immature Granulocytes: 0.03 10*3/uL (ref 0.00–0.07)
Basophils Absolute: 0 10*3/uL (ref 0.0–0.1)
Basophils Relative: 0 %
Eosinophils Absolute: 0 10*3/uL (ref 0.0–0.5)
Eosinophils Relative: 0 %
HCT: 35.9 % — ABNORMAL LOW (ref 39.0–52.0)
Hemoglobin: 11.1 g/dL — ABNORMAL LOW (ref 13.0–17.0)
Immature Granulocytes: 0 %
Lymphocytes Relative: 7 %
Lymphs Abs: 0.7 10*3/uL (ref 0.7–4.0)
MCH: 26.9 pg (ref 26.0–34.0)
MCHC: 30.9 g/dL (ref 30.0–36.0)
MCV: 86.9 fL (ref 80.0–100.0)
Monocytes Absolute: 2.1 10*3/uL — ABNORMAL HIGH (ref 0.1–1.0)
Monocytes Relative: 23 %
Neutro Abs: 6.2 10*3/uL (ref 1.7–7.7)
Neutrophils Relative %: 70 %
Platelets: 190 10*3/uL (ref 150–400)
RBC: 4.13 MIL/uL — ABNORMAL LOW (ref 4.22–5.81)
RDW: 14.4 % (ref 11.5–15.5)
WBC: 9 10*3/uL (ref 4.0–10.5)
nRBC: 0 % (ref 0.0–0.2)

## 2021-11-26 LAB — GLUCOSE, CAPILLARY
Glucose-Capillary: 98 mg/dL (ref 70–99)
Glucose-Capillary: 99 mg/dL (ref 70–99)
Glucose-Capillary: 93 mg/dL (ref 70–99)
Glucose-Capillary: 107 mg/dL — ABNORMAL HIGH (ref 70–99)
Glucose-Capillary: 112 mg/dL — ABNORMAL HIGH (ref 70–99)

## 2021-11-26 LAB — BASIC METABOLIC PANEL
Anion gap: 5 (ref 5–15)
BUN: 20 mg/dL (ref 8–23)
CO2: 28 mmol/L (ref 22–32)
Calcium: 8.6 mg/dL — ABNORMAL LOW (ref 8.9–10.3)
Chloride: 104 mmol/L (ref 98–111)
Creatinine, Ser: 0.6 mg/dL — ABNORMAL LOW (ref 0.61–1.24)
GFR, Estimated: >60 mL/min (ref >60)
Glucose, Bld: 103 mg/dL — ABNORMAL HIGH (ref 70–99)
Potassium: 4.4 mmol/L (ref 3.5–5.1)
Sodium: 137 mmol/L (ref 135–145)

## 2021-11-26 MED ORDER — FENTANYL CITRATE (PF) 100 MCG/2ML IJ SOLN
INTRAMUSCULAR | Status: AC
  Filled 2021-11-26: qty 2

## 2021-11-26 MED ORDER — GLUCAGON HCL RDNA (DIAGNOSTIC) 1 MG IJ SOLR
INTRAMUSCULAR | Status: AC
  Filled 2021-11-26: qty 1

## 2021-11-26 MED ORDER — JEVITY 1.2 CAL PO LIQD
1000.0000 mL | ORAL | Status: DC
  Administered 2021-11-26 – 2021-12-05 (×8): 1000 mL
  Filled 2021-11-26 (×17): qty 1000

## 2021-11-26 MED ORDER — FENTANYL CITRATE (PF) 100 MCG/2ML IJ SOLN
INTRAMUSCULAR | Status: AC | PRN
  Administered 2021-11-26: 50 ug via INTRAVENOUS

## 2021-11-26 MED ORDER — MIDAZOLAM HCL 2 MG/2ML IJ SOLN
INTRAMUSCULAR | Status: AC | PRN
  Administered 2021-11-26: 1 mg via INTRAVENOUS

## 2021-11-26 MED ORDER — IOHEXOL 300 MG/ML  SOLN
50.0000 mL | Freq: Once | INTRAMUSCULAR | Status: AC | PRN
  Administered 2021-11-26: 15 mL

## 2021-11-26 MED ORDER — LIDOCAINE-EPINEPHRINE 1 %-1:100000 IJ SOLN
INTRAMUSCULAR | Status: AC | PRN
  Administered 2021-11-26: 10 mL

## 2021-11-26 MED ORDER — GLUCAGON HCL (RDNA) 1 MG IJ SOLR
INTRAMUSCULAR | Status: AC | PRN
  Administered 2021-11-26: 1 mg via INTRAVENOUS

## 2021-11-26 MED ORDER — SODIUM CHLORIDE 0.9 % IV SOLN
INTRAVENOUS | Status: AC | PRN
  Administered 2021-11-26: 10 mL/h via INTRAVENOUS

## 2021-11-26 MED ORDER — IOHEXOL 350 MG/ML SOLN
75.0000 mL | Freq: Once | INTRAVENOUS | Status: AC
  Administered 2021-11-26: 75 mL

## 2021-11-26 MED ORDER — CEFAZOLIN SODIUM-DEXTROSE 2-4 GM/100ML-% IV SOLN
INTRAVENOUS | Status: AC
  Filled 2021-11-26: qty 100

## 2021-11-26 MED ORDER — MIDAZOLAM HCL 2 MG/2ML IJ SOLN
INTRAMUSCULAR | Status: AC
  Filled 2021-11-26: qty 2

## 2021-11-26 MED ORDER — FREE WATER
150.0000 mL | Freq: Four times a day (QID) | Status: DC
  Administered 2021-11-26 – 2021-12-05 (×36): 150 mL

## 2021-11-26 NOTE — Progress Notes (Signed)
MEDICATION-RELATED CONSULT NOTE   IR Procedure Consult - Anticoagulant/Antiplatelet PTA/Inpatient Med List Review by Pharmacist    Procedure: 18 fr G tube    Completed: 11/26/2021 15:47  Post-Procedural bleeding risk per IR MD assessment:  standard  Antithrombotic medications on inpatient or PTA profile prior to procedure: LMWH 40 qday    Recommended restart time per IR Post-Procedure Guidelines:  Day +1, next AM    Plan:    LWMH 40 mg sq q24 to resumed 12/3 AM  Herby Abraham, Pharm.D 11/26/2021 3:56 PM

## 2021-11-26 NOTE — Progress Notes (Signed)
PROGRESS NOTE    KI CORBO  UYQ:[id number] DOB: 02/28/1935 DOA: 11/18/2021 PCP: Karna Dupes, MD   Chief Complain:Lethargy  Brief Narrative: Patient is a 86 year old male with history of seizure, Advanced dementia ,nursing home resident who was discharged from here  2 weeks ago for COVID-pneumonia, found to have lethargy on 11:24 AM, per nursing staff patient is normally talkative, aaox1, will even sing to the snf RN, but he was noticed to have increased crackles and rhonchi required suctioning .  He was suspected to have recurrent aspiration pneumonia, so sent here.currently on antibiotics for aspiration pneumonia.  Mental status slowly improving but he is severely dysphasic requiring feeding tube.Speech therapy following .  After discussion with family, we decided to go ahead and put PEG tube.  IR planning for PEG placement on 11/26/2021  Assessment & Plan:   Principal Problem:   Acute encephalopathy Active Problems:   Essential hypertension   Dementia (HCC)   Seizure (HCC)   Pressure injury of skin   Recurrent pneumonia   Acute metabolic encephalopathy   Acute metabolic encephalopathy, Possibly from recurrent aspiration pneumonia -CT head no acute findings, ammonia level unremarkable, valproic acid level below low therapeutic -Started on IV antibiotics on admission, changed to Augmentin, now he has  finished the course. --Blood culture no growth, urine culture no growth, COVID screening negative -Sputum culture ordered on 11/24, pending collection -Feeding tube placed on 11/25 -Mental status has improved   Left Pleural effusion Monitor, small to moderate as per CT.  Given a dose of  IV Lasix x1, echo showed diastolic dysfunction There are no lower extremity edema Since respiratory status is stable, we will not do thoracentesis at this time   right upper extremity edema venous Doppler negative for  DVT Elevate right arm   History of seizure On Depakote and  Keppra EEG :no seizure    Left hip pain Pelvic x-ray concerning for nondisplaced left femur fracture, recommend CT for further evaluation , CT did not reveal fracture   BPH Oral meds held, cant be given through tube    FTT/Dysphagia/high risk for aspiration Frequent falls in the past when he was at home, he was discharge to snf on 11/11,  per SNF RN" patient was only able to sit on edge of bed, but could not get out of bed.  Has not walked since discharged on 11/11" Family interested  on PEG placement if needed.  Wife says is okay to go ahead and plan for PEG .  He has high risks of aspiration in the future.  Plan for PEG placement on 11/26/2021.  Plan is to discharge him back to skilled nursing facility after that.   Advanced dementia/from nursing home: Continue supportive care. Delirium  precautions  Pressure Injury 05/08/21 Coccyx Mid Stage 1 -  Intact skin with non-blanchable redness of a localized area usually over a bony prominence. (Active)  05/08/21 0515  Location: Coccyx  Location Orientation: Mid  Staging: Stage 1 -  Intact skin with non-blanchable redness of a localized area usually over a bony prominence.  Wound Description (Comments):   Present on Admission: Yes     Pressure Injury 10/25/21 Heel Left Deep Tissue Pressure Injury - Purple or maroon localized area of discolored intact skin or blood-filled blister due to damage of underlying soft tissue from pressure and/or shear. purple wound on heel (Active)  10/25/21 0800  Location: Heel  Location Orientation: Left  Staging: Deep Tissue Pressure Injury - Purple or maroon  localized area of discolored intact skin or blood-filled blister due to damage of underlying soft tissue from pressure and/or shear.  Wound Description (Comments): purple wound on heel  Present on Admission:      Pressure Injury 10/25/21 Heel Right Deep Tissue Pressure Injury - Purple or maroon localized area of discolored intact skin or blood-filled  blister due to damage of underlying soft tissue from pressure and/or shear. purple wound on heel (Active)  10/25/21 0800  Location: Heel  Location Orientation: Right  Staging: Deep Tissue Pressure Injury - Purple or maroon localized area of discolored intact skin or blood-filled blister due to damage of underlying soft tissue from pressure and/or shear.  Wound Description (Comments): purple wound on heel  Present on Admission:      Pressure Injury 11/18/21 Sacrum (Active)  11/18/21 2357  Location: Sacrum  Location Orientation:   Staging:   Wound Description (Comments):   Present on Admission: Yes        Nutrition Problem: Inadequate oral intake Etiology: inability to eat      DVT prophylaxis:Lovenox Code Status: Full Family Communication: Wife at bedside on  11/24/2021 Patient status:  Dispo: The patient is from: SNF              Anticipated d/c is to: SNF/long term care              Anticipated d/c date is: waiting for PEG placement  Consultants: Palliative care  Procedures: Feeding tube placement  Antimicrobials:  Anti-infectives (From admission, onward)    Start     Dose/Rate Route Frequency Ordered Stop   11/26/21 1000  ceFAZolin (ANCEF) IVPB 2g/100 mL premix        2 g 200 mL/hr over 30 Minutes Intravenous To Radiology 11/25/21 1643 11/27/21 1000   11/26/21 0700  ceFAZolin (ANCEF) IVPB 2g/100 mL premix  Status:  Discontinued        2 g 200 mL/hr over 30 Minutes Intravenous To Radiology 11/24/21 1639 11/24/21 1640   11/25/21 1115  ceFAZolin (ANCEF) IVPB 2g/100 mL premix  Status:  Discontinued        2 g 200 mL/hr over 30 Minutes Intravenous To Radiology 11/25/21 1026 11/25/21 1643   11/22/21 1400  amoxicillin-clavulanate (AUGMENTIN) 250-62.5 MG/5ML suspension 500 mg        500 mg Per Tube Every 8 hours 11/22/21 1037 11/25/21 2216   11/19/21 1800  Ampicillin-Sulbactam (UNASYN) 3 g in sodium chloride 0.9 % 100 mL IVPB  Status:  Discontinued        3 g 200  mL/hr over 30 Minutes Intravenous Every 6 hours 11/19/21 1644 11/22/21 1036   11/19/21 0100  cefTRIAXone (ROCEPHIN) 2 g in sodium chloride 0.9 % 100 mL IVPB  Status:  Discontinued        2 g 200 mL/hr over 30 Minutes Intravenous Every 24 hours 11/18/21 2358 11/19/21 1621   11/19/21 0100  azithromycin (ZITHROMAX) 500 mg in sodium chloride 0.9 % 250 mL IVPB  Status:  Discontinued        500 mg 250 mL/hr over 60 Minutes Intravenous Every 24 hours 11/18/21 2358 11/19/21 1621   11/18/21 2145  vancomycin (VANCOREADY) IVPB 1500 mg/300 mL  Status:  Discontinued        1,500 mg 150 mL/hr over 120 Minutes Intravenous  Once 11/18/21 2137 11/19/21 0023   11/18/21 2145  ceFEPIme (MAXIPIME) 2 g in sodium chloride 0.9 % 100 mL IVPB  Status:  Discontinued  2 g 200 mL/hr over 30 Minutes Intravenous  Once 11/18/21 2138 11/19/21 0007   11/18/21 2130  ceFEPIme (MAXIPIME) 1 g in sodium chloride 0.9 % 100 mL IVPB  Status:  Discontinued        1 g 200 mL/hr over 30 Minutes Intravenous  Once 11/18/21 2125 11/18/21 2138       Subjective:  Patient seen and examined the bedside this morning.  Hemodynamically stable.  He was sleeping when I arrived, did not want to woke up and talked with close eye check, moaning.  Not in distress  Objective: Vitals:   11/25/21 1224 11/25/21 1950 11/26/21 0446 11/26/21 0500  BP: 105/67 108/63 117/84   Pulse: 81 71 79   Resp: 18 18 14    Temp: 98.3 F (36.8 C) 98.1 F (36.7 C) 98.2 F (36.8 C)   TempSrc: Oral Oral    SpO2: 90% 92% 95%   Weight:    80 kg  Height:        Intake/Output Summary (Last 24 hours) at 11/26/2021 0810 Last data filed at 11/26/2021 0500 Gross per 24 hour  Intake 450 ml  Output 1075 ml  Net -625 ml   Filed Weights   11/23/21 0439 11/25/21 0500 11/26/21 0500  Weight: 78 kg 80 kg 80 kg    Examination:     General exam: Very deconditioned, debilitated, chronically ill looking  HEENT: NG feeding tube Respiratory system:  no wheezes or  crackles  Cardiovascular system: S1 & S2 heard, RRR.  Gastrointestinal system: Abdomen is nondistended, soft and nontender. Central nervous system: Sleeping Extremities: Edema of the right upper extremity, no clubbing ,no cyanosis Skin: No rashes, no ulcers,no icterus     Data Reviewed: I have personally reviewed following labs and imaging studies  CBC: Recent Labs  Lab 11/20/21 0332 11/22/21 0401 11/26/21 0404  WBC 4.0 4.4 9.0  NEUTROABS  --  2.4 6.2  HGB 10.5* 11.1* 11.1*  HCT 33.9* 35.2* 35.9*  MCV 89.0 85.6 86.9  PLT 206 178 [id number]   Basic Metabolic Panel: Recent Labs  Lab 11/19/21 1901 11/20/21 0332 11/20/21 1706 11/21/21 0350 11/22/21 0401 11/25/21 0335 11/26/21 0404  NA  --  140  --  141 141  --  137  K  --  3.8  --  3.7 4.0  --  4.4  CL  --  106  --  106 105  --  104  CO2  --  28  --  30 32  --  28  GLUCOSE  --  82  --  85 123*  --  103*  BUN  --  16  --  13 14  --  20  CREATININE  --  0.74  --  0.69 0.61 0.69 0.60*  CALCIUM  --  8.4*  --  8.6* 8.2*  --  8.6*  MG 2.0 1.8 2.0 2.0 2.1  --   --   PHOS 4.0 3.6 3.9 3.7  --   --   --    GFR: Estimated Creatinine Clearance: 77.7 mL/min (A) (by C-G formula based on SCr of 0.6 mg/dL (L)). Liver Function Tests: No results for input(s): AST, ALT, ALKPHOS, BILITOT, PROT, ALBUMIN in the last 168 hours.  No results for input(s): LIPASE, AMYLASE in the last 168 hours. No results for input(s): AMMONIA in the last 168 hours.  Coagulation Profile: No results for input(s): INR, PROTIME in the last 168 hours.  Cardiac Enzymes: No results for input(s): CKTOTAL, CKMB,  CKMBINDEX, TROPONINI in the last 168 hours. BNP (last 3 results) No results for input(s): PROBNP in the last 8760 hours. HbA1C: No results for input(s): HGBA1C in the last 72 hours. CBG: Recent Labs  Lab 11/25/21 1642 11/25/21 1946 11/25/21 2352 11/26/21 0403 11/26/21 0733  GLUCAP 129* 125* 104* 98 99   Lipid Profile: No results for input(s): CHOL,  HDL, LDLCALC, TRIG, CHOLHDL, LDLDIRECT in the last 72 hours. Thyroid Function Tests: No results for input(s): TSH, T4TOTAL, FREET4, T3FREE, THYROIDAB in the last 72 hours. Anemia Panel: No results for input(s): VITAMINB12, FOLATE, FERRITIN, TIBC, IRON, RETICCTPCT in the last 72 hours. Sepsis Labs: Recent Labs  Lab 11/19/21 0834 11/20/21 0332 11/21/21 0350  PROCALCITON <0.10 <0.10 <0.10    Recent Results (from the past 240 hour(s))  Blood Culture (routine x 2)     Status: None   Collection Time: 11/18/21  7:04 PM   Specimen: BLOOD  Result Value Ref Range Status   Specimen Description   Final    BLOOD BLOOD RIGHT HAND Performed at [facility], [address] [place], [place] [address]    Special Requests   Final    BOTTLES DRAWN AEROBIC AND ANAEROBIC Blood Culture results may not be optimal due to an inadequate volume of blood received in culture bottles Performed at [facility], [address], [place], [place] [address]    Culture   Final    NO GROWTH 5 DAYS Performed at [facility], [address] [place], [place] [address]    Report Status 11/23/2021 FINAL  Final  Urine Culture     Status: None   Collection Time: 11/18/21  7:04 PM   Specimen: In/Out Cath Urine  Result Value Ref Range Status   Specimen Description   Final    IN/OUT CATH URINE Performed at [facility], [address] [place], [place] [address]    Special Requests   Final    NONE Performed at [facility], [address], [place], [place] [address]    Culture   Final    NO GROWTH Performed at [facility], [address], [place], [place] [address]    Report Status 11/20/2021 FINAL  Final  Resp Panel by RT-PCR (Flu A&B, Covid) Urine, Clean Catch     Status: None   Collection Time: 11/18/21  7:18 PM   Specimen: Urine, Clean Catch; Nasopharyngeal(NP) swabs in vial transport medium  Result Value Ref Range  Status   SARS Coronavirus 2 by RT PCR NEGATIVE NEGATIVE Final    Comment: (NOTE) SARS-CoV-2 target nucleic acids are NOT DETECTED.  The SARS-CoV-2 RNA is generally detectable in upper respiratory specimens during the acute phase of infection. The lowest concentration of SARS-CoV-2 viral copies this assay can detect is 138 copies/mL. A negative result does not preclude SARS-Cov-2 infection and should not be used as the sole basis for treatment or other patient management decisions. A negative result may occur with  improper specimen collection/handling, submission of specimen other than nasopharyngeal swab, presence of viral mutation(s) within the areas targeted by this assay, and inadequate number of viral copies(<138 copies/mL). A negative result must be combined with clinical observations, patient history, and epidemiological information. The expected result is Negative.  Fact Sheet for Patients:  BloggerCourse.com  Fact Sheet for Healthcare Providers:  SeriousBroker.it  This test is no t yet approved or cleared by the [place] and  has been authorized  for detection and/or diagnosis of SARS-CoV-2 by FDA under an Emergency Use Authorization (EUA). This EUA will remain  in effect (meaning this test can be used) for the duration of the COVID-19 declaration under Section 564(b)(1) of the Act, 21 U.S.C.section 360bbb-3(b)(1), unless the authorization is terminated  or revoked sooner.       Influenza A by PCR NEGATIVE NEGATIVE Final   Influenza B by PCR NEGATIVE NEGATIVE Final    Comment: (NOTE) The Xpert Xpress SARS-CoV-2/FLU/RSV plus assay is intended as an aid in the diagnosis of influenza from Nasopharyngeal swab specimens and should not be used as a sole basis for treatment. Nasal washings and aspirates are unacceptable for Xpert Xpress SARS-CoV-2/FLU/RSV testing.  Fact Sheet for  Patients: EntrepreneurPulse.com.au  Fact Sheet for Healthcare Providers: IncredibleEmployment.be  This test is not yet approved or cleared by the [place] FDA and has been authorized for detection and/or diagnosis of SARS-CoV-2 by FDA under an Emergency Use Authorization (EUA). This EUA will remain in effect (meaning this test can be used) for the duration of the COVID-19 declaration under Section 564(b)(1) of the Act, 21 U.S.C. section 360bbb-3(b)(1), unless the authorization is terminated or revoked.  Performed at [facility], [place] [address] [place], [place] [address]   Blood Culture (routine x 2)     Status: None   Collection Time: 11/19/21 12:18 AM   Specimen: BLOOD LEFT ARM  Result Value Ref Range Status   Specimen Description   Final    BLOOD LEFT ARM Performed at [facility] [address], [place], [place] [address]    Special Requests   Final    BOTTLES DRAWN AEROBIC ONLY Blood Culture adequate volume Performed at [place] [address], [place], [place] [address]    Culture   Final    NO GROWTH 5 DAYS Performed at [facility], [place] [address] [place], [place] [address]    Report Status 11/24/2021 FINAL  Final         Radiology Studies: No results found.      Scheduled Meds:  vitamin C  250 mg Per Tube Daily   brinzolamide  1 drop Both Eyes TID   chlorhexidine  15 mL Mouth Rinse BID   donepezil  10 mg Per Tube QHS   enoxaparin (LOVENOX) injection  40 mg Subcutaneous Q24H   feeding supplement (PROSource TF)  45 mL Per Tube Daily   free water  150 mL Per Tube Q6H   latanoprost  1 drop Both Eyes QHS   levETIRAcetam  500 mg Per Tube BID   mouth rinse  15 mL Mouth Rinse BID   memantine  10 mg Per Tube QHS   multivitamin  15 mL Per Tube Daily   Netarsudil Dimesylate  1 drop Both Eyes QHS   risperiDONE  0.5 mg Per Tube QHS   valproic acid   1,000 mg Per Tube Daily   valproic acid  500 mg Per Tube QHS   zinc oxide   Topical BID   Continuous Infusions:   ceFAZolin (ANCEF) IV     feeding supplement (JEVITY 1.2 CAL) Stopped (11/26/21 0153)     LOS: 7 days    Time spent: 25 mins.More than 50% of that time was spent in counseling and/or coordination of care.      Shelly Coss, MD [facility] P12/01/2021, 8:10 AM

## 2021-11-26 NOTE — Progress Notes (Signed)
SLP Cancellation Note  Patient Details Name: [name] [name] MRN: [id number] DOB: 1935/06/05   Discharge note: Pt is scheduled for PEG today. He has severe dysphagia per [facility] 11/28.   No further SLP f/u is warranted at this time.  Our service will sign off.  Monico Sudduth L. [name], MA CCC/SLP Acute Rehabilitation Services Office number [phone] Pager [phone]       [name] [name] 11/26/2021, 1:49 PM

## 2021-11-26 NOTE — Procedures (Signed)
Interventional Radiology Procedure Note  Procedure: 18 fr G tube  Indication: Dysphagia  Findings: Please refer to procedural dictation for full description.  Complications: None  EBL: < 10 mL  Acquanetta Belling, MD [phone]

## 2021-11-26 NOTE — TOC Progression Note (Signed)
Transition of Care [facility]) - Progression Note    Patient Details  Name: [name] [name] MRN: [id number] Date of Birth: 20-Nov-1935  Transition of Care [facility] CM/SW Contact  Geni Bers, RN Phone Number: 11/26/2021, 2:18 PM  Clinical Narrative:    Spoke with pt's wife concerning discharge and no other SNF offering pt a bed. Wife asked if she could appeal pt's discharge. Explained the process to pt's wife.    Expected Discharge Plan: Skilled Nursing Facility Barriers to Discharge: Continued Medical Work up  Expected Discharge Plan and Services Expected Discharge Plan: Skilled Nursing Facility                                               Social Determinants of Health (SDOH) Interventions    Readmission Risk Interventions Readmission Risk Prevention Plan 10/22/2021  Transportation Screening Complete  Medication Review (RN Care Manager) Referral to Pharmacy  PCP or Specialist appointment within 3-5 days of discharge Complete  HRI or Home Care Consult Not Complete  HRI or Home Care Consult Pt Refusal Comments from SNF  SW Recovery Care/Counseling Consult Complete  Palliative Care Screening Not Applicable  Skilled Nursing Facility Complete  Some recent data might be hidden

## 2021-11-27 LAB — GLUCOSE, CAPILLARY
Glucose-Capillary: 115 mg/dL — ABNORMAL HIGH (ref 70–99)
Glucose-Capillary: 123 mg/dL — ABNORMAL HIGH (ref 70–99)
Glucose-Capillary: 129 mg/dL — ABNORMAL HIGH (ref 70–99)
Glucose-Capillary: 131 mg/dL — ABNORMAL HIGH (ref 70–99)
Glucose-Capillary: 134 mg/dL — ABNORMAL HIGH (ref 70–99)
Glucose-Capillary: 138 mg/dL — ABNORMAL HIGH (ref 70–99)

## 2021-11-27 NOTE — Progress Notes (Signed)
PROGRESS NOTE    DARAIL NEUBAUER  [id number] DOB: 1935/04/11 DOA: 11/18/2021 PCP: Raymondo Band, MD   Chief Complain:Lethargy  Brief Narrative: Patient is a 86 year old male with history of seizure, Advanced dementia ,nursing home resident who was discharged from here  2 weeks ago for COVID-pneumonia, found to have lethargy on 11:24 AM, per nursing staff patient is normally talkative, aaox1, will even sing to the snf RN, but he was noticed to have increased crackles and rhonchi required suctioning .  He was suspected to have recurrent aspiration pneumonia, so sent here.currently on antibiotics for aspiration pneumonia.  Mental status slowly improving but he is severely dysphasic requiring feeding tube.Speech therapy following .  After discussion with family, we decided to go ahead and put PEG tube.  IR performed PEG placement on 11/26/2021.  Patient is medically stable for discharge to skilled nursing facility as soon as bed is available.  Assessment & Plan:   Principal Problem:   Acute encephalopathy Active Problems:   Essential hypertension   Dementia (HCC)   Seizure (HCC)   Pressure injury of skin   Recurrent pneumonia   Acute metabolic encephalopathy   Acute metabolic encephalopathy, Possibly from recurrent aspiration pneumonia -CT head no acute findings, ammonia level unremarkable, valproic acid level below low therapeutic -Started on IV antibiotics on admission, changed to Augmentin, now he has  finished the course. --Blood culture no growth, urine culture no growth, COVID screening negative -Mental status has improved but he remains confused   Left Pleural effusion Monitor, small to moderate as per CT.  Given a dose of  IV Lasix x1, echo showed diastolic dysfunction There are no lower extremity edema Since respiratory status is stable, we will not do thoracentesis at this time   right upper extremity edema venous Doppler negative for  DVT Elevate right arm    History of seizure On Depakote and Keppra EEG :no seizure    Left hip pain Pelvic x-ray concerning for nondisplaced left femur fracture, recommend CT for further evaluation , CT did not reveal fracture   BPH Oral meds held, cant be given through tube    FTT/Dysphagia/high risk for aspiration Frequent falls in the past when he was at home, he was discharge to snf on 11/11,  per SNF RN" patient was only able to sit on edge of bed, but could not get out of bed.  Has not walked since discharged on 11/11" Family interested  on PEG placement if needed.  Wife says is okay to go ahead and plan for PEG .  He has high risks of aspiration in the future.  Underwent PEG tube placement on 11/26/2021.  Plan is to discharge him back to skilled nursing facility after that.   Advanced dementia/from nursing home: Continue supportive care. Delirium  precautions.  On risperidone at bedtime.  Plan is to discharge discussed with sleep.  Wife wants different  facility  Pressure Injury 05/08/21 Coccyx Mid Stage 1 -  Intact skin with non-blanchable redness of a localized area usually over a bony prominence. (Active)  05/08/21 0515  Location: Coccyx  Location Orientation: Mid  Staging: Stage 1 -  Intact skin with non-blanchable redness of a localized area usually over a bony prominence.  Wound Description (Comments):   Present on Admission: Yes     Pressure Injury 10/25/21 Heel Left Deep Tissue Pressure Injury - Purple or maroon localized area of discolored intact skin or blood-filled blister due to damage of underlying soft tissue from pressure  and/or shear. purple wound on heel (Active)  10/25/21 0800  Location: Heel  Location Orientation: Left  Staging: Deep Tissue Pressure Injury - Purple or maroon localized area of discolored intact skin or blood-filled blister due to damage of underlying soft tissue from pressure and/or shear.  Wound Description (Comments): purple wound on heel  Present on Admission:       Pressure Injury 10/25/21 Heel Right Deep Tissue Pressure Injury - Purple or maroon localized area of discolored intact skin or blood-filled blister due to damage of underlying soft tissue from pressure and/or shear. purple wound on heel (Active)  10/25/21 0800  Location: Heel  Location Orientation: Right  Staging: Deep Tissue Pressure Injury - Purple or maroon localized area of discolored intact skin or blood-filled blister due to damage of underlying soft tissue from pressure and/or shear.  Wound Description (Comments): purple wound on heel  Present on Admission:      Pressure Injury 11/18/21 Sacrum (Active)  11/18/21 2357  Location: Sacrum  Location Orientation:   Staging:   Wound Description (Comments):   Present on Admission: Yes        Nutrition Problem: Inadequate oral intake Etiology: inability to eat      DVT prophylaxis:Lovenox Code Status: Full Family Communication: Wife at bedside on  11/24/2021 Patient status:  Dispo: The patient is from: SNF              Anticipated d/c is to: SNF/long term care              Anticipated d/c date is: Medically stable for discharge  Consultants: Palliative care  Procedures: Feeding tube placement  Antimicrobials:  Anti-infectives (From admission, onward)    Start     Dose/Rate Route Frequency Ordered Stop   11/26/21 1000  ceFAZolin (ANCEF) IVPB 2g/100 mL premix        2 g 200 mL/hr over 30 Minutes Intravenous To Radiology 11/25/21 1643 11/26/21 1557   11/26/21 0700  ceFAZolin (ANCEF) IVPB 2g/100 mL premix  Status:  Discontinued        2 g 200 mL/hr over 30 Minutes Intravenous To Radiology 11/24/21 1639 11/24/21 1640   11/25/21 1115  ceFAZolin (ANCEF) IVPB 2g/100 mL premix  Status:  Discontinued        2 g 200 mL/hr over 30 Minutes Intravenous To Radiology 11/25/21 1026 11/25/21 1643   11/22/21 1400  amoxicillin-clavulanate (AUGMENTIN) 250-62.5 MG/5ML suspension 500 mg        500 mg Per Tube Every 8 hours 11/22/21 1037  11/25/21 2216   11/19/21 1800  Ampicillin-Sulbactam (UNASYN) 3 g in sodium chloride 0.9 % 100 mL IVPB  Status:  Discontinued        3 g 200 mL/hr over 30 Minutes Intravenous Every 6 hours 11/19/21 1644 11/22/21 1036   11/19/21 0100  cefTRIAXone (ROCEPHIN) 2 g in sodium chloride 0.9 % 100 mL IVPB  Status:  Discontinued        2 g 200 mL/hr over 30 Minutes Intravenous Every 24 hours 11/18/21 2358 11/19/21 1621   11/19/21 0100  azithromycin (ZITHROMAX) 500 mg in sodium chloride 0.9 % 250 mL IVPB  Status:  Discontinued        500 mg 250 mL/hr over 60 Minutes Intravenous Every 24 hours 11/18/21 2358 11/19/21 1621   11/18/21 2145  vancomycin (VANCOREADY) IVPB 1500 mg/300 mL  Status:  Discontinued        1,500 mg 150 mL/hr over 120 Minutes Intravenous  Once 11/18/21 2137 11/19/21  0023   11/18/21 2145  ceFEPIme (MAXIPIME) 2 g in sodium chloride 0.9 % 100 mL IVPB  Status:  Discontinued        2 g 200 mL/hr over 30 Minutes Intravenous  Once 11/18/21 2138 11/19/21 0007   11/18/21 2130  ceFEPIme (MAXIPIME) 1 g in sodium chloride 0.9 % 100 mL IVPB  Status:  Discontinued        1 g 200 mL/hr over 30 Minutes Intravenous  Once 11/18/21 2125 11/18/21 2138       Subjective:  Patient seen and examined at the bedside this morning.  Hemodynamically stable.  He got a PEG tube today.  He was sleeping when I arrived .  He answered my questions by keeping his eyes closed.  Objective: Vitals:   11/26/21 1549 11/26/21 1624 11/26/21 1950 11/27/21 0425  BP:  99/60 (!) 138/93 (!) 104/59  Pulse:  73 76 69  Resp:   16 14  Temp:  98.3 F (36.8 C) 98.2 F (36.8 C) 98.2 F (36.8 C)  TempSrc:  Oral Oral   SpO2: 98% 97% 91% 92%  Weight:      Height:        Intake/Output Summary (Last 24 hours) at 11/27/2021 0723 Last data filed at 11/27/2021 [id number] Gross per 24 hour  Intake 1412.95 ml  Output 725 ml  Net 687.95 ml   Filed Weights   11/23/21 0439 11/25/21 0500 11/26/21 0500  Weight: 78 kg 80 kg 80 kg     Examination:     General exam: Very deconditioned, debilitated, chronically ill looking  HEENT: NG feeding tube Respiratory system:  no wheezes or crackles  Cardiovascular system: S1 & S2 heard, RRR.  Gastrointestinal system: Abdomen is nondistended, soft and nontender.PEG tube Central nervous system: Not alert,confused Extremities: Edema of the right upper extremity, no clubbing ,no cyanosis Skin: No rashes, no ulcers,no icterus     Data Reviewed: I have personally reviewed following labs and imaging studies  CBC: Recent Labs  Lab 11/22/21 0401 11/26/21 0404  WBC 4.4 9.0  NEUTROABS 2.4 6.2  HGB 11.1* 11.1*  HCT 35.2* 35.9*  MCV 85.6 86.9  PLT 178 [id number]   Basic Metabolic Panel: Recent Labs  Lab 11/20/21 1706 11/21/21 0350 11/22/21 0401 11/25/21 0335 11/26/21 0404  NA  --  141 141  --  137  K  --  3.7 4.0  --  4.4  CL  --  106 105  --  104  CO2  --  30 32  --  28  GLUCOSE  --  85 123*  --  103*  BUN  --  13 14  --  20  CREATININE  --  0.69 0.61 0.69 0.60*  CALCIUM  --  8.6* 8.2*  --  8.6*  MG 2.0 2.0 2.1  --   --   PHOS 3.9 3.7  --   --   --    GFR: Estimated Creatinine Clearance: 77.7 mL/min (A) (by C-G formula based on SCr of 0.6 mg/dL (L)). Liver Function Tests: No results for input(s): AST, ALT, ALKPHOS, BILITOT, PROT, ALBUMIN in the last 168 hours.  No results for input(s): LIPASE, AMYLASE in the last 168 hours. No results for input(s): AMMONIA in the last 168 hours.  Coagulation Profile: No results for input(s): INR, PROTIME in the last 168 hours.  Cardiac Enzymes: No results for input(s): CKTOTAL, CKMB, CKMBINDEX, TROPONINI in the last 168 hours. BNP (last 3 results) No results for input(s): PROBNP  in the last 8760 hours. HbA1C: No results for input(s): HGBA1C in the last 72 hours. CBG: Recent Labs  Lab 11/26/21 1147 11/26/21 1657 11/26/21 1946 11/27/21 0007 11/27/21 0420  GLUCAP 93 107* 112* 138* 131*   Lipid Profile: No results for  input(s): CHOL, HDL, LDLCALC, TRIG, CHOLHDL, LDLDIRECT in the last 72 hours. Thyroid Function Tests: No results for input(s): TSH, T4TOTAL, FREET4, T3FREE, THYROIDAB in the last 72 hours. Anemia Panel: No results for input(s): VITAMINB12, FOLATE, FERRITIN, TIBC, IRON, RETICCTPCT in the last 72 hours. Sepsis Labs: Recent Labs  Lab 11/21/21 0350  PROCALCITON <0.10    Recent Results (from the past 240 hour(s))  Blood Culture (routine x 2)     Status: None   Collection Time: 11/18/21  7:04 PM   Specimen: BLOOD  Result Value Ref Range Status   Specimen Description   Final    BLOOD BLOOD RIGHT HAND Performed at [facility], [address] [place], [place] [address]    Special Requests   Final    BOTTLES DRAWN AEROBIC AND ANAEROBIC Blood Culture results may not be optimal due to an inadequate volume of blood received in culture bottles Performed at [facility], [address], [place], [place] [address]    Culture   Final    NO GROWTH 5 DAYS Performed at [facility], [address] [place], [place] [address]    Report Status 11/23/2021 FINAL  Final  Urine Culture     Status: None   Collection Time: 11/18/21  7:04 PM   Specimen: In/Out Cath Urine  Result Value Ref Range Status   Specimen Description   Final    IN/OUT CATH URINE Performed at [facility], [address], [place], [place] [address]    Special Requests   Final    NONE Performed at [facility], [address], [place], [place] [address]    Culture   Final    NO GROWTH Performed at [facility], [address], [place], [place] [address]    Report Status 11/20/2021 FINAL  Final  Resp Panel by RT-PCR (Flu A&B, Covid) Urine, Clean Catch     Status: None   Collection Time: 11/18/21  7:18 PM   Specimen: Urine, Clean Catch; Nasopharyngeal(NP) swabs in vial transport medium  Result Value Ref Range Status   SARS Coronavirus 2  by RT PCR NEGATIVE NEGATIVE Final    Comment: (NOTE) SARS-CoV-2 target nucleic acids are NOT DETECTED.  The SARS-CoV-2 RNA is generally detectable in upper respiratory specimens during the acute phase of infection. The lowest concentration of SARS-CoV-2 viral copies this assay can detect is 138 copies/mL. A negative result does not preclude SARS-Cov-2 infection and should not be used as the sole basis for treatment or other patient management decisions. A negative result may occur with  improper specimen collection/handling, submission of specimen other than nasopharyngeal swab, presence of viral mutation(s) within the areas targeted by this assay, and inadequate number of viral copies(<138 copies/mL). A negative result must be combined with clinical observations, patient history, and epidemiological information. The expected result is Negative.  Fact Sheet for Patients:  BloggerCourse.com  Fact Sheet for Healthcare Providers:  SeriousBroker.it  This test is no t yet approved or cleared by the [place] FDA and  has been authorized for detection and/or diagnosis of SARS-CoV-2 by FDA under an Emergency Use Authorization (EUA). This EUA will remain  in effect (meaning  this test can be used) for the duration of the COVID-19 declaration under Section 564(b)(1) of the Act, 21 U.S.C.section 360bbb-3(b)(1), unless the authorization is terminated  or revoked sooner.       Influenza A by PCR NEGATIVE NEGATIVE Final   Influenza B by PCR NEGATIVE NEGATIVE Final    Comment: (NOTE) The Xpert Xpress SARS-CoV-2/FLU/RSV plus assay is intended as an aid in the diagnosis of influenza from Nasopharyngeal swab specimens and should not be used as a sole basis for treatment. Nasal washings and aspirates are unacceptable for Xpert Xpress SARS-CoV-2/FLU/RSV testing.  Fact Sheet for Patients: EntrepreneurPulse.com.au  Fact  Sheet for Healthcare Providers: IncredibleEmployment.be  This test is not yet approved or cleared by the [place] FDA and has been authorized for detection and/or diagnosis of SARS-CoV-2 by FDA under an Emergency Use Authorization (EUA). This EUA will remain in effect (meaning this test can be used) for the duration of the COVID-19 declaration under Section 564(b)(1) of the Act, 21 U.S.C. section 360bbb-3(b)(1), unless the authorization is terminated or revoked.  Performed at [facility], [place] [address], [place], [place] [address]   Blood Culture (routine x 2)     Status: None   Collection Time: 11/19/21 12:18 AM   Specimen: BLOOD LEFT ARM  Result Value Ref Range Status   Specimen Description   Final    BLOOD LEFT ARM Performed at [facility] [address], [place], [place] [address]    Special Requests   Final    BOTTLES DRAWN AEROBIC ONLY Blood Culture adequate volume Performed at [facility] [address] [place], [place] [address]    Culture   Final    NO GROWTH 5 DAYS Performed at [facility], [place] [address], [place], [place] [address]    Report Status 11/24/2021 FINAL  Final         Radiology Studies: IR GASTROSTOMY TUBE MOD SED  Result Date: 11/26/2021 INDICATION: 86 year old gentleman with dementia, pneumonia, dysphagia requires feeding tube access. EXAM: Percutaneous gastrostomy tube placement MEDICATIONS: Ancef 2 g IV; Antibiotics were administered within 1 hour of the procedure. Glucagon 1 mg IV ANESTHESIA/SEDATION: Moderate (conscious) sedation was employed during this procedure. A total of Versed 1 mg and Fentanyl 50 mcg was administered intravenously by the radiology nurse. Total intra-service moderate Sedation Time: 16 minutes. The patient's level of consciousness and vital signs were monitored continuously by radiology nursing throughout the procedure under my direct  supervision. CONTRAST:  15 mL of Omnipaque 300-administered into the gastric lumen. FLUOROSCOPY TIME:  Fluoroscopy Time: 1 minutes 42 seconds (8 mGy). COMPLICATIONS: None immediate. PROCEDURE: Informed written consent was obtained from the patient after a thorough discussion of the procedural risks, benefits and alternatives. All questions were addressed. Maximal Sterile Barrier Technique was utilized including caps, mask, sterile gowns, sterile gloves, sterile drape, hand hygiene and skin antiseptic. A timeout was performed prior to the initiation of the procedure. The left hepatic lobe margin was marked utilizing ultrasound guidance. The epigastric region was prepped and draped in the usual sterile fashion. The stomach was insufflated utilizing the NG tube. Following local lidocaine administration, 2 gastropexies were placed to secure the anterior wall of the stomach to the anterior abdominal wall. Percutaneous access obtained into the gastric antrum at the center of the gastropexies with an 18 gauge needle. Guide wire advanced into the gastric lumen. Serial dilation was performed and peel-away sheath was placed. 61 French gastrostomy tube inserted over  the guidewire into the gastric lumen. The G tube retention balloon was inflated with 10 mL of dilute contrast and retracted to the anterior gastric wall. Contrast administrated through the gastrostomy tube opacified the gastric lumen. The insertion site was covered with sterile dressing. IMPRESSION: 12 French gastrostomy tube placement as above. Electronically Signed   By: Miachel Roux M.D.   On: 11/26/2021 16:12        Scheduled Meds:  vitamin C  250 mg Per Tube Daily   brinzolamide  1 drop Both Eyes TID   chlorhexidine  15 mL Mouth Rinse BID   donepezil  10 mg Per Tube QHS   enoxaparin (LOVENOX) injection  40 mg Subcutaneous Q24H   feeding supplement (PROSource TF)  45 mL Per Tube Daily   free water  150 mL Per Tube Q6H   latanoprost  1 drop Both  Eyes QHS   levETIRAcetam  500 mg Per Tube BID   mouth rinse  15 mL Mouth Rinse BID   memantine  10 mg Per Tube QHS   multivitamin  15 mL Per Tube Daily   Netarsudil Dimesylate  1 drop Both Eyes QHS   risperiDONE  0.5 mg Per Tube QHS   valproic acid  1,000 mg Per Tube Daily   valproic acid  500 mg Per Tube QHS   zinc oxide   Topical BID   Continuous Infusions:  feeding supplement (JEVITY 1.2 CAL) 1,000 mL (11/26/21 1750)     LOS: 8 days    Time spent: 25 mins.More than 50% of that time was spent in counseling and/or coordination of care.      Shelly Coss, MD [facility] P12/02/2021, 7:23 AM

## 2021-11-27 NOTE — Progress Notes (Signed)
Pt NT suctioned for a moderate amt of thick, yellow secretions.  Pt tolerated fairly well, no apparent complications noted.

## 2021-11-27 NOTE — Progress Notes (Signed)
   Palliative Medicine Inpatient Follow Up Note     Chart Reviewed. Patient assessed at the bedside.   Patient somnolent. Observed snoring. Will briefly open eyes but remains asleep throughout assessment and visit. Cortrak in place. PEG tube placed on yesterday remains clamped. His daughter is at the bedside.   Discussed goals of care. Daughter expresses awareness of plans and family anticipating patient will return to long-term care facility next week.   Although PEG tube has been placed Mr. Karasik's prognosis remains poor. He seems to be sleeping more than awake, deconditioned, bedbound, minimal to no interaction. PEG tube will provide artifical nutrition and could possibly prolong life however he has minimal chance of a meaningful recovery despite these efforts as requested by family.   Joah is appropriate for hospice outpatient given poor quality of life and poor long-term prognosis if family was to be in agreement. However, wife and family is clear with expressed wishes to continue to treat aggressively to allow him every opportunity to thrive and stay alive.   Highly encouraged ongoing goals of care discussions with emphasis on code status.   Discussed the importance of continued conversation with family and their  medical providers regarding overall plan of care and treatment options, ensuring decisions are within the context of the patients values and GOCs.  Questions addressed and support provided.    Objective Assessment: Vital Signs Vitals:   11/27/21 1319 11/27/21 2156  BP: 116/64 131/67  Pulse: 61 76  Resp: 18 18  Temp: 98.5 F (36.9 C) 99.4 F (37.4 C)  SpO2: 96% 96%    Intake/Output Summary (Last 24 hours) at 11/27/2021 2312 Last data filed at 11/27/2021 1215 Gross per 24 hour  Intake 710 ml  Output 400 ml  Net 310 ml    Last Weight  Most recent update: 11/26/2021  5:02 AM    Weight  80 kg (176 lb 5.9 oz)            Gen:  Somnolent, chronically-ill  appearing  HEENT: moist mucous membranes, right nare Cortrak in place CV: RRR PULM: diminished bilaterally  ABD: soft/nontender/nondistended/normal bowel sounds, PEG in place (clamped)  Neuro: somnolent, dementia, unable to assess, does not follow commands.  SUMMARY OF RECOMMENDATIONS   Continue with current plan of care per medical team  PEG tube has been placed as requested by family. Despite placement and artificial feedings Roper's prognosis remains poor. I do not anticipate a meaningful recovery. Family has been clear that they are remaining hopeful and wishes for all efforts including full code to allow patient and opportunity to thrive.  Highly encouraged ongoing goals of care discussions and consideration of DNR.  Plans are confirmed for patient to return to [facility]) once bed available.  Recommendations for outpatient palliative at minimum to continue with ongoing goals of care discussions.  PMT will continue to support and follow on as needed basis. Please secure chat for urgent needs.   Time Total: 40 min.   Visit consisted of counseling and education dealing with the complex and emotionally intense issues of symptom management and palliative care in the setting of serious and potentially life-threatening illness.Greater than 50%  of this time was spent counseling and coordinating care related to the above assessment and plan.  Willette Alma, AGPCNP-BC  Palliative Medicine Team [phone]

## 2021-11-27 NOTE — Progress Notes (Signed)
RT NOTE:  Pt NT suctioned by RT. Obtained a moderate amount of thick clear/white secretions. Pt tolerated okay with no complications noted.

## 2021-11-28 LAB — BASIC METABOLIC PANEL
Anion gap: 4 — ABNORMAL LOW (ref 5–15)
BUN: 22 mg/dL (ref 8–23)
CO2: 30 mmol/L (ref 22–32)
Calcium: 8.3 mg/dL — ABNORMAL LOW (ref 8.9–10.3)
Chloride: 104 mmol/L (ref 98–111)
Creatinine, Ser: 0.45 mg/dL — ABNORMAL LOW (ref 0.61–1.24)
GFR, Estimated: >60 mL/min (ref >60)
Glucose, Bld: 107 mg/dL — ABNORMAL HIGH (ref 70–99)
Potassium: 4.3 mmol/L (ref 3.5–5.1)
Sodium: 138 mmol/L (ref 135–145)

## 2021-11-28 LAB — CBC WITH DIFFERENTIAL/PLATELET
Abs Immature Granulocytes: 0.02 10*3/uL (ref 0.00–0.07)
Basophils Absolute: 0 10*3/uL (ref 0.0–0.1)
Basophils Relative: 1 %
Eosinophils Absolute: 0.3 10*3/uL (ref 0.0–0.5)
Eosinophils Relative: 5 %
HCT: 33 % — ABNORMAL LOW (ref 39.0–52.0)
Hemoglobin: 10 g/dL — ABNORMAL LOW (ref 13.0–17.0)
Immature Granulocytes: 0 %
Lymphocytes Relative: 14 %
Lymphs Abs: 0.7 10*3/uL (ref 0.7–4.0)
MCH: 26.4 pg (ref 26.0–34.0)
MCHC: 30.3 g/dL (ref 30.0–36.0)
MCV: 87.1 fL (ref 80.0–100.0)
Monocytes Absolute: 1.2 10*3/uL — ABNORMAL HIGH (ref 0.1–1.0)
Monocytes Relative: 23 %
Neutro Abs: 3 10*3/uL (ref 1.7–7.7)
Neutrophils Relative %: 57 %
Platelets: 247 10*3/uL (ref 150–400)
RBC: 3.79 MIL/uL — ABNORMAL LOW (ref 4.22–5.81)
RDW: 14.4 % (ref 11.5–15.5)
WBC: 5.2 10*3/uL (ref 4.0–10.5)
nRBC: 0 % (ref 0.0–0.2)

## 2021-11-28 LAB — GLUCOSE, CAPILLARY
Glucose-Capillary: 102 mg/dL — ABNORMAL HIGH (ref 70–99)
Glucose-Capillary: 105 mg/dL — ABNORMAL HIGH (ref 70–99)
Glucose-Capillary: 109 mg/dL — ABNORMAL HIGH (ref 70–99)
Glucose-Capillary: 116 mg/dL — ABNORMAL HIGH (ref 70–99)
Glucose-Capillary: 94 mg/dL (ref 70–99)
Glucose-Capillary: 98 mg/dL (ref 70–99)

## 2021-11-28 MED ORDER — GUAIFENESIN ER 600 MG PO TB12: 600.0000 mg | ORAL_TABLET | Freq: Two times a day (BID) | ORAL | Status: DC

## 2021-11-28 MED ORDER — GUAIFENESIN 100 MG/5ML PO LIQD
10.0000 mL | Freq: Four times a day (QID) | ORAL | Status: DC
  Administered 2021-11-28 – 2021-12-05 (×29): 10 mL
  Filled 2021-11-28 (×29): qty 10

## 2021-11-28 NOTE — Progress Notes (Signed)
PROGRESS NOTE    [name] [name]  [id number] DOB: 1935/09/18 DOA: 11/18/2021 PCP: Raymondo Band, MD   Chief Complain:Lethargy  Brief Narrative: Patient is a 86 year old male with history of seizure, Advanced dementia ,nursing home resident who was discharged from here  2 weeks ago for COVID-pneumonia, found to have lethargy on 11:24 AM, per nursing staff patient is normally talkative, aaox1, will even sing to the snf RN, but he was noticed to have increased crackles and rhonchi required suctioning .  He was suspected to have recurrent aspiration pneumonia, so sent here.currently on antibiotics for aspiration pneumonia.  Mental status slowly improving but he is severely dysphasic requiring feeding tube.Speech therapy following .  After discussion with family, we decided to go ahead and put PEG tube.  IR performed PEG placement on 11/26/2021.  Patient is medically stable for discharge to skilled nursing facility as soon as bed is available.  Assessment & Plan:   Principal Problem:   Acute encephalopathy Active Problems:   Essential hypertension   Dementia (HCC)   Seizure (HCC)   Pressure injury of skin   Recurrent pneumonia   Acute metabolic encephalopathy   Acute metabolic encephalopathy, Possibly from recurrent aspiration pneumonia -CT head no acute findings, ammonia level unremarkable, valproic acid level below low therapeutic -Started on IV antibiotics on admission, changed to Augmentin, now he has  finished the course. --Blood culture no growth, urine culture no growth, COVID screening negative -Mental status has improved but he remains confused,sleepy -Risperidone discontinued   Left Pleural effusion Monitor, small to moderate as per CT.  Given a dose of  IV Lasix x1, echo showed diastolic dysfunction There are no lower extremity edema Since respiratory status is stable, we will not do thoracentesis at this time   right upper extremity edema venous Doppler negative  for  DVT Elevate right arm   History of seizure On Depakote and Keppra EEG :no seizure    Left hip pain Pelvic x-ray concerning for nondisplaced left femur fracture, recommend CT for further evaluation , CT did not reveal fracture   BPH Oral meds held, cant be given through tube    FTT/Dysphagia/high risk for aspiration Frequent falls in the past when he was at home, he was discharge to snf on 11/11,  per SNF RN" patient was only able to sit on edge of bed, but could not get out of bed.  Has not walked since discharged on 11/11" Family interested  on PEG placement if needed.  Wife says is okay to go ahead and plan for PEG .  He has high risks of aspiration in the future.  Underwent PEG tube placement on 11/26/2021.  Plan is to discharge him back to skilled nursing facility after that.   Advanced dementia/from nursing home: Continue supportive care. Delirium  precautions.  On risperidone at bedtime,on hold now due to excessive sleepiness.  Plan is to discharge to SNF.  Wife wants different  facility  Pressure Injury 05/08/21 Coccyx Mid Stage 1 -  Intact skin with non-blanchable redness of a localized area usually over a bony prominence. (Active)  05/08/21 0515  Location: Coccyx  Location Orientation: Mid  Staging: Stage 1 -  Intact skin with non-blanchable redness of a localized area usually over a bony prominence.  Wound Description (Comments):   Present on Admission: Yes     Pressure Injury 10/25/21 Heel Left Deep Tissue Pressure Injury - Purple or maroon localized area of discolored intact skin or blood-filled blister due to  damage of underlying soft tissue from pressure and/or shear. purple wound on heel (Active)  10/25/21 0800  Location: Heel  Location Orientation: Left  Staging: Deep Tissue Pressure Injury - Purple or maroon localized area of discolored intact skin or blood-filled blister due to damage of underlying soft tissue from pressure and/or shear.  Wound Description  (Comments): purple wound on heel  Present on Admission:      Pressure Injury 10/25/21 Heel Right Deep Tissue Pressure Injury - Purple or maroon localized area of discolored intact skin or blood-filled blister due to damage of underlying soft tissue from pressure and/or shear. purple wound on heel (Active)  10/25/21 0800  Location: Heel  Location Orientation: Right  Staging: Deep Tissue Pressure Injury - Purple or maroon localized area of discolored intact skin or blood-filled blister due to damage of underlying soft tissue from pressure and/or shear.  Wound Description (Comments): purple wound on heel  Present on Admission:      Pressure Injury 11/18/21 Sacrum (Active)  11/18/21 2357  Location: Sacrum  Location Orientation:   Staging:   Wound Description (Comments):   Present on Admission: Yes        Nutrition Problem: Inadequate oral intake Etiology: inability to eat      DVT prophylaxis:Lovenox Code Status: Full Family Communication: Wife at bedside on  11/24/2021.Called again today for update, call not received Patient status:  Dispo: The patient is from: SNF              Anticipated d/c is to: SNF/long term care              Anticipated d/c date is: Medically stable for discharge  Consultants: Palliative care  Procedures: Feeding tube placement  Antimicrobials:  Anti-infectives (From admission, onward)    Start     Dose/Rate Route Frequency Ordered Stop   11/26/21 1000  ceFAZolin (ANCEF) IVPB 2g/100 mL premix        2 g 200 mL/hr over 30 Minutes Intravenous To Radiology 11/25/21 1643 11/26/21 1557   11/26/21 0700  ceFAZolin (ANCEF) IVPB 2g/100 mL premix  Status:  Discontinued        2 g 200 mL/hr over 30 Minutes Intravenous To Radiology 11/24/21 1639 11/24/21 1640   11/25/21 1115  ceFAZolin (ANCEF) IVPB 2g/100 mL premix  Status:  Discontinued        2 g 200 mL/hr over 30 Minutes Intravenous To Radiology 11/25/21 1026 11/25/21 1643   11/22/21 1400   amoxicillin-clavulanate (AUGMENTIN) 250-62.5 MG/5ML suspension 500 mg        500 mg Per Tube Every 8 hours 11/22/21 1037 11/25/21 2216   11/19/21 1800  Ampicillin-Sulbactam (UNASYN) 3 g in sodium chloride 0.9 % 100 mL IVPB  Status:  Discontinued        3 g 200 mL/hr over 30 Minutes Intravenous Every 6 hours 11/19/21 1644 11/22/21 1036   11/19/21 0100  cefTRIAXone (ROCEPHIN) 2 g in sodium chloride 0.9 % 100 mL IVPB  Status:  Discontinued        2 g 200 mL/hr over 30 Minutes Intravenous Every 24 hours 11/18/21 2358 11/19/21 1621   11/19/21 0100  azithromycin (ZITHROMAX) 500 mg in sodium chloride 0.9 % 250 mL IVPB  Status:  Discontinued        500 mg 250 mL/hr over 60 Minutes Intravenous Every 24 hours 11/18/21 2358 11/19/21 1621   11/18/21 2145  vancomycin (VANCOREADY) IVPB 1500 mg/300 mL  Status:  Discontinued  1,500 mg 150 mL/hr over 120 Minutes Intravenous  Once 11/18/21 2137 11/19/21 0023   11/18/21 2145  ceFEPIme (MAXIPIME) 2 g in sodium chloride 0.9 % 100 mL IVPB  Status:  Discontinued        2 g 200 mL/hr over 30 Minutes Intravenous  Once 11/18/21 2138 11/19/21 0007   11/18/21 2130  ceFEPIme (MAXIPIME) 1 g in sodium chloride 0.9 % 100 mL IVPB  Status:  Discontinued        1 g 200 mL/hr over 30 Minutes Intravenous  Once 11/18/21 2125 11/18/21 2138       Subjective:  Patient seen and examined the bedside this morning.  Hemodynamically stable, remains confused, eyes closed.  Talks with closed eyes.  Not in distress .Appears to have some cough with  sputum.  Objective: Vitals:   11/27/21 0425 11/27/21 1319 11/27/21 2156 11/28/21 0453  BP: (!) 104/59 116/64 131/67 (!) 118/59  Pulse: 69 61 76 65  Resp: 14 18 18 19   Temp: 98.2 F (36.8 C) 98.5 F (36.9 C) 99.4 F (37.4 C) 98.9 F (37.2 C)  TempSrc:  Oral Oral Oral  SpO2: 92% 96% 96% 98%  Weight:      Height:        Intake/Output Summary (Last 24 hours) at 11/28/2021 0756 Last data filed at 11/28/2021 0700 Gross per 24  hour  Intake 2130 ml  Output 850 ml  Net 1280 ml   Filed Weights   11/23/21 0439 11/25/21 0500 11/26/21 0500  Weight: 78 kg 80 kg 80 kg    Examination:     General exam: Very deconditioned, debilitated, chronically ill looking  HEENT: Eyes closed Respiratory system:  no wheezes or crackles  Cardiovascular system: S1 & S2 heard, RRR.  Gastrointestinal system: Abdomen is nondistended, soft and nontender.  PEG Central nervous system: Sleepy, confused  extremities: No edema, no clubbing ,no cyanosis Skin: Pressure ulcers as above  Data Reviewed: I have personally reviewed following labs and imaging studies  CBC: Recent Labs  Lab 11/22/21 0401 11/26/21 0404 11/28/21 0348  WBC 4.4 9.0 5.2  NEUTROABS 2.4 6.2 3.0  HGB 11.1* 11.1* 10.0*  HCT 35.2* 35.9* 33.0*  MCV 85.6 86.9 87.1  PLT 178 190 [id number]   Basic Metabolic Panel: Recent Labs  Lab 11/22/21 0401 11/25/21 0335 11/26/21 0404 11/28/21 0348  NA 141  --  137 138  K 4.0  --  4.4 4.3  CL 105  --  104 104  CO2 32  --  28 30  GLUCOSE 123*  --  103* 107*  BUN 14  --  20 22  CREATININE 0.61 0.69 0.60* 0.45*  CALCIUM 8.2*  --  8.6* 8.3*  MG 2.1  --   --   --    GFR: Estimated Creatinine Clearance: 77.7 mL/min (A) (by C-G formula based on SCr of 0.45 mg/dL (L)). Liver Function Tests: No results for input(s): AST, ALT, ALKPHOS, BILITOT, PROT, ALBUMIN in the last 168 hours.  No results for input(s): LIPASE, AMYLASE in the last 168 hours. No results for input(s): AMMONIA in the last 168 hours.  Coagulation Profile: No results for input(s): INR, PROTIME in the last 168 hours.  Cardiac Enzymes: No results for input(s): CKTOTAL, CKMB, CKMBINDEX, TROPONINI in the last 168 hours. BNP (last 3 results) No results for input(s): PROBNP in the last 8760 hours. HbA1C: No results for input(s): HGBA1C in the last 72 hours. CBG: Recent Labs  Lab 11/27/21 1641 11/27/21 2153 11/28/21  0053 11/28/21 0448 11/28/21 0725  GLUCAP  129* 115* 109* 116* 102*   Lipid Profile: No results for input(s): CHOL, HDL, LDLCALC, TRIG, CHOLHDL, LDLDIRECT in the last 72 hours. Thyroid Function Tests: No results for input(s): TSH, T4TOTAL, FREET4, T3FREE, THYROIDAB in the last 72 hours. Anemia Panel: No results for input(s): VITAMINB12, FOLATE, FERRITIN, TIBC, IRON, RETICCTPCT in the last 72 hours. Sepsis Labs: No results for input(s): PROCALCITON, LATICACIDVEN in the last 168 hours.   Recent Results (from the past 240 hour(s))  Blood Culture (routine x 2)     Status: None   Collection Time: 11/18/21  7:04 PM   Specimen: BLOOD  Result Value Ref Range Status   Specimen Description   Final    BLOOD BLOOD RIGHT HAND Performed at [facility] [address] [place], [place] [address]    Special Requests   Final    BOTTLES DRAWN AEROBIC AND ANAEROBIC Blood Culture results may not be optimal due to an inadequate volume of blood received in culture bottles Performed at [facility] [address], [place], [place] [address]    Culture   Final    NO GROWTH 5 DAYS Performed at [facility], [place] [address], [place], [place] [address]    Report Status 11/23/2021 FINAL  Final  Urine Culture     Status: None   Collection Time: 11/18/21  7:04 PM   Specimen: In/Out Cath Urine  Result Value Ref Range Status   Specimen Description   Final    IN/OUT CATH URINE Performed at [facility] [address], [place], [place] [address]    Special Requests   Final    NONE Performed at [facility], [place] [address], [place], [place] [address]    Culture   Final    NO GROWTH Performed at [facility], [place] [address] [place], [place] [address]    Report Status 11/20/2021 FINAL  Final  Resp Panel by RT-PCR (Flu A&B, Covid) Urine, Clean Catch     Status: None   Collection Time: 11/18/21  7:18 PM   Specimen: Urine, Clean Catch; Nasopharyngeal(NP) swabs  in vial transport medium  Result Value Ref Range Status   SARS Coronavirus 2 by RT PCR NEGATIVE NEGATIVE Final    Comment: (NOTE) SARS-CoV-2 target nucleic acids are NOT DETECTED.  The SARS-CoV-2 RNA is generally detectable in upper respiratory specimens during the acute phase of infection. The lowest concentration of SARS-CoV-2 viral copies this assay can detect is 138 copies/mL. A negative result does not preclude SARS-Cov-2 infection and should not be used as the sole basis for treatment or other patient management decisions. A negative result may occur with  improper specimen collection/handling, submission of specimen other than nasopharyngeal swab, presence of viral mutation(s) within the areas targeted by this assay, and inadequate number of viral copies(<138 copies/mL). A negative result must be combined with clinical observations, patient history, and epidemiological information. The expected result is Negative.  Fact Sheet for Patients:  EntrepreneurPulse.com.au  Fact Sheet for Healthcare Providers:  IncredibleEmployment.be  This test is no t yet approved or cleared by the [place] FDA and  has been authorized for detection and/or diagnosis of SARS-CoV-2 by FDA under an Emergency Use Authorization (EUA). This EUA will remain  in effect (meaning this test can be used) for the duration of the COVID-19 declaration under Section 564(b)(1) of the Act, 21 U.S.C.section 360bbb-3(b)(1), unless the authorization is  terminated  or revoked sooner.       Influenza A by PCR NEGATIVE NEGATIVE Final   Influenza B by PCR NEGATIVE NEGATIVE Final    Comment: (NOTE) The Xpert Xpress SARS-CoV-2/FLU/RSV plus assay is intended as an aid in the diagnosis of influenza from Nasopharyngeal swab specimens and should not be used as a sole basis for treatment. Nasal washings and aspirates are unacceptable for Xpert Xpress  SARS-CoV-2/FLU/RSV testing.  Fact Sheet for Patients: EntrepreneurPulse.com.au  Fact Sheet for Healthcare Providers: IncredibleEmployment.be  This test is not yet approved or cleared by the [place] FDA and has been authorized for detection and/or diagnosis of SARS-CoV-2 by FDA under an Emergency Use Authorization (EUA). This EUA will remain in effect (meaning this test can be used) for the duration of the COVID-19 declaration under Section 564(b)(1) of the Act, 21 U.S.C. section 360bbb-3(b)(1), unless the authorization is terminated or revoked.  Performed at [facility], [place] [address] [place], [place] [address]   Blood Culture (routine x 2)     Status: None   Collection Time: 11/19/21 12:18 AM   Specimen: BLOOD LEFT ARM  Result Value Ref Range Status   Specimen Description   Final    BLOOD LEFT ARM Performed at [facility] [address] [place], [place] [address]    Special Requests   Final    BOTTLES DRAWN AEROBIC ONLY Blood Culture adequate volume Performed at [facility] [address] [place], [place] [address]    Culture   Final    NO GROWTH 5 DAYS Performed at [facility], [place] [address], [place], [place] [address]    Report Status 11/24/2021 FINAL  Final         Radiology Studies: IR GASTROSTOMY TUBE MOD SED  Result Date: 11/26/2021 INDICATION: 86 year old gentleman with dementia, pneumonia, dysphagia requires feeding tube access. EXAM: Percutaneous gastrostomy tube placement MEDICATIONS: Ancef 2 g IV; Antibiotics were administered within 1 hour of the procedure. Glucagon 1 mg IV ANESTHESIA/SEDATION: Moderate (conscious) sedation was employed during this procedure. A total of Versed 1 mg and Fentanyl 50 mcg was administered intravenously by the radiology nurse. Total intra-service moderate Sedation Time: 16 minutes. The patient's level of  consciousness and vital signs were monitored continuously by radiology nursing throughout the procedure under my direct supervision. CONTRAST:  15 mL of Omnipaque 300-administered into the gastric lumen. FLUOROSCOPY TIME:  Fluoroscopy Time: 1 minutes 42 seconds (8 mGy). COMPLICATIONS: None immediate. PROCEDURE: Informed written consent was obtained from the patient after a thorough discussion of the procedural risks, benefits and alternatives. All questions were addressed. Maximal Sterile Barrier Technique was utilized including caps, mask, sterile gowns, sterile gloves, sterile drape, hand hygiene and skin antiseptic. A timeout was performed prior to the initiation of the procedure. The left hepatic lobe margin was marked utilizing ultrasound guidance. The epigastric region was prepped and draped in the usual sterile fashion. The stomach was insufflated utilizing the NG tube. Following local lidocaine administration, 2 gastropexies were placed to secure the anterior wall of the stomach to the anterior abdominal wall. Percutaneous access obtained into the gastric antrum at the center of the gastropexies with an 18 gauge needle. Guide wire advanced into the gastric lumen. Serial dilation was performed and peel-away sheath was placed. 30 French gastrostomy tube inserted over the guidewire into the gastric lumen. The G tube retention balloon was inflated with 10 mL of dilute contrast and retracted to the anterior gastric  wall. Contrast administrated through the gastrostomy tube opacified the gastric lumen. The insertion site was covered with sterile dressing. IMPRESSION: 70 French gastrostomy tube placement as above. Electronically Signed   By: Acquanetta Belling M.D.   On: 11/26/2021 16:12        Scheduled Meds:  vitamin C  250 mg Per Tube Daily   brinzolamide  1 drop Both Eyes TID   chlorhexidine  15 mL Mouth Rinse BID   donepezil  10 mg Per Tube QHS   enoxaparin (LOVENOX) injection  40 mg Subcutaneous Q24H    feeding supplement (PROSource TF)  45 mL Per Tube Daily   free water  150 mL Per Tube Q6H   latanoprost  1 drop Both Eyes QHS   levETIRAcetam  500 mg Per Tube BID   mouth rinse  15 mL Mouth Rinse BID   memantine  10 mg Per Tube QHS   multivitamin  15 mL Per Tube Daily   Netarsudil Dimesylate  1 drop Both Eyes QHS   risperiDONE  0.5 mg Per Tube QHS   valproic acid  1,000 mg Per Tube Daily   valproic acid  500 mg Per Tube QHS   zinc oxide   Topical BID   Continuous Infusions:  feeding supplement (JEVITY 1.2 CAL) 1,000 mL (11/27/21 0911)     LOS: 9 days    Time spent: 25 mins.More than 50% of that time was spent in counseling and/or coordination of care.      [name], MD [facility] P12/03/2021, 7:56 AM

## 2021-11-29 LAB — GLUCOSE, CAPILLARY
Glucose-Capillary: 106 mg/dL — ABNORMAL HIGH (ref 70–99)
Glucose-Capillary: 106 mg/dL — ABNORMAL HIGH (ref 70–99)
Glucose-Capillary: 107 mg/dL — ABNORMAL HIGH (ref 70–99)
Glucose-Capillary: 109 mg/dL — ABNORMAL HIGH (ref 70–99)
Glucose-Capillary: 121 mg/dL — ABNORMAL HIGH (ref 70–99)
Glucose-Capillary: 89 mg/dL (ref 70–99)

## 2021-11-29 NOTE — Progress Notes (Signed)
[name] [name] [address] [facility]) Hospital Liaison note:  This is a pending outpatient-based Palliative Care patient. Will continue to follow for disposition.  Please call with any outpatient palliative questions or concerns.  Thank you, Abran Cantor, LPN [facility] Liaison [phone]

## 2021-11-29 NOTE — Progress Notes (Signed)
PROGRESS NOTE    [name] [name]  [id number] DOB: 07-04-1935 DOA: 11/18/2021 PCP: Raymondo Band, MD   Chief Complain:Lethargy  Brief Narrative: Patient is a 86 year old male with history of seizure, Advanced dementia ,nursing home resident who was discharged from here  2 weeks ago for COVID-pneumonia, found to have lethargy on 11:24 AM, per nursing staff patient is normally talkative, aaox1, will even sing to the snf RN, but he was noticed to have increased crackles and rhonchi required suctioning .  He was suspected to have recurrent aspiration pneumonia, so sent here.currently on antibiotics for aspiration pneumonia.  Mental status slowly improving but he is severely dysphasic requiring feeding tube.Speech therapy following .  After discussion with family, we decided to go ahead and put PEG tube.  IR performed PEG placement on 11/26/2021.  Patient is medically stable for discharge to skilled nursing facility as soon as bed is available.  Assessment & Plan:   Principal Problem:   Acute encephalopathy Active Problems:   Essential hypertension   Dementia (HCC)   Seizure (HCC)   Pressure injury of skin   Recurrent pneumonia   Acute metabolic encephalopathy   Acute metabolic encephalopathy, Possibly from recurrent aspiration pneumonia -CT head no acute findings, ammonia level unremarkable, valproic acid level below low therapeutic -Started on IV antibiotics on admission, changed to Augmentin, now he has  finished the course. --Blood culture no growth, urine culture no growth, COVID screening negative -Mental status has improved after risperidone has been discontinued   Left Pleural effusion Monitor, small to moderate as per CT.  Given a dose of  IV Lasix x1, echo showed diastolic dysfunction There are no lower extremity edema Since respiratory status is stable, we will not do thoracentesis at this time   right upper extremity edema venous Doppler negative for  DVT Elevate  right arm   History of seizure On Depakote and Keppra EEG :no seizure    Left hip pain Pelvic x-ray concerning for nondisplaced left femur fracture, recommend CT for further evaluation , CT did not reveal fracture   BPH Oral meds held, cant be given through tube    FTT/Dysphagia/high risk for aspiration Frequent falls in the past when he was at home, he was discharge to snf on 11/11,  per SNF RN" patient was only able to sit on edge of bed, but could not get out of bed.  Has not walked since discharged on 11/11" Family interested  on PEG placement if needed.  Wife says is okay to go ahead and plan for PEG .  He has high risks of aspiration in the future.  Underwent PEG tube placement on 11/26/2021.  Plan is to discharge him back to skilled nursing facility    Advanced dementia/from nursing home: Continue supportive care. Delirium  precautions.  On risperidone at bedtime,on hold now due to excessive sleepiness.  Plan is to discharge to SNF.  His previous facility is not accepting him anymore because he has a balance to be paid  Pressure Injury 05/08/21 Coccyx Mid Stage 1 -  Intact skin with non-blanchable redness of a localized area usually over a bony prominence. (Active)  05/08/21 0515  Location: Coccyx  Location Orientation: Mid  Staging: Stage 1 -  Intact skin with non-blanchable redness of a localized area usually over a bony prominence.  Wound Description (Comments):   Present on Admission: Yes     Pressure Injury 10/25/21 Heel Left Deep Tissue Pressure Injury - Purple or maroon localized area  of discolored intact skin or blood-filled blister due to damage of underlying soft tissue from pressure and/or shear. purple wound on heel (Active)  10/25/21 0800  Location: Heel  Location Orientation: Left  Staging: Deep Tissue Pressure Injury - Purple or maroon localized area of discolored intact skin or blood-filled blister due to damage of underlying soft tissue from pressure and/or  shear.  Wound Description (Comments): purple wound on heel  Present on Admission:      Pressure Injury 10/25/21 Heel Right Deep Tissue Pressure Injury - Purple or maroon localized area of discolored intact skin or blood-filled blister due to damage of underlying soft tissue from pressure and/or shear. purple wound on heel (Active)  10/25/21 0800  Location: Heel  Location Orientation: Right  Staging: Deep Tissue Pressure Injury - Purple or maroon localized area of discolored intact skin or blood-filled blister due to damage of underlying soft tissue from pressure and/or shear.  Wound Description (Comments): purple wound on heel  Present on Admission:      Pressure Injury 11/18/21 Sacrum (Active)  11/18/21 2357  Location: Sacrum  Location Orientation:   Staging:   Wound Description (Comments):   Present on Admission: Yes        Nutrition Problem: Inadequate oral intake Etiology: inability to eat      DVT prophylaxis:Lovenox Code Status: Full Family Communication:: Discussed with the wife on phone on 12/5 Patient status:  Dispo: The patient is from: SNF              Anticipated d/c is to: SNF/long term care              Anticipated d/c date is: Medically stable for discharge  Consultants: Palliative care  Procedures: PEG tube placement  Antimicrobials:  Anti-infectives (From admission, onward)    Start     Dose/Rate Route Frequency Ordered Stop   11/26/21 1000  ceFAZolin (ANCEF) IVPB 2g/100 mL premix        2 g 200 mL/hr over 30 Minutes Intravenous To Radiology 11/25/21 1643 11/26/21 1557   11/26/21 0700  ceFAZolin (ANCEF) IVPB 2g/100 mL premix  Status:  Discontinued        2 g 200 mL/hr over 30 Minutes Intravenous To Radiology 11/24/21 1639 11/24/21 1640   11/25/21 1115  ceFAZolin (ANCEF) IVPB 2g/100 mL premix  Status:  Discontinued        2 g 200 mL/hr over 30 Minutes Intravenous To Radiology 11/25/21 1026 11/25/21 1643   11/22/21 1400  amoxicillin-clavulanate  (AUGMENTIN) 250-62.5 MG/5ML suspension 500 mg        500 mg Per Tube Every 8 hours 11/22/21 1037 11/25/21 2216   11/19/21 1800  Ampicillin-Sulbactam (UNASYN) 3 g in sodium chloride 0.9 % 100 mL IVPB  Status:  Discontinued        3 g 200 mL/hr over 30 Minutes Intravenous Every 6 hours 11/19/21 1644 11/22/21 1036   11/19/21 0100  cefTRIAXone (ROCEPHIN) 2 g in sodium chloride 0.9 % 100 mL IVPB  Status:  Discontinued        2 g 200 mL/hr over 30 Minutes Intravenous Every 24 hours 11/18/21 2358 11/19/21 1621   11/19/21 0100  azithromycin (ZITHROMAX) 500 mg in sodium chloride 0.9 % 250 mL IVPB  Status:  Discontinued        500 mg 250 mL/hr over 60 Minutes Intravenous Every 24 hours 11/18/21 2358 11/19/21 1621   11/18/21 2145  vancomycin (VANCOREADY) IVPB 1500 mg/300 mL  Status:  Discontinued  1,500 mg 150 mL/hr over 120 Minutes Intravenous  Once 11/18/21 2137 11/19/21 0023   11/18/21 2145  ceFEPIme (MAXIPIME) 2 g in sodium chloride 0.9 % 100 mL IVPB  Status:  Discontinued        2 g 200 mL/hr over 30 Minutes Intravenous  Once 11/18/21 2138 11/19/21 0007   11/18/21 2130  ceFEPIme (MAXIPIME) 1 g in sodium chloride 0.9 % 100 mL IVPB  Status:  Discontinued        1 g 200 mL/hr over 30 Minutes Intravenous  Once 11/18/21 2125 11/18/21 2138       Subjective:  Patient seen and examined the bedside this morning.  Hemodynamically stable.  His mental status has improved today.  He was more alert and awake.  He talked to me with open eyes.  Not oriented to though.  Objective: Vitals:   11/28/21 0453 11/28/21 1159 11/28/21 2054 11/29/21 0427  BP: (!) 118/59 (!) 117/56 (!) 142/91 140/83  Pulse: 65 66 (!) 57 63  Resp: 19 18 17    Temp: 98.9 F (37.2 C) 99.9 F (37.7 C) 98 F (36.7 C) 97.6 F (36.4 C)  TempSrc: Oral Oral Oral Oral  SpO2: 98% 98% 100% 100%  Weight:      Height:        Intake/Output Summary (Last 24 hours) at 11/29/2021 0755 Last data filed at 11/29/2021 0600 Gross per 24  hour  Intake 1680 ml  Output 700 ml  Net 980 ml   Filed Weights   11/23/21 0439 11/25/21 0500 11/26/21 0500  Weight: 78 kg 80 kg 80 kg    Examination:     General exam: Very deconditioned, debilitated, chronically ill looking  Respiratory system:  no wheezes or crackles  Cardiovascular system: S1 & S2 heard, RRR.  Gastrointestinal system: Abdomen is nondistended, soft and nontender.  PEG Central nervous system: awake but confused  extremities: edema of the right upper extremity, no clubbing ,no cyanosis Skin: Pressure ulcers as above   Data Reviewed: I have personally reviewed following labs and imaging studies  CBC: Recent Labs  Lab 11/26/21 0404 11/28/21 0348  WBC 9.0 5.2  NEUTROABS 6.2 3.0  HGB 11.1* 10.0*  HCT 35.9* 33.0*  MCV 86.9 87.1  PLT 190 [id number]   Basic Metabolic Panel: Recent Labs  Lab 11/25/21 0335 11/26/21 0404 11/28/21 0348  NA  --  137 138  K  --  4.4 4.3  CL  --  104 104  CO2  --  28 30  GLUCOSE  --  103* 107*  BUN  --  20 22  CREATININE 0.69 0.60* 0.45*  CALCIUM  --  8.6* 8.3*   GFR: Estimated Creatinine Clearance: 77.7 mL/min (A) (by C-G formula based on SCr of 0.45 mg/dL (L)). Liver Function Tests: No results for input(s): AST, ALT, ALKPHOS, BILITOT, PROT, ALBUMIN in the last 168 hours.  No results for input(s): LIPASE, AMYLASE in the last 168 hours. No results for input(s): AMMONIA in the last 168 hours.  Coagulation Profile: No results for input(s): INR, PROTIME in the last 168 hours.  Cardiac Enzymes: No results for input(s): CKTOTAL, CKMB, CKMBINDEX, TROPONINI in the last 168 hours. BNP (last 3 results) No results for input(s): PROBNP in the last 8760 hours. HbA1C: No results for input(s): HGBA1C in the last 72 hours. CBG: Recent Labs  Lab 11/28/21 1712 11/28/21 2056 11/29/21 0015 11/29/21 0428 11/29/21 0732  GLUCAP 98 94 106* 121* 107*   Lipid Profile: No results for  input(s): CHOL, HDL, LDLCALC, TRIG, CHOLHDL,  LDLDIRECT in the last 72 hours. Thyroid Function Tests: No results for input(s): TSH, T4TOTAL, FREET4, T3FREE, THYROIDAB in the last 72 hours. Anemia Panel: No results for input(s): VITAMINB12, FOLATE, FERRITIN, TIBC, IRON, RETICCTPCT in the last 72 hours. Sepsis Labs: No results for input(s): PROCALCITON, LATICACIDVEN in the last 168 hours.   No results found for this or any previous visit (from the past 240 hour(s)).        Radiology Studies: No results found.      Scheduled Meds:  vitamin C  250 mg Per Tube Daily   brinzolamide  1 drop Both Eyes TID   chlorhexidine  15 mL Mouth Rinse BID   donepezil  10 mg Per Tube QHS   enoxaparin (LOVENOX) injection  40 mg Subcutaneous Q24H   feeding supplement (PROSource TF)  45 mL Per Tube Daily   free water  150 mL Per Tube Q6H   guaiFENesin  10 mL Per Tube Q6H   latanoprost  1 drop Both Eyes QHS   levETIRAcetam  500 mg Per Tube BID   mouth rinse  15 mL Mouth Rinse BID   memantine  10 mg Per Tube QHS   multivitamin  15 mL Per Tube Daily   Netarsudil Dimesylate  1 drop Both Eyes QHS   valproic acid  1,000 mg Per Tube Daily   valproic acid  500 mg Per Tube QHS   zinc oxide   Topical BID   Continuous Infusions:  feeding supplement (JEVITY 1.2 CAL) 1,000 mL (11/27/21 0911)     LOS: 10 days    Time spent: 25 mins.More than 50% of that time was spent in counseling and/or coordination of care.      Shelly Coss, MD [facility] P12/04/2021, 7:55 AM

## 2021-11-29 NOTE — NC FL2 (Signed)
[place] LEVEL OF CARE SCREENING TOOL     IDENTIFICATION  Patient Name: [name] [name] Birthdate: [birth year] Sex: male Admission Date (Current Location): 11/18/2021  [facility] and [place] Number:  Psychologist, counselling and Address:  [facility],  [place], [place]      Provider Number: [id number]  Attending Physician Name and Address:  Shelly Coss, MD  Relative Name and Phone Number:  Malkin,[name] Spouse [id number]  [phone]  Nancy, Tincher   [phone]  Jolly, Thammavongsa [id number]  [phone]  Shaw,Bonnita Daughter   [phone]    Current Level of Care: Hospital Recommended Level of Care: [place] Prior Approval Number:    Date Approved/Denied:   PASRR Number: [id number]  Discharge Plan: SNF    Current Diagnoses: Patient Active Problem List   Diagnosis Date Noted   Acute metabolic encephalopathy 99991111   Acute encephalopathy 11/18/2021   Recurrent pneumonia 10/20/2021   Pressure injury of skin 05/11/2021   Pericardial effusion, acute    COVID    Palliative care by specialist    Goals of care, counseling/discussion    General weakness    Syncope 05/07/2021   Seizure ([place]) 03/09/2021   Dementia ([place]) 02/25/2021   BPH with urinary obstruction 12/22/2020   Ankle edema, bilateral 07/13/2020   Hallucinations 02/09/2020   Abdominal pain, acute 08/21/2014   GI bleeding 05/26/2012   Renal mass 05/26/2012   Diverticulosis of colon with hemorrhage 05/26/2012   SHOULDER PAIN, BILATERAL 07/30/2010   HERPES SIMPLEX INFECTION 10/10/2007   TACHYCARDIA, PAROXYSMAL NOS 10/10/2007   Dyslipidemia 07/11/2007   Essential hypertension 07/11/2007   ALLERGIC RHINITIS 07/11/2007    Orientation RESPIRATION BLADDER Height & Weight     Self  Normal Incontinent Weight: 176 lb 5.9 oz (80 kg) Height:  6\' 1"  (185.4 cm)  BEHAVIORAL SYMPTOMS/MOOD NEUROLOGICAL BOWEL NUTRITION  STATUS      Continent Diet, Feeding tube (Patient is a new peg tube.)  AMBULATORY STATUS COMMUNICATION OF NEEDS Skin   Extensive Assist Verbally PU Stage and Appropriate Care PU Stage 1 Dressing: Daily                     Personal Care Assistance Level of Assistance  Bathing, Feeding, Dressing Bathing Assistance: Maximum assistance Feeding assistance: Maximum assistance Dressing Assistance: Maximum assistance     Functional Limitations Info  Sight, Hearing, Speech Sight Info: Adequate Hearing Info: Adequate Speech Info: Adequate    SPECIAL CARE FACTORS FREQUENCY  PT (By licensed PT), OT (By licensed OT), Speech therapy     PT Frequency: 5 x week OT Frequency: 5 x week     Speech Therapy Frequency: Minimum  2x a week      Contractures Contractures Info: Not present    Additional Factors Info  Code Status, Psychotropic, Allergies, Isolation Precautions Code Status Info: full code Allergies Info: chocolate cipro lipitor Psychotropic Info: risperdal   Isolation Precautions Info: MRSA for contact precautions.     Current Medications (11/29/2021):  This is the current hospital active medication list Current Facility-Administered Medications  Medication Dose Route Frequency Provider Last Rate Last Admin   acetaminophen (TYLENOL) 160 MG/5ML solution 650 mg  650 mg Per Tube Q6H PRN Florencia Reasons, MD   650 mg at 11/29/21 [id number]   Or   acetaminophen (TYLENOL) suppository 650 mg  650 mg Rectal Q6H PRN Florencia Reasons, MD       ascorbic acid (VITAMIN C) tablet 250 mg  250 mg  Per Tube Daily Albertine Grates, MD   250 mg at 11/29/21 0859   brinzolamide (AZOPT) 1 % ophthalmic suspension 1 drop  1 drop Both Eyes TID Eduard Clos, MD   1 drop at 11/29/21 1705   chlorhexidine (PERIDEX) 0.12 % solution 15 mL  15 mL Mouth Rinse BID Albertine Grates, MD   15 mL at 11/29/21 0858   donepezil (ARICEPT) tablet 10 mg  10 mg Per Tube Meribeth Mattes, MD   10 mg at 11/28/21 2120   enoxaparin (LOVENOX) injection 40  mg  40 mg Subcutaneous Q24H Eduard Clos, MD   40 mg at 11/29/21 0900   feeding supplement (JEVITY 1.2 CAL) liquid 1,000 mL  1,000 mL Per Tube Continuous Shon Hough, NP 70 mL/hr at 11/27/21 0911 1,000 mL at 11/27/21 0911   feeding supplement (PROSource TF) liquid 45 mL  45 mL Per Tube Daily Alex Gardener T, NP   45 mL at 11/29/21 0901   free water 150 mL  150 mL Per Tube Q6H Alex Gardener T, NP   150 mL at 11/29/21 1705   guaiFENesin (ROBITUSSIN) 100 MG/5ML liquid 10 mL  10 mL Per Tube Q6H Adhikari, Amrit, MD   10 mL at 11/29/21 1707   latanoprost (XALATAN) 0.005 % ophthalmic solution 1 drop  1 drop Both Eyes QHS Eduard Clos, MD   1 drop at 11/28/21 2121   levETIRAcetam (KEPPRA) 100 MG/ML solution 500 mg  500 mg Per Tube BID Albertine Grates, MD   500 mg at 11/29/21 0902   lip balm (CARMEX) ointment   Topical PRN Albertine Grates, MD   75 application at 11/19/21 2310   MEDLINE mouth rinse  15 mL Mouth Rinse BID Eduard Clos, MD   15 mL at 11/29/21 0904   memantine (NAMENDA) tablet 10 mg  10 mg Per Tube QHS Albertine Grates, MD   10 mg at 11/28/21 2120   multivitamin liquid 15 mL  15 mL Per Tube Daily Albertine Grates, MD   15 mL at 11/29/21 0903   Netarsudil Dimesylate 0.02 % SOLN 1 drop  1 drop Both Eyes QHS Eduard Clos, MD       valproic acid (DEPAKENE) 250 MG/5ML solution 1,000 mg  1,000 mg Per Tube Daily Albertine Grates, MD   1,000 mg at 11/29/21 0902   valproic acid (DEPAKENE) 250 MG/5ML solution 500 mg  500 mg Per Tube QHS Albertine Grates, MD   500 mg at 11/28/21 2120   zinc oxide 20 % ointment   Topical BID Albertine Grates, MD   Given at 11/29/21 0902     Discharge Medications: Please see discharge summary for a list of discharge medications.  Relevant Imaging Results:  Relevant Lab Results:   Additional Information SSN [id number]  Darleene Cleaver, LCSW

## 2021-11-29 NOTE — TOC Progression Note (Addendum)
Transition of Care [facility]) - Progression Note    Patient Details  Name: [name] [name] MRN: [id number] Date of Birth: 30-Oct-1935  [facility] CM/SW Contact  Darleene Cleaver, [place] Phone Number: 11/29/2021, 6:20 PM  Clinical Narrative:     CSW spoke to [facility], patient owes SNF money.  Patient was at [facility], he was initially under short term rehab, and patient is in the process of transitioning to LTC Medicaid.  Per patient's wife, she has submitted all the paperwork to the [facility] worker Melissa Gentle at [facility], [phone] on Friday, and his Medicaid was still pending.  Patient's wife said she does not want patient to return to [place], however [facility] was the only facility that was willing to accept him back but she needs to pay the balance.  Patient's wife asked if patient can go to [facility], CSW informed her that they usually don't have any long term care beds available.  CSW spoke to [place] at [facility], and she confirmed they do not have any long term are beds available.  CSW updated FL2 and faxed patient's information to different SNFs awaiting other bed options.  CSW explained to patient's wife, that insurance will probably not approve patient for short term since he will need LTC.  CSW explained to wife that whichever facility patient ends up going to, she will have to pay out of pocket.  CSW to continue to follow patient's progress throughout discharge planning.   Expected Discharge Plan: Skilled Nursing Facility Barriers to Discharge: Continued Medical Work up  Expected Discharge Plan and Services Expected Discharge Plan: Skilled Nursing Facility                                               Social Determinants of Health (SDOH) Interventions    Readmission Risk Interventions Readmission Risk Prevention Plan 10/22/2021  Transportation Screening Complete  Medication Review (RN Care Manager)  Referral to Pharmacy  PCP or Specialist appointment within 3-5 days of discharge Complete  HRI or Home Care Consult Not Complete  HRI or Home Care Consult Pt Refusal Comments from SNF  SW Recovery Care/Counseling Consult Complete  Palliative Care Screening Not Applicable  Skilled Nursing Facility Complete  Some recent data might be hidden

## 2021-11-30 LAB — RESP PANEL BY RT-PCR (FLU A&B, COVID) ARPGX2
Influenza A by PCR: NEGATIVE
Influenza B by PCR: NEGATIVE
SARS Coronavirus 2 by RT PCR: NEGATIVE

## 2021-11-30 LAB — GLUCOSE, CAPILLARY
Glucose-Capillary: 102 mg/dL — ABNORMAL HIGH (ref 70–99)
Glucose-Capillary: 110 mg/dL — ABNORMAL HIGH (ref 70–99)
Glucose-Capillary: 112 mg/dL — ABNORMAL HIGH (ref 70–99)
Glucose-Capillary: 86 mg/dL (ref 70–99)
Glucose-Capillary: 90 mg/dL (ref 70–99)
Glucose-Capillary: 93 mg/dL (ref 70–99)
Glucose-Capillary: 95 mg/dL (ref 70–99)

## 2021-11-30 MED ORDER — MELATONIN 5 MG PO TABS: 10.0000 mg | ORAL_TABLET | Freq: Every day | ORAL | 0 refills | Status: DC

## 2021-11-30 MED ORDER — OMEGA-3 FATTY ACIDS 1000 MG PO CAPS: 1.0000 g | ORAL_CAPSULE | Freq: Every day | ORAL | Status: DC

## 2021-11-30 MED ORDER — GUAIFENESIN 100 MG/5ML PO LIQD: 15.0000 mL | ORAL | 0 refills | Status: DC | PRN

## 2021-11-30 MED ORDER — ACETAMINOPHEN 500 MG PO TABS: 500.0000 mg | ORAL_TABLET | Freq: Three times a day (TID) | ORAL | 0 refills | Status: DC | PRN

## 2021-11-30 MED ORDER — FREE WATER: 150.0000 mL | Freq: Four times a day (QID) | Status: DC

## 2021-11-30 MED ORDER — MEMANTINE HCL 10 MG PO TABS: 10.0000 mg | ORAL_TABLET | Freq: Every day | ORAL | Status: DC

## 2021-11-30 MED ORDER — JEVITY 1.2 CAL PO LIQD: 1000.0000 mL | ORAL | 0 refills | Status: DC

## 2021-11-30 MED ORDER — DONEPEZIL HCL 10 MG PO TABS: 10.0000 mg | ORAL_TABLET | Freq: Every day | ORAL | Status: DC

## 2021-11-30 MED ORDER — GLUCOSAMINE-CHONDROITIN 500-400 MG PO CAPS: 1.0000 | ORAL_CAPSULE | Freq: Every day | ORAL | 0 refills | Status: DC

## 2021-11-30 MED ORDER — PROSOURCE TF PO LIQD: 45.0000 mL | Freq: Every day | ORAL | Status: DC

## 2021-11-30 MED ORDER — VALPROIC ACID 250 MG/5ML PO SOLN: 1000.0000 mg | Freq: Every day | ORAL | Status: DC

## 2021-11-30 MED ORDER — VITAMIN C 250 MG PO TABS: 250.0000 mg | ORAL_TABLET | Freq: Every day | ORAL | Status: DC

## 2021-11-30 MED ORDER — ZINC OXIDE 20 % EX OINT: TOPICAL_OINTMENT | Freq: Two times a day (BID) | CUTANEOUS | 0 refills | Status: DC

## 2021-11-30 MED ORDER — MULTI-VITAMIN/MINERALS PO TABS: 1.0000 | ORAL_TABLET | Freq: Every day | ORAL | Status: DC

## 2021-11-30 MED ORDER — LEVETIRACETAM 100 MG/ML PO SOLN: 500.0000 mg | Freq: Two times a day (BID) | ORAL | 12 refills | Status: DC

## 2021-11-30 MED ORDER — VALPROIC ACID 250 MG/5ML PO SOLN: 500.0000 mg | Freq: Every day | ORAL | Status: DC

## 2021-11-30 NOTE — Care Management Important Message (Signed)
Important Message  Patient Details IM Letter placed in Patients room. Name: [name] [name] MRN: [id number] Date of Birth: Jun 05, 1935   Medicare Important Message Given:  Yes     [name] 11/30/2021, 11:56 AM

## 2021-11-30 NOTE — Discharge Summary (Signed)
Physician Discharge Summary  [name] [name] [id number] DOB: 10-29-1935 DOA: 11/18/2021  PCP: Raymondo Band, MD  Admit date: 11/18/2021 Discharge date: 11/30/2021  Admitted From: SNF Disposition:  SNF  Discharge Condition:Stable CODE STATUS:FULL Diet recommendation: tube feeding   Brief/Interim Summary:   Patient is a 86 year old male with history of seizure, Advanced dementia ,nursing home resident who was discharged from here  2 weeks ago for COVID-pneumonia, found to have lethargy on 11:24 AM, per nursing staff patient is normally talkative, aaox1, will even sing to the snf RN, but he was noticed to have increased crackles and rhonchi required suctioning .  He was suspected to have recurrent aspiration pneumonia, so sent here.currently on antibiotics for aspiration pneumonia.  Mental status slowly improving but he is severely dysphasic requiring feeding tube.Speech therapy following .  After discussion with family, we decided to go ahead and put PEG tube.  IR performed PEG placement on 11/26/2021. Tube diet started. Patient is medically stable for discharge to skilled nursing facility as soon as bed is available.  Following problems were addressed during his hospitalization :  Acute metabolic encephalopathy, Possibly from recurrent aspiration pneumonia -CT head no acute findings, ammonia level unremarkable, valproic acid level below low therapeutic -Started on IV antibiotics on admission, changed to Augmentin, now he has  finished the course. --Blood culture no growth, urine culture no growth, COVID screening negative -Mental status has improved after risperidone has been discontinued   Left Pleural effusion Monitor, small to moderate as per CT.  Given a dose of  IV Lasix x1, echo showed diastolic dysfunction There are no lower extremity edema Since respiratory status is stable, we will not do thoracentesis at this time    right upper extremity edema venous Doppler negative  for  DVT Elevate right arm   History of seizure On Depakote and Keppra EEG :no seizure    Left hip pain Pelvic x-ray concerning for nondisplaced left femur fracture, recommend CT for further evaluation , CT did not reveal fracture   BPH Oral meds held, cant be given through tube    FTT/Dysphagia/high risk for aspiration Frequent falls in the past when he was at home, he was discharge to snf on 11/11, he is not that ambulatory.  Now has significantly declined.   He has high risks of aspiration in the future.  Underwent PEG tube placement on 11/26/2021.  Plan is to discharge him back to skilled nursing facility    Advanced dementia/from nursing home Continue supportive care. Delirium  precautions.  On risperidone at bedtime,on hold now due to excessive sleepiness.  Plan is to discharge back to SNF.   Pressure ulcers Present on admission Continue wound care as per wound care nurse    Discharge Diagnoses:  Principal Problem:   Acute encephalopathy Active Problems:   Essential hypertension   Dementia ([place])   Seizure ([place])   Pressure injury of skin   Recurrent pneumonia   Acute metabolic encephalopathy    Discharge Instructions  Discharge Instructions     Diet general   Complete by: As directed    Tube feeding   Discharge instructions   Complete by: As directed    1)Do CBC and BMP tests in a week   Discharge wound care:   Complete by: As directed    As per wound care   Increase activity slowly   Complete by: As directed       Allergies as of 11/30/2021       Reactions  Chocolate    Itching if he eats too much chocolate    Ciprofloxacin    Achilles tendon pain    Lipitor [atorvastatin]    Muscle aches         Medication List     STOP taking these medications    aluminum-magnesium hydroxide-simethicone 200-200-20 MG/5ML Susp Commonly known as: MAALOX   CO Q 10 PO   divalproex 500 MG DR tablet Commonly known as: DEPAKOTE   levETIRAcetam 500 MG  tablet Commonly known as: KEPPRA Replaced by: levETIRAcetam 100 MG/ML solution   loperamide 2 MG capsule Commonly known as: IMODIUM   magnesium hydroxide 400 MG/5ML suspension Commonly known as: MILK OF MAGNESIA   MILK THISTLE PO   polyethylene glycol 17 g packet Commonly known as: MIRALAX / GLYCOLAX   promethazine 25 MG tablet Commonly known as: PHENERGAN   risperiDONE 0.5 MG tablet Commonly known as: RISPERDAL   senna 8.6 MG Tabs tablet Commonly known as: SENOKOT   tamsulosin 0.4 MG Caps capsule Commonly known as: FLOMAX       TAKE these medications    acetaminophen 500 MG tablet Commonly known as: TYLENOL Place 1 tablet (500 mg total) into feeding tube every 8 (eight) hours as needed. What changed:  how to take this when to take this reasons to take this   Azopt 1 % ophthalmic suspension Generic drug: brinzolamide Place 1 drop into both eyes 3 (three) times daily.   donepezil 10 MG tablet Commonly known as: ARICEPT Place 1 tablet (10 mg total) into feeding tube at bedtime. What changed: how to take this   feeding supplement (JEVITY 1.2 CAL) Liqd Place 1,000 mLs into feeding tube continuous.   feeding supplement (PROSource TF) liquid Place 45 mLs into feeding tube daily. Start taking on: December 01, 2021   fish oil-omega-3 fatty acids 1000 MG capsule Place 1 capsule (1 g total) into feeding tube daily. What changed: how to take this   free water Soln Place 150 mLs into feeding tube every 6 (six) hours.   Glucosamine-Chondroitin 500-400 MG Caps 1 tablet by PEG Tube route daily. What changed: how to take this   guaiFENesin 100 MG/5ML liquid Commonly known as: ROBITUSSIN Place 15 mLs into feeding tube every 4 (four) hours as needed for cough or to loosen phlegm. What changed: how to take this   latanoprost 0.005 % ophthalmic solution Commonly known as: XALATAN Place 1 drop into both eyes at bedtime.   levETIRAcetam 100 MG/ML  solution Commonly known as: KEPPRA Place 5 mLs (500 mg total) into feeding tube 2 (two) times daily. Replaces: levETIRAcetam 500 MG tablet   melatonin 5 MG Tabs Place 2 tablets (10 mg total) into feeding tube at bedtime. What changed: how to take this   memantine 10 MG tablet Commonly known as: NAMENDA Place 1 tablet (10 mg total) into feeding tube at bedtime. What changed:  how much to take how to take this when to take this additional instructions   multivitamin with minerals tablet Place 1 tablet into feeding tube daily. What changed: how to take this   Rhopressa 0.02 % Soln Generic drug: Netarsudil Dimesylate Place 1 drop into both eyes at bedtime.   valproic acid 250 MG/5ML solution Commonly known as: DEPAKENE Place 10 mLs (500 mg total) into feeding tube at bedtime.   valproic acid 250 MG/5ML solution Commonly known as: DEPAKENE Place 20 mLs (1,000 mg total) into feeding tube daily. Start taking on: December 01, 2021  vitamin C 250 MG tablet Commonly known as: ASCORBIC ACID Place 1 tablet (250 mg total) into feeding tube daily. What changed: how to take this   zinc oxide 20 % ointment Apply topically 2 (two) times daily.               Discharge Care Instructions  (From admission, onward)           Start     Ordered   11/30/21 0000  Discharge wound care:       Comments: As per wound care   11/30/21 1439            Allergies  Allergen Reactions   Chocolate     Itching if he eats too much chocolate    Ciprofloxacin     Achilles tendon pain    Lipitor [Atorvastatin]     Muscle aches     Consultations: IR   Procedures/Studies: CT ABDOMEN PELVIS WO CONTRAST  Result Date: 11/23/2021 CLINICAL DATA:  Preoperative evaluation for gastric tube placement. EXAM: CT ABDOMEN AND PELVIS WITHOUT CONTRAST TECHNIQUE: Multidetector CT imaging of the abdomen and pelvis was performed following the standard protocol without IV contrast. COMPARISON:   08/20/2014.  05/25/2012. FINDINGS: Lower chest: Bilateral pleural effusions larger on the left than the right with dependent atelectasis and or infiltrate, worse in the left lower lobe than the right lower lobe. Cardiomegaly. Small amount of pericardial fluid. Hepatobiliary: Liver appears normal without contrast. No calcified gallstones. Pancreas: Normal Spleen: Normal Adrenals/Urinary Tract: Adrenal glands are normal. Left kidney is normal. Right kidney shows a 17 mm calcified mass in the ventral upper pole. This is not enlarged and is actually slightly smaller than was seen in 2013 and 2015. This was felt previously to represent a benign calcified hemorrhagic or proteinaceous cyst, and the follow-up would be consistent with that diagnosis. No progressive or worrisome finding. Bladder is thick-walled. Stomach/Bowel: Nasogastric tube enters the stomach. Stomach appears normally positioned. There is no interposed colon presently between the body and antrum of the stomach and the anterior abdominal wall. The inferior aspect of the left lobe does overlie the lesser curvature region. No small bowel pathology is seen. Question the presence of a rectal mass. Digital examination recommended. Vascular/Lymphatic: Aortic atherosclerosis. No aneurysm. IVC is normal. No adenopathy. Reproductive: Normal except for enlarged prostate. Other: No free fluid or air. Small left inguinal hernia containing only fat. Musculoskeletal: This chronic degenerative changes affect the spine. IMPRESSION: Bilateral pleural effusions, larger on the left than the right, with atelectasis in both lower lobes, left worse than right. Cardiomegaly.  Pericardial effusion. No unexpected gastric anatomic findings. No interposed colon between the anterior wall of the body and antrum of the stomach and the anterior abdominal wall. Chronic 1.7 cm calcified region at the upper pole the right kidney, actually smaller than seen 79 years ago, consistent with a  benign entity. See above discussion. Enlarged prostate. Question rectal mass versus proctitis. Thick-walled bladder suggesting cystitis or chronic outlet obstruction. Small left inguinal hernia containing only fat. Electronically Signed   By: Nelson Chimes M.D.   On: 11/23/2021 19:08   DG Pelvis 1-2 Views  Result Date: 11/19/2021 CLINICAL DATA:  Unresponsiveness. EXAM: PELVIS - 1-2 VIEW COMPARISON:  None. FINDINGS: Evaluation is limited on the provided images. Faint linear subcapital lucency through the neck of the left femur concerning for a nondisplaced fracture. Dedicated left hip radiograph or CT is recommended for better evaluation. No dislocation. The bones are osteopenic. Degenerative changes  of the lumbar spine. The soft tissues are unremarkable. IMPRESSION: Findings concerning for a nondisplaced left femoral neck fracture. Dedicated left hip radiograph or CT is recommended for better evaluation. Electronically Signed   By: Anner Crete M.D.   On: 11/19/2021 03:41   DG Abd 1 View  Result Date: 11/21/2021 CLINICAL DATA:  NG placement. EXAM: ABDOMEN - 1 VIEW COMPARISON:  Earlier radiograph dated 11/19/2021. FINDINGS: Feeding tube with weighted tip in the upper abdomen likely in the proximal stomach. Small left and possibly trace right pleural effusion. There is bibasilar atelectasis or infiltrate. Bilateral hazy and interstitial densities may represent edema. Pneumonia is not excluded. There is cardiomegaly. No acute osseous pathology. IMPRESSION: Feeding tube with weighted tip in the proximal stomach. Electronically Signed   By: Anner Crete M.D.   On: 11/21/2021 03:57   CT HEAD WO CONTRAST (5MM)  Result Date: 11/18/2021 CLINICAL DATA:  Delirium. EXAM: CT HEAD WITHOUT CONTRAST TECHNIQUE: Contiguous axial images were obtained from the base of the skull through the vertex without intravenous contrast. COMPARISON:  10/25/2021. FINDINGS: Brain: No acute intracranial hemorrhage, midline shift  or mass effect. Generalized atrophy is noted. Subcortical and periventricular white matter hypodensities are seen bilaterally. There is no hydrocephalus. Vascular: No hyperdense vessel or unexpected calcification. Skull: Normal. Negative for fracture or focal lesion. Sinuses/Orbits: Round density is present in the left maxillary sinus, possible mucosal retention cyst or polyp. There is partial opacification of the ethmoid air cells bilaterally. The orbits are stable. Other: None. IMPRESSION: Stable head CT with no acute intracranial process. Electronically Signed   By: Brett Fairy M.D.   On: 11/18/2021 20:48   CT Chest Wo Contrast  Result Date: 11/18/2021 CLINICAL DATA:  Chest pain, shortness of breath. Altered mental status. Recent pneumonia. Recent COVID positive. EXAM: CT CHEST WITHOUT CONTRAST TECHNIQUE: Multidetector CT imaging of the chest was performed following the standard protocol without IV contrast. COMPARISON:  None. FINDINGS: Cardiovascular: Mild cardiomegaly. Small pericardial effusion is present. Calcified plaque over the left anterior descending and lateral circumflex coronary arteries. No evidence of thoracic aortic aneurysm. Calcified plaque is present over the thoracic aorta. Remaining vascular structures are unremarkable. Mediastinum/Nodes: No definite mediastinal or hilar adenopathy. Remaining mediastinal structures are unremarkable. Lungs/Pleura: Lungs are adequately inflated and demonstrate patchy hazy opacification over the mid to lower lungs bilaterally. Consolidation over the left base likely compressive atelectasis with small to moderate left effusion. Airways are normal. Upper Abdomen: Minimal calcified plaque over the abdominal aorta. Oval partially calcified rounded mass over the upper pole right kidney slightly smaller compared to 2015. No acute findings. Musculoskeletal: Degenerative change of the spine. No focal abnormality. IMPRESSION: 1. Patchy bilateral hazy airspace  process which may be due to infection bacterial or viral origin. Small to moderate left pleural effusion with associated left basilar consolidation likely compressive atelectasis. 2. Mild cardiomegaly with small pericardial effusion. Atherosclerotic coronary artery disease. 3. Aortic atherosclerosis. 4. Oval partially calcified rounded mass over the upper pole right kidney slightly smaller compared to 2015. Aortic Atherosclerosis (ICD10-I70.0). Electronically Signed   By: Marin Olp M.D.   On: 11/18/2021 20:52   CT PELVIS WO CONTRAST  Result Date: 11/19/2021 CLINICAL DATA:  Left femur fracture. EXAM: CT PELVIS WITHOUT CONTRAST TECHNIQUE: Multidetector CT imaging of the pelvis was performed following the standard protocol without intravenous contrast. COMPARISON:  Radiograph of same day. FINDINGS: Urinary Tract:  No abnormality visualized. Bowel: Rectal wall thickening is noted with surrounding inflammatory changes concerning for proctitis. Vascular/Lymphatic: Atherosclerosis  of visualized portion of abdominal aorta is noted. No adenopathy is noted. Reproductive:  Mild prostatic enlargement is noted. Other:  No definite ascites or hernia is noted. Musculoskeletal: No definite fracture is noted. IMPRESSION: No definite fracture or other significant bony abnormality is noted. Rectal wall thickening is noted with surrounding inflammatory changes concerning for proctitis. Aortic Atherosclerosis (ICD10-I70.0). Electronically Signed   By: Marijo Conception M.D.   On: 11/19/2021 13:32   IR GASTROSTOMY TUBE MOD SED  Result Date: 11/26/2021 INDICATION: 86 year old gentleman with dementia, pneumonia, dysphagia requires feeding tube access. EXAM: Percutaneous gastrostomy tube placement MEDICATIONS: Ancef 2 g IV; Antibiotics were administered within 1 hour of the procedure. Glucagon 1 mg IV ANESTHESIA/SEDATION: Moderate (conscious) sedation was employed during this procedure. A total of Versed 1 mg and Fentanyl 50 mcg  was administered intravenously by the radiology nurse. Total intra-service moderate Sedation Time: 16 minutes. The patient's level of consciousness and vital signs were monitored continuously by radiology nursing throughout the procedure under my direct supervision. CONTRAST:  15 mL of Omnipaque 300-administered into the gastric lumen. FLUOROSCOPY TIME:  Fluoroscopy Time: 1 minutes 42 seconds (8 mGy). COMPLICATIONS: None immediate. PROCEDURE: Informed written consent was obtained from the patient after a thorough discussion of the procedural risks, benefits and alternatives. All questions were addressed. Maximal Sterile Barrier Technique was utilized including caps, mask, sterile gowns, sterile gloves, sterile drape, hand hygiene and skin antiseptic. A timeout was performed prior to the initiation of the procedure. The left hepatic lobe margin was marked utilizing ultrasound guidance. The epigastric region was prepped and draped in the usual sterile fashion. The stomach was insufflated utilizing the NG tube. Following local lidocaine administration, 2 gastropexies were placed to secure the anterior wall of the stomach to the anterior abdominal wall. Percutaneous access obtained into the gastric antrum at the center of the gastropexies with an 18 gauge needle. Guide wire advanced into the gastric lumen. Serial dilation was performed and peel-away sheath was placed. 81 French gastrostomy tube inserted over the guidewire into the gastric lumen. The G tube retention balloon was inflated with 10 mL of dilute contrast and retracted to the anterior gastric wall. Contrast administrated through the gastrostomy tube opacified the gastric lumen. The insertion site was covered with sterile dressing. IMPRESSION: 62 French gastrostomy tube placement as above. Electronically Signed   By: Miachel Roux M.D.   On: 11/26/2021 16:12   CT FEMUR LEFT WO CONTRAST  Result Date: 11/19/2021 CLINICAL DATA:  Suspected femoral fracture  EXAM: CT OF THE LOWER LEFT EXTREMITY WITHOUT CONTRAST TECHNIQUE: Multidetector CT imaging of the lower left extremity was performed according to the standard protocol. COMPARISON:  Pelvic x-ray earlier the same day. FINDINGS: Bones/Joint/Cartilage No acute fracture identified, specifically in the subcapital region of the femur which was suspected on plain film. Mild degenerative changes of the hip and moderate degenerative changes of the knee noted. Chondrocalcinosis noted in the knee medial and lateral compartments. Ligaments Suboptimally assessed by CT. Muscles and Tendons Calcific densities in the proximal hamstring tendons suggestive of chronic tendinosis. No obvious significant muscle abnormality identified. Soft tissues Mild subcutaneous fat stranding edema in the thigh. Arterial vascular calcifications noted. IMPRESSION: No acute fracture or dislocation identified. Other chronic findings as described. Electronically Signed   By: Ofilia Neas M.D.   On: 11/19/2021 13:39   DG Chest Port 1 View  Result Date: 11/18/2021 CLINICAL DATA:  Possible sepsis. Altered mental status and shortness of breath. EXAM: PORTABLE CHEST 1 VIEW COMPARISON:  10/25/2021 FINDINGS: Lungs are somewhat hypoinflated with continued hazy opacification over the lung bases left worse than right. Left effusion and atelectasis is likely. Infection in the lung bases is possible. Cardiomediastinal silhouette and remainder of the exam is unchanged. IMPRESSION: Stable bibasilar opacification left worse than right likely effusions with atelectasis. Infection in the lung bases is possible. Electronically Signed   By: Marin Olp M.D.   On: 11/18/2021 19:38   DG Abd Portable 1V  Result Date: 11/19/2021 CLINICAL DATA:  NG-tube. EXAM: PORTABLE ABDOMEN - 1 VIEW COMPARISON:  Abdominal x-ray 10/25/2021. FINDINGS: Enteric tube tip is at the level of the gastric antrum. Bowel-gas pattern is nonobstructive. There are small densities in the  right upper quadrant, possibly related to bowel content. No acute fractures are seen. IMPRESSION: Enteric tube tip is at the level of the gastric antrum. Electronically Signed   By: Ronney Asters M.D.   On: 11/19/2021 19:43   DG Swallowing Func-Speech Pathology  Result Date: 11/22/2021 Table formatting from the original result was not included. Objective Swallowing Evaluation: Type of Study: MBS-Modified Barium Swallow Study  Patient Details Name: [name] [name] MRN: [id number] Date of Birth: [birth year] Today's Date: 11/22/2021 Time: SLP Start Time (ACUTE ONLY): 1345 -SLP Stop Time (ACUTE ONLY): [id number] SLP Time Calculation (min) (ACUTE ONLY): 20 min Past Medical History: Past Medical History: Diagnosis Date  Allergy   Dementia ([place])   sees Dr. Ellouise Newer   Diverticulosis of colon (without mention of hemorrhage) 03-01-2004, 04-04-2011  Colonoscopy  ED (erectile dysfunction)   Glaucoma   sees Dr. Crecencio Mc   Hyperglycemia   Hyperlipidemia   Hypertension   Internal hemorrhoids 03-15-1999  Flex Sig   Irritable bowel  Past Surgical History: Past Surgical History: Procedure Laterality Date  COLONOSCOPY  04-04-11  per Dr. Sharlett Iles, clear, no repeats needed  EYE SURGERY    bilateral cataract extraction per Dr Dolores Lory  INGUINAL HERNIA REPAIR    KNEE ARTHROSCOPY    right knee  NASAL SINUS SURGERY   HPI: JERIME GONYA is a 86 y.o. male with known history of dementia, seizures, BPH, decubitus ulcers, pericardial effusion anemia was recently admitted for pneumonia discharged about 2 weeks ago was brought to the ER after patient was found to be increasingly confused and lethargic. CT chest was showing bilateral infiltrates concerning for pneumonia also was showing mild pericardial effusion. Most recent MBS 10/28/21 recommending Dys 2/honey thick and pt was able to upgrade to Dys 2/nectar prior to discharge.  Subjective: lethargic but able to particiate in minimal amount of PO intake and follow basic directions  Recommendations  for follow up therapy are one component of a multi-disciplinary discharge planning process, led by the attending physician.  Recommendations may be updated based on patient status, additional functional criteria and insurance authorization. Assessment / Plan / Recommendation Clinical Impressions 11/22/2021 Clinical Impression Patient presents with a mild oral and a severe pharyngeal phase dysphagia which is significantly declined as compared to MBS documented from 3 weeks ago. NG feeding tube in place during today's study and was in place during prior MBS as well. During today's MBS, patient was lethargic and required cues to maintain alertness. He was able to follow basic directions to swallow again, cough. First PO administered was puree texture and majority of this bolus become retained in vallecular and pyriform sinus. He exhibited one instance of sensed aspiration (PAS 7) of moderate amount of puree which occured after the swallow and was from pyriform sinus residuals. Several  other instances of aspiration occured, all of which were during or after swallows and all were silent. (PAS 8). SLP then gave patient spoon sip of nectar thick liquids, followed by spoon sip of thin liquid barium, both of which did not aid in reducing vallecular and sinus residuals. Very small amount of barium contrast would transit through UES with each swallow. Patient was able to cough and transit penetrate, aspirate out of laryngeal vestibule, but not consistently. There was a mild reduction in amount of vallecular and pyriform sinus residuals with multiple cued cough/throat clear and reswallows, dry swallows, however unable to achieve full clearance of pharyngeal residuals by end of study. SLP is recommending continue with NPO status and prognosis of PO advancement is guarded at this time. SLP Visit Diagnosis Dysphagia, oropharyngeal phase (R13.12) Attention and concentration deficit following -- Frontal lobe and executive function  deficit following -- Impact on safety and function Severe aspiration risk;Risk for inadequate nutrition/hydration   Treatment Recommendations 11/22/2021 Treatment Recommendations Therapy as outlined in treatment plan below   Prognosis 11/22/2021 Prognosis for Safe Diet Advancement Guarded Barriers to Reach Goals Severity of deficits;Cognitive deficits Barriers/Prognosis Comment -- Diet Recommendations 11/22/2021 SLP Diet Recommendations NPO Liquid Administration via -- Medication Administration Via alternative means Compensations -- Postural Changes --   Other Recommendations 11/22/2021 Recommended Consults -- Oral Care Recommendations Oral care QID;Staff/trained caregiver to provide oral care Other Recommendations -- Follow Up Recommendations Skilled nursing-short term rehab (<3 hours/day) Assistance recommended at discharge Frequent or constant Supervision/Assistance Functional Status Assessment Patient has had a recent decline in their functional status and/or demonstrates limited ability to make significant improvements in function in a reasonable and predictable amount of time Frequency and Duration  11/22/2021 Speech Therapy Frequency (ACUTE ONLY) min 1 x/week Treatment Duration 1 week   Oral Phase 11/22/2021 Oral Phase Impaired Oral - Pudding Teaspoon -- Oral - Pudding Cup -- Oral - Honey Teaspoon -- Oral - Honey Cup NT Oral - Nectar Teaspoon Delayed oral transit;Reduced posterior propulsion Oral - Nectar Cup NT Oral - Nectar Straw NT Oral - Thin Teaspoon Delayed oral transit;Reduced posterior propulsion Oral - Thin Cup NT Oral - Thin Straw NT Oral - Puree Delayed oral transit Oral - Mech Soft -- Oral - Regular NT Oral - Multi-Consistency -- Oral - Pill NT Oral Phase - Comment --  Pharyngeal Phase 11/22/2021 Pharyngeal Phase Impaired Pharyngeal- Pudding Teaspoon -- Pharyngeal -- Pharyngeal- Pudding Cup -- Pharyngeal -- Pharyngeal- Honey Teaspoon NT Pharyngeal -- Pharyngeal- Honey Cup NT Pharyngeal --  Pharyngeal- Nectar Teaspoon Reduced anterior laryngeal mobility;Reduced airway/laryngeal closure;Penetration/Apiration after swallow;Moderate aspiration;Pharyngeal residue - valleculae;Pharyngeal residue - pyriform Pharyngeal -- Pharyngeal- Nectar Cup NT Pharyngeal -- Pharyngeal- Nectar Straw -- Pharyngeal -- Pharyngeal- Thin Teaspoon Delayed swallow initiation-vallecula;Reduced anterior laryngeal mobility;Reduced airway/laryngeal closure;Moderate aspiration;Penetration/Apiration after swallow;Penetration/Aspiration during swallow;Pharyngeal residue - valleculae;Pharyngeal residue - pyriform Pharyngeal -- Pharyngeal- Thin Cup NT Pharyngeal -- Pharyngeal- Thin Straw NT Pharyngeal -- Pharyngeal- Puree Delayed swallow initiation-vallecula;Penetration/Apiration after swallow;Moderate aspiration;Pharyngeal residue - pyriform;Pharyngeal residue - valleculae;Reduced anterior laryngeal mobility;Reduced airway/laryngeal closure Pharyngeal -- Pharyngeal- Mechanical Soft -- Pharyngeal -- Pharyngeal- Regular NT Pharyngeal -- Pharyngeal- Multi-consistency -- Pharyngeal -- Pharyngeal- Pill NT Pharyngeal -- Pharyngeal Comment --  Cervical Esophageal Phase  11/22/2021 Cervical Esophageal Phase Impaired Pudding Teaspoon -- Pudding Cup -- Honey Teaspoon -- Honey Cup -- Nectar Teaspoon Reduced cricopharyngeal relaxation Nectar Cup -- Nectar Straw -- Thin Teaspoon Reduced cricopharyngeal relaxation Thin Cup Reduced cricopharyngeal relaxation Thin Straw -- Puree Reduced cricopharyngeal relaxation Mechanical Soft -- Regular --  Multi-consistency -- Pill -- Cervical Esophageal Comment -- Sonia Baller, MA, CCC-SLP Speech Therapy                     EEG adult  Result Date: 11/19/2021 Lora Havens, MD     11/19/2021 12:57 PM Patient Name: [name] [name] MRN: [id number] Epilepsy Attending: Lora Havens Referring Physician/Provider: Dr Florencia Reasons Date: 11/19/2021 Duration: 22.41 mins Patient history: 86 year old male with altered  mental status.  EEG to evaluate for seizure. Level of alertness: Awake, asleep AEDs during EEG study: LEV Technical aspects: This EEG study was done with scalp electrodes positioned according to the 10-20 International system of electrode placement. Electrical activity was acquired at a sampling rate of 500Hz  and reviewed with a high frequency filter of 70Hz  and a low frequency filter of 1Hz . EEG data were recorded continuously and digitally stored. Description: During awake state, no clear posterior dominant rhythm was seen.  Sleep was characterized by sleep spindles (12 to 14 Hz), maximal frontocentral region.  EEG showed continuous generalized predominantly 5 to 6 Hz theta as well as intermittent generalized 2 to 3 Hz delta slowing. Hyperventilation and photic stimulation were not performed.   ABNORMALITY - Continuous slow, generalized IMPRESSION: This study is suggestive of moderate diffuse encephalopathy, nonspecific etiology. No seizures or epileptiform discharges were seen throughout the recording. Lora Havens   ECHOCARDIOGRAM COMPLETE  Result Date: 11/19/2021    ECHOCARDIOGRAM REPORT   Patient Name:   [name] [name] Date of Exam: 11/19/2021 Medical Rec #:  [id number]        Height:       73.0 in Accession #:    [id number]       Weight:       166.0 lb Date of Birth:  12-Apr-1935        BSA:          1.988 m Patient Age:    73 years         BP:           130/69 mmHg Patient Gender: M                HR:           67 bpm. Exam Location:  Inpatient Procedure: 2D Echo, Cardiac Doppler and Color Doppler Indications:    Pericardial effusion I31.3  History:        Patient has prior history of Echocardiogram examinations, most                 recent 02/09/2021. Arrythmias:RBBB, Signs/Symptoms:Dementia; Risk                 Factors:Hypertension and Dyslipidemia. Recurrent pneumonia.                 History of pericardial effusion.  Sonographer:    Darlina Sicilian RDCS Referring Phys: [place]  1. Left ventricular ejection fraction, by estimation, is 60 to 65%. The left ventricle has normal function. The left ventricle has no regional wall motion abnormalities. Left ventricular diastolic parameters are consistent with Grade II diastolic dysfunction (pseudonormalization). Elevated left ventricular end-diastolic pressure.  2. Right ventricular systolic function is normal. The right ventricular size is normal. There is normal pulmonary artery systolic pressure.  3. The pericardial effusion is posterior to the left ventricle and lateral to the left ventricle.  4. The mitral valve is abnormal. Trivial mitral valve regurgitation. No evidence of mitral  stenosis.  5. The aortic valve is normal in structure. There is mild calcification of the aortic valve. Aortic valve regurgitation is mild to moderate. Aortic valve sclerosis/calcification is present, without any evidence of aortic stenosis.  6. Aortic dilatation noted. There is mild dilatation of the aortic root, measuring 39 mm. There is mild dilatation of the ascending aorta, measuring 41 mm.  7. The inferior vena cava is normal in size with greater than 50% respiratory variability, suggesting right atrial pressure of 3 mmHg. FINDINGS  Left Ventricle: Left ventricular ejection fraction, by estimation, is 60 to 65%. The left ventricle has normal function. The left ventricle has no regional wall motion abnormalities. The left ventricular internal cavity size was normal in size. There is  no left ventricular hypertrophy. Left ventricular diastolic parameters are consistent with Grade II diastolic dysfunction (pseudonormalization). Elevated left ventricular end-diastolic pressure. Right Ventricle: The right ventricular size is normal. No increase in right ventricular wall thickness. Right ventricular systolic function is normal. There is normal pulmonary artery systolic pressure. The tricuspid regurgitant velocity is 2.28 m/s, and  with an assumed right  atrial pressure of 3 mmHg, the estimated right ventricular systolic pressure is [id number]. Left Atrium: Left atrial size was normal in size. Right Atrium: Right atrial size was normal in size. Pericardium: Trivial pericardial effusion is present. The pericardial effusion is posterior to the left ventricle and lateral to the left ventricle. Mitral Valve: The mitral valve is abnormal. There is mild thickening of the mitral valve leaflet(s). Trivial mitral valve regurgitation. No evidence of mitral valve stenosis. Tricuspid Valve: The tricuspid valve is normal in structure. Tricuspid valve regurgitation is mild . No evidence of tricuspid stenosis. Aortic Valve: The aortic valve is normal in structure. There is mild calcification of the aortic valve. Aortic valve regurgitation is mild to moderate. Aortic regurgitation PHT measures 690 msec. Aortic valve sclerosis/calcification is present, without any evidence of aortic stenosis. Pulmonic Valve: The pulmonic valve was normal in structure. Pulmonic valve regurgitation is mild. No evidence of pulmonic stenosis. Aorta: The aortic root is normal in size and structure and aortic dilatation noted. There is mild dilatation of the aortic root, measuring 39 mm. There is mild dilatation of the ascending aorta, measuring 41 mm. Venous: The inferior vena cava is normal in size with greater than 50% respiratory variability, suggesting right atrial pressure of 3 mmHg. IAS/Shunts: No atrial level shunt detected by color flow Doppler.  LEFT VENTRICLE PLAX 2D LVIDd:         5.40 cm   Diastology LVIDs:         3.10 cm   LV e' medial:    3.73 cm/s LV PW:         0.80 cm   LV E/e' medial:  18.3 LV IVS:        1.10 cm   LV e' lateral:   3.89 cm/s LVOT diam:     2.30 cm   LV E/e' lateral: 17.5 LV SV:         64 LV SV Index:   32 LVOT Area:     4.15 cm                           3D Volume EF:                          3D EF:        63 %  LV EDV:       130 ml                           LV ESV:       49 ml                          LV SV:        81 ml RIGHT VENTRICLE RV S prime:     12.90 cm/s TAPSE (M-mode): 2.0 cm LEFT ATRIUM             Index        RIGHT ATRIUM           Index LA diam:        2.70 cm 1.36 cm/m   RA Area:     10.30 cm LA Vol (A2C):   33.0 ml 16.60 ml/m  RA Volume:   17.80 ml  8.95 ml/m LA Vol (A4C):   26.7 ml 13.43 ml/m LA Biplane Vol: 30.5 ml 15.34 ml/m  AORTIC VALVE LVOT Vmax:   82.90 cm/s LVOT Vmean:  58.800 cm/s LVOT VTI:    0.155 m AI PHT:      690 msec  AORTA Ao Root diam: 3.90 cm Ao STJ diam:  3.1 cm Ao Asc diam:  4.05 cm MITRAL VALVE               TRICUSPID VALVE MV Area (PHT): 2.91 cm    TR Peak grad:   20.8 mmHg MV Decel Time: 261 msec    TR Vmax:        228.00 cm/s MV E velocity: 68.10 cm/s MV A velocity: 74.10 cm/s  SHUNTS MV E/A ratio:  0.92        Systemic VTI:  0.16 m                            Systemic Diam: 2.30 cm Jenkins Rouge MD Electronically signed by Jenkins Rouge MD Signature Date/Time: 11/19/2021/11:10:26 AM    Final    VAS [place] UPPER EXTREMITY VENOUS DUPLEX  Result Date: 11/22/2021 UPPER VENOUS STUDY  Patient Name:  [name] [name]  Date of Exam:   11/21/2021 Medical Rec #: [id number]         Accession #:    [id number] Date of Birth: July 30, 1935         Patient Gender: M Patient Age:   89 years Exam Location:  [facility] Procedure:      VAS [place] UPPER EXTREMITY VENOUS DUPLEX Referring Phys: Annamaria Boots XU --------------------------------------------------------------------------------  Indications: Swelling Limitations: Patient immobilty. Comparison Study: No previous exams Performing Technologist: Jody Hill RVT, RDMS  Examination Guidelines: A complete evaluation includes B-mode imaging, spectral Doppler, color Doppler, and power Doppler as needed of all accessible portions of each vessel. Bilateral testing is considered an integral part of a complete examination. Limited examinations for reoccurring indications may be performed as  noted.  Right Findings: +----------+------------+---------+-----------+----------+-------+ RIGHT     CompressiblePhasicitySpontaneousPropertiesSummary +----------+------------+---------+-----------+----------+-------+ IJV           Full       Yes       Yes                      +----------+------------+---------+-----------+----------+-------+ Subclavian    Full       Yes  Yes                      +----------+------------+---------+-----------+----------+-------+ Axillary      Full       Yes       Yes                      +----------+------------+---------+-----------+----------+-------+ Brachial      Full                                          +----------+------------+---------+-----------+----------+-------+ Radial        Full                                          +----------+------------+---------+-----------+----------+-------+ Ulnar         Full                                          +----------+------------+---------+-----------+----------+-------+ Cephalic      Full                                          +----------+------------+---------+-----------+----------+-------+ Basilic       Full       Yes       Yes                      +----------+------------+---------+-----------+----------+-------+  Left Findings: +----------+------------+---------+-----------+----------+-------+ LEFT      CompressiblePhasicitySpontaneousPropertiesSummary +----------+------------+---------+-----------+----------+-------+ Subclavian    Full       Yes       Yes                      +----------+------------+---------+-----------+----------+-------+  Summary:  Right: No evidence of deep vein thrombosis in the upper extremity. No evidence of superficial vein thrombosis in the upper extremity.  Left: No evidence of thrombosis in the subclavian.  *See table(s) above for measurements and observations.  Diagnosing physician: Orlie Pollen  Electronically signed by Orlie Pollen on 11/22/2021 at 10:23:16 AM.    Final       Subjective:  Patient seen and examined at the bedside this morning.  Hemodynamically stable for discharge to skilled nursing facility.  I discussed with wife on phone about the discharge planning  Discharge Exam: Vitals:   11/30/21 0506 11/30/21 1251  BP: (!) 164/79 139/85  Pulse: 69 62  Resp: 14 18  Temp: 97.8 F (36.6 C) 97.7 F (36.5 C)  SpO2: 100% 100%   Vitals:   11/29/21 2030 11/30/21 0500 11/30/21 0506 11/30/21 1251  BP: (!) 155/75  (!) 164/79 139/85  Pulse: 60  69 62  Resp: 16  14 18   Temp: 98.7 F (37.1 C)  97.8 F (36.6 C) 97.7 F (36.5 C)  TempSrc: Oral  Oral Oral  SpO2: 99%  100% 100%  Weight:  80.2 kg    Height:        General: Pt is alert, awake, confused, very deconditioned, chronically ill looking Cardiovascular: RRR, S1/S2 +, no rubs, no gallops Respiratory: CTA bilaterally, no wheezing, no  rhonchi Abdominal: Soft, NT, ND, bowel sounds +, PEG Extremities: no edema, no cyanosis Skin: Pressure ulcers    The results of significant diagnostics from this hospitalization (including imaging, microbiology, ancillary and laboratory) are listed below for reference.     Microbiology: No results found for this or any previous visit (from the past 240 hour(s)).   Labs: BNP (last 3 results) Recent Labs    05/09/21 0232 11/22/21 0401  BNP 114.1* 126.6*   Basic Metabolic Panel: Recent Labs  Lab 11/25/21 0335 11/26/21 0404 11/28/21 0348  NA  --  137 138  K  --  4.4 4.3  CL  --  104 104  CO2  --  28 30  GLUCOSE  --  103* 107*  BUN  --  20 22  CREATININE 0.69 0.60* 0.45*  CALCIUM  --  8.6* 8.3*   Liver Function Tests: No results for input(s): AST, ALT, ALKPHOS, BILITOT, PROT, ALBUMIN in the last 168 hours. No results for input(s): LIPASE, AMYLASE in the last 168 hours. No results for input(s): AMMONIA in the last 168 hours. CBC: Recent Labs  Lab  11/26/21 0404 11/28/21 0348  WBC 9.0 5.2  NEUTROABS 6.2 3.0  HGB 11.1* 10.0*  HCT 35.9* 33.0*  MCV 86.9 87.1  PLT 190 247   Cardiac Enzymes: No results for input(s): CKTOTAL, CKMB, CKMBINDEX, TROPONINI in the last 168 hours. BNP: Invalid input(s): POCBNP CBG: Recent Labs  Lab 11/29/21 2057 11/30/21 0019 11/30/21 0503 11/30/21 0757 11/30/21 1146  GLUCAP 89 95 112* 93 90   D-Dimer No results for input(s): DDIMER in the last 72 hours. Hgb A1c No results for input(s): HGBA1C in the last 72 hours. Lipid Profile No results for input(s): CHOL, HDL, LDLCALC, TRIG, CHOLHDL, LDLDIRECT in the last 72 hours. Thyroid function studies No results for input(s): TSH, T4TOTAL, T3FREE, THYROIDAB in the last 72 hours.  Invalid input(s): FREET3 Anemia work up No results for input(s): VITAMINB12, FOLATE, FERRITIN, TIBC, IRON, RETICCTPCT in the last 72 hours. Urinalysis    Component Value Date/Time   COLORURINE YELLOW 11/18/2021 1904   APPEARANCEUR CLEAR 11/18/2021 1904   LABSPEC 1.026 11/18/2021 1904   PHURINE 6.0 11/18/2021 1904   GLUCOSEU NEGATIVE 11/18/2021 1904   GLUCOSEU NEGATIVE 02/06/2020 1032   HGBUR NEGATIVE 11/18/2021 1904   HGBUR negative 09/16/2010 0839   BILIRUBINUR NEGATIVE 11/18/2021 1904   BILIRUBINUR neg 02/22/2019 0958   KETONESUR NEGATIVE 11/18/2021 1904   PROTEINUR NEGATIVE 11/18/2021 1904   UROBILINOGEN 0.2 02/06/2020 1032   NITRITE NEGATIVE 11/18/2021 1904   LEUKOCYTESUR TRACE (A) 11/18/2021 1904   Sepsis Labs Invalid input(s): PROCALCITONIN,  WBC,  LACTICIDVEN Microbiology No results found for this or any previous visit (from the past 240 hour(s)).  Please note: You were cared for by a hospitalist during your hospital stay. Once you are discharged, your primary care physician will handle any further medical issues. Please note that NO REFILLS for any discharge medications will be authorized once you are discharged, as it is imperative that you return to  your primary care physician (or establish a relationship with a primary care physician if you do not have one) for your post hospital discharge needs so that they can reassess your need for medications and monitor your lab values.    Time coordinating discharge: 40 minutes  SIGNED:   [name], MD  [facility] 11/30/2021, 2:39 PM Pager [phone]  If 7PM-7AM, please contact night-coverage [email or web address] Password [email or web address]

## 2021-11-30 NOTE — Progress Notes (Addendum)
Nutrition Follow-up  DOCUMENTATION CODES:   Not applicable  INTERVENTION:  - continue evity 1.2 @ 70 ml/hr with 45 ml Prosource TF once/day and 150 ml free water QID.   NUTRITION DIAGNOSIS:   Inadequate oral intake related to inability to eat as evidenced by NPO status. -ongoing  GOAL:   Patient will meet greater than or equal to 90% of their needs -met with TF regimen  MONITOR:   TF tolerance, Diet advancement, Labs, Weight trends, Skin   ASSESSMENT:   86 y.o. male with known history of dementia, seizures, BPH and decubitus ulcers and pericardial effusion anemia was recently admitted for pneumonia discharged about 2 weeks ago was brought to the ER after patient was found to be increasingly confused and lethargic since this morning.  No further history is available.  We will try to reach family to get further history.  Patient remains NPO. PEG placed on 11/26/21. He is receiving TF at goal regimen: Jevity 1.2 @ 70 ml/hr with 45 ml Prosource TF once/day and 150 ml free water QID. This regimen is providing 2056 kcal, 104 grams protein, and 1956 ml free water.   Patient laying in bed with eyes closed and no visitors at bedside at the time of RD visit.   Palliative Care last saw patient/talked with family on 12/3. Patient remains Full Code.    Weight has been stable throughout hospitalization. Non-pitting edema to BUE documented in the edema section of flow sheet.    Labs reviewed; CBGs: 95, 112, 93, 90 mg/dl. No BMP since 12/4.  Medications reviewed; 15 ml liquid multivitamin/day.   Diet Order:   Diet Order             Diet NPO time specified  Diet effective now                   EDUCATION NEEDS:   No education needs have been identified at this time  Skin:  Skin Assessment: Skin Integrity Issues: Skin Integrity Issues:: DTI, Other (Comment) DTI: bilateral heels Other: skin tears to bilateral pre-tibial area, sacrum, and L thigh  Last BM:  12/6 (type 6 x2,  medium amount + large amount)  Height:   Ht Readings from Last 1 Encounters:  11/18/21 6' 1" (1.854 m)    Weight:   Wt Readings from Last 1 Encounters:  11/30/21 80.2 kg    Estimated Nutritional Needs:  Kcal:  2000-2200 kcal Protein:  100-115 grams Fluid:  >/= 2 L/day      , MS, RD, LDN, CNSC Inpatient Clinical Dietitian RD pager # available in AMION  After hours/weekend pager # available in AMION  

## 2021-11-30 NOTE — Progress Notes (Signed)
PROGRESS NOTE    [name] [name]  [id number] DOB: 08-27-1935 DOA: 11/18/2021 PCP: Raymondo Band, MD   Chief Complain:Lethargy  Brief Narrative: Patient is a 86 year old male with history of seizure, Advanced dementia ,nursing home resident who was discharged from here  2 weeks ago for COVID-pneumonia, found to have lethargy on 11:24 AM, per nursing staff patient is normally talkative, aaox1, will even sing to the snf RN, but he was noticed to have increased crackles and rhonchi required suctioning .  He was suspected to have recurrent aspiration pneumonia, so sent here.currently on antibiotics for aspiration pneumonia.  Mental status slowly improving but he is severely dysphasic requiring feeding tube.Speech therapy following .  After discussion with family, we decided to go ahead and put PEG tube.  IR performed PEG placement on 11/26/2021.  Patient is medically stable for discharge to skilled nursing facility as soon as bed is available.  Assessment & Plan:   Principal Problem:   Acute encephalopathy Active Problems:   Essential hypertension   Dementia (HCC)   Seizure (HCC)   Pressure injury of skin   Recurrent pneumonia   Acute metabolic encephalopathy   Acute metabolic encephalopathy, Possibly from recurrent aspiration pneumonia -CT head no acute findings, ammonia level unremarkable, valproic acid level below low therapeutic -Started on IV antibiotics on admission, changed to Augmentin, now he has  finished the course. --Blood culture no growth, urine culture no growth, COVID screening negative -Mental status has improved after risperidone has been discontinued   Left Pleural effusion Monitor, small to moderate as per CT.  Given a dose of  IV Lasix x1, echo showed diastolic dysfunction There are no lower extremity edema Since respiratory status is stable, we will not do thoracentesis at this time   right upper extremity edema venous Doppler negative for  DVT Elevate  right arm   History of seizure On Depakote and Keppra EEG :no seizure    Left hip pain Pelvic x-ray concerning for nondisplaced left femur fracture, recommend CT for further evaluation , CT did not reveal fracture   BPH Oral meds held, cant be given through tube    FTT/Dysphagia/high risk for aspiration Frequent falls in the past when he was at home, he was discharge to snf on 11/11, he is not that ambulatory.  Now has significantly declined.   He has high risks of aspiration in the future.  Underwent PEG tube placement on 11/26/2021.  Plan is to discharge him back to skilled nursing facility    Advanced dementia/from nursing home: Continue supportive care. Delirium  precautions.  On risperidone at bedtime,on hold now due to excessive sleepiness.  Plan is to discharge to SNF.  His previous facility is not accepting him anymore because he has a balance to be paid  Pressure Injury 05/08/21 Coccyx Mid Stage 1 -  Intact skin with non-blanchable redness of a localized area usually over a bony prominence. (Active)  05/08/21 0515  Location: Coccyx  Location Orientation: Mid  Staging: Stage 1 -  Intact skin with non-blanchable redness of a localized area usually over a bony prominence.  Wound Description (Comments):   Present on Admission: Yes     Pressure Injury 10/25/21 Heel Left Deep Tissue Pressure Injury - Purple or maroon localized area of discolored intact skin or blood-filled blister due to damage of underlying soft tissue from pressure and/or shear. purple wound on heel (Active)  10/25/21 0800  Location: Heel  Location Orientation: Left  Staging: Deep Tissue Pressure Injury -  Purple or maroon localized area of discolored intact skin or blood-filled blister due to damage of underlying soft tissue from pressure and/or shear.  Wound Description (Comments): purple wound on heel  Present on Admission:      Pressure Injury 10/25/21 Heel Right Deep Tissue Pressure Injury - Purple or  maroon localized area of discolored intact skin or blood-filled blister due to damage of underlying soft tissue from pressure and/or shear. purple wound on heel (Active)  10/25/21 0800  Location: Heel  Location Orientation: Right  Staging: Deep Tissue Pressure Injury - Purple or maroon localized area of discolored intact skin or blood-filled blister due to damage of underlying soft tissue from pressure and/or shear.  Wound Description (Comments): purple wound on heel  Present on Admission:      Pressure Injury 11/18/21 Sacrum (Active)  11/18/21 2357  Location: Sacrum  Location Orientation:   Staging:   Wound Description (Comments):   Present on Admission: Yes        Nutrition Problem: Inadequate oral intake Etiology: inability to eat      DVT prophylaxis:Lovenox Code Status: Full Family Communication:: Discussed with the wife on phone on 12/6 Patient status:  Dispo: The patient is from: SNF              Anticipated d/c is to: SNF/long term care              Anticipated d/c date is: Medically stable for discharge  Consultants: Palliative care  Procedures: PEG tube placement  Antimicrobials:  Anti-infectives (From admission, onward)    Start     Dose/Rate Route Frequency Ordered Stop   11/26/21 1000  ceFAZolin (ANCEF) IVPB 2g/100 mL premix        2 g 200 mL/hr over 30 Minutes Intravenous To Radiology 11/25/21 1643 11/26/21 1557   11/26/21 0700  ceFAZolin (ANCEF) IVPB 2g/100 mL premix  Status:  Discontinued        2 g 200 mL/hr over 30 Minutes Intravenous To Radiology 11/24/21 1639 11/24/21 1640   11/25/21 1115  ceFAZolin (ANCEF) IVPB 2g/100 mL premix  Status:  Discontinued        2 g 200 mL/hr over 30 Minutes Intravenous To Radiology 11/25/21 1026 11/25/21 1643   11/22/21 1400  amoxicillin-clavulanate (AUGMENTIN) 250-62.5 MG/5ML suspension 500 mg        500 mg Per Tube Every 8 hours 11/22/21 1037 11/25/21 2216   11/19/21 1800  Ampicillin-Sulbactam (UNASYN) 3 g in  sodium chloride 0.9 % 100 mL IVPB  Status:  Discontinued        3 g 200 mL/hr over 30 Minutes Intravenous Every 6 hours 11/19/21 1644 11/22/21 1036   11/19/21 0100  cefTRIAXone (ROCEPHIN) 2 g in sodium chloride 0.9 % 100 mL IVPB  Status:  Discontinued        2 g 200 mL/hr over 30 Minutes Intravenous Every 24 hours 11/18/21 2358 11/19/21 1621   11/19/21 0100  azithromycin (ZITHROMAX) 500 mg in sodium chloride 0.9 % 250 mL IVPB  Status:  Discontinued        500 mg 250 mL/hr over 60 Minutes Intravenous Every 24 hours 11/18/21 2358 11/19/21 1621   11/18/21 2145  vancomycin (VANCOREADY) IVPB 1500 mg/300 mL  Status:  Discontinued        1,500 mg 150 mL/hr over 120 Minutes Intravenous  Once 11/18/21 2137 11/19/21 0023   11/18/21 2145  ceFEPIme (MAXIPIME) 2 g in sodium chloride 0.9 % 100 mL IVPB  Status:  Discontinued  2 g 200 mL/hr over 30 Minutes Intravenous  Once 11/18/21 2138 11/19/21 0007   11/18/21 2130  ceFEPIme (MAXIPIME) 1 g in sodium chloride 0.9 % 100 mL IVPB  Status:  Discontinued        1 g 200 mL/hr over 30 Minutes Intravenous  Once 11/18/21 2125 11/18/21 2138       Subjective:  Patient seen and examined at the bedside this morning.  Hemodynamically stable.  His mental status has significantly improved today.  He was alert and awake and communicating well but still confused and not oriented to place or time.  Not in any kind of distress  Objective: Vitals:   11/29/21 1639 11/29/21 2030 11/30/21 0500 11/30/21 0506  BP: 122/69 (!) 155/75  (!) 164/79  Pulse: 62 60  69  Resp: 18 16  14   Temp: 97.8 F (36.6 C) 98.7 F (37.1 C)  97.8 F (36.6 C)  TempSrc:  Oral  Oral  SpO2: 100% 99%  100%  Weight:   80.2 kg   Height:        Intake/Output Summary (Last 24 hours) at 11/30/2021 0752 Last data filed at 11/30/2021 14/05/2021 Gross per 24 hour  Intake 1680 ml  Output 2051 ml  Net -371 ml   Filed Weights   11/25/21 0500 11/26/21 0500 11/30/21 0500  Weight: 80 kg 80 kg 80.2  kg    Examination:    General exam: Very deconditioned, chronically ill looking, malnourished Respiratory system:  no wheezes or crackles  Cardiovascular system: S1 & S2 heard, RRR.  Gastrointestinal system: Abdomen is nondistended, soft and nontender.  PEG Central nervous system: Alert and awake but not oriented  extremities: Edema of the right upper extremity, no clubbing ,no cyanosis Skin: Pressure ulcers as above  Data Reviewed: I have personally reviewed following labs and imaging studies  CBC: Recent Labs  Lab 11/26/21 0404 11/28/21 0348  WBC 9.0 5.2  NEUTROABS 6.2 3.0  HGB 11.1* 10.0*  HCT 35.9* 33.0*  MCV 86.9 87.1  PLT 190 247   Basic Metabolic Panel: Recent Labs  Lab 11/25/21 0335 11/26/21 0404 11/28/21 0348  NA  --  137 138  K  --  4.4 4.3  CL  --  104 104  CO2  --  28 30  GLUCOSE  --  103* 107*  BUN  --  20 22  CREATININE 0.69 0.60* 0.45*  CALCIUM  --  8.6* 8.3*   GFR: Estimated Creatinine Clearance: 77.7 mL/min (A) (by C-G formula based on SCr of 0.45 mg/dL (L)). Liver Function Tests: No results for input(s): AST, ALT, ALKPHOS, BILITOT, PROT, ALBUMIN in the last 168 hours.  No results for input(s): LIPASE, AMYLASE in the last 168 hours. No results for input(s): AMMONIA in the last 168 hours.  Coagulation Profile: No results for input(s): INR, PROTIME in the last 168 hours.  Cardiac Enzymes: No results for input(s): CKTOTAL, CKMB, CKMBINDEX, TROPONINI in the last 168 hours. BNP (last 3 results) No results for input(s): PROBNP in the last 8760 hours. HbA1C: No results for input(s): HGBA1C in the last 72 hours. CBG: Recent Labs  Lab 11/29/21 1146 11/29/21 1745 11/29/21 2057 11/30/21 0019 11/30/21 0503  GLUCAP 106* 109* 89 95 112*   Lipid Profile: No results for input(s): CHOL, HDL, LDLCALC, TRIG, CHOLHDL, LDLDIRECT in the last 72 hours. Thyroid Function Tests: No results for input(s): TSH, T4TOTAL, FREET4, T3FREE, THYROIDAB in the last  72 hours. Anemia Panel: No results for input(s): VITAMINB12, FOLATE,  FERRITIN, TIBC, IRON, RETICCTPCT in the last 72 hours. Sepsis Labs: No results for input(s): PROCALCITON, LATICACIDVEN in the last 168 hours.   No results found for this or any previous visit (from the past 240 hour(s)).        Radiology Studies: No results found.      Scheduled Meds:  vitamin C  250 mg Per Tube Daily   brinzolamide  1 drop Both Eyes TID   chlorhexidine  15 mL Mouth Rinse BID   donepezil  10 mg Per Tube QHS   enoxaparin (LOVENOX) injection  40 mg Subcutaneous Q24H   feeding supplement (PROSource TF)  45 mL Per Tube Daily   free water  150 mL Per Tube Q6H   guaiFENesin  10 mL Per Tube Q6H   latanoprost  1 drop Both Eyes QHS   levETIRAcetam  500 mg Per Tube BID   mouth rinse  15 mL Mouth Rinse BID   memantine  10 mg Per Tube QHS   multivitamin  15 mL Per Tube Daily   Netarsudil Dimesylate  1 drop Both Eyes QHS   valproic acid  1,000 mg Per Tube Daily   valproic acid  500 mg Per Tube QHS   zinc oxide   Topical BID   Continuous Infusions:  feeding supplement (JEVITY 1.2 CAL) 1,000 mL (11/30/21 0308)     LOS: 11 days    Time spent: 25 mins.More than 50% of that time was spent in counseling and/or coordination of care.      Shelly Coss, MD [facility] P12/05/2021, 7:52 AM

## 2021-11-30 NOTE — TOC Progression Note (Addendum)
Transition of Care [facility]) - Progression Note    Patient Details  Name: [name] [name] MRN: [id number] Date of Birth: [birth year]  Transition [facility]) CM/SW Contact  Golda Acre, RN Phone Number: 11/30/2021, 12:28 PM  Clinical Narrative:    [id number]/tct-wife will accept bed at [facility] health care.  Patton Salles made aware.   [facility] declined due to long term care.  Patient has been self pay at [facility] since admission in November and earlier.  [facility] will take as self pay. Have spoken several times with the wife today and she is to make a decision today.  Per Clyve at [facility] he can come back but the wife will need to make payment arrangements. Wife is aware of this and the urgency of her decision.  Expected Discharge Plan: Skilled Nursing Facility Barriers to Discharge: Continued Medical Work up  Expected Discharge Plan and Services Expected Discharge Plan: Skilled Nursing Facility                                               Social Determinants of Health (SDOH) Interventions    Readmission Risk Interventions Readmission Risk Prevention Plan 10/22/2021  Transportation Screening Complete  Medication Review (RN Care Manager) Referral to Pharmacy  PCP or Specialist appointment within 3-5 days of discharge Complete  HRI or Home Care Consult Not Complete  HRI or Home Care Consult Pt Refusal Comments from SNF  SW Recovery Care/Counseling Consult Complete  Palliative Care Screening Not Applicable  Skilled Nursing Facility Complete  Some recent data might be hidden

## 2021-12-01 LAB — GLUCOSE, CAPILLARY
Glucose-Capillary: 120 mg/dL — ABNORMAL HIGH (ref 70–99)
Glucose-Capillary: 102 mg/dL — ABNORMAL HIGH (ref 70–99)
Glucose-Capillary: 106 mg/dL — ABNORMAL HIGH (ref 70–99)
Glucose-Capillary: 85 mg/dL (ref 70–99)
Glucose-Capillary: 100 mg/dL — ABNORMAL HIGH (ref 70–99)

## 2021-12-01 NOTE — Plan of Care (Signed)
Pt more alert at this time, calling out wife's name, able to follow simple commands and no s/s of respiratory distress, will continue plan of care.  Problem: Clinical Measurements: Goal: Ability to maintain clinical measurements within normal limits will improve Outcome: Progressing   Problem: Clinical Measurements: Goal: Will remain free from infection Outcome: Progressing

## 2021-12-01 NOTE — TOC Progression Note (Addendum)
Transition of Care [facility]) - Progression Note    Patient Details  Name: [name] MRN: [id number] Date of Birth: 17-Oct-1935  Transition of Care [facility]) CM/SW Contact  Golda Acre, RN Phone Number: 12/01/2021, 9:08 AM  Clinical Narrative:    Tct-wife-[name].  Has not made a decision about snf where [place] has not head back from [facility].  Did explain to her that pt will be dcd today and that she will have to make arrangements for him either to go a self pay snf or to make arrangement at home. TCT-Clyde at [facility] pt can not come back until the owed amount is paid in full.  They have excepted partial payment in the past which wife defaulted on.  Central office will not accept unless total amount is paid. Tct-wife given two more places [facility] [place] and [facility] in [place]. Tct-grace at [facility] unable to leave message voice mail is full. Tct-Grace-please have wife call. Tct-Wife please call [facility] to make arrangment. Wife has filed keppro forms,  Wife signed [email or web address] form of acknowledgement. Tct-wife- [facility] has denied continue stay.  VErbally understands. Wife is trying to call [facility]  Expected Discharge Plan: Skilled Nursing Facility Barriers to Discharge: Continued Medical Work up  Expected Discharge Plan and Services Expected Discharge Plan: Skilled Nursing Facility         Expected Discharge Date: 11/30/21                                     Social Determinants of Health (SDOH) Interventions    Readmission Risk Interventions Readmission Risk Prevention Plan 10/22/2021  Transportation Screening Complete  Medication Review [facility]) Referral to Pharmacy  PCP or Specialist appointment within 3-5 days of discharge Complete  HRI or Home Care Consult Not Complete  HRI or Home Care Consult Pt Refusal Comments from SNF  SW Recovery Care/Counseling Consult Complete  Palliative Care Screening Not Applicable  Skilled Nursing  Facility Complete  Some recent data might be hidden

## 2021-12-01 NOTE — Progress Notes (Signed)
[name] [name]  [id number] DOB: 1935/11/02 DOA: 11/18/2021 PCP: Raymondo Band, MD   Chief Complain:Lethargy  Brief Narrative:  Patient is a 86 year old male with history of seizure, Advanced dementia ,nursing home resident who was discharged from here  2 weeks ago for COVID-pneumonia, found to have lethargy per nursing staff patient is normally talkative, aaox1, will even sing to the snf RN, but he was noticed to have increased crackles and rhonchi required suctioning. He was suspected to have recurrent aspiration pneumonia. After discussion with family, we decided to go ahead and put PEG tube.  IR performed PEG placement on 11/26/2021.  Patient is medically stable for discharge to skilled nursing facility as soon as bed is available.  Assessment & Plan:   Principal Problem:   Acute encephalopathy Active Problems:   Essential hypertension   Dementia (HCC)   Seizure (HCC)   Pressure injury of skin   Recurrent pneumonia   Acute metabolic encephalopathy   Acute metabolic encephalopathy, Possibly from recurrent aspiration pneumonia -CT head no acute findings, ammonia level unremarkable, valproic acid level below low therapeutic -Completed antibiotics --Blood culture no growth, urine culture no growth, COVID screening negative -Mental status has improved after risperidone has been discontinued   Left Pleural effusion Monitor, small to moderate as per CT.  Given a dose of  IV Lasix x1, echo showed diastolic dysfunction There are no lower extremity edema Since respiratory status is stable, we will not do thoracentesis at this time   right upper extremity edema venous Doppler negative for  DVT Elevate right arm   History of seizure On Depakote and Keppra EEG :no seizure    Left hip pain Pelvic x-ray concerning for nondisplaced left femur fracture, recommend CT for further evaluation , CT did not reveal fracture   BPH Oral meds held, cant be given  through tube    FTT/Dysphagia/high risk for aspiration Frequent falls in the past when he was at home, he was discharge to snf on 11/11, he is not ambulatory.  Now has significantly declined.   He has high risks of aspiration in the future.  Underwent PEG tube placement on 11/26/2021.  Plan is to discharge him back to skilled nursing facility    Advanced dementia/from nursing home: Continue supportive care. Delirium  precautions.  On risperidone at bedtime, discontinued due to excessive sleepiness.  Plan is to discharge to SNF.  His previous facility is not accepting him anymore because he has a balance to be paid.  Difficult placement due to patient being self-pay as as you stop Medicare days  Pressure Injury 05/08/21 Coccyx Mid Stage 1 -  Intact skin with non-blanchable redness of a localized area usually over a bony prominence. (Active)  05/08/21 0515  Location: Coccyx  Location Orientation: Mid  Staging: Stage 1 -  Intact skin with non-blanchable redness of a localized area usually over a bony prominence.  Wound Description (Comments):   Present on Admission: Yes     Pressure Injury 10/25/21 Heel Left Deep Tissue Pressure Injury - Purple or maroon localized area of discolored intact skin or blood-filled blister due to damage of underlying soft tissue from pressure and/or shear. purple wound on heel (Active)  10/25/21 0800  Location: Heel  Location Orientation: Left  Staging: Deep Tissue Pressure Injury - Purple or maroon localized area of discolored intact skin or blood-filled blister due to damage of underlying soft tissue from pressure and/or shear.  Wound Description (Comments): purple wound on  heel  Present on Admission:      Pressure Injury 10/25/21 Heel Right Deep Tissue Pressure Injury - Purple or maroon localized area of discolored intact skin or blood-filled blister due to damage of underlying soft tissue from pressure and/or shear. purple wound on heel (Active)  10/25/21 0800   Location: Heel  Location Orientation: Right  Staging: Deep Tissue Pressure Injury - Purple or maroon localized area of discolored intact skin or blood-filled blister due to damage of underlying soft tissue from pressure and/or shear.  Wound Description (Comments): purple wound on heel  Present on Admission:      Pressure Injury 11/18/21 Sacrum (Active)  11/18/21 2357  Location: Sacrum  Location Orientation:   Staging:   Wound Description (Comments):   Present on Admission: Yes        Nutrition Problem: Inadequate oral intake Etiology: inability to eat      DVT prophylaxis:Lovenox Code Status: Full Family Communication:: None at bedside Patient status:  Dispo: The patient is from: SNF              Anticipated d/c is to: SNF/long term care              Anticipated d/c date is: Medically stable for discharge  Consultants: Palliative care  Procedures: PEG tube placement  Antimicrobials:  Anti-infectives (From admission, onward)    Start     Dose/Rate Route Frequency Ordered Stop   11/26/21 1000  ceFAZolin (ANCEF) IVPB 2g/100 mL premix        2 g 200 mL/hr over 30 Minutes Intravenous To Radiology 11/25/21 1643 11/26/21 1557   11/26/21 0700  ceFAZolin (ANCEF) IVPB 2g/100 mL premix  Status:  Discontinued        2 g 200 mL/hr over 30 Minutes Intravenous To Radiology 11/24/21 1639 11/24/21 1640   11/25/21 1115  ceFAZolin (ANCEF) IVPB 2g/100 mL premix  Status:  Discontinued        2 g 200 mL/hr over 30 Minutes Intravenous To Radiology 11/25/21 1026 11/25/21 1643   11/22/21 1400  amoxicillin-clavulanate (AUGMENTIN) 250-62.5 MG/5ML suspension 500 mg        500 mg Per Tube Every 8 hours 11/22/21 1037 11/25/21 2216   11/19/21 1800  Ampicillin-Sulbactam (UNASYN) 3 g in sodium chloride 0.9 % 100 mL IVPB  Status:  Discontinued        3 g 200 mL/hr over 30 Minutes Intravenous Every 6 hours 11/19/21 1644 11/22/21 1036   11/19/21 0100  cefTRIAXone (ROCEPHIN) 2 g in sodium  chloride 0.9 % 100 mL IVPB  Status:  Discontinued        2 g 200 mL/hr over 30 Minutes Intravenous Every 24 hours 11/18/21 2358 11/19/21 1621   11/19/21 0100  azithromycin (ZITHROMAX) 500 mg in sodium chloride 0.9 % 250 mL IVPB  Status:  Discontinued        500 mg 250 mL/hr over 60 Minutes Intravenous Every 24 hours 11/18/21 2358 11/19/21 1621   11/18/21 2145  vancomycin (VANCOREADY) IVPB 1500 mg/300 mL  Status:  Discontinued        1,500 mg 150 mL/hr over 120 Minutes Intravenous  Once 11/18/21 2137 11/19/21 0023   11/18/21 2145  ceFEPIme (MAXIPIME) 2 g in sodium chloride 0.9 % 100 mL IVPB  Status:  Discontinued        2 g 200 mL/hr over 30 Minutes Intravenous  Once 11/18/21 2138 11/19/21 0007   11/18/21 2130  ceFEPIme (MAXIPIME) 1 g in sodium chloride 0.9 %  100 mL IVPB  Status:  Discontinued        1 g 200 mL/hr over 30 Minutes Intravenous  Once 11/18/21 2125 11/18/21 2138       Subjective:  Patient seen and examined at bedside, denies any new complaints.  Appears comfortable.  Objective: Vitals:   11/30/21 1251 11/30/21 2002 12/01/21 0417 12/01/21 1356  BP: 139/85 (!) 153/86 (!) 157/87 (!) 155/88  Pulse: 62 (!) 58 74 67  Resp: 18 14 14 20   Temp: 97.7 F (36.5 C) 97.7 F (36.5 C) 98.2 F (36.8 C) 97.9 F (36.6 C)  TempSrc: Oral Oral  Oral  SpO2: 100% 100% 92% 100%  Weight:      Height:        Intake/Output Summary (Last 24 hours) at 12/01/2021 1927 Last data filed at 12/01/2021 0600 Gross per 24 hour  Intake 2280 ml  Output 2600 ml  Net -320 ml   Filed Weights   11/25/21 0500 11/26/21 0500 11/30/21 0500  Weight: 80 kg 80 kg 80.2 kg    Examination: General: NAD, chronically ill-appearing, very deconditioned Cardiovascular: S1, S2 present Respiratory: CTAB Abdomen: Soft, nontender, nondistended, bowel sounds present Musculoskeletal: No bilateral pedal edema noted Skin: Pressure ulcers as above Psychiatry: Normal mood      Data Reviewed: I have personally  reviewed following labs and imaging studies  CBC: Recent Labs  Lab 11/26/21 0404 11/28/21 0348  WBC 9.0 5.2  NEUTROABS 6.2 3.0  HGB 11.1* 10.0*  HCT 35.9* 33.0*  MCV 86.9 87.1  PLT 190 [id number]   Basic Metabolic Panel: Recent Labs  Lab 11/25/21 0335 11/26/21 0404 11/28/21 0348  NA  --  137 138  K  --  4.4 4.3  CL  --  104 104  CO2  --  28 30  GLUCOSE  --  103* 107*  BUN  --  20 22  CREATININE 0.69 0.60* 0.45*  CALCIUM  --  8.6* 8.3*   GFR: Estimated Creatinine Clearance: 77.7 mL/min (A) (by C-G formula based on SCr of 0.45 mg/dL (L)). Liver Function Tests: No results for input(s): AST, ALT, ALKPHOS, BILITOT, PROT, ALBUMIN in the last 168 hours.  No results for input(s): LIPASE, AMYLASE in the last 168 hours. No results for input(s): AMMONIA in the last 168 hours.  Coagulation Profile: No results for input(s): INR, PROTIME in the last 168 hours.  Cardiac Enzymes: No results for input(s): CKTOTAL, CKMB, CKMBINDEX, TROPONINI in the last 168 hours. BNP (last 3 results) No results for input(s): PROBNP in the last 8760 hours. HbA1C: No results for input(s): HGBA1C in the last 72 hours. CBG: Recent Labs  Lab 11/30/21 2329 12/01/21 0414 12/01/21 0730 12/01/21 1137 12/01/21 1720  GLUCAP 86 120* 102* 106* 85   Lipid Profile: No results for input(s): CHOL, HDL, LDLCALC, TRIG, CHOLHDL, LDLDIRECT in the last 72 hours. Thyroid Function Tests: No results for input(s): TSH, T4TOTAL, FREET4, T3FREE, THYROIDAB in the last 72 hours. Anemia Panel: No results for input(s): VITAMINB12, FOLATE, FERRITIN, TIBC, IRON, RETICCTPCT in the last 72 hours. Sepsis Labs: No results for input(s): PROCALCITON, LATICACIDVEN in the last 168 hours.   Recent Results (from the past 240 hour(s))  Resp Panel by RT-PCR (Flu A&B, Covid) Nasopharyngeal Swab     Status: None   Collection Time: 11/30/21  1:29 PM   Specimen: Nasopharyngeal Swab; Nasopharyngeal(NP) swabs in vial transport medium   Result Value Ref Range Status   SARS Coronavirus 2 by RT PCR NEGATIVE NEGATIVE Final  Comment: (NOTE) SARS-CoV-2 target nucleic acids are NOT DETECTED.  The SARS-CoV-2 RNA is generally detectable in upper respiratory specimens during the acute phase of infection. The lowest concentration of SARS-CoV-2 viral copies this assay can detect is 138 copies/mL. A negative result does not preclude SARS-Cov-2 infection and should not be used as the sole basis for treatment or other patient management decisions. A negative result may occur with  improper specimen collection/handling, submission of specimen other than nasopharyngeal swab, presence of viral mutation(s) within the areas targeted by this assay, and inadequate number of viral copies(<138 copies/mL). A negative result must be combined with clinical observations, patient history, and epidemiological information. The expected result is Negative.  Fact Sheet for Patients:  BloggerCourse.com  Fact Sheet for Healthcare Providers:  SeriousBroker.it  This test is no t yet approved or cleared by the [place] FDA and  has been authorized for detection and/or diagnosis of SARS-CoV-2 by FDA under an Emergency Use Authorization (EUA). This EUA will remain  in effect (meaning this test can be used) for the duration of the COVID-19 declaration under Section 564(b)(1) of the Act, 21 U.S.C.section 360bbb-3(b)(1), unless the authorization is terminated  or revoked sooner.       Influenza A by PCR NEGATIVE NEGATIVE Final   Influenza B by PCR NEGATIVE NEGATIVE Final    Comment: (NOTE) The Xpert Xpress SARS-CoV-2/FLU/RSV plus assay is intended as an aid in the diagnosis of influenza from Nasopharyngeal swab specimens and should not be used as a sole basis for treatment. Nasal washings and aspirates are unacceptable for Xpert Xpress SARS-CoV-2/FLU/RSV testing.  Fact Sheet for  Patients: BloggerCourse.com  Fact Sheet for Healthcare Providers: SeriousBroker.it  This test is not yet approved or cleared by the [place] FDA and has been authorized for detection and/or diagnosis of SARS-CoV-2 by FDA under an Emergency Use Authorization (EUA). This EUA will remain in effect (meaning this test can be used) for the duration of the COVID-19 declaration under Section 564(b)(1) of the Act, 21 U.S.C. section 360bbb-3(b)(1), unless the authorization is terminated or revoked.  Performed at [facility], [address], [place], [place] [address]           Radiology Studies: No results found.      Scheduled Meds:  vitamin C  250 mg Per Tube Daily   brinzolamide  1 drop Both Eyes TID   chlorhexidine  15 mL Mouth Rinse BID   donepezil  10 mg Per Tube QHS   enoxaparin (LOVENOX) injection  40 mg Subcutaneous Q24H   feeding supplement (PROSource TF)  45 mL Per Tube Daily   free water  150 mL Per Tube Q6H   guaiFENesin  10 mL Per Tube Q6H   latanoprost  1 drop Both Eyes QHS   levETIRAcetam  500 mg Per Tube BID   mouth rinse  15 mL Mouth Rinse BID   memantine  10 mg Per Tube QHS   multivitamin  15 mL Per Tube Daily   Netarsudil Dimesylate  1 drop Both Eyes QHS   valproic acid  1,000 mg Per Tube Daily   valproic acid  500 mg Per Tube QHS   zinc oxide   Topical BID   Continuous Infusions:  feeding supplement (JEVITY 1.2 CAL) 1,000 mL (11/30/21 0308)     LOS: 12 days          Briant Cedar, MD [facility] P12/06/2021, 7:27 PM

## 2021-12-02 LAB — GLUCOSE, CAPILLARY
Glucose-Capillary: 102 mg/dL — ABNORMAL HIGH (ref 70–99)
Glucose-Capillary: 114 mg/dL — ABNORMAL HIGH (ref 70–99)
Glucose-Capillary: 78 mg/dL (ref 70–99)
Glucose-Capillary: 86 mg/dL (ref 70–99)
Glucose-Capillary: 91 mg/dL (ref 70–99)
Glucose-Capillary: 98 mg/dL (ref 70–99)

## 2021-12-02 LAB — BASIC METABOLIC PANEL
Anion gap: 5 (ref 5–15)
BUN: 15 mg/dL (ref 8–23)
CO2: 29 mmol/L (ref 22–32)
Calcium: 8.5 mg/dL — ABNORMAL LOW (ref 8.9–10.3)
Chloride: 102 mmol/L (ref 98–111)
Creatinine, Ser: 0.47 mg/dL — ABNORMAL LOW (ref 0.61–1.24)
GFR, Estimated: >60 mL/min (ref >60)
Glucose, Bld: 112 mg/dL — ABNORMAL HIGH (ref 70–99)
Potassium: 4.3 mmol/L (ref 3.5–5.1)
Sodium: 136 mmol/L (ref 135–145)

## 2021-12-02 LAB — CBC
HCT: 36.1 % — ABNORMAL LOW (ref 39.0–52.0)
Hemoglobin: 11.3 g/dL — ABNORMAL LOW (ref 13.0–17.0)
MCH: 27.4 pg (ref 26.0–34.0)
MCHC: 31.3 g/dL (ref 30.0–36.0)
MCV: 87.6 fL (ref 80.0–100.0)
Platelets: 312 10*3/uL (ref 150–400)
RBC: 4.12 MIL/uL — ABNORMAL LOW (ref 4.22–5.81)
RDW: 13.8 % (ref 11.5–15.5)
WBC: 4.3 10*3/uL (ref 4.0–10.5)
nRBC: 0 % (ref 0.0–0.2)

## 2021-12-02 NOTE — Plan of Care (Signed)
?  Problem: Clinical Measurements: ?Goal: Will remain free from infection ?Outcome: Progressing ?  ?

## 2021-12-02 NOTE — Progress Notes (Signed)
PROGRESS NOTE    [name] [name]  [id number] DOB: 12-20-1935 DOA: 11/18/2021 PCP: Raymondo Band, MD   Chief Complain:Lethargy  Brief Narrative:  Patient is a 86 year old male with history of seizure, Advanced dementia ,nursing home resident who was discharged from here  2 weeks ago for COVID-pneumonia, found to have lethargy per nursing staff patient is normally talkative, aaox1, will even sing to the snf RN, but he was noticed to have increased crackles and rhonchi required suctioning. He was suspected to have recurrent aspiration pneumonia. After discussion with family, we decided to go ahead and put PEG tube.  IR performed PEG placement on 11/26/2021.  Patient is medically stable for discharge to skilled nursing facility as soon as bed is available.  Assessment & Plan:   Principal Problem:   Acute encephalopathy Active Problems:   Essential hypertension   Dementia (HCC)   Seizure (HCC)   Pressure injury of skin   Recurrent pneumonia   Acute metabolic encephalopathy   Acute metabolic encephalopathy, Possibly from recurrent aspiration pneumonia CT head no acute findings, ammonia level unremarkable, valproic acid level below low therapeutic Completed antibiotics Blood culture no growth, urine culture no growth, COVID screening negative Mental status has improved after risperidone has been discontinued   Left Pleural effusion Monitor, small to moderate as per CT.  Given a dose of  IV Lasix x1, echo showed diastolic dysfunction Since respiratory status is stable, we will not do thoracentesis at this time   Right upper extremity edema venous Doppler negative for  DVT Elevate right arm   History of seizure On Depakote and Keppra EEG :no seizure    Left hip pain Pelvic x-ray concerning for nondisplaced left femur fracture, recommend CT for further evaluation , CT did not reveal fracture   BPH Oral meds held, cant be given through tube    FTT/Dysphagia/high risk  for aspiration Frequent falls in the past when he was at home, he was discharge to snf on 11/11, he is not ambulatory.  Now has significantly declined.   He has high risks of aspiration in the future.  Underwent PEG tube placement on 11/26/2021.  Plan is to discharge him back to skilled nursing facility    Advanced dementia/from nursing home: Continue supportive care. Delirium  precautions.  On risperidone at bedtime, discontinued due to excessive sleepiness.  Plan is to discharge to SNF.  His previous facility is not accepting him anymore because he has a balance to be paid.  Difficult placement due to patient being self-pay as as you stop Medicare days  Pressure Injury 05/08/21 Coccyx Mid Stage 1 -  Intact skin with non-blanchable redness of a localized area usually over a bony prominence. (Active)  05/08/21 0515  Location: Coccyx  Location Orientation: Mid  Staging: Stage 1 -  Intact skin with non-blanchable redness of a localized area usually over a bony prominence.  Wound Description (Comments):   Present on Admission: Yes     Pressure Injury 10/25/21 Heel Left Deep Tissue Pressure Injury - Purple or maroon localized area of discolored intact skin or blood-filled blister due to damage of underlying soft tissue from pressure and/or shear. purple wound on heel (Active)  10/25/21 0800  Location: Heel  Location Orientation: Left  Staging: Deep Tissue Pressure Injury - Purple or maroon localized area of discolored intact skin or blood-filled blister due to damage of underlying soft tissue from pressure and/or shear.  Wound Description (Comments): purple wound on heel  Present on Admission:  Pressure Injury 10/25/21 Heel Right Deep Tissue Pressure Injury - Purple or maroon localized area of discolored intact skin or blood-filled blister due to damage of underlying soft tissue from pressure and/or shear. purple wound on heel (Active)  10/25/21 0800  Location: Heel  Location Orientation:  Right  Staging: Deep Tissue Pressure Injury - Purple or maroon localized area of discolored intact skin or blood-filled blister due to damage of underlying soft tissue from pressure and/or shear.  Wound Description (Comments): purple wound on heel  Present on Admission:      Pressure Injury 11/18/21 Sacrum (Active)  11/18/21 2357  Location: Sacrum  Location Orientation:   Staging:   Wound Description (Comments):   Present on Admission: Yes        Nutrition Problem: Inadequate oral intake Etiology: inability to eat      DVT prophylaxis:Lovenox Code Status: Full Family Communication:: None at bedside Patient status:  Dispo: The patient is from: SNF              Anticipated d/c is to: SNF/long term care              Anticipated d/c date is: Medically stable for discharge  Consultants: Palliative care  Procedures: PEG tube placement  Antimicrobials:  Anti-infectives (From admission, onward)    Start     Dose/Rate Route Frequency Ordered Stop   11/26/21 1000  ceFAZolin (ANCEF) IVPB 2g/100 mL premix        2 g 200 mL/hr over 30 Minutes Intravenous To Radiology 11/25/21 1643 11/26/21 1557   11/26/21 0700  ceFAZolin (ANCEF) IVPB 2g/100 mL premix  Status:  Discontinued        2 g 200 mL/hr over 30 Minutes Intravenous To Radiology 11/24/21 1639 11/24/21 1640   11/25/21 1115  ceFAZolin (ANCEF) IVPB 2g/100 mL premix  Status:  Discontinued        2 g 200 mL/hr over 30 Minutes Intravenous To Radiology 11/25/21 1026 11/25/21 1643   11/22/21 1400  amoxicillin-clavulanate (AUGMENTIN) 250-62.5 MG/5ML suspension 500 mg        500 mg Per Tube Every 8 hours 11/22/21 1037 11/25/21 2216   11/19/21 1800  Ampicillin-Sulbactam (UNASYN) 3 g in sodium chloride 0.9 % 100 mL IVPB  Status:  Discontinued        3 g 200 mL/hr over 30 Minutes Intravenous Every 6 hours 11/19/21 1644 11/22/21 1036   11/19/21 0100  cefTRIAXone (ROCEPHIN) 2 g in sodium chloride 0.9 % 100 mL IVPB  Status:   Discontinued        2 g 200 mL/hr over 30 Minutes Intravenous Every 24 hours 11/18/21 2358 11/19/21 1621   11/19/21 0100  azithromycin (ZITHROMAX) 500 mg in sodium chloride 0.9 % 250 mL IVPB  Status:  Discontinued        500 mg 250 mL/hr over 60 Minutes Intravenous Every 24 hours 11/18/21 2358 11/19/21 1621   11/18/21 2145  vancomycin (VANCOREADY) IVPB 1500 mg/300 mL  Status:  Discontinued        1,500 mg 150 mL/hr over 120 Minutes Intravenous  Once 11/18/21 2137 11/19/21 0023   11/18/21 2145  ceFEPIme (MAXIPIME) 2 g in sodium chloride 0.9 % 100 mL IVPB  Status:  Discontinued        2 g 200 mL/hr over 30 Minutes Intravenous  Once 11/18/21 2138 11/19/21 0007   11/18/21 2130  ceFEPIme (MAXIPIME) 1 g in sodium chloride 0.9 % 100 mL IVPB  Status:  Discontinued  1 g 200 mL/hr over 30 Minutes Intravenous  Once 11/18/21 2125 11/18/21 2138       Subjective:  Patient seen and examined at bedside.  Noted to have some loose stools, likely 2/2 tube feedings.  We will continue to monitor.  Otherwise, no new complaints, looks comfortable.  Objective: Vitals:   12/01/21 1356 12/01/21 2028 12/02/21 0413 12/02/21 1359  BP: (!) 155/88 (!) 148/94 137/73 (!) 179/86  Pulse: 67 61 69 61  Resp: 20 16 16 16   Temp: 97.9 F (36.6 C) 97.9 F (36.6 C) 98 F (36.7 C) 97.7 F (36.5 C)  TempSrc: Oral Oral Oral Oral  SpO2: 100% 100% 98% 100%  Weight:      Height:        Intake/Output Summary (Last 24 hours) at 12/02/2021 1427 Last data filed at 12/02/2021 0630 Gross per 24 hour  Intake 2613.83 ml  Output 1775 ml  Net 838.83 ml   Filed Weights   11/25/21 0500 11/26/21 0500 11/30/21 0500  Weight: 80 kg 80 kg 80.2 kg    Examination: General: NAD, chronically ill-appearing, very deconditioned Cardiovascular: S1, S2 present Respiratory: CTAB Abdomen: Soft, nontender, nondistended, bowel sounds present Musculoskeletal: No bilateral pedal edema noted Skin: Pressure ulcers as above Psychiatry:  Normal mood      Data Reviewed: I have personally reviewed following labs and imaging studies  CBC: Recent Labs  Lab 11/26/21 0404 11/28/21 0348 12/02/21 0512  WBC 9.0 5.2 4.3  NEUTROABS 6.2 3.0  --   HGB 11.1* 10.0* 11.3*  HCT 35.9* 33.0* 36.1*  MCV 86.9 87.1 87.6  PLT 190 247 [id number]   Basic Metabolic Panel: Recent Labs  Lab 11/26/21 0404 11/28/21 0348 12/02/21 0512  NA 137 138 136  K 4.4 4.3 4.3  CL 104 104 102  CO2 28 30 29   GLUCOSE 103* 107* 112*  BUN 20 22 15   CREATININE 0.60* 0.45* 0.47*  CALCIUM 8.6* 8.3* 8.5*   GFR: Estimated Creatinine Clearance: 77.7 mL/min (A) (by C-G formula based on SCr of 0.47 mg/dL (L)). Liver Function Tests: No results for input(s): AST, ALT, ALKPHOS, BILITOT, PROT, ALBUMIN in the last 168 hours.  No results for input(s): LIPASE, AMYLASE in the last 168 hours. No results for input(s): AMMONIA in the last 168 hours.  Coagulation Profile: No results for input(s): INR, PROTIME in the last 168 hours.  Cardiac Enzymes: No results for input(s): CKTOTAL, CKMB, CKMBINDEX, TROPONINI in the last 168 hours. BNP (last 3 results) No results for input(s): PROBNP in the last 8760 hours. HbA1C: No results for input(s): HGBA1C in the last 72 hours. CBG: Recent Labs  Lab 12/01/21 2024 12/02/21 0020 12/02/21 0408 12/02/21 0754 12/02/21 1133  GLUCAP 100* 78 91 86 98   Lipid Profile: No results for input(s): CHOL, HDL, LDLCALC, TRIG, CHOLHDL, LDLDIRECT in the last 72 hours. Thyroid Function Tests: No results for input(s): TSH, T4TOTAL, FREET4, T3FREE, THYROIDAB in the last 72 hours. Anemia Panel: No results for input(s): VITAMINB12, FOLATE, FERRITIN, TIBC, IRON, RETICCTPCT in the last 72 hours. Sepsis Labs: No results for input(s): PROCALCITON, LATICACIDVEN in the last 168 hours.   Recent Results (from the past 240 hour(s))  Resp Panel by RT-PCR (Flu A&B, Covid) Nasopharyngeal Swab     Status: None   Collection Time: 11/30/21  1:29 PM    Specimen: Nasopharyngeal Swab; Nasopharyngeal(NP) swabs in vial transport medium  Result Value Ref Range Status   SARS Coronavirus 2 by RT PCR NEGATIVE NEGATIVE Final  Comment: (NOTE) SARS-CoV-2 target nucleic acids are NOT DETECTED.  The SARS-CoV-2 RNA is generally detectable in upper respiratory specimens during the acute phase of infection. The lowest concentration of SARS-CoV-2 viral copies this assay can detect is 138 copies/mL. A negative result does not preclude SARS-Cov-2 infection and should not be used as the sole basis for treatment or other patient management decisions. A negative result may occur with  improper specimen collection/handling, submission of specimen other than nasopharyngeal swab, presence of viral mutation(s) within the areas targeted by this assay, and inadequate number of viral copies(<138 copies/mL). A negative result must be combined with clinical observations, patient history, and epidemiological information. The expected result is Negative.  Fact Sheet for Patients:  BloggerCourse.com  Fact Sheet for Healthcare Providers:  SeriousBroker.it  This test is no t yet approved or cleared by the [place] FDA and  has been authorized for detection and/or diagnosis of SARS-CoV-2 by FDA under an Emergency Use Authorization (EUA). This EUA will remain  in effect (meaning this test can be used) for the duration of the COVID-19 declaration under Section 564(b)(1) of the Act, 21 U.S.C.section 360bbb-3(b)(1), unless the authorization is terminated  or revoked sooner.       Influenza A by PCR NEGATIVE NEGATIVE Final   Influenza B by PCR NEGATIVE NEGATIVE Final    Comment: (NOTE) The Xpert Xpress SARS-CoV-2/FLU/RSV plus assay is intended as an aid in the diagnosis of influenza from Nasopharyngeal swab specimens and should not be used as a sole basis for treatment. Nasal washings and aspirates are  unacceptable for Xpert Xpress SARS-CoV-2/FLU/RSV testing.  Fact Sheet for Patients: BloggerCourse.com  Fact Sheet for Healthcare Providers: SeriousBroker.it  This test is not yet approved or cleared by the [place] FDA and has been authorized for detection and/or diagnosis of SARS-CoV-2 by FDA under an Emergency Use Authorization (EUA). This EUA will remain in effect (meaning this test can be used) for the duration of the COVID-19 declaration under Section 564(b)(1) of the Act, 21 U.S.C. section 360bbb-3(b)(1), unless the authorization is terminated or revoked.  Performed at [facility], [address] [place], [place] [address]           Radiology Studies: No results found.      Scheduled Meds:  vitamin C  250 mg Per Tube Daily   brinzolamide  1 drop Both Eyes TID   chlorhexidine  15 mL Mouth Rinse BID   donepezil  10 mg Per Tube QHS   enoxaparin (LOVENOX) injection  40 mg Subcutaneous Q24H   feeding supplement (PROSource TF)  45 mL Per Tube Daily   free water  150 mL Per Tube Q6H   guaiFENesin  10 mL Per Tube Q6H   latanoprost  1 drop Both Eyes QHS   levETIRAcetam  500 mg Per Tube BID   mouth rinse  15 mL Mouth Rinse BID   memantine  10 mg Per Tube QHS   multivitamin  15 mL Per Tube Daily   Netarsudil Dimesylate  1 drop Both Eyes QHS   valproic acid  1,000 mg Per Tube Daily   valproic acid  500 mg Per Tube QHS   zinc oxide   Topical BID   Continuous Infusions:  feeding supplement (JEVITY 1.2 CAL) 1,000 mL (12/02/21 0629)     LOS: 13 days          Briant Cedar, MD [facility] P12/07/2021, 2:27 PM

## 2021-12-03 LAB — GLUCOSE, CAPILLARY
Glucose-Capillary: 103 mg/dL — ABNORMAL HIGH (ref 70–99)
Glucose-Capillary: 104 mg/dL — ABNORMAL HIGH (ref 70–99)
Glucose-Capillary: 106 mg/dL — ABNORMAL HIGH (ref 70–99)
Glucose-Capillary: 111 mg/dL — ABNORMAL HIGH (ref 70–99)
Glucose-Capillary: 89 mg/dL (ref 70–99)
Glucose-Capillary: 94 mg/dL (ref 70–99)

## 2021-12-03 NOTE — Plan of Care (Signed)

## 2021-12-03 NOTE — Plan of Care (Signed)

## 2021-12-03 NOTE — TOC Progression Note (Signed)
Transition of Care [facility]) - Progression Note    Patient Details  Name: [name] [name] MRN: [id number] Date of Birth: [birth year]  [facility] CM/SW Contact  Geni Bers, RN Phone Number: 12/03/2021, 5:10 PM  Clinical Narrative:    Spoke with Mrs. Stidd concerning discharge. Mrs. Ponciano states the hospital is to find a place for pt to go. Mrs. Force was instructed again to speak with [facility]. Pt is denied with [facility] and Mrs. Chacko will need to  Pay for SNF. Private pay.    Expected Discharge Plan: Skilled Nursing Facility Barriers to Discharge: Continued Medical Work up  Expected Discharge Plan and Services Expected Discharge Plan: Skilled Nursing Facility         Expected Discharge Date: 11/30/21                                     Social Determinants of Health (SDOH) Interventions    Readmission Risk Interventions Readmission Risk Prevention Plan 10/22/2021  Transportation Screening Complete  Medication Review [employer]) Referral to Pharmacy  PCP or Specialist appointment within 3-5 days of discharge Complete  HRI or Home Care Consult Not Complete  HRI or Home Care Consult Pt Refusal Comments from SNF  SW Recovery Care/Counseling Consult Complete  Palliative Care Screening Not Applicable  Skilled Nursing Facility Complete  Some recent data might be hidden

## 2021-12-03 NOTE — Progress Notes (Signed)
PROGRESS NOTE    [name] [name]  [id number] DOB: 17-May-1935 DOA: 11/18/2021 PCP: Raymondo Band, MD   Chief Complain:Lethargy  Brief Narrative:  Patient is a 86 year old male with history of seizure, Advanced dementia ,nursing home resident who was discharged from here  2 weeks ago for COVID-pneumonia, found to have lethargy per nursing staff patient is normally talkative, aaox1, will even sing to the snf RN, but he was noticed to have increased crackles and rhonchi required suctioning. He was suspected to have recurrent aspiration pneumonia. After discussion with family, we decided to go ahead and put PEG tube.  IR performed PEG placement on 11/26/2021.  Patient is medically stable for discharge to skilled nursing facility as soon as bed is available.  Assessment & Plan:   Principal Problem:   Acute encephalopathy Active Problems:   Essential hypertension   Dementia (HCC)   Seizure (HCC)   Pressure injury of skin   Recurrent pneumonia   Acute metabolic encephalopathy   Acute metabolic encephalopathy, Possibly from recurrent aspiration pneumonia CT head no acute findings, ammonia level unremarkable, valproic acid level below low therapeutic Completed antibiotics Blood culture no growth, urine culture no growth, COVID screening negative Mental status has improved after risperidone has been discontinued   Left Pleural effusion Monitor, small to moderate as per CT.  Given a dose of  IV Lasix x1, echo showed diastolic dysfunction Since respiratory status is stable, we will not do thoracentesis at this time   Right upper extremity edema venous Doppler negative for  DVT Elevate right arm   History of seizure On Depakote and Keppra EEG :no seizure    Left hip pain Pelvic x-ray concerning for nondisplaced left femur fracture, recommend CT for further evaluation , CT did not reveal fracture   BPH Oral meds held, cant be given through tube    FTT/Dysphagia/high risk  for aspiration Frequent falls in the past when he was at home, he was discharge to snf on 11/11, he is not ambulatory.  Now has significantly declined.   He has high risks of aspiration in the future.  Underwent PEG tube placement on 11/26/2021.  Plan is to discharge him back to skilled nursing facility    Advanced dementia/from nursing home: Continue supportive care. Delirium  precautions.  On risperidone at bedtime, discontinued due to excessive sleepiness.  Plan is to discharge to SNF.  His previous facility is not accepting him anymore because he has a balance to be paid.  Difficult placement due to patient being self-pay as has used up Medicare days  Pressure Injury 05/08/21 Coccyx Mid Stage 1 -  Intact skin with non-blanchable redness of a localized area usually over a bony prominence. (Active)  05/08/21 0515  Location: Coccyx  Location Orientation: Mid  Staging: Stage 1 -  Intact skin with non-blanchable redness of a localized area usually over a bony prominence.  Wound Description (Comments):   Present on Admission: Yes     Pressure Injury 10/25/21 Heel Left Deep Tissue Pressure Injury - Purple or maroon localized area of discolored intact skin or blood-filled blister due to damage of underlying soft tissue from pressure and/or shear. purple wound on heel (Active)  10/25/21 0800  Location: Heel  Location Orientation: Left  Staging: Deep Tissue Pressure Injury - Purple or maroon localized area of discolored intact skin or blood-filled blister due to damage of underlying soft tissue from pressure and/or shear.  Wound Description (Comments): purple wound on heel  Present on Admission:  Pressure Injury 10/25/21 Heel Right Deep Tissue Pressure Injury - Purple or maroon localized area of discolored intact skin or blood-filled blister due to damage of underlying soft tissue from pressure and/or shear. purple wound on heel (Active)  10/25/21 0800  Location: Heel  Location Orientation:  Right  Staging: Deep Tissue Pressure Injury - Purple or maroon localized area of discolored intact skin or blood-filled blister due to damage of underlying soft tissue from pressure and/or shear.  Wound Description (Comments): purple wound on heel  Present on Admission:      Pressure Injury 11/18/21 Sacrum (Active)  11/18/21 2357  Location: Sacrum  Location Orientation:   Staging:   Wound Description (Comments):   Present on Admission: Yes        Nutrition Problem: Inadequate oral intake Etiology: inability to eat      DVT prophylaxis:Lovenox Code Status: Full Family Communication:: None at bedside Patient status:  Dispo: The patient is from: SNF              Anticipated d/c is to: SNF/long term care              Anticipated d/c date is: Medically stable for discharge  Consultants: Palliative care  Procedures: PEG tube placement  Antimicrobials:  Anti-infectives (From admission, onward)    Start     Dose/Rate Route Frequency Ordered Stop   11/26/21 1000  ceFAZolin (ANCEF) IVPB 2g/100 mL premix        2 g 200 mL/hr over 30 Minutes Intravenous To Radiology 11/25/21 1643 11/26/21 1557   11/26/21 0700  ceFAZolin (ANCEF) IVPB 2g/100 mL premix  Status:  Discontinued        2 g 200 mL/hr over 30 Minutes Intravenous To Radiology 11/24/21 1639 11/24/21 1640   11/25/21 1115  ceFAZolin (ANCEF) IVPB 2g/100 mL premix  Status:  Discontinued        2 g 200 mL/hr over 30 Minutes Intravenous To Radiology 11/25/21 1026 11/25/21 1643   11/22/21 1400  amoxicillin-clavulanate (AUGMENTIN) 250-62.5 MG/5ML suspension 500 mg        500 mg Per Tube Every 8 hours 11/22/21 1037 11/25/21 2216   11/19/21 1800  Ampicillin-Sulbactam (UNASYN) 3 g in sodium chloride 0.9 % 100 mL IVPB  Status:  Discontinued        3 g 200 mL/hr over 30 Minutes Intravenous Every 6 hours 11/19/21 1644 11/22/21 1036   11/19/21 0100  cefTRIAXone (ROCEPHIN) 2 g in sodium chloride 0.9 % 100 mL IVPB  Status:   Discontinued        2 g 200 mL/hr over 30 Minutes Intravenous Every 24 hours 11/18/21 2358 11/19/21 1621   11/19/21 0100  azithromycin (ZITHROMAX) 500 mg in sodium chloride 0.9 % 250 mL IVPB  Status:  Discontinued        500 mg 250 mL/hr over 60 Minutes Intravenous Every 24 hours 11/18/21 2358 11/19/21 1621   11/18/21 2145  vancomycin (VANCOREADY) IVPB 1500 mg/300 mL  Status:  Discontinued        1,500 mg 150 mL/hr over 120 Minutes Intravenous  Once 11/18/21 2137 11/19/21 0023   11/18/21 2145  ceFEPIme (MAXIPIME) 2 g in sodium chloride 0.9 % 100 mL IVPB  Status:  Discontinued        2 g 200 mL/hr over 30 Minutes Intravenous  Once 11/18/21 2138 11/19/21 0007   11/18/21 2130  ceFEPIme (MAXIPIME) 1 g in sodium chloride 0.9 % 100 mL IVPB  Status:  Discontinued  1 g 200 mL/hr over 30 Minutes Intravenous  Once 11/18/21 2125 11/18/21 2138       Subjective:  No new complaints   Objective: Vitals:   12/03/21 0000 12/03/21 0428 12/03/21 0500 12/03/21 1311  BP: (!) 175/84 (!) 172/91  (!) 164/92  Pulse:  71  68  Resp:  20  18  Temp:  97.8 F (36.6 C)  97.8 F (36.6 C)  TempSrc:  Oral  Oral  SpO2:  100%  100%  Weight:   80 kg   Height:        Intake/Output Summary (Last 24 hours) at 12/03/2021 1906 Last data filed at 12/03/2021 1800 Gross per 24 hour  Intake 1920 ml  Output 1550 ml  Net 370 ml   Filed Weights   11/26/21 0500 11/30/21 0500 12/03/21 0500  Weight: 80 kg 80.2 kg 80 kg    Examination: General: NAD, chronically ill-appearing, very deconditioned Cardiovascular: S1, S2 present Respiratory: CTAB Abdomen: Soft, nontender, nondistended, bowel sounds present Musculoskeletal: No bilateral pedal edema noted Skin: Pressure ulcers as above Psychiatry: Normal mood      Data Reviewed: I have personally reviewed following labs and imaging studies  CBC: Recent Labs  Lab 11/28/21 0348 12/02/21 0512  WBC 5.2 4.3  NEUTROABS 3.0  --   HGB 10.0* 11.3*  HCT 33.0*  36.1*  MCV 87.1 87.6  PLT 247 312   Basic Metabolic Panel: Recent Labs  Lab 11/28/21 0348 12/02/21 0512  NA 138 136  K 4.3 4.3  CL 104 102  CO2 30 29  GLUCOSE 107* 112*  BUN 22 15  CREATININE 0.45* 0.47*  CALCIUM 8.3* 8.5*   GFR: Estimated Creatinine Clearance: 77.7 mL/min (A) (by C-G formula based on SCr of 0.47 mg/dL (L)). Liver Function Tests: No results for input(s): AST, ALT, ALKPHOS, BILITOT, PROT, ALBUMIN in the last 168 hours.  No results for input(s): LIPASE, AMYLASE in the last 168 hours. No results for input(s): AMMONIA in the last 168 hours.  Coagulation Profile: No results for input(s): INR, PROTIME in the last 168 hours.  Cardiac Enzymes: No results for input(s): CKTOTAL, CKMB, CKMBINDEX, TROPONINI in the last 168 hours. BNP (last 3 results) No results for input(s): PROBNP in the last 8760 hours. HbA1C: No results for input(s): HGBA1C in the last 72 hours. CBG: Recent Labs  Lab 12/03/21 0013 12/03/21 0414 12/03/21 0728 12/03/21 1137 12/03/21 1636  GLUCAP 106* 104* 103* 111* 89   Lipid Profile: No results for input(s): CHOL, HDL, LDLCALC, TRIG, CHOLHDL, LDLDIRECT in the last 72 hours. Thyroid Function Tests: No results for input(s): TSH, T4TOTAL, FREET4, T3FREE, THYROIDAB in the last 72 hours. Anemia Panel: No results for input(s): VITAMINB12, FOLATE, FERRITIN, TIBC, IRON, RETICCTPCT in the last 72 hours. Sepsis Labs: No results for input(s): PROCALCITON, LATICACIDVEN in the last 168 hours.   Recent Results (from the past 240 hour(s))  Resp Panel by RT-PCR (Flu A&B, Covid) Nasopharyngeal Swab     Status: None   Collection Time: 11/30/21  1:29 PM   Specimen: Nasopharyngeal Swab; Nasopharyngeal(NP) swabs in vial transport medium  Result Value Ref Range Status   SARS Coronavirus 2 by RT PCR NEGATIVE NEGATIVE Final    Comment: (NOTE) SARS-CoV-2 target nucleic acids are NOT DETECTED.  The SARS-CoV-2 RNA is generally detectable in upper  respiratory specimens during the acute phase of infection. The lowest concentration of SARS-CoV-2 viral copies this assay can detect is 138 copies/mL. A negative result does not preclude SARS-Cov-2 infection  and should not be used as the sole basis for treatment or other patient management decisions. A negative result may occur with  improper specimen collection/handling, submission of specimen other than nasopharyngeal swab, presence of viral mutation(s) within the areas targeted by this assay, and inadequate number of viral copies(<138 copies/mL). A negative result must be combined with clinical observations, patient history, and epidemiological information. The expected result is Negative.  Fact Sheet for Patients:  EntrepreneurPulse.com.au  Fact Sheet for Healthcare Providers:  IncredibleEmployment.be  This test is no t yet approved or cleared by the [place] FDA and  has been authorized for detection and/or diagnosis of SARS-CoV-2 by FDA under an Emergency Use Authorization (EUA). This EUA will remain  in effect (meaning this test can be used) for the duration of the COVID-19 declaration under Section 564(b)(1) of the Act, 21 U.S.C.section 360bbb-3(b)(1), unless the authorization is terminated  or revoked sooner.       Influenza A by PCR NEGATIVE NEGATIVE Final   Influenza B by PCR NEGATIVE NEGATIVE Final    Comment: (NOTE) The Xpert Xpress SARS-CoV-2/FLU/RSV plus assay is intended as an aid in the diagnosis of influenza from Nasopharyngeal swab specimens and should not be used as a sole basis for treatment. Nasal washings and aspirates are unacceptable for Xpert Xpress SARS-CoV-2/FLU/RSV testing.  Fact Sheet for Patients: EntrepreneurPulse.com.au  Fact Sheet for Healthcare Providers: IncredibleEmployment.be  This test is not yet approved or cleared by the [place] FDA and has been  authorized for detection and/or diagnosis of SARS-CoV-2 by FDA under an Emergency Use Authorization (EUA). This EUA will remain in effect (meaning this test can be used) for the duration of the COVID-19 declaration under Section 564(b)(1) of the Act, 21 U.S.C. section 360bbb-3(b)(1), unless the authorization is terminated or revoked.  Performed at [facility], [place] [address], [place], [place] [address]           Radiology Studies: No results found.      Scheduled Meds:  vitamin C  250 mg Per Tube Daily   brinzolamide  1 drop Both Eyes TID   chlorhexidine  15 mL Mouth Rinse BID   donepezil  10 mg Per Tube QHS   enoxaparin (LOVENOX) injection  40 mg Subcutaneous Q24H   feeding supplement (PROSource TF)  45 mL Per Tube Daily   free water  150 mL Per Tube Q6H   guaiFENesin  10 mL Per Tube Q6H   latanoprost  1 drop Both Eyes QHS   levETIRAcetam  500 mg Per Tube BID   mouth rinse  15 mL Mouth Rinse BID   memantine  10 mg Per Tube QHS   multivitamin  15 mL Per Tube Daily   Netarsudil Dimesylate  1 drop Both Eyes QHS   valproic acid  1,000 mg Per Tube Daily   valproic acid  500 mg Per Tube QHS   zinc oxide   Topical BID   Continuous Infusions:  feeding supplement (JEVITY 1.2 CAL) 1,000 mL (12/02/21 2225)     LOS: 14 days          Alma Friendly, MD [facility] P12/08/2021, 7:06 PM

## 2021-12-04 LAB — BASIC METABOLIC PANEL
Anion gap: 8 (ref 5–15)
BUN: 17 mg/dL (ref 8–23)
CO2: 29 mmol/L (ref 22–32)
Calcium: 9.1 mg/dL (ref 8.9–10.3)
Chloride: 100 mmol/L (ref 98–111)
Creatinine, Ser: 0.44 mg/dL — ABNORMAL LOW (ref 0.61–1.24)
GFR, Estimated: >60 mL/min (ref >60)
Glucose, Bld: 114 mg/dL — ABNORMAL HIGH (ref 70–99)
Potassium: 4.3 mmol/L (ref 3.5–5.1)
Sodium: 137 mmol/L (ref 135–145)

## 2021-12-04 LAB — GLUCOSE, CAPILLARY
Glucose-Capillary: 105 mg/dL — ABNORMAL HIGH (ref 70–99)
Glucose-Capillary: 113 mg/dL — ABNORMAL HIGH (ref 70–99)
Glucose-Capillary: 115 mg/dL — ABNORMAL HIGH (ref 70–99)
Glucose-Capillary: 119 mg/dL — ABNORMAL HIGH (ref 70–99)
Glucose-Capillary: 94 mg/dL (ref 70–99)
Glucose-Capillary: 97 mg/dL (ref 70–99)

## 2021-12-04 LAB — CBC
HCT: 39.3 % (ref 39.0–52.0)
Hemoglobin: 12.3 g/dL — ABNORMAL LOW (ref 13.0–17.0)
MCH: 26.5 pg (ref 26.0–34.0)
MCHC: 31.3 g/dL (ref 30.0–36.0)
MCV: 84.5 fL (ref 80.0–100.0)
Platelets: 432 10*3/uL — ABNORMAL HIGH (ref 150–400)
RBC: 4.65 MIL/uL (ref 4.22–5.81)
RDW: 14.1 % (ref 11.5–15.5)
WBC: 4 10*3/uL (ref 4.0–10.5)
nRBC: 0 % (ref 0.0–0.2)

## 2021-12-04 NOTE — Progress Notes (Signed)
Report called to [facility], awaiting [facility] transport.

## 2021-12-04 NOTE — Plan of Care (Signed)

## 2021-12-04 NOTE — TOC Progression Note (Signed)
Transition of Care [facility]) - Progression Note    Patient Details  Name: [name] [name] MRN: [id number] Date of Birth: 1935/09/23  [facility] CM/SW Contact  Darleene Cleaver, [place] Phone Number: 12/04/2021, 12:39 PM  Clinical Narrative:      CSW contacted Delorise Shiner at [facility] to see if they spoke to patient's wife regarding patient coming to SNF.  Per Delorise Shiner at [facility], the wife needs to speak to business manager first before they can officially accept patient.  CSW checked on insurance authorization, and patient was approved for 5 days reference number [id number].  CSW updated [employer] waiting for a call back from her.  Expected Discharge Plan: Skilled Nursing Facility Barriers to Discharge: Continued Medical Work up  Expected Discharge Plan and Services Expected Discharge Plan: Skilled Nursing Facility         Expected Discharge Date: 12/04/21                                     Social Determinants of Health (SDOH) Interventions    Readmission Risk Interventions Readmission Risk Prevention Plan 10/22/2021  Transportation Screening Complete  Medication Review [employer]) Referral to Pharmacy  PCP or Specialist appointment within 3-5 days of discharge Complete  HRI or Home Care Consult Not Complete  HRI or Home Care Consult Pt Refusal Comments from SNF  SW Recovery Care/Counseling Consult Complete  Palliative Care Screening Not Applicable  Skilled Nursing Facility Complete  Some recent data might be hidden

## 2021-12-04 NOTE — Discharge Summary (Addendum)
Discharge Summary  [name] [name] NZV:[id number] DOB: 02/27/1935  PCP: Raymondo Band, MD  Admit date: 11/18/2021 Discharge date: 12/04/2021  Time spent: 35 minutes  Recommendations for Outpatient Follow-up:  Follow-up with your PCP Take your medications as prescribed.  Discharge Diagnoses:  Active Hospital Problems   Diagnosis Date Noted   Acute encephalopathy 61/53/7943   Acute metabolic encephalopathy 27/61/4709   Recurrent pneumonia 10/20/2021   Pressure injury of skin 05/11/2021   Seizure ([place]) 03/09/2021   Dementia ([place]) 02/25/2021   Essential hypertension 07/11/2007    Resolved Hospital Problems  No resolved problems to display.    Discharge Condition: Stable.   Diet recommendation:   Nutrition Follow-up:  Continuous Tube Feeding Recommendations by Registered Dietitian:   INTERVENTION:  - continue evity 1.2 @ 70 ml/hr with 45 ml Prosource TF once/day and 150 ml free water QID.     NUTRITION DIAGNOSIS:    Inadequate oral intake related to inability to eat as evidenced by NPO status. -ongoing   GOAL:    Patient will meet greater than or equal to 90% of their needs -met with TF regimen   MONITOR:    TF tolerance, Diet advancement, Labs, Weight trends, Skin     ASSESSMENT:    86 y.o. male with known history of dementia, seizures, BPH and decubitus ulcers and pericardial effusion anemia was recently admitted for pneumonia discharged about 2 weeks ago was brought to the ER after patient was found to be increasingly confused and lethargic.   Patient remains NPO. PEG placed on 11/26/21. He is receiving TF at goal regimen: Jevity 1.2 @ 70 ml/hr with 45 ml Prosource TF once/day and 150 ml free water QID. This regimen is providing 2056 kcal, 104 grams protein, and 1956 ml free water.    Weight has been stable throughout hospitalization. Non-pitting edema to BUE documented in the edema section of flow sheet.    Medications reviewed; 15 ml liquid  multivitamin/day.     Diet Order:   Diet Order                  Diet NPO time specified  Diet effective now                         EDUCATION NEEDS:    No education needs have been identified at this time   Skin:  Skin Assessment: Skin Integrity Issues: Skin Integrity Issues:: DTI, Other (Comment) DTI: bilateral heels Other: skin tears to bilateral pre-tibial area, sacrum, and L thigh   Height:       Ht Readings from Last 1 Encounters:  11/18/21 $RemoveB'6\' 1"'TgEkiuIy$  (1.854 m)      Weight:       Wt Readings from Last 1 Encounters:  11/30/21 80.2 kg      Estimated Nutritional Needs:  Kcal:  2000-2200 kcal Protein:  100-115 grams Fluid:  >/= 2 L/day  ------------------------------------------------------------------   Bolus tube feeding recommendations by Registered Dietitian: - 355 ml (1.5 cartons) Jevity 1.5 cal formula QID via PEG at 0800, 1200, 1600, and 2000 (total of 6 cartons daily) - ProSource TF 45 ml daily - Free water flushes of 150 ml q 4 hours   Recommended bolus tube feeding regimen with free water flushes would provide 2170 kcal, 102 grams of protein, and 1980 ml of H2O.        Vitals:   12/04/21 0512 12/04/21 1238  BP: (!) 158/91 (!) 166/98  Pulse: 81  67  Resp: 20 20  Temp: 97.9 F (36.6 C) (!) 97.4 F (36.3 C)  SpO2: 100%     History of present illness:   Patient is a 86 year old male with history of seizure, Advanced dementia ,nursing home resident who was discharged from here  2 weeks ago for COVID-pneumonia, found to have lethargy per nursing staff patient is normally talkative, aaox1, will even sing to the snf RN, but he was noticed to have increased crackles and rhonchi required suctioning. He was suspected to have recurrent aspiration pneumonia. After discussion with family, we decided to go ahead and put PEG tube.  IR performed PEG placement on 11/26/2021.  Patient is medically stable for discharge to skilled nursing facility as soon as bed  is available.  12/04/2021: Patient seen at his bedside.  No acute events overnight.  He has no new complaints.  Hospital Course:  Principal Problem:   Acute encephalopathy Active Problems:   Essential hypertension   Dementia (HCC)   Seizure ([facility])   Pressure injury of skin   Recurrent pneumonia   Acute metabolic encephalopathy  Acute metabolic encephalopathy, Possibly from recurrent aspiration pneumonia CT head no acute findings, ammonia level unremarkable, valproic acid level below low therapeutic Completed antibiotics Blood culture no growth, urine culture no growth, COVID screening negative Mental status has improved after risperidone has been discontinued   Left Pleural effusion Monitor, small to moderate as per CT.  Given a dose of  IV Lasix x1, echo showed diastolic dysfunction Since respiratory status is stable, we will not do thoracentesis at this time    Right upper extremity edema venous Doppler negative for  DVT Elevate right arm   History of seizure On Depakote and Keppra EEG :no seizure    Left hip pain Pelvic x-ray concerning for nondisplaced left femur fracture, recommend CT for further evaluation , CT did not reveal fracture   BPH Oral meds held, cant be given through tube    FTT/Dysphagia/high risk for aspiration Frequent falls in the past when he was at home, he was discharge to snf on 11/11, he is not ambulatory.  Now has significantly declined.   He has high risks of aspiration in the future.  Underwent PEG tube placement on 11/26/2021.  Plan is to discharge him back to skilled nursing facility    Advanced dementia/from nursing home: Continue supportive care. Delirium  precautions.  On risperidone at bedtime, discontinued due to excessive sleepiness.  Plan is to discharge to SNF.  His previous facility is not accepting him anymore because he has a balance to be paid.  Difficult placement due to patient being self-pay as has used up Medicare days    Pressure Injury 05/08/21 Coccyx Mid Stage 1 -  Intact skin with non-blanchable redness of a localized area usually over a bony prominence. (Active)  05/08/21 0515  Location: Coccyx  Location Orientation: Mid  Staging: Stage 1 -  Intact skin with non-blanchable redness of a localized area usually over a bony prominence.  Wound Description (Comments):   Present on Admission: Yes     Pressure Injury 10/25/21 Heel Left Deep Tissue Pressure Injury - Purple or maroon localized area of discolored intact skin or blood-filled blister due to damage of underlying soft tissue from pressure and/or shear. purple wound on heel (Active)  10/25/21 0800  Location: Heel  Location Orientation: Left  Staging: Deep Tissue Pressure Injury - Purple or maroon localized area of discolored intact skin or blood-filled blister due to damage of  underlying soft tissue from pressure and/or shear.  Wound Description (Comments): purple wound on heel  Present on Admission:      Pressure Injury 10/25/21 Heel Right Deep Tissue Pressure Injury - Purple or maroon localized area of discolored intact skin or blood-filled blister due to damage of underlying soft tissue from pressure and/or shear. purple wound on heel (Active)  10/25/21 0800  Location: Heel  Location Orientation: Right  Staging: Deep Tissue Pressure Injury - Purple or maroon localized area of discolored intact skin or blood-filled blister due to damage of underlying soft tissue from pressure and/or shear.  Wound Description (Comments): purple wound on heel  Present on Admission:      Pressure Injury 11/18/21 Sacrum (Active)  11/18/21 2357  Location: Sacrum  Location Orientation:   Staging:   Wound Description (Comments):   Present on Admission: Yes            Nutrition Problem: Inadequate oral intake Etiology: inability to eat       DVT prophylaxis:Lovenox Code Status: Full Family Communication:: None at bedside Patient status:   Dispo: The  patient is from: SNF              Anticipated d/c is to: SNF/long term care              Anticipated d/c date is: Medically stable for discharge   Consultants: Palliative care   Procedures: PEG tube placement   Antimicrobials:  Anti-infectives (From admission, onward)        Start     Dose/Rate Route Frequency Ordered Stop    11/26/21 1000   ceFAZolin (ANCEF) IVPB 2g/100 mL premix        2 g 200 mL/hr over 30 Minutes Intravenous To Radiology 11/25/21 1643 11/26/21 1557    11/26/21 0700   ceFAZolin (ANCEF) IVPB 2g/100 mL premix  Status:  Discontinued        2 g 200 mL/hr over 30 Minutes Intravenous To Radiology 11/24/21 1639 11/24/21 1640    11/25/21 1115   ceFAZolin (ANCEF) IVPB 2g/100 mL premix  Status:  Discontinued        2 g 200 mL/hr over 30 Minutes Intravenous To Radiology 11/25/21 1026 11/25/21 1643    11/22/21 1400   amoxicillin-clavulanate (AUGMENTIN) 250-62.5 MG/5ML suspension 500 mg        500 mg Per Tube Every 8 hours 11/22/21 1037 11/25/21 2216    11/19/21 1800   Ampicillin-Sulbactam (UNASYN) 3 g in sodium chloride 0.9 % 100 mL IVPB  Status:  Discontinued        3 g 200 mL/hr over 30 Minutes Intravenous Every 6 hours 11/19/21 1644 11/22/21 1036    11/19/21 0100   cefTRIAXone (ROCEPHIN) 2 g in sodium chloride 0.9 % 100 mL IVPB  Status:  Discontinued        2 g 200 mL/hr over 30 Minutes Intravenous Every 24 hours 11/18/21 2358 11/19/21 1621    11/19/21 0100   azithromycin (ZITHROMAX) 500 mg in sodium chloride 0.9 % 250 mL IVPB  Status:  Discontinued        500 mg 250 mL/hr over 60 Minutes Intravenous Every 24 hours 11/18/21 2358 11/19/21 1621    11/18/21 2145   vancomycin (VANCOREADY) IVPB 1500 mg/300 mL  Status:  Discontinued        1,500 mg 150 mL/hr over 120 Minutes Intravenous  Once 11/18/21 2137 11/19/21 0023    11/18/21 2145   ceFEPIme (MAXIPIME) 2 g in sodium chloride  0.9 % 100 mL IVPB  Status:  Discontinued        2 g 200 mL/hr over 30 Minutes Intravenous   Once 11/18/21 2138 11/19/21 0007    11/18/21 2130   ceFEPIme (MAXIPIME) 1 g in sodium chloride 0.9 % 100 mL IVPB  Status:  Discontinued        1 g 200 mL/hr over 30 Minutes Intravenous  Once 11/18/21 2125 11/18/21 2138             Discharge Exam: BP (!) 166/98 (BP Location: Left Arm)   Pulse 67   Temp (!) 97.4 F (36.3 C) (Oral)   Resp 20   Ht $R'6\' 1"'kN$  (1.854 m)   Wt 80 kg   SpO2 100%   BMI 23.27 kg/m  General: 86 y.o. year-old male well developed well nourished in no acute distress.  Alert and minimally interactive. Cardiovascular: Regular rate and rhythm with no rubs or gallops.  No thyromegaly or JVD noted.   Respiratory: Clear to auscultation with no wheezes or rales. Good inspiratory effort. Abdomen: Soft nontender nondistended with normal bowel sounds x4 quadrants. Musculoskeletal: No lower extremity edema.  Psychiatry: Mood is appropriate for condition and setting  Discharge Instructions You were cared for by a hospitalist during your hospital stay. If you have any questions about your discharge medications or the care you received while you were in the hospital after you are discharged, you can call the unit and asked to speak with the hospitalist on call if the hospitalist that took care of you is not available. Once you are discharged, your primary care physician will handle any further medical issues. Please note that NO REFILLS for any discharge medications will be authorized once you are discharged, as it is imperative that you return to your primary care physician (or establish a relationship with a primary care physician if you do not have one) for your aftercare needs so that they can reassess your need for medications and monitor your lab values.  Discharge Instructions     Diet general   Complete by: As directed    Tube feeding   Discharge instructions   Complete by: As directed    1)Do CBC and BMP tests in a week   Discharge wound care:   Complete by: As  directed    As per wound care   Increase activity slowly   Complete by: As directed       Allergies as of 12/04/2021       Reactions   Chocolate    Itching if he eats too much chocolate    Ciprofloxacin    Achilles tendon pain    Lipitor [atorvastatin]    Muscle aches         Medication List     STOP taking these medications    aluminum-magnesium hydroxide-simethicone 200-200-20 MG/5ML Susp Commonly known as: MAALOX   CO Q 10 PO   divalproex 500 MG DR tablet Commonly known as: DEPAKOTE   levETIRAcetam 500 MG tablet Commonly known as: KEPPRA Replaced by: levETIRAcetam 100 MG/ML solution   loperamide 2 MG capsule Commonly known as: IMODIUM   magnesium hydroxide 400 MG/5ML suspension Commonly known as: MILK OF MAGNESIA   MILK THISTLE PO   polyethylene glycol 17 g packet Commonly known as: MIRALAX / GLYCOLAX   promethazine 25 MG tablet Commonly known as: PHENERGAN   risperiDONE 0.5 MG tablet Commonly known as: RISPERDAL   senna 8.6 MG Tabs tablet Commonly known as:  SENOKOT   tamsulosin 0.4 MG Caps capsule Commonly known as: FLOMAX       TAKE these medications    acetaminophen 500 MG tablet Commonly known as: TYLENOL Place 1 tablet (500 mg total) into feeding tube every 8 (eight) hours as needed. What changed:  how to take this when to take this reasons to take this   Azopt 1 % ophthalmic suspension Generic drug: brinzolamide Place 1 drop into both eyes 3 (three) times daily. Notes to patient: 12/04/2021 bedtime    donepezil 10 MG tablet Commonly known as: ARICEPT Place 1 tablet (10 mg total) into feeding tube at bedtime. What changed: how to take this Notes to patient: 12/04/2021 bedtime    feeding supplement (JEVITY 1.2 CAL) Liqd Place 1,000 mLs into feeding tube continuous.   feeding supplement (PROSource TF) liquid Place 45 mLs into feeding tube daily. Notes to patient: 12/05/2021    fish oil-omega-3 fatty acids 1000 MG  capsule Place 1 capsule (1 g total) into feeding tube daily. What changed: how to take this Notes to patient: 12/05/2021    free water Soln Place 150 mLs into feeding tube every 6 (six) hours.   Glucosamine-Chondroitin 500-400 MG Caps 1 tablet by PEG Tube route daily. What changed: how to take this Notes to patient: 12/05/2021    guaiFENesin 100 MG/5ML liquid Commonly known as: ROBITUSSIN Place 15 mLs into feeding tube every 4 (four) hours as needed for cough or to loosen phlegm. What changed: how to take this Notes to patient: 12/04/2021 had at 12 p.m.    latanoprost 0.005 % ophthalmic solution Commonly known as: XALATAN Place 1 drop into both eyes at bedtime. Notes to patient: 12/04/2021 bedtime    levETIRAcetam 100 MG/ML solution Commonly known as: KEPPRA Place 5 mLs (500 mg total) into feeding tube 2 (two) times daily. Replaces: levETIRAcetam 500 MG tablet Notes to patient: 12/04/2021 bedtime    melatonin 5 MG Tabs Place 2 tablets (10 mg total) into feeding tube at bedtime. What changed: how to take this Notes to patient: 12/04/2021 bedtime    memantine 10 MG tablet Commonly known as: NAMENDA Place 1 tablet (10 mg total) into feeding tube at bedtime. What changed:  how much to take how to take this when to take this additional instructions Notes to patient: 12/04/2021 bedtime    multivitamin with minerals tablet Place 1 tablet into feeding tube daily. What changed: how to take this Notes to patient: 12/05/2021    Rhopressa 0.02 % Soln Generic drug: Netarsudil Dimesylate Place 1 drop into both eyes at bedtime. Notes to patient: 12/04/2021 bedtime    valproic acid 250 MG/5ML solution Commonly known as: DEPAKENE Place 10 mLs (500 mg total) into feeding tube at bedtime. Notes to patient: 12/04/2021 bedtime    valproic acid 250 MG/5ML solution Commonly known as: DEPAKENE Place 20 mLs (1,000 mg total) into feeding tube daily. Notes to patient: 12/05/2021     vitamin C 250 MG tablet Commonly known as: ASCORBIC ACID Place 1 tablet (250 mg total) into feeding tube daily. What changed: how to take this Notes to patient: 12/05/2021    zinc oxide 20 % ointment Apply topically 2 (two) times daily. Notes to patient: 12/04/2021 bedtime                Discharge Care Instructions  (From admission, onward)           Start     Ordered   11/30/21 0000  Discharge wound care:  Comments: As per wound care   11/30/21 1439           Allergies  Allergen Reactions   Chocolate     Itching if he eats too much chocolate    Ciprofloxacin     Achilles tendon pain    Lipitor [Atorvastatin]     Muscle aches     Contact information for follow-up providers     Raymondo Band, MD. Call today.   Specialty: Family Medicine Why: Please call for a posthospital follow-up appointment. Contact information: [place] [address] [phone]         Lelon Perla, MD .   Specialty: Cardiology Contact information: [address] [place] [address] [phone]              Contact information for after-discharge care     Destination     [facility] .   Service: Skilled Nursing Contact information: [address] [place] [phone]                      The results of significant diagnostics from this hospitalization (including imaging, microbiology, ancillary and laboratory) are listed below for reference.    Significant Diagnostic Studies: CT ABDOMEN PELVIS WO CONTRAST  Result Date: 11/23/2021 CLINICAL DATA:  Preoperative evaluation for gastric tube placement. EXAM: CT ABDOMEN AND PELVIS WITHOUT CONTRAST TECHNIQUE: Multidetector CT imaging of the abdomen and pelvis was performed following the standard protocol without IV contrast. COMPARISON:  08/20/2014.  05/25/2012. FINDINGS: Lower chest: Bilateral pleural  effusions larger on the left than the right with dependent atelectasis and or infiltrate, worse in the left lower lobe than the right lower lobe. Cardiomegaly. Small amount of pericardial fluid. Hepatobiliary: Liver appears normal without contrast. No calcified gallstones. Pancreas: Normal Spleen: Normal Adrenals/Urinary Tract: Adrenal glands are normal. Left kidney is normal. Right kidney shows a 17 mm calcified mass in the ventral upper pole. This is not enlarged and is actually slightly smaller than was seen in 2013 and 2015. This was felt previously to represent a benign calcified hemorrhagic or proteinaceous cyst, and the follow-up would be consistent with that diagnosis. No progressive or worrisome finding. Bladder is thick-walled. Stomach/Bowel: Nasogastric tube enters the stomach. Stomach appears normally positioned. There is no interposed colon presently between the body and antrum of the stomach and the anterior abdominal wall. The inferior aspect of the left lobe does overlie the lesser curvature region. No small bowel pathology is seen. Question the presence of a rectal mass. Digital examination recommended. Vascular/Lymphatic: Aortic atherosclerosis. No aneurysm. IVC is normal. No adenopathy. Reproductive: Normal except for enlarged prostate. Other: No free fluid or air. Small left inguinal hernia containing only fat. Musculoskeletal: This chronic degenerative changes affect the spine. IMPRESSION: Bilateral pleural effusions, larger on the left than the right, with atelectasis in both lower lobes, left worse than right. Cardiomegaly.  Pericardial effusion. No unexpected gastric anatomic findings. No interposed colon between the anterior wall of the body and antrum of the stomach and the anterior abdominal wall. Chronic 1.7 cm calcified region at the upper pole the right kidney, actually smaller than seen 79 years ago, consistent with a benign entity. See above discussion. Enlarged prostate. Question  rectal mass versus proctitis. Thick-walled bladder suggesting cystitis or chronic outlet obstruction. Small left inguinal hernia containing only fat. Electronically Signed   By: Nelson Chimes M.D.   On: 11/23/2021 19:08  DG Pelvis 1-2 Views  Result Date: 11/19/2021 CLINICAL DATA:  Unresponsiveness. EXAM: PELVIS - 1-2 VIEW COMPARISON:  None. FINDINGS: Evaluation is limited on the provided images. Faint linear subcapital lucency through the neck of the left femur concerning for a nondisplaced fracture. Dedicated left hip radiograph or CT is recommended for better evaluation. No dislocation. The bones are osteopenic. Degenerative changes of the lumbar spine. The soft tissues are unremarkable. IMPRESSION: Findings concerning for a nondisplaced left femoral neck fracture. Dedicated left hip radiograph or CT is recommended for better evaluation. Electronically Signed   By: Elgie Collard M.D.   On: 11/19/2021 03:41   DG Abd 1 View  Result Date: 11/21/2021 CLINICAL DATA:  NG placement. EXAM: ABDOMEN - 1 VIEW COMPARISON:  Earlier radiograph dated 11/19/2021. FINDINGS: Feeding tube with weighted tip in the upper abdomen likely in the proximal stomach. Small left and possibly trace right pleural effusion. There is bibasilar atelectasis or infiltrate. Bilateral hazy and interstitial densities may represent edema. Pneumonia is not excluded. There is cardiomegaly. No acute osseous pathology. IMPRESSION: Feeding tube with weighted tip in the proximal stomach. Electronically Signed   By: Elgie Collard M.D.   On: 11/21/2021 03:57   CT HEAD WO CONTRAST ( )  Result Date: 11/18/2021 CLINICAL DATA:  Delirium. EXAM: CT HEAD WITHOUT CONTRAST TECHNIQUE: Contiguous axial images were obtained from the base of the skull through the vertex without intravenous contrast. COMPARISON:  10/25/2021. FINDINGS: Brain: No acute intracranial hemorrhage, midline shift or mass effect. Generalized atrophy is noted. Subcortical and  periventricular white matter hypodensities are seen bilaterally. There is no hydrocephalus. Vascular: No hyperdense vessel or unexpected calcification. Skull: Normal. Negative for fracture or focal lesion. Sinuses/Orbits: Round density is present in the left maxillary sinus, possible mucosal retention cyst or polyp. There is partial opacification of the ethmoid air cells bilaterally. The orbits are stable. Other: None. IMPRESSION: Stable head CT with no acute intracranial process. Electronically Signed   By: Thornell Sartorius M.D.   On: 11/18/2021 20:48   CT Chest Wo Contrast  Result Date: 11/18/2021 CLINICAL DATA:  Chest pain, shortness of breath. Altered mental status. Recent pneumonia. Recent COVID positive. EXAM: CT CHEST WITHOUT CONTRAST TECHNIQUE: Multidetector CT imaging of the chest was performed following the standard protocol without IV contrast. COMPARISON:  None. FINDINGS: Cardiovascular: Mild cardiomegaly. Small pericardial effusion is present. Calcified plaque over the left anterior descending and lateral circumflex coronary arteries. No evidence of thoracic aortic aneurysm. Calcified plaque is present over the thoracic aorta. Remaining vascular structures are unremarkable. Mediastinum/Nodes: No definite mediastinal or hilar adenopathy. Remaining mediastinal structures are unremarkable. Lungs/Pleura: Lungs are adequately inflated and demonstrate patchy hazy opacification over the mid to lower lungs bilaterally. Consolidation over the left base likely compressive atelectasis with small to moderate left effusion. Airways are normal. Upper Abdomen: Minimal calcified plaque over the abdominal aorta. Oval partially calcified rounded mass over the upper pole right kidney slightly smaller compared to 2015. No acute findings. Musculoskeletal: Degenerative change of the spine. No focal abnormality. IMPRESSION: 1. Patchy bilateral hazy airspace process which may be due to infection bacterial or viral origin.  Small to moderate left pleural effusion with associated left basilar consolidation likely compressive atelectasis. 2. Mild cardiomegaly with small pericardial effusion. Atherosclerotic coronary artery disease. 3. Aortic atherosclerosis. 4. Oval partially calcified rounded mass over the upper pole right kidney slightly smaller compared to 2015. Aortic Atherosclerosis (ICD10-I70.0). Electronically Signed   By: Elberta Fortis M.D.   On: 11/18/2021 20:52  CT PELVIS WO CONTRAST  Result Date: 11/19/2021 CLINICAL DATA:  Left femur fracture. EXAM: CT PELVIS WITHOUT CONTRAST TECHNIQUE: Multidetector CT imaging of the pelvis was performed following the standard protocol without intravenous contrast. COMPARISON:  Radiograph of same day. FINDINGS: Urinary Tract:  No abnormality visualized. Bowel: Rectal wall thickening is noted with surrounding inflammatory changes concerning for proctitis. Vascular/Lymphatic: Atherosclerosis of visualized portion of abdominal aorta is noted. No adenopathy is noted. Reproductive:  Mild prostatic enlargement is noted. Other:  No definite ascites or hernia is noted. Musculoskeletal: No definite fracture is noted. IMPRESSION: No definite fracture or other significant bony abnormality is noted. Rectal wall thickening is noted with surrounding inflammatory changes concerning for proctitis. Aortic Atherosclerosis (ICD10-I70.0). Electronically Signed   By: Marijo Conception M.D.   On: 11/19/2021 13:32   IR GASTROSTOMY TUBE MOD SED  Result Date: 11/26/2021 INDICATION: 86 year old gentleman with dementia, pneumonia, dysphagia requires feeding tube access. EXAM: Percutaneous gastrostomy tube placement MEDICATIONS: Ancef 2 g IV; Antibiotics were administered within 1 hour of the procedure. Glucagon 1 mg IV ANESTHESIA/SEDATION: Moderate (conscious) sedation was employed during this procedure. A total of Versed 1 mg and Fentanyl 50 mcg was administered intravenously by the radiology nurse. Total  intra-service moderate Sedation Time: 16 minutes. The patient's level of consciousness and vital signs were monitored continuously by radiology nursing throughout the procedure under my direct supervision. CONTRAST:  15 mL of Omnipaque 300-administered into the gastric lumen. FLUOROSCOPY TIME:  Fluoroscopy Time: 1 minutes 42 seconds (8 mGy). COMPLICATIONS: None immediate. PROCEDURE: Informed written consent was obtained from the patient after a thorough discussion of the procedural risks, benefits and alternatives. All questions were addressed. Maximal Sterile Barrier Technique was utilized including caps, mask, sterile gowns, sterile gloves, sterile drape, hand hygiene and skin antiseptic. A timeout was performed prior to the initiation of the procedure. The left hepatic lobe margin was marked utilizing ultrasound guidance. The epigastric region was prepped and draped in the usual sterile fashion. The stomach was insufflated utilizing the NG tube. Following local lidocaine administration, 2 gastropexies were placed to secure the anterior wall of the stomach to the anterior abdominal wall. Percutaneous access obtained into the gastric antrum at the center of the gastropexies with an 18 gauge needle. Guide wire advanced into the gastric lumen. Serial dilation was performed and peel-away sheath was placed. 35 French gastrostomy tube inserted over the guidewire into the gastric lumen. The G tube retention balloon was inflated with 10 mL of dilute contrast and retracted to the anterior gastric wall. Contrast administrated through the gastrostomy tube opacified the gastric lumen. The insertion site was covered with sterile dressing. IMPRESSION: 70 French gastrostomy tube placement as above. Electronically Signed   By: Miachel Roux M.D.   On: 11/26/2021 16:12   CT FEMUR LEFT WO CONTRAST  Result Date: 11/19/2021 CLINICAL DATA:  Suspected femoral fracture EXAM: CT OF THE LOWER LEFT EXTREMITY WITHOUT CONTRAST TECHNIQUE:  Multidetector CT imaging of the lower left extremity was performed according to the standard protocol. COMPARISON:  Pelvic x-ray earlier the same day. FINDINGS: Bones/Joint/Cartilage No acute fracture identified, specifically in the subcapital region of the femur which was suspected on plain film. Mild degenerative changes of the hip and moderate degenerative changes of the knee noted. Chondrocalcinosis noted in the knee medial and lateral compartments. Ligaments Suboptimally assessed by CT. Muscles and Tendons Calcific densities in the proximal hamstring tendons suggestive of chronic tendinosis. No obvious significant muscle abnormality identified. Soft tissues Mild subcutaneous fat stranding  edema in the thigh. Arterial vascular calcifications noted. IMPRESSION: No acute fracture or dislocation identified. Other chronic findings as described. Electronically Signed   By: Ofilia Neas M.D.   On: 11/19/2021 13:39   DG Chest Port 1 View  Result Date: 11/18/2021 CLINICAL DATA:  Possible sepsis. Altered mental status and shortness of breath. EXAM: PORTABLE CHEST 1 VIEW COMPARISON:  10/25/2021 FINDINGS: Lungs are somewhat hypoinflated with continued hazy opacification over the lung bases left worse than right. Left effusion and atelectasis is likely. Infection in the lung bases is possible. Cardiomediastinal silhouette and remainder of the exam is unchanged. IMPRESSION: Stable bibasilar opacification left worse than right likely effusions with atelectasis. Infection in the lung bases is possible. Electronically Signed   By: Marin Olp M.D.   On: 11/18/2021 19:38   DG Abd Portable 1V  Result Date: 11/19/2021 CLINICAL DATA:  NG-tube. EXAM: PORTABLE ABDOMEN - 1 VIEW COMPARISON:  Abdominal x-ray 10/25/2021. FINDINGS: Enteric tube tip is at the level of the gastric antrum. Bowel-gas pattern is nonobstructive. There are small densities in the right upper quadrant, possibly related to bowel content. No acute  fractures are seen. IMPRESSION: Enteric tube tip is at the level of the gastric antrum. Electronically Signed   By: Ronney Asters M.D.   On: 11/19/2021 19:43   DG Swallowing Func-Speech Pathology  Result Date: 11/22/2021 Table formatting from the original result was not included. Objective Swallowing Evaluation: Type of Study: MBS-Modified Barium Swallow Study  Patient Details Name: [name] [name] MRN: [id number] Date of Birth: 1935/03/15 Today's Date: 11/22/2021 Time: SLP Start Time (ACUTE ONLY): 1345 -SLP Stop Time (ACUTE ONLY): 7622 SLP Time Calculation (min) (ACUTE ONLY): 20 min Past Medical History: Past Medical History: Diagnosis Date  Allergy   Dementia ([place])   sees Dr. Ellouise Newer   Diverticulosis of colon (without mention of hemorrhage) 03-01-2004, 04-04-2011  Colonoscopy  ED (erectile dysfunction)   Glaucoma   sees Dr. Crecencio Mc   Hyperglycemia   Hyperlipidemia   Hypertension   Internal hemorrhoids 03-15-1999  Flex Sig   Irritable bowel  Past Surgical History: Past Surgical History: Procedure Laterality Date  COLONOSCOPY  04-04-11  per Dr. Sharlett Iles, clear, no repeats needed  EYE SURGERY    bilateral cataract extraction per Dr Dolores Lory  INGUINAL HERNIA REPAIR    KNEE ARTHROSCOPY    right knee  NASAL SINUS SURGERY   HPI: EMERALD GEHRES is a 86 y.o. male with known history of dementia, seizures, BPH, decubitus ulcers, pericardial effusion anemia was recently admitted for pneumonia discharged about 2 weeks ago was brought to the ER after patient was found to be increasingly confused and lethargic. CT chest was showing bilateral infiltrates concerning for pneumonia also was showing mild pericardial effusion. Most recent MBS 10/28/21 recommending Dys 2/honey thick and pt was able to upgrade to Dys 2/nectar prior to discharge.  Subjective: lethargic but able to particiate in minimal amount of PO intake and follow basic directions  Recommendations for follow up therapy are one component of a multi-disciplinary  discharge planning process, led by the attending physician.  Recommendations may be updated based on patient status, additional functional criteria and insurance authorization. Assessment / Plan / Recommendation Clinical Impressions 11/22/2021 Clinical Impression Patient presents with a mild oral and a severe pharyngeal phase dysphagia which is significantly declined as compared to MBS documented from 3 weeks ago. NG feeding tube in place during today's study and was in place during prior MBS as well. During today's MBS,  patient was lethargic and required cues to maintain alertness. He was able to follow basic directions to swallow again, cough. First PO administered was puree texture and majority of this bolus become retained in vallecular and pyriform sinus. He exhibited one instance of sensed aspiration (PAS 7) of moderate amount of puree which occured after the swallow and was from pyriform sinus residuals. Several other instances of aspiration occured, all of which were during or after swallows and all were silent. (PAS 8). SLP then gave patient spoon sip of nectar thick liquids, followed by spoon sip of thin liquid barium, both of which did not aid in reducing vallecular and sinus residuals. Very small amount of barium contrast would transit through UES with each swallow. Patient was able to cough and transit penetrate, aspirate out of laryngeal vestibule, but not consistently. There was a mild reduction in amount of vallecular and pyriform sinus residuals with multiple cued cough/throat clear and reswallows, dry swallows, however unable to achieve full clearance of pharyngeal residuals by end of study. SLP is recommending continue with NPO status and prognosis of PO advancement is guarded at this time. SLP Visit Diagnosis Dysphagia, oropharyngeal phase (R13.12) Attention and concentration deficit following -- Frontal lobe and executive function deficit following -- Impact on safety and function Severe  aspiration risk;Risk for inadequate nutrition/hydration   Treatment Recommendations 11/22/2021 Treatment Recommendations Therapy as outlined in treatment plan below   Prognosis 11/22/2021 Prognosis for Safe Diet Advancement Guarded Barriers to Reach Goals Severity of deficits;Cognitive deficits Barriers/Prognosis Comment -- Diet Recommendations 11/22/2021 SLP Diet Recommendations NPO Liquid Administration via -- Medication Administration Via alternative means Compensations -- Postural Changes --   Other Recommendations 11/22/2021 Recommended Consults -- Oral Care Recommendations Oral care QID;Staff/trained caregiver to provide oral care Other Recommendations -- Follow Up Recommendations Skilled nursing-short term rehab (<3 hours/day) Assistance recommended at discharge Frequent or constant Supervision/Assistance Functional Status Assessment Patient has had a recent decline in their functional status and/or demonstrates limited ability to make significant improvements in function in a reasonable and predictable amount of time Frequency and Duration  11/22/2021 Speech Therapy Frequency (ACUTE ONLY) min 1 x/week Treatment Duration 1 week   Oral Phase 11/22/2021 Oral Phase Impaired Oral - Pudding Teaspoon -- Oral - Pudding Cup -- Oral - Honey Teaspoon -- Oral - Honey Cup NT Oral - Nectar Teaspoon Delayed oral transit;Reduced posterior propulsion Oral - Nectar Cup NT Oral - Nectar Straw NT Oral - Thin Teaspoon Delayed oral transit;Reduced posterior propulsion Oral - Thin Cup NT Oral - Thin Straw NT Oral - Puree Delayed oral transit Oral - Mech Soft -- Oral - Regular NT Oral - Multi-Consistency -- Oral - Pill NT Oral Phase - Comment --  Pharyngeal Phase 11/22/2021 Pharyngeal Phase Impaired Pharyngeal- Pudding Teaspoon -- Pharyngeal -- Pharyngeal- Pudding Cup -- Pharyngeal -- Pharyngeal- Honey Teaspoon NT Pharyngeal -- Pharyngeal- Honey Cup NT Pharyngeal -- Pharyngeal- Nectar Teaspoon Reduced anterior laryngeal  mobility;Reduced airway/laryngeal closure;Penetration/Apiration after swallow;Moderate aspiration;Pharyngeal residue - valleculae;Pharyngeal residue - pyriform Pharyngeal -- Pharyngeal- Nectar Cup NT Pharyngeal -- Pharyngeal- Nectar Straw -- Pharyngeal -- Pharyngeal- Thin Teaspoon Delayed swallow initiation-vallecula;Reduced anterior laryngeal mobility;Reduced airway/laryngeal closure;Moderate aspiration;Penetration/Apiration after swallow;Penetration/Aspiration during swallow;Pharyngeal residue - valleculae;Pharyngeal residue - pyriform Pharyngeal -- Pharyngeal- Thin Cup NT Pharyngeal -- Pharyngeal- Thin Straw NT Pharyngeal -- Pharyngeal- Puree Delayed swallow initiation-vallecula;Penetration/Apiration after swallow;Moderate aspiration;Pharyngeal residue - pyriform;Pharyngeal residue - valleculae;Reduced anterior laryngeal mobility;Reduced airway/laryngeal closure Pharyngeal -- Pharyngeal- Mechanical Soft -- Pharyngeal -- Pharyngeal- Regular NT Pharyngeal -- Pharyngeal- Multi-consistency -- Pharyngeal --  Pharyngeal- Pill NT Pharyngeal -- Pharyngeal Comment --  Cervical Esophageal Phase  11/22/2021 Cervical Esophageal Phase Impaired Pudding Teaspoon -- Pudding Cup -- Honey Teaspoon -- Honey Cup -- Nectar Teaspoon Reduced cricopharyngeal relaxation Nectar Cup -- Nectar Straw -- Thin Teaspoon Reduced cricopharyngeal relaxation Thin Cup Reduced cricopharyngeal relaxation Thin Straw -- Puree Reduced cricopharyngeal relaxation Mechanical Soft -- Regular -- Multi-consistency -- Pill -- Cervical Esophageal Comment -- Sonia Baller, MA, CCC-SLP Speech Therapy                     EEG adult  Result Date: 11/19/2021 Lora Havens, MD     11/19/2021 12:57 PM Patient Name: [name] [name] MRN: [id number] Epilepsy Attending: Lora Havens Referring Physician/Provider: Dr Florencia Reasons Date: 11/19/2021 Duration: 22.41 mins Patient history: 86 year old male with altered mental status.  EEG to evaluate for seizure. Level of  alertness: Awake, asleep AEDs during EEG study: LEV Technical aspects: This EEG study was done with scalp electrodes positioned according to the 10-20 International system of electrode placement. Electrical activity was acquired at a sampling rate of $Remov'500Hz'DARoui$  and reviewed with a high frequency filter of $RemoveB'70Hz'XpKWZMCS$  and a low frequency filter of $RemoveB'1Hz'dKlwCNMA$ . EEG data were recorded continuously and digitally stored. Description: During awake state, no clear posterior dominant rhythm was seen.  Sleep was characterized by sleep spindles (12 to 14 Hz), maximal frontocentral region.  EEG showed continuous generalized predominantly 5 to 6 Hz theta as well as intermittent generalized 2 to 3 Hz delta slowing. Hyperventilation and photic stimulation were not performed.   ABNORMALITY - Continuous slow, generalized IMPRESSION: This study is suggestive of moderate diffuse encephalopathy, nonspecific etiology. No seizures or epileptiform discharges were seen throughout the recording. Lora Havens   ECHOCARDIOGRAM COMPLETE  Result Date: 11/19/2021    ECHOCARDIOGRAM REPORT   Patient Name:   [name] [name] Date of Exam: 11/19/2021 Medical Rec #:  [id number]        Height:       73.0 in Accession #:    [id number]       Weight:       166.0 lb Date of Birth:  January 16, 1935        BSA:          1.988 m Patient Age:    77 years         BP:           130/69 mmHg Patient Gender: M                HR:           67 bpm. Exam Location:  Inpatient Procedure: 2D Echo, Cardiac Doppler and Color Doppler Indications:    Pericardial effusion I31.3  History:        Patient has prior history of Echocardiogram examinations, most                 recent 02/09/2021. Arrythmias:RBBB, Signs/Symptoms:Dementia; Risk                 Factors:Hypertension and Dyslipidemia. Recurrent pneumonia.                 History of pericardial effusion.  Sonographer:    Darlina Sicilian RDCS Referring Phys: [place]  1. Left ventricular ejection fraction, by  estimation, is 60 to 65%. The left ventricle has normal function. The left ventricle has no regional wall motion abnormalities. Left ventricular diastolic parameters are consistent with  Grade II diastolic dysfunction (pseudonormalization). Elevated left ventricular end-diastolic pressure.  2. Right ventricular systolic function is normal. The right ventricular size is normal. There is normal pulmonary artery systolic pressure.  3. The pericardial effusion is posterior to the left ventricle and lateral to the left ventricle.  4. The mitral valve is abnormal. Trivial mitral valve regurgitation. No evidence of mitral stenosis.  5. The aortic valve is normal in structure. There is mild calcification of the aortic valve. Aortic valve regurgitation is mild to moderate. Aortic valve sclerosis/calcification is present, without any evidence of aortic stenosis.  6. Aortic dilatation noted. There is mild dilatation of the aortic root, measuring 39 mm. There is mild dilatation of the ascending aorta, measuring 41 mm.  7. The inferior vena cava is normal in size with greater than 50% respiratory variability, suggesting right atrial pressure of 3 mmHg. FINDINGS  Left Ventricle: Left ventricular ejection fraction, by estimation, is 60 to 65%. The left ventricle has normal function. The left ventricle has no regional wall motion abnormalities. The left ventricular internal cavity size was normal in size. There is  no left ventricular hypertrophy. Left ventricular diastolic parameters are consistent with Grade II diastolic dysfunction (pseudonormalization). Elevated left ventricular end-diastolic pressure. Right Ventricle: The right ventricular size is normal. No increase in right ventricular wall thickness. Right ventricular systolic function is normal. There is normal pulmonary artery systolic pressure. The tricuspid regurgitant velocity is 2.28 m/s, and  with an assumed right atrial pressure of 3 mmHg, the estimated right  ventricular systolic pressure is 23.8 mmHg. Left Atrium: Left atrial size was normal in size. Right Atrium: Right atrial size was normal in size. Pericardium: Trivial pericardial effusion is present. The pericardial effusion is posterior to the left ventricle and lateral to the left ventricle. Mitral Valve: The mitral valve is abnormal. There is mild thickening of the mitral valve leaflet(s). Trivial mitral valve regurgitation. No evidence of mitral valve stenosis. Tricuspid Valve: The tricuspid valve is normal in structure. Tricuspid valve regurgitation is mild . No evidence of tricuspid stenosis. Aortic Valve: The aortic valve is normal in structure. There is mild calcification of the aortic valve. Aortic valve regurgitation is mild to moderate. Aortic regurgitation PHT measures 690 msec. Aortic valve sclerosis/calcification is present, without any evidence of aortic stenosis. Pulmonic Valve: The pulmonic valve was normal in structure. Pulmonic valve regurgitation is mild. No evidence of pulmonic stenosis. Aorta: The aortic root is normal in size and structure and aortic dilatation noted. There is mild dilatation of the aortic root, measuring 39 mm. There is mild dilatation of the ascending aorta, measuring 41 mm. Venous: The inferior vena cava is normal in size with greater than 50% respiratory variability, suggesting right atrial pressure of 3 mmHg. IAS/Shunts: No atrial level shunt detected by color flow Doppler.  LEFT VENTRICLE PLAX 2D LVIDd:         5.40 cm   Diastology LVIDs:         3.10 cm   LV e' medial:    3.73 cm/s LV PW:         0.80 cm   LV E/e' medial:  18.3 LV IVS:        1.10 cm   LV e' lateral:   3.89 cm/s LVOT diam:     2.30 cm   LV E/e' lateral: 17.5 LV SV:         64 LV SV Index:   32 LVOT Area:     4.15 cm  3D Volume EF:                          3D EF:        63 %                          LV EDV:       130 ml                          LV ESV:       49 ml                           LV SV:        81 ml RIGHT VENTRICLE RV S prime:     12.90 cm/s TAPSE (M-mode): 2.0 cm LEFT ATRIUM             Index        RIGHT ATRIUM           Index LA diam:        2.70 cm 1.36 cm/m   RA Area:     10.30 cm LA Vol (A2C):   33.0 ml 16.60 ml/m  RA Volume:   17.80 ml  8.95 ml/m LA Vol (A4C):   26.7 ml 13.43 ml/m LA Biplane Vol: 30.5 ml 15.34 ml/m  AORTIC VALVE LVOT Vmax:   82.90 cm/s LVOT Vmean:  58.800 cm/s LVOT VTI:    0.155 m AI PHT:      690 msec  AORTA Ao Root diam: 3.90 cm Ao STJ diam:  3.1 cm Ao Asc diam:  4.05 cm MITRAL VALVE               TRICUSPID VALVE MV Area (PHT): 2.91 cm    TR Peak grad:   20.8 mmHg MV Decel Time: 261 msec    TR Vmax:        228.00 cm/s MV E velocity: 68.10 cm/s MV A velocity: 74.10 cm/s  SHUNTS MV E/A ratio:  0.92        Systemic VTI:  0.16 m                            Systemic Diam: 2.30 cm Jenkins Rouge MD Electronically signed by Jenkins Rouge MD Signature Date/Time: 11/19/2021/11:10:26 AM    Final    VAS [place] UPPER EXTREMITY VENOUS DUPLEX  Result Date: 11/22/2021 UPPER VENOUS STUDY  Patient Name:  [name] [name]  Date of Exam:   11/21/2021 Medical Rec #: [id number]         Accession #:    [id number] Date of Birth: 08-Dec-1935         Patient Gender: M Patient Age:   31 years Exam Location:  [facility] Procedure:      VAS [place] UPPER EXTREMITY VENOUS DUPLEX Referring Phys: Annamaria Boots XU --------------------------------------------------------------------------------  Indications: Swelling Limitations: Patient immobilty. Comparison Study: No previous exams Performing Technologist: Jody Hill RVT, RDMS  Examination Guidelines: A complete evaluation includes B-mode imaging, spectral Doppler, color Doppler, and power Doppler as needed of all accessible portions of each vessel. Bilateral testing is considered an integral part of a complete examination. Limited examinations for reoccurring indications may be performed as noted.  Right Findings:  +----------+------------+---------+-----------+----------+-------+ RIGHT     CompressiblePhasicitySpontaneousPropertiesSummary +----------+------------+---------+-----------+----------+-------+ IJV  Full       Yes       Yes                      +----------+------------+---------+-----------+----------+-------+ Subclavian    Full       Yes       Yes                      +----------+------------+---------+-----------+----------+-------+ Axillary      Full       Yes       Yes                      +----------+------------+---------+-----------+----------+-------+ Brachial      Full                                          +----------+------------+---------+-----------+----------+-------+ Radial        Full                                          +----------+------------+---------+-----------+----------+-------+ Ulnar         Full                                          +----------+------------+---------+-----------+----------+-------+ Cephalic      Full                                          +----------+------------+---------+-----------+----------+-------+ Basilic       Full       Yes       Yes                      +----------+------------+---------+-----------+----------+-------+  Left Findings: +----------+------------+---------+-----------+----------+-------+ LEFT      CompressiblePhasicitySpontaneousPropertiesSummary +----------+------------+---------+-----------+----------+-------+ Subclavian    Full       Yes       Yes                      +----------+------------+---------+-----------+----------+-------+  Summary:  Right: No evidence of deep vein thrombosis in the upper extremity. No evidence of superficial vein thrombosis in the upper extremity.  Left: No evidence of thrombosis in the subclavian.  *See table(s) above for measurements and observations.  Diagnosing physician: Orlie Pollen Electronically signed by Orlie Pollen on 11/22/2021 at 10:23:16 AM.    Final     Microbiology: Recent Results (from the past 240 hour(s))  Resp Panel by RT-PCR (Flu A&B, Covid) Nasopharyngeal Swab     Status: None   Collection Time: 11/30/21  1:29 PM   Specimen: Nasopharyngeal Swab; Nasopharyngeal(NP) swabs in vial transport medium  Result Value Ref Range Status   SARS Coronavirus 2 by RT PCR NEGATIVE NEGATIVE Final    Comment: (NOTE) SARS-CoV-2 target nucleic acids are NOT DETECTED.  The SARS-CoV-2 RNA is generally detectable in upper respiratory specimens during the acute phase of infection. The lowest concentration of SARS-CoV-2 viral copies this assay can detect is 138 copies/mL. A negative result does not preclude SARS-Cov-2 infection and should not be used  as the sole basis for treatment or other patient management decisions. A negative result may occur with  improper specimen collection/handling, submission of specimen other than nasopharyngeal swab, presence of viral mutation(s) within the areas targeted by this assay, and inadequate number of viral copies(<138 copies/mL). A negative result must be combined with clinical observations, patient history, and epidemiological information. The expected result is Negative.  Fact Sheet for Patients:  EntrepreneurPulse.com.au  Fact Sheet for Healthcare Providers:  IncredibleEmployment.be  This test is no t yet approved or cleared by the [place] FDA and  has been authorized for detection and/or diagnosis of SARS-CoV-2 by FDA under an Emergency Use Authorization (EUA). This EUA will remain  in effect (meaning this test can be used) for the duration of the COVID-19 declaration under Section 564(b)(1) of the Act, 21 U.S.C.section 360bbb-3(b)(1), unless the authorization is terminated  or revoked sooner.       Influenza A by PCR NEGATIVE NEGATIVE Final   Influenza B by PCR NEGATIVE NEGATIVE Final    Comment: (NOTE) The  Xpert Xpress SARS-CoV-2/FLU/RSV plus assay is intended as an aid in the diagnosis of influenza from Nasopharyngeal swab specimens and should not be used as a sole basis for treatment. Nasal washings and aspirates are unacceptable for Xpert Xpress SARS-CoV-2/FLU/RSV testing.  Fact Sheet for Patients: EntrepreneurPulse.com.au  Fact Sheet for Healthcare Providers: IncredibleEmployment.be  This test is not yet approved or cleared by the [place] FDA and has been authorized for detection and/or diagnosis of SARS-CoV-2 by FDA under an Emergency Use Authorization (EUA). This EUA will remain in effect (meaning this test can be used) for the duration of the COVID-19 declaration under Section 564(b)(1) of the Act, 21 U.S.C. section 360bbb-3(b)(1), unless the authorization is terminated or revoked.  Performed at [facility], [place] [address] [place], [place] [address]      Labs: Basic Metabolic Panel: Recent Labs  Lab 11/28/21 0348 12/02/21 0512 12/04/21 0421  NA 138 136 137  K 4.3 4.3 4.3  CL 104 102 100  CO2 $Re'30 29 29  'sfQ$ GLUCOSE 107* 112* 114*  BUN $Re'22 15 17  'JCc$ CREATININE 0.45* 0.47* 0.44*  CALCIUM 8.3* 8.5* 9.1   Liver Function Tests: No results for input(s): AST, ALT, ALKPHOS, BILITOT, PROT, ALBUMIN in the last 168 hours. No results for input(s): LIPASE, AMYLASE in the last 168 hours. No results for input(s): AMMONIA in the last 168 hours. CBC: Recent Labs  Lab 11/28/21 0348 12/02/21 0512 12/04/21 0421  WBC 5.2 4.3 4.0  NEUTROABS 3.0  --   --   HGB 10.0* 11.3* 12.3*  HCT 33.0* 36.1* 39.3  MCV 87.1 87.6 84.5  PLT 247 312 432*   Cardiac Enzymes: No results for input(s): CKTOTAL, CKMB, CKMBINDEX, TROPONINI in the last 168 hours. BNP: BNP (last 3 results) Recent Labs    05/09/21 0232 11/22/21 0401  BNP 114.1* 126.6*    ProBNP (last 3 results) No results for input(s): PROBNP in the last 8760  hours.  CBG: Recent Labs  Lab 12/03/21 2023 12/04/21 0016 12/04/21 0408 12/04/21 0731 12/04/21 1149  GLUCAP 94 94 97 105* 113*       Signed:  Kayleen Memos, MD [facility] 12/04/2021, 6:26 PM

## 2021-12-04 NOTE — Progress Notes (Signed)
Brief Nutrition Note  RD received page via on-call pager from RN regarding pt's tube feeding regimen for discharge to SNF. Pt originally going to discharge tonight to SNF but SNF did not have correct feeding pump equipment to accommodate continuous tube feeds. Plan has changed and pt will now discharge tomorrow to SNF.  Given pt had PEG placed on 11/26/21, pt would be appropriate for bolus tube feeds if able to tolerate. RN requested RD leave bolus tube feeding recommendations for use in the event that bolus tube feeds are required upon discharge to SNF.  Bolus tube feeding recommendations: - 355 ml (1.5 cartons) Jevity 1.5 cal formula QID via PEG at 0800, 1200, 1600, and 2000 (total of 6 cartons daily) - ProSource TF 45 ml daily - Free water flushes of 150 ml q 4 hours  Recommended bolus tube feeding regimen with free water flushes would provide 2170 kcal, 102 grams of protein, and 1980 ml of H2O.    Mertie Clause, MS, RD, LDN Inpatient Clinical Dietitian Please see AMiON for contact information.

## 2021-12-04 NOTE — TOC Transition Note (Addendum)
Transition of Care [facility]) - CM/SW Discharge Note   Patient Details  Name: [name] [name] MRN: [id number] Date of Birth: 1935-10-11  Transition of Care [facility]) CM/SW Contact:  Darleene Cleaver, LCSW Phone Number: 12/04/2021, 2:41 PM   Clinical Narrative:     CSW received phone call from [place] in admissions that patient has been approved for SNF to accept patient today.  CSW updated attending physician and bedside nurse.  Patient to be d/c'ed today to [facility] for Nursing and Rehab (formerly [place]).  Patient and family agreeable to plans will transport via ems RN to call report [phone] room 120A.  Patient's wife is aware that patient is discharging today.  5:30pm  CSW spoke to [place] at [facility], they are trying to get the correct pump and formula for patient.  SNF requested that discharge be planned for tomorrow just to ensure patient gets the correct equipment and formula.  CSW updated attending physician, bedside nurse and patient's wife.  EMS cancelled, and CSW to follow up tomorrow and arrange transport as long as patient is medically ready for discharge and orders have been received.     Final next level of care: Skilled Nursing Facility Barriers to Discharge: Barriers Resolved   Patient Goals and CMS Choice Patient states their goals for this hospitalization and ongoing recovery are:: To go to [facility] Nursing and [place]) CMS Medicare.gov Compare Post Acute Care list provided to:: Patient Represenative (must comment) Choice offered to / list presented to : Spouse  Discharge Placement   SNF placement  Existing PASRR number confirmed : 11/29/21          Patient chooses bed at: Other - please specify in the comment section below: [facility] for Nursing [facility])) Patient to be transferred to facility by: [facility] Name of family member notified: Wife Alvira Philips Patient and family  notified of of transfer: 12/04/21  Discharge Plan and Services                                     Social Determinants of Health (SDOH) Interventions     Readmission Risk Interventions Readmission Risk Prevention Plan 10/22/2021  Transportation Screening Complete  Medication Review Oceanographer) Referral to Pharmacy  PCP or Specialist appointment within 3-5 days of discharge Complete  HRI or Home Care Consult Not Complete  HRI or Home Care Consult Pt Refusal Comments from SNF  SW Recovery Care/Counseling Consult Complete  Palliative Care Screening Not Applicable  Skilled Nursing Facility Complete  Some recent data might be hidden

## 2021-12-05 LAB — GLUCOSE, CAPILLARY
Glucose-Capillary: 111 mg/dL — ABNORMAL HIGH (ref 70–99)
Glucose-Capillary: 100 mg/dL — ABNORMAL HIGH (ref 70–99)
Glucose-Capillary: 141 mg/dL — ABNORMAL HIGH (ref 70–99)

## 2021-12-05 NOTE — TOC Transition Note (Addendum)
Transition of Care [facility]) - CM/SW Discharge Note   Patient Details  Name: [name] [name] MRN: [id number] Date of Birth: 01-07-1935  Transition of Care [facility]) CM/SW Contact:  Darleene Cleaver, LCSW Phone Number: 12/05/2021, 11:15 AM   Clinical Narrative:     CSW spoke to [place] at [facility] ([facility] [place]), and she said patient can discharge today.  Per grace she requested [facility] to be contacted for transport.  CSW updated attending physician and bedside nurse.  Patient to be d/c'ed today to [facility]hab ([facility]).  Patient and family agreeable to plans will transport via ems RN to report to [phone] room 120A.  CSW updated patient's wife she is aware that patient is discharging today.     Final next level of care: Skilled Nursing Facility Barriers to Discharge: Barriers Resolved   Patient Goals and CMS Choice Patient states their goals for this hospitalization and ongoing recovery are:: To go to [facility] for Nursing and [place], [facility]) CMS Medicare.gov Compare Post Acute Care list provided to:: Patient Represenative (must comment) Choice offered to / list presented to : Spouse  Discharge Placement   Existing PASRR number confirmed : 11/29/21          Patient chooses bed at: Other - please specify in the comment section below: [facility] Nursing [facility]hab [facility])) Patient to be transferred to facility by: [facility] Name of family member notified: Wife Alvira Philips Patient and family notified of of transfer: 12/04/21  Discharge Plan and Services    [facility] for Nursing and Rehab.                                 Social Determinants of Health (SDOH) Interventions     Readmission Risk Interventions Readmission Risk Prevention Plan 10/22/2021  Transportation Screening Complete  Medication Review Oceanographer)  Referral to Pharmacy  PCP or Specialist appointment within 3-5 days of discharge Complete  HRI or Home Care Consult Not Complete  HRI or Home Care Consult Pt Refusal Comments from SNF  SW Recovery Care/Counseling Consult Complete  Palliative Care Screening Not Applicable  Skilled Nursing Facility Complete  Some recent data might be hidden

## 2021-12-05 NOTE — Discharge Summary (Signed)
Discharge Summary  [name] [name] EGB:[id number] DOB: 04-06-1935  PCP: Raymondo Band, MD  Admit date: 11/18/2021 Discharge date: 12/05/2021  Time spent: 35 minutes  Recommendations for Outpatient Follow-up:  Follow-up with your PCP Take your medications as prescribed.  Discharge Diagnoses:  Active Hospital Problems   Diagnosis Date Noted   Acute encephalopathy 37/09/6268   Acute metabolic encephalopathy 48/54/6270   Recurrent pneumonia 10/20/2021   Pressure injury of skin 05/11/2021   Seizure ([facility]) 03/09/2021   Dementia ([facility]) 02/25/2021   Essential hypertension 07/11/2007    Resolved Hospital Problems  No resolved problems to display.    Discharge Condition: Stable.   Diet recommendation:   Nutrition Follow-up:  Continuous Tube Feeding Recommendations by Registered Dietitian:   INTERVENTION:  - continue evity 1.2 @ 70 ml/hr with 45 ml Prosource TF once/day and 150 ml free water QID.     NUTRITION DIAGNOSIS:    Inadequate oral intake related to inability to eat as evidenced by NPO status. -ongoing   GOAL:    Patient will meet greater than or equal to 90% of their needs -met with TF regimen   MONITOR:    TF tolerance, Diet advancement, Labs, Weight trends, Skin     ASSESSMENT:    86 y.o. male with known history of dementia, seizures, BPH and decubitus ulcers and pericardial effusion anemia was recently admitted for pneumonia discharged about 2 weeks ago was brought to the ER after patient was found to be increasingly confused and lethargic.   Patient remains NPO. PEG placed on 11/26/21. He is receiving TF at goal regimen: Jevity 1.2 @ 70 ml/hr with 45 ml Prosource TF once/day and 150 ml free water QID. This regimen is providing 2056 kcal, 104 grams protein, and 1956 ml free water.    Weight has been stable throughout hospitalization. Non-pitting edema to BUE documented in the edema section of flow sheet.    Medications reviewed; 15 ml liquid  multivitamin/day.     Diet Order:   Diet Order                  Diet NPO time specified  Diet effective now                         EDUCATION NEEDS:    No education needs have been identified at this time   Skin:  Skin Assessment: Skin Integrity Issues: Skin Integrity Issues:: DTI, Other (Comment) DTI: bilateral heels Other: skin tears to bilateral pre-tibial area, sacrum, and L thigh   Height:       Ht Readings from Last 1 Encounters:  11/18/21 _0  (1.854 m)      Weight:       Wt Readings from Last 1 Encounters:  11/30/21 80.2 kg      Estimated Nutritional Needs:  Kcal:  2000-2200 kcal Protein:  100-115 grams Fluid:  >/= 2 L/day  ------------------------------------------------------------------   Bolus tube feeding recommendations by Registered Dietitian: - 355 ml (1.5 cartons) Jevity 1.5 cal formula QID via PEG at 0800, 1200, 1600, and 2000 (total of 6 cartons daily) - ProSource TF 45 ml daily - Free water flushes of 150 ml q 4 hours   Recommended bolus tube feeding regimen with free water flushes would provide 2170 kcal, 102 grams of protein, and 1980 ml of H2O.        Vitals:   12/04/21 2008 12/05/21 0433  BP: (!) 149/82 (!) 138/92  Pulse: Marland Kitchen)  57 87  Resp: 16 16  Temp: 98.2 F (36.8 C) 97.6 F (36.4 C)  SpO2: 99% 97%    History of present illness:   Patient is a 86 year old male with history of seizure, Advanced dementia ,nursing home resident who was discharged from here  2 weeks ago for COVID-pneumonia, found to have lethargy per nursing staff patient is normally talkative, aaox1, will even sing to the snf RN, but he was noticed to have increased crackles and rhonchi required suctioning. He was suspected to have recurrent aspiration pneumonia. After discussion with family, we decided to go ahead and put PEG tube.  IR performed PEG placement on 11/26/2021.  Patient is medically stable for discharge to skilled nursing facility as soon as bed  is available.  12/05/2021: Patient was seen and examined at his bedside.  There were no acute events overnight.  He has no new complaint.    Hospital Course:  Principal Problem:   Acute encephalopathy Active Problems:   Essential hypertension   Dementia ([facility])   Seizure ([facility])   Pressure injury of skin   Recurrent pneumonia   Acute metabolic encephalopathy  Acute metabolic encephalopathy, Possibly from recurrent aspiration pneumonia CT head no acute findings, ammonia level unremarkable, valproic acid level below low therapeutic Completed antibiotics Blood culture no growth, urine culture no growth, COVID screening negative Mental status has improved after risperidone has been discontinued   Left Pleural effusion Monitor, small to moderate as per CT.  Given a dose of  IV Lasix x1, echo showed diastolic dysfunction Since respiratory status is stable, we will not do thoracentesis at this time    Right upper extremity edema venous Doppler negative for  DVT Elevate right arm   History of seizure On Depakote and Keppra EEG :no seizure    Left hip pain Pelvic x-ray concerning for nondisplaced left femur fracture, recommend CT for further evaluation , CT did not reveal fracture   BPH Oral meds held, cant be given through tube    FTT/Dysphagia/high risk for aspiration Frequent falls in the past when he was at home, he was discharge to snf on 11/11, he is not ambulatory.  Now has significantly declined.   He has high risks of aspiration in the future.  Underwent PEG tube placement on 11/26/2021.  Plan is to discharge him back to skilled nursing facility    Advanced dementia/from nursing home: Continue supportive care. Delirium  precautions.  On risperidone at bedtime, discontinued due to excessive sleepiness.  Plan is to discharge to SNF.  His previous facility is not accepting him anymore because he has a balance to be paid.  Difficult placement due to patient being self-pay as has  used up Medicare days   Pressure Injury 05/08/21 Coccyx Mid Stage 1 -  Intact skin with non-blanchable redness of a localized area usually over a bony prominence. (Active)  05/08/21 0515  Location: Coccyx  Location Orientation: Mid  Staging: Stage 1 -  Intact skin with non-blanchable redness of a localized area usually over a bony prominence.  Wound Description (Comments):   Present on Admission: Yes     Pressure Injury 10/25/21 Heel Left Deep Tissue Pressure Injury - Purple or maroon localized area of discolored intact skin or blood-filled blister due to damage of underlying soft tissue from pressure and/or shear. purple wound on heel (Active)  10/25/21 0800  Location: Heel  Location Orientation: Left  Staging: Deep Tissue Pressure Injury - Purple or maroon localized area of discolored intact skin  or blood-filled blister due to damage of underlying soft tissue from pressure and/or shear.  Wound Description (Comments): purple wound on heel  Present on Admission:      Pressure Injury 10/25/21 Heel Right Deep Tissue Pressure Injury - Purple or maroon localized area of discolored intact skin or blood-filled blister due to damage of underlying soft tissue from pressure and/or shear. purple wound on heel (Active)  10/25/21 0800  Location: Heel  Location Orientation: Right  Staging: Deep Tissue Pressure Injury - Purple or maroon localized area of discolored intact skin or blood-filled blister due to damage of underlying soft tissue from pressure and/or shear.  Wound Description (Comments): purple wound on heel  Present on Admission:      Pressure Injury 11/18/21 Sacrum (Active)  11/18/21 2357  Location: Sacrum  Location Orientation:   Staging:   Wound Description (Comments):   Present on Admission: Yes            Nutrition Problem: Inadequate oral intake Etiology: inability to eat       DVT prophylaxis:Lovenox Code Status: Full Family Communication:: None at bedside Patient  status:   Dispo: The patient is from: SNF              Anticipated d/c is to: SNF/long term care              Anticipated d/c date is: Medically stable for discharge   Consultants: Palliative care   Procedures: PEG tube placement   Antimicrobials:  Anti-infectives (From admission, onward)        Start     Dose/Rate Route Frequency Ordered Stop    11/26/21 1000   ceFAZolin (ANCEF) IVPB 2g/100 mL premix        2 g 200 mL/hr over 30 Minutes Intravenous To Radiology 11/25/21 1643 11/26/21 1557    11/26/21 0700   ceFAZolin (ANCEF) IVPB 2g/100 mL premix  Status:  Discontinued        2 g 200 mL/hr over 30 Minutes Intravenous To Radiology 11/24/21 1639 11/24/21 1640    11/25/21 1115   ceFAZolin (ANCEF) IVPB 2g/100 mL premix  Status:  Discontinued        2 g 200 mL/hr over 30 Minutes Intravenous To Radiology 11/25/21 1026 11/25/21 1643    11/22/21 1400   amoxicillin-clavulanate (AUGMENTIN) 250-62.5 MG/5ML suspension 500 mg        500 mg Per Tube Every 8 hours 11/22/21 1037 11/25/21 2216    11/19/21 1800   Ampicillin-Sulbactam (UNASYN) 3 g in sodium chloride 0.9 % 100 mL IVPB  Status:  Discontinued        3 g 200 mL/hr over 30 Minutes Intravenous Every 6 hours 11/19/21 1644 11/22/21 1036    11/19/21 0100   cefTRIAXone (ROCEPHIN) 2 g in sodium chloride 0.9 % 100 mL IVPB  Status:  Discontinued        2 g 200 mL/hr over 30 Minutes Intravenous Every 24 hours 11/18/21 2358 11/19/21 1621    11/19/21 0100   azithromycin (ZITHROMAX) 500 mg in sodium chloride 0.9 % 250 mL IVPB  Status:  Discontinued        500 mg 250 mL/hr over 60 Minutes Intravenous Every 24 hours 11/18/21 2358 11/19/21 1621    11/18/21 2145   vancomycin (VANCOREADY) IVPB 1500 mg/300 mL  Status:  Discontinued        1,500 mg 150 mL/hr over 120 Minutes Intravenous  Once 11/18/21 2137 11/19/21 0023    11/18/21 2145  ceFEPIme (MAXIPIME) 2 g in sodium chloride 0.9 % 100 mL IVPB  Status:  Discontinued        2 g 200 mL/hr over 30  Minutes Intravenous  Once 11/18/21 2138 11/19/21 0007    11/18/21 2130   ceFEPIme (MAXIPIME) 1 g in sodium chloride 0.9 % 100 mL IVPB  Status:  Discontinued        1 g 200 mL/hr over 30 Minutes Intravenous  Once 11/18/21 2125 11/18/21 2138             Discharge Exam: BP (!) 138/92 (BP Location: Left Arm)   Pulse 87   Temp 97.6 F (36.4 C) (Oral)   Resp 16   Ht _0  (1.854 m)   Wt 80 kg   SpO2 97%   BMI 23.27 kg/m  General: 86 y.o. year-old male well developed well nourished in no acute distress.  Alert and minimally interactive. Cardiovascular: Regular rate and rhythm with no rubs or gallops.  No thyromegaly or JVD noted.   Respiratory: Clear to auscultation with no wheezes or rales. Good inspiratory effort. Abdomen: Soft nontender nondistended with normal bowel sounds x4 quadrants. Musculoskeletal: No lower extremity edema.  Psychiatry: Mood is appropriate for condition and setting  Discharge Instructions You were cared for by a hospitalist during your hospital stay. If you have any questions about your discharge medications or the care you received while you were in the hospital after you are discharged, you can call the unit and asked to speak with the hospitalist on call if the hospitalist that took care of you is not available. Once you are discharged, your primary care physician will handle any further medical issues. Please note that NO REFILLS for any discharge medications will be authorized once you are discharged, as it is imperative that you return to your primary care physician (or establish a relationship with a primary care physician if you do not have one) for your aftercare needs so that they can reassess your need for medications and monitor your lab values.  Discharge Instructions     Diet general   Complete by: As directed    Tube feeding   Discharge instructions   Complete by: As directed    1)Do CBC and BMP tests in a week   Discharge wound care:    Complete by: As directed    As per wound care   Increase activity slowly   Complete by: As directed       Allergies as of 12/05/2021       Reactions   Chocolate    Itching if he eats too much chocolate    Ciprofloxacin    Achilles tendon pain    Lipitor [atorvastatin]    Muscle aches         Medication List     STOP taking these medications    aluminum-magnesium hydroxide-simethicone 200-200-20 MG/5ML Susp Commonly known as: MAALOX   CO Q 10 PO   divalproex 500 MG DR tablet Commonly known as: DEPAKOTE   levETIRAcetam 500 MG tablet Commonly known as: KEPPRA Replaced by: levETIRAcetam 100 MG/ML solution   loperamide 2 MG capsule Commonly known as: IMODIUM   magnesium hydroxide 400 MG/5ML suspension Commonly known as: MILK OF MAGNESIA   MILK THISTLE PO   polyethylene glycol 17 g packet Commonly known as: MIRALAX / GLYCOLAX   promethazine 25 MG tablet Commonly known as: PHENERGAN   risperiDONE 0.5 MG tablet Commonly known as: RISPERDAL   senna 8.6  MG Tabs tablet Commonly known as: SENOKOT   tamsulosin 0.4 MG Caps capsule Commonly known as: FLOMAX       TAKE these medications    acetaminophen 500 MG tablet Commonly known as: TYLENOL Place 1 tablet (500 mg total) into feeding tube every 8 (eight) hours as needed. What changed:  how to take this when to take this reasons to take this   Azopt 1 % ophthalmic suspension Generic drug: brinzolamide Place 1 drop into both eyes 3 (three) times daily. Notes to patient: 12/04/2021 bedtime    donepezil 10 MG tablet Commonly known as: ARICEPT Place 1 tablet (10 mg total) into feeding tube at bedtime. What changed: how to take this Notes to patient: 12/04/2021 bedtime    feeding supplement (JEVITY 1.2 CAL) Liqd Place 1,000 mLs into feeding tube continuous.   feeding supplement (PROSource TF) liquid Place 45 mLs into feeding tube daily. Notes to patient: 12/05/2021    fish oil-omega-3 fatty  acids 1000 MG capsule Place 1 capsule (1 g total) into feeding tube daily. What changed: how to take this Notes to patient: 12/05/2021    free water Soln Place 150 mLs into feeding tube every 6 (six) hours.   Glucosamine-Chondroitin 500-400 MG Caps 1 tablet by PEG Tube route daily. What changed: how to take this Notes to patient: 12/05/2021    guaiFENesin 100 MG/5ML liquid Commonly known as: ROBITUSSIN Place 15 mLs into feeding tube every 4 (four) hours as needed for cough or to loosen phlegm. What changed: how to take this Notes to patient: 12/04/2021 had at 12 p.m.    latanoprost 0.005 % ophthalmic solution Commonly known as: XALATAN Place 1 drop into both eyes at bedtime. Notes to patient: 12/04/2021 bedtime    levETIRAcetam 100 MG/ML solution Commonly known as: KEPPRA Place 5 mLs (500 mg total) into feeding tube 2 (two) times daily. Replaces: levETIRAcetam 500 MG tablet Notes to patient: 12/04/2021 bedtime    melatonin 5 MG Tabs Place 2 tablets (10 mg total) into feeding tube at bedtime. What changed: how to take this Notes to patient: 12/04/2021 bedtime    memantine 10 MG tablet Commonly known as: NAMENDA Place 1 tablet (10 mg total) into feeding tube at bedtime. What changed:  how much to take how to take this when to take this additional instructions Notes to patient: 12/04/2021 bedtime    multivitamin with minerals tablet Place 1 tablet into feeding tube daily. What changed: how to take this Notes to patient: 12/05/2021    Rhopressa 0.02 % Soln Generic drug: Netarsudil Dimesylate Place 1 drop into both eyes at bedtime. Notes to patient: 12/04/2021 bedtime    valproic acid 250 MG/5ML solution Commonly known as: DEPAKENE Place 10 mLs (500 mg total) into feeding tube at bedtime. Notes to patient: 12/04/2021 bedtime    valproic acid 250 MG/5ML solution Commonly known as: DEPAKENE Place 20 mLs (1,000 mg total) into feeding tube daily. Notes to  patient: 12/05/2021    vitamin C 250 MG tablet Commonly known as: ASCORBIC ACID Place 1 tablet (250 mg total) into feeding tube daily. What changed: how to take this Notes to patient: 12/05/2021    zinc oxide 20 % ointment Apply topically 2 (two) times daily. Notes to patient: 12/04/2021 bedtime                Discharge Care Instructions  (From admission, onward)           Start     Ordered  11/30/21 0000  Discharge wound care:       Comments: As per wound care   11/30/21 1439           Allergies  Allergen Reactions   Chocolate     Itching if he eats too much chocolate    Ciprofloxacin     Achilles tendon pain    Lipitor [Atorvastatin]     Muscle aches     Contact information for follow-up providers     Raymondo Band, MD. Call today.   Specialty: Family Medicine Why: Please call for a posthospital follow-up appointment. Contact information: [place] [address] [phone]         Lelon Perla, MD .   Specialty: Cardiology Contact information: [address] [place] [address] [phone]              Contact information for after-discharge care     Destination     [facility] .   Service: Skilled Nursing Contact information: [address] [place] [phone]                      The results of significant diagnostics from this hospitalization (including imaging, microbiology, ancillary and laboratory) are listed below for reference.    Significant Diagnostic Studies: CT ABDOMEN PELVIS WO CONTRAST  Result Date: 11/23/2021 CLINICAL DATA:  Preoperative evaluation for gastric tube placement. EXAM: CT ABDOMEN AND PELVIS WITHOUT CONTRAST TECHNIQUE: Multidetector CT imaging of the abdomen and pelvis was performed following the standard protocol without IV contrast. COMPARISON:  08/20/2014.  05/25/2012. FINDINGS: Lower chest:  Bilateral pleural effusions larger on the left than the right with dependent atelectasis and or infiltrate, worse in the left lower lobe than the right lower lobe. Cardiomegaly. Small amount of pericardial fluid. Hepatobiliary: Liver appears normal without contrast. No calcified gallstones. Pancreas: Normal Spleen: Normal Adrenals/Urinary Tract: Adrenal glands are normal. Left kidney is normal. Right kidney shows a 17 mm calcified mass in the ventral upper pole. This is not enlarged and is actually slightly smaller than was seen in 2013 and 2015. This was felt previously to represent a benign calcified hemorrhagic or proteinaceous cyst, and the follow-up would be consistent with that diagnosis. No progressive or worrisome finding. Bladder is thick-walled. Stomach/Bowel: Nasogastric tube enters the stomach. Stomach appears normally positioned. There is no interposed colon presently between the body and antrum of the stomach and the anterior abdominal wall. The inferior aspect of the left lobe does overlie the lesser curvature region. No small bowel pathology is seen. Question the presence of a rectal mass. Digital examination recommended. Vascular/Lymphatic: Aortic atherosclerosis. No aneurysm. IVC is normal. No adenopathy. Reproductive: Normal except for enlarged prostate. Other: No free fluid or air. Small left inguinal hernia containing only fat. Musculoskeletal: This chronic degenerative changes affect the spine. IMPRESSION: Bilateral pleural effusions, larger on the left than the right, with atelectasis in both lower lobes, left worse than right. Cardiomegaly.  Pericardial effusion. No unexpected gastric anatomic findings. No interposed colon between the anterior wall of the body and antrum of the stomach and the anterior abdominal wall. Chronic 1.7 cm calcified region at the upper pole the right kidney, actually smaller than seen 79 years ago, consistent with a benign entity. See above discussion. Enlarged  prostate. Question rectal mass versus proctitis. Thick-walled bladder suggesting cystitis or chronic outlet obstruction. Small left inguinal hernia containing only fat. Electronically Signed  By: Nelson Chimes M.D.   On: 11/23/2021 19:08   DG Pelvis 1-2 Views  Result Date: 11/19/2021 CLINICAL DATA:  Unresponsiveness. EXAM: PELVIS - 1-2 VIEW COMPARISON:  None. FINDINGS: Evaluation is limited on the provided images. Faint linear subcapital lucency through the neck of the left femur concerning for a nondisplaced fracture. Dedicated left hip radiograph or CT is recommended for better evaluation. No dislocation. The bones are osteopenic. Degenerative changes of the lumbar spine. The soft tissues are unremarkable. IMPRESSION: Findings concerning for a nondisplaced left femoral neck fracture. Dedicated left hip radiograph or CT is recommended for better evaluation. Electronically Signed   By: Anner Crete M.D.   On: 11/19/2021 03:41   DG Abd 1 View  Result Date: 11/21/2021 CLINICAL DATA:  NG placement. EXAM: ABDOMEN - 1 VIEW COMPARISON:  Earlier radiograph dated 11/19/2021. FINDINGS: Feeding tube with weighted tip in the upper abdomen likely in the proximal stomach. Small left and possibly trace right pleural effusion. There is bibasilar atelectasis or infiltrate. Bilateral hazy and interstitial densities may represent edema. Pneumonia is not excluded. There is cardiomegaly. No acute osseous pathology. IMPRESSION: Feeding tube with weighted tip in the proximal stomach. Electronically Signed   By: Anner Crete M.D.   On: 11/21/2021 03:57   CT HEAD WO CONTRAST (5MM)  Result Date: 11/18/2021 CLINICAL DATA:  Delirium. EXAM: CT HEAD WITHOUT CONTRAST TECHNIQUE: Contiguous axial images were obtained from the base of the skull through the vertex without intravenous contrast. COMPARISON:  10/25/2021. FINDINGS: Brain: No acute intracranial hemorrhage, midline shift or mass effect. Generalized atrophy is noted.  Subcortical and periventricular white matter hypodensities are seen bilaterally. There is no hydrocephalus. Vascular: No hyperdense vessel or unexpected calcification. Skull: Normal. Negative for fracture or focal lesion. Sinuses/Orbits: Round density is present in the left maxillary sinus, possible mucosal retention cyst or polyp. There is partial opacification of the ethmoid air cells bilaterally. The orbits are stable. Other: None. IMPRESSION: Stable head CT with no acute intracranial process. Electronically Signed   By: Brett Fairy M.D.   On: 11/18/2021 20:48   CT Chest Wo Contrast  Result Date: 11/18/2021 CLINICAL DATA:  Chest pain, shortness of breath. Altered mental status. Recent pneumonia. Recent COVID positive. EXAM: CT CHEST WITHOUT CONTRAST TECHNIQUE: Multidetector CT imaging of the chest was performed following the standard protocol without IV contrast. COMPARISON:  None. FINDINGS: Cardiovascular: Mild cardiomegaly. Small pericardial effusion is present. Calcified plaque over the left anterior descending and lateral circumflex coronary arteries. No evidence of thoracic aortic aneurysm. Calcified plaque is present over the thoracic aorta. Remaining vascular structures are unremarkable. Mediastinum/Nodes: No definite mediastinal or hilar adenopathy. Remaining mediastinal structures are unremarkable. Lungs/Pleura: Lungs are adequately inflated and demonstrate patchy hazy opacification over the mid to lower lungs bilaterally. Consolidation over the left base likely compressive atelectasis with small to moderate left effusion. Airways are normal. Upper Abdomen: Minimal calcified plaque over the abdominal aorta. Oval partially calcified rounded mass over the upper pole right kidney slightly smaller compared to 2015. No acute findings. Musculoskeletal: Degenerative change of the spine. No focal abnormality. IMPRESSION: 1. Patchy bilateral hazy airspace process which may be due to infection bacterial or  viral origin. Small to moderate left pleural effusion with associated left basilar consolidation likely compressive atelectasis. 2. Mild cardiomegaly with small pericardial effusion. Atherosclerotic coronary artery disease. 3. Aortic atherosclerosis. 4. Oval partially calcified rounded mass over the upper pole right kidney slightly smaller compared to 2015. Aortic Atherosclerosis (ICD10-I70.0). Electronically Signed  By: Marin Olp M.D.   On: 11/18/2021 20:52   CT PELVIS WO CONTRAST  Result Date: 11/19/2021 CLINICAL DATA:  Left femur fracture. EXAM: CT PELVIS WITHOUT CONTRAST TECHNIQUE: Multidetector CT imaging of the pelvis was performed following the standard protocol without intravenous contrast. COMPARISON:  Radiograph of same day. FINDINGS: Urinary Tract:  No abnormality visualized. Bowel: Rectal wall thickening is noted with surrounding inflammatory changes concerning for proctitis. Vascular/Lymphatic: Atherosclerosis of visualized portion of abdominal aorta is noted. No adenopathy is noted. Reproductive:  Mild prostatic enlargement is noted. Other:  No definite ascites or hernia is noted. Musculoskeletal: No definite fracture is noted. IMPRESSION: No definite fracture or other significant bony abnormality is noted. Rectal wall thickening is noted with surrounding inflammatory changes concerning for proctitis. Aortic Atherosclerosis (ICD10-I70.0). Electronically Signed   By: Marijo Conception M.D.   On: 11/19/2021 13:32   IR GASTROSTOMY TUBE MOD SED  Result Date: 11/26/2021 INDICATION: 86 year old gentleman with dementia, pneumonia, dysphagia requires feeding tube access. EXAM: Percutaneous gastrostomy tube placement MEDICATIONS: Ancef 2 g IV; Antibiotics were administered within 1 hour of the procedure. Glucagon 1 mg IV ANESTHESIA/SEDATION: Moderate (conscious) sedation was employed during this procedure. A total of Versed 1 mg and Fentanyl 50 mcg was administered intravenously by the radiology  nurse. Total intra-service moderate Sedation Time: 16 minutes. The patient's level of consciousness and vital signs were monitored continuously by radiology nursing throughout the procedure under my direct supervision. CONTRAST:  15 mL of Omnipaque 300-administered into the gastric lumen. FLUOROSCOPY TIME:  Fluoroscopy Time: 1 minutes 42 seconds (8 mGy). COMPLICATIONS: None immediate. PROCEDURE: Informed written consent was obtained from the patient after a thorough discussion of the procedural risks, benefits and alternatives. All questions were addressed. Maximal Sterile Barrier Technique was utilized including caps, mask, sterile gowns, sterile gloves, sterile drape, hand hygiene and skin antiseptic. A timeout was performed prior to the initiation of the procedure. The left hepatic lobe margin was marked utilizing ultrasound guidance. The epigastric region was prepped and draped in the usual sterile fashion. The stomach was insufflated utilizing the NG tube. Following local lidocaine administration, 2 gastropexies were placed to secure the anterior wall of the stomach to the anterior abdominal wall. Percutaneous access obtained into the gastric antrum at the center of the gastropexies with an 18 gauge needle. Guide wire advanced into the gastric lumen. Serial dilation was performed and peel-away sheath was placed. 44 French gastrostomy tube inserted over the guidewire into the gastric lumen. The G tube retention balloon was inflated with 10 mL of dilute contrast and retracted to the anterior gastric wall. Contrast administrated through the gastrostomy tube opacified the gastric lumen. The insertion site was covered with sterile dressing. IMPRESSION: 77 French gastrostomy tube placement as above. Electronically Signed   By: Miachel Roux M.D.   On: 11/26/2021 16:12   CT FEMUR LEFT WO CONTRAST  Result Date: 11/19/2021 CLINICAL DATA:  Suspected femoral fracture EXAM: CT OF THE LOWER LEFT EXTREMITY WITHOUT  CONTRAST TECHNIQUE: Multidetector CT imaging of the lower left extremity was performed according to the standard protocol. COMPARISON:  Pelvic x-ray earlier the same day. FINDINGS: Bones/Joint/Cartilage No acute fracture identified, specifically in the subcapital region of the femur which was suspected on plain film. Mild degenerative changes of the hip and moderate degenerative changes of the knee noted. Chondrocalcinosis noted in the knee medial and lateral compartments. Ligaments Suboptimally assessed by CT. Muscles and Tendons Calcific densities in the proximal hamstring tendons suggestive of chronic tendinosis.  No obvious significant muscle abnormality identified. Soft tissues Mild subcutaneous fat stranding edema in the thigh. Arterial vascular calcifications noted. IMPRESSION: No acute fracture or dislocation identified. Other chronic findings as described. Electronically Signed   By: Ofilia Neas M.D.   On: 11/19/2021 13:39   DG Chest Port 1 View  Result Date: 11/18/2021 CLINICAL DATA:  Possible sepsis. Altered mental status and shortness of breath. EXAM: PORTABLE CHEST 1 VIEW COMPARISON:  10/25/2021 FINDINGS: Lungs are somewhat hypoinflated with continued hazy opacification over the lung bases left worse than right. Left effusion and atelectasis is likely. Infection in the lung bases is possible. Cardiomediastinal silhouette and remainder of the exam is unchanged. IMPRESSION: Stable bibasilar opacification left worse than right likely effusions with atelectasis. Infection in the lung bases is possible. Electronically Signed   By: Marin Olp M.D.   On: 11/18/2021 19:38   DG Abd Portable 1V  Result Date: 11/19/2021 CLINICAL DATA:  NG-tube. EXAM: PORTABLE ABDOMEN - 1 VIEW COMPARISON:  Abdominal x-ray 10/25/2021. FINDINGS: Enteric tube tip is at the level of the gastric antrum. Bowel-gas pattern is nonobstructive. There are small densities in the right upper quadrant, possibly related to bowel  content. No acute fractures are seen. IMPRESSION: Enteric tube tip is at the level of the gastric antrum. Electronically Signed   By: Ronney Asters M.D.   On: 11/19/2021 19:43   DG Swallowing Func-Speech Pathology  Result Date: 11/22/2021 Table formatting from the original result was not included. Objective Swallowing Evaluation: Type of Study: MBS-Modified Barium Swallow Study  Patient Details Name: [name] [name] MRN: [id number] Date of Birth: 02/25/1935 Today's Date: 11/22/2021 Time: SLP Start Time (ACUTE ONLY): 1345 -SLP Stop Time (ACUTE ONLY): 4098 SLP Time Calculation (min) (ACUTE ONLY): 20 min Past Medical History: Past Medical History: Diagnosis Date  Allergy   Dementia ([place])   sees Dr. Ellouise Newer   Diverticulosis of colon (without mention of hemorrhage) 03-01-2004, 04-04-2011  Colonoscopy  ED (erectile dysfunction)   Glaucoma   sees Dr. Crecencio Mc   Hyperglycemia   Hyperlipidemia   Hypertension   Internal hemorrhoids 03-15-1999  Flex Sig   Irritable bowel  Past Surgical History: Past Surgical History: Procedure Laterality Date  COLONOSCOPY  04-04-11  per Dr. Sharlett Iles, clear, no repeats needed  EYE SURGERY    bilateral cataract extraction per Dr Dolores Lory  INGUINAL HERNIA REPAIR    KNEE ARTHROSCOPY    right knee  NASAL SINUS SURGERY   HPI: JAMOL GINYARD is a 86 y.o. male with known history of dementia, seizures, BPH, decubitus ulcers, pericardial effusion anemia was recently admitted for pneumonia discharged about 2 weeks ago was brought to the ER after patient was found to be increasingly confused and lethargic. CT chest was showing bilateral infiltrates concerning for pneumonia also was showing mild pericardial effusion. Most recent MBS 10/28/21 recommending Dys 2/honey thick and pt was able to upgrade to Dys 2/nectar prior to discharge.  Subjective: lethargic but able to particiate in minimal amount of PO intake and follow basic directions  Recommendations for follow up therapy are one component of a  multi-disciplinary discharge planning process, led by the attending physician.  Recommendations may be updated based on patient status, additional functional criteria and insurance authorization. Assessment / Plan / Recommendation Clinical Impressions 11/22/2021 Clinical Impression Patient presents with a mild oral and a severe pharyngeal phase dysphagia which is significantly declined as compared to MBS documented from 3 weeks ago. NG feeding tube in place during today's study  and was in place during prior MBS as well. During today's MBS, patient was lethargic and required cues to maintain alertness. He was able to follow basic directions to swallow again, cough. First PO administered was puree texture and majority of this bolus become retained in vallecular and pyriform sinus. He exhibited one instance of sensed aspiration (PAS 7) of moderate amount of puree which occured after the swallow and was from pyriform sinus residuals. Several other instances of aspiration occured, all of which were during or after swallows and all were silent. (PAS 8). SLP then gave patient spoon sip of nectar thick liquids, followed by spoon sip of thin liquid barium, both of which did not aid in reducing vallecular and sinus residuals. Very small amount of barium contrast would transit through UES with each swallow. Patient was able to cough and transit penetrate, aspirate out of laryngeal vestibule, but not consistently. There was a mild reduction in amount of vallecular and pyriform sinus residuals with multiple cued cough/throat clear and reswallows, dry swallows, however unable to achieve full clearance of pharyngeal residuals by end of study. SLP is recommending continue with NPO status and prognosis of PO advancement is guarded at this time. SLP Visit Diagnosis Dysphagia, oropharyngeal phase (R13.12) Attention and concentration deficit following -- Frontal lobe and executive function deficit following -- Impact on safety and  function Severe aspiration risk;Risk for inadequate nutrition/hydration   Treatment Recommendations 11/22/2021 Treatment Recommendations Therapy as outlined in treatment plan below   Prognosis 11/22/2021 Prognosis for Safe Diet Advancement Guarded Barriers to Reach Goals Severity of deficits;Cognitive deficits Barriers/Prognosis Comment -- Diet Recommendations 11/22/2021 SLP Diet Recommendations NPO Liquid Administration via -- Medication Administration Via alternative means Compensations -- Postural Changes --   Other Recommendations 11/22/2021 Recommended Consults -- Oral Care Recommendations Oral care QID;Staff/trained caregiver to provide oral care Other Recommendations -- Follow Up Recommendations Skilled nursing-short term rehab (<3 hours/day) Assistance recommended at discharge Frequent or constant Supervision/Assistance Functional Status Assessment Patient has had a recent decline in their functional status and/or demonstrates limited ability to make significant improvements in function in a reasonable and predictable amount of time Frequency and Duration  11/22/2021 Speech Therapy Frequency (ACUTE ONLY) min 1 x/week Treatment Duration 1 week   Oral Phase 11/22/2021 Oral Phase Impaired Oral - Pudding Teaspoon -- Oral - Pudding Cup -- Oral - Honey Teaspoon -- Oral - Honey Cup NT Oral - Nectar Teaspoon Delayed oral transit;Reduced posterior propulsion Oral - Nectar Cup NT Oral - Nectar Straw NT Oral - Thin Teaspoon Delayed oral transit;Reduced posterior propulsion Oral - Thin Cup NT Oral - Thin Straw NT Oral - Puree Delayed oral transit Oral - Mech Soft -- Oral - Regular NT Oral - Multi-Consistency -- Oral - Pill NT Oral Phase - Comment --  Pharyngeal Phase 11/22/2021 Pharyngeal Phase Impaired Pharyngeal- Pudding Teaspoon -- Pharyngeal -- Pharyngeal- Pudding Cup -- Pharyngeal -- Pharyngeal- Honey Teaspoon NT Pharyngeal -- Pharyngeal- Honey Cup NT Pharyngeal -- Pharyngeal- Nectar Teaspoon Reduced anterior  laryngeal mobility;Reduced airway/laryngeal closure;Penetration/Apiration after swallow;Moderate aspiration;Pharyngeal residue - valleculae;Pharyngeal residue - pyriform Pharyngeal -- Pharyngeal- Nectar Cup NT Pharyngeal -- Pharyngeal- Nectar Straw -- Pharyngeal -- Pharyngeal- Thin Teaspoon Delayed swallow initiation-vallecula;Reduced anterior laryngeal mobility;Reduced airway/laryngeal closure;Moderate aspiration;Penetration/Apiration after swallow;Penetration/Aspiration during swallow;Pharyngeal residue - valleculae;Pharyngeal residue - pyriform Pharyngeal -- Pharyngeal- Thin Cup NT Pharyngeal -- Pharyngeal- Thin Straw NT Pharyngeal -- Pharyngeal- Puree Delayed swallow initiation-vallecula;Penetration/Apiration after swallow;Moderate aspiration;Pharyngeal residue - pyriform;Pharyngeal residue - valleculae;Reduced anterior laryngeal mobility;Reduced airway/laryngeal closure Pharyngeal -- Pharyngeal- Mechanical Soft --  Pharyngeal -- Pharyngeal- Regular NT Pharyngeal -- Pharyngeal- Multi-consistency -- Pharyngeal -- Pharyngeal- Pill NT Pharyngeal -- Pharyngeal Comment --  Cervical Esophageal Phase  11/22/2021 Cervical Esophageal Phase Impaired Pudding Teaspoon -- Pudding Cup -- Honey Teaspoon -- Honey Cup -- Nectar Teaspoon Reduced cricopharyngeal relaxation Nectar Cup -- Nectar Straw -- Thin Teaspoon Reduced cricopharyngeal relaxation Thin Cup Reduced cricopharyngeal relaxation Thin Straw -- Puree Reduced cricopharyngeal relaxation Mechanical Soft -- Regular -- Multi-consistency -- Pill -- Cervical Esophageal Comment -- Sonia Baller, MA, CCC-SLP Speech Therapy                     EEG adult  Result Date: 11/19/2021 Lora Havens, MD     11/19/2021 12:57 PM Patient Name: [name] [name] MRN: [id number] Epilepsy Attending: Lora Havens Referring Physician/Provider: Dr Florencia Reasons Date: 11/19/2021 Duration: 22.41 mins Patient history: 86 year old male with altered mental status.  EEG to evaluate for seizure.  Level of alertness: Awake, asleep AEDs during EEG study: LEV Technical aspects: This EEG study was done with scalp electrodes positioned according to the 10-20 International system of electrode placement. Electrical activity was acquired at a sampling rate of _0  and reviewed with a high frequency filter of _1  and a low frequency filter of _2 . EEG data were recorded continuously and digitally stored. Description: During awake state, no clear posterior dominant rhythm was seen.  Sleep was characterized by sleep spindles (12 to 14 Hz), maximal frontocentral region.  EEG showed continuous generalized predominantly 5 to 6 Hz theta as well as intermittent generalized 2 to 3 Hz delta slowing. Hyperventilation and photic stimulation were not performed.   ABNORMALITY - Continuous slow, generalized IMPRESSION: This study is suggestive of moderate diffuse encephalopathy, nonspecific etiology. No seizures or epileptiform discharges were seen throughout the recording. Lora Havens   ECHOCARDIOGRAM COMPLETE  Result Date: 11/19/2021    ECHOCARDIOGRAM REPORT   Patient Name:   [name] [name] Date of Exam: 11/19/2021 Medical Rec #:  [id number]        Height:       73.0 in Accession #:    [id number]       Weight:       166.0 lb Date of Birth:  Jun 12, 1935        BSA:          1.988 m Patient Age:    43 years         BP:           130/69 mmHg Patient Gender: M                HR:           67 bpm. Exam Location:  Inpatient Procedure: 2D Echo, Cardiac Doppler and Color Doppler Indications:    Pericardial effusion I31.3  History:        Patient has prior history of Echocardiogram examinations, most                 recent 02/09/2021. Arrythmias:RBBB, Signs/Symptoms:Dementia; Risk                 Factors:Hypertension and Dyslipidemia. Recurrent pneumonia.                 History of pericardial effusion.  Sonographer:    Darlina Sicilian RDCS Referring Phys: [place]  1. Left ventricular ejection  fraction, by estimation, is 60 to 65%. The left ventricle has normal function. The left ventricle has  no regional wall motion abnormalities. Left ventricular diastolic parameters are consistent with Grade II diastolic dysfunction (pseudonormalization). Elevated left ventricular end-diastolic pressure.  2. Right ventricular systolic function is normal. The right ventricular size is normal. There is normal pulmonary artery systolic pressure.  3. The pericardial effusion is posterior to the left ventricle and lateral to the left ventricle.  4. The mitral valve is abnormal. Trivial mitral valve regurgitation. No evidence of mitral stenosis.  5. The aortic valve is normal in structure. There is mild calcification of the aortic valve. Aortic valve regurgitation is mild to moderate. Aortic valve sclerosis/calcification is present, without any evidence of aortic stenosis.  6. Aortic dilatation noted. There is mild dilatation of the aortic root, measuring 39 mm. There is mild dilatation of the ascending aorta, measuring 41 mm.  7. The inferior vena cava is normal in size with greater than 50% respiratory variability, suggesting right atrial pressure of 3 mmHg. FINDINGS  Left Ventricle: Left ventricular ejection fraction, by estimation, is 60 to 65%. The left ventricle has normal function. The left ventricle has no regional wall motion abnormalities. The left ventricular internal cavity size was normal in size. There is  no left ventricular hypertrophy. Left ventricular diastolic parameters are consistent with Grade II diastolic dysfunction (pseudonormalization). Elevated left ventricular end-diastolic pressure. Right Ventricle: The right ventricular size is normal. No increase in right ventricular wall thickness. Right ventricular systolic function is normal. There is normal pulmonary artery systolic pressure. The tricuspid regurgitant velocity is 2.28 m/s, and  with an assumed right atrial pressure of 3 mmHg, the estimated  right ventricular systolic pressure is 73.4 mmHg. Left Atrium: Left atrial size was normal in size. Right Atrium: Right atrial size was normal in size. Pericardium: Trivial pericardial effusion is present. The pericardial effusion is posterior to the left ventricle and lateral to the left ventricle. Mitral Valve: The mitral valve is abnormal. There is mild thickening of the mitral valve leaflet(s). Trivial mitral valve regurgitation. No evidence of mitral valve stenosis. Tricuspid Valve: The tricuspid valve is normal in structure. Tricuspid valve regurgitation is mild . No evidence of tricuspid stenosis. Aortic Valve: The aortic valve is normal in structure. There is mild calcification of the aortic valve. Aortic valve regurgitation is mild to moderate. Aortic regurgitation PHT measures 690 msec. Aortic valve sclerosis/calcification is present, without any evidence of aortic stenosis. Pulmonic Valve: The pulmonic valve was normal in structure. Pulmonic valve regurgitation is mild. No evidence of pulmonic stenosis. Aorta: The aortic root is normal in size and structure and aortic dilatation noted. There is mild dilatation of the aortic root, measuring 39 mm. There is mild dilatation of the ascending aorta, measuring 41 mm. Venous: The inferior vena cava is normal in size with greater than 50% respiratory variability, suggesting right atrial pressure of 3 mmHg. IAS/Shunts: No atrial level shunt detected by color flow Doppler.  LEFT VENTRICLE PLAX 2D LVIDd:         5.40 cm   Diastology LVIDs:         3.10 cm   LV e' medial:    3.73 cm/s LV PW:         0.80 cm   LV E/e' medial:  18.3 LV IVS:        1.10 cm   LV e' lateral:   3.89 cm/s LVOT diam:     2.30 cm   LV E/e' lateral: 17.5 LV SV:         64 LV SV Index:  32 LVOT Area:     4.15 cm                           3D Volume EF:                          3D EF:        63 %                          LV EDV:       130 ml                          LV ESV:       49 ml                           LV SV:        81 ml RIGHT VENTRICLE RV S prime:     12.90 cm/s TAPSE (M-mode): 2.0 cm LEFT ATRIUM             Index        RIGHT ATRIUM           Index LA diam:        2.70 cm 1.36 cm/m   RA Area:     10.30 cm LA Vol (A2C):   33.0 ml 16.60 ml/m  RA Volume:   17.80 ml  8.95 ml/m LA Vol (A4C):   26.7 ml 13.43 ml/m LA Biplane Vol: 30.5 ml 15.34 ml/m  AORTIC VALVE LVOT Vmax:   82.90 cm/s LVOT Vmean:  58.800 cm/s LVOT VTI:    0.155 m AI PHT:      690 msec  AORTA Ao Root diam: 3.90 cm Ao STJ diam:  3.1 cm Ao Asc diam:  4.05 cm MITRAL VALVE               TRICUSPID VALVE MV Area (PHT): 2.91 cm    TR Peak grad:   20.8 mmHg MV Decel Time: 261 msec    TR Vmax:        228.00 cm/s MV E velocity: 68.10 cm/s MV A velocity: 74.10 cm/s  SHUNTS MV E/A ratio:  0.92        Systemic VTI:  0.16 m                            Systemic Diam: 2.30 cm Jenkins Rouge MD Electronically signed by Jenkins Rouge MD Signature Date/Time: 11/19/2021/11:10:26 AM    Final    VAS [place] UPPER EXTREMITY VENOUS DUPLEX  Result Date: 11/22/2021 UPPER VENOUS STUDY  Patient Name:  [name] [name]  Date of Exam:   11/21/2021 Medical Rec #: [id number]         Accession #:    [id number] Date of Birth: [birth year]         Patient Gender: M Patient Age:   55 years Exam Location[facility]  [facility] Procedure:      VAS [place] UPPER EXTREMITY VENOUS DUPLEX Referring Phys: Annamaria Boots XU --------------------------------------------------------------------------------  Indications: Swelling Limitations: Patient immobilty. Comparison Study: No previous exams Performing Technologist: Jody Hill RVT, RDMS  Examination Guidelines: A complete evaluation includes B-mode imaging, spectral Doppler, color Doppler, and power Doppler as needed of all accessible portions of each  vessel. Bilateral testing is considered an integral part of a complete examination. Limited examinations for reoccurring indications may be performed as noted.  Right Findings:  +----------+------------+---------+-----------+----------+-------+ RIGHT     CompressiblePhasicitySpontaneousPropertiesSummary +----------+------------+---------+-----------+----------+-------+ IJV           Full       Yes       Yes                      +----------+------------+---------+-----------+----------+-------+ Subclavian    Full       Yes       Yes                      +----------+------------+---------+-----------+----------+-------+ Axillary      Full       Yes       Yes                      +----------+------------+---------+-----------+----------+-------+ Brachial      Full                                          +----------+------------+---------+-----------+----------+-------+ Radial        Full                                          +----------+------------+---------+-----------+----------+-------+ Ulnar         Full                                          +----------+------------+---------+-----------+----------+-------+ Cephalic      Full                                          +----------+------------+---------+-----------+----------+-------+ Basilic       Full       Yes       Yes                      +----------+------------+---------+-----------+----------+-------+  Left Findings: +----------+------------+---------+-----------+----------+-------+ LEFT      CompressiblePhasicitySpontaneousPropertiesSummary +----------+------------+---------+-----------+----------+-------+ Subclavian    Full       Yes       Yes                      +----------+------------+---------+-----------+----------+-------+  Summary:  Right: No evidence of deep vein thrombosis in the upper extremity. No evidence of superficial vein thrombosis in the upper extremity.  Left: No evidence of thrombosis in the subclavian.  *See table(s) above for measurements and observations.  Diagnosing physician: Orlie Pollen Electronically signed by Orlie Pollen on 11/22/2021 at 10:23:16 AM.    Final     Microbiology: Recent Results (from the past 240 hour(s))  Resp Panel by RT-PCR (Flu A&B, Covid) Nasopharyngeal Swab     Status: None   Collection Time: 11/30/21  1:29 PM   Specimen: Nasopharyngeal Swab; Nasopharyngeal(NP) swabs in vial transport medium  Result Value Ref Range Status   SARS Coronavirus 2 by RT PCR NEGATIVE NEGATIVE Final    Comment: (NOTE) SARS-CoV-2 target nucleic acids are NOT  DETECTED.  The SARS-CoV-2 RNA is generally detectable in upper respiratory specimens during the acute phase of infection. The lowest concentration of SARS-CoV-2 viral copies this assay can detect is 138 copies/mL. A negative result does not preclude SARS-Cov-2 infection and should not be used as the sole basis for treatment or other patient management decisions. A negative result may occur with  improper specimen collection/handling, submission of specimen other than nasopharyngeal swab, presence of viral mutation(s) within the areas targeted by this assay, and inadequate number of viral copies(<138 copies/mL). A negative result must be combined with clinical observations, patient history, and epidemiological information. The expected result is Negative.  Fact Sheet for Patients:  EntrepreneurPulse.com.au  Fact Sheet for Healthcare Providers:  IncredibleEmployment.be  This test is no t yet approved or cleared by the [place] FDA and  has been authorized for detection and/or diagnosis of SARS-CoV-2 by FDA under an Emergency Use Authorization (EUA). This EUA will remain  in effect (meaning this test can be used) for the duration of the COVID-19 declaration under Section 564(b)(1) of the Act, 21 U.S.C.section 360bbb-3(b)(1), unless the authorization is terminated  or revoked sooner.       Influenza A by PCR NEGATIVE NEGATIVE Final   Influenza B by PCR NEGATIVE NEGATIVE Final    Comment: (NOTE) The  Xpert Xpress SARS-CoV-2/FLU/RSV plus assay is intended as an aid in the diagnosis of influenza from Nasopharyngeal swab specimens and should not be used as a sole basis for treatment. Nasal washings and aspirates are unacceptable for Xpert Xpress SARS-CoV-2/FLU/RSV testing.  Fact Sheet for Patients: EntrepreneurPulse.com.au  Fact Sheet for Healthcare Providers: IncredibleEmployment.be  This test is not yet approved or cleared by the [place] FDA and has been authorized for detection and/or diagnosis of SARS-CoV-2 by FDA under an Emergency Use Authorization (EUA). This EUA will remain in effect (meaning this test can be used) for the duration of the COVID-19 declaration under Section 564(b)(1) of the Act, 21 U.S.C. section 360bbb-3(b)(1), unless the authorization is terminated or revoked.  Performed at [facility], [place] [address] [place], [place] [address]      Labs: Basic Metabolic Panel: Recent Labs  Lab 12/02/21 0512 12/04/21 0421  NA 136 137  K 4.3 4.3  CL 102 100  CO2 29 29  GLUCOSE 112* 114*  BUN 15 17  CREATININE 0.47* 0.44*  CALCIUM 8.5* 9.1   Liver Function Tests: No results for input(s): AST, ALT, ALKPHOS, BILITOT, PROT, ALBUMIN in the last 168 hours. No results for input(s): LIPASE, AMYLASE in the last 168 hours. No results for input(s): AMMONIA in the last 168 hours. CBC: Recent Labs  Lab 12/02/21 0512 12/04/21 0421  WBC 4.3 4.0  HGB 11.3* 12.3*  HCT 36.1* 39.3  MCV 87.6 84.5  PLT 312 432*   Cardiac Enzymes: No results for input(s): CKTOTAL, CKMB, CKMBINDEX, TROPONINI in the last 168 hours. BNP: BNP (last 3 results) Recent Labs    05/09/21 0232 11/22/21 0401  BNP 114.1* 126.6*    ProBNP (last 3 results) No results for input(s): PROBNP in the last 8760 hours.  CBG: Recent Labs  Lab 12/04/21 1149 12/04/21 2003 12/04/21 2341 12/05/21 0430 12/05/21 0721  GLUCAP 113* 119*  115* 111* 100*       Signed:  Kayleen Memos, MD [facility] 12/05/2021, 10:27 AM

## 2021-12-05 NOTE — Progress Notes (Incomplete)
° ° ° ° ° ° °  Time Total: ***  Visit consisted of counseling and education dealing with the complex and emotionally intense issues of symptom management and palliative care in the setting of serious and potentially life-threatening illness.Greater than 50%  of this time was spent counseling and coordinating care related to the above assessment and plan.  Willette Alma, AGPCNP-BC  Palliative Medicine Team [phone]

## 2021-12-05 NOTE — TOC Progression Note (Addendum)
Transition of Care [facility]) - Progression Note    Patient Details  Name: [name] [name] MRN: [id number] Date of Birth: March 19, 1935  Transition of Care [facility]) CM/SW Contact  Darleene Cleaver, [place] Phone Number: 12/05/2021, 10:35 AM  Clinical Narrative:     CSW spoke to [place] at [facility] [place] to confirm equipment is ready for patient to discharge today.  Delorise Shiner said she will call CSW back after she confirms it.  11:10am  CSW received confirmation that patient can discharge today.  Expected Discharge Plan: Skilled Nursing Facility Barriers to Discharge: Barriers Resolved  Expected Discharge Plan and Services Expected Discharge Plan: Skilled Nursing Facility         Expected Discharge Date: 12/05/21                                     Social Determinants of Health (SDOH) Interventions    Readmission Risk Interventions Readmission Risk Prevention Plan 10/22/2021  Transportation Screening Complete  Medication Review [facility]) Referral to Pharmacy  PCP or Specialist appointment within 3-5 days of discharge Complete  HRI or Home Care Consult Not Complete  HRI or Home Care Consult Pt Refusal Comments from SNF  SW Recovery Care/Counseling Consult Complete  Palliative Care Screening Not Applicable  Skilled Nursing Facility Complete  Some recent data might be hidden

## 2021-12-06 ENCOUNTER — Emergency Department (HOSPITAL_COMMUNITY): Payer: Medicare Other

## 2021-12-06 ENCOUNTER — Inpatient Hospital Stay (HOSPITAL_COMMUNITY)
Admission: EM | Admit: 2021-12-06 | Discharge: 2021-12-10 | DRG: 871 | Disposition: A | Discharge status: Hospice | Payer: Medicare Other | Attending: Internal Medicine | Admitting: Internal Medicine

## 2021-12-06 ENCOUNTER — Encounter (HOSPITAL_COMMUNITY): Payer: Self-pay

## 2021-12-06 ENCOUNTER — Other Ambulatory Visit: Payer: Self-pay

## 2021-12-06 ENCOUNTER — Inpatient Hospital Stay (HOSPITAL_COMMUNITY): Payer: Medicare Other

## 2021-12-06 DIAGNOSIS — G40909 Epilepsy, unspecified, not intractable, without status epilepticus: Secondary | ICD-10-CM | POA: Diagnosis present

## 2021-12-06 DIAGNOSIS — L89153 Pressure ulcer of sacral region, stage 3: Secondary | ICD-10-CM | POA: Diagnosis present

## 2021-12-06 DIAGNOSIS — E871 Hypo-osmolality and hyponatremia: Secondary | ICD-10-CM | POA: Diagnosis present

## 2021-12-06 DIAGNOSIS — Z91018 Allergy to other foods: Secondary | ICD-10-CM

## 2021-12-06 DIAGNOSIS — K5730 Diverticulosis of large intestine without perforation or abscess without bleeding: Secondary | ICD-10-CM | POA: Diagnosis present

## 2021-12-06 DIAGNOSIS — Z6822 Body mass index (BMI) 22.0-22.9, adult: Secondary | ICD-10-CM

## 2021-12-06 DIAGNOSIS — F0390 Unspecified dementia without behavioral disturbance: Secondary | ICD-10-CM | POA: Diagnosis present

## 2021-12-06 DIAGNOSIS — G9341 Metabolic encephalopathy: Secondary | ICD-10-CM | POA: Diagnosis present

## 2021-12-06 DIAGNOSIS — E440 Moderate protein-calorie malnutrition: Secondary | ICD-10-CM | POA: Diagnosis present

## 2021-12-06 DIAGNOSIS — L89620 Pressure ulcer of left heel, unstageable: Secondary | ICD-10-CM | POA: Diagnosis present

## 2021-12-06 DIAGNOSIS — Z881 Allergy status to other antibiotic agents status: Secondary | ICD-10-CM

## 2021-12-06 DIAGNOSIS — Z8616 Personal history of COVID-19: Secondary | ICD-10-CM

## 2021-12-06 DIAGNOSIS — Z79899 Other long term (current) drug therapy: Secondary | ICD-10-CM

## 2021-12-06 DIAGNOSIS — I10 Essential (primary) hypertension: Secondary | ICD-10-CM | POA: Diagnosis present

## 2021-12-06 DIAGNOSIS — N400 Enlarged prostate without lower urinary tract symptoms: Secondary | ICD-10-CM | POA: Diagnosis present

## 2021-12-06 DIAGNOSIS — D638 Anemia in other chronic diseases classified elsewhere: Secondary | ICD-10-CM | POA: Diagnosis present

## 2021-12-06 DIAGNOSIS — Z79890 Hormone replacement therapy (postmenopausal): Secondary | ICD-10-CM | POA: Diagnosis not present

## 2021-12-06 DIAGNOSIS — E785 Hyperlipidemia, unspecified: Secondary | ICD-10-CM | POA: Diagnosis present

## 2021-12-06 DIAGNOSIS — E861 Hypovolemia: Secondary | ICD-10-CM | POA: Diagnosis present

## 2021-12-06 DIAGNOSIS — Z888 Allergy status to other drugs, medicaments and biological substances status: Secondary | ICD-10-CM

## 2021-12-06 DIAGNOSIS — L89610 Pressure ulcer of right heel, unstageable: Secondary | ICD-10-CM | POA: Diagnosis present

## 2021-12-06 DIAGNOSIS — Z515 Encounter for palliative care: Secondary | ICD-10-CM | POA: Diagnosis not present

## 2021-12-06 DIAGNOSIS — J690 Pneumonitis due to inhalation of food and vomit: Secondary | ICD-10-CM | POA: Diagnosis present

## 2021-12-06 DIAGNOSIS — R1310 Dysphagia, unspecified: Secondary | ICD-10-CM | POA: Diagnosis present

## 2021-12-06 DIAGNOSIS — R0989 Other specified symptoms and signs involving the circulatory and respiratory systems: Secondary | ICD-10-CM

## 2021-12-06 DIAGNOSIS — Z8249 Family history of ischemic heart disease and other diseases of the circulatory system: Secondary | ICD-10-CM

## 2021-12-06 DIAGNOSIS — Z931 Gastrostomy status: Secondary | ICD-10-CM | POA: Diagnosis not present

## 2021-12-06 DIAGNOSIS — Z87891 Personal history of nicotine dependence: Secondary | ICD-10-CM

## 2021-12-06 DIAGNOSIS — Z20822 Contact with and (suspected) exposure to covid-19: Secondary | ICD-10-CM | POA: Diagnosis present

## 2021-12-06 DIAGNOSIS — F03C0 Unspecified dementia, severe, without behavioral disturbance, psychotic disturbance, mood disturbance, and anxiety: Secondary | ICD-10-CM | POA: Diagnosis not present

## 2021-12-06 DIAGNOSIS — A419 Sepsis, unspecified organism: Secondary | ICD-10-CM | POA: Diagnosis present

## 2021-12-06 DIAGNOSIS — Z66 Do not resuscitate: Secondary | ICD-10-CM | POA: Diagnosis not present

## 2021-12-06 DIAGNOSIS — J189 Pneumonia, unspecified organism: Secondary | ICD-10-CM | POA: Diagnosis present

## 2021-12-06 DIAGNOSIS — Z7189 Other specified counseling: Secondary | ICD-10-CM | POA: Diagnosis not present

## 2021-12-06 LAB — URINALYSIS, ROUTINE W REFLEX MICROSCOPIC
Bilirubin Urine: NEGATIVE
Glucose, UA: NEGATIVE mg/dL
Hgb urine dipstick: NEGATIVE
Ketones, ur: 5 mg/dL — AB
Nitrite: NEGATIVE
Protein, ur: NEGATIVE mg/dL
Specific Gravity, Urine: 1.025 (ref 1.005–1.030)
WBC, UA: >50 WBC/hpf — ABNORMAL HIGH (ref 0–5)
pH: 6 (ref 5.0–8.0)

## 2021-12-06 LAB — COMPREHENSIVE METABOLIC PANEL
ALT: 19 U/L (ref 0–44)
AST: 23 U/L (ref 15–41)
Albumin: 2.8 g/dL — ABNORMAL LOW (ref 3.5–5.0)
Alkaline Phosphatase: 70 U/L (ref 38–126)
Anion gap: 9 (ref 5–15)
BUN: 27 mg/dL — ABNORMAL HIGH (ref 8–23)
CO2: 27 mmol/L (ref 22–32)
Calcium: 9.1 mg/dL (ref 8.9–10.3)
Chloride: 98 mmol/L (ref 98–111)
Creatinine, Ser: 0.82 mg/dL (ref 0.61–1.24)
GFR, Estimated: >60 mL/min (ref >60)
Glucose, Bld: 144 mg/dL — ABNORMAL HIGH (ref 70–99)
Potassium: 4.7 mmol/L (ref 3.5–5.1)
Sodium: 134 mmol/L — ABNORMAL LOW (ref 135–145)
Total Bilirubin: 0.5 mg/dL (ref 0.3–1.2)
Total Protein: 6.8 g/dL (ref 6.5–8.1)

## 2021-12-06 LAB — CBC WITH DIFFERENTIAL/PLATELET
Abs Immature Granulocytes: 0.07 10*3/uL (ref 0.00–0.07)
Basophils Absolute: 0 10*3/uL (ref 0.0–0.1)
Basophils Relative: 1 %
Eosinophils Absolute: 0 10*3/uL (ref 0.0–0.5)
Eosinophils Relative: 1 %
HCT: 41 % (ref 39.0–52.0)
Hemoglobin: 12.8 g/dL — ABNORMAL LOW (ref 13.0–17.0)
Immature Granulocytes: 1 %
Lymphocytes Relative: 6 %
Lymphs Abs: 0.6 10*3/uL — ABNORMAL LOW (ref 0.7–4.0)
MCH: 26.7 pg (ref 26.0–34.0)
MCHC: 31.2 g/dL (ref 30.0–36.0)
MCV: 85.4 fL (ref 80.0–100.0)
Monocytes Absolute: 0.7 10*3/uL (ref 0.1–1.0)
Monocytes Relative: 8 %
Neutro Abs: 7.3 10*3/uL (ref 1.7–7.7)
Neutrophils Relative %: 83 %
Platelets: 620 10*3/uL — ABNORMAL HIGH (ref 150–400)
RBC: 4.8 MIL/uL (ref 4.22–5.81)
RDW: 14.6 % (ref 11.5–15.5)
WBC: 8.7 10*3/uL (ref 4.0–10.5)
nRBC: 0 % (ref 0.0–0.2)

## 2021-12-06 LAB — AMMONIA: Ammonia: 26 umol/L (ref 9–35)

## 2021-12-06 LAB — RESP PANEL BY RT-PCR (FLU A&B, COVID) ARPGX2
Influenza A by PCR: NEGATIVE
Influenza B by PCR: NEGATIVE
SARS Coronavirus 2 by RT PCR: NEGATIVE

## 2021-12-06 LAB — MAGNESIUM: Magnesium: 2 mg/dL (ref 1.7–2.4)

## 2021-12-06 LAB — LACTIC ACID, PLASMA
Lactic Acid, Venous: 1.7 mmol/L (ref 0.5–1.9)
Lactic Acid, Venous: 1 mmol/L (ref 0.5–1.9)

## 2021-12-06 LAB — VALPROIC ACID LEVEL: Valproic Acid Lvl: 25 ug/mL — ABNORMAL LOW (ref 50.0–100.0)

## 2021-12-06 LAB — BRAIN NATRIURETIC PEPTIDE: B Natriuretic Peptide: 80.1 pg/mL (ref 0.0–100.0)

## 2021-12-06 MED ORDER — BRINZOLAMIDE 1 % OP SUSP
1.0000 [drp] | Freq: Three times a day (TID) | OPHTHALMIC | Status: DC
  Administered 2021-12-06 – 2021-12-10 (×12): 1 [drp] via OPHTHALMIC
  Filled 2021-12-06: qty 10

## 2021-12-06 MED ORDER — JEVITY 1.2 CAL PO LIQD: 1000.0000 mL | ORAL | Status: DC

## 2021-12-06 MED ORDER — SODIUM CHLORIDE 0.9 % IV BOLUS
250.0000 mL | Freq: Once | INTRAVENOUS | Status: AC
  Administered 2021-12-06: 250 mL via INTRAVENOUS

## 2021-12-06 MED ORDER — ADULT MULTIVITAMIN LIQUID CH
5.0000 mL | Freq: Every day | ORAL | Status: DC
  Administered 2021-12-07 – 2021-12-10 (×4): 5 mL
  Filled 2021-12-06 (×4): qty 15

## 2021-12-06 MED ORDER — ACETAMINOPHEN 650 MG RE SUPP
650.0000 mg | Freq: Once | RECTAL | Status: AC
  Administered 2021-12-06: 650 mg via RECTAL
  Filled 2021-12-06: qty 1

## 2021-12-06 MED ORDER — ENOXAPARIN SODIUM 40 MG/0.4ML IJ SOSY
40.0000 mg | PREFILLED_SYRINGE | INTRAMUSCULAR | Status: DC
  Administered 2021-12-06 – 2021-12-10 (×5): 40 mg via SUBCUTANEOUS
  Filled 2021-12-06 (×5): qty 0.4

## 2021-12-06 MED ORDER — VALPROIC ACID 250 MG/5ML PO SOLN
1000.0000 mg | Freq: Every day | ORAL | Status: DC
  Administered 2021-12-07 – 2021-12-10 (×4): 1000 mg
  Filled 2021-12-06 (×4): qty 20

## 2021-12-06 MED ORDER — MEMANTINE HCL 10 MG PO TABS
10.0000 mg | ORAL_TABLET | Freq: Every day | ORAL | Status: DC
Start: 2021-12-06 — End: 2021-12-11
  Administered 2021-12-06 – 2021-12-09 (×4): 10 mg
  Filled 2021-12-06 (×4): qty 1

## 2021-12-06 MED ORDER — MELATONIN 5 MG PO TABS
10.0000 mg | ORAL_TABLET | Freq: Every day | ORAL | Status: DC
  Administered 2021-12-09: 10 mg
  Filled 2021-12-06: qty 2

## 2021-12-06 MED ORDER — ACETAMINOPHEN 650 MG RE SUPP: 650.0000 mg | Freq: Four times a day (QID) | RECTAL | Status: DC | PRN

## 2021-12-06 MED ORDER — LEVETIRACETAM 100 MG/ML PO SOLN
500.0000 mg | Freq: Two times a day (BID) | ORAL | Status: DC
  Administered 2021-12-06 – 2021-12-10 (×8): 500 mg
  Filled 2021-12-06 (×9): qty 5

## 2021-12-06 MED ORDER — SODIUM CHLORIDE 0.9 % IV SOLN: INTRAVENOUS | Status: DC

## 2021-12-06 MED ORDER — LATANOPROST 0.005 % OP SOLN
2.0000 [drp] | Freq: Every day | OPHTHALMIC | Status: DC
  Administered 2021-12-06 – 2021-12-09 (×4): 2 [drp] via OPHTHALMIC
  Filled 2021-12-06: qty 2.5

## 2021-12-06 MED ORDER — VANCOMYCIN HCL 1500 MG/300ML IV SOLN
1500.0000 mg | Freq: Once | INTRAVENOUS | Status: AC
  Administered 2021-12-06: 1500 mg via INTRAVENOUS
  Filled 2021-12-06: qty 300

## 2021-12-06 MED ORDER — JEVITY 1.2 CAL PO LIQD
1000.0000 mL | ORAL | Status: DC
Start: 2021-12-06 — End: 2021-12-07
  Administered 2021-12-06 – 2021-12-07 (×2): 1000 mL
  Filled 2021-12-06 (×2): qty 1000

## 2021-12-06 MED ORDER — ACETAMINOPHEN 325 MG PO TABS
650.0000 mg | ORAL_TABLET | Freq: Four times a day (QID) | ORAL | Status: DC | PRN
  Administered 2021-12-06 – 2021-12-08 (×4): 650 mg
  Filled 2021-12-06 (×4): qty 2

## 2021-12-06 MED ORDER — SODIUM CHLORIDE 0.9 % IV SOLN
2.0000 g | Freq: Three times a day (TID) | INTRAVENOUS | Status: DC
  Administered 2021-12-06 – 2021-12-07 (×2): 2 g via INTRAVENOUS
  Filled 2021-12-06 (×3): qty 2

## 2021-12-06 MED ORDER — VALPROIC ACID 250 MG/5ML PO SOLN: 500.0000 mg | ORAL | Status: DC

## 2021-12-06 MED ORDER — GUAIFENESIN 100 MG/5ML PO LIQD
15.0000 mL | ORAL | Status: DC | PRN
  Administered 2021-12-06 – 2021-12-10 (×6): 15 mL
  Filled 2021-12-06 (×7): qty 20

## 2021-12-06 MED ORDER — NETARSUDIL DIMESYLATE 0.02 % OP SOLN
1.0000 [drp] | Freq: Every day | OPHTHALMIC | Status: DC
Start: 2021-12-06 — End: 2021-12-11

## 2021-12-06 MED ORDER — VANCOMYCIN HCL 1500 MG/300ML IV SOLN
1500.0000 mg | INTRAVENOUS | Status: DC
  Filled 2021-12-06: qty 300

## 2021-12-06 MED ORDER — ASCORBIC ACID 500 MG PO TABS
250.0000 mg | ORAL_TABLET | Freq: Every day | ORAL | Status: DC
  Administered 2021-12-06 – 2021-12-10 (×5): 250 mg
  Filled 2021-12-06 (×5): qty 1

## 2021-12-06 MED ORDER — PROSOURCE TF PO LIQD
45.0000 mL | Freq: Every day | ORAL | Status: DC
  Administered 2021-12-07 – 2021-12-10 (×4): 45 mL
  Filled 2021-12-06 (×4): qty 45

## 2021-12-06 MED ORDER — SODIUM CHLORIDE 0.9 % IV SOLN
2.0000 g | Freq: Once | INTRAVENOUS | Status: AC
  Administered 2021-12-06: 2 g via INTRAVENOUS
  Filled 2021-12-06: qty 2

## 2021-12-06 MED ORDER — ELDERTONIC PO LIQD: 5.0000 mL | Freq: Every day | ORAL | Status: DC

## 2021-12-06 MED ORDER — ZINC OXIDE 20 % EX OINT
1.0000 | TOPICAL_OINTMENT | Freq: Two times a day (BID) | CUTANEOUS | Status: DC
Start: 2021-12-06 — End: 2021-12-10
  Administered 2021-12-06 – 2021-12-10 (×8): 1 via TOPICAL
  Filled 2021-12-06: qty 28.35

## 2021-12-06 MED ORDER — FREE WATER
150.0000 mL | Freq: Four times a day (QID) | Status: DC
  Administered 2021-12-06 – 2021-12-10 (×17): 150 mL

## 2021-12-06 MED ORDER — VALPROIC ACID 250 MG/5ML PO SOLN
500.0000 mg | Freq: Every day | ORAL | Status: DC
  Administered 2021-12-06 – 2021-12-09 (×4): 500 mg
  Filled 2021-12-06 (×5): qty 10

## 2021-12-06 NOTE — ED Triage Notes (Signed)
Pt BIB [facility] from [place].  [facility] states the pt came yesterday and has been difficulty breathing, Facility states they have been suctioning  thick yellow secretions all night. Patient was placed on a NRB. Patient has noticeable edema in bilateral upper extremities. Patients sats are in the 90s after suctioning.

## 2021-12-06 NOTE — Plan of Care (Incomplete)
Pt responding only to pain at beginning of shift.  Soft BP's.  Responded to name around 2300.

## 2021-12-06 NOTE — ED Provider Notes (Signed)
[facility] DEPT Provider Note   CSN: [id number] Arrival date & time: 12/06/21  [id number]     History No chief complaint on file.   [name] [name] is a 86 y.o. male.  86 year old male presents for nursing home with altered mental status.  Was just discharged from the hospital yesterday after prolonged admission for encephalopathy.  Patient found today to have low saturations as well as to be more altered.  He has baseline history of dementia.  Recently was treated for COVID-pneumonia as well as possible aspiration pneumonia.  Patient suctioned and placed on nonrebreather with improvement of his O2 sats and transported here.      Past Medical History:  Diagnosis Date   Allergy    Dementia [facility])    sees Dr. Ellouise Newer    Diverticulosis of colon (without mention of hemorrhage) 03-01-2004, 04-04-2011   Colonoscopy   ED (erectile dysfunction)    Glaucoma    sees Dr. Crecencio Mc    Hyperglycemia    Hyperlipidemia    Hypertension    Internal hemorrhoids 03-15-1999   Flex Sig    Irritable bowel     Patient Active Problem List   Diagnosis Date Noted   Acute metabolic encephalopathy [id number]   Acute encephalopathy 11/18/2021   Recurrent pneumonia 10/20/2021   Pressure injury of skin 05/11/2021   Pericardial effusion, acute    COVID    Palliative care by specialist    Goals of care, counseling/discussion    General weakness    Syncope 05/07/2021   Seizure ([place]) 03/09/2021   Dementia ([place]) 02/25/2021   BPH with urinary obstruction 12/22/2020   Ankle edema, bilateral 07/13/2020   Hallucinations 02/09/2020   Abdominal pain, acute 08/21/2014   GI bleeding 05/26/2012   Renal mass 05/26/2012   Diverticulosis of colon with hemorrhage 05/26/2012   SHOULDER PAIN, BILATERAL 07/30/2010   HERPES SIMPLEX INFECTION 10/10/2007   TACHYCARDIA, PAROXYSMAL NOS 10/10/2007   Dyslipidemia 07/11/2007   Essential hypertension 07/11/2007   ALLERGIC RHINITIS  07/11/2007    Past Surgical History:  Procedure Laterality Date   COLONOSCOPY  04-04-11   per Dr. Sharlett Iles, clear, no repeats needed   EYE SURGERY     bilateral cataract extraction per Dr Dolores Lory   INGUINAL HERNIA REPAIR     IR GASTROSTOMY TUBE MOD SED  11/26/2021   KNEE ARTHROSCOPY     right knee   NASAL SINUS SURGERY         Family History  Problem Relation Age of Onset   Hypertension Mother    Lung cancer Father    Colitis Daughter     Social History   Tobacco Use   Smoking status: Former    Types: Cigarettes    Quit date: 03/20/1969    Years since quitting: 52.7   Smokeless tobacco: Never  Vaping Use   Vaping Use: Never used  Substance Use Topics   Alcohol use: Yes    Alcohol/week: 0.0 standard drinks    Comment: occ   Drug use: No    Home Medications Prior to Admission medications   Medication Sig Start Date End Date Taking? Authorizing Provider  acetaminophen (TYLENOL) 500 MG tablet Place 1 tablet (500 mg total) into feeding tube every 8 (eight) hours as needed. 11/30/21   Shelly Coss, MD  AZOPT 1 % ophthalmic suspension Place 1 drop into both eyes 3 (three) times daily. 12/18/19   [provider]  donepezil (ARICEPT) 10 MG tablet Place 1 tablet (  10 mg total) into feeding tube at bedtime. 11/30/21   [name], MD  fish oil-omega-3 fatty acids 1000 MG capsule Place 1 capsule (1 g total) into feeding tube daily. 11/30/21   [name], MD  Glucosamine-Chondroitin 500-400 MG CAPS 1 tablet by PEG Tube route daily. 11/30/21   [name], MD  guaiFENesin (ROBITUSSIN) 100 MG/5ML liquid Place 15 mLs into feeding tube every 4 (four) hours as needed for cough or to loosen phlegm. 11/30/21   [name], MD  latanoprost (XALATAN) 0.005 % ophthalmic solution Place 1 drop into both eyes at bedtime.    [provider]  levETIRAcetam (KEPPRA) 100 MG/ML solution Place 5 mLs (500 mg total) into feeding tube 2 (two) times daily. 11/30/21    [name], MD  melatonin 5 MG TABS Place 2 tablets (10 mg total) into feeding tube at bedtime. 11/30/21   [name], MD  memantine (NAMENDA) 10 MG tablet Place 1 tablet (10 mg total) into feeding tube at bedtime. 11/30/21   [name], MD  Multiple Vitamins-Minerals (MULTIVITAMIN WITH MINERALS) tablet Place 1 tablet into feeding tube daily. 11/30/21   [name], MD  Nutritional Supplements (FEEDING SUPPLEMENT, JEVITY 1.2 CAL,) LIQD Place 1,000 mLs into feeding tube continuous. 11/30/21   [name], MD  Nutritional Supplements (FEEDING SUPPLEMENT, PROSOURCE TF,) liquid Place 45 mLs into feeding tube daily. 12/01/21   [name], MD  RHOPRESSA 0.02 % SOLN Place 1 drop into both eyes at bedtime. 08/17/19   [provider]  valproic acid (DEPAKENE) 250 MG/5ML solution Place 20 mLs (1,000 mg total) into feeding tube daily. 12/01/21   [name], MD  valproic acid (DEPAKENE) 250 MG/5ML solution Place 10 mLs (500 mg total) into feeding tube at bedtime. 11/30/21   [name], MD  vitamin C (ASCORBIC ACID) 250 MG tablet Place 1 tablet (250 mg total) into feeding tube daily. 11/30/21   [name], MD  Water For Irrigation, Sterile (FREE WATER) SOLN Place 150 mLs into feeding tube every 6 (six) hours. 11/30/21   [name], MD  zinc oxide 20 % ointment Apply topically 2 (two) times daily. 11/30/21   [name], MD    Allergies    Chocolate, Ciprofloxacin, and Lipitor [atorvastatin]  Review of Systems   Review of Systems  Unable to perform ROS: Acuity of condition   Physical Exam Updated Vital Signs There were no vitals taken for this visit.  Physical Exam Vitals and nursing note reviewed.  Constitutional:      General: He is not in acute distress.    Appearance: Normal appearance. He is well-developed. He is not toxic-appearing.  HENT:     Head: Normocephalic and atraumatic.  Eyes:     General: Lids are normal.     Conjunctiva/sclera:  Conjunctivae normal.     Pupils: Pupils are equal, round, and reactive to light.  Neck:     Thyroid: No thyroid mass.     Trachea: No tracheal deviation.  Cardiovascular:     Rate and Rhythm: Regular rhythm. Tachycardia present.     Heart sounds: Normal heart sounds. No murmur heard.   No gallop.  Pulmonary:     Effort: Pulmonary effort is normal. No respiratory distress.     Breath sounds: Normal breath sounds. No stridor. No decreased breath sounds, wheezing, rhonchi or rales.  Abdominal:     General: There is no distension.     Palpations: Abdomen is soft.     Tenderness: There is no  abdominal tenderness. There is no rebound.  Musculoskeletal:        General: No tenderness. Normal range of motion.     Cervical back: Normal range of motion and neck supple.  Skin:    General: Skin is warm and dry.     Findings: No abrasion or rash.  Neurological:     Mental Status: He is lethargic and disoriented.     Cranial Nerves: Cranial nerve deficit present.     Sensory: No sensory deficit.     Comments: Patient uncooperative with exam.  Does withdraw to pain in all 4 extremities  Psychiatric:        Attention and Perception: He is inattentive.    ED Results / Procedures / Treatments   Labs (all labs ordered are listed, but only abnormal results are displayed) Labs Reviewed  CULTURE, BLOOD (ROUTINE X 2)  CULTURE, BLOOD (ROUTINE X 2)  RESP PANEL BY RT-PCR (FLU A&B, COVID) ARPGX2  CBC WITH DIFFERENTIAL/PLATELET  COMPREHENSIVE METABOLIC PANEL  LACTIC ACID, PLASMA  URINALYSIS, ROUTINE W REFLEX MICROSCOPIC  BRAIN NATRIURETIC PEPTIDE  VALPROIC ACID LEVEL  AMMONIA    EKG EKG Interpretation  Date/Time:  Monday December 06 2021 09:02:57 EST Ventricular Rate:  122 PR Interval:    QRS Duration: 149 QT Interval:  326 QTC Calculation: 465 R Axis:   -85 Text Interpretation: Atrial flutter with predominant 2:1 AV block RBBB and LAFB Confirmed by Lacretia Leigh ([id number]) on 12/06/2021  10:51:13 AM  Radiology No results found.  Procedures Procedures   Medications Ordered in ED Medications  0.9 %  sodium chloride infusion (has no administration in time range)    ED Course  I have reviewed the triage vital signs and the nursing notes.  Pertinent labs & imaging results that were available during my care of the patient were reviewed by me and considered in my medical decision making (see chart for details).    MDM Rules/Calculators/A&P                           Patient has x-ray consistent with pneumonia.  We placed on IV antibiotics and admitted back to the hospital Final Clinical Impression(s) / ED Diagnoses Final diagnoses:  None    Rx / DC Orders ED Discharge Orders     None        Lacretia Leigh, MD 12/06/21 1051

## 2021-12-06 NOTE — Progress Notes (Signed)
A consult was received from an ED physician for Vancomycin per pharmacy dosing.  The patient's profile has been reviewed for ht/wt/allergies/indication/available labs.    A one time order has been placed for Vancomycin 1500mg , Cefepime 2gm x1 per MD.  Further antibiotics/pharmacy consults should be ordered by admitting physician if indicated.                       Thank you,  PharmD 12/06/2021  10:42 AM

## 2021-12-06 NOTE — H&P (Signed)
History and Physical    [name] [name] [id number] DOB: 1935/08/06 DOA: 12/06/2021  PCP: [name] DupesBlake, Khashana A, MD  [facility]  Chief Complaint: dyspnea  HPI: [name] BenceJoe L [name] is [name] 86 y.o. male with medical history significant of dementia, seizures, BPH. Presenting with dyspnea. History is from wife at bedside and chart review. He was just discharged from the hospital for recurrent aspiration PNA. He was treated and returned to his facility. Reports are that he was increasingly confused overnight. He was having increased need for suctioning. He was found to by hypoxic. EMS was called to bring the patient back to the hospital.   ED Course: CXR showed PNA. He was started on vanc, cefepime. TRH was called for admission.  Review of Systems:  Unable to obtain d/t mentation.   PMHx Past Medical History:  Diagnosis Date   Allergy    Dementia [facility](HCC)    sees Dr. Patrcia DollyKaren Aquino    Diverticulosis of colon (without mention of hemorrhage) 03-01-2004, 04-04-2011   Colonoscopy   ED (erectile dysfunction)    Glaucoma    sees Dr. Arvil Chacooy Whittaker    Hyperglycemia    Hyperlipidemia    Hypertension    Internal hemorrhoids 03-15-1999   Flex Sig    Irritable bowel     PSHx Past Surgical History:  Procedure Laterality Date   COLONOSCOPY  04-04-11   per Dr. Jarold MottoPatterson, clear, no repeats needed   EYE SURGERY     bilateral cataract extraction per Dr Jettie PaganEpps   INGUINAL HERNIA REPAIR     IR GASTROSTOMY TUBE MOD SED  11/26/2021   KNEE ARTHROSCOPY     right knee   NASAL SINUS SURGERY      SocHx  reports that he quit smoking about 52 years ago. He has never used smokeless tobacco. He reports current alcohol use. He reports that he does not use drugs.  Allergies  Allergen Reactions   Chocolate     Itching if he eats too much chocolate    Ciprofloxacin     Achilles tendon pain    Lipitor [Atorvastatin]     Muscle aches     FamHx Family History  Problem Relation Age of Onset    Hypertension Mother    Lung cancer Father    Colitis Daughter     Prior to Admission medications   Medication Sig Start Date End Date Taking? Authorizing Provider  acetaminophen (TYLENOL) 500 MG tablet Place 1 tablet (500 mg total) into feeding tube every 8 (eight) hours as needed. 11/30/21   Burnadette PopAdhikari, Amrit, MD  AZOPT 1 % ophthalmic suspension Place 1 drop into both eyes 3 (three) times daily. 12/18/19   [provider]  donepezil (ARICEPT) 10 MG tablet Place 1 tablet (10 mg total) into feeding tube at bedtime. 11/30/21   Burnadette PopAdhikari, Amrit, MD  fish oil-omega-3 fatty acids 1000 MG capsule Place 1 capsule (1 g total) into feeding tube daily. 11/30/21   Burnadette PopAdhikari, Amrit, MD  Glucosamine-Chondroitin 500-400 MG CAPS 1 tablet by PEG Tube route daily. 11/30/21   Burnadette PopAdhikari, Amrit, MD  guaiFENesin (ROBITUSSIN) 100 MG/5ML liquid Place 15 mLs into feeding tube every 4 (four) hours as needed for cough or to loosen phlegm. 11/30/21   Burnadette PopAdhikari, Amrit, MD  latanoprost (XALATAN) 0.005 % ophthalmic solution Place 1 drop into both eyes at bedtime.    [provider]  levETIRAcetam (KEPPRA) 100 MG/ML solution Place 5 mLs (500 mg total) into feeding tube 2 (two) times daily.  11/30/21   [name], MD  melatonin 5 MG TABS Place 2 tablets (10 mg total) into feeding tube at bedtime. 11/30/21   [name], MD  memantine (NAMENDA) 10 MG tablet Place 1 tablet (10 mg total) into feeding tube at bedtime. 11/30/21   [name], MD  Multiple Vitamins-Minerals (MULTIVITAMIN WITH MINERALS) tablet Place 1 tablet into feeding tube daily. 11/30/21   [name], MD  Nutritional Supplements (FEEDING SUPPLEMENT, JEVITY 1.2 CAL,) LIQD Place 1,000 mLs into feeding tube continuous. 11/30/21   [name], MD  Nutritional Supplements (FEEDING SUPPLEMENT, PROSOURCE TF,) liquid Place 45 mLs into feeding tube daily. 12/01/21   [name], MD  RHOPRESSA 0.02 % SOLN Place 1 drop into both eyes at bedtime.  08/17/19   [provider]  valproic acid (DEPAKENE) 250 MG/5ML solution Place 20 mLs (1,000 mg total) into feeding tube daily. 12/01/21   [name], MD  valproic acid (DEPAKENE) 250 MG/5ML solution Place 10 mLs (500 mg total) into feeding tube at bedtime. 11/30/21   [name], MD  vitamin C (ASCORBIC ACID) 250 MG tablet Place 1 tablet (250 mg total) into feeding tube daily. 11/30/21   [name], MD  Water For Irrigation, Sterile (FREE WATER) SOLN Place 150 mLs into feeding tube every 6 (six) hours. 11/30/21   [name], MD  zinc oxide 20 % ointment Apply topically 2 (two) times daily. 11/30/21   [name], MD    Physical Exam: Vitals:   12/06/21 0947 12/06/21 0950 12/06/21 1030 12/06/21 1100  BP:  105/70 101/67 134/83  Pulse:  (!) 115 (!) 112 (!) 113  Resp:  (!) 37 (!) 36 (!) 36  Temp: (!) 101 F (38.3 C)     TempSrc: Rectal     SpO2:  100% 98% 100%    General: 86 y.o. ill appearing male resting in bed Eyes: PERRL, normal sclera ENMT: Nares patent w/o discharge, orophaynx clear, dentition poor, ears w/o discharge/lesions/ulcers Cardiovascular: tachy +S1, S2, no m/g/r, equal pulses throughout Respiratory: diffuse rhonchi, increased WOB on NRB GI: BS+, NDNT, no masses noted, PEG in place MSK: No e/c/c Neuro: responsive to noxious stimuli, not following commands  Labs on Admission: I have personally reviewed following labs and imaging studies  CBC: Recent Labs  Lab 12/02/21 0512 12/04/21 0421 12/06/21 0900  WBC 4.3 4.0 8.7  NEUTROABS  --   --  7.3  HGB 11.3* 12.3* 12.8*  HCT 36.1* 39.3 41.0  MCV 87.6 84.5 85.4  PLT 312 432* 620*   Basic Metabolic Panel: Recent Labs  Lab 12/02/21 0512 12/04/21 0421 12/06/21 0900  NA 136 137 134*  K 4.3 4.3 4.7  CL 102 100 98  CO2 29 29 27   GLUCOSE 112* 114* 144*  BUN 15 17 27*  CREATININE 0.47* 0.44* 0.82  CALCIUM 8.5* 9.1 9.1   GFR: Estimated Creatinine Clearance: 75.8 mL/min (by C-G formula  based on SCr of 0.82 mg/dL). Liver Function Tests: Recent Labs  Lab 12/06/21 0900  AST 23  ALT 19  ALKPHOS 70  BILITOT 0.5  PROT 6.8  ALBUMIN 2.8*   No results for input(s): LIPASE, AMYLASE in the last 168 hours. Recent Labs  Lab 12/06/21 0931  AMMONIA 26   Coagulation Profile: No results for input(s): INR, PROTIME in the last 168 hours. Cardiac Enzymes: No results for input(s): CKTOTAL, CKMB, CKMBINDEX, TROPONINI in the last 168 hours. BNP (last 3 results) No results for input(s): PROBNP in the last 8760 hours. HbA1C: No  results for input(s): HGBA1C in the last 72 hours. CBG: Recent Labs  Lab 12/04/21 2003 12/04/21 2341 12/05/21 0430 12/05/21 0721 12/05/21 1148  GLUCAP 119* 115* 111* 100* 141*   Lipid Profile: No results for input(s): CHOL, HDL, LDLCALC, TRIG, CHOLHDL, LDLDIRECT in the last 72 hours. Thyroid Function Tests: No results for input(s): TSH, T4TOTAL, FREET4, T3FREE, THYROIDAB in the last 72 hours. Anemia Panel: No results for input(s): VITAMINB12, FOLATE, FERRITIN, TIBC, IRON, RETICCTPCT in the last 72 hours. Urine analysis:    Component Value Date/Time   COLORURINE YELLOW 12/06/2021 0929   APPEARANCEUR HAZY ([name]) 12/06/2021 0929   LABSPEC 1.025 12/06/2021 0929   PHURINE 6.0 12/06/2021 0929   GLUCOSEU NEGATIVE 12/06/2021 0929   GLUCOSEU NEGATIVE 02/06/2020 1032   HGBUR NEGATIVE 12/06/2021 0929   HGBUR negative 09/16/2010 0839   BILIRUBINUR NEGATIVE 12/06/2021 0929   BILIRUBINUR neg 02/22/2019 0958   KETONESUR 5 ([name]) 12/06/2021 0929   PROTEINUR NEGATIVE 12/06/2021 0929   UROBILINOGEN 0.2 02/06/2020 1032   NITRITE NEGATIVE 12/06/2021 0929   LEUKOCYTESUR SMALL ([name]) 12/06/2021 0929    Radiological Exams on Admission: DG Chest Port 1 View  Result Date: 12/06/2021 CLINICAL DATA:  Cough, increased secretions EXAM: PORTABLE CHEST 1 VIEW COMPARISON:  11/18/2021 FINDINGS: Stable cardiomediastinal contours. Patchy bibasilar airspace opacities, right  worse than left. Possible trace bilateral pleural effusions. No pneumothorax. IMPRESSION: Patchy bibasilar airspace opacities, right worse than left, suspicious for pneumonia. Electronically Signed   By: Davina Poke D.O.   On: 12/06/2021 10:25    Assessment/Plan Sepsis secondary to BLL PNA     - admit to inpt, progressive     - continue vanc, cefepime for now; check MRSA     - nebs     - wean O2 as able     - COVID/flu negative     Acute on chronic metabolic encephalopathy     - likely secondary to above, but will also check CTH  Dysphagia     - PEG in place, continue TF  Dementia     - resume home regimen  Seizures     - resume home regimen  Goals of care     - patient is now DNI     - PC consult  DVT prophylaxis: lovenox  Code Status: DNI, confirmed with wife  Family Communication: w/ wife at bedside  Consults called: None   Status is: Inpatient  Remains inpatient appropriate because: severity of illness  Jonnie Finner DO [facility]  If 7PM-7AM, please contact night-coverage [email or web address]  12/06/2021, 11:24 AM

## 2021-12-06 NOTE — Progress Notes (Addendum)
Pharmacy Antibiotic Note  [name] [name] is a 86 y.o. male admitted on 12/06/2021 with pneumonia.  Pharmacy has been consulted for Cefepime & Vancomycin dosing. Returned from SNF 12/12, febrile w/ ShOB.  Prev admit 11/24 > 12/11 for same  Plan: Cefepime 2gm q8 Vancomycin 1500mg  q24, AUC 465, SCr adj to 1 Daily SCr     Temp (24hrs), Avg:99.7 F (37.6 C), Min:98.4 F (36.9 C), Max:101 F (38.3 C)  Recent Labs  Lab 12/02/21 0512 12/04/21 0421 12/06/21 0900  WBC 4.3 4.0 8.7  CREATININE 0.47* 0.44* 0.82  LATICACIDVEN  --   --  1.7    Estimated Creatinine Clearance: 75.8 mL/min (by C-G formula based on SCr of 0.82 mg/dL).    Allergies  Allergen Reactions   Chocolate     Itching if he eats too much chocolate    Ciprofloxacin     Achilles tendon pain    Lipitor [Atorvastatin]     Muscle aches     Antimicrobials this admission: 12/12 Cefepime >>  12/12 Vancomycin >>   Dose adjustments this admission:  Microbiology results: 12/12 BCx x1: sent U/A: > 50 WBC, few bacteria, no UCx ordered  Thank you for allowing pharmacy to be a part of this patient's care.  14/12/22 PharmD 12/06/2021 1:38 PM

## 2021-12-06 NOTE — Progress Notes (Signed)
Pt nasotracheally suctioned.  Pt remained stable throughout procedure.  Copious amount of secretions suctioned which were of the appearance of tube feeding. NP and RN aware and at bedside.

## 2021-12-07 DIAGNOSIS — Z7189 Other specified counseling: Secondary | ICD-10-CM

## 2021-12-07 DIAGNOSIS — J189 Pneumonia, unspecified organism: Secondary | ICD-10-CM

## 2021-12-07 DIAGNOSIS — Z515 Encounter for palliative care: Secondary | ICD-10-CM

## 2021-12-07 LAB — PROTIME-INR
INR: 1.2 (ref 0.8–1.2)
Prothrombin Time: 15 seconds (ref 11.4–15.2)

## 2021-12-07 LAB — CORTISOL-AM, BLOOD: Cortisol - AM: 19.8 ug/dL (ref 6.7–22.6)

## 2021-12-07 LAB — CREATININE, SERUM
Creatinine, Ser: 0.75 mg/dL (ref 0.61–1.24)
GFR, Estimated: >60 mL/min (ref >60)

## 2021-12-07 LAB — PROCALCITONIN: Procalcitonin: 14.31 ng/mL

## 2021-12-07 MED ORDER — JEVITY 1.2 CAL PO LIQD: 20.0000 mL | ORAL | Status: DC

## 2021-12-07 MED ORDER — SODIUM CHLORIDE 0.9 % IV SOLN
3.0000 g | Freq: Four times a day (QID) | INTRAVENOUS | Status: DC
  Administered 2021-12-07 – 2021-12-10 (×13): 3 g via INTRAVENOUS
  Filled 2021-12-07 (×14): qty 8

## 2021-12-07 NOTE — Progress Notes (Signed)
    OVERNIGHT PROGRESS REPORT  Notified by RN for persistent wet sounding cough. NT suction ordered after bedside assessment. Patient has a wet cough that he appears to clear only to have recurrence in a short period of time. Tube feeding has been stopped and RT completed NT suction which revealed a possible TF colored return.  Tube feeding was stopped for 1 hour with improvement noted by RN. Therefore, Tube feeding is temporarily on hold. Access has been flushed.   Chinita Greenland MSNA MSN ACNPC-AG Acute Care Nurse Practitioner [facility]

## 2021-12-07 NOTE — Progress Notes (Signed)
PROGRESS NOTE  JARRIN STALEY ZOX:[id number] DOB: December 03, 1935 DOA: 12/06/2021 PCP: Karna Dupes, MD  HPI/Recap of past 24 hours:  [name] is a 86 y.o. male with medical history significant of dementia, seizures, BPH. Presenting with dyspnea. History is from wife at bedside and chart review. He was just discharged from the hospital for recurrent aspiration PNA. He was treated and returned to his facility. Reports are that he was increasingly confused overnight. He was having increased need for suctioning. He was found to by hypoxic. EMS was called to bring the patient back to the hospital.    ED Course: CXR showed PNA.  Procalcitonin greater than 14.  He was started on vanc, cefepime. [facility] was called for admission.  12/07/2021: Patient was seen and examined at his bedside.  He is minimally interactive.  Diffuse rhonchorous sounds noted with increasing sputum production requiring frequent suctioning by nursing staff at bedside.  Currently on IV antibiotics for aspiration pneumonia.  Assessment/Plan: Principal Problem:   Multifocal pneumonia  Multifocal pneumonia with concern for aspiration pneumonia, POA History of dysphagia with PEG tube in place. Presented with dyspnea, personally reviewed chest x-ray done on admission which shows right lower lobe infiltrates with concern for aspiration. Procalcitonin greater than 14. Started on IV antibiotics empirically for aspiration pneumonia Antibiotics switched to Unasyn on 12/07/2021. Obtain sputum culture. Continue pulmonary toilet with frequent suctioning Continue aspiration precautions Palliative care consulted due to poor prognosis  Hypovolemic hyponatremia Serum sodium 134 He received normal saline at 100 cc/h, rate reduced to 50 cc/h. Repeat BMP in the morning.  Moderate protein calorie malnutrition Serum albumin 2.8 Moderate muscle mass loss  Seizure disorder Continue home AEDs through PEG tube Seizure  precautions  BPH Continue home regimen via PEG tube Monitor urine output  Dementia Reorient as needed Resume home regimen   Code Status: Partial code, no to intubation or invasive mechanical ventilation.  Family Communication: We will call his wife to give her an update.  Disposition Plan: Suspect will require hospice care.   Consultants: Palliative care  Procedures: None  Antimicrobials: Unasyn 12/07/2021  DVT prophylaxis: Subcu Lovenox daily   Status is: Inpatient  Patient requires inpatient status.  Will require at least 2 midnights for further evaluation and treatment of present condition.      Objective: Vitals:   12/07/21 0628 12/07/21 0725 12/07/21 0800 12/07/21 0900  BP:  (!) 96/46 (!) 103/59   Pulse:   85   Resp:   (!) 25   Temp:    97.8 F (36.6 C)  TempSrc:    Axillary  SpO2:   93%   Weight: 76.1 kg     Height:        Intake/Output Summary (Last 24 hours) at 12/07/2021 1302 Last data filed at 12/07/2021 8119 Gross per 24 hour  Intake 1452.2 ml  Output 850 ml  Net 602.2 ml   Filed Weights   12/07/21 0628  Weight: 76.1 kg    Exam:  General: 86 y.o. year-old male well developed well nourished in no acute distress.  Alert and confused, minimally interactive. Cardiovascular: Regular rate and rhythm with no rubs or gallops.  No thyromegaly or JVD noted.   Respiratory: Clear to auscultation with no wheezes or rales. Good inspiratory effort. Abdomen: Soft nontender nondistended with normal bowel sounds x4 quadrants. Musculoskeletal: Trace lower extremity edema bilaterally Skin: No ulcerative lesions noted or rashes Psychiatry: Mood is appropriate for condition and setting   Data Reviewed: CBC:  Recent Labs  Lab 12/02/21 0512 12/04/21 0421 12/06/21 0900  WBC 4.3 4.0 8.7  NEUTROABS  --   --  7.3  HGB 11.3* 12.3* 12.8*  HCT 36.1* 39.3 41.0  MCV 87.6 84.5 85.4  PLT 312 432* 620*   Basic Metabolic Panel: Recent Labs  Lab  12/02/21 0512 12/04/21 0421 12/06/21 0900 12/06/21 1807 12/07/21 0406  NA 136 137 134*  --   --   K 4.3 4.3 4.7  --   --   CL 102 100 98  --   --   CO2 29 29 27   --   --   GLUCOSE 112* 114* 144*  --   --   BUN 15 17 27*  --   --   CREATININE 0.47* 0.44* 0.82  --  0.75  CALCIUM 8.5* 9.1 9.1  --   --   MG  --   --   --  2.0  --    GFR: Estimated Creatinine Clearance: 74 mL/min (by C-G formula based on SCr of 0.75 mg/dL). Liver Function Tests: Recent Labs  Lab 12/06/21 0900  AST 23  ALT 19  ALKPHOS 70  BILITOT 0.5  PROT 6.8  ALBUMIN 2.8*   No results for input(s): LIPASE, AMYLASE in the last 168 hours. Recent Labs  Lab 12/06/21 0931  AMMONIA 26   Coagulation Profile: Recent Labs  Lab 12/07/21 0406  INR 1.2   Cardiac Enzymes: No results for input(s): CKTOTAL, CKMB, CKMBINDEX, TROPONINI in the last 168 hours. BNP (last 3 results) No results for input(s): PROBNP in the last 8760 hours. HbA1C: No results for input(s): HGBA1C in the last 72 hours. CBG: Recent Labs  Lab 12/04/21 2003 12/04/21 2341 12/05/21 0430 12/05/21 0721 12/05/21 1148  GLUCAP 119* 115* 111* 100* 141*   Lipid Profile: No results for input(s): CHOL, HDL, LDLCALC, TRIG, CHOLHDL, LDLDIRECT in the last 72 hours. Thyroid Function Tests: No results for input(s): TSH, T4TOTAL, FREET4, T3FREE, THYROIDAB in the last 72 hours. Anemia Panel: No results for input(s): VITAMINB12, FOLATE, FERRITIN, TIBC, IRON, RETICCTPCT in the last 72 hours. Urine analysis:    Component Value Date/Time   COLORURINE YELLOW 12/06/2021 0929   APPEARANCEUR HAZY (A) 12/06/2021 0929   LABSPEC 1.025 12/06/2021 0929   PHURINE 6.0 12/06/2021 0929   GLUCOSEU NEGATIVE 12/06/2021 0929   GLUCOSEU NEGATIVE 02/06/2020 1032   HGBUR NEGATIVE 12/06/2021 0929   HGBUR negative 09/16/2010 0839   BILIRUBINUR NEGATIVE 12/06/2021 0929   BILIRUBINUR neg 02/22/2019 0958   KETONESUR 5 (A) 12/06/2021 0929   PROTEINUR NEGATIVE 12/06/2021  0929   UROBILINOGEN 0.2 02/06/2020 1032   NITRITE NEGATIVE 12/06/2021 0929   LEUKOCYTESUR SMALL (A) 12/06/2021 0929   Sepsis Labs: @LABRCNTIP (procalcitonin:4,lacticidven:4)  ) Recent Results (from the past 240 hour(s))  Resp Panel by RT-PCR (Flu A&B, Covid) Nasopharyngeal Swab     Status: None   Collection Time: 11/30/21  1:29 PM   Specimen: Nasopharyngeal Swab; Nasopharyngeal(NP) swabs in vial transport medium  Result Value Ref Range Status   SARS Coronavirus 2 by RT PCR NEGATIVE NEGATIVE Final    Comment: (NOTE) SARS-CoV-2 target nucleic acids are NOT DETECTED.  The SARS-CoV-2 RNA is generally detectable in upper respiratory specimens during the acute phase of infection. The lowest concentration of SARS-CoV-2 viral copies this assay can detect is 138 copies/mL. A negative result does not preclude SARS-Cov-2 infection and should not be used as the sole basis for treatment or other patient management decisions. A negative result may  occur with  improper specimen collection/handling, submission of specimen other than nasopharyngeal swab, presence of viral mutation(s) within the areas targeted by this assay, and inadequate number of viral copies(<138 copies/mL). A negative result must be combined with clinical observations, patient history, and epidemiological information. The expected result is Negative.  Fact Sheet for Patients:  BloggerCourse.com  Fact Sheet for Healthcare Providers:  SeriousBroker.it  This test is no t yet approved or cleared by the [place] FDA and  has been authorized for detection and/or diagnosis of SARS-CoV-2 by FDA under an Emergency Use Authorization (EUA). This EUA will remain  in effect (meaning this test can be used) for the duration of the COVID-19 declaration under Section 564(b)(1) of the Act, 21 U.S.C.section 360bbb-3(b)(1), unless the authorization is terminated  or revoked sooner.        Influenza A by PCR NEGATIVE NEGATIVE Final   Influenza B by PCR NEGATIVE NEGATIVE Final    Comment: (NOTE) The Xpert Xpress SARS-CoV-2/FLU/RSV plus assay is intended as an aid in the diagnosis of influenza from Nasopharyngeal swab specimens and should not be used as a sole basis for treatment. Nasal washings and aspirates are unacceptable for Xpert Xpress SARS-CoV-2/FLU/RSV testing.  Fact Sheet for Patients: BloggerCourse.com  Fact Sheet for Healthcare Providers: SeriousBroker.it  This test is not yet approved or cleared by the [place] FDA and has been authorized for detection and/or diagnosis of SARS-CoV-2 by FDA under an Emergency Use Authorization (EUA). This EUA will remain in effect (meaning this test can be used) for the duration of the COVID-19 declaration under Section 564(b)(1) of the Act, 21 U.S.C. section 360bbb-3(b)(1), unless the authorization is terminated or revoked.  Performed at [facility], [address] [place], [place] [address]   Culture, blood (Routine X 2) w Reflex to ID Panel     Status: None (Preliminary result)   Collection Time: 12/06/21  9:29 AM   Specimen: BLOOD  Result Value Ref Range Status   Specimen Description   Final    BLOOD RIGHT ANTECUBITAL Performed at [facility], [address] [place], [place] [address]    Special Requests   Final    BOTTLES DRAWN AEROBIC AND ANAEROBIC Blood Culture adequate volume Performed at [facility], [address], [place], [place] [address]    Culture   Final    NO GROWTH < 24 HOURS Performed at [facility], [address] [place], [place] [address]    Report Status PENDING  Incomplete  Resp Panel by RT-PCR (Flu A&B, Covid) Nasopharyngeal Swab     Status: None   Collection Time: 12/06/21  9:29 AM   Specimen: Nasopharyngeal Swab; Nasopharyngeal(NP) swabs in vial transport  medium  Result Value Ref Range Status   SARS Coronavirus 2 by RT PCR NEGATIVE NEGATIVE Final    Comment: (NOTE) SARS-CoV-2 target nucleic acids are NOT DETECTED.  The SARS-CoV-2 RNA is generally detectable in upper respiratory specimens during the acute phase of infection. The lowest concentration of SARS-CoV-2 viral copies this assay can detect is 138 copies/mL. A negative result does not preclude SARS-Cov-2 infection and should not be used as the sole basis for treatment or other patient management decisions. A negative result may occur with  improper specimen collection/handling, submission of specimen other than nasopharyngeal swab, presence of viral mutation(s) within the areas targeted by this assay, and inadequate number of viral copies(<138 copies/mL). A negative result must be combined with clinical observations, patient  history, and epidemiological information. The expected result is Negative.  Fact Sheet for Patients:  BloggerCourse.com  Fact Sheet for Healthcare Providers:  SeriousBroker.it  This test is no t yet approved or cleared by the [place] FDA and  has been authorized for detection and/or diagnosis of SARS-CoV-2 by FDA under an Emergency Use Authorization (EUA). This EUA will remain  in effect (meaning this test can be used) for the duration of the COVID-19 declaration under Section 564(b)(1) of the Act, 21 U.S.C.section 360bbb-3(b)(1), unless the authorization is terminated  or revoked sooner.       Influenza A by PCR NEGATIVE NEGATIVE Final   Influenza B by PCR NEGATIVE NEGATIVE Final    Comment: (NOTE) The Xpert Xpress SARS-CoV-2/FLU/RSV plus assay is intended as an aid in the diagnosis of influenza from Nasopharyngeal swab specimens and should not be used as a sole basis for treatment. Nasal washings and aspirates are unacceptable for Xpert Xpress SARS-CoV-2/FLU/RSV testing.  Fact Sheet for  Patients: BloggerCourse.com  Fact Sheet for Healthcare Providers: SeriousBroker.it  This test is not yet approved or cleared by the [place] FDA and has been authorized for detection and/or diagnosis of SARS-CoV-2 by FDA under an Emergency Use Authorization (EUA). This EUA will remain in effect (meaning this test can be used) for the duration of the COVID-19 declaration under Section 564(b)(1) of the Act, 21 U.S.C. section 360bbb-3(b)(1), unless the authorization is terminated or revoked.  Performed at [facility], [address] [place], [place] [address]   Culture, blood (Routine X 2) w Reflex to ID Panel     Status: None (Preliminary result)   Collection Time: 12/06/21  4:32 PM   Specimen: BLOOD  Result Value Ref Range Status   Specimen Description   Final    BLOOD LEFT ANTECUBITAL Performed at [facility], [address] [place], [place] [address]    Special Requests   Final    BOTTLES DRAWN AEROBIC ONLY Blood Culture adequate volume Performed at [facility], [address], [place], [place] [address]    Culture   Final    NO GROWTH < 12 HOURS Performed at [facility], [address], [place], [place] [address]    Report Status PENDING  Incomplete      Studies: CT HEAD WO CONTRAST ( )  Result Date: 12/06/2021 CLINICAL DATA:  Encephalopathy, altered mental status EXAM: CT HEAD WITHOUT CONTRAST TECHNIQUE: Contiguous axial images were obtained from the base of the skull through the vertex without intravenous contrast. COMPARISON:  11/18/2021 FINDINGS: Brain: No acute infarct, hemorrhage, mass, mass effect, or midline shift. No extra-axial collection or hydrocephalus. Unchanged generalized atrophy. Periventricular white matter changes, likely the sequela of chronic small vessel ischemic disease. Lacunar infarcts in the basal ganglia. Vascular: No  hyperdense vessel. Skull: Normal. Negative for fracture or focal lesion. Sinuses/Orbits: Postsurgical changes in the paranasal sinuses. Redemonstrated left maxillary mucous retention cyst. Mucosal thickening in the hypoplastic right maxillary sinus and ethmoid air cells. Other: None. IMPRESSION: No acute intracranial process. Electronically Signed   By: Wiliam Ke M.D.   On: 12/06/2021 19:13    Scheduled Meds:  vitamin C  250 mg Per Tube Daily   brinzolamide  1 drop Both Eyes TID   enoxaparin (LOVENOX) injection  40 mg Subcutaneous Q24H   feeding supplement (PROSource TF)  45 mL Per Tube Daily   free water  150 mL Per Tube Q6H   latanoprost  2 drop  Both Eyes QHS   levETIRAcetam  500 mg Per Tube BID   melatonin  10 mg Per Tube QHS   memantine  10 mg Per Tube QHS   multivitamin  5 mL Per Tube Daily   Netarsudil Dimesylate  1 drop Both Eyes QHS   valproic acid  1,000 mg Per Tube Daily   valproic acid  500 mg Per Tube QHS   zinc oxide  1 application Topical BID    Continuous Infusions:  sodium chloride 20 mL/hr at 12/06/21 0942   sodium chloride 100 mL/hr at 12/07/21 0120   ampicillin-sulbactam (UNASYN) IV 3 g (12/07/21 1238)     LOS: 1 day     Darlin Drop, MD [facility] Pager [phone]  If 7PM-7AM, please contact night-coverage [email or web address] Password [email or web address] 12/07/2021, 1:02 PM

## 2021-12-07 NOTE — Plan of Care (Deleted)
  Problem: Clinical Measurements: Goal: Cardiovascular complication will be avoided Outcome: Progressing   Problem: Education: Goal: Knowledge of General Education information will improve Description: Including pain rating scale, medication(s)/side effects and non-pharmacologic comfort measures Outcome: Not Progressing   Problem: Health Behavior/Discharge Planning: Goal: Ability to manage health-related needs will improve Outcome: Not Progressing   Problem: Clinical Measurements: Goal: Respiratory complications will improve Outcome: Not Progressing

## 2021-12-07 NOTE — Progress Notes (Signed)
Initial Nutrition Assessment  DOCUMENTATION CODES:   Non-severe (moderate) malnutrition in context of chronic illness  INTERVENTION:   -Continue Jevity 1.2 @ 70 ml/hr via PEG -45 ml Prosource TF daily -150 ml free water every 6 hours (600 ml) -Providing 2056 kcals, 104g protein and 1955 ml H2O  NUTRITION DIAGNOSIS:   Moderate Malnutrition related to dysphagia, chronic illness as evidenced by mild fat depletion, moderate muscle depletion.  GOAL:   Patient will meet greater than or equal to 90% of their needs  MONITOR:   Labs, Weight trends, TF tolerance, Skin, I & O's  REASON FOR ASSESSMENT:   Consult Enteral/tube feeding initiation and management  ASSESSMENT:   86 y.o. male with medical history significant of dementia, seizures, BPH. Presenting with dyspnea. History is from wife at bedside and chart review. He was just discharged from the hospital for recurrent aspiration PNA. He was treated and returned to his facility. Reports are that he was increasingly confused overnight. He was having increased need for suctioning.  Patient in room with wife at bedside. Pt sleeping and per wife, he has not been awake very much today. Disoriented x 4. Per RN in hall, pt supposed to have visit from Palliative care today. Pt just discharged this past weekend.  Pt discharged on continuous feeds of Jevity 1.2 @ 70 ml/hr. Pt back with suspected aspiration PNA.  Per documentation ,pt with tan colored secretions.  Tube feeds  were turned off last night but now have resumed at goal rate. Will continue to monitor if rate needs to be decreased. Do not recommend bolus feeds if aspiration risk is this high.  Per weight records, pt has lost 5 lbs since 11/11 (2% wt loss x 1 month, insignificant for time frame).  Medications: Vitamin C, liquid MVI  Labs reviewed.  NUTRITION - FOCUSED PHYSICAL EXAM:  Flowsheet Row Most Recent Value  Orbital Region Mild depletion  Upper Arm Region Mild  depletion  Thoracic and Lumbar Region Unable to assess  Buccal Region Mild depletion  Temple Region Mild depletion  Clavicle Bone Region Mild depletion  Clavicle and Acromion Bone Region Mild depletion  Scapular Bone Region Mild depletion  Dorsal Hand Unable to assess  [disoriented]  Patellar Region Moderate depletion  Anterior Thigh Region Moderate depletion  Posterior Calf Region Unable to assess  Edema (RD Assessment) None  Hair Reviewed  Eyes Unable to assess  [disoriented]  Mouth Reviewed  Skin Reviewed       Diet Order:   Diet Order             Diet NPO time specified  Diet effective now                   EDUCATION NEEDS:   No education needs have been identified at this time  Skin:  DTI: bilateral heels Other: skin tears to bilateral pre-tibial area, sacrum, L thigh  Last BM:  12/12 -type 6  Height:   Ht Readings from Last 1 Encounters:  12/06/21 6\' 1"  (1.854 m)    Weight:   Wt Readings from Last 1 Encounters:  12/07/21 76.1 kg    BMI:  Body mass index is 22.13 kg/m.  Estimated Nutritional Needs:   Kcal:  2000-2200  Protein:  100-115g  Fluid:  2L/day  12/09/21, MS, RD, LDN Inpatient Clinical Dietitian Contact information available via Amion

## 2021-12-07 NOTE — NC FL2 (Signed)
[place] MEDICAID FL2 LEVEL OF CARE SCREENING TOOL     IDENTIFICATION  Patient Name: [name] [name] Birthdate: September 12, 1935 Sex: male Admission Date (Current Location): 12/06/2021  [facility] and [place] Number:  Lobbyist and Address:  [facility],  [address] [place], [place] [address]      Provider Number: [id number]  Attending Physician Name and Address:  Darlin Drop, DO  Relative Name and Phone Number:  Sirek,[name] Spouse [phone]  [phone]  Dale, Ribeiro   [phone]  Enis,Ronnie Brother [phone]  [phone]  Shaw,Bonnita Daughter   [phone]    Current Level of Care: Hospital Recommended Level of Care: Skilled Nursing Facility Prior Approval Number:    Date Approved/Denied:   PASRR Number: [id number]  Discharge Plan: SNF    Current Diagnoses: Patient Active Problem List   Diagnosis Date Noted   Multifocal pneumonia 12/06/2021   Acute metabolic encephalopathy 11/19/2021   Acute encephalopathy 11/18/2021   Recurrent pneumonia 10/20/2021   Pressure injury of skin 05/11/2021   Pericardial effusion, acute    COVID    Palliative care by specialist    Goals of care, counseling/discussion    General weakness    Syncope 05/07/2021   Seizure (HCC) 03/09/2021   Dementia (HCC) 02/25/2021   BPH with urinary obstruction 12/22/2020   Ankle edema, bilateral 07/13/2020   Hallucinations 02/09/2020   Abdominal pain, acute 08/21/2014   GI bleeding 05/26/2012   Renal mass 05/26/2012   Diverticulosis of colon with hemorrhage 05/26/2012   SHOULDER PAIN, BILATERAL 07/30/2010   HERPES SIMPLEX INFECTION 10/10/2007   TACHYCARDIA, PAROXYSMAL NOS 10/10/2007   Dyslipidemia 07/11/2007   Essential hypertension 07/11/2007   ALLERGIC RHINITIS 07/11/2007    Orientation RESPIRATION BLADDER Height & Weight     Self  O2 (2L O2 [place]) Incontinent Weight: 76.1 kg Height:  6\' 1"  (185.4 cm)  BEHAVIORAL SYMPTOMS/MOOD  NEUROLOGICAL BOWEL NUTRITION STATUS      Incontinent Diet, Feeding tube (Patient is a new peg tube.)  AMBULATORY STATUS COMMUNICATION OF NEEDS Skin   Extensive Assist Verbally PU Stage and Appropriate Care PU Stage 1 Dressing:  (once daily) PU Stage 2 Dressing: Daily (Sacrum prn)                   Personal Care Assistance Level of Assistance  Bathing, Feeding, Dressing Bathing Assistance: Maximum assistance Feeding assistance: Maximum assistance Dressing Assistance: Maximum assistance     Functional Limitations Info  Sight, Hearing, Speech Sight Info: Adequate Hearing Info: Adequate Speech Info: Adequate    SPECIAL CARE FACTORS FREQUENCY  PT (By licensed PT), OT (By licensed OT), Speech therapy     PT Frequency: Eval and Treat OT Frequency: Eval and Treat     Speech Therapy Frequency: Minimum  2x a week      Contractures Contractures Info: Not present    Additional Factors Info  Code Status, Psychotropic, Allergies, Isolation Precautions, Suctioning Needs (Oral) Code Status Info: FULL Allergies Info: Chocolate, Ciprofloxacin, Lipitor (Atorvastatin) Psychotropic Info: Risperdal   Isolation Precautions Info: COVID Suctioning Needs: Oral   Current Medications (12/07/2021):  This is the current hospital active medication list Current Facility-Administered Medications  Medication Dose Route Frequency Provider Last Rate Last Admin   0.9 %  sodium chloride infusion   Intravenous Continuous Kyle, Tyrone A, DO 20 mL/hr at 12/06/21 0942 New Bag at 12/06/21 0942   0.9 %  sodium chloride infusion   Intravenous Continuous Kyle, Tyrone A, DO 100 mL/hr at 12/07/21 0120 New  Bag at 12/07/21 0120   acetaminophen (TYLENOL) tablet 650 mg  650 mg Per Tube Q6H PRN Marylyn Ishihara, Tyrone A, DO   650 mg at 12/07/21 1011   Or   acetaminophen (TYLENOL) suppository 650 mg  650 mg Rectal Q6H PRN Marylyn Ishihara, Tyrone A, DO       Ampicillin-Sulbactam (UNASYN) 3 g in sodium chloride 0.9 % 100 mL IVPB  3 g  Intravenous Q6H Hall, Carole N, DO       ascorbic acid (VITAMIN C) tablet 250 mg  250 mg Per Tube Daily Kyle, Tyrone A, DO   250 mg at 12/07/21 0827   brinzolamide (AZOPT) 1 % ophthalmic suspension 1 drop  1 drop Both Eyes TID Marylyn Ishihara, Tyrone A, DO   1 drop at 12/07/21 1012   enoxaparin (LOVENOX) injection 40 mg  40 mg Subcutaneous Q24H Kyle, Tyrone A, DO   40 mg at 12/06/21 1722   feeding supplement (PROSource TF) liquid 45 mL  45 mL Per Tube Daily Kyle, Tyrone A, DO   45 mL at 12/07/21 1011   free water 150 mL  150 mL Per Tube Q6H Kyle, Tyrone A, DO   150 mL at 12/07/21 0531   guaiFENesin (ROBITUSSIN) 100 MG/5ML liquid 15 mL  15 mL Per Tube Q4H PRN Marylyn Ishihara, Tyrone A, DO   15 mL at 12/07/21 0827   latanoprost (XALATAN) 0.005 % ophthalmic solution 2 drop  2 drop Both Eyes QHS Kyle, Tyrone A, DO   2 drop at 12/06/21 2240   levETIRAcetam (KEPPRA) 100 MG/ML solution 500 mg  500 mg Per Tube BID Marylyn Ishihara, Tyrone A, DO   500 mg at 12/07/21 1011   melatonin tablet 10 mg  10 mg Per Tube QHS Marylyn Ishihara, Tyrone A, DO       memantine (NAMENDA) tablet 10 mg  10 mg Per Tube QHS Marylyn Ishihara, Tyrone A, DO   10 mg at 12/06/21 2242   multivitamin liquid 5 mL  5 mL Per Tube Daily Kyle, Tyrone A, DO   5 mL at 12/07/21 1012   Netarsudil Dimesylate 0.02 % SOLN 1 drop  1 drop Both Eyes QHS Kyle, Tyrone A, DO       valproic acid (DEPAKENE) 250 MG/5ML solution 1,000 mg  1,000 mg Per Tube Daily Kyle, Tyrone A, DO   1,000 mg at 12/07/21 1011   valproic acid (DEPAKENE) 250 MG/5ML solution 500 mg  500 mg Per Tube QHS Kyle, Tyrone A, DO   500 mg at 12/06/21 2241   zinc oxide 20 % ointment 1 application  1 application Topical BID Cherylann Ratel A, DO   1 application at [phone]     Discharge Medications: Please see discharge summary for a list of discharge medications.  Relevant Imaging Results:  Relevant Lab Results:   Additional Information SSN [id number]  Purcell Mouton, RN

## 2021-12-07 NOTE — TOC Progression Note (Signed)
Transition of Care [facility]) - Progression Note    Patient Details  Name: [name] [name] MRN: [id number] Date of Birth: 15-Jan-1935  Transition of Care [facility] CM/SW Contact  Geni Bers, RN Phone Number: 12/07/2021, 9:49 AM  Clinical Narrative:    Pt readmitted form [facility] with cco increase confusion, increase need for suctioning, and found to have hypoxic. TOC will continue to follow. Pt may benefit with Palliative Care.    Expected Discharge Plan: Skilled Nursing Facility Barriers to Discharge: No Barriers Identified  Expected Discharge Plan and Services Expected Discharge Plan: Skilled Nursing Facility   Discharge Planning Services: CM Consult Post Acute Care Choice: Nursing Home Living arrangements for the past 2 months: Skilled Nursing Facility                                       Social Determinants of Health (SDOH) Interventions    Readmission Risk Interventions Readmission Risk Prevention Plan 10/22/2021  Transportation Screening Complete  Medication Review Ocean[facility]) Referral to Pharmacy  PCP or Specialist appointment within 3-5 days of discharge Complete  HRI or Home Care Consult Not Complete  HRI or Home Care Consult Pt Refusal Comments from SNF  SW Recovery Care/Counseling Consult Complete  Palliative Care Screening Not Applicable  Skilled Nursing Facility Complete  Some recent data might be hidden

## 2021-12-07 NOTE — Plan of Care (Signed)
  Problem: Clinical Measurements: Goal: Cardiovascular complication will be avoided Outcome: Progressing   Problem: Respiratory: Goal: Ability to maintain adequate ventilation will improve Outcome: Progressing   Problem: Education: Goal: Knowledge of General Education information will improve Description: Including pain rating scale, medication(s)/side effects and non-pharmacologic comfort measures Outcome: Not Progressing   Problem: Health Behavior/Discharge Planning: Goal: Ability to manage health-related needs will improve Outcome: Not Progressing   Problem: Clinical Measurements: Goal: Respiratory complications will improve Outcome: Not Progressing   Problem: Activity: Goal: Ability to tolerate increased activity will improve Outcome: Not Progressing   Problem: Respiratory: Goal: Ability to maintain a clear airway will improve Outcome: Not Progressing

## 2021-12-07 NOTE — Progress Notes (Signed)
Pt had wet cough and was unable to clear secretions from throat.   Oncall provider notified and came to bedside.  RT called to NT suction.  Large amount of thin liquid secretions, similar to tube feed suctioned.  Provider advised to hold tube feed and evaluate for improvement.   Overall, pt improved.  Coughing decreased.  Pt rested comfortably.   NT suction required at 0230 with a small amount of secretions, similar to above.  Coughing resolved.   Will continue to monitor.

## 2021-12-07 NOTE — Consult Note (Signed)
Consultation Note Date: 12/07/2021   Patient Name: [name] [name]  DOB: 05-Oct-1935  MRN: [id number]  Age / Sex: 86 y.o., male  PCP: Raymondo Band, MD Referring Physician: Kayleen Memos, DO  Reason for Consultation: Establishing goals of care  HPI/Patient Profile: 86 y.o. male  with past medical history of dementia, seizures, BPH admitted on 12/06/2021 with recurrent aspiration pneumonia and sepsis.   Clinical Assessment and Goals of Care: I met today at Previn's bedside but no family/visitors present. He is very lethargic but does crack his eyes open when I call his name but unable to fully open and no tracking. He does not follow commands. He does grimace and moan when assessing his feet.   I called and spoke with his wife Mikle Bosworth regarding his condition and poor prognosis related to end stage dementia. She reports that he was much more alert on Saturday. She verifies that he would not want intubation and I explain the limited benefit of CPR/shock in his declining condition. I also expressed concern of ongoing aspiration and risks of continuing tube feeding vs focus on comfort and even consideration of hospice. Mikle Bosworth expresses understanding and asks that I explain to her son as he had many questions.   I called and spoke with son Kevan Ny who was able to get his sister Sidney Ace on phone as well. I explained the trajectory of dementia and how this impacts all functions and loss of ability to swallow is indicative of end stage disease. They were confused about aspiration with PEG feeding and I explained how he can continue to aspirate on tube feeding as well as his own saliva and that we unfortunately do not have a fix to this issue. We discussed that PEG placement is not usually recommended in dementia due to this reason but we also know that he will not be able to eat and drink without PEG feeding. I explained  that patients with advanced dementia do not feel hunger and thirst the way that we do. I explained that his prognosis remains poor and his time is limited despite any decisions we make and we should consider what we are asking him to go through. I explained the limited benefits of CPR/shock is his current condition. I also recommended consideration of stopping tube feeding and allowing him comfort recognizing he is approaching end of life. I explained that we do not take these recommendations lightly and I wish that I had more options to provide them. They plan to speak further with their mother and let me know their decisions.   All questions/concerns addressed. Emotional support provided. Updated Dr. Nevada Crane.   Primary Decision Maker NEXT OF KIN wife with assistance of adult son and daughter    [place]   - Family considering DNR status and potential for comfort vs continuing current care.  Code Status/Advance Care Planning: Limited code - no intubation (confirmed with wife and children)   Prognosis:  Overall prognosis poor with end stage dementia and poor toleration of tube  feeding with recurrent aspiration.   Discharge Planning: To Be Determined      Primary Diagnoses: Present on Admission:  Multifocal pneumonia   I have reviewed the medical record, interviewed the patient and family, and examined the patient. The following aspects are pertinent.  Past Medical History:  Diagnosis Date   Allergy    Dementia [facility])    sees Dr. Ellouise Newer    Diverticulosis of colon (without mention of hemorrhage) 03-01-2004, 04-04-2011   Colonoscopy   ED (erectile dysfunction)    Glaucoma    sees Dr. Crecencio Mc    Hyperglycemia    Hyperlipidemia    Hypertension    Internal hemorrhoids 03-15-1999   Flex Sig    Irritable bowel    Social History   Socioeconomic History   Marital status: Married    Spouse name: Not on file   Number of children: 2   Years of education: 11    Highest education level: Not on file  Occupational History   Occupation: retired    Fish farm manager: RETIRED  Tobacco Use   Smoking status: Former    Types: Cigarettes    Quit date: 03/20/1969    Years since quitting: 52.7   Smokeless tobacco: Never  Vaping Use   Vaping Use: Never used  Substance and Sexual Activity   Alcohol use: Yes    Alcohol/week: 0.0 standard drinks    Comment: occ   Drug use: No   Sexual activity: Not on file  Other Topics Concern   Not on file  Social History Narrative   Right handed   Drinks caffeine   One story home   Social Determinants of Health   Financial Resource Strain: Not on file  Food Insecurity: Not on file  Transportation Needs: Not on file  Physical Activity: Not on file  Stress: Not on file  Social Connections: Not on file   Family History  Problem Relation Age of Onset   Hypertension Mother    Lung cancer Father    Colitis Daughter    Scheduled Meds:  vitamin C  250 mg Per Tube Daily   brinzolamide  1 drop Both Eyes TID   enoxaparin (LOVENOX) injection  40 mg Subcutaneous Q24H   feeding supplement (PROSource TF)  45 mL Per Tube Daily   free water  150 mL Per Tube Q6H   latanoprost  2 drop Both Eyes QHS   levETIRAcetam  500 mg Per Tube BID   melatonin  10 mg Per Tube QHS   memantine  10 mg Per Tube QHS   multivitamin  5 mL Per Tube Daily   Netarsudil Dimesylate  1 drop Both Eyes QHS   valproic acid  1,000 mg Per Tube Daily   valproic acid  500 mg Per Tube QHS   zinc oxide  1 application Topical BID   Continuous Infusions:  sodium chloride 20 mL/hr at 12/06/21 0942   sodium chloride 50 mL/hr at 12/07/21 1411   ampicillin-sulbactam (UNASYN) IV 3 g (12/07/21 1238)   PRN Meds:.acetaminophen **OR** acetaminophen, guaiFENesin Allergies  Allergen Reactions   Chocolate     Itching if he eats too much chocolate    Ciprofloxacin     Achilles tendon pain    Lipitor [Atorvastatin]     Muscle aches    Review of Systems   Unable to perform ROS: Dementia   Physical Exam Vitals and nursing note reviewed.  Constitutional:      Appearance: He is ill-appearing.  Comments: Thin, frail BUE edema  Cardiovascular:     Rate and Rhythm: Normal rate.  Pulmonary:     Effort: Tachypnea present. No accessory muscle usage or respiratory distress.  Abdominal:     General: Abdomen is flat.     Palpations: Abdomen is soft.  Neurological:     Mental Status: He is lethargic.     Comments: Only slightly opens eyes (NOT to command); grimace during assessment    Vital Signs: BP 114/65 (BP Location: Left Arm)    Pulse 73    Temp 97.9 F (36.6 C) (Axillary)    Resp (!) 25    Ht 6' 1"  (1.854 m)    Wt 76.1 kg    SpO2 95%    BMI 22.13 kg/m  Pain Scale: Faces   Pain Score: Asleep   SpO2: SpO2: 95 % O2 Device:SpO2: 95 % O2 Flow Rate: .O2 Flow Rate (L/min): 1 L/min  IO: Intake/output summary:  Intake/Output Summary (Last 24 hours) at 12/07/2021 1440 Last data filed at 12/07/2021 5369 Gross per 24 hour  Intake 1452.2 ml  Output 850 ml  Net 602.2 ml    LBM: Last BM Date: 12/06/21 Baseline Weight: Weight: 76.1 kg Most recent weight: Weight: 76.1 kg     Palliative Assessment/Data:     Time In: 1445 Time Out: 1600 Time Total: 75 min Greater than 50%  of this time was spent counseling and coordinating care related to the above assessment and plan.  Signed by: Vinie Sill, NP Palliative Medicine Team Pager # [phone] (M-F 8a-5p) Team Phone # [phone] (Nights/Weekends)

## 2021-12-08 DIAGNOSIS — F03C0 Unspecified dementia, severe, without behavioral disturbance, psychotic disturbance, mood disturbance, and anxiety: Secondary | ICD-10-CM

## 2021-12-08 LAB — COMPREHENSIVE METABOLIC PANEL
ALT: 16 U/L (ref 0–44)
AST: 25 U/L (ref 15–41)
Albumin: 2 g/dL — ABNORMAL LOW (ref 3.5–5.0)
Alkaline Phosphatase: 56 U/L (ref 38–126)
Anion gap: 7 (ref 5–15)
BUN: 20 mg/dL (ref 8–23)
CO2: 26 mmol/L (ref 22–32)
Calcium: 8.3 mg/dL — ABNORMAL LOW (ref 8.9–10.3)
Chloride: 104 mmol/L (ref 98–111)
Creatinine, Ser: 0.55 mg/dL — ABNORMAL LOW (ref 0.61–1.24)
GFR, Estimated: >60 mL/min (ref >60)
Glucose, Bld: 121 mg/dL — ABNORMAL HIGH (ref 70–99)
Potassium: 4.4 mmol/L (ref 3.5–5.1)
Sodium: 137 mmol/L (ref 135–145)
Total Bilirubin: <0.1 mg/dL — ABNORMAL LOW (ref 0.3–1.2)
Total Protein: 5.2 g/dL — ABNORMAL LOW (ref 6.5–8.1)

## 2021-12-08 LAB — PROCALCITONIN: Procalcitonin: 6.52 ng/mL

## 2021-12-08 LAB — CBC WITH DIFFERENTIAL/PLATELET
Abs Immature Granulocytes: 0.03 10*3/uL (ref 0.00–0.07)
Basophils Absolute: 0 10*3/uL (ref 0.0–0.1)
Basophils Relative: 1 %
Eosinophils Absolute: 0.2 10*3/uL (ref 0.0–0.5)
Eosinophils Relative: 3 %
HCT: 32.3 % — ABNORMAL LOW (ref 39.0–52.0)
Hemoglobin: 9.9 g/dL — ABNORMAL LOW (ref 13.0–17.0)
Immature Granulocytes: 0 %
Lymphocytes Relative: 9 %
Lymphs Abs: 0.6 10*3/uL — ABNORMAL LOW (ref 0.7–4.0)
MCH: 27.2 pg (ref 26.0–34.0)
MCHC: 30.7 g/dL (ref 30.0–36.0)
MCV: 88.7 fL (ref 80.0–100.0)
Monocytes Absolute: 1.5 10*3/uL — ABNORMAL HIGH (ref 0.1–1.0)
Monocytes Relative: 22 %
Neutro Abs: 4.4 10*3/uL (ref 1.7–7.7)
Neutrophils Relative %: 65 %
Platelets: 363 10*3/uL (ref 150–400)
RBC: 3.64 MIL/uL — ABNORMAL LOW (ref 4.22–5.81)
RDW: 14.7 % (ref 11.5–15.5)
WBC: 6.7 10*3/uL (ref 4.0–10.5)
nRBC: 0 % (ref 0.0–0.2)

## 2021-12-08 LAB — MAGNESIUM: Magnesium: 2.1 mg/dL (ref 1.7–2.4)

## 2021-12-08 LAB — PHOSPHORUS: Phosphorus: 2.5 mg/dL (ref 2.5–4.6)

## 2021-12-08 MED ORDER — JEVITY 1.2 CAL PO LIQD
1000.0000 mL | ORAL | Status: DC
  Administered 2021-12-08: 03:00:00 1000 mL
  Filled 2021-12-08 (×6): qty 1000

## 2021-12-08 MED ORDER — IPRATROPIUM-ALBUTEROL 0.5-2.5 (3) MG/3ML IN SOLN
3.0000 mL | Freq: Four times a day (QID) | RESPIRATORY_TRACT | Status: DC
  Administered 2021-12-08: 09:00:00 3 mL via RESPIRATORY_TRACT
  Filled 2021-12-08: qty 3

## 2021-12-08 MED ORDER — IPRATROPIUM-ALBUTEROL 0.5-2.5 (3) MG/3ML IN SOLN: 3.0000 mL | Freq: Four times a day (QID) | RESPIRATORY_TRACT | Status: DC | PRN

## 2021-12-08 NOTE — Progress Notes (Signed)
PROGRESS NOTE  [name] [name]:[id number] DOB: 1935/01/25 DOA: 12/06/2021 PCP: Karna Dupes, MD  HPI/Recap of past 24 hours: [name] [name] is a 86 y.o. male with medical history significant of dementia, seizures, BPH. Presenting with dyspnea. History is from wife at bedside and chart review. He was just discharged from the hospital for recurrent aspiration PNA. He was treated and returned to his facility. Reports are that he was increasingly confused overnight. He was having increased need for suctioning. He was found to by hypoxic. EMS was called to bring the patient back to the hospital.    CXR showed PNA.  Procalcitonin greater than 14.  He was started on IV vanc, cefepime. TRH was called for admission.  12/08/2021: Patient was seen and examined at his bedside.  He is minimally interactive.  Seen by palliative care team, ongoing goals of care discussion.  Assessment/Plan: Principal Problem:   Multifocal pneumonia  Multifocal pneumonia with concern for aspiration pneumonia, POA History of dysphagia with PEG tube in place. Presented with dyspnea, personally reviewed chest x-ray done on admission which shows right lower lobe infiltrates with concern for aspiration. Procalcitonin greater than 14. Continue empiric IV antibiotics for aspiration pneumonia Antibiotics switched to Unasyn on 12/07/2021. Obtain sputum culture and follow results. Continue pulmonary toilet with frequent suctioning Continue aspiration precautions Palliative care consulted due to poor prognosis  Resolved post repletion: Hypovolemic hyponatremia Serum sodium 134>> 137, received IV fluid hydration Currently on PEG tube feeding at 45 cc/h.  Moderate protein calorie malnutrition Serum albumin 2.8> 2.0. Moderate muscle mass loss  Seizure disorder Continue home AEDs through PEG tube Seizure precautions  BPH Continue home regimen via PEG tube Continue to monitor urine output Nonoliguric, urine  output 1.2 L in the last 24 hours.  Dementia Continue to reorient as needed Resume home regimen   Code Status: Partial code, no to intubation or invasive mechanical ventilation.  Family Communication: Updated wife via phone on 05/07/2021.Marland Kitchen  Disposition Plan: Suspect will require hospice care.   Consultants: Palliative care  Procedures: None  Antimicrobials: Unasyn 12/07/2021  DVT prophylaxis: Subcu Lovenox daily   Status is: Inpatient  Patient requires inpatient status.  Will require at least 2 midnights for further evaluation and treatment of present condition.      Objective: Vitals:   12/08/21 0500 12/08/21 0850 12/08/21 1409 12/08/21 1410  BP:   116/70   Pulse: 70  73 76  Resp: (!) 24  19   Temp:    98.9 F (37.2 C)  TempSrc:    Oral  SpO2: 99% 95% 97% 97%  Weight:      Height:        Intake/Output Summary (Last 24 hours) at 12/08/2021 1432 Last data filed at 12/08/2021 0506 Gross per 24 hour  Intake 2020.08 ml  Output 1200 ml  Net 820.08 ml   Filed Weights   12/07/21 0628  Weight: 76.1 kg    Exam:  General: 86 y.o. year-old male well-developed well-nourished in no acute distress.  He is somnolent and minimally interactive. Cardiovascular: Regular rate and rhythm with no rubs or gallops. Respiratory: Diffuse rales bilaterally poinsettia effort. Abdomen: Soft nontender normal bowel sounds present.  PEG tube in place.   Musculoskeletal: Trace lower extremity edema bilaterally. Skin: Pressure wounds, POA. Psychiatry: Unable to assess mood due to somnolence.   Data Reviewed: CBC: Recent Labs  Lab 12/02/21 0512 12/04/21 0421 12/06/21 0900 12/08/21 0606  WBC 4.3 4.0 8.7 6.7  NEUTROABS  --   --  7.3 4.4  HGB 11.3* 12.3* 12.8* 9.9*  HCT 36.1* 39.3 41.0 32.3*  MCV 87.6 84.5 85.4 88.7  PLT 312 432* 620* 363   Basic Metabolic Panel: Recent Labs  Lab 12/02/21 0512 12/04/21 0421 12/06/21 0900 12/06/21 1807 12/07/21 0406 12/08/21 0606   NA 136 137 134*  --   --  137  K 4.3 4.3 4.7  --   --  4.4  CL 102 100 98  --   --  104  CO2 --   --  26  GLUCOSE 112* 114* 144*  --   --  121*  BUN 15 17 27*  --   --  20  CREATININE 0.47* 0.44* 0.82  --  0.75 0.55*  CALCIUM 8.5* 9.1 9.1  --   --  8.3*  MG  --   --   --  2.0  --  2.1  PHOS  --   --   --   --   --  2.5   GFR: Estimated Creatinine Clearance: 74 mL/min (A) (by C-G formula based on SCr of 0.55 mg/dL (L)). Liver Function Tests: Recent Labs  Lab 12/06/21 0900 12/08/21 0606  AST 23 25  ALT 19 16  ALKPHOS 70 56  BILITOT 0.5 <0.1*  PROT 6.8 5.2*  ALBUMIN 2.8* 2.0*   No results for input(s): LIPASE, AMYLASE in the last 168 hours. Recent Labs  Lab 12/06/21 0931  AMMONIA 26   Coagulation Profile: Recent Labs  Lab 12/07/21 0406  INR 1.2   Cardiac Enzymes: No results for input(s): CKTOTAL, CKMB, CKMBINDEX, TROPONINI in the last 168 hours. BNP (last 3 results) No results for input(s): PROBNP in the last 8760 hours. HbA1C: No results for input(s): HGBA1C in the last 72 hours. CBG: Recent Labs  Lab 12/04/21 2003 12/04/21 2341 12/05/21 0430 12/05/21 0721 12/05/21 1148  GLUCAP 119* 115* 111* 100* 141*   Lipid Profile: No results for input(s): CHOL, HDL, LDLCALC, TRIG, CHOLHDL, LDLDIRECT in the last 72 hours. Thyroid Function Tests: No results for input(s): TSH, T4TOTAL, FREET4, T3FREE, THYROIDAB in the last 72 hours. Anemia Panel: No results for input(s): VITAMINB12, FOLATE, FERRITIN, TIBC, IRON, RETICCTPCT in the last 72 hours. Urine analysis:    Component Value Date/Time   COLORURINE YELLOW 12/06/2021 0929   APPEARANCEUR HAZY (A) 12/06/2021 0929   LABSPEC 1.025 12/06/2021 0929   PHURINE 6.0 12/06/2021 0929   GLUCOSEU NEGATIVE 12/06/2021 0929   GLUCOSEU NEGATIVE 02/06/2020 1032   HGBUR NEGATIVE 12/06/2021 0929   HGBUR negative 09/16/2010 0839   BILIRUBINUR NEGATIVE 12/06/2021 0929   BILIRUBINUR neg 02/22/2019 0958   KETONESUR 5 (A)  12/06/2021 0929   PROTEINUR NEGATIVE 12/06/2021 0929   UROBILINOGEN 0.2 02/06/2020 1032   NITRITE NEGATIVE 12/06/2021 0929   LEUKOCYTESUR SMALL (A) 12/06/2021 0929   Sepsis Labs: (procalcitonin:4,lacticidven:4)  ) Recent Results (from the past 240 hour(s))  Resp Panel by RT-PCR (Flu A&B, Covid) Nasopharyngeal Swab     Status: None   Collection Time: 11/30/21  1:29 PM   Specimen: Nasopharyngeal Swab; Nasopharyngeal(NP) swabs in vial transport medium  Result Value Ref Range Status   SARS Coronavirus 2 by RT PCR NEGATIVE NEGATIVE Final    Comment: (NOTE) SARS-CoV-2 target nucleic acids are NOT DETECTED.  The SARS-CoV-2 RNA is generally detectable in upper respiratory specimens during the acute phase of infection. The lowest concentration of SARS-CoV-2 viral copies this assay can detect is 138 copies/mL. A negative result does not preclude SARS-Cov-2  infection and should not be used as the sole basis for treatment or other patient management decisions. A negative result may occur with  improper specimen collection/handling, submission of specimen other than nasopharyngeal swab, presence of viral mutation(s) within the areas targeted by this assay, and inadequate number of viral copies(<138 copies/mL). A negative result must be combined with clinical observations, patient history, and epidemiological information. The expected result is Negative.  Fact Sheet for Patients:  BloggerCourse.com  Fact Sheet for Healthcare Providers:  SeriousBroker.it  This test is no t yet approved or cleared by the [place] FDA and  has been authorized for detection and/or diagnosis of SARS-CoV-2 by FDA under an Emergency Use Authorization (EUA). This EUA will remain  in effect (meaning this test can be used) for the duration of the COVID-19 declaration under Section 564(b)(1) of the Act, 21 U.S.C.section 360bbb-3(b)(1), unless the  authorization is terminated  or revoked sooner.       Influenza A by PCR NEGATIVE NEGATIVE Final   Influenza B by PCR NEGATIVE NEGATIVE Final    Comment: (NOTE) The Xpert Xpress SARS-CoV-2/FLU/RSV plus assay is intended as an aid in the diagnosis of influenza from Nasopharyngeal swab specimens and should not be used as a sole basis for treatment. Nasal washings and aspirates are unacceptable for Xpert Xpress SARS-CoV-2/FLU/RSV testing.  Fact Sheet for Patients: BloggerCourse.com  Fact Sheet for Healthcare Providers: SeriousBroker.it  This test is not yet approved or cleared by the [place] FDA and has been authorized for detection and/or diagnosis of SARS-CoV-2 by FDA under an Emergency Use Authorization (EUA). This EUA will remain in effect (meaning this test can be used) for the duration of the COVID-19 declaration under Section 564(b)(1) of the Act, 21 U.S.C. section 360bbb-3(b)(1), unless the authorization is terminated or revoked.  Performed at [facility], [address], [place], [place] [address]   Culture, blood (Routine X 2) w Reflex to ID Panel     Status: None (Preliminary result)   Collection Time: 12/06/21  9:29 AM   Specimen: BLOOD  Result Value Ref Range Status   Specimen Description   Final    BLOOD RIGHT ANTECUBITAL Performed at [facility], [address], [place], [place] [address]    Special Requests   Final    BOTTLES DRAWN AEROBIC AND ANAEROBIC Blood Culture adequate volume Performed at [facility], [address], [place], [place] [address]    Culture   Final    NO GROWTH 2 DAYS Performed at [facility], [address], [place], [place] [address]    Report Status PENDING  Incomplete  Resp Panel by RT-PCR (Flu A&B, Covid) Nasopharyngeal Swab     Status: None   Collection Time: 12/06/21  9:29 AM   Specimen: Nasopharyngeal  Swab; Nasopharyngeal(NP) swabs in vial transport medium  Result Value Ref Range Status   SARS Coronavirus 2 by RT PCR NEGATIVE NEGATIVE Final    Comment: (NOTE) SARS-CoV-2 target nucleic acids are NOT DETECTED.  The SARS-CoV-2 RNA is generally detectable in upper respiratory specimens during the acute phase of infection. The lowest concentration of SARS-CoV-2 viral copies this assay can detect is 138 copies/mL. A negative result does not preclude SARS-Cov-2 infection and should not be used as the sole basis for treatment or other patient management decisions. A negative result may occur with  improper specimen collection/handling, submission of specimen other than nasopharyngeal swab, presence of viral mutation(s) within the areas targeted  by this assay, and inadequate number of viral copies(<138 copies/mL). A negative result must be combined with clinical observations, patient history, and epidemiological information. The expected result is Negative.  Fact Sheet for Patients:  BloggerCourse.com  Fact Sheet for Healthcare Providers:  SeriousBroker.it  This test is no t yet approved or cleared by the [place] FDA and  has been authorized for detection and/or diagnosis of SARS-CoV-2 by FDA under an Emergency Use Authorization (EUA). This EUA will remain  in effect (meaning this test can be used) for the duration of the COVID-19 declaration under Section 564(b)(1) of the Act, 21 U.S.C.section 360bbb-3(b)(1), unless the authorization is terminated  or revoked sooner.       Influenza A by PCR NEGATIVE NEGATIVE Final   Influenza B by PCR NEGATIVE NEGATIVE Final    Comment: (NOTE) The Xpert Xpress SARS-CoV-2/FLU/RSV plus assay is intended as an aid in the diagnosis of influenza from Nasopharyngeal swab specimens and should not be used as a sole basis for treatment. Nasal washings and aspirates are unacceptable for Xpert Xpress  SARS-CoV-2/FLU/RSV testing.  Fact Sheet for Patients: BloggerCourse.com  Fact Sheet for Healthcare Providers: SeriousBroker.it  This test is not yet approved or cleared by the [place] FDA and has been authorized for detection and/or diagnosis of SARS-CoV-2 by FDA under an Emergency Use Authorization (EUA). This EUA will remain in effect (meaning this test can be used) for the duration of the COVID-19 declaration under Section 564(b)(1) of the Act, 21 U.S.C. section 360bbb-3(b)(1), unless the authorization is terminated or revoked.  Performed at [facility], [address], [place], [place] [address]   Culture, blood (Routine X 2) w Reflex to ID Panel     Status: None (Preliminary result)   Collection Time: 12/06/21  4:32 PM   Specimen: BLOOD  Result Value Ref Range Status   Specimen Description   Final    BLOOD LEFT ANTECUBITAL Performed at [facility], [address], [place], [place] [address]    Special Requests   Final    BOTTLES DRAWN AEROBIC ONLY Blood Culture adequate volume Performed at [facility], [address], [place], [place] [address]    Culture   Final    NO GROWTH 2 DAYS Performed at [facility], [address], [place], [place] [address]    Report Status PENDING  Incomplete      Studies: No results found.  Scheduled Meds:  vitamin C  250 mg Per Tube Daily   brinzolamide  1 drop Both Eyes TID   enoxaparin (LOVENOX) injection  40 mg Subcutaneous Q24H   feeding supplement (PROSource TF)  45 mL Per Tube Daily   free water  150 mL Per Tube Q6H   latanoprost  2 drop Both Eyes QHS   levETIRAcetam  500 mg Per Tube BID   melatonin  10 mg Per Tube QHS   memantine  10 mg Per Tube QHS   multivitamin  5 mL Per Tube Daily   Netarsudil Dimesylate  1 drop Both Eyes QHS   valproic acid  1,000 mg Per Tube Daily   valproic acid  500 mg Per  Tube QHS   zinc oxide  1 application Topical BID    Continuous Infusions:  sodium chloride 20 mL/hr at 12/06/21 0942   sodium chloride 50 mL/hr at 12/08/21 0501   ampicillin-sulbactam (UNASYN) IV 3 g (12/08/21 1406)   feeding supplement (JEVITY 1.2 CAL) 1,000 mL (12/08/21  9604)     LOS: 2 days     Darlin Drop, MD [facility] Pager [phone]  If 7PM-7AM, please contact night-coverage [email or web address] Password [email or web address] 12/08/2021, 2:32 PM

## 2021-12-08 NOTE — Progress Notes (Signed)
Palliative:  HPI: 86 y.o. male  with past medical history of dementia, seizures, BPH admitted on 12/06/2021 with recurrent aspiration pneumonia and sepsis.   I met today at [place] bedside along with his wife, Mikle Bosworth, and her friend, Mariann Laster. Lashun is still sleeping but he will respond verbally at times but this is inconsistent. He is on room air and without distress. He continues on tube feeding.   We had a long conversation regarding Sidhant's dementia and aspiration risk and knowledge that he has ongoing aspiration that will continue to cause recurrent pneumonia and decline. We spent time talking about Melo and how active he was prior to his decline with dementia. We discussed the importance of making sure everything we ask him to go through is beneficial to him and what he would want. We discussed poor quality of life and Mikle Bosworth knows that he would not want to be prolonged in the state knowing he will not be able to walk, eat, drink. We discussed that the decision we need to make is how he spends the time he has left. We know when it is his time and God calls him home this will happen regardless of what we do or don't do. With this discussion Mikle Bosworth was very clear about decision for DNR stating that Iren would not want to be resuscitated and prolonged in his current state.   We also discussed how to manage if he has another aspiration pneumonia or further decline due to his dementia. We discussed continuing full scope treatment with rehospitalization vs focusing on doing what we can where he is and allowing help from hospice to focus on making sure that he is comfortable and not suffering. After much thought and consideration Mikle Bosworth has decided to continue to optimize Deano's health here in the hospital but she time for him to return to the facility to have hospice follow to ensure that he is not suffering. She does not want him to be readmitted to the hospital. She asks about transfer from nursing  facility to hospice facility on [facility] when he declines and I explained that sometimes this is an option but something hospice would discuss with here when that time came so they can figure out what would be best for Kealii.   I reviewed with Mikle Bosworth the decision for DNR, continue to optimize his health (continue tube feeding, antibiotics, all other medications as indicated), hospice to follow at facility to ensure he does not suffer when he declines further. I offered to call and speak with her son/daughter but she shares that she will speak with them about these decisions.   All questions/concerns addressed. Emotional support provided. Updated Dr. Nevada Crane and RN.   Exam: Lethargic. Inconsistent verbal response. Does not follow commands. No distress. BUE edema. Breathing regular, unlabored. Abd soft.   Plan: - DNR - Optimize here in hospital: continue tube feeding, antibiotics, all other medications as indicated.  - Hospice to follow upon return to nursing facility.   [place], NP Palliative Medicine Team Pager [phone] (Please see [email or web address] for schedule) Team Phone [phone]    Greater than 50%  of this time was spent counseling and coordinating care related to the above assessment and plan

## 2021-12-09 ENCOUNTER — Inpatient Hospital Stay (HOSPITAL_COMMUNITY): Payer: Medicare Other

## 2021-12-09 LAB — BASIC METABOLIC PANEL
Anion gap: 6 (ref 5–15)
BUN: 19 mg/dL (ref 8–23)
CO2: 30 mmol/L (ref 22–32)
Calcium: 8.4 mg/dL — ABNORMAL LOW (ref 8.9–10.3)
Chloride: 104 mmol/L (ref 98–111)
Creatinine, Ser: 0.39 mg/dL — ABNORMAL LOW (ref 0.61–1.24)
GFR, Estimated: >60 mL/min (ref >60)
Glucose, Bld: 121 mg/dL — ABNORMAL HIGH (ref 70–99)
Potassium: 4.6 mmol/L (ref 3.5–5.1)
Sodium: 140 mmol/L (ref 135–145)

## 2021-12-09 LAB — CBC
HCT: 33 % — ABNORMAL LOW (ref 39.0–52.0)
Hemoglobin: 10.2 g/dL — ABNORMAL LOW (ref 13.0–17.0)
MCH: 27.3 pg (ref 26.0–34.0)
MCHC: 30.9 g/dL (ref 30.0–36.0)
MCV: 88.5 fL (ref 80.0–100.0)
Platelets: 340 10*3/uL (ref 150–400)
RBC: 3.73 MIL/uL — ABNORMAL LOW (ref 4.22–5.81)
RDW: 14.6 % (ref 11.5–15.5)
WBC: 5 10*3/uL (ref 4.0–10.5)
nRBC: 0 % (ref 0.0–0.2)

## 2021-12-09 LAB — PROCALCITONIN: Procalcitonin: 3.67 ng/mL

## 2021-12-09 NOTE — Progress Notes (Signed)
[id number] [employer] [facility]) Hospital Liaison note:  This is a pending outpatient-based Palliative Care patient. Will continue to follow for disposition.  Please call with any outpatient palliative questions or concerns.  Thank you, Abran Cantor, LPN [facility] Liaison [phone]

## 2021-12-09 NOTE — Progress Notes (Signed)
Palliative:  [name] [name] continues to be resting comfortably without distress. Continues with poor secretion management and weak cough but less severe than previous days. He does respond verbally but with baseline confusion due to underlying dementia. Family friend at bedside to visit. Checked multiple times but wife not present at bedside. Family have my contact info for questions/concerns. Continue DNR status, continue current interventions (tube feeding, medications, antibiotics), plan for hospice to follow when he returns to nursing facility.   No charge  Yong Channel, NP Palliative Medicine Team Pager [phone] (Please see [email or web address] for schedule) Team Phone [phone]

## 2021-12-09 NOTE — Progress Notes (Signed)
PROGRESS NOTE  [name] [name] MWU:[id number] DOB: 08-28-1935 DOA: 12/06/2021 PCP: Karna Dupes, MD  HPI/Recap of past 24 hours: [name] [name] is a 86 y.o. male with medical history significant of advanced dementia, seizure disorder, BPH who presented to [facility] ED from SNF with dyspnea and hypoxia.  He was just discharged from the hospital for recurrent aspiration PNA. He was treated and returned to his facility. Reports are that he was increasingly confused and was having increased need for suctioning. He was found to be hypoxic. EMS was activated.  Work-up revealed recurrent aspiration pneumonia with right lower lobe infiltrates on chest x-ray and procalcitonin greater than 14.  He was started on empiric IV antibiotics, IV vancomycin and cefepime.  Hospital course complicated by lethargy.  Palliative care team consulted, ongoing goals of care discussion with family.  12/09/21: Patient was seen and examined at his bedside.  Minimally interactive.  Mild rales noted on exam.  Chest x-ray showing improvement of right lower lobe infiltrates.  Continue IV Unasyn.  Assessment/Plan: Principal Problem:   Multifocal pneumonia  Multifocal pneumonia with concern for aspiration pneumonia, POA History of dysphagia with PEG tube in place. Presented with dyspnea and hypoxia, personally reviewed chest x-ray done on admission which shows right lower lobe infiltrates with concern for aspiration. Repeat chest x-ray done on 12/09/2021 showed some improvement of right lower lobe infiltrates. Procalcitonin greater than 14 on admission, repeat 3.67 on 12/09/2021. Continue Unasyn Antibiotics switched to Unasyn on 12/07/2021. Continue aspiration precautions.  Anemia of chronic disease Hemoglobin 10.2 No overt bleeding Continue to monitor  Resolved post repletion: Hypovolemic hyponatremia Serum sodium 134>> 137, received IV fluid hydration Currently on PEG tube feeding at 45 cc/h.  Moderate protein  calorie malnutrition Serum albumin 2.8> 2.0. Moderate muscle mass loss  Seizure disorder Continue home AEDs through PEG tube Seizure precautions  BPH Continue home regimen via PEG tube Continue to monitor urine output Nonoliguric, urine output 1.2 L in the last 24 hours.  Dementia Continue to reorient as needed Resume home regimen  Severe physical debility Minimally interactive From SNF  Goals of care Ongoing goals of care discussion with palliative care CODE STATUS DNR.  Code Status: DNR.  Family Communication: Updated wife via phone on 05/07/2021.  Disposition Plan: Suspect will require hospice care.   Consultants: Palliative care  Procedures: None  Antimicrobials: Unasyn 12/07/2021  DVT prophylaxis: Subcu Lovenox daily   Status is: Inpatient  Patient requires inpatient status.  Will require at least 2 midnights for further evaluation and treatment of present condition.      Objective: Vitals:   12/08/21 1410 12/08/21 1949 12/09/21 0437 12/09/21 1213  BP:  (!) 146/83 129/78 139/74  Pulse: 76 77 65 73  Resp:  18 15   Temp: 98.9 F (37.2 C) 98.3 F (36.8 C) 98.2 F (36.8 C)   TempSrc: Oral Axillary Axillary   SpO2: 97% 98% 100% 97%  Weight:      Height:        Intake/Output Summary (Last 24 hours) at 12/09/2021 1428 Last data filed at 12/09/2021 0510 Gross per 24 hour  Intake 399.61 ml  Output 1550 ml  Net -1150.39 ml   Filed Weights   12/07/21 0628  Weight: 76.1 kg    Exam:  General: 86 y.o. year-old male frail-appearing no acute distress.  He is minimally interactive. Cardiovascular: Regular rate and rhythm no rubs or gallops.  Respiratory: Mild diffuse rales bilaterally.  Poor inspiratory effort.   Abdomen:  Soft nontender bowel sounds present.  PEG tube in place.   Musculoskeletal: Trace lower extremity edema bilaterally. Skin: Pressure wound, POA. Psychiatry: Unable to assess due to obtundation   Data Reviewed: CBC: Recent  Labs  Lab 12/04/21 0421 12/06/21 0900 12/08/21 0606 12/09/21 0717  WBC 4.0 8.7 6.7 5.0  NEUTROABS  --  7.3 4.4  --   HGB 12.3* 12.8* 9.9* 10.2*  HCT 39.3 41.0 32.3* 33.0*  MCV 84.5 85.4 88.7 88.5  PLT 432* 620* 363 340   Basic Metabolic Panel: Recent Labs  Lab 12/04/21 0421 12/06/21 0900 12/06/21 1807 12/07/21 0406 12/08/21 0606 12/09/21 0717  NA 137 134*  --   --  137 140  K 4.3 4.7  --   --  4.4 4.6  CL 100 98  --   --  104 104  CO2 29 27  --   --  26 30  GLUCOSE 114* 144*  --   --  121* 121*  BUN 17 27*  --   --  20 19  CREATININE 0.44* 0.82  --  0.75 0.55* 0.39*  CALCIUM 9.1 9.1  --   --  8.3* 8.4*  MG  --   --  2.0  --  2.1  --   PHOS  --   --   --   --  2.5  --    GFR: Estimated Creatinine Clearance: 74 mL/min (A) (by C-G formula based on SCr of 0.39 mg/dL (L)). Liver Function Tests: Recent Labs  Lab 12/06/21 0900 12/08/21 0606  AST 23 25  ALT 19 16  ALKPHOS 70 56  BILITOT 0.5 <0.1*  PROT 6.8 5.2*  ALBUMIN 2.8* 2.0*   No results for input(s): LIPASE, AMYLASE in the last 168 hours. Recent Labs  Lab 12/06/21 0931  AMMONIA 26   Coagulation Profile: Recent Labs  Lab 12/07/21 0406  INR 1.2   Cardiac Enzymes: No results for input(s): CKTOTAL, CKMB, CKMBINDEX, TROPONINI in the last 168 hours. BNP (last 3 results) No results for input(s): PROBNP in the last 8760 hours. HbA1C: No results for input(s): HGBA1C in the last 72 hours. CBG: Recent Labs  Lab 12/04/21 2003 12/04/21 2341 12/05/21 0430 12/05/21 0721 12/05/21 1148  GLUCAP 119* 115* 111* 100* 141*   Lipid Profile: No results for input(s): CHOL, HDL, LDLCALC, TRIG, CHOLHDL, LDLDIRECT in the last 72 hours. Thyroid Function Tests: No results for input(s): TSH, T4TOTAL, FREET4, T3FREE, THYROIDAB in the last 72 hours. Anemia Panel: No results for input(s): VITAMINB12, FOLATE, FERRITIN, TIBC, IRON, RETICCTPCT in the last 72 hours. Urine analysis:    Component Value Date/Time   COLORURINE  YELLOW 12/06/2021 0929   APPEARANCEUR HAZY (A) 12/06/2021 0929   LABSPEC 1.025 12/06/2021 0929   PHURINE 6.0 12/06/2021 0929   GLUCOSEU NEGATIVE 12/06/2021 0929   GLUCOSEU NEGATIVE 02/06/2020 1032   HGBUR NEGATIVE 12/06/2021 0929   HGBUR negative 09/16/2010 0839   BILIRUBINUR NEGATIVE 12/06/2021 0929   BILIRUBINUR neg 02/22/2019 0958   KETONESUR 5 (A) 12/06/2021 0929   PROTEINUR NEGATIVE 12/06/2021 0929   UROBILINOGEN 0.2 02/06/2020 1032   NITRITE NEGATIVE 12/06/2021 0929   LEUKOCYTESUR SMALL (A) 12/06/2021 0929   Sepsis Labs: (procalcitonin:4,lacticidven:4)  ) Recent Results (from the past 240 hour(s))  Resp Panel by RT-PCR (Flu A&B, Covid) Nasopharyngeal Swab     Status: None   Collection Time: 11/30/21  1:29 PM   Specimen: Nasopharyngeal Swab; Nasopharyngeal(NP) swabs in vial transport medium  Result Value Ref Range Status  SARS Coronavirus 2 by RT PCR NEGATIVE NEGATIVE Final    Comment: (NOTE) SARS-CoV-2 target nucleic acids are NOT DETECTED.  The SARS-CoV-2 RNA is generally detectable in upper respiratory specimens during the acute phase of infection. The lowest concentration of SARS-CoV-2 viral copies this assay can detect is 138 copies/mL. A negative result does not preclude SARS-Cov-2 infection and should not be used as the sole basis for treatment or other patient management decisions. A negative result may occur with  improper specimen collection/handling, submission of specimen other than nasopharyngeal swab, presence of viral mutation(s) within the areas targeted by this assay, and inadequate number of viral copies(<138 copies/mL). A negative result must be combined with clinical observations, patient history, and epidemiological information. The expected result is Negative.  Fact Sheet for Patients:  BloggerCourse.com  Fact Sheet for Healthcare Providers:  SeriousBroker.it  This test is no t yet  approved or cleared by the [place] FDA and  has been authorized for detection and/or diagnosis of SARS-CoV-2 by FDA under an Emergency Use Authorization (EUA). This EUA will remain  in effect (meaning this test can be used) for the duration of the COVID-19 declaration under Section 564(b)(1) of the Act, 21 U.S.C.section 360bbb-3(b)(1), unless the authorization is terminated  or revoked sooner.       Influenza A by PCR NEGATIVE NEGATIVE Final   Influenza B by PCR NEGATIVE NEGATIVE Final    Comment: (NOTE) The Xpert Xpress SARS-CoV-2/FLU/RSV plus assay is intended as an aid in the diagnosis of influenza from Nasopharyngeal swab specimens and should not be used as a sole basis for treatment. Nasal washings and aspirates are unacceptable for Xpert Xpress SARS-CoV-2/FLU/RSV testing.  Fact Sheet for Patients: BloggerCourse.com  Fact Sheet for Healthcare Providers: SeriousBroker.it  This test is not yet approved or cleared by the [place] FDA and has been authorized for detection and/or diagnosis of SARS-CoV-2 by FDA under an Emergency Use Authorization (EUA). This EUA will remain in effect (meaning this test can be used) for the duration of the COVID-19 declaration under Section 564(b)(1) of the Act, 21 U.S.C. section 360bbb-3(b)(1), unless the authorization is terminated or revoked.  Performed at [facility], [address], [place], [place] [address]   Culture, blood (Routine X 2) w Reflex to ID Panel     Status: None (Preliminary result)   Collection Time: 12/06/21  9:29 AM   Specimen: BLOOD  Result Value Ref Range Status   Specimen Description   Final    BLOOD RIGHT ANTECUBITAL Performed at [facility], [address] [place], [place] [address]    Special Requests   Final    BOTTLES DRAWN AEROBIC AND ANAEROBIC Blood Culture adequate volume Performed at [facility], [address] [place], [place] [address]    Culture   Final    NO GROWTH 3 DAYS Performed at [facility], [address], [place], [place] [address]    Report Status PENDING  Incomplete  Resp Panel by RT-PCR (Flu A&B, Covid) Nasopharyngeal Swab     Status: None   Collection Time: 12/06/21  9:29 AM   Specimen: Nasopharyngeal Swab; Nasopharyngeal(NP) swabs in vial transport medium  Result Value Ref Range Status   SARS Coronavirus 2 by RT PCR NEGATIVE NEGATIVE Final    Comment: (NOTE) SARS-CoV-2 target nucleic acids are NOT DETECTED.  The SARS-CoV-2 RNA is generally detectable in upper respiratory specimens during the acute phase of infection. The lowest concentration of  SARS-CoV-2 viral copies this assay can detect is 138 copies/mL. A negative result does not preclude SARS-Cov-2 infection and should not be used as the sole basis for treatment or other patient management decisions. A negative result may occur with  improper specimen collection/handling, submission of specimen other than nasopharyngeal swab, presence of viral mutation(s) within the areas targeted by this assay, and inadequate number of viral copies(<138 copies/mL). A negative result must be combined with clinical observations, patient history, and epidemiological information. The expected result is Negative.  Fact Sheet for Patients:  BloggerCourse.com  Fact Sheet for Healthcare Providers:  SeriousBroker.it  This test is no t yet approved or cleared by the [place] FDA and  has been authorized for detection and/or diagnosis of SARS-CoV-2 by FDA under an Emergency Use Authorization (EUA). This EUA will remain  in effect (meaning this test can be used) for the duration of the COVID-19 declaration under Section 564(b)(1) of the Act, 21 U.S.C.section 360bbb-3(b)(1), unless the authorization is terminated  or revoked sooner.       Influenza A  by PCR NEGATIVE NEGATIVE Final   Influenza B by PCR NEGATIVE NEGATIVE Final    Comment: (NOTE) The Xpert Xpress SARS-CoV-2/FLU/RSV plus assay is intended as an aid in the diagnosis of influenza from Nasopharyngeal swab specimens and should not be used as a sole basis for treatment. Nasal washings and aspirates are unacceptable for Xpert Xpress SARS-CoV-2/FLU/RSV testing.  Fact Sheet for Patients: BloggerCourse.com  Fact Sheet for Healthcare Providers: SeriousBroker.it  This test is not yet approved or cleared by the [place] FDA and has been authorized for detection and/or diagnosis of SARS-CoV-2 by FDA under an Emergency Use Authorization (EUA). This EUA will remain in effect (meaning this test can be used) for the duration of the COVID-19 declaration under Section 564(b)(1) of the Act, 21 U.S.C. section 360bbb-3(b)(1), unless the authorization is terminated or revoked.  Performed at [facility], [address], [place], [place] [address]   Culture, blood (Routine X 2) w Reflex to ID Panel     Status: None (Preliminary result)   Collection Time: 12/06/21  4:32 PM   Specimen: BLOOD  Result Value Ref Range Status   Specimen Description   Final    BLOOD LEFT ANTECUBITAL Performed at [facility], [address], [place], [place] [address]    Special Requests   Final    BOTTLES DRAWN AEROBIC ONLY Blood Culture adequate volume Performed at [facility], [address], [place], [place] [address]    Culture   Final    NO GROWTH 3 DAYS Performed at [facility], [address], [place], [place] [address]    Report Status PENDING  Incomplete      Studies: DG CHEST PORT 1 VIEW  Result Date: 12/09/2021 CLINICAL DATA:  Rales EXAM: PORTABLE CHEST 1 VIEW COMPARISON:  Chest x-ray dated December 06, 2021 FINDINGS: Cardiac and mediastinal contours are unchanged.  Lower lung predominant heterogeneous opacities. Small left pleural effusion. No evidence of pneumothorax. Multiple right-sided rib fractures which appear chronic. IMPRESSION: 1. Lower lung predominant heterogeneous opacities are unchanged when compared with prior exam, differential includes atelectasis, infection or aspiration. 2. Small left pleural effusion. Electronically Signed   By: Allegra Lai M.D.   On: 12/09/2021 09:45    Scheduled Meds:  vitamin C  250 mg Per Tube Daily   brinzolamide  1 drop Both Eyes TID   enoxaparin (LOVENOX) injection  40  mg Subcutaneous Q24H   feeding supplement (PROSource TF)  45 mL Per Tube Daily   free water  150 mL Per Tube Q6H   latanoprost  2 drop Both Eyes QHS   levETIRAcetam  500 mg Per Tube BID   melatonin  10 mg Per Tube QHS   memantine  10 mg Per Tube QHS   multivitamin  5 mL Per Tube Daily   Netarsudil Dimesylate  1 drop Both Eyes QHS   valproic acid  1,000 mg Per Tube Daily   valproic acid  500 mg Per Tube QHS   zinc oxide  1 application Topical BID    Continuous Infusions:  sodium chloride 20 mL/hr at 12/06/21 0942   ampicillin-sulbactam (UNASYN) IV 3 g (12/09/21 1203)   feeding supplement (JEVITY 1.2 CAL) 1,000 mL (12/08/21 0233)     LOS: 3 days     Darlin Drop, MD [facility] Pager [phone]  If 7PM-7AM, please contact night-coverage [email or web address] Password [email or web address] 12/09/2021, 2:28 PM

## 2021-12-10 DIAGNOSIS — E440 Moderate protein-calorie malnutrition: Secondary | ICD-10-CM | POA: Insufficient documentation

## 2021-12-10 DIAGNOSIS — F0390 Unspecified dementia without behavioral disturbance: Secondary | ICD-10-CM

## 2021-12-10 LAB — PROCALCITONIN: Procalcitonin: 2 ng/mL

## 2021-12-10 MED ORDER — ZINC OXIDE 40 % EX OINT
TOPICAL_OINTMENT | Freq: Three times a day (TID) | CUTANEOUS | Status: DC
  Filled 2021-12-10: qty 57

## 2021-12-10 MED ORDER — AMOXICILLIN-POT CLAVULANATE 875-125 MG PO TABS
1.0000 | ORAL_TABLET | Freq: Two times a day (BID) | ORAL | 0 refills | Status: DC
Start: 2021-12-10 — End: 2021-12-14

## 2021-12-10 MED ORDER — ALBUTEROL SULFATE (2.5 MG/3ML) 0.083% IN NEBU
2.5000 mg | INHALATION_SOLUTION | RESPIRATORY_TRACT | 12 refills | Status: DC | PRN
Start: 2021-12-10 — End: 2021-12-14

## 2021-12-10 MED ORDER — SACCHAROMYCES BOULARDII 250 MG PO CAPS
250.0000 mg | ORAL_CAPSULE | Freq: Two times a day (BID) | ORAL | Status: DC
  Administered 2021-12-10: 250 mg
  Filled 2021-12-10: qty 1

## 2021-12-10 MED ORDER — SACCHAROMYCES BOULARDII 250 MG PO CAPS
250.0000 mg | ORAL_CAPSULE | Freq: Two times a day (BID) | ORAL | 0 refills | Status: DC
Start: 2021-12-10 — End: 2021-12-14

## 2021-12-10 MED ORDER — ADULT MULTIVITAMIN LIQUID CH
5.0000 mL | Freq: Every day | ORAL | 0 refills | Status: DC
Start: 2021-12-11 — End: 2021-12-14

## 2021-12-10 MED ORDER — AMOXICILLIN-POT CLAVULANATE 875-125 MG PO TABS
1.0000 | ORAL_TABLET | Freq: Two times a day (BID) | ORAL | Status: DC
  Administered 2021-12-10: 1
  Filled 2021-12-10: qty 1

## 2021-12-10 MED ORDER — ALBUTEROL SULFATE (2.5 MG/3ML) 0.083% IN NEBU: 2.5000 mg | INHALATION_SOLUTION | RESPIRATORY_TRACT | Status: DC | PRN

## 2021-12-10 NOTE — TOC Progression Note (Signed)
Transition of Care [facility]) - Progression Note    Patient Details  Name: [name] MRN: [id number] Date of Birth: 02-26-1935  Transition of Care [facility]) CM/SW Contact  Geni Bers, RN Phone Number: 12/10/2021, 3:20 PM  Clinical Narrative:     Sharin Mons was called, pt's wife and RN are aware.   Expected Discharge Plan: Skilled Nursing Facility Barriers to Discharge: No Barriers Identified  Expected Discharge Plan and Services Expected Discharge Plan: Skilled Nursing Facility   Discharge Planning Services: CM Consult Post Acute Care Choice: Nursing Home Living arrangements for the past 2 months: Skilled Nursing Facility Expected Discharge Date: 12/10/21                                     Social Determinants of Health (SDOH) Interventions    Readmission Risk Interventions Readmission Risk Prevention Plan 10/22/2021  Transportation Screening Complete  Medication Review [employer]) Referral to Pharmacy  PCP or Specialist appointment within 3-5 days of discharge Complete  HRI or Home Care Consult Not Complete  HRI or Home Care Consult Pt Refusal Comments from SNF  SW Recovery Care/Counseling Consult Complete  Palliative Care Screening Not Applicable  Skilled Nursing Facility Complete  Some recent data might be hidden

## 2021-12-10 NOTE — Progress Notes (Signed)
Attempted to call report to [facility] and was transferred to a VM.

## 2021-12-10 NOTE — Care Management Important Message (Signed)
Important Message  Patient Details IM Letter placed in room for Patients wife [name] Name: [name] [name] MRN: [id number] Date of Birth: [birth year]   Medicare Important Message Given:  Yes     [name] 12/10/2021, 11:51 AM

## 2021-12-10 NOTE — Plan of Care (Signed)
°  Problem: Clinical Measurements: Goal: Respiratory complications will improve Outcome: Adequate for Discharge   Problem: Clinical Measurements: Goal: Cardiovascular complication will be avoided Outcome: Adequate for Discharge   Problem: Elimination: Goal: Will not experience complications related to bowel motility Outcome: Adequate for Discharge   Problem: Skin Integrity: Goal: Risk for impaired skin integrity will decrease Outcome: Adequate for Discharge   Problem: Respiratory: Goal: Ability to maintain adequate ventilation will improve Outcome: Adequate for Discharge

## 2021-12-10 NOTE — Progress Notes (Signed)
Report called to Nurse Supervisor Ailene Ravel) at [facility]. All questions answered. Made aware patient had not received night time medications yet. Vital signs stable.

## 2021-12-10 NOTE — Progress Notes (Signed)
[employer], contracting [facility]) Hospital Liaison: RN note    Notified by Transition of Care Manger, Ezekiel Ina, RN  for referral to [facility]. Spoke with wife to explain services and care. Wife states she does not want to stop artificial feedings and fluids. She would prefer he return to his SNF @ [facility]   When patient transfers back to [facility], [facility] hospice can follow there and provide hospice support .   Please send signed and completed DNR form home with patient/family. Patient will need prescriptions for discharge comfort medications.      [facility] aware of the above. Please notify [facility] when patient is ready to leave the unit at discharge. (Call [phone] or [phone] after 5pm.)   A Please do not hesitate to call with questions.   Thank you,   Elsie Saas, RN, [facility]      [facility] Liaison   [phone]

## 2021-12-10 NOTE — Discharge Summary (Addendum)
Discharge Summary  MAHMOOD SOKOLOW [id number] DOB: 26-Dec-1935  PCP: Raymondo Band, MD  Admit date: 12/06/2021 Discharge date: 12/10/2021  Time spent: 35 minutes   Recommendations for Outpatient Follow-up:  Continue with hospice care. Continue aspiration precautions. Take your medications as prescribed.   Wound care specialist recommendations from 12/10/2021: [place] Nurse Consult Note: Reason for Consult: Pressure injuries to bilateral heels and sacrum. MASD (irritant contact dermatitis due to fecal incontinence). Patient with lartge loose stool at the time of my assessment.  He is crying out in pain during repositioning. Wound type:Pressure,moisture Pressure Injury POA: Yes Measurement: Sacrum: Stage 3   10cm x 8cm with left buttock > right buttock involvement.  Left buttock with 7cm x 6cm x 0.2cm lesion, with 60% red tissue and 40% nonviable tissue.Scant serous drainage Bilateral Heels (medial aspect) Unstageable Pressure Injuries:  black, stable eschar measuring 3cm x 2.2cm. No fluctuance, warmth or induration. No exudate Wound bed:As described above Drainage (amount, consistency, odor) As described above Periwound: intact, dry Dressing procedure/placement/frequency: I will provide the patient with a mattress replacement today and topical care guidance is provided for Nursing to include dressings to the sacrum and heels using xeroform gauze topped with Kerlix (heels) or silicone foam (sacrum). Desitin ointment is provided for the perineal, scrotal, posterior and medial thighs and lower buttocks as a moisture barrier.  Turning and repositioning is in place and bilateral heels have been provided with pressure redistribution heel boots.  Which is a dietitian's recommendations:   Non-severe (moderate) malnutrition in context of chronic illness   INTERVENTION:    -Continue Jevity 1.2 @ 70 ml/hr via PEG -45 ml Prosource TF daily -150 ml free water every 6 hours (600  ml) -Providing 2056 kcals, 104g protein and 1955 ml H2O   NUTRITION DIAGNOSIS:    Moderate Malnutrition related to dysphagia, chronic illness as evidenced by mild fat depletion, moderate muscle depletion.   GOAL:    Patient will meet greater than or equal to 90% of their needs   MONITOR:    Labs, Weight trends, TF tolerance, Skin, I & O's   REASON FOR ASSESSMENT:    Consult Enteral/tube feeding initiation and management   ASSESSMENT:    86 y.o. male with medical history significant of dementia, seizures, BPH. Presenting with dyspnea. History is from wife at bedside and chart review. He was just discharged from the hospital for recurrent aspiration PNA. He was treated and returned to his facility. Reports are that he was increasingly confused overnight. He was having increased need for suctioning.   Patient in room with wife at bedside. Pt sleeping and per wife, he has not been awake very much today. Disoriented x 4. Per RN in [place], pt supposed to have visit from Palliative care today. Pt just discharged this past weekend.  Pt discharged on continuous feeds of Jevity 1.2 @ 70 ml/hr. Pt back with suspected aspiration PNA.  Per documentation ,pt with tan colored secretions.  Tube feeds  were turned off last night but now have resumed at goal rate. Will continue to monitor if rate needs to be decreased. Do not recommend bolus feeds if aspiration risk is this high.   Per weight records, pt has lost 5 lbs since 11/11 (2% wt loss x 1 month, insignificant for time frame).   Medications: Vitamin C, liquid MVI   Labs reviewed.   NUTRITION - FOCUSED PHYSICAL EXAM:   Flowsheet Row Most Recent Value  Orbital Region Mild depletion  Upper Arm Region  Mild depletion  Thoracic and Lumbar Region Unable to assess  Buccal Region Mild depletion  Temple Region Mild depletion  Clavicle Bone Region Mild depletion  Clavicle and Acromion Bone Region Mild depletion  Scapular Bone Region Mild  depletion  Dorsal Hand Unable to assess  [disoriented]  Patellar Region Moderate depletion  Anterior Thigh Region Moderate depletion  Posterior Calf Region Unable to assess  Edema (RD Assessment) None  Hair Reviewed  Eyes Unable to assess  [disoriented]  Mouth Reviewed  Skin Reviewed           Diet Order:   Diet Order                  Diet NPO time specified  Diet effective now                         EDUCATION NEEDS:    No education needs have been identified at this time   Skin:  DTI: bilateral heels Other: skin tears to bilateral pre-tibial area, sacrum, L thigh   Last BM:  12/12 -type 6   Height:       Ht Readings from Last 1 Encounters:  12/06/21 6\' 1"  (1.854 m)      Weight:       Wt Readings from Last 1 Encounters:  12/07/21 76.1 kg      BMI:  Body mass index is 22.13 kg/m.   Estimated Nutritional Needs:    Kcal:  2000-2200   Protein:  100-115g   Fluid:  2L/day    Discharge Diagnoses:  Active Hospital Problems   Diagnosis Date Noted   Multifocal pneumonia 12/06/2021   Malnutrition of moderate degree 12/10/2021    Resolved Hospital Problems  No resolved problems to display.    Discharge Condition: Stable  Diet recommendation: Continue PEG tube feeding with aspiration precautions.  Vitals:   12/10/21 0700 12/10/21 1203  BP: (!) 153/106 136/90  Pulse: 68 73  Resp: 19   Temp: 98.7 F (37.1 C) 97.7 F (36.5 C)  SpO2: 100% 99%    History of present illness:  ONEL KONG is a 86 y.o. male with medical history significant of advanced dementia, seizure disorder, BPH who presented to [facility] ED from SNF with dyspnea and hypoxia.  He was just discharged from the hospital for recurrent aspiration PNA. He was treated and returned to his skilled nursing facility.  He was increasingly confused and was having increased need for oral suctioning.  He was found to be hypoxic at the facility. EMS was activated.  Work-up revealed recurrent  aspiration pneumonia with right lower lobe infiltrates on chest x-ray and procalcitonin greater than 14.  He was started on empiric IV antibiotics, IV vancomycin and cefepime.  Later switched to IV Unasyn with improvement of his symptomatology.   Hospital course complicated by lethargy.  Palliative care team consulted, ongoing goals of care discussion with family.  Family made decision to discharge to SNF with hospice care.   12/10/21: Patient seen and examined at his bedside.  He is more alert.  He has no new complaints.  Vital signs and labs reviewed and are stable.  Updated his wife via phone.  Hospital Course:  Principal Problem:   Multifocal pneumonia Active Problems:   Malnutrition of moderate degree  Resolved sepsis 2/2 Multifocal pneumonia with concern for recurrent aspiration pneumonia, POA History of dysphagia with PEG tube in place. Presented with dyspnea and hypoxia, personally reviewed chest x-ray  done on admission which shows right lower lobe infiltrates with concern for aspiration. Repeat chest x-ray done on 12/09/2021 showed some improvement of right lower lobe infiltrates. Procalcitonin greater than 14 on admission, repeat 2.0 on 12/10/2021.   Received 3 days of Unasyn Unasyn switched to Augmentin on 12/10/21 x 7 days. Florastor 250 mg twice daily x 20 days. High risk for recurrent aspiration. Continue aspiration precautions.   Anemia of chronic disease Hemoglobin uptrending 10.2 No overt bleeding Stable   Resolved post repletion: Hypovolemic hyponatremia   Moderate protein calorie malnutrition Serum albumin 2.8> 2.0. Moderate muscle mass loss PEG tube feeding in place with aspiration precautions.   Seizure disorder Continue home AEDs through PEG tube Continue seizure precautions   BPH Continue home regimen via PEG tube No urinary retention reported.   Dementia Continue to reorient as needed Continue home regimen   Severe physical debility Fall  precautions Aspiration precautions   Goals of care Ongoing goals of care discussion with palliative care CODE STATUS DNR. Discharge to SNF with hospice care.   Code Status: DNR.   Family Communication: Updated his wife via phone on 12/10/2021.        Consultants: Palliative care   Procedures: None   Antimicrobials: Unasyn 12/07/2021>> 12/10/2021 Augmentin 12/10/21 x 7 days     Discharge Exam: BP 136/90 (BP Location: Left Arm)    Pulse 73    Temp 97.7 F (36.5 C) (Oral)    Resp 19    Ht 6\' 1"  (1.854 m)    Wt 76.1 kg    SpO2 99%    BMI 22.13 kg/m  General: 86 y.o. year-old male frail-appearing in no acute distress.  Alert and interactive. Cardiovascular: Regular rate and rhythm with no rubs or gallops.  No thyromegaly or JVD noted.   Respiratory: Clear to auscultation with no wheezes or rales.  Poor inspiratory effort. Abdomen: Soft nontender nondistended with normal bowel sounds x4 quadrants.  PEG tube in place. Musculoskeletal: Trace lower extremity edema bilaterally. Skin: Pressure wound, POA Psychiatry: Mood is appropriate for condition and setting  Discharge Instructions You were cared for by a hospitalist during your hospital stay. If you have any questions about your discharge medications or the care you received while you were in the hospital after you are discharged, you can call the unit and asked to speak with the hospitalist on call if the hospitalist that took care of you is not available. Once you are discharged, your primary care physician will handle any further medical issues. Please note that NO REFILLS for any discharge medications will be authorized once you are discharged, as it is imperative that you return to your primary care physician (or establish a relationship with a primary care physician if you do not have one) for your aftercare needs so that they can reassess your need for medications and monitor your lab values.   Allergies as of 12/10/2021        Reactions   Chocolate    Itching if he eats too much chocolate    Ciprofloxacin    Achilles tendon pain    Lipitor [atorvastatin]    Muscle aches         Medication List     STOP taking these medications    donepezil 10 MG tablet Commonly known as: ARICEPT   geriatric multivitamins-minerals Liqd       TAKE these medications    acetaminophen 500 MG tablet Commonly known as: TYLENOL Place 1 tablet (500 mg  total) into feeding tube every 8 (eight) hours as needed. What changed: reasons to take this   albuterol (2.5 MG/3ML) 0.083% nebulizer solution Commonly known as: PROVENTIL Take 3 mLs (2.5 mg total) by nebulization every 2 (two) hours as needed for wheezing or shortness of breath.   amoxicillin-clavulanate 875-125 MG tablet Commonly known as: AUGMENTIN Place 1 tablet into feeding tube every 12 (twelve) hours for 7 days.   Azopt 1 % ophthalmic suspension Generic drug: brinzolamide Place 1 drop into both eyes 3 (three) times daily.   feeding supplement (JEVITY 1.2 CAL) Liqd Place 1,000 mLs into feeding tube continuous. What changed: additional instructions   feeding supplement (PROSource TF) liquid Place 45 mLs into feeding tube daily. What changed: Another medication with the same name was changed. Make sure you understand how and when to take each.   fish oil-omega-3 fatty acids 1000 MG capsule Place 1 capsule (1 g total) into feeding tube daily.   free water Soln Place 150 mLs into feeding tube every 6 (six) hours.   Glucosamine 500 MG Caps Take 500 mg by mouth daily.   guaiFENesin 100 MG/5ML liquid Commonly known as: ROBITUSSIN Place 15 mLs into feeding tube every 4 (four) hours as needed for cough or to loosen phlegm. What changed: how much to take   latanoprost 0.005 % ophthalmic solution Commonly known as: XALATAN Place 2 drops into both eyes at bedtime.   levETIRAcetam 100 MG/ML solution Commonly known as: KEPPRA Place 5 mLs (500 mg  total) into feeding tube 2 (two) times daily.   melatonin 5 MG Tabs Place 2 tablets (10 mg total) into feeding tube at bedtime.   memantine 10 MG tablet Commonly known as: NAMENDA Place 1 tablet (10 mg total) into feeding tube at bedtime.   multivitamin Liqd Place 5 mLs into feeding tube daily. Start taking on: December 11, 2021   Rhopressa 0.02 % Soln Generic drug: Netarsudil Dimesylate Place 1 drop into both eyes at bedtime.   saccharomyces boulardii 250 MG capsule Commonly known as: FLORASTOR Place 1 capsule (250 mg total) into feeding tube 2 (two) times daily for 20 days.   triamcinolone ointment 0.1 % Commonly known as: KENALOG Apply 1 application topically every 8 (eight) hours as needed (irritation).   valproic acid 250 MG/5ML solution Commonly known as: DEPAKENE Place 20 mLs (1,000 mg total) into feeding tube daily. What changed:  how much to take when to take this additional instructions   vitamin C 250 MG tablet Commonly known as: ASCORBIC ACID Place 1 tablet (250 mg total) into feeding tube daily.   zinc oxide 20 % ointment Apply topically 2 (two) times daily. What changed:  how much to take additional instructions       Allergies  Allergen Reactions   Chocolate     Itching if he eats too much chocolate    Ciprofloxacin     Achilles tendon pain    Lipitor [Atorvastatin]     Muscle aches     Follow-up Information     Raymondo Band, MD. Call today.   Specialty: Family Medicine Why: Please call for a post hospital follow up appointment Contact information: [place] [phone]         Lelon Perla, MD .   Specialty: Cardiology Contact information: [address] [place] [address] [phone]                  The results of  significant diagnostics from this hospitalization (including imaging, microbiology, ancillary and laboratory) are listed below for reference.     Significant Diagnostic Studies: CT ABDOMEN PELVIS WO CONTRAST  Result Date: 11/23/2021 CLINICAL DATA:  Preoperative evaluation for gastric tube placement. EXAM: CT ABDOMEN AND PELVIS WITHOUT CONTRAST TECHNIQUE: Multidetector CT imaging of the abdomen and pelvis was performed following the standard protocol without IV contrast. COMPARISON:  08/20/2014.  05/25/2012. FINDINGS: Lower chest: Bilateral pleural effusions larger on the left than the right with dependent atelectasis and or infiltrate, worse in the left lower lobe than the right lower lobe. Cardiomegaly. Small amount of pericardial fluid. Hepatobiliary: Liver appears normal without contrast. No calcified gallstones. Pancreas: Normal Spleen: Normal Adrenals/Urinary Tract: Adrenal glands are normal. Left kidney is normal. Right kidney shows a 17 mm calcified mass in the ventral upper pole. This is not enlarged and is actually slightly smaller than was seen in 2013 and 2015. This was felt previously to represent a benign calcified hemorrhagic or proteinaceous cyst, and the follow-up would be consistent with that diagnosis. No progressive or worrisome finding. Bladder is thick-walled. Stomach/Bowel: Nasogastric tube enters the stomach. Stomach appears normally positioned. There is no interposed colon presently between the body and antrum of the stomach and the anterior abdominal wall. The inferior aspect of the left lobe does overlie the lesser curvature region. No small bowel pathology is seen. Question the presence of a rectal mass. Digital examination recommended. Vascular/Lymphatic: Aortic atherosclerosis. No aneurysm. IVC is normal. No adenopathy. Reproductive: Normal except for enlarged prostate. Other: No free fluid or air. Small left inguinal hernia containing only fat. Musculoskeletal: This chronic degenerative changes affect the spine. IMPRESSION: Bilateral pleural effusions, larger on the left than the right, with atelectasis in both lower  lobes, left worse than right. Cardiomegaly.  Pericardial effusion. No unexpected gastric anatomic findings. No interposed colon between the anterior wall of the body and antrum of the stomach and the anterior abdominal wall. Chronic 1.7 cm calcified region at the upper pole the right kidney, actually smaller than seen 79 years ago, consistent with a benign entity. See above discussion. Enlarged prostate. Question rectal mass versus proctitis. Thick-walled bladder suggesting cystitis or chronic outlet obstruction. Small left inguinal hernia containing only fat. Electronically Signed   By: Nelson Chimes M.D.   On: 11/23/2021 19:08   DG Pelvis 1-2 Views  Result Date: 11/19/2021 CLINICAL DATA:  Unresponsiveness. EXAM: PELVIS - 1-2 VIEW COMPARISON:  None. FINDINGS: Evaluation is limited on the provided images. Faint linear subcapital lucency through the neck of the left femur concerning for a nondisplaced fracture. Dedicated left hip radiograph or CT is recommended for better evaluation. No dislocation. The bones are osteopenic. Degenerative changes of the lumbar spine. The soft tissues are unremarkable. IMPRESSION: Findings concerning for a nondisplaced left femoral neck fracture. Dedicated left hip radiograph or CT is recommended for better evaluation. Electronically Signed   By: Anner Crete M.D.   On: 11/19/2021 03:41   DG Abd 1 View  Result Date: 11/21/2021 CLINICAL DATA:  NG placement. EXAM: ABDOMEN - 1 VIEW COMPARISON:  Earlier radiograph dated 11/19/2021. FINDINGS: Feeding tube with weighted tip in the upper abdomen likely in the proximal stomach. Small left and possibly trace right pleural effusion. There is bibasilar atelectasis or infiltrate. Bilateral hazy and interstitial densities may represent edema. Pneumonia is not excluded. There is cardiomegaly. No acute osseous pathology. IMPRESSION: Feeding tube with weighted tip in the proximal stomach. Electronically Signed   By: Laren Everts.D.  On: 11/21/2021 03:57   CT HEAD WO CONTRAST (5MM)  Result Date: 12/06/2021 CLINICAL DATA:  Encephalopathy, altered mental status EXAM: CT HEAD WITHOUT CONTRAST TECHNIQUE: Contiguous axial images were obtained from the base of the skull through the vertex without intravenous contrast. COMPARISON:  11/18/2021 FINDINGS: Brain: No acute infarct, hemorrhage, mass, mass effect, or midline shift. No extra-axial collection or hydrocephalus. Unchanged generalized atrophy. Periventricular white matter changes, likely the sequela of chronic small vessel ischemic disease. Lacunar infarcts in the basal ganglia. Vascular: No hyperdense vessel. Skull: Normal. Negative for fracture or focal lesion. Sinuses/Orbits: Postsurgical changes in the paranasal sinuses. Redemonstrated left maxillary mucous retention cyst. Mucosal thickening in the hypoplastic right maxillary sinus and ethmoid air cells. Other: None. IMPRESSION: No acute intracranial process. Electronically Signed   By: Merilyn Baba M.D.   On: 12/06/2021 19:13   CT HEAD WO CONTRAST (5MM)  Result Date: 11/18/2021 CLINICAL DATA:  Delirium. EXAM: CT HEAD WITHOUT CONTRAST TECHNIQUE: Contiguous axial images were obtained from the base of the skull through the vertex without intravenous contrast. COMPARISON:  10/25/2021. FINDINGS: Brain: No acute intracranial hemorrhage, midline shift or mass effect. Generalized atrophy is noted. Subcortical and periventricular white matter hypodensities are seen bilaterally. There is no hydrocephalus. Vascular: No hyperdense vessel or unexpected calcification. Skull: Normal. Negative for fracture or focal lesion. Sinuses/Orbits: Round density is present in the left maxillary sinus, possible mucosal retention cyst or polyp. There is partial opacification of the ethmoid air cells bilaterally. The orbits are stable. Other: None. IMPRESSION: Stable head CT with no acute intracranial process. Electronically Signed   By: Brett Fairy M.D.    On: 11/18/2021 20:48   CT Chest Wo Contrast  Result Date: 11/18/2021 CLINICAL DATA:  Chest pain, shortness of breath. Altered mental status. Recent pneumonia. Recent COVID positive. EXAM: CT CHEST WITHOUT CONTRAST TECHNIQUE: Multidetector CT imaging of the chest was performed following the standard protocol without IV contrast. COMPARISON:  None. FINDINGS: Cardiovascular: Mild cardiomegaly. Small pericardial effusion is present. Calcified plaque over the left anterior descending and lateral circumflex coronary arteries. No evidence of thoracic aortic aneurysm. Calcified plaque is present over the thoracic aorta. Remaining vascular structures are unremarkable. Mediastinum/Nodes: No definite mediastinal or hilar adenopathy. Remaining mediastinal structures are unremarkable. Lungs/Pleura: Lungs are adequately inflated and demonstrate patchy hazy opacification over the mid to lower lungs bilaterally. Consolidation over the left base likely compressive atelectasis with small to moderate left effusion. Airways are normal. Upper Abdomen: Minimal calcified plaque over the abdominal aorta. Oval partially calcified rounded mass over the upper pole right kidney slightly smaller compared to 2015. No acute findings. Musculoskeletal: Degenerative change of the spine. No focal abnormality. IMPRESSION: 1. Patchy bilateral hazy airspace process which may be due to infection bacterial or viral origin. Small to moderate left pleural effusion with associated left basilar consolidation likely compressive atelectasis. 2. Mild cardiomegaly with small pericardial effusion. Atherosclerotic coronary artery disease. 3. Aortic atherosclerosis. 4. Oval partially calcified rounded mass over the upper pole right kidney slightly smaller compared to 2015. Aortic Atherosclerosis (ICD10-I70.0). Electronically Signed   By: Marin Olp M.D.   On: 11/18/2021 20:52   CT PELVIS WO CONTRAST  Result Date: 11/19/2021 CLINICAL DATA:  Left femur  fracture. EXAM: CT PELVIS WITHOUT CONTRAST TECHNIQUE: Multidetector CT imaging of the pelvis was performed following the standard protocol without intravenous contrast. COMPARISON:  Radiograph of same day. FINDINGS: Urinary Tract:  No abnormality visualized. Bowel: Rectal wall thickening is noted with surrounding inflammatory changes concerning for proctitis.  Vascular/Lymphatic: Atherosclerosis of visualized portion of abdominal aorta is noted. No adenopathy is noted. Reproductive:  Mild prostatic enlargement is noted. Other:  No definite ascites or hernia is noted. Musculoskeletal: No definite fracture is noted. IMPRESSION: No definite fracture or other significant bony abnormality is noted. Rectal wall thickening is noted with surrounding inflammatory changes concerning for proctitis. Aortic Atherosclerosis (ICD10-I70.0). Electronically Signed   By: Marijo Conception M.D.   On: 11/19/2021 13:32   IR GASTROSTOMY TUBE MOD SED  Result Date: 11/26/2021 INDICATION: 86 year old gentleman with dementia, pneumonia, dysphagia requires feeding tube access. EXAM: Percutaneous gastrostomy tube placement MEDICATIONS: Ancef 2 g IV; Antibiotics were administered within 1 hour of the procedure. Glucagon 1 mg IV ANESTHESIA/SEDATION: Moderate (conscious) sedation was employed during this procedure. A total of Versed 1 mg and Fentanyl 50 mcg was administered intravenously by the radiology nurse. Total intra-service moderate Sedation Time: 16 minutes. The patient's level of consciousness and vital signs were monitored continuously by radiology nursing throughout the procedure under my direct supervision. CONTRAST:  15 mL of Omnipaque 300-administered into the gastric lumen. FLUOROSCOPY TIME:  Fluoroscopy Time: 1 minutes 42 seconds (8 mGy). COMPLICATIONS: None immediate. PROCEDURE: Informed written consent was obtained from the patient after a thorough discussion of the procedural risks, benefits and alternatives. All questions were  addressed. Maximal Sterile Barrier Technique was utilized including caps, mask, sterile gowns, sterile gloves, sterile drape, hand hygiene and skin antiseptic. A timeout was performed prior to the initiation of the procedure. The left hepatic lobe margin was marked utilizing ultrasound guidance. The epigastric region was prepped and draped in the usual sterile fashion. The stomach was insufflated utilizing the NG tube. Following local lidocaine administration, 2 gastropexies were placed to secure the anterior wall of the stomach to the anterior abdominal wall. Percutaneous access obtained into the gastric antrum at the center of the gastropexies with an 18 gauge needle. Guide wire advanced into the gastric lumen. Serial dilation was performed and peel-away sheath was placed. 35 French gastrostomy tube inserted over the guidewire into the gastric lumen. The G tube retention balloon was inflated with 10 mL of dilute contrast and retracted to the anterior gastric wall. Contrast administrated through the gastrostomy tube opacified the gastric lumen. The insertion site was covered with sterile dressing. IMPRESSION: 54 French gastrostomy tube placement as above. Electronically Signed   By: Miachel Roux M.D.   On: 11/26/2021 16:12   CT FEMUR LEFT WO CONTRAST  Result Date: 11/19/2021 CLINICAL DATA:  Suspected femoral fracture EXAM: CT OF THE LOWER LEFT EXTREMITY WITHOUT CONTRAST TECHNIQUE: Multidetector CT imaging of the lower left extremity was performed according to the standard protocol. COMPARISON:  Pelvic x-ray earlier the same day. FINDINGS: Bones/Joint/Cartilage No acute fracture identified, specifically in the subcapital region of the femur which was suspected on plain film. Mild degenerative changes of the hip and moderate degenerative changes of the knee noted. Chondrocalcinosis noted in the knee medial and lateral compartments. Ligaments Suboptimally assessed by CT. Muscles and Tendons Calcific densities in  the proximal hamstring tendons suggestive of chronic tendinosis. No obvious significant muscle abnormality identified. Soft tissues Mild subcutaneous fat stranding edema in the thigh. Arterial vascular calcifications noted. IMPRESSION: No acute fracture or dislocation identified. Other chronic findings as described. Electronically Signed   By: Ofilia Neas M.D.   On: 11/19/2021 13:39   DG CHEST PORT 1 VIEW  Result Date: 12/09/2021 CLINICAL DATA:  Rales EXAM: PORTABLE CHEST 1 VIEW COMPARISON:  Chest x-ray dated December 06, 2021 FINDINGS: Cardiac and mediastinal contours are unchanged. Lower lung predominant heterogeneous opacities. Small left pleural effusion. No evidence of pneumothorax. Multiple right-sided rib fractures which appear chronic. IMPRESSION: 1. Lower lung predominant heterogeneous opacities are unchanged when compared with prior exam, differential includes atelectasis, infection or aspiration. 2. Small left pleural effusion. Electronically Signed   By: Allegra Lai M.D.   On: 12/09/2021 09:45   DG Chest Port 1 View  Result Date: 12/06/2021 CLINICAL DATA:  Cough, increased secretions EXAM: PORTABLE CHEST 1 VIEW COMPARISON:  11/18/2021 FINDINGS: Stable cardiomediastinal contours. Patchy bibasilar airspace opacities, right worse than left. Possible trace bilateral pleural effusions. No pneumothorax. IMPRESSION: Patchy bibasilar airspace opacities, right worse than left, suspicious for pneumonia. Electronically Signed   By: Duanne Guess D.O.   On: 12/06/2021 10:25   DG Chest Port 1 View  Result Date: 11/18/2021 CLINICAL DATA:  Possible sepsis. Altered mental status and shortness of breath. EXAM: PORTABLE CHEST 1 VIEW COMPARISON:  10/25/2021 FINDINGS: Lungs are somewhat hypoinflated with continued hazy opacification over the lung bases left worse than right. Left effusion and atelectasis is likely. Infection in the lung bases is possible. Cardiomediastinal silhouette and  remainder of the exam is unchanged. IMPRESSION: Stable bibasilar opacification left worse than right likely effusions with atelectasis. Infection in the lung bases is possible. Electronically Signed   By: Elberta Fortis M.D.   On: 11/18/2021 19:38   DG Abd Portable 1V  Result Date: 11/19/2021 CLINICAL DATA:  NG-tube. EXAM: PORTABLE ABDOMEN - 1 VIEW COMPARISON:  Abdominal x-ray 10/25/2021. FINDINGS: Enteric tube tip is at the level of the gastric antrum. Bowel-gas pattern is nonobstructive. There are small densities in the right upper quadrant, possibly related to bowel content. No acute fractures are seen. IMPRESSION: Enteric tube tip is at the level of the gastric antrum. Electronically Signed   By: Darliss Cheney M.D.   On: 11/19/2021 19:43   DG Swallowing Func-Speech Pathology  Result Date: 11/22/2021 Table formatting from the original result was not included. Objective Swallowing Evaluation: Type of Study: MBS-Modified Barium Swallow Study  Patient Details Name: URIAH PHILIPSON MRN: [id number] Date of Birth: 12/08/1935 Today's Date: 11/22/2021 Time: SLP Start Time (ACUTE ONLY): 1345 -SLP Stop Time (ACUTE ONLY): 1405 SLP Time Calculation (min) (ACUTE ONLY): 20 min Past Medical History: Past Medical History: Diagnosis Date  Allergy   Dementia (HCC)   sees Dr. Patrcia Dolly   Diverticulosis of colon (without mention of hemorrhage) 03-01-2004, 04-04-2011  Colonoscopy  ED (erectile dysfunction)   Glaucoma   sees Dr. Arvil Chaco   Hyperglycemia   Hyperlipidemia   Hypertension   Internal hemorrhoids 03-15-1999  Flex Sig   Irritable bowel  Past Surgical History: Past Surgical History: Procedure Laterality Date  COLONOSCOPY  04-04-11  per Dr. Jarold Motto, clear, no repeats needed  EYE SURGERY    bilateral cataract extraction per Dr Jettie Pagan  INGUINAL HERNIA REPAIR    KNEE ARTHROSCOPY    right knee  NASAL SINUS SURGERY   HPI: DARREL GLOSS is a 86 y.o. male with known history of dementia, seizures, BPH, decubitus ulcers,  pericardial effusion anemia was recently admitted for pneumonia discharged about 2 weeks ago was brought to the ER after patient was found to be increasingly confused and lethargic. CT chest was showing bilateral infiltrates concerning for pneumonia also was showing mild pericardial effusion. Most recent MBS 10/28/21 recommending Dys 2/honey thick and pt was able to upgrade to Dys 2/nectar prior to discharge.  Subjective: lethargic but  able to particiate in minimal amount of PO intake and follow basic directions  Recommendations for follow up therapy are one component of a multi-disciplinary discharge planning process, led by the attending physician.  Recommendations may be updated based on patient status, additional functional criteria and insurance authorization. Assessment / Plan / Recommendation Clinical Impressions 11/22/2021 Clinical Impression Patient presents with a mild oral and a severe pharyngeal phase dysphagia which is significantly declined as compared to MBS documented from 3 weeks ago. NG feeding tube in place during today's study and was in place during prior MBS as well. During today's MBS, patient was lethargic and required cues to maintain alertness. He was able to follow basic directions to swallow again, cough. First PO administered was puree texture and majority of this bolus become retained in vallecular and pyriform sinus. He exhibited one instance of sensed aspiration (PAS 7) of moderate amount of puree which occured after the swallow and was from pyriform sinus residuals. Several other instances of aspiration occured, all of which were during or after swallows and all were silent. (PAS 8). SLP then gave patient spoon sip of nectar thick liquids, followed by spoon sip of thin liquid barium, both of which did not aid in reducing vallecular and sinus residuals. Very small amount of barium contrast would transit through UES with each swallow. Patient was able to cough and transit penetrate,  aspirate out of laryngeal vestibule, but not consistently. There was a mild reduction in amount of vallecular and pyriform sinus residuals with multiple cued cough/throat clear and reswallows, dry swallows, however unable to achieve full clearance of pharyngeal residuals by end of study. SLP is recommending continue with NPO status and prognosis of PO advancement is guarded at this time. SLP Visit Diagnosis Dysphagia, oropharyngeal phase (R13.12) Attention and concentration deficit following -- Frontal lobe and executive function deficit following -- Impact on safety and function Severe aspiration risk;Risk for inadequate nutrition/hydration   Treatment Recommendations 11/22/2021 Treatment Recommendations Therapy as outlined in treatment plan below   Prognosis 11/22/2021 Prognosis for Safe Diet Advancement Guarded Barriers to Reach Goals Severity of deficits;Cognitive deficits Barriers/Prognosis Comment -- Diet Recommendations 11/22/2021 SLP Diet Recommendations NPO Liquid Administration via -- Medication Administration Via alternative means Compensations -- Postural Changes --   Other Recommendations 11/22/2021 Recommended Consults -- Oral Care Recommendations Oral care QID;Staff/trained caregiver to provide oral care Other Recommendations -- Follow Up Recommendations Skilled nursing-short term rehab (<3 hours/day) Assistance recommended at discharge Frequent or constant Supervision/Assistance Functional Status Assessment Patient has had a recent decline in their functional status and/or demonstrates limited ability to make significant improvements in function in a reasonable and predictable amount of time Frequency and Duration  11/22/2021 Speech Therapy Frequency (ACUTE ONLY) min 1 x/week Treatment Duration 1 week   Oral Phase 11/22/2021 Oral Phase Impaired Oral - Pudding Teaspoon -- Oral - Pudding Cup -- Oral - Honey Teaspoon -- Oral - Honey Cup NT Oral - Nectar Teaspoon Delayed oral transit;Reduced posterior  propulsion Oral - Nectar Cup NT Oral - Nectar Straw NT Oral - Thin Teaspoon Delayed oral transit;Reduced posterior propulsion Oral - Thin Cup NT Oral - Thin Straw NT Oral - Puree Delayed oral transit Oral - Mech Soft -- Oral - Regular NT Oral - Multi-Consistency -- Oral - Pill NT Oral Phase - Comment --  Pharyngeal Phase 11/22/2021 Pharyngeal Phase Impaired Pharyngeal- Pudding Teaspoon -- Pharyngeal -- Pharyngeal- Pudding Cup -- Pharyngeal -- Pharyngeal- Honey Teaspoon NT Pharyngeal -- Pharyngeal- Honey Cup NT Pharyngeal -- Pharyngeal-  Nectar Teaspoon Reduced anterior laryngeal mobility;Reduced airway/laryngeal closure;Penetration/Apiration after swallow;Moderate aspiration;Pharyngeal residue - valleculae;Pharyngeal residue - pyriform Pharyngeal -- Pharyngeal- Nectar Cup NT Pharyngeal -- Pharyngeal- Nectar Straw -- Pharyngeal -- Pharyngeal- Thin Teaspoon Delayed swallow initiation-vallecula;Reduced anterior laryngeal mobility;Reduced airway/laryngeal closure;Moderate aspiration;Penetration/Apiration after swallow;Penetration/Aspiration during swallow;Pharyngeal residue - valleculae;Pharyngeal residue - pyriform Pharyngeal -- Pharyngeal- Thin Cup NT Pharyngeal -- Pharyngeal- Thin Straw NT Pharyngeal -- Pharyngeal- Puree Delayed swallow initiation-vallecula;Penetration/Apiration after swallow;Moderate aspiration;Pharyngeal residue - pyriform;Pharyngeal residue - valleculae;Reduced anterior laryngeal mobility;Reduced airway/laryngeal closure Pharyngeal -- Pharyngeal- Mechanical Soft -- Pharyngeal -- Pharyngeal- Regular NT Pharyngeal -- Pharyngeal- Multi-consistency -- Pharyngeal -- Pharyngeal- Pill NT Pharyngeal -- Pharyngeal Comment --  Cervical Esophageal Phase  11/22/2021 Cervical Esophageal Phase Impaired Pudding Teaspoon -- Pudding Cup -- Honey Teaspoon -- Honey Cup -- Nectar Teaspoon Reduced cricopharyngeal relaxation Nectar Cup -- Nectar Straw -- Thin Teaspoon Reduced cricopharyngeal relaxation Thin Cup Reduced  cricopharyngeal relaxation Thin Straw -- Puree Reduced cricopharyngeal relaxation Mechanical Soft -- Regular -- Multi-consistency -- Pill -- Cervical Esophageal Comment -- Angela NevinJohn T. Preston, MA, CCC-SLP Speech Therapy                     EEG adult  Result Date: 11/19/2021 Charlsie QuestYadav, Priyanka O, MD     11/19/2021 12:57 PM Patient Name: Natasha BenceJoe L Couvillon MRN: [id number] Epilepsy Attending: Charlsie QuestPriyanka O Yadav Referring Physician/Provider: Dr Albertine GratesFang Xu Date: 11/19/2021 Duration: 22.41 mins Patient history: 86 year old male with altered mental status.  EEG to evaluate for seizure. Level of alertness: Awake, asleep AEDs during EEG study: LEV Technical aspects: This EEG study was done with scalp electrodes positioned according to the 10-20 International system of electrode placement. Electrical activity was acquired at a sampling rate of 500Hz  and reviewed with a high frequency filter of 70Hz  and a low frequency filter of 1Hz . EEG data were recorded continuously and digitally stored. Description: During awake state, no clear posterior dominant rhythm was seen.  Sleep was characterized by sleep spindles (12 to 14 Hz), maximal frontocentral region.  EEG showed continuous generalized predominantly 5 to 6 Hz theta as well as intermittent generalized 2 to 3 Hz delta slowing. Hyperventilation and photic stimulation were not performed.   ABNORMALITY - Continuous slow, generalized IMPRESSION: This study is suggestive of moderate diffuse encephalopathy, nonspecific etiology. No seizures or epileptiform discharges were seen throughout the recording. Charlsie Questriyanka O Yadav   ECHOCARDIOGRAM COMPLETE  Result Date: 11/19/2021    ECHOCARDIOGRAM REPORT   Patient Name:   Natasha BenceJOE L Kotz Date of Exam: 11/19/2021 Medical Rec #:  [id number]        Height:       73.0 in Accession #:    [id number]       Weight:       166.0 lb Date of Birth:  1935-01-01        BSA:          1.988 m Patient Age:    86 years         BP:           130/69 mmHg Patient  Gender: M                HR:           67 bpm. Exam Location:  Inpatient Procedure: 2D Echo, Cardiac Doppler and Color Doppler Indications:    Pericardial effusion I31.3  History:        Patient has prior history of Echocardiogram examinations, most  recent 02/09/2021. Arrythmias:RBBB, Signs/Symptoms:Dementia; Risk                 Factors:Hypertension and Dyslipidemia. Recurrent pneumonia.                 History of pericardial effusion.  Sonographer:    Darlina Sicilian RDCS Referring Phys: [place]  1. Left ventricular ejection fraction, by estimation, is 60 to 65%. The left ventricle has normal function. The left ventricle has no regional wall motion abnormalities. Left ventricular diastolic parameters are consistent with Grade II diastolic dysfunction (pseudonormalization). Elevated left ventricular end-diastolic pressure.  2. Right ventricular systolic function is normal. The right ventricular size is normal. There is normal pulmonary artery systolic pressure.  3. The pericardial effusion is posterior to the left ventricle and lateral to the left ventricle.  4. The mitral valve is abnormal. Trivial mitral valve regurgitation. No evidence of mitral stenosis.  5. The aortic valve is normal in structure. There is mild calcification of the aortic valve. Aortic valve regurgitation is mild to moderate. Aortic valve sclerosis/calcification is present, without any evidence of aortic stenosis.  6. Aortic dilatation noted. There is mild dilatation of the aortic root, measuring 39 mm. There is mild dilatation of the ascending aorta, measuring 41 mm.  7. The inferior vena cava is normal in size with greater than 50% respiratory variability, suggesting right atrial pressure of 3 mmHg. FINDINGS  Left Ventricle: Left ventricular ejection fraction, by estimation, is 60 to 65%. The left ventricle has normal function. The left ventricle has no regional wall motion abnormalities. The left  ventricular internal cavity size was normal in size. There is  no left ventricular hypertrophy. Left ventricular diastolic parameters are consistent with Grade II diastolic dysfunction (pseudonormalization). Elevated left ventricular end-diastolic pressure. Right Ventricle: The right ventricular size is normal. No increase in right ventricular wall thickness. Right ventricular systolic function is normal. There is normal pulmonary artery systolic pressure. The tricuspid regurgitant velocity is 2.28 m/s, and  with an assumed right atrial pressure of 3 mmHg, the estimated right ventricular systolic pressure is [id number]. Left Atrium: Left atrial size was normal in size. Right Atrium: Right atrial size was normal in size. Pericardium: Trivial pericardial effusion is present. The pericardial effusion is posterior to the left ventricle and lateral to the left ventricle. Mitral Valve: The mitral valve is abnormal. There is mild thickening of the mitral valve leaflet(s). Trivial mitral valve regurgitation. No evidence of mitral valve stenosis. Tricuspid Valve: The tricuspid valve is normal in structure. Tricuspid valve regurgitation is mild . No evidence of tricuspid stenosis. Aortic Valve: The aortic valve is normal in structure. There is mild calcification of the aortic valve. Aortic valve regurgitation is mild to moderate. Aortic regurgitation PHT measures 690 msec. Aortic valve sclerosis/calcification is present, without any evidence of aortic stenosis. Pulmonic Valve: The pulmonic valve was normal in structure. Pulmonic valve regurgitation is mild. No evidence of pulmonic stenosis. Aorta: The aortic root is normal in size and structure and aortic dilatation noted. There is mild dilatation of the aortic root, measuring 39 mm. There is mild dilatation of the ascending aorta, measuring 41 mm. Venous: The inferior vena cava is normal in size with greater than 50% respiratory variability, suggesting right atrial pressure  of 3 mmHg. IAS/Shunts: No atrial level shunt detected by color flow Doppler.  LEFT VENTRICLE PLAX 2D LVIDd:         5.40 cm   Diastology LVIDs:  3.10 cm   LV e' medial:    3.73 cm/s LV PW:         0.80 cm   LV E/e' medial:  18.3 LV IVS:        1.10 cm   LV e' lateral:   3.89 cm/s LVOT diam:     2.30 cm   LV E/e' lateral: 17.5 LV SV:         64 LV SV Index:   32 LVOT Area:     4.15 cm                           3D Volume EF:                          3D EF:        63 %                          LV EDV:       130 ml                          LV ESV:       49 ml                          LV SV:        81 ml RIGHT VENTRICLE RV S prime:     12.90 cm/s TAPSE (M-mode): 2.0 cm LEFT ATRIUM             Index        RIGHT ATRIUM           Index LA diam:        2.70 cm 1.36 cm/m   RA Area:     10.30 cm LA Vol (A2C):   33.0 ml 16.60 ml/m  RA Volume:   17.80 ml  8.95 ml/m LA Vol (A4C):   26.7 ml 13.43 ml/m LA Biplane Vol: 30.5 ml 15.34 ml/m  AORTIC VALVE LVOT Vmax:   82.90 cm/s LVOT Vmean:  58.800 cm/s LVOT VTI:    0.155 m AI PHT:      690 msec  AORTA Ao Root diam: 3.90 cm Ao STJ diam:  3.1 cm Ao Asc diam:  4.05 cm MITRAL VALVE               TRICUSPID VALVE MV Area (PHT): 2.91 cm    TR Peak grad:   20.8 mmHg MV Decel Time: 261 msec    TR Vmax:        228.00 cm/s MV E velocity: 68.10 cm/s MV A velocity: 74.10 cm/s  SHUNTS MV E/A ratio:  0.92        Systemic VTI:  0.16 m                            Systemic Diam: 2.30 cm Jenkins Rouge MD Electronically signed by Jenkins Rouge MD Signature Date/Time: 11/19/2021/11:10:26 AM    Final    VAS [place] UPPER EXTREMITY VENOUS DUPLEX  Result Date: 11/22/2021 UPPER VENOUS STUDY  Patient Name:  BISMARK STEVESON  Date of Exam:   11/21/2021 Medical Rec #: [id number]         Accession #:    [id number] Date of Birth: [birth year]  Patient Gender: M Patient Age:   57 years Exam Location:  [facility] Procedure:      VAS [place] UPPER EXTREMITY VENOUS DUPLEX Referring Phys: Annamaria Boots XU  --------------------------------------------------------------------------------  Indications: Swelling Limitations: Patient immobilty. Comparison Study: No previous exams Performing Technologist: Jody Hill RVT, RDMS  Examination Guidelines: A complete evaluation includes B-mode imaging, spectral Doppler, color Doppler, and power Doppler as needed of all accessible portions of each vessel. Bilateral testing is considered an integral part of a complete examination. Limited examinations for reoccurring indications may be performed as noted.  Right Findings: +----------+------------+---------+-----------+----------+-------+  RIGHT      Compressible Phasicity Spontaneous Properties Summary  +----------+------------+---------+-----------+----------+-------+  IJV            Full        Yes        Yes                         +----------+------------+---------+-----------+----------+-------+  Subclavian     Full        Yes        Yes                         +----------+------------+---------+-----------+----------+-------+  Axillary       Full        Yes        Yes                         +----------+------------+---------+-----------+----------+-------+  Brachial       Full                                               +----------+------------+---------+-----------+----------+-------+  Radial         Full                                               +----------+------------+---------+-----------+----------+-------+  Ulnar          Full                                               +----------+------------+---------+-----------+----------+-------+  Cephalic       Full                                               +----------+------------+---------+-----------+----------+-------+  Basilic        Full        Yes        Yes                         +----------+------------+---------+-----------+----------+-------+  Left Findings: +----------+------------+---------+-----------+----------+-------+  LEFT        Compressible Phasicity Spontaneous Properties Summary  +----------+------------+---------+-----------+----------+-------+  Subclavian     Full        Yes        Yes                         +----------+------------+---------+-----------+----------+-------+  Summary:  Right: No evidence of deep vein thrombosis in the upper extremity. No evidence of superficial vein thrombosis in the upper extremity.  Left: No evidence of thrombosis in the subclavian.  *See table(s) above for measurements and observations.  Diagnosing physician: Orlie Pollen Electronically signed by Orlie Pollen on 11/22/2021 at 10:23:16 AM.    Final     Microbiology: Recent Results (from the past 240 hour(s))  Culture, blood (Routine X 2) w Reflex to ID Panel     Status: None (Preliminary result)   Collection Time: 12/06/21  9:29 AM   Specimen: BLOOD  Result Value Ref Range Status   Specimen Description   Final    BLOOD RIGHT ANTECUBITAL Performed at [facility], [place] [address] [place], [place] [address]    Special Requests   Final    BOTTLES DRAWN AEROBIC AND ANAEROBIC Blood Culture adequate volume Performed at [place] [address] [place], [place] [address]    Culture   Final    NO GROWTH 4 DAYS Performed at [facility], [place] [address], [place], [place] [address]    Report Status PENDING  Incomplete  Resp Panel by RT-PCR (Flu A&B, Covid) Nasopharyngeal Swab     Status: None   Collection Time: 12/06/21  9:29 AM   Specimen: Nasopharyngeal Swab; Nasopharyngeal(NP) swabs in vial transport medium  Result Value Ref Range Status   SARS Coronavirus 2 by RT PCR NEGATIVE NEGATIVE Final    Comment: (NOTE) SARS-CoV-2 target nucleic acids are NOT DETECTED.  The SARS-CoV-2 RNA is generally detectable in upper respiratory specimens during the acute phase of infection. The lowest concentration of SARS-CoV-2 viral copies this assay can detect is 138 copies/mL. A negative  result does not preclude SARS-Cov-2 infection and should not be used as the sole basis for treatment or other patient management decisions. A negative result may occur with  improper specimen collection/handling, submission of specimen other than nasopharyngeal swab, presence of viral mutation(s) within the areas targeted by this assay, and inadequate number of viral copies(<138 copies/mL). A negative result must be combined with clinical observations, patient history, and epidemiological information. The expected result is Negative.  Fact Sheet for Patients:  EntrepreneurPulse.com.au  Fact Sheet for Healthcare Providers:  IncredibleEmployment.be  This test is no t yet approved or cleared by the [place] FDA and  has been authorized for detection and/or diagnosis of SARS-CoV-2 by FDA under an Emergency Use Authorization (EUA). This EUA will remain  in effect (meaning this test can be used) for the duration of the COVID-19 declaration under Section 564(b)(1) of the Act, 21 U.S.C.section 360bbb-3(b)(1), unless the authorization is terminated  or revoked sooner.       Influenza A by PCR NEGATIVE NEGATIVE Final   Influenza B by PCR NEGATIVE NEGATIVE Final    Comment: (NOTE) The Xpert Xpress SARS-CoV-2/FLU/RSV plus assay is intended as an aid in the diagnosis of influenza from Nasopharyngeal swab specimens and should not be used as a sole basis for treatment. Nasal washings and aspirates are unacceptable for Xpert Xpress SARS-CoV-2/FLU/RSV testing.  Fact Sheet for Patients: EntrepreneurPulse.com.au  Fact Sheet for Healthcare Providers: IncredibleEmployment.be  This test is not yet approved or cleared by the [place] FDA and has been authorized for detection and/or diagnosis of SARS-CoV-2 by FDA under an Emergency Use Authorization (EUA). This EUA will remain in effect (meaning this test can be used)  for the duration of the COVID-19 declaration under Section 564(b)(1)  of the Act, 21 U.S.C. section 360bbb-3(b)(1), unless the authorization is terminated or revoked.  Performed at [facility], [address] [place], [place] [address]   Culture, blood (Routine X 2) w Reflex to ID Panel     Status: None (Preliminary result)   Collection Time: 12/06/21  4:32 PM   Specimen: BLOOD  Result Value Ref Range Status   Specimen Description   Final    BLOOD LEFT ANTECUBITAL Performed at [facility] [address], [place], [place] [address]    Special Requests   Final    BOTTLES DRAWN AEROBIC ONLY Blood Culture adequate volume Performed at [facility] [address], [place], [place] [address]    Culture   Final    NO GROWTH 4 DAYS Performed at [facility], [place] [address], [place], [place] [address]    Report Status PENDING  Incomplete     Labs: Basic Metabolic Panel: Recent Labs  Lab 12/04/21 0421 12/06/21 0900 12/06/21 1807 12/07/21 0406 12/08/21 0606 12/09/21 0717  NA 137 134*  --   --  137 140  K 4.3 4.7  --   --  4.4 4.6  CL 100 98  --   --  104 104  CO2 29 27  --   --  26 30  GLUCOSE 114* 144*  --   --  121* 121*  BUN 17 27*  --   --  20 19  CREATININE 0.44* 0.82  --  0.75 0.55* 0.39*  CALCIUM 9.1 9.1  --   --  8.3* 8.4*  MG  --   --  2.0  --  2.1  --   PHOS  --   --   --   --  2.5  --    Liver Function Tests: Recent Labs  Lab 12/06/21 0900 12/08/21 0606  AST 23 25  ALT 19 16  ALKPHOS 70 56  BILITOT 0.5 <0.1*  PROT 6.8 5.2*  ALBUMIN 2.8* 2.0*   No results for input(s): LIPASE, AMYLASE in the last 168 hours. Recent Labs  Lab 12/06/21 0931  AMMONIA 26   CBC: Recent Labs  Lab 12/04/21 0421 12/06/21 0900 12/08/21 0606 12/09/21 0717  WBC 4.0 8.7 6.7 5.0  NEUTROABS  --  7.3 4.4  --   HGB 12.3* 12.8* 9.9* 10.2*  HCT 39.3 41.0 32.3* 33.0*  MCV 84.5 85.4 88.7 88.5  PLT 432* 620* 363  340   Cardiac Enzymes: No results for input(s): CKTOTAL, CKMB, CKMBINDEX, TROPONINI in the last 168 hours. BNP: BNP (last 3 results) Recent Labs    05/09/21 0232 11/22/21 0401 12/06/21 0900  BNP 114.1* 126.6* 80.1    ProBNP (last 3 results) No results for input(s): PROBNP in the last 8760 hours.  CBG: Recent Labs  Lab 12/04/21 2003 12/04/21 2341 12/05/21 0430 12/05/21 0721 12/05/21 1148  GLUCAP 119* 115* 111* 100* 141*       Signed:  Kayleen Memos, MD [facility] 12/10/2021, 2:58 PM

## 2021-12-10 NOTE — Consult Note (Signed)
WOC Nurse Consult Note: Reason for Consult: Pressure injuries to bilateral heels and sacrum. MASD (irritant contact dermatitis due to fecal incontinence). Patient with lartge loose stool at the time of my assessment.  He is crying out in pain during repositioning. Wound type:Pressure,moisture Pressure Injury POA: Yes Measurement: Sacrum: Stage 3   10cm x 8cm with left buttock > right buttock involvement.  Left buttock with 7cm x 6cm x 0.2cm lesion, with 60% red tissue and 40% nonviable tissue.Scant serous drainage Bilateral Heels (medial aspect) Unstageable Pressure Injuries:  black, stable eschar measuring 3cm x 2.2cm. No fluctuance, warmth or induration. No exudate Wound bed:As described above Drainage (amount, consistency, odor) As described above Periwound: intact, dry Dressing procedure/placement/frequency: I will provide the patient with a mattress replacement today and topical care guidance is provided for Nursing to include dressings to the sacrum and heels using xeroform gauze topped with Kerlix (heels) or silicone foam (sacrum). Desitin ointment is provided for the perineal, scrotal, posterior and medial thighs and lower buttocks as a moisture barrier.  Turning and repositioning is in place and bilateral heels have been provided with pressure redistribution heel boots.  WOC nursing team will not follow, but will remain available to this patient, the nursing and medical teams.  Please re-consult if needed. Thanks, Ladona Mow, MSN, RN, GNP, [place]  Pager# [phone]

## 2021-12-10 NOTE — Progress Notes (Signed)
Attempted to call report to [facility]. Transferred to voicemail. Left RN's name and call back number.

## 2021-12-10 NOTE — Progress Notes (Signed)
Attempted to call [facility] #[phone] twice and no one is answering the phone.

## 2021-12-10 NOTE — Progress Notes (Signed)
Palliative:  HPI: 86 y.o. male  with past medical history of dementia, seizures, BPH admitted on 12/06/2021 with recurrent aspiration pneumonia and sepsis.   I met today at [name] [name] bedside and no family present. He is verbal and speaks to me. Underlying confusion. No distress. I called and spoke with wife, [name] [name]. I reviewed with her our past discussion was very involved but she denies having any questions or concerns. She reports that she did speak with their two children regarding her decisions and they agree with her. They have my contact information if they had any questions or concerns. She confirms continued goal to optimize, continue tube feeding and medications as indicated, return to facility with hospice in place, and DNR status.   All questions/concerns addressed. Emotional support provided.   Exam: Alert, confused at baseline. No distress. Verbal with more consistent responses today. Breathing regular unlabored. Abd soft. Generalized weakness and fatigue.   Plan: - DNR.  - Optimize here in hospital: continue tube feeding, antibiotics, all other medications as indicated.  - Hospice to follow upon return to nursing facility.   15 min  Vinie Sill, NP Palliative Medicine Team Pager [phone] (Please see [email or web address] for schedule) Team Phone [phone]    Greater than 50%  of this time was spent counseling and coordinating care related to the above assessment and plan

## 2021-12-10 NOTE — TOC Progression Note (Signed)
Transition of Care [facility]) - Progression Note    Patient Details  Name: SAWYER MENTZER MRN: [id number] Date of Birth: 1935/07/08  Transition of Care [facility]) CM/SW Contact  Geni Bers, RN Phone Number: 12/10/2021, 12:44 PM  Clinical Narrative:    A call to [facility] was made. [facility] will follow pt at [facility]. A call to pt's wife was made, Mrs. Cuevas has paid for 30 days at [facility] and agreed with him returning.   Expected Discharge Plan: Skilled Nursing Facility Barriers to Discharge: No Barriers Identified  Expected Discharge Plan and Services Expected Discharge Plan: Skilled Nursing Facility   Discharge Planning Services: CM Consult Post Acute Care Choice: Nursing Home Living arrangements for the past 2 months: Skilled Nursing Facility                                       Social Determinants of Health (SDOH) Interventions    Readmission Risk Interventions Readmission Risk Prevention Plan 10/22/2021  Transportation Screening Complete  Medication Review [facility]) Referral to Pharmacy  PCP or Specialist appointment within 3-5 days of discharge Complete  HRI or Home Care Consult Not Complete  HRI or Home Care Consult Pt Refusal Comments from SNF  SW Recovery Care/Counseling Consult Complete  Palliative Care Screening Not Applicable  Skilled Nursing Facility Complete  Some recent data might be hidden

## 2021-12-11 LAB — CULTURE, BLOOD (ROUTINE X 2)
Culture: NO GROWTH
Culture: NO GROWTH
Special Requests: ADEQUATE
Special Requests: ADEQUATE

## 2021-12-12 ENCOUNTER — Emergency Department (HOSPITAL_COMMUNITY)
Admission: EM | Admit: 2021-12-12 | Discharge: 2021-12-14 | Disposition: A | Discharge status: Home / Self Care | Payer: Medicare Other | Attending: Emergency Medicine | Admitting: Emergency Medicine

## 2021-12-12 ENCOUNTER — Emergency Department (HOSPITAL_COMMUNITY): Payer: Medicare Other

## 2021-12-12 ENCOUNTER — Encounter (HOSPITAL_COMMUNITY): Payer: Self-pay

## 2021-12-12 ENCOUNTER — Other Ambulatory Visit: Payer: Self-pay

## 2021-12-12 DIAGNOSIS — Z8616 Personal history of COVID-19: Secondary | ICD-10-CM | POA: Diagnosis not present

## 2021-12-12 DIAGNOSIS — I10 Essential (primary) hypertension: Secondary | ICD-10-CM | POA: Diagnosis not present

## 2021-12-12 DIAGNOSIS — Z515 Encounter for palliative care: Secondary | ICD-10-CM | POA: Diagnosis not present

## 2021-12-12 DIAGNOSIS — R600 Localized edema: Secondary | ICD-10-CM | POA: Diagnosis not present

## 2021-12-12 DIAGNOSIS — J690 Pneumonitis due to inhalation of food and vomit: Secondary | ICD-10-CM | POA: Diagnosis not present

## 2021-12-12 DIAGNOSIS — R0981 Nasal congestion: Secondary | ICD-10-CM | POA: Diagnosis not present

## 2021-12-12 DIAGNOSIS — F03C0 Unspecified dementia, severe, without behavioral disturbance, psychotic disturbance, mood disturbance, and anxiety: Secondary | ICD-10-CM

## 2021-12-12 DIAGNOSIS — Z20822 Contact with and (suspected) exposure to covid-19: Secondary | ICD-10-CM | POA: Insufficient documentation

## 2021-12-12 DIAGNOSIS — R531 Weakness: Secondary | ICD-10-CM | POA: Diagnosis not present

## 2021-12-12 DIAGNOSIS — Z87891 Personal history of nicotine dependence: Secondary | ICD-10-CM | POA: Diagnosis not present

## 2021-12-12 DIAGNOSIS — F0390 Unspecified dementia without behavioral disturbance: Secondary | ICD-10-CM | POA: Diagnosis not present

## 2021-12-12 DIAGNOSIS — Z7189 Other specified counseling: Secondary | ICD-10-CM | POA: Diagnosis not present

## 2021-12-12 DIAGNOSIS — J988 Other specified respiratory disorders: Secondary | ICD-10-CM

## 2021-12-12 MED ORDER — ONDANSETRON 4 MG PO TBDP: 4.0000 mg | ORAL_TABLET | Freq: Four times a day (QID) | ORAL | Status: DC | PRN

## 2021-12-12 MED ORDER — ACETAMINOPHEN 650 MG RE SUPP
650.0000 mg | Freq: Four times a day (QID) | RECTAL | Status: DC | PRN
  Administered 2021-12-13: 20:00:00 650 mg via RECTAL
  Filled 2021-12-12: qty 1

## 2021-12-12 MED ORDER — GLYCOPYRROLATE 0.2 MG/ML IJ SOLN: 0.2000 mg | INTRAMUSCULAR | Status: DC | PRN

## 2021-12-12 MED ORDER — BIOTENE DRY MOUTH MT LIQD: 15.0000 mL | OROMUCOSAL | Status: DC | PRN

## 2021-12-12 MED ORDER — GLYCOPYRROLATE 1 MG PO TABS
1.0000 mg | ORAL_TABLET | ORAL | Status: DC | PRN
  Filled 2021-12-12: qty 1

## 2021-12-12 MED ORDER — GLYCOPYRROLATE 0.2 MG/ML IJ SOLN
0.4000 mg | Freq: Four times a day (QID) | INTRAMUSCULAR | Status: DC
  Administered 2021-12-12 – 2021-12-14 (×7): 0.4 mg via INTRAVENOUS
  Filled 2021-12-12 (×7): qty 2

## 2021-12-12 MED ORDER — MORPHINE SULFATE (CONCENTRATE) 10 MG/0.5ML PO SOLN: 5.0000 mg | ORAL | Status: DC | PRN

## 2021-12-12 MED ORDER — HALOPERIDOL LACTATE 5 MG/ML IJ SOLN: 0.5000 mg | INTRAMUSCULAR | Status: DC | PRN

## 2021-12-12 MED ORDER — POLYVINYL ALCOHOL 1.4 % OP SOLN: 1.0000 [drp] | Freq: Four times a day (QID) | OPHTHALMIC | Status: DC | PRN

## 2021-12-12 MED ORDER — HALOPERIDOL LACTATE 2 MG/ML PO CONC
0.5000 mg | ORAL | Status: DC | PRN
  Filled 2021-12-12: qty 0.3

## 2021-12-12 MED ORDER — MORPHINE SULFATE (CONCENTRATE) 10 MG/0.5ML PO SOLN
5.0000 mg | ORAL | Status: DC | PRN
  Administered 2021-12-13 – 2021-12-14 (×2): 5 mg via SUBLINGUAL
  Filled 2021-12-12 (×2): qty 0.5

## 2021-12-12 MED ORDER — BISACODYL 10 MG RE SUPP: 10.0000 mg | Freq: Every day | RECTAL | Status: DC | PRN

## 2021-12-12 MED ORDER — ACETAMINOPHEN 325 MG PO TABS: 650.0000 mg | ORAL_TABLET | Freq: Four times a day (QID) | ORAL | Status: DC | PRN

## 2021-12-12 MED ORDER — HALOPERIDOL 1 MG PO TABS: 0.5000 mg | ORAL_TABLET | ORAL | Status: DC | PRN

## 2021-12-12 MED ORDER — ONDANSETRON HCL 4 MG/2ML IJ SOLN
4.0000 mg | Freq: Four times a day (QID) | INTRAMUSCULAR | Status: DC | PRN
  Administered 2021-12-12: 21:00:00 4 mg via INTRAVENOUS
  Filled 2021-12-12: qty 2

## 2021-12-12 NOTE — ED Triage Notes (Signed)
BIB [facility] from [place]. Wife called EMS because pt was having difficulty breathing and was gurgling. Pt was not suctioned at the facility due to facility not having suction.

## 2021-12-12 NOTE — Progress Notes (Addendum)
[facility] ED [facility]) Hospital Liaison Note  Received request from Transitions of Care Manager [place], [place] for family interest in [facility]. Spoke with wife Alvira Philips to confirm interest and explain services.  Approval for [facility] is determined by [facility] MD. Once [facility] MD has determined [facility] eligibility, [facility] will update hospital staff and family.  Please do not hesitate to call with any hospice related questions.    Thank you for the opportunity to participate in this patient's care.   Bobbie "Einar Gip, RN, BSN [facility] Liaison [phone]

## 2021-12-12 NOTE — ED Provider Notes (Signed)
[facility] DEPT Provider Note   CSN: [id number] Arrival date & time: 12/12/21  1246     History No chief complaint on file.   [name] [name] is a 86 y.o. male.  HPI Patient returns from nursing facility where he was discharged to from the hospital 2 days ago.  He returns with concern for increasing secretions, and staff having no ability to provide suctioning services.  No staff report of acute changes, fever, change in interactivity.  Patient has dementia, level 5 caveat.  Additional details on chart review including documentation from palliative care consult last week, discharge summary and admission.  In short patient was admitted for recurrent aspiration pneumonia, discharged with ongoing hospice services antibiotics and wound care to nursing facility.    Past Medical History:  Diagnosis Date   Allergy    Dementia [facility])    sees Dr. Ellouise Newer    Diverticulosis of colon (without mention of hemorrhage) 03-01-2004, 04-04-2011   Colonoscopy   ED (erectile dysfunction)    Glaucoma    sees Dr. Crecencio Mc    Hyperglycemia    Hyperlipidemia    Hypertension    Internal hemorrhoids 03-15-1999   Flex Sig    Irritable bowel     Patient Active Problem List   Diagnosis Date Noted   Malnutrition of moderate degree 12/10/2021   Multifocal pneumonia 99991111   Acute metabolic encephalopathy 99991111   Acute encephalopathy 11/18/2021   Recurrent pneumonia 10/20/2021   Pressure injury of skin 05/11/2021   Pericardial effusion, acute    COVID    Palliative care by specialist    Goals of care, counseling/discussion    General weakness    Syncope 05/07/2021   Seizure ([place]) 03/09/2021   Dementia ([place]) 02/25/2021   BPH with urinary obstruction 12/22/2020   Ankle edema, bilateral 07/13/2020   Hallucinations 02/09/2020   Abdominal pain, acute 08/21/2014   GI bleeding 05/26/2012   Renal mass 05/26/2012   Diverticulosis of colon with hemorrhage  05/26/2012   SHOULDER PAIN, BILATERAL 07/30/2010   HERPES SIMPLEX INFECTION 10/10/2007   TACHYCARDIA, PAROXYSMAL NOS 10/10/2007   Dyslipidemia 07/11/2007   Essential hypertension 07/11/2007   ALLERGIC RHINITIS 07/11/2007    Past Surgical History:  Procedure Laterality Date   COLONOSCOPY  04-04-11   per Dr. Sharlett Iles, clear, no repeats needed   EYE SURGERY     bilateral cataract extraction per Dr Dolores Lory   INGUINAL HERNIA REPAIR     IR GASTROSTOMY TUBE MOD SED  11/26/2021   KNEE ARTHROSCOPY     right knee   NASAL SINUS SURGERY         Family History  Problem Relation Age of Onset   Hypertension Mother    Lung cancer Father    Colitis Daughter     Social History   Tobacco Use   Smoking status: Former    Types: Cigarettes    Quit date: 03/20/1969    Years since quitting: 52.7   Smokeless tobacco: Never  Vaping Use   Vaping Use: Never used  Substance Use Topics   Alcohol use: Yes    Alcohol/week: 0.0 standard drinks    Comment: occ   Drug use: No    Home Medications Prior to Admission medications   Medication Sig Start Date End Date Taking? Authorizing Provider  acetaminophen (TYLENOL) 500 MG tablet Place 1 tablet (500 mg total) into feeding tube every 8 (eight) hours as needed. Patient taking differently: Place 500 mg into feeding  tube every 8 (eight) hours as needed for mild pain. 11/30/21   [name], MD  albuterol (PROVENTIL) (2.5 MG/3ML) 0.083% nebulizer solution Take 3 mLs (2.5 mg total) by nebulization every 2 (two) hours as needed for wheezing or shortness of breath. 12/10/21   Darlin Drop, DO  amoxicillin-clavulanate (AUGMENTIN) 875-125 MG tablet Place 1 tablet into feeding tube every 12 (twelve) hours for 7 days. 12/10/21 12/17/21  Darlin Drop, DO  AZOPT 1 % ophthalmic suspension Place 1 drop into both eyes 3 (three) times daily. 12/18/19   [provider]  fish oil-omega-3 fatty acids 1000 MG capsule Place 1 capsule (1 g total) into feeding  tube daily. 11/30/21   [name], MD  Glucosamine 500 MG CAPS Take 500 mg by mouth daily.    [provider]  guaiFENesin (ROBITUSSIN) 100 MG/5ML liquid Place 15 mLs into feeding tube every 4 (four) hours as needed for cough or to loosen phlegm. Patient taking differently: Place 300 mLs into feeding tube every 4 (four) hours as needed for cough or to loosen phlegm. 11/30/21   [name], MD  latanoprost (XALATAN) 0.005 % ophthalmic solution Place 2 drops into both eyes at bedtime.    [provider]  levETIRAcetam (KEPPRA) 100 MG/ML solution Place 5 mLs (500 mg total) into feeding tube 2 (two) times daily. 11/30/21   [name], MD  melatonin 5 MG TABS Place 2 tablets (10 mg total) into feeding tube at bedtime. 11/30/21   [name], MD  memantine (NAMENDA) 10 MG tablet Place 1 tablet (10 mg total) into feeding tube at bedtime. 11/30/21   [name], MD  Multiple Vitamin (MULTIVITAMIN) LIQD Place 5 mLs into feeding tube daily. 12/11/21 03/11/22  Darlin Drop, DO  Nutritional Supplements (FEEDING SUPPLEMENT, JEVITY 1.2 CAL,) LIQD Place 1,000 mLs into feeding tube continuous. Patient taking differently: Place 1,000 mLs into feeding tube continuous. 54ml/hour 11/30/21   [name], MD  Nutritional Supplements (FEEDING SUPPLEMENT, PROSOURCE TF,) liquid Place 45 mLs into feeding tube daily. 12/01/21   [name], MD  RHOPRESSA 0.02 % SOLN Place 1 drop into both eyes at bedtime. 08/17/19   [provider]  saccharomyces boulardii (FLORASTOR) 250 MG capsule Place 1 capsule (250 mg total) into feeding tube 2 (two) times daily for 20 days. 12/10/21 12/30/21  Darlin Drop, DO  triamcinolone ointment (KENALOG) 0.1 % Apply 1 application topically every 8 (eight) hours as needed (irritation).    [provider]  valproic acid (DEPAKENE) 250 MG/5ML solution Place 20 mLs (1,000 mg total) into feeding tube daily. Patient taking differently: Place  500-1,000 mg into feeding tube See admin instructions. 1000 mg daily and 500 mg at bedtime 12/01/21   [name], MD  vitamin C (ASCORBIC ACID) 250 MG tablet Place 1 tablet (250 mg total) into feeding tube daily. 11/30/21   [name], MD  Water For Irrigation, Sterile (FREE WATER) SOLN Place 150 mLs into feeding tube every 6 (six) hours. 11/30/21   [name], MD  zinc oxide 20 % ointment Apply topically 2 (two) times daily. Patient taking differently: Apply 1 application topically 2 (two) times daily. To buttocks for wound care 11/30/21   [name], MD    Allergies    Chocolate, Ciprofloxacin, and Lipitor [atorvastatin]  Review of Systems   Review of Systems  Unable to perform ROS: Dementia   Physical Exam Updated Vital Signs BP 120/73    Pulse 79    Temp 98.2  F (36.8 C) (Oral)    Resp (!) 22    Ht 6\' 1"  (1.854 m)    Wt 76.1 kg    SpO2 94%    BMI 22.13 kg/m   Physical Exam Vitals and nursing note reviewed.  Constitutional:      General: He is not in acute distress.    Appearance: He is well-developed. He is ill-appearing.  HENT:     Head: Normocephalic and atraumatic.  Eyes:     Conjunctiva/sclera: Conjunctivae normal.  Cardiovascular:     Rate and Rhythm: Normal rate and regular rhythm.  Pulmonary:     Effort: Pulmonary effort is normal. No respiratory distress.     Breath sounds: No stridor.  Abdominal:     General: There is no distension.  Musculoskeletal:        General: No deformity.     Right lower leg: Edema present.     Left lower leg: Edema present.  Skin:    General: Skin is warm and dry.     Comments: Sacrum: Stage 3   10cm x 8cm with left buttock > right buttock involvement.  Left buttock with 7cm x 6cm x 0.2cm lesion, with 60% red tissue and 40% nonviable tissue.Scant serous drainage Bilateral Heels (medial aspect) Unstageable Pressure Injuries:  black, stable eschar measuring 3cm x 2.2cm. No fluctuance, warmth or induration. No exudate   Neurological:     Mental Status: He is alert.     Comments: Listless, cognitive impairment, substantial atrophy, follows commands inconsistently  Psychiatric:     Comments: Impaired cognition and memory    ED Results / Procedures / Treatments   Labs (all labs ordered are listed, but only abnormal results are displayed) Labs Reviewed - No data to display  EKG None  Radiology DG Chest 1 View  Result Date: 12/12/2021 CLINICAL DATA:  Difficulty breathing.  Questionable aspiration. EXAM: CHEST  1 VIEW COMPARISON:  12/09/2021 and older studies. FINDINGS: Cardiac silhouette is mildly enlarged. No mediastinal or hilar masses. There is opacity at the left lung base similar to the prior study. Minor linear opacities at the right lung base. Remainder of the lungs is clear. Possible small left pleural effusion.  No pneumothorax. Skeletal structures are grossly intact. IMPRESSION: 1. No significant change from the prior chest radiograph. Left lung base opacity consistent with a combination of atelectasis and/or pneumonia and a small effusion. Minor right lung base opacity, likely atelectasis, improved when compared to the chest radiograph dated 12/06/2021. 2. No evidence of pulmonary edema. Electronically Signed   By: Lajean Manes M.D.   On: 12/12/2021 14:47    Procedures Procedures   Medications Ordered in ED Medications  acetaminophen (TYLENOL) tablet 650 mg (has no administration in time range)    Or  acetaminophen (TYLENOL) suppository 650 mg (has no administration in time range)  haloperidol (HALDOL) tablet 0.5 mg (has no administration in time range)    Or  haloperidol (HALDOL) 2 MG/ML solution 0.5 mg (has no administration in time range)    Or  haloperidol lactate (HALDOL) injection 0.5 mg (has no administration in time range)  ondansetron (ZOFRAN-ODT) disintegrating tablet 4 mg ( Oral See Alternative 12/12/21 2108)    Or  ondansetron (ZOFRAN) injection 4 mg (4 mg Intravenous Given  12/12/21 2108)  glycopyrrolate (ROBINUL) tablet 1 mg (has no administration in time range)    Or  glycopyrrolate (ROBINUL) injection 0.2 mg (has no administration in time range)    Or  glycopyrrolate (ROBINUL)  injection 0.2 mg (has no administration in time range)  antiseptic oral rinse (BIOTENE) solution 15 mL (has no administration in time range)  polyvinyl alcohol (LIQUIFILM TEARS) 1.4 % ophthalmic solution 1 drop (has no administration in time range)  bisacodyl (DULCOLAX) suppository 10 mg (has no administration in time range)  morphine CONCENTRATE 10 MG/0.5ML oral solution 5 mg (has no administration in time range)    Or  morphine CONCENTRATE 10 MG/0.5ML oral solution 5 mg (has no administration in time range)  glycopyrrolate (ROBINUL) injection 0.4 mg (0.4 mg Intravenous Given 12/12/21 2111)    ED Course  I have reviewed the triage vital signs and the nursing notes.  Pertinent labs & imaging results that were available during my care of the patient were reviewed by me and considered in my medical decision making (see chart for details).  Update: I discussed his case with his wife at length.  Also discussed his presentation with our hospice team.  Patient has had suctioning, remains calm, in no distress, resting.  Today's physical exam is consistent without a multiple prior evaluations, overall consideration of decline, appropriate discussion with hospice/palliative care, no indication for admission with no new oxygen requirement, no increased work of breathing with suctioning is being provided, no tachycardia, no fever, unremarkable x-ray.  Plans for hospice placement discussed, family somewhat hesitant, but agreeable, seemingly due to prior experiences.  Final Clinical Impression(s) / ED Diagnoses Final diagnoses:  Weakness  Congestion of respiratory tract     Carmin Muskrat, MD 12/12/21 2243

## 2021-12-12 NOTE — Consult Note (Addendum)
Consultation Note Date: 12/12/2021   Patient Name: [name] [name]  DOB: 1934-12-31  MRN: [id number]  Age / Sex: 86 y.o., male  PCP: Laurey Morale, MD Referring Physician: Carmin Muskrat, MD  Reason for Consultation: Hospice Evaluation  HPI/Patient Profile: 86 y.o. male  with past medical history of dementia, seizures, BPH admitted on 12/12/2021 with shortness of breath and gurgling and wife called EMS from facility. Recent admission for recurrent aspiration pneumonia with ongoing aspiration. I last followed 12/10/21 with plan for hospice to follow at [facility] to assist with comfort when he next declined and no plans for rehospitalization but it seems that referral to hospice was completed but hospice services had not yet been officially initiated at [facility] Family had not wanted hospice facility with goal of continuing tube feeding (which will not be done at hospice facility).   Clinical Assessment and Goals of Care: I spoke with wife, Mikle Bosworth. Mikle Bosworth and I discussed her husband's condition and recurrent aspiration. We discussed that he has failed discharge x 2 with recurrent distress within 24-48 hours. I further explained that this is not a problem we can fix. Mikle Bosworth reports that the nursing facility did not know how to care for him or give him relief so she had him brought back to the hospital. Unfortunately hospice services had not been initiated yet as we had planned in our discussions. I discussed with Mikle Bosworth about Mr. Antorio risk of aspiration and that the tube feeding is actually hurting him and his breathing and if we are to keep him comfortable this will be more difficult with the tube feeding. I strongly recommended that we hold tube feeding at this time and focus on managing symptoms to ensure Mr. Blain is comfortable recognizing his time is limited. Mikle Bosworth understands although  continues to struggle with the idea of holding tube feeding. She does want him comfortable. I encouraged her to call her children to come and visit with him. She reports that her son is already en-route to [place] and about 3 hours away.   I discussed with Mikle Bosworth about his condition and recommended hospice facility. Mikle Bosworth knows about [facility] and she shares that she has had many people die at [facility]. She tearfully shares that if Derril is at end of life she would rather that he die in the hospital versus [facility]. She reluctantly agrees that if he cannot stay at the hospital that she may consider [facility]. I explained that this decision is not up to me but I can pass on her request. I explained that I can put in place measures to better provide her husband relief of his symptoms and provide comfort.   Update: Received call back from [place] who needs to go home before it gets dark. I further discussed with her [facility] or even considering a different hospice facility. She tells me that the hospice facilities would all do the same thing and she just doesn't think that he is ready for this yet. However, she  also does not believe they can care for him at [facility] and does agree [facility] would be better than staying in ED. She continues to reluctantly agree to [facility].   Emotional support provided.   Primary Decision Maker NEXT OF KIN wife Alvira Philips    SUMMARY OF RECOMMENDATIONS   - DNR  - Wife agrees to comfort care  Code Status/Advance Care Planning: DNR   Symptom Management:  Comfort medications added.   Palliative Prophylaxis:  Aspiration, Delirium Protocol, Frequent Pain Assessment, Oral Care, Palliative Wound Care, and Turn Reposition  Additional Recommendations (Limitations, Scope, Preferences): Full Comfort Care  Psycho-social/Spiritual:  Desire for further Chaplaincy support:yes Additional Recommendations: Education on Hospice and  Grief/Bereavement Support  Prognosis:  < 2 weeks likely with recurrent aspiration.   Discharge Planning: Recommend hospice facility but family are reluctant.       Primary Diagnoses: Present on Admission: **None**   I have reviewed the medical record, interviewed the patient and family, and examined the patient. The following aspects are pertinent.  Past Medical History:  Diagnosis Date   Allergy    Dementia [facility])    sees Dr. Patrcia Dolly    Diverticulosis of colon (without mention of hemorrhage) 03-01-2004, 04-04-2011   Colonoscopy   ED (erectile dysfunction)    Glaucoma    sees Dr. Arvil Chaco    Hyperglycemia    Hyperlipidemia    Hypertension    Internal hemorrhoids 03-15-1999   Flex Sig    Irritable bowel    Social History   Socioeconomic History   Marital status: Married    Spouse name: Not on file   Number of children: 2   Years of education: 11   Highest education level: Not on file  Occupational History   Occupation: retired    Associate Professor: RETIRED  Tobacco Use   Smoking status: Former    Types: Cigarettes    Quit date: 03/20/1969    Years since quitting: 52.7   Smokeless tobacco: Never  Vaping Use   Vaping Use: Never used  Substance and Sexual Activity   Alcohol use: Yes    Alcohol/week: 0.0 standard drinks    Comment: occ   Drug use: No   Sexual activity: Not on file  Other Topics Concern   Not on file  Social History Narrative   Right handed   Drinks caffeine   One story home   Social Determinants of Health   Financial Resource Strain: Not on file  Food Insecurity: Not on file  Transportation Needs: Not on file  Physical Activity: Not on file  Stress: Not on file  Social Connections: Not on file   Family History  Problem Relation Age of Onset   Hypertension Mother    Lung cancer Father    Colitis Daughter    Scheduled Meds: Continuous Infusions: PRN Meds:.acetaminophen **OR** acetaminophen, antiseptic oral rinse, bisacodyl,  glycopyrrolate **OR** glycopyrrolate **OR** glycopyrrolate, haloperidol **OR** haloperidol **OR** haloperidol lactate, morphine CONCENTRATE **OR** morphine CONCENTRATE, ondansetron **OR** ondansetron (ZOFRAN) IV, polyvinyl alcohol Allergies  Allergen Reactions   Chocolate     Itching if he eats too much chocolate    Ciprofloxacin     Achilles tendon pain    Lipitor [Atorvastatin]     Muscle aches    Review of Systems  Physical Exam  Vital Signs: BP 138/84 (BP Location: Left Arm)    Pulse 78    Temp 98.2 F (36.8 C) (Oral)    Resp 17    Ht  6\' 1"  (1.854 m)    Wt 76.1 kg    SpO2 96%    BMI 22.13 kg/m  Pain Scale: 0-10   Pain Score: Asleep   SpO2: SpO2: 96 % O2 Device:SpO2: 96 % O2 Flow Rate: .   IO: Intake/output summary: No intake or output data in the 24 hours ending 12/12/21 1536  LBM:   Baseline Weight: Weight: 76.1 kg Most recent weight: Weight: 76.1 kg     Palliative Assessment/Data:     Time In: 1500 Time Out: 1550 Time Total: 50 min Greater than 50%  of this time was spent counseling and coordinating care related to the above assessment and plan.  Signed by: 12/14/21, NP Palliative Medicine Team Pager # [phone] (M-F 8a-5p) Team Phone # [phone] (Nights/Weekends)

## 2021-12-13 ENCOUNTER — Emergency Department (HOSPITAL_COMMUNITY): Payer: Medicare Other

## 2021-12-13 DIAGNOSIS — J690 Pneumonitis due to inhalation of food and vomit: Secondary | ICD-10-CM

## 2021-12-13 DIAGNOSIS — Z9189 Other specified personal risk factors, not elsewhere classified: Secondary | ICD-10-CM | POA: Diagnosis not present

## 2021-12-13 DIAGNOSIS — R627 Adult failure to thrive: Secondary | ICD-10-CM | POA: Diagnosis not present

## 2021-12-13 DIAGNOSIS — Z515 Encounter for palliative care: Secondary | ICD-10-CM | POA: Diagnosis not present

## 2021-12-13 DIAGNOSIS — Z7189 Other specified counseling: Secondary | ICD-10-CM | POA: Diagnosis not present

## 2021-12-13 DIAGNOSIS — A419 Sepsis, unspecified organism: Secondary | ICD-10-CM | POA: Diagnosis not present

## 2021-12-13 DIAGNOSIS — N179 Acute kidney failure, unspecified: Secondary | ICD-10-CM | POA: Diagnosis not present

## 2021-12-13 LAB — BASIC METABOLIC PANEL
Anion gap: 8 (ref 5–15)
BUN: 28 mg/dL — ABNORMAL HIGH (ref 8–23)
CO2: 24 mmol/L (ref 22–32)
Calcium: 8.3 mg/dL — ABNORMAL LOW (ref 8.9–10.3)
Chloride: 104 mmol/L (ref 98–111)
Creatinine, Ser: 0.94 mg/dL (ref 0.61–1.24)
GFR, Estimated: >60 mL/min (ref >60)
Glucose, Bld: 129 mg/dL — ABNORMAL HIGH (ref 70–99)
Potassium: 4.2 mmol/L (ref 3.5–5.1)
Sodium: 136 mmol/L (ref 135–145)

## 2021-12-13 LAB — CBC WITH DIFFERENTIAL/PLATELET
Abs Immature Granulocytes: 0.08 10*3/uL — ABNORMAL HIGH (ref 0.00–0.07)
Basophils Absolute: 0.1 10*3/uL (ref 0.0–0.1)
Basophils Relative: 1 %
Eosinophils Absolute: 0 10*3/uL (ref 0.0–0.5)
Eosinophils Relative: 0 %
HCT: 36.6 % — ABNORMAL LOW (ref 39.0–52.0)
Hemoglobin: 11.3 g/dL — ABNORMAL LOW (ref 13.0–17.0)
Immature Granulocytes: 1 %
Lymphocytes Relative: 6 %
Lymphs Abs: 0.6 10*3/uL — ABNORMAL LOW (ref 0.7–4.0)
MCH: 26.6 pg (ref 26.0–34.0)
MCHC: 30.9 g/dL (ref 30.0–36.0)
MCV: 86.1 fL (ref 80.0–100.0)
Monocytes Absolute: 2.1 10*3/uL — ABNORMAL HIGH (ref 0.1–1.0)
Monocytes Relative: 21 %
Neutro Abs: 6.9 10*3/uL (ref 1.7–7.7)
Neutrophils Relative %: 71 %
Platelets: 537 10*3/uL — ABNORMAL HIGH (ref 150–400)
RBC: 4.25 MIL/uL (ref 4.22–5.81)
RDW: 14.9 % (ref 11.5–15.5)
WBC: 9.8 10*3/uL (ref 4.0–10.5)
nRBC: 0 % (ref 0.0–0.2)

## 2021-12-13 MED ORDER — MEMANTINE HCL 5 MG PO TABS
10.0000 mg | ORAL_TABLET | Freq: Every day | ORAL | Status: DC
  Administered 2021-12-13: 22:00:00 10 mg
  Filled 2021-12-13: qty 2

## 2021-12-13 MED ORDER — DORZOLAMIDE HCL 2 % OP SOLN
1.0000 [drp] | Freq: Three times a day (TID) | OPHTHALMIC | Status: DC
  Administered 2021-12-13 – 2021-12-14 (×3): 1 [drp] via OPHTHALMIC
  Filled 2021-12-13: qty 10

## 2021-12-13 MED ORDER — VALPROIC ACID 250 MG/5ML PO SOLN
500.0000 mg | Freq: Every day | ORAL | Status: DC
  Administered 2021-12-13: 22:00:00 500 mg
  Filled 2021-12-13 (×2): qty 10

## 2021-12-13 MED ORDER — ADULT MULTIVITAMIN LIQUID CH
5.0000 mL | Freq: Every day | ORAL | Status: DC
  Administered 2021-12-13 – 2021-12-14 (×2): 5 mL
  Filled 2021-12-13 (×2): qty 15

## 2021-12-13 MED ORDER — ASCORBIC ACID 500 MG PO TABS
250.0000 mg | ORAL_TABLET | Freq: Every day | ORAL | Status: DC
  Administered 2021-12-14: 09:00:00 250 mg
  Filled 2021-12-13: qty 1

## 2021-12-13 MED ORDER — BRINZOLAMIDE 1 % OP SUSP
1.0000 [drp] | Freq: Three times a day (TID) | OPHTHALMIC | Status: DC
  Administered 2021-12-13 – 2021-12-14 (×3): 1 [drp] via OPHTHALMIC
  Filled 2021-12-13: qty 10

## 2021-12-13 MED ORDER — FREE WATER
150.0000 mL | Freq: Four times a day (QID) | Status: DC
Start: 2021-12-13 — End: 2021-12-15
  Administered 2021-12-13 – 2021-12-14 (×4): 150 mL

## 2021-12-13 MED ORDER — AMOXICILLIN-POT CLAVULANATE 875-125 MG PO TABS
1.0000 | ORAL_TABLET | Freq: Two times a day (BID) | ORAL | Status: DC
  Administered 2021-12-13 – 2021-12-14 (×2): 1
  Filled 2021-12-13 (×2): qty 1

## 2021-12-13 MED ORDER — VALPROIC ACID 250 MG/5ML PO SOLN: 1000.0000 mg | Freq: Every day | ORAL | Status: DC

## 2021-12-13 MED ORDER — MELATONIN 5 MG PO TABS
10.0000 mg | ORAL_TABLET | Freq: Every day | ORAL | Status: DC
  Administered 2021-12-13: 22:00:00 10 mg
  Filled 2021-12-13: qty 2

## 2021-12-13 MED ORDER — NETARSUDIL DIMESYLATE 0.02 % OP SOLN
1.0000 [drp] | Freq: Every day | OPHTHALMIC | Status: DC
Start: 2021-12-13 — End: 2021-12-15

## 2021-12-13 MED ORDER — LATANOPROST 0.005 % OP SOLN
2.0000 [drp] | Freq: Every day | OPHTHALMIC | Status: DC
  Administered 2021-12-13: 22:00:00 2 [drp] via OPHTHALMIC
  Filled 2021-12-13: qty 2.5

## 2021-12-13 MED ORDER — PROSOURCE TF PO LIQD
45.0000 mL | Freq: Every day | ORAL | Status: DC
  Administered 2021-12-14: 09:00:00 45 mL
  Filled 2021-12-13: qty 45

## 2021-12-13 MED ORDER — JEVITY 1.2 CAL PO LIQD
1000.0000 mL | ORAL | Status: DC
Start: 2021-12-13 — End: 2021-12-15
  Administered 2021-12-14: 02:00:00 1000 mL
  Filled 2021-12-13 (×4): qty 1000

## 2021-12-13 MED ORDER — ZINC OXIDE 20 % EX OINT
TOPICAL_OINTMENT | Freq: Two times a day (BID) | CUTANEOUS | Status: DC
  Filled 2021-12-13: qty 28.35

## 2021-12-13 MED ORDER — GLUCOSAMINE 500 MG PO CAPS: 500.0000 mg | ORAL_CAPSULE | Freq: Every day | ORAL | Status: DC

## 2021-12-13 MED ORDER — ALBUTEROL SULFATE (2.5 MG/3ML) 0.083% IN NEBU: 2.5000 mg | INHALATION_SOLUTION | RESPIRATORY_TRACT | Status: DC | PRN

## 2021-12-13 MED ORDER — LEVETIRACETAM 100 MG/ML PO SOLN
500.0000 mg | Freq: Two times a day (BID) | ORAL | Status: DC
Start: 2021-12-13 — End: 2021-12-15
  Administered 2021-12-13: 22:00:00 500 mg
  Filled 2021-12-13 (×3): qty 5

## 2021-12-13 MED ORDER — SACCHAROMYCES BOULARDII 250 MG PO CAPS
250.0000 mg | ORAL_CAPSULE | Freq: Two times a day (BID) | ORAL | Status: DC
  Administered 2021-12-13 – 2021-12-14 (×2): 250 mg
  Filled 2021-12-13 (×3): qty 1

## 2021-12-13 MED ORDER — GUAIFENESIN 100 MG/5ML PO LIQD: 15.0000 mL | ORAL | Status: DC | PRN

## 2021-12-13 MED ORDER — VALPROIC ACID 250 MG/5ML PO SOLN
1000.0000 mg | Freq: Every day | ORAL | Status: DC
  Administered 2021-12-14: 09:00:00 1000 mg
  Filled 2021-12-13: qty 20

## 2021-12-13 NOTE — Progress Notes (Signed)
TOC CSW started [place] with [employer].  Chihiro Frey Tarpley-Carter, MSW, LCSW-A Pronouns:  She/Her/Hers [place] Transitions of Care Clinical Social Worker Direct Number:  [phone] [email or web address]

## 2021-12-13 NOTE — Progress Notes (Signed)
TOC CSW contacted [name]/pts wife [phone].  CSW informed Alvira Philips on choices of [address], [address], or home.  Alvira Philips puts pts son/Jerrod on the phone.  Jerrod is wanting to speak to MD before making a decision.  CSW will notify EDP.  Braeley Buskey Tarpley-Carter, MSW, LCSW-A Pronouns:  She/Her/Hers [place] Transitions of Care Clinical Social Worker Direct Number:  [phone] [email or web address]

## 2021-12-13 NOTE — Progress Notes (Signed)
TOC CSW spoke with [employer].  Delorise Shiner stated pt could return tomorrow (12/13/2021) under the conditions as follows:  -Feeding Pump Settings -Orders -[employer] Auth obtained  Leibish Mcgregor Tarpley-Carter, MSW, LCSW-A Pronouns:  She/Her/Hers [place] Transitions of Care Clinical Social Worker Direct Number:  [phone] [email or web address]

## 2021-12-13 NOTE — Progress Notes (Signed)
TOC CSW attempted to contact Grace/[place] [phone].  CSW left HIPPA compliant message with my contact information.   Akiah Bauch Tarpley-Carter, MSW, LCSW-A Pronouns:  She/Her/Hers [place] Transitions of Care Clinical Social Worker Direct Number:  [phone] [email or web address]

## 2021-12-13 NOTE — Progress Notes (Signed)
CSW recived call from [place] with [facility] , she stated pt has bed at [facility]. RN and MD made aware.  Valentina Shaggy.Sonni Barse, MSW, LCSWA [facility]   Transitions of Care Clinical Social Worker I Direct Dial: [phone]   Fax: [phone] [email or web address]

## 2021-12-13 NOTE — Progress Notes (Signed)
PHARMACIST - PHYSICIAN ORDER COMMUNICATION  CONCERNING: P&T Medication Policy on Herbal Medications  DESCRIPTION:  This patients order for:  glucosamine  has been noted.  This product(s) is classified as an herbal or natural product. Due to a lack of definitive safety studies or FDA approval, nonstandard manufacturing practices, plus the potential risk of unknown drug-drug interactions while on inpatient medications, the Pharmacy and Therapeutics Committee does not permit the use of herbal or natural products of this type within [facility].   ACTION TAKEN: The pharmacy department is unable to verify this order at this time and your patient has been informed of this safety policy. Please reevaluate patients clinical condition at discharge and address if the herbal or natural product(s) should be resumed at that time.   Otho Bellows PharmD [facility] [phone] 12/13/2021, 9:00 PM

## 2021-12-13 NOTE — ED Provider Notes (Addendum)
4:10 PM -I received a call from Raquita, from social work, who has had some discussions with family members..  They are not all on the same page, regarding this patient's current status and best way to handle his care needs.  At this time there is no one with the patient who is resting comfortably in a hallway bed.  He is unable to give any history.  He currently has a feeding tube in his abdomen which is not being utilized.  4 :45 PM-prolonged discussion with the patient's son who drove here from [place].  Briefly, the patient was seen by palliative care services yesterday with discussions about end-of-life care to include continued management at [facility], with feeding tube versus transfer to [facility] without feeding tube for end-of-life care.  Apparently yesterday, the patient's wife reluctantly agreed to transfer to [facility].  Today, she is uncomfortable with that.  Her son is also uncomfortable with that because he does not feel that that is the best thing for his father at this time.  5:05 PM-patient's wife is concerned about swelling and pain of the patient's left arm.  Currently there is moderate swelling of the entire left arm to the fingers as well.  He is diffusely tender, in the entire arm and fingers.  Skin color is normal.  5:15 PM-I discussed the situation with the on-call hospitalist who knows the case.  She will see the patient and do a consult to help assist with patient's family's understanding and acceptance of his current status.  She does not believe that the patient should receive feeding tube.  6:20 PM-Dr. Margo Aye saw the patient and gave the following recommendations: Received a page on 12/13/21 to see the patient in the ED.  Unchanged from prior with minimal interactions.  Asked his wife and son at bedside what their goals of care are.  Could not get an answer.  They expressed their discontent that the hospital did not provide any suctioning tools for SNF at the time of  discharge.   *Would resume the patient's discharge orders along with oral suction at bedside as needed.   Recommend palliative and social worker involvement to assist with goals of care and decision on disposition.  6:30 PM-consulted TOC to carry out recommendations from Dr. Margo Aye, listed above.   6:45 PM-I discussed case with TOC who will admit the patient's family's goals of care.  It seems like the best plan at this time is for him to return to [facility] nursing home, where he can receive skilled nursing care to include suctioning, which will potentially help him be more comfortable.  I am hopeful that palliative care can evaluate the patient and discuss things with the family members including the son who is arrival today seem to change the dynamics of the situation.    Mancel Bale, MD 12/13/21 1848  8:52 PM-he developed fever at 2018 p.m.  Cause not clear.  Prior work-up evaluated.  We will add additional evaluation, seeking a source for fever.    Mancel Bale, MD 12/13/21 2057  11:28 PM-vital signs relatively stable.  Fever was treated with rectal Tylenol.  Screening labs ordered.  Chest x-ray small left pleural effusion.  No evidence for infiltrate or edema.  Current plan-palliative care consult in the morning.  Potentially discharge to [facility] nursing home for ongoing management including available suction for periods when he chokes on secretions.  His tube feeding was reordered pending consultation with palliative care.  Mancel Bale, MD 12/13/21 [phone]

## 2021-12-13 NOTE — Consult Note (Addendum)
Medical Consultation   [name] [name]  [id number]  DOB: 13-May-1935  DOA: 12/12/2021  PCP: Laurey Morale, MD   Outpatient Specialists: Hospice   Requesting physician: Dr. Eulis Foster  Reason for consultation: Unclear about family goals of care   History of Present Illness: [name] [name] is an 86 y.o. male with medical history significant of advanced dementia, seizure disorder, BPH who presented to [facility] ED from SNF with dyspnea and hypoxia.  He was just discharged from the hospital for recurrent aspiration PNA. He was treated and returned to his skilled nursing facility.  He was increasingly confused and was having increased need for oral suctioning.  He was found to be hypoxic at the facility. EMS was activated.  Work-up revealed recurrent aspiration pneumonia with right lower lobe infiltrates on chest x-ray and procalcitonin greater than 14.  He was started on empiric IV antibiotics, IV vancomycin and cefepime.  Later switched to IV Unasyn with improvement of his symptomatology.   Hospital course complicated by lethargy.  Palliative care team consulted, ongoing goals of care discussion with family.  Family made decision to discharge to SNF with hospice care.    Per family after his return to SNF he started aspirating again and at the SNF they did not know what to do and they did not have the tools to suction his secretions.  Received a page on 12/13/21 to see the patient in the ED.  Unchanged from prior with minimal interactions.  Asked his wife and son at bedside what their goals of care are.  Could not get an answer.  They expressed their discontent that the hospital did not provide any suctioning tools for SNF at the time of discharge.  *Would resume the patient's discharge orders along with oral suction at bedside as needed.  Recommend palliative and social worker involvement to assist with goals of care and decision on disposition.        Review of Systems:   ROS As per HPI otherwise 10 point review of systems negative.  Patient is unable to provide a history de to advance dementia.  Past Medical History: Past Medical History:  Diagnosis Date   Allergy    Dementia [facility])    sees Dr. Ellouise Newer    Diverticulosis of colon (without mention of hemorrhage) 03-01-2004, 04-04-2011   Colonoscopy   ED (erectile dysfunction)    Glaucoma    sees Dr. Crecencio Mc    Hyperglycemia    Hyperlipidemia    Hypertension    Internal hemorrhoids 03-15-1999   Flex Sig    Irritable bowel     Past Surgical History: Past Surgical History:  Procedure Laterality Date   COLONOSCOPY  04-04-11   per Dr. Sharlett Iles, clear, no repeats needed   EYE SURGERY     bilateral cataract extraction per Dr Argentina Donovan HERNIA REPAIR     IR GASTROSTOMY TUBE MOD SED  11/26/2021   KNEE ARTHROSCOPY     right knee   NASAL SINUS SURGERY       Allergies:   Allergies  Allergen Reactions   Chocolate     Itching if he eats too much chocolate    Ciprofloxacin     Achilles tendon pain    Lipitor [Atorvastatin]     Muscle aches      Social History:  reports that he quit smoking about 52 years ago. He has never  used smokeless tobacco. He reports current alcohol use. He reports that he does not use drugs.   Family History: Family History  Problem Relation Age of Onset   Hypertension Mother    Lung cancer Father    Colitis Daughter     Physical Exam: Vitals:   12/13/21 0325 12/13/21 0439 12/13/21 0631 12/13/21 1312  BP: (!) 142/87 134/81 132/89 128/77  Pulse: 82 79 84 90  Resp: (!) 21 16 20 20   Temp:      TempSrc:      SpO2: 96% 96% 95% 94%  Weight:      Height:       General: 86 y.o. year-old male frail-appearing in no acute distress.  Minimally interactive Cardiovascular: Regular rate and rhythm with no rubs or gallops.  No thyromegaly or JVD noted.   Respiratory: Mild rales right upper lobe, poor inspiratory effort. Abdomen: Soft nontender  nondistended with normal bowel sounds present.  PEG tube in place. Musculoskeletal: Dependent edema upper and lower extremities bilaterally. Skin: Pressure wounds, POA Psychiatry: Minimally interactive   Data reviewed:  I have personally reviewed following labs and imaging studies Labs:  CBC: Recent Labs  Lab 12/08/21 0606 12/09/21 0717  WBC 6.7 5.0  NEUTROABS 4.4  --   HGB 9.9* 10.2*  HCT 32.3* 33.0*  MCV 88.7 88.5  PLT 363 [id number]    Basic Metabolic Panel: Recent Labs  Lab 12/07/21 0406 12/08/21 0606 12/09/21 0717  NA  --  137 140  K  --  4.4 4.6  CL  --  104 104  CO2  --  26 30  GLUCOSE  --  121* 121*  BUN  --  20 19  CREATININE 0.75 0.55* 0.39*  CALCIUM  --  8.3* 8.4*  MG  --  2.1  --   PHOS  --  2.5  --    GFR Estimated Creatinine Clearance: 74 mL/min (A) (by C-G formula based on SCr of 0.39 mg/dL (L)). Liver Function Tests: Recent Labs  Lab 12/08/21 0606  AST 25  ALT 16  ALKPHOS 56  BILITOT <0.1*  PROT 5.2*  ALBUMIN 2.0*   No results for input(s): LIPASE, AMYLASE in the last 168 hours. No results for input(s): AMMONIA in the last 168 hours. Coagulation profile Recent Labs  Lab 12/07/21 0406  INR 1.2    Cardiac Enzymes: No results for input(s): CKTOTAL, CKMB, CKMBINDEX, TROPONINI in the last 168 hours. BNP: Invalid input(s): POCBNP CBG: No results for input(s): GLUCAP in the last 168 hours. D-Dimer No results for input(s): DDIMER in the last 72 hours. Hgb A1c No results for input(s): HGBA1C in the last 72 hours. Lipid Profile No results for input(s): CHOL, HDL, LDLCALC, TRIG, CHOLHDL, LDLDIRECT in the last 72 hours. Thyroid function studies No results for input(s): TSH, T4TOTAL, T3FREE, THYROIDAB in the last 72 hours.  Invalid input(s): FREET3 Anemia work up No results for input(s): VITAMINB12, FOLATE, FERRITIN, TIBC, IRON, RETICCTPCT in the last 72 hours. Urinalysis    Component Value Date/Time   COLORURINE YELLOW 12/06/2021 0929    APPEARANCEUR HAZY (A) 12/06/2021 0929   LABSPEC 1.025 12/06/2021 0929   PHURINE 6.0 12/06/2021 0929   GLUCOSEU NEGATIVE 12/06/2021 0929   GLUCOSEU NEGATIVE 02/06/2020 1032   HGBUR NEGATIVE 12/06/2021 0929   HGBUR negative 09/16/2010 0839   BILIRUBINUR NEGATIVE 12/06/2021 0929   BILIRUBINUR neg 02/22/2019 0958   KETONESUR 5 (A) 12/06/2021 0929   PROTEINUR NEGATIVE 12/06/2021 0929   UROBILINOGEN 0.2 02/06/2020 1032  NITRITE NEGATIVE 12/06/2021 0929   LEUKOCYTESUR SMALL (A) 12/06/2021 0929     Microbiology Recent Results (from the past 240 hour(s))  Culture, blood (Routine X 2) w Reflex to ID Panel     Status: None   Collection Time: 12/06/21  9:29 AM   Specimen: BLOOD  Result Value Ref Range Status   Specimen Description   Final    BLOOD RIGHT ANTECUBITAL Performed at [facility], [address], [place], [place] [address]    Special Requests   Final    BOTTLES DRAWN AEROBIC AND ANAEROBIC Blood Culture adequate volume Performed at [facility], [address] [place], [place] [address]    Culture   Final    NO GROWTH 5 DAYS Performed at [facility], [address], [place], [place] [address]    Report Status 12/11/2021 FINAL  Final  Resp Panel by RT-PCR (Flu A&B, Covid) Nasopharyngeal Swab     Status: None   Collection Time: 12/06/21  9:29 AM   Specimen: Nasopharyngeal Swab; Nasopharyngeal(NP) swabs in vial transport medium  Result Value Ref Range Status   SARS Coronavirus 2 by RT PCR NEGATIVE NEGATIVE Final    Comment: (NOTE) SARS-CoV-2 target nucleic acids are NOT DETECTED.  The SARS-CoV-2 RNA is generally detectable in upper respiratory specimens during the acute phase of infection. The lowest concentration of SARS-CoV-2 viral copies this assay can detect is 138 copies/mL. A negative result does not preclude SARS-Cov-2 infection and should not be used as the sole basis for treatment or other patient management  decisions. A negative result may occur with  improper specimen collection/handling, submission of specimen other than nasopharyngeal swab, presence of viral mutation(s) within the areas targeted by this assay, and inadequate number of viral copies(<138 copies/mL). A negative result must be combined with clinical observations, patient history, and epidemiological information. The expected result is Negative.  Fact Sheet for Patients:  BloggerCourse.com  Fact Sheet for Healthcare Providers:  SeriousBroker.it  This test is no t yet approved or cleared by the [place] FDA and  has been authorized for detection and/or diagnosis of SARS-CoV-2 by FDA under an Emergency Use Authorization (EUA). This EUA will remain  in effect (meaning this test can be used) for the duration of the COVID-19 declaration under Section 564(b)(1) of the Act, 21 U.S.C.section 360bbb-3(b)(1), unless the authorization is terminated  or revoked sooner.       Influenza A by PCR NEGATIVE NEGATIVE Final   Influenza B by PCR NEGATIVE NEGATIVE Final    Comment: (NOTE) The Xpert Xpress SARS-CoV-2/FLU/RSV plus assay is intended as an aid in the diagnosis of influenza from Nasopharyngeal swab specimens and should not be used as a sole basis for treatment. Nasal washings and aspirates are unacceptable for Xpert Xpress SARS-CoV-2/FLU/RSV testing.  Fact Sheet for Patients: BloggerCourse.com  Fact Sheet for Healthcare Providers: SeriousBroker.it  This test is not yet approved or cleared by the [place] FDA and has been authorized for detection and/or diagnosis of SARS-CoV-2 by FDA under an Emergency Use Authorization (EUA). This EUA will remain in effect (meaning this test can be used) for the duration of the COVID-19 declaration under Section 564(b)(1) of the Act, 21 U.S.C. section 360bbb-3(b)(1), unless the  authorization is terminated or revoked.  Performed at [facility], [address], [place], [place] [address]   Culture, blood (Routine X 2) w Reflex to ID Panel     Status: None   Collection  Time: 12/06/21  4:32 PM   Specimen: BLOOD  Result Value Ref Range Status   Specimen Description   Final    BLOOD LEFT ANTECUBITAL Performed at [place] [address] [place], [place] [address]    Special Requests   Final    BOTTLES DRAWN AEROBIC ONLY Blood Culture adequate volume Performed at [place] [address], [place], [place] [address]    Culture   Final    NO GROWTH 5 DAYS Performed at [facility], [place] [address] [place], [place] [address]    Report Status 12/11/2021 FINAL  Final       Inpatient Medications:   Scheduled Meds:  glycopyrrolate  0.4 mg Intravenous QID   Continuous Infusions:   Radiological Exams on Admission: DG Chest 1 View  Result Date: 12/12/2021 CLINICAL DATA:  Difficulty breathing.  Questionable aspiration. EXAM: CHEST  1 VIEW COMPARISON:  12/09/2021 and older studies. FINDINGS: Cardiac silhouette is mildly enlarged. No mediastinal or hilar masses. There is opacity at the left lung base similar to the prior study. Minor linear opacities at the right lung base. Remainder of the lungs is clear. Possible small left pleural effusion.  No pneumothorax. Skeletal structures are grossly intact. IMPRESSION: 1. No significant change from the prior chest radiograph. Left lung base opacity consistent with a combination of atelectasis and/or pneumonia and a small effusion. Minor right lung base opacity, likely atelectasis, improved when compared to the chest radiograph dated 12/06/2021. 2. No evidence of pulmonary edema. Electronically Signed   By: Lajean Manes M.D.   On: 12/12/2021 14:47    Impression/Recommendations Active Problems:   * No active hospital problems. *  Recent multifocal  pneumonia with concern for recurrent aspiration pneumonia, POA History of dysphagia with PEG tube in place. Presented with dyspnea and hypoxia, personally reviewed chest x-ray done on admission which shows right lower lobe infiltrates with concern for aspiration. Repeat chest x-ray done on 12/09/2021 showed some improvement of right lower lobe infiltrates. Procalcitonin greater than 14 on admission, repeat 2.0 on 12/10/2021.   Received 3 days of Unasyn Unasyn switched to Augmentin on 12/10/21 x 7 days. Florastor 250 mg twice daily x 20 days. High risk for recurrent aspiration. Continue aspiration precautions. Resume discharge orders. Oral suctioning as needed at bedside. Keep head of bed elevated greater than 30 degrees during feedings  Seizure disorder Resume home AEDs via tube Seizure precautions  Recommend palliative care team and social worker involvement to assist with goals of care and decision on disposition.  Thank you for this consultation.  Our [facility] hospitalist team will follow the patient with you.   Time Spent: 45 minutes discussing with family, his wife and his son at bedside.  Kayleen Memos M.D. [facility] 12/13/2021, 6:07 PM

## 2021-12-13 NOTE — Progress Notes (Addendum)
[facility] ED [facility] Liaison Note  Patient has been offered a room at [facility] Family accepted bed offer and consents are scheduled to be completed at 2 pm. RN, TOC, family and MD aware of above updates.   MSW to schedule transport once consents have been completed.   Please send signed DNR form with patient and RN call report to [phone].   Addendum  Family refused to complete consent forms. Family would like to speak with hospital team. [facility] made aware.  Odette Fraction, MSW [facility] Liaison [phone]

## 2021-12-13 NOTE — ED Notes (Addendum)
Pt intermittently moaning out in pain.  PRN morphine given for pain relief.

## 2021-12-14 DIAGNOSIS — R627 Adult failure to thrive: Secondary | ICD-10-CM | POA: Diagnosis not present

## 2021-12-14 DIAGNOSIS — Z7189 Other specified counseling: Secondary | ICD-10-CM | POA: Diagnosis not present

## 2021-12-14 DIAGNOSIS — Z515 Encounter for palliative care: Secondary | ICD-10-CM | POA: Diagnosis not present

## 2021-12-14 DIAGNOSIS — Z9189 Other specified personal risk factors, not elsewhere classified: Secondary | ICD-10-CM

## 2021-12-14 LAB — RESP PANEL BY RT-PCR (FLU A&B, COVID) ARPGX2
Influenza A by PCR: NEGATIVE
Influenza B by PCR: NEGATIVE
SARS Coronavirus 2 by RT PCR: NEGATIVE

## 2021-12-14 MED ORDER — FREE WATER: 150.0000 mL | Freq: Four times a day (QID) | 0 refills | Status: AC

## 2021-12-14 MED ORDER — LEVETIRACETAM 100 MG/ML PO SOLN
500.0000 mg | Freq: Two times a day (BID) | ORAL | 0 refills | Status: AC
Start: 2021-12-14 — End: ?

## 2021-12-14 MED ORDER — DORZOLAMIDE HCL 2 % OP SOLN: 1.0000 [drp] | Freq: Three times a day (TID) | OPHTHALMIC | 0 refills | Status: AC

## 2021-12-14 MED ORDER — PROSOURCE TF PO LIQD: 45.0000 mL | Freq: Every day | ORAL | 0 refills | Status: DC

## 2021-12-14 MED ORDER — SACCHAROMYCES BOULARDII 250 MG PO CAPS: 250.0000 mg | ORAL_CAPSULE | Freq: Two times a day (BID) | ORAL | 0 refills | Status: AC

## 2021-12-14 MED ORDER — GLYCOPYRROLATE 1 MG PO TABS
2.0000 mg | ORAL_TABLET | Freq: Three times a day (TID) | ORAL | Status: DC
Start: 2021-12-14 — End: 2021-12-15
  Administered 2021-12-14: 16:00:00 2 mg
  Filled 2021-12-14 (×2): qty 2

## 2021-12-14 MED ORDER — MELATONIN 5 MG PO TABS
10.0000 mg | ORAL_TABLET | Freq: Every day | ORAL | 0 refills | Status: AC
Start: 2021-12-14 — End: ?

## 2021-12-14 MED ORDER — MEMANTINE HCL 10 MG PO TABS
10.0000 mg | ORAL_TABLET | Freq: Every day | ORAL | 0 refills | Status: AC
Start: 2021-12-14 — End: ?

## 2021-12-14 MED ORDER — ALBUTEROL SULFATE (2.5 MG/3ML) 0.083% IN NEBU: 2.5000 mg | INHALATION_SOLUTION | RESPIRATORY_TRACT | 12 refills | Status: AC | PRN

## 2021-12-14 MED ORDER — OMEGA-3 FATTY ACIDS 1000 MG PO CAPS: 1.0000 g | ORAL_CAPSULE | Freq: Every day | ORAL | 0 refills | Status: AC

## 2021-12-14 MED ORDER — LATANOPROST 0.005 % OP SOLN: 2.0000 [drp] | Freq: Every day | OPHTHALMIC | 0 refills | Status: AC

## 2021-12-14 MED ORDER — AMOXICILLIN-POT CLAVULANATE 875-125 MG PO TABS: 1.0000 | ORAL_TABLET | Freq: Two times a day (BID) | ORAL | 0 refills | Status: DC

## 2021-12-14 MED ORDER — VALPROIC ACID 250 MG/5ML PO SOLN: 500.0000 mg | ORAL | 0 refills | Status: AC

## 2021-12-14 MED ORDER — ACETAMINOPHEN 500 MG PO TABS: 500.0000 mg | ORAL_TABLET | Freq: Three times a day (TID) | ORAL | 0 refills | Status: AC | PRN

## 2021-12-14 MED ORDER — GUAIFENESIN 100 MG/5ML PO LIQD: 15.0000 mL | ORAL | 0 refills | Status: AC | PRN

## 2021-12-14 MED ORDER — GLUCOSAMINE 500 MG PO CAPS: 500.0000 mg | ORAL_CAPSULE | Freq: Every day | ORAL | 0 refills | Status: AC

## 2021-12-14 MED ORDER — VITAMIN C 250 MG PO TABS: 250.0000 mg | ORAL_TABLET | Freq: Every day | ORAL | 0 refills | Status: AC

## 2021-12-14 MED ORDER — GLYCOPYRROLATE 1 MG PO TABS: 1.0000 mg | ORAL_TABLET | Freq: Four times a day (QID) | ORAL | 0 refills | Status: DC | PRN

## 2021-12-14 MED ORDER — GLYCOPYRROLATE 1 MG PO TABS
1.0000 mg | ORAL_TABLET | ORAL | Status: DC | PRN
  Filled 2021-12-14: qty 1

## 2021-12-14 MED ORDER — ZINC OXIDE 20 % EX OINT: 1.0000 "application " | TOPICAL_OINTMENT | Freq: Two times a day (BID) | CUTANEOUS | 0 refills | Status: AC

## 2021-12-14 MED ORDER — GLYCOPYRROLATE 1 MG PO TABS: 2.0000 mg | ORAL_TABLET | Freq: Three times a day (TID) | ORAL | 0 refills | Status: DC

## 2021-12-14 MED ORDER — ADULT MULTIVITAMIN LIQUID CH: 5.0000 mL | Freq: Every day | ORAL | 0 refills | Status: AC

## 2021-12-14 MED ORDER — SENNA 8.6 MG PO TABS: 1.0000 | ORAL_TABLET | Freq: Every day | ORAL | Status: DC

## 2021-12-14 MED ORDER — GLYCOPYRROLATE 1 MG PO TABS
2.0000 mg | ORAL_TABLET | Freq: Three times a day (TID) | ORAL | Status: DC
Start: 2021-12-14 — End: 2021-12-14
  Filled 2021-12-14: qty 2

## 2021-12-14 NOTE — Progress Notes (Signed)
CSW spoke with pt's wife to informed pt has been accepted back to [facility] despite SNF auth denial. She agreed with d/c plan for hospice services to follow. Pt can return back to [facility] upon d/c .  Valentina Shaggy.Lulla Linville, MSW, LCSWA [facility]   Transitions of Care Clinical Social Worker I Direct Dial: [phone]   Fax: [phone] [email or web address]

## 2021-12-14 NOTE — ED Notes (Signed)
[facility] contacted and pt is still on list to be transported

## 2021-12-14 NOTE — ED Provider Notes (Signed)
Patient has been boarding in the emergency department.  He is ready for discharge to [place] per the Child psychotherapist. Will print Rx and discharge   Pricilla Loveless, MD 12/14/21 [phone]

## 2021-12-14 NOTE — Progress Notes (Signed)
PROGRESS NOTE    [name] [name]  EYC:[id number] DOB: 09-Jul-1935 DOA: 12/12/2021 PCP: [name], MD    Brief Narrative:  This 86 y.o. male with PMH significant of advanced dementia, seizure disorder, BPH who presented to [facility] ED from SNF with dyspnea and hypoxia.  He was just discharged from the hospital for recurrent aspiration PNA. He was treated and discharged to his skilled nursing facility.  He was increasingly confused and was having increased need for oral suctioning.  He was found to be hypoxic at the facility. EMS was activated.  Work-up revealed recurrent aspiration pneumonia with right lower lobe infiltrates on chest x-ray and procalcitonin greater than 14.  He was started on empiric IV antibiotics, IV vancomycin and cefepime.  Later switched to IV Unasyn with improvement of his symptomatology. Hospital course complicated by lethargy.  Palliative care team consulted, ongoing goals of care discussion with family.  Family made decision to discharge to SNF with hospice care.   Per family after his return to SNF he started aspirating again and at the SNF they did not know what to do and they did not have the tools to suction his secretions.   TRH was consulted for hypoxia and medical management.  Patient seems back to her baseline mental status.   *Would resume the patient's discharge orders along with oral suction at bedside as needed.   Recommend palliative and social worker involvement to assist with goals of care and decision on disposition. Assessment & Plan:   Active Problems:   * No active hospital problems. *  Recent multifocal pneumonia with concern for recurrent aspiration pneumonia, POA History of dysphagia with PEG tube in place. Presented with dyspnea and hypoxia, reviewed chest x-ray done on admission which shows right lower lobe infiltrates with concern for aspiration. Repeat chest x-ray done on 12/09/2021 showed some improvement of right lower lobe  infiltrates. Procalcitonin greater than 14 on admission, repeat 2.0 on 12/10/2021.   Received 3 days of Unasyn. Unasyn switched to Augmentin on 12/10/21 x 7 days. Florastor 250 mg twice daily x 20 days. High risk for recurrent aspiration. Continue aspiration precautions. Resume discharge orders. Oral suctioning as needed at bedside. Keep head of bed elevated greater than 30 degrees during feedings   Seizure disorder Resume home AEDs via tube. Seizure precautions   Patient is being discharged back to skilled nursing facility.  Subjective: Patient was seen and examined at bedside.  He seems back to his baseline mental status.  Objective: Vitals:   12/14/21 1130 12/14/21 1200 12/14/21 1230 12/14/21 1300  BP: (!) 157/85 (!) 151/82 (!) 177/87 139/76  Pulse: 78 79 82 79  Resp: (!) 24 18 (!) 27 (!) 23  Temp:      TempSrc:      SpO2: 96% 96% 97% 95%  Weight:      Height:       No intake or output data in the 24 hours ending 12/14/21 1538 Filed Weights   12/12/21 1301  Weight: 76.1 kg    Examination:  General exam: Appears comfortable, chronically ill looking. Respiratory system: Decreased breath sounds, poor inspiratory effort. Cardiovascular system: S1 & S2 heard, regular rate and rhythm.  No murmur  Gastrointestinal system: Abdomen is nondistended, soft and nontender. No organomegaly or masses felt. BS+, PEG tube+ Central nervous system: Patient is minimally interactive. Extremities: Edema+, no cyanosis, no clubbing Skin: No rashes, lesions or ulcers Psychiatry: Minimally interactive.    Data Reviewed: I have personally reviewed following  labs and imaging studies  CBC: Recent Labs  Lab 12/08/21 0606 12/09/21 0717 12/13/21 2157  WBC 6.7 5.0 9.8  NEUTROABS 4.4  --  6.9  HGB 9.9* 10.2* 11.3*  HCT 32.3* 33.0* 36.6*  MCV 88.7 88.5 86.1  PLT 363 340 537*   Basic Metabolic Panel: Recent Labs  Lab 12/08/21 0606 12/09/21 0717 12/13/21 2157  NA 137 140 136  K  4.4 4.6 4.2  CL 104 104 104  CO2 26 30 24   GLUCOSE 121* 121* 129*  BUN 20 19 28*  CREATININE 0.55* 0.39* 0.94  CALCIUM 8.3* 8.4* 8.3*  MG 2.1  --   --   PHOS 2.5  --   --    GFR: Estimated Creatinine Clearance: 63 mL/min (by C-G formula based on SCr of 0.94 mg/dL). Liver Function Tests: Recent Labs  Lab 12/08/21 0606  AST 25  ALT 16  ALKPHOS 56  BILITOT <0.1*  PROT 5.2*  ALBUMIN 2.0*   No results for input(s): LIPASE, AMYLASE in the last 168 hours. No results for input(s): AMMONIA in the last 168 hours. Coagulation Profile: No results for input(s): INR, PROTIME in the last 168 hours. Cardiac Enzymes: No results for input(s): CKTOTAL, CKMB, CKMBINDEX, TROPONINI in the last 168 hours. BNP (last 3 results) No results for input(s): PROBNP in the last 8760 hours. HbA1C: No results for input(s): HGBA1C in the last 72 hours. CBG: No results for input(s): GLUCAP in the last 168 hours. Lipid Profile: No results for input(s): CHOL, HDL, LDLCALC, TRIG, CHOLHDL, LDLDIRECT in the last 72 hours. Thyroid Function Tests: No results for input(s): TSH, T4TOTAL, FREET4, T3FREE, THYROIDAB in the last 72 hours. Anemia Panel: No results for input(s): VITAMINB12, FOLATE, FERRITIN, TIBC, IRON, RETICCTPCT in the last 72 hours. Sepsis Labs: Recent Labs  Lab 12/08/21 0606 12/09/21 0717 12/10/21 0624  PROCALCITON 6.52 3.67 2.00    Recent Results (from the past 240 hour(s))  Culture, blood (Routine X 2) w Reflex to ID Panel     Status: None   Collection Time: 12/06/21  9:29 AM   Specimen: BLOOD  Result Value Ref Range Status   Specimen Description   Final    BLOOD RIGHT ANTECUBITAL Performed at [facility], [address] [place], [place]    Special Requests   Final    BOTTLES DRAWN AEROBIC AND ANAEROBIC Blood Culture adequate volume Performed at [facility], [address] [place], [place]    Culture   Final    NO  GROWTH 5 DAYS Performed at [facility], [address] [place], [place]    Report Status 12/11/2021 FINAL  Final  Resp Panel by RT-PCR (Flu A&B, Covid) Nasopharyngeal Swab     Status: None   Collection Time: 12/06/21  9:29 AM   Specimen: Nasopharyngeal Swab; Nasopharyngeal(NP) swabs in vial transport medium  Result Value Ref Range Status   SARS Coronavirus 2 by RT PCR NEGATIVE NEGATIVE Final    Comment: (NOTE) SARS-CoV-2 target nucleic acids are NOT DETECTED.  The SARS-CoV-2 RNA is generally detectable in upper respiratory specimens during the acute phase of infection. The lowest concentration of SARS-CoV-2 viral copies this assay can detect is 138 copies/mL. A negative result does not preclude SARS-Cov-2 infection and should not be used as the sole basis for treatment or other patient management decisions. A negative result may occur with  improper specimen collection/handling, submission of specimen other than nasopharyngeal swab, presence of  viral mutation(s) within the areas targeted by this assay, and inadequate number of viral copies(<138 copies/mL). A negative result must be combined with clinical observations, patient history, and epidemiological information. The expected result is Negative.  Fact Sheet for Patients:  BloggerCourse.com  Fact Sheet for Healthcare Providers:  SeriousBroker.it  This test is no t yet approved or cleared by the [place] FDA and  has been authorized for detection and/or diagnosis of SARS-CoV-2 by FDA under an Emergency Use Authorization (EUA). This EUA will remain  in effect (meaning this test can be used) for the duration of the COVID-19 declaration under Section 564(b)(1) of the Act, 21 U.S.C.section 360bbb-3(b)(1), unless the authorization is terminated  or revoked sooner.       Influenza A by PCR NEGATIVE NEGATIVE Final   Influenza B by PCR NEGATIVE NEGATIVE Final     Comment: (NOTE) The Xpert Xpress SARS-CoV-2/FLU/RSV plus assay is intended as an aid in the diagnosis of influenza from Nasopharyngeal swab specimens and should not be used as a sole basis for treatment. Nasal washings and aspirates are unacceptable for Xpert Xpress SARS-CoV-2/FLU/RSV testing.  Fact Sheet for Patients: BloggerCourse.com  Fact Sheet for Healthcare Providers: SeriousBroker.it  This test is not yet approved or cleared by the [place] FDA and has been authorized for detection and/or diagnosis of SARS-CoV-2 by FDA under an Emergency Use Authorization (EUA). This EUA will remain in effect (meaning this test can be used) for the duration of the COVID-19 declaration under Section 564(b)(1) of the Act, 21 U.S.C. section 360bbb-3(b)(1), unless the authorization is terminated or revoked.  Performed at [facility], [address], [place], [place] [address]   Culture, blood (Routine X 2) w Reflex to ID Panel     Status: None   Collection Time: 12/06/21  4:32 PM   Specimen: BLOOD  Result Value Ref Range Status   Specimen Description   Final    BLOOD LEFT ANTECUBITAL Performed at [facility], [address], [place], [place] [address]    Special Requests   Final    BOTTLES DRAWN AEROBIC ONLY Blood Culture adequate volume Performed at [facility], [address], [place], [place] [address]    Culture   Final    NO GROWTH 5 DAYS Performed at [facility], [address], [place], [place] [address]    Report Status 12/11/2021 FINAL  Final  Resp Panel by RT-PCR (Flu A&B, Covid) Nasopharyngeal Swab     Status: None   Collection Time: 12/14/21  7:13 AM   Specimen: Nasopharyngeal Swab; Nasopharyngeal(NP) swabs in vial transport medium  Result Value Ref Range Status   SARS Coronavirus 2 by RT PCR NEGATIVE NEGATIVE Final    Comment: (NOTE) SARS-CoV-2  target nucleic acids are NOT DETECTED.  The SARS-CoV-2 RNA is generally detectable in upper respiratory specimens during the acute phase of infection. The lowest concentration of SARS-CoV-2 viral copies this assay can detect is 138 copies/mL. A negative result does not preclude SARS-Cov-2 infection and should not be used as the sole basis for treatment or other patient management decisions. A negative result may occur with  improper specimen collection/handling, submission of specimen other than nasopharyngeal swab, presence of viral mutation(s) within the areas targeted by this assay, and inadequate number of viral copies(<138 copies/mL). A negative result must be combined with clinical observations, patient history, and epidemiological information. The expected result is Negative.  Fact Sheet for Patients:  BloggerCourse.com  Fact  Sheet for Healthcare Providers:  SeriousBroker.it  This test is no t yet approved or cleared by the [place] FDA and  has been authorized for detection and/or diagnosis of SARS-CoV-2 by FDA under an Emergency Use Authorization (EUA). This EUA will remain  in effect (meaning this test can be used) for the duration of the COVID-19 declaration under Section 564(b)(1) of the Act, 21 U.S.C.section 360bbb-3(b)(1), unless the authorization is terminated  or revoked sooner.       Influenza A by PCR NEGATIVE NEGATIVE Final   Influenza B by PCR NEGATIVE NEGATIVE Final    Comment: (NOTE) The Xpert Xpress SARS-CoV-2/FLU/RSV plus assay is intended as an aid in the diagnosis of influenza from Nasopharyngeal swab specimens and should not be used as a sole basis for treatment. Nasal washings and aspirates are unacceptable for Xpert Xpress SARS-CoV-2/FLU/RSV testing.  Fact Sheet for Patients: BloggerCourse.com  Fact Sheet for Healthcare  Providers: SeriousBroker.it  This test is not yet approved or cleared by the [place] FDA and has been authorized for detection and/or diagnosis of SARS-CoV-2 by FDA under an Emergency Use Authorization (EUA). This EUA will remain in effect (meaning this test can be used) for the duration of the COVID-19 declaration under Section 564(b)(1) of the Act, 21 U.S.C. section 360bbb-3(b)(1), unless the authorization is terminated or revoked.  Performed at [facility], [address] [place], [place] [address]     Radiology Studies: [facility] Chest Port 1 View  Result Date: 12/13/2021 CLINICAL DATA:  Fever. EXAM: PORTABLE CHEST 1 VIEW COMPARISON:  Chest x-ray 12/12/2021 FINDINGS: There is a small left pleural effusion. There is some strandy opacities in both lung bases. There is no evidence for pneumothorax. Aorta is ectatic in the heart is normal in size. There are healed right third, fourth, fifth and sixth rib fractures, unchanged. IMPRESSION: 1. Small left pleural effusion with minimal bibasilar atelectasis. Electronically Signed   By: Darliss Cheney M.D.   On: 12/13/2021 21:15    Scheduled Meds:  amoxicillin-clavulanate  1 tablet Per Tube Q12H   vitamin C  250 mg Per Tube Daily   brinzolamide  1 drop Both Eyes TID   dorzolamide  1 drop Both Eyes TID   feeding supplement (PROSource TF)  45 mL Per Tube Daily   free water  150 mL Per Tube Q6H   glycopyrrolate  2 mg Per Tube TID   latanoprost  2 drop Both Eyes QHS   levETIRAcetam  500 mg Per Tube BID   melatonin  10 mg Per Tube QHS   memantine  10 mg Per Tube QHS   multivitamin  5 mL Per Tube Daily   Netarsudil Dimesylate  1 drop Both Eyes QHS   saccharomyces boulardii  250 mg Per Tube BID   senna  1 tablet Oral Daily   valproic acid  1,000 mg Per Tube Daily   valproic acid  500 mg Per Tube QHS   zinc oxide   Topical BID   Continuous Infusions:  feeding supplement (JEVITY 1.2 CAL) 1,000 mL  (12/14/21 0132)     LOS: 0 days    Time spent: 25 mins    Marshell Dilauro, MD [facility]   If 7PM-7AM, please contact night-coverage

## 2021-12-14 NOTE — ED Notes (Signed)
Palliative care at bedside.

## 2021-12-14 NOTE — ED Notes (Signed)
[facility] contacted for transport back to  [facility].  

## 2021-12-14 NOTE — ED Notes (Signed)
Pt resting in bed with eyes closed. Will open eyes slightly when being spoken to. Tube feedings continue.

## 2021-12-14 NOTE — Progress Notes (Addendum)
TOC CSW contact [employer] to check on Auth status, they stated they did not see prior auth request. CSW completed prior auth request. CSW to fax clinical information to [facility].   Adden 12:09pm CSW spoke with Amy from [facility] , she stated pt will not receive Auth as he is under hospice services. She stated pt's family will need to choose between SNF placement and Hospice services , pt can not have both.    Valentina Shaggy.Secret Kristensen, MSW, LCSWA [facility]   Transitions of Care Clinical Social Worker I Direct Dial: [phone]   Fax: [phone] [email or web address]

## 2021-12-14 NOTE — Progress Notes (Addendum)
Palliative:  HPI:  86 y.o. male  with past medical history of dementia, seizures, BPH admitted on 12/12/2021 with shortness of breath and gurgling and wife called EMS from facility. Recent admission for recurrent aspiration pneumonia with ongoing aspiration. I last followed 12/10/21 with plan for hospice to follow at [facility] to assist with comfort when he next declined and no plans for rehospitalization but it seems that referral to hospice was completed but hospice services had not yet been officially initiated at [facility]. Family had not wanted hospice facility with goal of continuing tube feeding (which will not be done at hospice facility).   I met today with Mr. Dung but no family at bedside. He is resting comfortably. RN providing medications via PEG and tube feeding provided.   I called both wife Mikle Bosworth and then I called son Kevan Ny. I spent time discussing with them the risks vs benefits of continuing tube feeding and ongoing risk of aspiration even without providing any tube feeding as he is at risk of aspiration on his own saliva. I explained that all our measures to feed, increase head of bed, and medications to dry secretions are all with hopes of preventing aspiration but knowing aspiration is not at all always able to be prevented and knowing he will always be at risk. Explained to son concern for ongoing aspiration risk as well as anatomy of bronchioles with most common aspiration going into right lung. They do not feel that he is ready for hospice placement and continue to desire continued tube feedings. I explained that I will call and speak with hospice liaison to try and make sure that hospice will be started sooner than later at the facility to try and help with his symptom management and so he does not have to continue to come back and forth to the hospital. I did explain that the unfortunate timing of his discharge on a Friday late and hospice not started was poor timing but  sometimes unavoidable. I also explained that the hospital makes recommendations (son has copy of discharge summary) but it is up to the providers at the nursing facility to follow and put our recommendations into action. Both wife and son express understanding of poor prognosis and that they do not want him to suffer. DNR maintained. Desire for hospice and no rehospitalization maintained. Son even mentions that ideally his father will pass away gently in his sleep one night.   All questions/concerns addressed to the best of my ability. Emotional support provided. Discussed with [place] and [employer] as well as CSW.   Exam: Thin, frail. Resting comfortably. Complains of being cold - RN getting more blankets. Overall confused, does not follow commands. Inconsistent verbal responses. No distress. Breathing regular, unlabored. Tube feeding going. Abd flat.   Plan: - DNR - Maintain current interventions - Hospice at facility - Goal to avoid rehospitalization and treat symptoms at nursing facility with guidance of hospice - Continue scheduled and PRN Robinul for secretion management  45 min  Vinie Sill, NP Palliative Medicine Team Pager [phone] (Please see [email or web address] for schedule) Team Phone [phone]    Greater than 50%  of this time was spent counseling and coordinating care related to the above assessment and plan

## 2021-12-14 NOTE — ED Notes (Signed)
Dc instructions and scripts reviewed with RN Hakem at [facility].

## 2021-12-16 ENCOUNTER — Inpatient Hospital Stay (HOSPITAL_COMMUNITY)
Admission: EM | Admit: 2021-12-16 | Discharge: 2021-12-20 | DRG: 871 | Disposition: A | Discharge status: Skilled Nursing Facility | Payer: Medicare Other | Attending: Family Medicine | Admitting: Family Medicine

## 2021-12-16 ENCOUNTER — Emergency Department (HOSPITAL_COMMUNITY): Payer: Medicare Other

## 2021-12-16 ENCOUNTER — Inpatient Hospital Stay (HOSPITAL_COMMUNITY): Payer: Medicare Other

## 2021-12-16 DIAGNOSIS — Z20822 Contact with and (suspected) exposure to covid-19: Secondary | ICD-10-CM | POA: Diagnosis present

## 2021-12-16 DIAGNOSIS — A419 Sepsis, unspecified organism: Secondary | ICD-10-CM | POA: Diagnosis present

## 2021-12-16 DIAGNOSIS — F0390 Unspecified dementia without behavioral disturbance: Secondary | ICD-10-CM | POA: Diagnosis present

## 2021-12-16 DIAGNOSIS — Z87891 Personal history of nicotine dependence: Secondary | ICD-10-CM

## 2021-12-16 DIAGNOSIS — Z8249 Family history of ischemic heart disease and other diseases of the circulatory system: Secondary | ICD-10-CM | POA: Diagnosis not present

## 2021-12-16 DIAGNOSIS — R6520 Severe sepsis without septic shock: Secondary | ICD-10-CM | POA: Diagnosis not present

## 2021-12-16 DIAGNOSIS — Z931 Gastrostomy status: Secondary | ICD-10-CM

## 2021-12-16 DIAGNOSIS — N401 Enlarged prostate with lower urinary tract symptoms: Secondary | ICD-10-CM | POA: Diagnosis present

## 2021-12-16 DIAGNOSIS — N138 Other obstructive and reflux uropathy: Secondary | ICD-10-CM | POA: Diagnosis present

## 2021-12-16 DIAGNOSIS — J690 Pneumonitis due to inhalation of food and vomit: Secondary | ICD-10-CM | POA: Diagnosis present

## 2021-12-16 DIAGNOSIS — Z881 Allergy status to other antibiotic agents status: Secondary | ICD-10-CM | POA: Diagnosis not present

## 2021-12-16 DIAGNOSIS — R0902 Hypoxemia: Secondary | ICD-10-CM | POA: Diagnosis present

## 2021-12-16 DIAGNOSIS — N179 Acute kidney failure, unspecified: Secondary | ICD-10-CM | POA: Diagnosis present

## 2021-12-16 DIAGNOSIS — Z515 Encounter for palliative care: Secondary | ICD-10-CM | POA: Diagnosis not present

## 2021-12-16 DIAGNOSIS — Z66 Do not resuscitate: Secondary | ICD-10-CM | POA: Diagnosis present

## 2021-12-16 DIAGNOSIS — D649 Anemia, unspecified: Secondary | ICD-10-CM | POA: Diagnosis present

## 2021-12-16 DIAGNOSIS — Z801 Family history of malignant neoplasm of trachea, bronchus and lung: Secondary | ICD-10-CM | POA: Diagnosis not present

## 2021-12-16 DIAGNOSIS — G9341 Metabolic encephalopathy: Secondary | ICD-10-CM | POA: Diagnosis present

## 2021-12-16 DIAGNOSIS — G40909 Epilepsy, unspecified, not intractable, without status epilepticus: Secondary | ICD-10-CM | POA: Diagnosis present

## 2021-12-16 DIAGNOSIS — Z888 Allergy status to other drugs, medicaments and biological substances status: Secondary | ICD-10-CM

## 2021-12-16 DIAGNOSIS — H409 Unspecified glaucoma: Secondary | ICD-10-CM | POA: Diagnosis present

## 2021-12-16 DIAGNOSIS — R531 Weakness: Secondary | ICD-10-CM | POA: Diagnosis not present

## 2021-12-16 DIAGNOSIS — E785 Hyperlipidemia, unspecified: Secondary | ICD-10-CM | POA: Diagnosis present

## 2021-12-16 DIAGNOSIS — L89159 Pressure ulcer of sacral region, unspecified stage: Secondary | ICD-10-CM | POA: Diagnosis present

## 2021-12-16 DIAGNOSIS — Z79899 Other long term (current) drug therapy: Secondary | ICD-10-CM | POA: Diagnosis not present

## 2021-12-16 DIAGNOSIS — R1310 Dysphagia, unspecified: Secondary | ICD-10-CM | POA: Diagnosis present

## 2021-12-16 DIAGNOSIS — Z91018 Allergy to other foods: Secondary | ICD-10-CM

## 2021-12-16 DIAGNOSIS — N400 Enlarged prostate without lower urinary tract symptoms: Secondary | ICD-10-CM | POA: Diagnosis present

## 2021-12-16 DIAGNOSIS — Z7189 Other specified counseling: Secondary | ICD-10-CM | POA: Diagnosis not present

## 2021-12-16 LAB — CBC WITH DIFFERENTIAL/PLATELET
Abs Immature Granulocytes: 0.05 10*3/uL (ref 0.00–0.07)
Basophils Absolute: 0 10*3/uL (ref 0.0–0.1)
Basophils Relative: 0 %
Eosinophils Absolute: 0.1 10*3/uL (ref 0.0–0.5)
Eosinophils Relative: 1 %
HCT: 32.7 % — ABNORMAL LOW (ref 39.0–52.0)
Hemoglobin: 10.4 g/dL — ABNORMAL LOW (ref 13.0–17.0)
Immature Granulocytes: 1 %
Lymphocytes Relative: 6 %
Lymphs Abs: 0.6 10*3/uL — ABNORMAL LOW (ref 0.7–4.0)
MCH: 26.9 pg (ref 26.0–34.0)
MCHC: 31.8 g/dL (ref 30.0–36.0)
MCV: 84.7 fL (ref 80.0–100.0)
Monocytes Absolute: 1.5 10*3/uL — ABNORMAL HIGH (ref 0.1–1.0)
Monocytes Relative: 15 %
Neutro Abs: 7.4 10*3/uL (ref 1.7–7.7)
Neutrophils Relative %: 77 %
Platelets: 409 10*3/uL — ABNORMAL HIGH (ref 150–400)
RBC: 3.86 MIL/uL — ABNORMAL LOW (ref 4.22–5.81)
RDW: 15.3 % (ref 11.5–15.5)
WBC: 9.7 10*3/uL (ref 4.0–10.5)
nRBC: 0 % (ref 0.0–0.2)

## 2021-12-16 LAB — URINALYSIS, ROUTINE W REFLEX MICROSCOPIC
Bacteria, UA: NONE SEEN
Bilirubin Urine: NEGATIVE
Glucose, UA: NEGATIVE mg/dL
Ketones, ur: NEGATIVE mg/dL
Leukocytes,Ua: NEGATIVE
Nitrite: NEGATIVE
Protein, ur: NEGATIVE mg/dL
Specific Gravity, Urine: 1.02 (ref 1.005–1.030)
pH: 6 (ref 5.0–8.0)

## 2021-12-16 LAB — COMPREHENSIVE METABOLIC PANEL
ALT: 33 U/L (ref 0–44)
AST: 25 U/L (ref 15–41)
Albumin: 2.1 g/dL — ABNORMAL LOW (ref 3.5–5.0)
Alkaline Phosphatase: 67 U/L (ref 38–126)
Anion gap: 8 (ref 5–15)
BUN: 74 mg/dL — ABNORMAL HIGH (ref 8–23)
CO2: 25 mmol/L (ref 22–32)
Calcium: 8 mg/dL — ABNORMAL LOW (ref 8.9–10.3)
Chloride: 104 mmol/L (ref 98–111)
Creatinine, Ser: 3.26 mg/dL — ABNORMAL HIGH (ref 0.61–1.24)
GFR, Estimated: 18 mL/min — ABNORMAL LOW (ref >60)
Glucose, Bld: 185 mg/dL — ABNORMAL HIGH (ref 70–99)
Potassium: 4.6 mmol/L (ref 3.5–5.1)
Sodium: 137 mmol/L (ref 135–145)
Total Bilirubin: 0.4 mg/dL (ref 0.3–1.2)
Total Protein: 6.5 g/dL (ref 6.5–8.1)

## 2021-12-16 LAB — RESP PANEL BY RT-PCR (FLU A&B, COVID) ARPGX2
Influenza A by PCR: NEGATIVE
Influenza B by PCR: NEGATIVE
SARS Coronavirus 2 by RT PCR: NEGATIVE

## 2021-12-16 LAB — CBC
HCT: 32.4 % — ABNORMAL LOW (ref 39.0–52.0)
Hemoglobin: 10 g/dL — ABNORMAL LOW (ref 13.0–17.0)
MCH: 26.5 pg (ref 26.0–34.0)
MCHC: 30.9 g/dL (ref 30.0–36.0)
MCV: 85.9 fL (ref 80.0–100.0)
Platelets: 388 10*3/uL (ref 150–400)
RBC: 3.77 MIL/uL — ABNORMAL LOW (ref 4.22–5.81)
RDW: 15 % (ref 11.5–15.5)
WBC: 11 10*3/uL — ABNORMAL HIGH (ref 4.0–10.5)
nRBC: 0 % (ref 0.0–0.2)

## 2021-12-16 LAB — CREATININE, SERUM
Creatinine, Ser: 1.72 mg/dL — ABNORMAL HIGH (ref 0.61–1.24)
GFR, Estimated: 39 mL/min — ABNORMAL LOW (ref >60)

## 2021-12-16 LAB — PROTIME-INR
INR: 1.2 (ref 0.8–1.2)
Prothrombin Time: 15.4 seconds — ABNORMAL HIGH (ref 11.4–15.2)

## 2021-12-16 LAB — LACTIC ACID, PLASMA
Lactic Acid, Venous: 1.3 mmol/L (ref 0.5–1.9)
Lactic Acid, Venous: 1.6 mmol/L (ref 0.5–1.9)

## 2021-12-16 LAB — MRSA NEXT GEN BY PCR, NASAL: MRSA by PCR Next Gen: NOT DETECTED

## 2021-12-16 MED ORDER — SODIUM CHLORIDE 0.9 % IV SOLN
2.0000 g | Freq: Once | INTRAVENOUS | Status: AC
  Administered 2021-12-16: 07:00:00 2 g via INTRAVENOUS
  Filled 2021-12-16: qty 2

## 2021-12-16 MED ORDER — ACETAMINOPHEN 650 MG RE SUPP: 650.0000 mg | Freq: Four times a day (QID) | RECTAL | Status: DC | PRN

## 2021-12-16 MED ORDER — VANCOMYCIN VARIABLE DOSE PER UNSTABLE RENAL FUNCTION (PHARMACIST DOSING): Status: DC

## 2021-12-16 MED ORDER — VALPROIC ACID 250 MG/5ML PO SOLN
1000.0000 mg | Freq: Every day | ORAL | Status: DC
  Administered 2021-12-17 – 2021-12-20 (×4): 1000 mg
  Filled 2021-12-16 (×4): qty 20

## 2021-12-16 MED ORDER — METRONIDAZOLE 500 MG/100ML IV SOLN
500.0000 mg | Freq: Once | INTRAVENOUS | Status: AC
  Administered 2021-12-16: 08:00:00 500 mg via INTRAVENOUS
  Filled 2021-12-16: qty 100

## 2021-12-16 MED ORDER — SODIUM CHLORIDE 0.9 % IV SOLN: INTRAVENOUS | Status: DC

## 2021-12-16 MED ORDER — ALBUTEROL SULFATE (2.5 MG/3ML) 0.083% IN NEBU: 2.5000 mg | INHALATION_SOLUTION | RESPIRATORY_TRACT | Status: DC | PRN

## 2021-12-16 MED ORDER — HEPARIN SODIUM (PORCINE) 5000 UNIT/ML IJ SOLN
5000.0000 [IU] | Freq: Three times a day (TID) | INTRAMUSCULAR | Status: DC
  Administered 2021-12-16 – 2021-12-20 (×13): 5000 [IU] via SUBCUTANEOUS
  Filled 2021-12-16 (×12): qty 1

## 2021-12-16 MED ORDER — CHLORHEXIDINE GLUCONATE CLOTH 2 % EX PADS
6.0000 | MEDICATED_PAD | Freq: Every day | CUTANEOUS | Status: DC
  Administered 2021-12-16 – 2021-12-20 (×5): 6 via TOPICAL

## 2021-12-16 MED ORDER — MEMANTINE HCL 10 MG PO TABS
10.0000 mg | ORAL_TABLET | Freq: Every day | ORAL | Status: DC
  Administered 2021-12-16 – 2021-12-19 (×4): 10 mg
  Filled 2021-12-16 (×4): qty 1

## 2021-12-16 MED ORDER — ACETAMINOPHEN 325 MG PO TABS: 650.0000 mg | ORAL_TABLET | Freq: Four times a day (QID) | ORAL | Status: DC | PRN

## 2021-12-16 MED ORDER — VALPROIC ACID 250 MG/5ML PO SOLN
500.0000 mg | Freq: Every day | ORAL | Status: DC
  Administered 2021-12-16 – 2021-12-19 (×4): 500 mg
  Filled 2021-12-16 (×6): qty 10

## 2021-12-16 MED ORDER — VANCOMYCIN HCL IN DEXTROSE 1-5 GM/200ML-% IV SOLN
1000.0000 mg | Freq: Once | INTRAVENOUS | Status: AC
  Administered 2021-12-16: 08:00:00 1000 mg via INTRAVENOUS
  Filled 2021-12-16: qty 200

## 2021-12-16 MED ORDER — SODIUM CHLORIDE 0.9 % IV SOLN
2.0000 g | INTRAVENOUS | Status: DC
  Administered 2021-12-17: 07:00:00 2 g via INTRAVENOUS
  Filled 2021-12-16 (×2): qty 2

## 2021-12-16 MED ORDER — LEVETIRACETAM 100 MG/ML PO SOLN
500.0000 mg | Freq: Two times a day (BID) | ORAL | Status: DC
  Administered 2021-12-16 – 2021-12-20 (×8): 500 mg
  Filled 2021-12-16 (×10): qty 5

## 2021-12-16 MED ORDER — LACTATED RINGERS IV BOLUS (SEPSIS)
1000.0000 mL | Freq: Once | INTRAVENOUS | Status: AC
  Administered 2021-12-16: 07:00:00 1000 mL via INTRAVENOUS

## 2021-12-16 MED ORDER — VALPROIC ACID 250 MG/5ML PO SOLN: 500.0000 mg | ORAL | Status: DC

## 2021-12-16 NOTE — Progress Notes (Signed)
A consult was received from an ED physician for Vancomycin & Cefepime per pharmacy dosing.  The patient's profile has been reviewed for ht/wt/allergies/indication/available labs.   A one time order has been placed for Cefepime 2gm & Vancomycin 1gm.  Further antibiotics/pharmacy consults should be ordered by admitting physician if indicated.                       Thank you, Junita Push PharmD 12/16/2021  6:26 AM

## 2021-12-16 NOTE — ED Provider Notes (Signed)
Care of the patient assumed at the change of shift. Here for respiratory distress, fever, likely aspiration. Also has AKI awaiting hospitalist consult.  Physical Exam  BP (!) 147/95    Pulse 90    Temp (!) 102 F (38.9 C) (Rectal)    Resp (!) 38    SpO2 100%   Physical Exam  ED Course/Procedures     Procedures  MDM  Patient with >400cc on bladder scan, with AKI, temp foley ordered.   Spoke with Hospitalist, who will evaluate for admission.        Pollyann Savoy, MD 12/16/21 [phone]

## 2021-12-16 NOTE — Sepsis Progress Note (Signed)
Following per sepsis protocol   

## 2021-12-16 NOTE — H&P (Addendum)
History and Physical    [name] [name] [id number] DOB: March 18, 1935 DOA: 12/16/2021  PCP: Laurey Morale, MD  Patient coming from: [facility]  Chief Complaint: fever  HPI: [name] [name] is a 86 y.o. male with medical history significant of dementia, seizures, BPH, recurrent asp pna. History is from chart review as patient is somnolent and has hx of dementia. [facility] staff found the patient rapidly breathing this morning. They checked his temp and it was elevated. They became concerned and called for EMS.   ED Course: He was febrile, tachypneic and hypoxic at admission. He was started on NRB. CXR showed BLL PNA. He has a history of recurrent aspiration pna w/ a recent admission. He was started on broad spec abx. He was found to have an AKI. [facility] was called for admission.    Review of Systems: Unable to obtain d/t mentation  PMHx Past Medical History:  Diagnosis Date   Allergy    Dementia [facility])    sees Dr. Ellouise Newer    Diverticulosis of colon (without mention of hemorrhage) 03-01-2004, 04-04-2011   Colonoscopy   ED (erectile dysfunction)    Glaucoma    sees Dr. Crecencio Mc    Hyperglycemia    Hyperlipidemia    Hypertension    Internal hemorrhoids 03-15-1999   Flex Sig    Irritable bowel     PSHx Past Surgical History:  Procedure Laterality Date   COLONOSCOPY  04-04-11   per Dr. Sharlett Iles, clear, no repeats needed   EYE SURGERY     bilateral cataract extraction per Dr Dolores Lory   INGUINAL HERNIA REPAIR     IR GASTROSTOMY TUBE MOD SED  11/26/2021   KNEE ARTHROSCOPY     right knee   NASAL SINUS SURGERY      SocHx  reports that he quit smoking about 52 years ago. He has never used smokeless tobacco. He reports current alcohol use. He reports that he does not use drugs.  Allergies  Allergen Reactions   Chocolate     Itching if he eats too much chocolate    Ciprofloxacin     Achilles tendon pain    Lipitor [Atorvastatin]     Muscle aches      FamHx Family History  Problem Relation Age of Onset   Hypertension Mother    Lung cancer Father    Colitis Daughter     Prior to Admission medications   Medication Sig Start Date End Date Taking? Authorizing Provider  acetaminophen (TYLENOL) 500 MG tablet Place 1 tablet (500 mg total) into feeding tube every 8 (eight) hours as needed. 12/14/21   Sherwood Gambler, MD  albuterol (PROVENTIL) (2.5 MG/3ML) 0.083% nebulizer solution Take 3 mLs (2.5 mg total) by nebulization every 2 (two) hours as needed for wheezing or shortness of breath. 12/14/21   Sherwood Gambler, MD  amoxicillin-clavulanate (AUGMENTIN) 875-125 MG tablet Place 1 tablet into feeding tube every 12 (twelve) hours for 3 days. 12/14/21 12/17/21  Sherwood Gambler, MD  AZOPT 1 % ophthalmic suspension Place 1 drop into both eyes 3 (three) times daily. 12/18/19   [provider]  dorzolamide (TRUSOPT) 2 % ophthalmic solution Place 1 drop into both eyes 3 (three) times daily. 12/14/21   Sherwood Gambler, MD  fish oil-omega-3 fatty acids 1000 MG capsule Place 1 capsule (1 g total) into feeding tube daily. 12/14/21   Sherwood Gambler, MD  Glucosamine 500 MG CAPS Take 1 capsule (500 mg total) by mouth daily. 12/14/21  Sherwood Gambler, MD  glycopyrrolate (ROBINUL) 1 MG tablet Place 2 tablets (2 mg total) into feeding tube 3 (three) times daily. 12/14/21 01/13/22  Sherwood Gambler, MD  glycopyrrolate (ROBINUL) 1 MG tablet Take 1 tablet (1 mg total) by mouth 4 (four) times daily as needed (excessive secretions). 12/14/21   Sherwood Gambler, MD  guaiFENesin (ROBITUSSIN) 100 MG/5ML liquid Place 15 mLs into feeding tube every 4 (four) hours as needed for cough or to loosen phlegm. 12/14/21   Sherwood Gambler, MD  latanoprost (XALATAN) 0.005 % ophthalmic solution Place 2 drops into both eyes at bedtime. 12/14/21   Sherwood Gambler, MD  levETIRAcetam (KEPPRA) 100 MG/ML solution Place 5 mLs (500 mg total) into feeding tube 2 (two) times daily.  12/14/21   Sherwood Gambler, MD  melatonin 5 MG TABS Place 2 tablets (10 mg total) into feeding tube at bedtime. 12/14/21   Sherwood Gambler, MD  memantine (NAMENDA) 10 MG tablet Place 1 tablet (10 mg total) into feeding tube at bedtime. 12/14/21   Sherwood Gambler, MD  Multiple Vitamin (MULTIVITAMIN) LIQD Place 5 mLs into feeding tube daily. 12/14/21 03/14/22  Sherwood Gambler, MD  Nutritional Supplements (FEEDING SUPPLEMENT, PROSOURCE TF,) liquid Place 45 mLs into feeding tube daily. 12/14/21 01/13/22  Sherwood Gambler, MD  RHOPRESSA 0.02 % SOLN Place 1 drop into both eyes at bedtime. 08/17/19   [provider]  saccharomyces boulardii (FLORASTOR) 250 MG capsule Place 1 capsule (250 mg total) into feeding tube 2 (two) times daily for 20 days. 12/14/21 01/03/22  Sherwood Gambler, MD  triamcinolone ointment (KENALOG) 0.1 % Apply 1 application topically every 8 (eight) hours as needed (irritation).    [provider]  valproic acid (DEPAKENE) 250 MG/5ML solution Place 10-20 mLs (500-1,000 mg total) into feeding tube See admin instructions. 1000 mg daily and 500 mg at bedtime 12/14/21 01/13/22  Sherwood Gambler, MD  vitamin C (ASCORBIC ACID) 250 MG tablet Place 1 tablet (250 mg total) into feeding tube daily. 12/14/21   Sherwood Gambler, MD  Water For Irrigation, Sterile (FREE WATER) SOLN Place 150 mLs into feeding tube every 6 (six) hours. 12/14/21 01/13/22  Sherwood Gambler, MD  zinc oxide 20 % ointment Apply 1 application topically 2 (two) times daily. To buttocks for wound care 12/14/21 01/13/22  Sherwood Gambler, MD    Physical Exam: Vitals:   12/16/21 0544 12/16/21 0630 12/16/21 0700  BP: 132/80 100/67 (!) 147/95  Pulse: 96 87 90  Resp:  (!) 29 (!) 38  Temp: (!) 102 F (38.9 C)    TempSrc: Rectal    SpO2: 100% 98% 100%    General: 86 y.o. ill appearing male resting in bed ENMT: Nares patent w/o discharge, orophaynx clear, dentition poor, ears w/o discharge/lesions/ulcers Neck: Supple,  trachea midline Cardiovascular: RRR, +S1, S2, no m/g/r, equal pulses throughout Respiratory: tachypneic, scattered rhonchi, decreased at bases  GI: BS+, NDNT, no masses noted, no organomegaly noted, PEG inplace MSK: No e/c/c; sacral ulcer Neuro: somnolent, weakly responsive to noxious stim  Labs on Admission: I have personally reviewed following labs and imaging studies  CBC: Recent Labs  Lab 12/13/21 2157 12/16/21 0535  WBC 9.8 9.7  NEUTROABS 6.9 7.4  HGB 11.3* 10.4*  HCT 36.6* 32.7*  MCV 86.1 84.7  PLT 537* [id number]   Basic Metabolic Panel: Recent Labs  Lab 12/13/21 2157 12/16/21 0535  NA 136 137  K 4.2 4.6  CL 104 104  CO2 24 25  GLUCOSE 129* 185*  BUN 28* 74*  CREATININE  0.94 3.26*  CALCIUM 8.3* 8.0*   GFR: Estimated Creatinine Clearance: 18.2 mL/min (A) (by C-G formula based on SCr of 3.26 mg/dL (H)). Liver Function Tests: Recent Labs  Lab 12/16/21 0535  AST 25  ALT 33  ALKPHOS 67  BILITOT 0.4  PROT 6.5  ALBUMIN 2.1*   No results for input(s): LIPASE, AMYLASE in the last 168 hours. No results for input(s): AMMONIA in the last 168 hours. Coagulation Profile: Recent Labs  Lab 12/16/21 0535  INR 1.2   Cardiac Enzymes: No results for input(s): CKTOTAL, CKMB, CKMBINDEX, TROPONINI in the last 168 hours. BNP (last 3 results) No results for input(s): PROBNP in the last 8760 hours. HbA1C: No results for input(s): HGBA1C in the last 72 hours. CBG: No results for input(s): GLUCAP in the last 168 hours. Lipid Profile: No results for input(s): CHOL, HDL, LDLCALC, TRIG, CHOLHDL, LDLDIRECT in the last 72 hours. Thyroid Function Tests: No results for input(s): TSH, T4TOTAL, FREET4, T3FREE, THYROIDAB in the last 72 hours. Anemia Panel: No results for input(s): VITAMINB12, FOLATE, FERRITIN, TIBC, IRON, RETICCTPCT in the last 72 hours. Urine analysis:    Component Value Date/Time   COLORURINE YELLOW 12/06/2021 0929   APPEARANCEUR HAZY (A) 12/06/2021 0929    LABSPEC 1.025 12/06/2021 0929   PHURINE 6.0 12/06/2021 0929   GLUCOSEU NEGATIVE 12/06/2021 0929   GLUCOSEU NEGATIVE 02/06/2020 1032   HGBUR NEGATIVE 12/06/2021 0929   HGBUR negative 09/16/2010 0839   BILIRUBINUR NEGATIVE 12/06/2021 0929   BILIRUBINUR neg 02/22/2019 0958   KETONESUR 5 (A) 12/06/2021 0929   PROTEINUR NEGATIVE 12/06/2021 0929   UROBILINOGEN 0.2 02/06/2020 1032   NITRITE NEGATIVE 12/06/2021 0929   LEUKOCYTESUR SMALL (A) 12/06/2021 0929    Radiological Exams on Admission: DG Chest Port 1 View  Result Date: 12/16/2021 CLINICAL DATA:  Shortness of breath. EXAM: PORTABLE CHEST 1 VIEW COMPARISON:  12/13/2021 FINDINGS: 0605 hours. Bibasilar atelectasis or infiltrate noted with small left pleural effusion. Cardiopericardial silhouette is at upper limits of normal for size. The visualized bony structures of the thorax show no acute abnormality. Telemetry leads overlie the chest. IMPRESSION: Bibasilar atelectasis or infiltrate with small left pleural effusion. Electronically Signed   By: Kennith Center M.D.   On: 12/16/2021 06:13    EKG: Independently reviewed. Sinus, RBBB  Assessment/Plan Recurrent Aspiration PNA SIRS     - admit to inpt, SDU     - continue broad spec abx for now     - nebs PRN     - holding TF for now     - fluids  AKI     - check renal US     - fluids, watch nephrotoxins  Acute on chronic metabolic encephalopathy     - Tx as above     - resume home dementia meds  Dysphagia     - PEG in place; holding TF  Normocytic anemia     - no evidence of bleed     - follow  Seizure     - resume home seizure meds   Sacral decub POA     - WOCN  Goals of care     - family was in the middle of conversations w/ hospice team for possible outpatient hospice; it sounds like it was about to be arranged     - asked about wishes for this visit; right now they want full tx of his PNA; he will remain DNR at this time     - family is  having discussion about making  him CC this visit and holding further treatment, but that decision has not yet been made     - will ask palliative care to see  DVT prophylaxis: heparin Code Status: DNR  Family Communication: w/ wife and son  Consults called: None   Status is: Inpatient  Remains inpatient appropriate because: severity of illness  90 minutes spent in coordination of this admission including family counseling.   Jonnie Finner DO [facility]  If 7PM-7AM, please contact night-coverage [email or web address]  12/16/2021, 7:21 AM

## 2021-12-16 NOTE — ED Provider Notes (Signed)
[facility] DEPT Provider Note   CSN: [id number] Arrival date & time: 12/16/21  0530     History Chief Complaint  Patient presents with   Code Sepsis    [name] [name] is a 86 y.o. male.  Patient is a 86 year old male with a history of dementia, BPH and seizures who presents with possible sepsis.  He currently resides at [place] facility.  Staff found him early this morning with a fever and rapid breathing.  They also noted that he was not responding normally.  Normally he will verbalize with one-word answers per EMS.  On chart review, he was recently admitted for aspiration pneumonia.  History is limited due to his dementia.  Per EMS, he was given Tylenol through his feeding tube prior to their leaving for the hospital.      Past Medical History:  Diagnosis Date   Allergy    Dementia [facility])    sees Dr. Ellouise Newer    Diverticulosis of colon (without mention of hemorrhage) 03-01-2004, 04-04-2011   Colonoscopy   ED (erectile dysfunction)    Glaucoma    sees Dr. Crecencio Mc    Hyperglycemia    Hyperlipidemia    Hypertension    Internal hemorrhoids 03-15-1999   Flex Sig    Irritable bowel     Patient Active Problem List   Diagnosis Date Noted   Malnutrition of moderate degree 12/10/2021   Multifocal pneumonia 99991111   Acute metabolic encephalopathy 99991111   Acute encephalopathy 11/18/2021   Recurrent pneumonia 10/20/2021   Pressure injury of skin 05/11/2021   Pericardial effusion, acute    COVID    Palliative care by specialist    Goals of care, counseling/discussion    General weakness    Syncope 05/07/2021   Seizure ([place]) 03/09/2021   Dementia ([place]) 02/25/2021   BPH with urinary obstruction 12/22/2020   Ankle edema, bilateral 07/13/2020   Hallucinations 02/09/2020   Abdominal pain, acute 08/21/2014   GI bleeding 05/26/2012   Renal mass 05/26/2012   Diverticulosis of colon with hemorrhage 05/26/2012    SHOULDER PAIN, BILATERAL 07/30/2010   HERPES SIMPLEX INFECTION 10/10/2007   TACHYCARDIA, PAROXYSMAL NOS 10/10/2007   Dyslipidemia 07/11/2007   Essential hypertension 07/11/2007   ALLERGIC RHINITIS 07/11/2007    Past Surgical History:  Procedure Laterality Date   COLONOSCOPY  04-04-11   per Dr. Sharlett Iles, clear, no repeats needed   EYE SURGERY     bilateral cataract extraction per Dr Dolores Lory   INGUINAL HERNIA REPAIR     IR GASTROSTOMY TUBE MOD SED  11/26/2021   KNEE ARTHROSCOPY     right knee   NASAL SINUS SURGERY         Family History  Problem Relation Age of Onset   Hypertension Mother    Lung cancer Father    Colitis Daughter     Social History   Tobacco Use   Smoking status: Former    Types: Cigarettes    Quit date: 03/20/1969    Years since quitting: 52.7   Smokeless tobacco: Never  Vaping Use   Vaping Use: Never used  Substance Use Topics   Alcohol use: Yes    Alcohol/week: 0.0 standard drinks    Comment: occ   Drug use: No    Home Medications Prior to Admission medications   Medication Sig Start Date End Date Taking? Authorizing Provider  acetaminophen (TYLENOL) 500 MG tablet Place 1 tablet (500 mg total) into feeding tube every  8 (eight) hours as needed. 12/14/21   Sherwood Gambler, MD  albuterol (PROVENTIL) (2.5 MG/3ML) 0.083% nebulizer solution Take 3 mLs (2.5 mg total) by nebulization every 2 (two) hours as needed for wheezing or shortness of breath. 12/14/21   Sherwood Gambler, MD  amoxicillin-clavulanate (AUGMENTIN) 875-125 MG tablet Place 1 tablet into feeding tube every 12 (twelve) hours for 3 days. 12/14/21 12/17/21  Sherwood Gambler, MD  AZOPT 1 % ophthalmic suspension Place 1 drop into both eyes 3 (three) times daily. 12/18/19   [provider]  dorzolamide (TRUSOPT) 2 % ophthalmic solution Place 1 drop into both eyes 3 (three) times daily. 12/14/21   Sherwood Gambler, MD  fish oil-omega-3 fatty acids 1000 MG capsule Place 1 capsule (1 g total)  into feeding tube daily. 12/14/21   Sherwood Gambler, MD  Glucosamine 500 MG CAPS Take 1 capsule (500 mg total) by mouth daily. 12/14/21   Sherwood Gambler, MD  glycopyrrolate (ROBINUL) 1 MG tablet Place 2 tablets (2 mg total) into feeding tube 3 (three) times daily. 12/14/21 01/13/22  Sherwood Gambler, MD  glycopyrrolate (ROBINUL) 1 MG tablet Take 1 tablet (1 mg total) by mouth 4 (four) times daily as needed (excessive secretions). 12/14/21   Sherwood Gambler, MD  guaiFENesin (ROBITUSSIN) 100 MG/5ML liquid Place 15 mLs into feeding tube every 4 (four) hours as needed for cough or to loosen phlegm. 12/14/21   Sherwood Gambler, MD  latanoprost (XALATAN) 0.005 % ophthalmic solution Place 2 drops into both eyes at bedtime. 12/14/21   Sherwood Gambler, MD  levETIRAcetam (KEPPRA) 100 MG/ML solution Place 5 mLs (500 mg total) into feeding tube 2 (two) times daily. 12/14/21   Sherwood Gambler, MD  melatonin 5 MG TABS Place 2 tablets (10 mg total) into feeding tube at bedtime. 12/14/21   Sherwood Gambler, MD  memantine (NAMENDA) 10 MG tablet Place 1 tablet (10 mg total) into feeding tube at bedtime. 12/14/21   Sherwood Gambler, MD  Multiple Vitamin (MULTIVITAMIN) LIQD Place 5 mLs into feeding tube daily. 12/14/21 03/14/22  Sherwood Gambler, MD  Nutritional Supplements (FEEDING SUPPLEMENT, PROSOURCE TF,) liquid Place 45 mLs into feeding tube daily. 12/14/21 01/13/22  Sherwood Gambler, MD  RHOPRESSA 0.02 % SOLN Place 1 drop into both eyes at bedtime. 08/17/19   [provider]  saccharomyces boulardii (FLORASTOR) 250 MG capsule Place 1 capsule (250 mg total) into feeding tube 2 (two) times daily for 20 days. 12/14/21 01/03/22  Sherwood Gambler, MD  triamcinolone ointment (KENALOG) 0.1 % Apply 1 application topically every 8 (eight) hours as needed (irritation).    [provider]  valproic acid (DEPAKENE) 250 MG/5ML solution Place 10-20 mLs (500-1,000 mg total) into feeding tube See admin instructions. 1000 mg  daily and 500 mg at bedtime 12/14/21 01/13/22  Sherwood Gambler, MD  vitamin C (ASCORBIC ACID) 250 MG tablet Place 1 tablet (250 mg total) into feeding tube daily. 12/14/21   Sherwood Gambler, MD  Water For Irrigation, Sterile (FREE WATER) SOLN Place 150 mLs into feeding tube every 6 (six) hours. 12/14/21 01/13/22  Sherwood Gambler, MD  zinc oxide 20 % ointment Apply 1 application topically 2 (two) times daily. To buttocks for wound care 12/14/21 01/13/22  Sherwood Gambler, MD    Allergies    Chocolate, Ciprofloxacin, and Lipitor [atorvastatin]  Review of Systems   Review of Systems  Unable to perform ROS: Dementia   Physical Exam Updated Vital Signs BP (!) 147/95    Pulse 90    Temp (!) 102 F (  38.9 C) (Rectal)    Resp (!) 38    SpO2 100%   Physical Exam Constitutional:      Appearance: He is well-developed. He is ill-appearing.  HENT:     Head: Normocephalic and atraumatic.  Eyes:     Pupils: Pupils are equal, round, and reactive to light.  Cardiovascular:     Rate and Rhythm: Regular rhythm. Tachycardia present.     Heart sounds: Normal heart sounds.  Pulmonary:     Effort: Pulmonary effort is normal. Tachypnea present. No respiratory distress.     Breath sounds: Normal breath sounds. No wheezing or rales.  Chest:     Chest wall: No tenderness.  Abdominal:     General: Bowel sounds are normal.     Palpations: Abdomen is soft.     Tenderness: There is no abdominal tenderness. There is no guarding or rebound.     Comments: Feeding tube in place without suggestions of infection  Musculoskeletal:        General: Normal range of motion.     Cervical back: Normal range of motion and neck supple.  Lymphadenopathy:     Cervical: No cervical adenopathy.  Skin:    General: Skin is warm and dry.     Findings: No rash.     Comments: There isHe does have a pressure ulcer on his buttocks area with skin breakdown.  No significant drainage or signs of infection.  Neurological:     Mental  Status: He is alert.     Comments: Will open his eyes to painful stimuli.  Is not following commands.    ED Results / Procedures / Treatments   Labs (all labs ordered are listed, but only abnormal results are displayed) Labs Reviewed  COMPREHENSIVE METABOLIC PANEL - Abnormal; Notable for the following components:      Result Value   Glucose, Bld 185 (*)    BUN 74 (*)    Creatinine, Ser 3.26 (*)    Calcium 8.0 (*)    Albumin 2.1 (*)    GFR, Estimated 18 (*)    All other components within normal limits  CBC WITH DIFFERENTIAL/PLATELET - Abnormal; Notable for the following components:   RBC 3.86 (*)    Hemoglobin 10.4 (*)    HCT 32.7 (*)    Platelets 409 (*)    Lymphs Abs 0.6 (*)    Monocytes Absolute 1.5 (*)    All other components within normal limits  PROTIME-INR - Abnormal; Notable for the following components:   Prothrombin Time 15.4 (*)    All other components within normal limits  RESP PANEL BY RT-PCR (FLU A&B, COVID) ARPGX2  CULTURE, BLOOD (ROUTINE X 2)  CULTURE, BLOOD (ROUTINE X 2)  LACTIC ACID, PLASMA  LACTIC ACID, PLASMA  URINALYSIS, ROUTINE W REFLEX MICROSCOPIC    EKG EKG Interpretation  Date/Time:  Thursday December 16 2021 05:54:21 EST Ventricular Rate:  93 PR Interval:  161 QRS Duration: 153 QT Interval:  364 QTC Calculation: 453 R Axis:   -55 Text Interpretation: Sinus rhythm RBBB and LAFB since last tracing no significant change Confirmed by Rolan Bucco ([id number]) on 12/16/2021 5:57:28 AM  Radiology DG Chest Port 1 View  Result Date: 12/16/2021 CLINICAL DATA:  Shortness of breath. EXAM: PORTABLE CHEST 1 VIEW COMPARISON:  12/13/2021 FINDINGS: 0605 hours. Bibasilar atelectasis or infiltrate noted with small left pleural effusion. Cardiopericardial silhouette is at upper limits of normal for size. The visualized bony structures of the thorax show no acute  abnormality. Telemetry leads overlie the chest. IMPRESSION: Bibasilar atelectasis or infiltrate with  small left pleural effusion. Electronically Signed   By: Misty Stanley M.D.   On: 12/16/2021 06:13    Procedures Procedures   Medications Ordered in ED Medications  lactated ringers bolus 1,000 mL (1,000 mLs Intravenous New Bag/Given 12/16/21 0645)  ceFEPIme (MAXIPIME) 2 g in sodium chloride 0.9 % 100 mL IVPB (2 g Intravenous New Bag/Given 12/16/21 0646)  metroNIDAZOLE (FLAGYL) IVPB 500 mg (has no administration in time range)  vancomycin (VANCOCIN) IVPB 1000 mg/200 mL premix (has no administration in time range)    ED Course  I have reviewed the triage vital signs and the nursing notes.  Pertinent labs & imaging results that were available during my care of the patient were reviewed by me and considered in my medical decision making (see chart for details).    MDM Rules/Calculators/A&P                         Patient is a 86 year old male who presents with sepsis.  He is febrile and altered.  On chart review has had some recent admissions for aspiration pneumonia.  Chest x-ray looks concerning for pneumonia.  He was mildly hypoxic on EMS arrival but is oxygenating normally on supplemental oxygen.  He is not hypotensive.  His white count is normal.  His lactate is normal.  He is febrile.  He was started on antibiotics and given bolus of LR.  Urinalysis is still pending.  He does not have significant abdominal pain on exam.  No other wounds that appear to be infected.  Hospitalist consulted for admission.  Awaiting callback.  CRITICAL CARE Performed by: Malvin Johns Total critical care time: 45 minutes Critical care time was exclusive of separately billable procedures and treating other patients. Critical care was necessary to treat or prevent imminent or life-threatening deterioration. Critical care was time spent personally by me on the following activities: development of treatment plan with patient and/or surrogate as well as nursing, discussions with consultants, evaluation of  patient's response to treatment, examination of patient, obtaining history from patient or surrogate, ordering and performing treatments and interventions, ordering and review of laboratory studies, ordering and review of radiographic studies, pulse oximetry and re-evaluation of patient's condition.     Final Clinical Impression(s) / ED Diagnoses Final diagnoses:  Sepsis [facility])    Rx / [place] Orders ED Discharge Orders     None        Malvin Johns, MD 12/16/21 [phone]

## 2021-12-16 NOTE — Consult Note (Addendum)
WOC Nurse Consult Note: Patient receiving care in [facility] ED 20. I was assisted by 2 NTs in cleaning and turning the patient. Reason for Consult: sacral wound Wound type: unstageable sacral wound. And, one dried DTPI to each heel. Pressure Injury POA: Yes Measurement: sacral wound measures 11 cm x 10 cm x unknown depth. Wound bed is pink and dark red/maroon with some black hue to it also.  Both heels have dried DTPIs that are maroon in color. The right heel wound measured 3 cm x 5 cm. The left heel wound measured 6 cm x 4 cm. Wound bed: Drainage (amount, consistency, odor) none Periwound: intact Dressing procedure/placement/frequency: Apply iodine from the swabsticks or swab pads from clean utility to discolored areas on bilateral heels.  Allow to air dry. Then place feet back in the Prevalon Heel Lift boots he has on. For the sacrum, twice daily application of saline moistened gauze and ABD pad. Change also if soiled.  When I arrived to the room the patient had liquid brown stool up to his scapula, half way down his thighs posteriorly, and across his mons pubis and the length of his penis--which had a foley catheter inserted in it.  The NTs and I did a complete cleaning and he immediately began stooling large amounts of liquid brown again.  I found his primary RN and let her know what had happened, and asked her to request the provider order a Flexiseal Fecal Containment system for these on-going, massive volume stooling events. She agreed to do so. Thank you for the consult.  WOC nurse will not follow at this time.  Please re-consult the WOC team if needed.  Helmut Muster, RN, MSN, CWOCN, CNS-BC, pager [phone]

## 2021-12-16 NOTE — Progress Notes (Signed)
WLED  [facility]) Hospital Liaison Note  Mattison Stuckey is a current [facility] patient. Please call with any questions or concerns. We will continue to follow.   Lynder Parents [facility] Liaison [phone]

## 2021-12-16 NOTE — Progress Notes (Signed)
Pharmacy Antibiotic Note  [name] [name] is a 86 y.o. male admitted on 12/16/2021 with SIRS, recurrent aspiration PNA.  Pharmacy has been consulted for vancomycin and cefepime dosing.  SCr significantly elevated above baseline (~0.5) at 3.26  Plan: Vancomycin 1000 mg given in ED  - Will f/u renal function and dose by levels - D/C vancomycin if MRSA PCR negative per MD  Cefepime 2 g iv q 24 hours     Temp (24hrs), Avg:99.5 F (37.5 C), Min:98.3 F (36.8 C), Max:102 F (38.9 C)  Recent Labs  Lab 12/13/21 2157 12/16/21 0535 12/16/21 0811  WBC 9.8 9.7  --   CREATININE 0.94 3.26*  --   LATICACIDVEN  --  1.3 1.6    Estimated Creatinine Clearance: 18.2 mL/min (A) (by C-G formula based on SCr of 3.26 mg/dL (H)).    Allergies  Allergen Reactions   Chocolate     Itching if he eats too much chocolate    Ciprofloxacin     Achilles tendon pain    Lipitor [Atorvastatin]     Muscle aches     Thank you for allowing pharmacy to be a part of this patients care.  Luisa Hart D 12/16/2021 11:26 AM

## 2021-12-16 NOTE — ED Triage Notes (Addendum)
Last known baseline was 6pm at med pass. Found this morning not responding to them, rapid breathing and temp of 103. Has feeding tube. Sats were 90% RA upon EMS arrival. Pulse 100. BP 116/60. Baseline per staff is alert and oriented. Only responding to painful stimuli. Hands very swollen and also not his baseline. Recent ED visit. Stage 2 on coccyx and unstageable boggy wound on R heel

## 2021-12-16 NOTE — ED Notes (Signed)
ED TO INPATIENT HANDOFF REPORT  ED Nurse Name and Phone #: Ivette Loyal Name/Age/Gender [name] [name] 86 y.o. male Room/Bed: WA20/WA20  Code Status   Code Status: DNR  Home/SNF/Other Skilled nursing facility Patient oriented to: self Is this baseline? Yes   Triage Complete: Triage complete  Chief Complaint Sepsis [facility]) [A41.9]  Triage Note Last known baseline was 6pm at med pass. Found this morning not responding to them, rapid breathing and temp of 103. Has feeding tube. Sats were 90% RA upon EMS arrival. Pulse 100. BP 116/60. Baseline per staff is alert and oriented. Only responding to painful stimuli. Hands very swollen and also not his baseline. Recent ED visit. Stage 2 on coccyx and unstageable boggy wound on R heel    Allergies Allergies  Allergen Reactions   Chocolate     Itching if he eats too much chocolate    Ciprofloxacin     Achilles tendon pain    Lipitor [Atorvastatin]     Muscle aches     Level of Care/Admitting Diagnosis ED Disposition     ED Disposition  Admit   Condition  --   Comment  Hospital Area: [place] [[id number]]  Level of Care: Progressive [102]  Admit to Progressive based on following criteria: MULTISYSTEM THREATS such as stable sepsis, metabolic/electrolyte imbalance with or without encephalopathy that is responding to early treatment.  May admit patient to [facility] or [facility] if equivalent level of care is available:: No  Covid Evaluation: Confirmed COVID Negative  Diagnosis: Sepsis [facility]) [id number]  Admitting Physician: Jonnie Finner [id number]  Attending Physician: Jonnie Finner [id number]  Estimated length of stay: past midnight tomorrow  Certification:: I certify this patient will need inpatient services for at least 2 midnights          B Medical/Surgery History Past Medical History:  Diagnosis Date   Allergy    Dementia [facility])    sees Dr. Ellouise Newer    Diverticulosis of colon (without  mention of hemorrhage) 03-01-2004, 04-04-2011   Colonoscopy   ED (erectile dysfunction)    Glaucoma    sees Dr. Crecencio Mc    Hyperglycemia    Hyperlipidemia    Hypertension    Internal hemorrhoids 03-15-1999   Flex Sig    Irritable bowel    Past Surgical History:  Procedure Laterality Date   COLONOSCOPY  04-04-11   per Dr. Sharlett Iles, clear, no repeats needed   EYE SURGERY     bilateral cataract extraction per Dr Dolores Lory   INGUINAL HERNIA REPAIR     IR GASTROSTOMY TUBE MOD SED  11/26/2021   KNEE ARTHROSCOPY     right knee   NASAL SINUS SURGERY       A IV Location/Drains/Wounds Patient Lines/Drains/Airways Status     Active Line/Drains/Airways     Name Placement date Placement time Site Days   Peripheral IV 12/16/21 20 G 1" Left;Posterior Wrist 12/16/21  0545  Wrist  less than 1   Peripheral IV 12/16/21 20 G 1" Anterior;Right Forearm 12/16/21  0530  Forearm  less than 1   Gastrostomy/Enterostomy Gastrostomy 18 Fr. LUQ 11/26/21  1542  LUQ  20   Urethral Catheter I-Li Benjie Karvonen RN Latex;Temperature probe 16 Fr. 12/16/21  [id number]  Latex;Temperature probe  less than 1   Pressure Injury 05/08/21 Coccyx Mid Stage 1 -  Intact skin with non-blanchable redness of a localized area usually over a bony prominence. 05/08/21  0515  -- 222  Pressure Injury 10/25/21 Heel Left Deep Tissue Pressure Injury - Purple or maroon localized area of discolored intact skin or blood-filled blister due to damage of underlying soft tissue from pressure and/or shear. purple wound on heel 10/25/21  0800  -- 52   Pressure Injury 10/25/21 Heel Right Deep Tissue Pressure Injury - Purple or maroon localized area of discolored intact skin or blood-filled blister due to damage of underlying soft tissue from pressure and/or shear. purple wound on heel 10/25/21  0800  -- 52   Pressure Injury 11/18/21 Sacrum 11/18/21  2357  -- 28   Wound / Incision (Open or Dehisced) 11/18/21 Skin tear Thigh Anterior;Left 11/18/21  2355  Thigh  28    Wound / Incision (Open or Dehisced) 11/18/21 Skin tear Pretibial Distal;Left 11/18/21  2355  Pretibial  28   Wound / Incision (Open or Dehisced) 11/18/21 Skin tear Pretibial Distal;Right 11/18/21  2356  Pretibial  28            Intake/Output Last 24 hours  Intake/Output Summary (Last 24 hours) at 12/16/2021 1905 Last data filed at 12/16/2021 0745 Gross per 24 hour  Intake 1000 ml  Output --  Net 1000 ml    Labs/Imaging Results for orders placed or performed during the hospital encounter of 12/16/21 (from the past 48 hour(s))  Comprehensive metabolic panel     Status: Abnormal   Collection Time: 12/16/21  5:35 AM  Result Value Ref Range   Sodium 137 135 - 145 mmol/L   Potassium 4.6 3.5 - 5.1 mmol/L   Chloride 104 98 - 111 mmol/L   CO2 25 22 - 32 mmol/L   Glucose, Bld 185 (H) 70 - 99 mg/dL    Comment: Glucose reference range applies only to samples taken after fasting for at least 8 hours.   BUN 74 (H) 8 - 23 mg/dL   Creatinine, Ser 3.26 (H) 0.61 - 1.24 mg/dL   Calcium 8.0 (L) 8.9 - 10.3 mg/dL   Total Protein 6.5 6.5 - 8.1 g/dL   Albumin 2.1 (L) 3.5 - 5.0 g/dL   AST 25 15 - 41 U/L   ALT 33 0 - 44 U/L   Alkaline Phosphatase 67 38 - 126 U/L   Total Bilirubin 0.4 0.3 - 1.2 mg/dL   GFR, Estimated 18 (L) >60 mL/min    Comment: (NOTE) Calculated using the CKD-EPI Creatinine Equation (2021)    Anion gap 8 5 - 15    Comment: Performed at [facility], [place] [address], [place], [place] [address]  Lactic acid, plasma     Status: None   Collection Time: 12/16/21  5:35 AM  Result Value Ref Range   Lactic Acid, Venous 1.3 0.5 - 1.9 mmol/L    Comment: Performed at [facility], [place] [address], [place], [place] [address]  CBC with Differential     Status: Abnormal   Collection Time: 12/16/21  5:35 AM  Result Value Ref Range   WBC 9.7 4.0 - 10.5 K/uL   RBC 3.86 (L) 4.22 - 5.81 MIL/uL   Hemoglobin 10.4 (L) 13.0 - 17.0 g/dL   HCT 32.7 (L)  39.0 - 52.0 %   MCV 84.7 80.0 - 100.0 fL   MCH 26.9 26.0 - 34.0 pg   MCHC 31.8 30.0 - 36.0 g/dL   RDW 15.3 11.5 - 15.5 %   Platelets 409 (H) 150 - 400 K/uL   nRBC 0.0 0.0 - 0.2 %   Neutrophils Relative %  77 %   Neutro Abs 7.4 1.7 - 7.7 K/uL   Lymphocytes Relative 6 %   Lymphs Abs 0.6 (L) 0.7 - 4.0 K/uL   Monocytes Relative 15 %   Monocytes Absolute 1.5 (H) 0.1 - 1.0 K/uL   Eosinophils Relative 1 %   Eosinophils Absolute 0.1 0.0 - 0.5 K/uL   Basophils Relative 0 %   Basophils Absolute 0.0 0.0 - 0.1 K/uL   Immature Granulocytes 1 %   Abs Immature Granulocytes 0.05 0.00 - 0.07 K/uL    Comment: Performed at [facility], [place] [address], [place], [place] [address]  Protime-INR     Status: Abnormal   Collection Time: 12/16/21  5:35 AM  Result Value Ref Range   Prothrombin Time 15.4 (H) 11.4 - 15.2 seconds   INR 1.2 0.8 - 1.2    Comment: (NOTE) INR goal varies based on device and disease states. Performed at [facility], [place] [address], [place], [place] [address]   Urinalysis, Routine w reflex microscopic Urine, Clean Catch     Status: Abnormal   Collection Time: 12/16/21  5:35 AM  Result Value Ref Range   Color, Urine YELLOW YELLOW   APPearance CLOUDY (A) CLEAR   Specific Gravity, Urine 1.020 1.005 - 1.030   pH 6.0 5.0 - 8.0   Glucose, UA NEGATIVE NEGATIVE mg/dL   Hgb urine dipstick MODERATE (A) NEGATIVE   Bilirubin Urine NEGATIVE NEGATIVE   Ketones, ur NEGATIVE NEGATIVE mg/dL   Protein, ur NEGATIVE NEGATIVE mg/dL   Nitrite NEGATIVE NEGATIVE   Leukocytes,Ua NEGATIVE NEGATIVE   RBC / HPF 21-50 0 - 5 RBC/hpf   WBC, UA 11-20 0 - 5 WBC/hpf   Bacteria, UA NONE SEEN NONE SEEN   Mucus PRESENT    Hyaline Casts, UA PRESENT     Comment: Performed at [facility], [place] [address], [place], [place] [address]  Resp Panel by RT-PCR (Flu A&B, Covid) Nasopharyngeal Swab     Status: None   Collection Time: 12/16/21  5:35 AM    Specimen: Nasopharyngeal Swab; Nasopharyngeal(NP) swabs in vial transport medium  Result Value Ref Range   SARS Coronavirus 2 by RT PCR NEGATIVE NEGATIVE    Comment: (NOTE) SARS-CoV-2 target nucleic acids are NOT DETECTED.  The SARS-CoV-2 RNA is generally detectable in upper respiratory specimens during the acute phase of infection. The lowest concentration of SARS-CoV-2 viral copies this assay can detect is 138 copies/mL. A negative result does not preclude SARS-Cov-2 infection and should not be used as the sole basis for treatment or other patient management decisions. A negative result may occur with  improper specimen collection/handling, submission of specimen other than nasopharyngeal swab, presence of viral mutation(s) within the areas targeted by this assay, and inadequate number of viral copies(<138 copies/mL). A negative result must be combined with clinical observations, patient history, and epidemiological information. The expected result is Negative.  Fact Sheet for Patients:  EntrepreneurPulse.com.au  Fact Sheet for Healthcare Providers:  IncredibleEmployment.be  This test is no t yet approved or cleared by the [place] FDA and  has been authorized for detection and/or diagnosis of SARS-CoV-2 by FDA under an Emergency Use Authorization (EUA). This EUA will remain  in effect (meaning this test can be used) for the duration of the COVID-19 declaration under Section 564(b)(1) of the Act, 21 U.S.C.section 360bbb-3(b)(1), unless the authorization is terminated  or revoked sooner.       Influenza A by PCR NEGATIVE NEGATIVE  Influenza B by PCR NEGATIVE NEGATIVE    Comment: (NOTE) The Xpert Xpress SARS-CoV-2/FLU/RSV plus assay is intended as an aid in the diagnosis of influenza from Nasopharyngeal swab specimens and should not be used as a sole basis for treatment. Nasal washings and aspirates are unacceptable for Xpert Xpress  SARS-CoV-2/FLU/RSV testing.  Fact Sheet for Patients: EntrepreneurPulse.com.au  Fact Sheet for Healthcare Providers: IncredibleEmployment.be  This test is not yet approved or cleared by the [place] FDA and has been authorized for detection and/or diagnosis of SARS-CoV-2 by FDA under an Emergency Use Authorization (EUA). This EUA will remain in effect (meaning this test can be used) for the duration of the COVID-19 declaration under Section 564(b)(1) of the Act, 21 U.S.C. section 360bbb-3(b)(1), unless the authorization is terminated or revoked.  Performed at [facility], [place] [address], [place], [place] [address]   Lactic acid, plasma     Status: None   Collection Time: 12/16/21  8:11 AM  Result Value Ref Range   Lactic Acid, Venous 1.6 0.5 - 1.9 mmol/L    Comment: Performed at [facility], [place] [address] [place], [place] [address]  CBC     Status: Abnormal   Collection Time: 12/16/21 12:47 PM  Result Value Ref Range   WBC 11.0 (H) 4.0 - 10.5 K/uL   RBC 3.77 (L) 4.22 - 5.81 MIL/uL   Hemoglobin 10.0 (L) 13.0 - 17.0 g/dL   HCT 32.4 (L) 39.0 - 52.0 %   MCV 85.9 80.0 - 100.0 fL   MCH 26.5 26.0 - 34.0 pg   MCHC 30.9 30.0 - 36.0 g/dL   RDW 15.0 11.5 - 15.5 %   Platelets 388 150 - 400 K/uL   nRBC 0.0 0.0 - 0.2 %    Comment: Performed at [facility], [place] [address], [place], [place] [address]  Creatinine, serum     Status: Abnormal   Collection Time: 12/16/21 12:47 PM  Result Value Ref Range   Creatinine, Ser 1.72 (H) 0.61 - 1.24 mg/dL    Comment: DELTA CHECK NOTED   GFR, Estimated 39 (L) >60 mL/min    Comment: (NOTE) Calculated using the CKD-EPI Creatinine Equation (2021) Performed at [facility], [place] [address], [place], [place] [address]   MRSA Next Gen by PCR, Nasal     Status: None   Collection Time: 12/16/21 12:47 PM   Specimen: Nasal Mucosa;  Nasal Swab  Result Value Ref Range   MRSA by PCR Next Gen NOT DETECTED NOT DETECTED    Comment: (NOTE) The GeneXpert MRSA Assay (FDA approved for NASAL specimens only), is one component of a comprehensive MRSA colonization surveillance program. It is not intended to diagnose MRSA infection nor to guide or monitor treatment for MRSA infections. Test performance is not FDA approved in patients less than 41 years old. Performed at [facility], [place] [address] [place], [place] [address]    US RENAL  Result Date: 12/16/2021 CLINICAL DATA:  Acute kidney injury EXAM: RENAL / URINARY TRACT ULTRASOUND COMPLETE COMPARISON:  CT abdomen 11/23/2021 FINDINGS: Right Kidney: Renal measurements: 12.3 x 5.3 x 5 cm = volume: 169.26 mL. Echogenicity within normal limits. No mass or hydronephrosis visualized. 13 mm calcified mass in the upper pole of the right kidney consistent with a calcified hemorrhagic or proteinaceous cyst. No further evaluation recommended. Left Kidney: Renal measurements: 11.3 x 5.6 x 4.2 cm = volume: 139.82 mL. Echogenicity within normal limits. No mass  or hydronephrosis visualized. Bladder: Foley catheter in the bladder.  Bladder is decompressed. Other: None. IMPRESSION: 1. No obstructive uropathy. Electronically Signed   By: Elige Ko M.D.   On: 12/16/2021 13:44   DG Chest Port 1 View  Result Date: 12/16/2021 CLINICAL DATA:  Shortness of breath. EXAM: PORTABLE CHEST 1 VIEW COMPARISON:  12/13/2021 FINDINGS: 0605 hours. Bibasilar atelectasis or infiltrate noted with small left pleural effusion. Cardiopericardial silhouette is at upper limits of normal for size. The visualized bony structures of the thorax show no acute abnormality. Telemetry leads overlie the chest. IMPRESSION: Bibasilar atelectasis or infiltrate with small left pleural effusion. Electronically Signed   By: Kennith Center M.D.   On: 12/16/2021 06:13    Pending Labs Unresulted Labs (From admission,  onward)     Start     Ordered   12/17/21 0500  Protime-INR  Tomorrow morning,   R        12/16/21 1142   12/17/21 0500  Cortisol-am, blood  Tomorrow morning,   R        12/16/21 1142   12/17/21 0500  Procalcitonin  Tomorrow morning,   R        12/16/21 1142   12/17/21 0500  Comprehensive metabolic panel  Tomorrow morning,   R        12/16/21 1142   12/17/21 0500  CBC  Tomorrow morning,   R        12/16/21 1142   12/16/21 0535  Culture, blood (Routine x 2)  BLOOD CULTURE X 2,   STAT      12/16/21 0534            Vitals/Pain Today's Vitals   12/16/21 1730 12/16/21 1800 12/16/21 1830 12/16/21 1900  BP: 112/75 126/72 110/70 116/66  Pulse: 80 82 79 84  Resp: (!) 21 (!) 21 (!) 24 17  Temp: (!) 97.4 F (36.3 C) (!) 97.4 F (36.3 C) (!) 97.4 F (36.3 C) (!) 97.4 F (36.3 C)  TempSrc:      SpO2: 100% 100% 100% 100%    Isolation Precautions No active isolations  Medications Medications  levETIRAcetam (KEPPRA) 100 MG/ML solution 500 mg (has no administration in time range)  memantine (NAMENDA) tablet 10 mg (has no administration in time range)  heparin injection 5,000 Units (5,000 Units Subcutaneous Given 12/16/21 1629)  acetaminophen (TYLENOL) tablet 650 mg (has no administration in time range)    Or  acetaminophen (TYLENOL) suppository 650 mg (has no administration in time range)  albuterol (PROVENTIL) (2.5 MG/3ML) 0.083% nebulizer solution 2.5 mg (has no administration in time range)  0.9 %  sodium chloride infusion ( Intravenous New Bag/Given 12/16/21 1301)  ceFEPIme (MAXIPIME) 2 g in sodium chloride 0.9 % 100 mL IVPB (has no administration in time range)  vancomycin variable dose per unstable renal function (pharmacist dosing) (has no administration in time range)  valproic acid (DEPAKENE) 250 MG/5ML solution 500 mg (has no administration in time range)    And  valproic acid (DEPAKENE) 250 MG/5ML solution 1,000 mg (has no administration in time range)  lactated  ringers bolus 1,000 mL (0 mLs Intravenous Stopped 12/16/21 0745)  ceFEPIme (MAXIPIME) 2 g in sodium chloride 0.9 % 100 mL IVPB (0 g Intravenous Stopped 12/16/21 0716)  metroNIDAZOLE (FLAGYL) IVPB 500 mg (0 mg Intravenous Stopped 12/16/21 0911)  vancomycin (VANCOCIN) IVPB 1000 mg/200 mL premix (0 mg Intravenous Stopped 12/16/21 0910)    Mobility non-ambulatory High fall risk   Focused Assessments  R Recommendations: See Admitting Provider Note  Report given to:   Additional Notes:

## 2021-12-17 DIAGNOSIS — Z515 Encounter for palliative care: Secondary | ICD-10-CM

## 2021-12-17 DIAGNOSIS — A419 Sepsis, unspecified organism: Secondary | ICD-10-CM | POA: Diagnosis not present

## 2021-12-17 DIAGNOSIS — R531 Weakness: Secondary | ICD-10-CM | POA: Diagnosis not present

## 2021-12-17 DIAGNOSIS — Z7189 Other specified counseling: Secondary | ICD-10-CM | POA: Diagnosis not present

## 2021-12-17 LAB — CBC
HCT: 27.9 % — ABNORMAL LOW (ref 39.0–52.0)
Hemoglobin: 8.6 g/dL — ABNORMAL LOW (ref 13.0–17.0)
MCH: 26.5 pg (ref 26.0–34.0)
MCHC: 30.8 g/dL (ref 30.0–36.0)
MCV: 85.8 fL (ref 80.0–100.0)
Platelets: 339 10*3/uL (ref 150–400)
RBC: 3.25 MIL/uL — ABNORMAL LOW (ref 4.22–5.81)
RDW: 14.7 % (ref 11.5–15.5)
WBC: 10.9 10*3/uL — ABNORMAL HIGH (ref 4.0–10.5)
nRBC: 0 % (ref 0.0–0.2)

## 2021-12-17 LAB — PROTIME-INR
INR: 1.3 — ABNORMAL HIGH (ref 0.8–1.2)
Prothrombin Time: 16.4 seconds — ABNORMAL HIGH (ref 11.4–15.2)

## 2021-12-17 LAB — COMPREHENSIVE METABOLIC PANEL
ALT: 43 U/L (ref 0–44)
AST: 39 U/L (ref 15–41)
Albumin: 1.7 g/dL — ABNORMAL LOW (ref 3.5–5.0)
Alkaline Phosphatase: 60 U/L (ref 38–126)
Anion gap: 4 — ABNORMAL LOW (ref 5–15)
BUN: 31 mg/dL — ABNORMAL HIGH (ref 8–23)
CO2: 27 mmol/L (ref 22–32)
Calcium: 8.1 mg/dL — ABNORMAL LOW (ref 8.9–10.3)
Chloride: 110 mmol/L (ref 98–111)
Creatinine, Ser: 0.59 mg/dL — ABNORMAL LOW (ref 0.61–1.24)
GFR, Estimated: >60 mL/min (ref >60)
Glucose, Bld: 96 mg/dL (ref 70–99)
Potassium: 4.3 mmol/L (ref 3.5–5.1)
Sodium: 141 mmol/L (ref 135–145)
Total Bilirubin: 0.6 mg/dL (ref 0.3–1.2)
Total Protein: 5.2 g/dL — ABNORMAL LOW (ref 6.5–8.1)

## 2021-12-17 LAB — CORTISOL-AM, BLOOD: Cortisol - AM: 17.1 ug/dL (ref 6.7–22.6)

## 2021-12-17 LAB — PROCALCITONIN: Procalcitonin: 0.3 ng/mL

## 2021-12-17 LAB — GLUCOSE, CAPILLARY: Glucose-Capillary: 92 mg/dL (ref 70–99)

## 2021-12-17 MED ORDER — SODIUM CHLORIDE 0.9 % IV SOLN
3.0000 g | Freq: Four times a day (QID) | INTRAVENOUS | Status: DC
  Administered 2021-12-17 – 2021-12-20 (×13): 3 g via INTRAVENOUS
  Filled 2021-12-17 (×15): qty 8

## 2021-12-17 MED ORDER — JEVITY 1.2 CAL PO LIQD
1000.0000 mL | ORAL | Status: DC
  Administered 2021-12-17: 17:00:00 1000 mL
  Administered 2021-12-18: 19:00:00 30 mL
  Filled 2021-12-17 (×6): qty 1000

## 2021-12-17 MED ORDER — FREE WATER
150.0000 mL | Freq: Four times a day (QID) | Status: DC
  Administered 2021-12-17 – 2021-12-20 (×10): 150 mL

## 2021-12-17 MED ORDER — JEVITY 1.5 CAL/FIBER PO LIQD
1000.0000 mL | ORAL | Status: DC
  Filled 2021-12-17: qty 1000

## 2021-12-17 MED ORDER — PROSOURCE TF PO LIQD
45.0000 mL | Freq: Every day | ORAL | Status: DC
  Administered 2021-12-17 – 2021-12-20 (×4): 45 mL
  Filled 2021-12-17 (×4): qty 45

## 2021-12-17 NOTE — Consult Note (Signed)
Consultation Note Date: 12/17/2021   Patient Name: [name] [name]  DOB: 12/29/1934  MRN: [id number]  Age / Sex: 86 y.o., male  PCP: [name], MD Referring Physician: Cipriano Bunker, MD  Reason for Consultation: Establishing goals of care  HPI/Patient Profile: 86 y.o. male admitted on 12/16/2021    Clinical Assessment and Goals of Care: 86 year old gentleman, known to palliative medicine service, seen extensively by my colleague Yong Channel, NP in most recent hospitalization.  Currently enrolled in hospice services at his facility [facility], history of dementia seizure disorders, BPH, recurrent aspiration pneumonias requiring hospitalization. Patient admitted to hospital medicine service for sepsis secondary to aspiration pneumonia.  Patient being followed by hospice nurses.  In a previous hospitalization, extensive goals of care discussions were undertaken between palliative care and patient's family namely wife and son.  At that time, they did not wish to discontinue patient's tube feedings and hence patient was not discharged to a residential hospice setting.  Patient currently admitted under hospital medicine service with hospice nurses following, remains on antibiotics as per family's goals of wishes.Not able to reach wife Alvira Philips [phone].   HCPOA Wife and son.  SUMMARY OF RECOMMENDATIONS   Agree with DNR Agree with current mode of care Connected with hospice services and hospice colleagues are following.  Continue with resuming hospice services when patient is stable enough to go back to his nursing facility. No additional palliative care specific recommendations at this time. Thank you for the consult.  Code Status/Advance Care Planning: DNR   Symptom Management:     Palliative Prophylaxis:  Delirium Protocol   Psycho-social/Spiritual:  Desire for further Chaplaincy  support:yes Additional Recommendations: Caregiving  Support/Resources  Prognosis:  < 6 months  Discharge Planning: Skilled Nursing Facility with Hospice      Primary Diagnoses: Present on Admission:  Sepsis (HCC)   I have reviewed the medical record, interviewed the patient and family, and examined the patient. The following aspects are pertinent.  Past Medical History:  Diagnosis Date   Allergy    Dementia [facility])    sees Dr. Patrcia Dolly    Diverticulosis of colon (without mention of hemorrhage) 03-01-2004, 04-04-2011   Colonoscopy   ED (erectile dysfunction)    Glaucoma    sees Dr. Arvil Chaco    Hyperglycemia    Hyperlipidemia    Hypertension    Internal hemorrhoids 03-15-1999   Flex Sig    Irritable bowel    Social History   Socioeconomic History   Marital status: Married    Spouse name: Not on file   Number of children: 2   Years of education: 11   Highest education level: Not on file  Occupational History   Occupation: retired    Associate Professor: RETIRED  Tobacco Use   Smoking status: Former    Types: Cigarettes    Quit date: 03/20/1969    Years since quitting: 52.7   Smokeless tobacco: Never  Vaping Use   Vaping Use: Never used  Substance and Sexual Activity  Alcohol use: Yes    Alcohol/week: 0.0 standard drinks    Comment: occ   Drug use: No   Sexual activity: Not on file  Other Topics Concern   Not on file  Social History Narrative   Right handed   Drinks caffeine   One story home   Social Determinants of Health   Financial Resource Strain: Not on file  Food Insecurity: Not on file  Transportation Needs: Not on file  Physical Activity: Not on file  Stress: Not on file  Social Connections: Not on file   Family History  Problem Relation Age of Onset   Hypertension Mother    Lung cancer Father    Colitis Daughter    Scheduled Meds:  Chlorhexidine Gluconate Cloth  6 each Topical Daily   feeding supplement (PROSource TF)  45 mL Per Tube  Daily   free water  150 mL Per Tube Q6H   heparin  5,000 Units Subcutaneous Q8H   levETIRAcetam  500 mg Per Tube BID   memantine  10 mg Per Tube QHS   valproic acid  500 mg Per Tube QHS   And   valproic acid  1,000 mg Per Tube Daily   Continuous Infusions:  sodium chloride 100 mL/hr at 12/17/21 0941   ampicillin-sulbactam (UNASYN) IV 3 g (12/17/21 1304)   feeding supplement (JEVITY 1.2 CAL)     PRN Meds:.acetaminophen **OR** acetaminophen, albuterol Medications Prior to Admission:  Prior to Admission medications   Medication Sig Start Date End Date Taking? Authorizing Provider  acetaminophen (TYLENOL) 500 MG tablet Place 1 tablet (500 mg total) into feeding tube every 8 (eight) hours as needed. Patient taking differently: Place 500 mg into feeding tube every 8 (eight) hours as needed for moderate pain. 12/14/21  Yes Sherwood Gambler, MD  albuterol (PROVENTIL) (2.5 MG/3ML) 0.083% nebulizer solution Take 3 mLs (2.5 mg total) by nebulization every 2 (two) hours as needed for wheezing or shortness of breath. 12/14/21  Yes Sherwood Gambler, MD  Amino Acids-Protein Hydrolys (PROTEINEX P18) LIQD Place 45 mLs into feeding tube daily.   Yes [provider]  amoxicillin-clavulanate (AUGMENTIN) 875-125 MG tablet Place 1 tablet into feeding tube every 12 (twelve) hours for 3 days. Patient taking differently: Place 1 tablet into feeding tube every 12 (twelve) hours. Start date : 12/11/21 12/14/21 12/17/21 Yes Sherwood Gambler, MD  dorzolamide (TRUSOPT) 2 % ophthalmic solution Place 1 drop into both eyes 3 (three) times daily. 12/14/21  Yes Sherwood Gambler, MD  fish oil-omega-3 fatty acids 1000 MG capsule Place 1 capsule (1 g total) into feeding tube daily. 12/14/21  Yes Sherwood Gambler, MD  Glucosamine 500 MG CAPS Take 1 capsule (500 mg total) by mouth daily. Patient taking differently: 500 mg by PEG Tube route daily. 12/14/21  Yes Sherwood Gambler, MD  guaiFENesin (ROBITUSSIN) 100 MG/5ML liquid  Place 15 mLs into feeding tube every 4 (four) hours as needed for cough or to loosen phlegm. 12/14/21  Yes Sherwood Gambler, MD  latanoprost (XALATAN) 0.005 % ophthalmic solution Place 2 drops into both eyes at bedtime. 12/14/21  Yes Sherwood Gambler, MD  levETIRAcetam (KEPPRA) 100 MG/ML solution Place 5 mLs (500 mg total) into feeding tube 2 (two) times daily. 12/14/21  Yes Sherwood Gambler, MD  melatonin 5 MG TABS Place 2 tablets (10 mg total) into feeding tube at bedtime. 12/14/21  Yes Sherwood Gambler, MD  memantine (NAMENDA) 10 MG tablet Place 1 tablet (10 mg total) into feeding tube at bedtime. 12/14/21  Yes Sherwood Gambler, MD  Multiple Vitamin (MULTIVITAMIN) LIQD Place 5 mLs into feeding tube daily. 12/14/21 03/14/22 Yes Sherwood Gambler, MD  Nutritional Supplements (FEEDING SUPPLEMENT, JEVITY 1.2 CAL,) LIQD Place 1,000 mLs into feeding tube continuous. 70 ml /hr via G tube continuously with 150 ml auto flush every 6 hours continuously.   Yes [provider]  RHOPRESSA 0.02 % SOLN Place 1 drop into both eyes at bedtime. 08/17/19  Yes [provider]  saccharomyces boulardii (FLORASTOR) 250 MG capsule Place 1 capsule (250 mg total) into feeding tube 2 (two) times daily for 20 days. 12/14/21 01/03/22 Yes Sherwood Gambler, MD  triamcinolone cream (KENALOG) 0.1 % Apply 1 application topically 3 (three) times daily as needed (irritation).   Yes [provider]  valproic acid (DEPAKENE) 250 MG/5ML solution Place 10-20 mLs (500-1,000 mg total) into feeding tube See admin instructions. 1000 mg daily and 500 mg at bedtime Patient taking differently: Place 1,000 mg into feeding tube daily. 12/14/21 01/13/22 Yes Sherwood Gambler, MD  vitamin C (ASCORBIC ACID) 250 MG tablet Place 1 tablet (250 mg total) into feeding tube daily. 12/14/21  Yes Sherwood Gambler, MD  Water For Irrigation, Sterile (FREE WATER) SOLN Place 150 mLs into feeding tube every 6 (six) hours. 12/14/21 01/13/22 Yes Sherwood Gambler, MD  zinc oxide 20 % ointment Apply 1 application topically 2 (two) times daily. To buttocks for wound care 12/14/21 01/13/22 Yes Sherwood Gambler, MD  glycopyrrolate (ROBINUL) 1 MG tablet Place 2 tablets (2 mg total) into feeding tube 3 (three) times daily. Patient not taking: Reported on 12/16/2021 12/14/21 01/13/22  Sherwood Gambler, MD  glycopyrrolate (ROBINUL) 1 MG tablet Take 1 tablet (1 mg total) by mouth 4 (four) times daily as needed (excessive secretions). Patient not taking: Reported on 12/16/2021 12/14/21   Sherwood Gambler, MD  Nutritional Supplements (FEEDING SUPPLEMENT, PROSOURCE TF,) liquid Place 45 mLs into feeding tube daily. Patient not taking: Reported on 12/16/2021 12/14/21 01/13/22  Sherwood Gambler, MD   Allergies  Allergen Reactions   Chocolate     Itching if he eats too much chocolate    Ciprofloxacin     Achilles tendon pain    Lipitor [Atorvastatin]     Muscle aches    Review of Systems Patient is awake, reasonably alert is able to state his name Physical Exam Patient is awake, reasonably alert, able to state his name Tracks me in the room, attempts to respond appropriately to questions asked Does not appear to be in distress Has regular work of breathing S1-S2 Does not have edema   Vital Signs: BP (!) 119/58    Pulse 76    Temp 98.6 F (37 C) (Oral)    Resp 20    Wt 75.3 kg    SpO2 99%    BMI 21.90 kg/m  Pain Scale: 0-10 POSS *See Group Information*: S-Acceptable,Sleep, easy to arouse Pain Score: Asleep   SpO2: SpO2: 99 % O2 Device:SpO2: 99 % O2 Flow Rate: .O2 Flow Rate (L/min): 4 L/min  IO: Intake/output summary:  Intake/Output Summary (Last 24 hours) at 12/17/2021 1539 Last data filed at 12/17/2021 1300 Gross per 24 hour  Intake 1837.13 ml  Output 825 ml  Net 1012.13 ml    LBM: Last BM Date: 12/16/21 Baseline Weight: Weight: 75.3 kg Most recent weight: Weight: 75.3 kg     Palliative Assessment/Data:   PPS 30%  Time In:  1400 Time  Out:  1500 Time Total:  60 Greater than 50%  of  this time was spent counseling and coordinating care related to the above assessment and plan.  Signed by: Loistine Chance, MD   Please contact Palliative Medicine Team phone at [phone] for questions and concerns.  For individual provider: See Shea Evans

## 2021-12-17 NOTE — Consult Note (Signed)
WOC Nurse Consult Note: Patient now in 1416. Reason for Consult: multiple wounds. This is a duplicate order. Original order received and responded to by me yesterday. I personally assessed the patient yesterday while he was in the ED and entered orders. I have reviewed the wound care order section, and those orders are in place. Please follow them to care for the wounds assessed yesterday. WOC nurse will not follow at this time.  Please re-consult the WOC team if needed.  Helmut Muster, RN, MSN, CWOCN, CNS-BC, pager [phone]

## 2021-12-17 NOTE — Progress Notes (Addendum)
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 Dr. Eulas Post with [facility], is reviewing if this will be a related hospital admission.    MSW met with patient at bedside. Patient resting during visit and did not arouse to MSW voice. No family/visitors present at bedside.  Patient remains inpatient appropriate due to symptom management/treatment. Per MD/Kumar, patient anticipated to discharge before/on Sunday. [facility] will continue to follow    V/S:  Temp: 98.1 (oral) BP: 114/68 Pulse: 81 Resp: 18 SpO2: 100 02: 4L/min  I/O:  +1,000 P/O: 0 IV piggyback: 1,000  Abnormal Labs:   Latest Reference Range & Units 12/17/21 05:19  BUN 8 - 23 mg/dL 31 (H)  Creatinine 0.61 - 1.24 mg/dL 0.59 (L)  Calcium 8.9 - 10.3 mg/dL 8.1 (L)  Anion gap 5 - 15  4 (L)  Albumin 3.5 - 5.0 g/dL 1.7 (L)  Total Protein 6.5 - 8.1 g/dL 5.2 (L)  WBC 4.0 - 10.5 K/uL 10.9 (H)  RBC 4.22 - 5.81 MIL/uL 3.25 (L)  Hemoglobin 13.0 - 17.0 g/dL 8.6 (L)  HCT 39.0 - 52.0 % 27.9 (L)   Diagnostics:  Sepsis secondary to aspiration pneumonia: Patient presented with SIRS criteria, Chest x-ray shows possible infiltrate. Continue broad-spectrum antibiotics.  Continue DuoNeb nebulization. Continue IV hydration, follow blood cultures, urine cultures. Lactic acid 1.3, procalcitonin 2.0, no leukocytosis.  MRSA nares negative. Antibiotic de-escalated to Unasyn.  Vancomycin discontinued. Sepsis physiology is improving.   Acute kidney injury: > Resolved. Suspect prerenal due to decreased p.o. intake Continue IV hydration, renal functions back to normal.   Acute on chronic metabolic encephalopathy: This could be secondary to aspiration pneumonia. Patient seems back to baseline mental status now. Continue dementia medication.   Dysphagia: Patient has PEG tube in place.  Resume tube feedings.    Seizure disorder: Continue seizure medication through PEG tube   Sacral decubitus POA: Wound care consult. acetaminophen (TYLENOL) tablet 650 mg Dose: 650 mg Freq: Every 6 hours PRN Route: PER TUBE PRN Reasons: mild pain,fever PRN Comment: or Fever >/= 101 Start: 12/16/21 1142  Admin Instructions:  may give suppository if unable to take PO Maximum dose of acetaminophen in 4000 mg from all sources in 24 hours.  IV/PRN: albuterol (PROVENTIL) (2.5 MG/3ML) 0.083% nebulizer solution 2.5 mg Dose: 2.5 mg Freq: Every 2 hours PRN Route: NEBULIZATION PRN Reason: wheezing Start: 12/16/21 1142  Problem List:  Sepsis ([place]) 12/16/2021   Malnutrition of moderate degree 12/10/2021   Multifocal pneumonia 19/62/2297   Acute metabolic encephalopathy 98/92/1194   Acute encephalopathy 11/18/2021   Recurrent pneumonia 10/20/2021   Pressure injury of skin 05/11/2021   Pericardial effusion, acute     COVID     Palliative care by specialist     Goals of care, counseling/discussion     General weakness     Syncope 05/07/2021   Seizure ([place]) 03/09/2021   Dementia ([place]) 02/25/2021   BPH with urinary obstruction 12/22/2020   Ankle edema, bilateral 07/13/2020   Hallucinations 02/09/2020   Abdominal pain, acute 08/21/2014   GI bleeding 05/26/2012   Renal mass 05/26/2012   Diverticulosis of colon with hemorrhage 05/26/2012   SHOULDER PAIN, BILATERAL 07/30/2010   HERPES SIMPLEX INFECTION 10/10/2007   TACHYCARDIA, PAROXYSMAL NOS 10/10/2007   Dyslipidemia 07/11/2007   Essential hypertension 07/11/2007   ALLERGIC RHINITIS 07/11/2007   Discharge Planning:  To return to  [facility] with  hospice care as before admission  Family Contact:  MSW contacted patient [name]/[phone]/ via TC. No answer. MSW left VM requesting call to be returned. Call returned and wife currently in route to hospital. Confirmed that patient will be returning back to LTC once medically cleared for discharge.    IDT:  Updated  Goals of Care:  Patient to remain under hospice services   Please call with any questions/concerns.    Thank you for the opportunity to participate in this patient's care.   Daphene Calamity, MSW [facility] Liaison  [phone]

## 2021-12-17 NOTE — Progress Notes (Signed)
Goal for tube feeds will remain at 30 ml/hr overnight per family request. MD aware. Provide will reassess in the AM?

## 2021-12-17 NOTE — Progress Notes (Signed)
Pharmacy Antibiotic Note  [name] [name] is a 86 y.o. male admitted on 12/16/2021 with SIRS, recurrent aspiration PNA.  MRSA PCR negative. Pharmacy consulted to switch cefepime to Unasyn.  SCr now returned to near baseline (~0.5)  Plan: D/c cefepime and vancomycin Start Unasyn 3g IV q6 hours  Dose adjustments for Unasyn unlikely at this time. Will formally sign off consult and continue to monitor peripherally.  Weight: 75.3 kg (166 lb 0.1 oz)  Temp (24hrs), Avg:97.6 F (36.4 C), Min:97.3 F (36.3 C), Max:98.6 F (37 C)  Recent Labs  Lab 12/13/21 2157 12/16/21 0535 12/16/21 0811 12/16/21 1247 12/17/21 0519  WBC 9.8 9.7  --  11.0* 10.9*  CREATININE 0.94 3.26*  --  1.72* 0.59*  LATICACIDVEN  --  1.3 1.6  --   --      Estimated Creatinine Clearance: 73.2 mL/min (A) (by C-G formula based on SCr of 0.59 mg/dL (L)).    Allergies  Allergen Reactions   Chocolate     Itching if he eats too much chocolate    Ciprofloxacin     Achilles tendon pain    Lipitor [Atorvastatin]     Muscle aches     Thank you for allowing pharmacy to be a part of this patients care.  Rexford Maus, PharmD 12/17/2021 10:48 AM

## 2021-12-17 NOTE — Progress Notes (Signed)
PROGRESS NOTE    [name] [name]  XVQ:[id number] DOB: 01/21/1935 DOA: 12/16/2021  PCP: [name], MD   Brief Narrative:  This 86 years old male with PMH significant for dementia, seizure disorder, BPH, recurrent aspiration pneumonia was brought from [place] skilled nursing facility for increased somnolence and shortness of breath.  History is obtained from chart review since patient is somnolent and has history of dementia.  He was also found to be febrile at nursing home. Was hypoxic initially placed on nonrebreather but then weaned down to room air. Patient is admitted for sepsis secondary to aspiration pneumonia and started on IV antibiotics.  Patient is a hospice patient followed by [facility].  Assessment & Plan:   Principal Problem:   Sepsis (HCC)   Sepsis secondary to aspiration pneumonia: Patient presented with SIRS criteria, Chest x-ray shows possible infiltrate. Continue broad-spectrum antibiotics.  Continue DuoNeb nebulization. Continue IV hydration, follow blood cultures, urine cultures. Lactic acid 1.3, procalcitonin 2.0, no leukocytosis.  MRSA nares negative. Antibiotic de-escalated to Unasyn.  Vancomycin discontinued. Sepsis physiology is improving.  Acute kidney injury: > Resolved. Suspect prerenal due to decreased p.o. intake Continue IV hydration, renal functions back to normal.  Acute on chronic metabolic encephalopathy: This could be secondary to aspiration pneumonia. Patient seems back to baseline mental status now. Continue dementia medication.  Dysphagia: Patient has PEG tube in place.  Resume tube feedings.  Seizure disorder: Continue seizure medication through PEG tube  Sacral decubitus POA: Wound care consult.  Goals of care discussion: Family is in the middle of conversation with hospice team for possible outpatient hospice.  It sounds like it was about to be arranged.  Family wants to continue antibiotics for pneumonia.  Family  will make decision about making him comfort care during this admission,  decision has to be made.  Palliative care is consulted.  DVT prophylaxis: Heparin Code Status: DNR Family Communication: No family at bedside Disposition Plan:   Status is: Inpatient  Remains inpatient appropriate because: Admitted for aspiration pneumonia.  Requiring IV antibiotics  Patient may return back to skilled nursing facility in 1 to 2 days.  Consultants:  None  Procedures: None   Antimicrobials:   Anti-infectives (From admission, onward)    Start     Dose/Rate Route Frequency Ordered Stop   12/17/21 1200  Ampicillin-Sulbactam (UNASYN) 3 g in sodium chloride 0.9 % 100 mL IVPB        3 g 200 mL/hr over 30 Minutes Intravenous Every 6 hours 12/17/21 1046     12/17/21 0700  ceFEPIme (MAXIPIME) 2 g in sodium chloride 0.9 % 100 mL IVPB  Status:  Discontinued        2 g 200 mL/hr over 30 Minutes Intravenous Every 24 hours 12/16/21 1216 12/17/21 1046   12/16/21 1222  vancomycin variable dose per unstable renal function (pharmacist dosing)  Status:  Discontinued         Does not apply See admin instructions 12/16/21 1222 12/17/21 1045   12/16/21 0630  ceFEPIme (MAXIPIME) 2 g in sodium chloride 0.9 % 100 mL IVPB        2 g 200 mL/hr over 30 Minutes Intravenous  Once 12/16/21 0616 12/16/21 0716   12/16/21 0630  metroNIDAZOLE (FLAGYL) IVPB 500 mg        500 mg 100 mL/hr over 60 Minutes Intravenous  Once 12/16/21 0616 12/16/21 0911   12/16/21 0630  vancomycin (VANCOCIN) IVPB 1000 mg/200 mL premix  1,000 mg 200 mL/hr over 60 Minutes Intravenous  Once 12/16/21 4098 12/16/21 0910       Subjective: Patient was seen and examined at bedside.  Overnight events noted.  Patient seems like back to his baseline mental status.  He is alert and oriented x1 He is following commands.  Appears chronically ill looking.  Objective: Vitals:   12/16/21 2112 12/17/21 0034 12/17/21 0443 12/17/21 1309  BP: 102/65  114/68 108/64 (!) 119/58  Pulse: 85 81 81 76  Resp: Temp: 98.2 F (36.8 C) 98.1 F (36.7 C) 98.6 F (37 C) 98.6 F (37 C)  TempSrc: Oral Oral Oral Oral  SpO2: 99% 100% 98% 99%  Weight: 75.3 kg       Intake/Output Summary (Last 24 hours) at 12/17/2021 1334 Last data filed at 12/17/2021 0649 Gross per 24 hour  Intake 1837.13 ml  Output --  Net 1837.13 ml   Filed Weights   12/16/21 2112  Weight: 75.3 kg    Examination:  General exam: Appears comfortable, not in any acute distress.  Chronically ill looking Respiratory system: Decreased breath sounds bilaterally, no wheezing, no crackles. Cardiovascular system: S1-S2 heard, regular rate and rhythm, no murmur. Gastrointestinal system: Abdomen is soft, nontender, nondistended, BS+, PEG tube noted Central nervous system: Alert and oriented x 1. No focal neurological deficits. Extremities: No edema, no cyanosis, no clubbing. Skin: No rashes, lesions or ulcers Psychiatry:  Mood & affect appropriate.     Data Reviewed: I have personally reviewed following labs and imaging studies  CBC: Recent Labs  Lab 12/13/21 2157 12/16/21 0535 12/16/21 1247 12/17/21 0519  WBC 9.8 9.7 11.0* 10.9*  NEUTROABS 6.9 7.4  --   --   HGB 11.3* 10.4* 10.0* 8.6*  HCT 36.6* 32.7* 32.4* 27.9*  MCV 86.1 84.7 85.9 85.8  PLT 537* 409* 388 339   Basic Metabolic Panel: Recent Labs  Lab 12/13/21 2157 12/16/21 0535 12/16/21 1247 12/17/21 0519  NA 136 137  --  141  K 4.2 4.6  --  4.3  CL 104 104  --  110  CO2 24 25  --  27  GLUCOSE 129* 185*  --  96  BUN 28* 74*  --  31*  CREATININE 0.94 3.26* 1.72* 0.59*  CALCIUM 8.3* 8.0*  --  8.1*   GFR: Estimated Creatinine Clearance: 73.2 mL/min (A) (by C-G formula based on SCr of 0.59 mg/dL (L)). Liver Function Tests: Recent Labs  Lab 12/16/21 0535 12/17/21 0519  AST 25 39  ALT 33 43  ALKPHOS 67 60  BILITOT 0.4 0.6  PROT 6.5 5.2*  ALBUMIN 2.1* 1.7*   No results for input(s):  LIPASE, AMYLASE in the last 168 hours. No results for input(s): AMMONIA in the last 168 hours. Coagulation Profile: Recent Labs  Lab 12/16/21 0535 12/17/21 0519  INR 1.2 1.3*   Cardiac Enzymes: No results for input(s): CKTOTAL, CKMB, CKMBINDEX, TROPONINI in the last 168 hours. BNP (last 3 results) No results for input(s): PROBNP in the last 8760 hours. HbA1C: No results for input(s): HGBA1C in the last 72 hours. CBG: No results for input(s): GLUCAP in the last 168 hours. Lipid Profile: No results for input(s): CHOL, HDL, LDLCALC, TRIG, CHOLHDL, LDLDIRECT in the last 72 hours. Thyroid Function Tests: No results for input(s): TSH, T4TOTAL, FREET4, T3FREE, THYROIDAB in the last 72 hours. Anemia Panel: No results for input(s): VITAMINB12, FOLATE, FERRITIN, TIBC, IRON, RETICCTPCT in the last 72 hours. Sepsis Labs: Recent Labs  Lab 12/16/21 0535 12/16/21 0811 12/17/21 0519  PROCALCITON  --   --  0.30  LATICACIDVEN 1.3 1.6  --     Recent Results (from the past 240 hour(s))  Resp Panel by RT-PCR (Flu A&B, Covid) Nasopharyngeal Swab     Status: None   Collection Time: 12/14/21  7:13 AM   Specimen: Nasopharyngeal Swab; Nasopharyngeal(NP) swabs in vial transport medium  Result Value Ref Range Status   SARS Coronavirus 2 by RT PCR NEGATIVE NEGATIVE Final    Comment: (NOTE) SARS-CoV-2 target nucleic acids are NOT DETECTED.  The SARS-CoV-2 RNA is generally detectable in upper respiratory specimens during the acute phase of infection. The lowest concentration of SARS-CoV-2 viral copies this assay can detect is 138 copies/mL. A negative result does not preclude SARS-Cov-2 infection and should not be used as the sole basis for treatment or other patient management decisions. A negative result may occur with  improper specimen collection/handling, submission of specimen other than nasopharyngeal swab, presence of viral mutation(s) within the areas targeted by this assay, and  inadequate number of viral copies(<138 copies/mL). A negative result must be combined with clinical observations, patient history, and epidemiological information. The expected result is Negative.  Fact Sheet for Patients:  BloggerCourse.com  Fact Sheet for Healthcare Providers:  SeriousBroker.it  This test is no t yet approved or cleared by the [place] FDA and  has been authorized for detection and/or diagnosis of SARS-CoV-2 by FDA under an Emergency Use Authorization (EUA). This EUA will remain  in effect (meaning this test can be used) for the duration of the COVID-19 declaration under Section 564(b)(1) of the Act, 21 U.S.C.section 360bbb-3(b)(1), unless the authorization is terminated  or revoked sooner.       Influenza A by PCR NEGATIVE NEGATIVE Final   Influenza B by PCR NEGATIVE NEGATIVE Final    Comment: (NOTE) The Xpert Xpress SARS-CoV-2/FLU/RSV plus assay is intended as an aid in the diagnosis of influenza from Nasopharyngeal swab specimens and should not be used as a sole basis for treatment. Nasal washings and aspirates are unacceptable for Xpert Xpress SARS-CoV-2/FLU/RSV testing.  Fact Sheet for Patients: BloggerCourse.com  Fact Sheet for Healthcare Providers: SeriousBroker.it  This test is not yet approved or cleared by the [place] FDA and has been authorized for detection and/or diagnosis of SARS-CoV-2 by FDA under an Emergency Use Authorization (EUA). This EUA will remain in effect (meaning this test can be used) for the duration of the COVID-19 declaration under Section 564(b)(1) of the Act, 21 U.S.C. section 360bbb-3(b)(1), unless the authorization is terminated or revoked.  Performed at [facility], [address] [place], [place] [address]   Culture, blood (Routine x 2)     Status: None (Preliminary result)   Collection  Time: 12/16/21  5:35 AM   Specimen: BLOOD  Result Value Ref Range Status   Specimen Description   Final    BLOOD BLOOD RIGHT FOREARM Performed at [facility], [address], [place], [place] [address]    Special Requests   Final    BOTTLES DRAWN AEROBIC AND ANAEROBIC Blood Culture results may not be optimal due to an inadequate volume of blood received in culture bottles Performed at [facility], [address] [place], [place] [address]    Culture   Final    NO GROWTH < 24 HOURS Performed at [facility], [address] [place], [place] [address]    Report Status PENDING  Incomplete  Resp Panel by RT-PCR (Flu A&B, Covid) Nasopharyngeal Swab     Status: None   Collection Time: 12/16/21  5:35 AM   Specimen: Nasopharyngeal Swab; Nasopharyngeal(NP) swabs in vial transport medium  Result Value Ref Range Status   SARS Coronavirus 2 by RT PCR NEGATIVE NEGATIVE Final    Comment: (NOTE) SARS-CoV-2 target nucleic acids are NOT DETECTED.  The SARS-CoV-2 RNA is generally detectable in upper respiratory specimens during the acute phase of infection. The lowest concentration of SARS-CoV-2 viral copies this assay can detect is 138 copies/mL. A negative result does not preclude SARS-Cov-2 infection and should not be used as the sole basis for treatment or other patient management decisions. A negative result may occur with  improper specimen collection/handling, submission of specimen other than nasopharyngeal swab, presence of viral mutation(s) within the areas targeted by this assay, and inadequate number of viral copies(<138 copies/mL). A negative result must be combined with clinical observations, patient history, and epidemiological information. The expected result is Negative.  Fact Sheet for Patients:  BloggerCourse.com  Fact Sheet for Healthcare Providers:  SeriousBroker.it  This test  is no t yet approved or cleared by the [place] FDA and  has been authorized for detection and/or diagnosis of SARS-CoV-2 by FDA under an Emergency Use Authorization (EUA). This EUA will remain  in effect (meaning this test can be used) for the duration of the COVID-19 declaration under Section 564(b)(1) of the Act, 21 U.S.C.section 360bbb-3(b)(1), unless the authorization is terminated  or revoked sooner.       Influenza A by PCR NEGATIVE NEGATIVE Final   Influenza B by PCR NEGATIVE NEGATIVE Final    Comment: (NOTE) The Xpert Xpress SARS-CoV-2/FLU/RSV plus assay is intended as an aid in the diagnosis of influenza from Nasopharyngeal swab specimens and should not be used as a sole basis for treatment. Nasal washings and aspirates are unacceptable for Xpert Xpress SARS-CoV-2/FLU/RSV testing.  Fact Sheet for Patients: BloggerCourse.com  Fact Sheet for Healthcare Providers: SeriousBroker.it  This test is not yet approved or cleared by the [place] FDA and has been authorized for detection and/or diagnosis of SARS-CoV-2 by FDA under an Emergency Use Authorization (EUA). This EUA will remain in effect (meaning this test can be used) for the duration of the COVID-19 declaration under Section 564(b)(1) of the Act, 21 U.S.C. section 360bbb-3(b)(1), unless the authorization is terminated or revoked.  Performed at [facility], [address], [place], [place] [address]   Culture, blood (Routine x 2)     Status: None (Preliminary result)   Collection Time: 12/16/21  5:40 AM   Specimen: BLOOD  Result Value Ref Range Status   Specimen Description   Final    BLOOD BLOOD RIGHT FOREARM Performed at [facility], [address], [place], [place] [address]    Special Requests   Final    BOTTLES DRAWN AEROBIC AND ANAEROBIC Blood Culture results may not be optimal due to an inadequate volume of  blood received in culture bottles Performed at [facility], [address], [place], [place] [address]    Culture   Final    NO GROWTH < 24 HOURS Performed at [facility], [address], [place], [place] [address]    Report Status PENDING  Incomplete  MRSA Next Gen by PCR, Nasal     Status: None   Collection Time: 12/16/21 12:47 PM   Specimen: Nasal Mucosa; Nasal Swab  Result Value  Ref Range Status   MRSA by PCR Next Gen NOT DETECTED NOT DETECTED Final    Comment: (NOTE) The GeneXpert MRSA Assay (FDA approved for NASAL specimens only), is one component of a comprehensive MRSA colonization surveillance program. It is not intended to diagnose MRSA infection nor to guide or monitor treatment for MRSA infections. Test performance is not FDA approved in patients less than 55 years old. Performed at [facility], [address], [place], [place] [address]    Radiology Studies: US RENAL  Result Date: 12/16/2021 CLINICAL DATA:  Acute kidney injury EXAM: RENAL / URINARY TRACT ULTRASOUND COMPLETE COMPARISON:  CT abdomen 11/23/2021 FINDINGS: Right Kidney: Renal measurements: 12.3 x 5.3 x 5 cm = volume: 169.26 mL. Echogenicity within normal limits. No mass or hydronephrosis visualized. 13 mm calcified mass in the upper pole of the right kidney consistent with a calcified hemorrhagic or proteinaceous cyst. No further evaluation recommended. Left Kidney: Renal measurements: 11.3 x 5.6 x 4.2 cm = volume: 139.82 mL. Echogenicity within normal limits. No mass or hydronephrosis visualized. Bladder: Foley catheter in the bladder.  Bladder is decompressed. Other: None. IMPRESSION: 1. No obstructive uropathy. Electronically Signed   By: Elige Ko M.D.   On: 12/16/2021 13:44   DG Chest Port 1 View  Result Date: 12/16/2021 CLINICAL DATA:  Shortness of breath. EXAM: PORTABLE CHEST 1 VIEW COMPARISON:  12/13/2021 FINDINGS: 0605 hours. Bibasilar atelectasis or  infiltrate noted with small left pleural effusion. Cardiopericardial silhouette is at upper limits of normal for size. The visualized bony structures of the thorax show no acute abnormality. Telemetry leads overlie the chest. IMPRESSION: Bibasilar atelectasis or infiltrate with small left pleural effusion. Electronically Signed   By: Kennith Center M.D.   On: 12/16/2021 06:13    Scheduled Meds:  Chlorhexidine Gluconate Cloth  6 each Topical Daily   heparin  5,000 Units Subcutaneous Q8H   levETIRAcetam  500 mg Per Tube BID   memantine  10 mg Per Tube QHS   valproic acid  500 mg Per Tube QHS   And   valproic acid  1,000 mg Per Tube Daily   Continuous Infusions:  sodium chloride 100 mL/hr at 12/17/21 0941   ampicillin-sulbactam (UNASYN) IV 3 g (12/17/21 1304)     LOS: 1 day    Time spent: 35 mins    Ebonye Reade, MD [facility]   If 7PM-7AM, please contact night-coverage

## 2021-12-17 NOTE — Progress Notes (Signed)
Initial Nutrition Assessment  DOCUMENTATION CODES:  Non-severe (moderate) malnutrition in context of chronic illness  INTERVENTION:  Start Jevity 1.2 @ 70 ml/hr via PEG -45 ml Prosource TF daily -150 ml free water every 6 hours (600 ml) -Providing 2056 kcals, 104g protein and 1955 ml H2O.  NUTRITION DIAGNOSIS:  Moderate Malnutrition related to dysphagia, chronic illness (dementia) as evidenced by moderate fat depletion, moderate muscle depletion.  GOAL:  Patient will meet greater than or equal to 90% of their needs  MONITOR:  TF tolerance, Labs, Weight trends, Skin, I & O's  REASON FOR ASSESSMENT:  Malnutrition Screening Tool    ASSESSMENT:  86 yo male with a PMH of  dementia, seizure disorder, BPH, recurrent aspiration pneumonia was brought from [place] skilled nursing facility for increased somnolence and shortness of breath.  He was also found to be febrile at nursing home. Patient is admitted for sepsis secondary to aspiration pneumonia and started on IV antibiotics.  Patient is a hospice patient followed by [facility]. 12/2 - PEG placement 12/22 - fecal management system placed  Went to visit pt at bedside. Pt did not awaken to touch or voice. No family present at bedside.  Pt recently seen by another RD.  RD to restart prior tube feeding regimen. MD in agreement.  Per Epic, pt has lost 10 lbs (5.7%) in the past 2.5 weeks, which is significant and severe for the time frame.  Of note, pt with moderate generalized, severe BUE and BLE edema.  Medications: reviewed; Keppra BID, NaCl @ 100 ml/hr  Labs: reviewed; CBG 100-141 (H) HbA1c: 5.6% (03/09/2021)  NUTRITION - FOCUSED PHYSICAL EXAM: Flowsheet Row Most Recent Value  Orbital Region Moderate depletion  Upper Arm Region Moderate depletion  Thoracic and Lumbar Region Moderate depletion  Buccal Region Moderate depletion  Temple Region Moderate depletion  Clavicle Bone Region Moderate depletion  Clavicle and  Acromion Bone Region Moderate depletion  Scapular Bone Region Moderate depletion  Dorsal Hand Moderate depletion  Patellar Region Moderate depletion  Anterior Thigh Region Moderate depletion  Posterior Calf Region Moderate depletion  Edema (RD Assessment) Severe  Hair Reviewed  Eyes Unable to assess  Mouth Unable to assess  Skin Reviewed  Nails Reviewed   Diet Order:   Diet Order             Diet NPO time specified  Diet effective now                  EDUCATION NEEDS:  Not appropriate for education at this time  Skin:  Skin Integrity Issues:: DTI, Other (Comment) DTI: bilateral heels Other: skin tears to bilateral pretibial area, L thigh; unstaged pressure injury on sacrum  Last BM:  12/16/21 - Type 7  Height:  Ht Readings from Last 1 Encounters:  12/12/21 6\' 1"  (1.854 m)   Weight:  Wt Readings from Last 1 Encounters:  12/16/21 75.3 kg   BMI:  Body mass index is 21.9 kg/m.  Estimated Nutritional Needs:  Kcal:  2000-2200 Protein:  100-115 grams Fluid:  >2 L  12/18/21, RD, LDN (she/her/hers) Clinical Inpatient Dietitian RD Pager/After-Hours/Weekend Pager # in [place]

## 2021-12-17 NOTE — TOC Initial Note (Signed)
Transition of Care [facility]) - Initial/Assessment Note    Patient Details  Name: [name] [name] MRN: [id number] Date of Birth: 1935/11/09  Transition of Care [facility]) CM/SW Contact:    Golda Acre, RN Phone Number: 12/17/2021, 9:11 AM  Clinical Narrative:                 Patient admitted from [facility] with sepsis hospice patient through [facility].  Transition of Care [facility]) Screening Note   Patient Details  Name: [name] [name] Date of Birth: 12-20-1935   Transition of Care [facility]) CM/SW Contact:    Golda Acre, RN Phone Number: 12/17/2021, 9:12 AM    Transition of Care Department [facility]) has reviewed patient and no TOC needs have been identified at this time. We will continue to monitor patient advancement through interdisciplinary progression rounds. If new patient transition needs arise, please place a TOC consult.    Expected Discharge Plan: Skilled Nursing Facility ([facility]) Barriers to Discharge: Continued Medical Work up   Patient Goals and CMS Choice Patient states their goals for this hospitalization and ongoing recovery are:: ams CMS Medicare.gov Compare Post Acute Care list provided to:: Patient Represenative (must comment) (wife) Choice offered to / list presented to : Spouse  Expected Discharge Plan and Services Expected Discharge Plan: Skilled Nursing Facility [facility])   Discharge Planning Services: CM Consult Post Acute Care Choice: Skilled Nursing Facility Living arrangements for the past 2 months: Single Family Home                                      Prior Living Arrangements/Services Living arrangements for the past 2 months: Single Family Home Lives with:: Facility Resident Patient language and need for interpreter reviewed:: Yes Do you feel safe going back to the place where you live?: Yes            Criminal Activity/Legal Involvement Pertinent to Current Situation/Hospitalization: No - Comment as  needed  Activities of Daily Living Home Assistive Devices/Equipment: Other (Comment) (tube feeding supplies per notes, pt unable to name any equipment) ADL Screening (condition at time of admission) Patient's cognitive ability adequate to safely complete daily activities?: No Is the patient deaf or have difficulty hearing?: No Does the patient have difficulty seeing, even when wearing glasses/contacts?: No Does the patient have difficulty concentrating, remembering, or making decisions?: Yes Patient able to express need for assistance with ADLs?: No Does the patient have difficulty dressing or bathing?: Yes Independently performs ADLs?: No Dressing (OT): Dependent Grooming: Dependent Feeding: Dependent Bathing: Dependent Toileting: Dependent In/Out Bed: Dependent Walks in Home: Dependent Does the patient have difficulty walking or climbing stairs?: Yes Weakness of Legs: Both Weakness of Arms/Hands: Both  Permission Sought/Granted                  Emotional Assessment Appearance:: Appears stated age     Orientation: : Fluctuating Orientation (Suspected and/or reported Sundowners)   Psych Involvement: No (comment)  Admission diagnosis:  AKI (acute kidney injury) (HCC) [N17.9] Sepsis (HCC) [A41.9] Sepsis with acute renal failure without septic shock, due to unspecified organism, unspecified acute renal failure type (HCC) [A41.9, R65.20, N17.9] Patient Active Problem List   Diagnosis Date Noted   Sepsis (HCC) 12/16/2021   Malnutrition of moderate degree 12/10/2021   Multifocal pneumonia 12/06/2021   Acute metabolic encephalopathy 11/19/2021   Acute encephalopathy 11/18/2021   Recurrent pneumonia 10/20/2021  Pressure injury of skin 05/11/2021   Pericardial effusion, acute    COVID    Palliative care by specialist    Goals of care, counseling/discussion    General weakness    Syncope 05/07/2021   Seizure (HCC) 03/09/2021   Dementia (HCC) 02/25/2021   BPH with  urinary obstruction 12/22/2020   Ankle edema, bilateral 07/13/2020   Hallucinations 02/09/2020   Abdominal pain, acute 08/21/2014   GI bleeding 05/26/2012   Renal mass 05/26/2012   Diverticulosis of colon with hemorrhage 05/26/2012   SHOULDER PAIN, BILATERAL 07/30/2010   HERPES SIMPLEX INFECTION 10/10/2007   TACHYCARDIA, PAROXYSMAL NOS 10/10/2007   Dyslipidemia 07/11/2007   Essential hypertension 07/11/2007   ALLERGIC RHINITIS 07/11/2007   PCP:  [name], MD Pharmacy:   [facility] [place], [place] - [address] [place] [address] Phone: [phone] Fax: [phone]     Social Determinants of Health (SDOH) Interventions    Readmission Risk Interventions Readmission Risk Prevention Plan 10/22/2021  Transportation Screening Complete  Medication Review (RN Care Manager) Referral to Pharmacy  PCP or Specialist appointment within 3-5 days of discharge Complete  HRI or Home Care Consult Not Complete  HRI or Home Care Consult Pt Refusal Comments from SNF  SW Recovery Care/Counseling Consult Complete  Palliative Care Screening Not Applicable  Skilled Nursing Facility Complete  Some recent data might be hidden

## 2021-12-18 DIAGNOSIS — Z515 Encounter for palliative care: Secondary | ICD-10-CM | POA: Diagnosis not present

## 2021-12-18 DIAGNOSIS — A419 Sepsis, unspecified organism: Secondary | ICD-10-CM | POA: Diagnosis not present

## 2021-12-18 DIAGNOSIS — R6520 Severe sepsis without septic shock: Secondary | ICD-10-CM

## 2021-12-18 DIAGNOSIS — N179 Acute kidney failure, unspecified: Secondary | ICD-10-CM

## 2021-12-18 DIAGNOSIS — Z7189 Other specified counseling: Secondary | ICD-10-CM | POA: Diagnosis not present

## 2021-12-18 LAB — GLUCOSE, CAPILLARY
Glucose-Capillary: 104 mg/dL — ABNORMAL HIGH (ref 70–99)
Glucose-Capillary: 112 mg/dL — ABNORMAL HIGH (ref 70–99)
Glucose-Capillary: 117 mg/dL — ABNORMAL HIGH (ref 70–99)
Glucose-Capillary: 122 mg/dL — ABNORMAL HIGH (ref 70–99)
Glucose-Capillary: 124 mg/dL — ABNORMAL HIGH (ref 70–99)
Glucose-Capillary: 135 mg/dL — ABNORMAL HIGH (ref 70–99)

## 2021-12-18 LAB — BASIC METABOLIC PANEL
Anion gap: 6 (ref 5–15)
BUN: 24 mg/dL — ABNORMAL HIGH (ref 8–23)
CO2: 28 mmol/L (ref 22–32)
Calcium: 8.2 mg/dL — ABNORMAL LOW (ref 8.9–10.3)
Chloride: 113 mmol/L — ABNORMAL HIGH (ref 98–111)
Creatinine, Ser: 0.5 mg/dL — ABNORMAL LOW (ref 0.61–1.24)
GFR, Estimated: >60 mL/min (ref >60)
Glucose, Bld: 116 mg/dL — ABNORMAL HIGH (ref 70–99)
Potassium: 4 mmol/L (ref 3.5–5.1)
Sodium: 147 mmol/L — ABNORMAL HIGH (ref 135–145)

## 2021-12-18 LAB — CBC
HCT: 28.7 % — ABNORMAL LOW (ref 39.0–52.0)
Hemoglobin: 8.8 g/dL — ABNORMAL LOW (ref 13.0–17.0)
MCH: 26.7 pg (ref 26.0–34.0)
MCHC: 30.7 g/dL (ref 30.0–36.0)
MCV: 87 fL (ref 80.0–100.0)
Platelets: 380 10*3/uL (ref 150–400)
RBC: 3.3 MIL/uL — ABNORMAL LOW (ref 4.22–5.81)
RDW: 14.9 % (ref 11.5–15.5)
WBC: 9.1 10*3/uL (ref 4.0–10.5)
nRBC: 0 % (ref 0.0–0.2)

## 2021-12-18 MED ORDER — DEXTROSE 5 % IV SOLN
INTRAVENOUS | Status: DC
Start: 2021-12-18 — End: 2021-12-20

## 2021-12-18 NOTE — Progress Notes (Signed)
PROGRESS NOTE    [name] [name]  QQV:[id number] DOB: [birth year] DOA: 12/16/2021  PCP: [name], MD   Brief Narrative:  This 86 years old male with PMH significant for dementia, seizure disorder, BPH, recurrent aspiration pneumonia was brought from [place] skilled nursing facility for increased somnolence and shortness of breath.  History is obtained from chart review since patient is somnolent and has history of dementia.  He was also found to be febrile at nursing home. Was hypoxic initially placed on nonrebreather but then weaned down to room air. Patient is admitted for sepsis secondary to aspiration pneumonia and started on IV antibiotics.  Patient is a hospice patient followed [facility].  Assessment & Plan:   Principal Problem:   Sepsis (HCC)   Sepsis secondary to aspiration pneumonia: Patient presented with SIRS criteria, Chest x-ray shows possible infiltrate. Continue broad-spectrum antibiotics.  Continue DuoNeb nebulization. Continue IV hydration, blood cultures no growth to date. Lactic acid 1.3, procalcitonin 2.0, no leukocytosis.  MRSA nares negative. Antibiotic de-escalated to Unasyn.  Vancomycin discontinued. Sepsis physiology is improving.  Acute kidney injury: > Resolved. Suspect prerenal due to decreased p.o. intake Continue IV hydration, renal functions back to normal.  Acute on chronic metabolic encephalopathy: This could be secondary to aspiration pneumonia. Patient seems back to baseline mental status now. Continue dementia medication.  Dysphagia: Patient has PEG tube in place.  Resume tube feedings.  Seizure disorder: Continue seizure medication through PEG tube  Sacral decubitus POA: Wound care consult.  Goals of care discussion: Family is in the middle of conversation with hospice team for possible outpatient hospice.  It sounds like it was about to be arranged.  Family wants to continue antibiotics for pneumonia.  Family will  make decision about making him comfort care during this admission,  decision has to be made.  Palliative care is consulted.  DVT prophylaxis: Heparin Code Status: DNR Family Communication: No family at bedside Disposition Plan:   Status is: Inpatient  Remains inpatient appropriate because: Admitted for aspiration pneumonia.  Requiring IV antibiotics  Patient may return back to skilled nursing facility in 1 to 2 days.  Consultants:  None  Procedures: None   Antimicrobials:   Anti-infectives (From admission, onward)    Start     Dose/Rate Route Frequency Ordered Stop   12/17/21 1200  Ampicillin-Sulbactam (UNASYN) 3 g in sodium chloride 0.9 % 100 mL IVPB        3 g 200 mL/hr over 30 Minutes Intravenous Every 6 hours 12/17/21 1046     12/17/21 0700  ceFEPIme (MAXIPIME) 2 g in sodium chloride 0.9 % 100 mL IVPB  Status:  Discontinued        2 g 200 mL/hr over 30 Minutes Intravenous Every 24 hours 12/16/21 1216 12/17/21 1046   12/16/21 1222  vancomycin variable dose per unstable renal function (pharmacist dosing)  Status:  Discontinued         Does not apply See admin instructions 12/16/21 1222 12/17/21 1045   12/16/21 0630  ceFEPIme (MAXIPIME) 2 g in sodium chloride 0.9 % 100 mL IVPB        2 g 200 mL/hr over 30 Minutes Intravenous  Once 12/16/21 0616 12/16/21 0716   12/16/21 0630  metroNIDAZOLE (FLAGYL) IVPB 500 mg        500 mg 100 mL/hr over 60 Minutes Intravenous  Once 12/16/21 0616 12/16/21 0911   12/16/21 0630  vancomycin (VANCOCIN) IVPB 1000 mg/200 mL premix  1,000 mg 200 mL/hr over 60 Minutes Intravenous  Once 12/16/21 7564 12/16/21 0910       Subjective: Patient was seen and examined at bedside.  Overnight events noted.  Patient seems like back to his baseline mental status.  Patient is following partial commands. Appears chronically ill looking.  Objective: Vitals:   12/17/21 1309 12/18/21 0439 12/18/21 0500 12/18/21 1158  BP: (!) 119/58 122/75  (!) 146/76   Pulse: 76 76  80  Resp: 20 18  19   Temp: 98.6 F (37 C) 99 F (37.2 C)  99.6 F (37.6 C)  TempSrc: Oral Oral  Oral  SpO2: 99% 98%  98%  Weight:   77.2 kg    No intake or output data in the 24 hours ending 12/18/21 1322  Filed Weights   12/16/21 2112 12/18/21 0500  Weight: 75.3 kg 77.2 kg    Examination:  General exam: Appears chronically ill looking, not in any acute distress.  Lying comfortably. Respiratory system: Decreased breath sounds bilaterally, no wheezing, no crackles. Cardiovascular system: S1-S2 heard, regular rate and rhythm, no murmur. Gastrointestinal system: Abdomen is soft, nontender, nondistended, BS+, PEG tube noted Central nervous system: Alert and oriented x 1. No focal neurological deficits. Extremities: No edema, no cyanosis, no clubbing. Skin: No rashes, lesions or ulcers Psychiatry:  Mood & affect appropriate.     Data Reviewed: I have personally reviewed following labs and imaging studies  CBC: Recent Labs  Lab 12/13/21 2157 12/16/21 0535 12/16/21 1247 12/17/21 0519 12/18/21 0506  WBC 9.8 9.7 11.0* 10.9* 9.1  NEUTROABS 6.9 7.4  --   --   --   HGB 11.3* 10.4* 10.0* 8.6* 8.8*  HCT 36.6* 32.7* 32.4* 27.9* 28.7*  MCV 86.1 84.7 85.9 85.8 87.0  PLT 537* 409* 388 339 380   Basic Metabolic Panel: Recent Labs  Lab 12/13/21 2157 12/16/21 0535 12/16/21 1247 12/17/21 0519 12/18/21 0506  NA 136 137  --  141 147*  K 4.2 4.6  --  4.3 4.0  CL 104 104  --  110 113*  CO2 24 25  --  27 28  GLUCOSE 129* 185*  --  96 116*  BUN 28* 74*  --  31* 24*  CREATININE 0.94 3.26* 1.72* 0.59* 0.50*  CALCIUM 8.3* 8.0*  --  8.1* 8.2*   GFR: Estimated Creatinine Clearance: 75.1 mL/min (A) (by C-G formula based on SCr of 0.5 mg/dL (L)). Liver Function Tests: Recent Labs  Lab 12/16/21 0535 12/17/21 0519  AST 25 39  ALT 33 43  ALKPHOS 67 60  BILITOT 0.4 0.6  PROT 6.5 5.2*  ALBUMIN 2.1* 1.7*   No results for input(s): LIPASE, AMYLASE in the last 168  hours. No results for input(s): AMMONIA in the last 168 hours. Coagulation Profile: Recent Labs  Lab 12/16/21 0535 12/17/21 0519  INR 1.2 1.3*   Cardiac Enzymes: No results for input(s): CKTOTAL, CKMB, CKMBINDEX, TROPONINI in the last 168 hours. BNP (last 3 results) No results for input(s): PROBNP in the last 8760 hours. HbA1C: No results for input(s): HGBA1C in the last 72 hours. CBG: Recent Labs  Lab 12/17/21 1730 12/18/21 0024 12/18/21 0426 12/18/21 0723 12/18/21 1155  GLUCAP 92 104* 122* 112* 117*   Lipid Profile: No results for input(s): CHOL, HDL, LDLCALC, TRIG, CHOLHDL, LDLDIRECT in the last 72 hours. Thyroid Function Tests: No results for input(s): TSH, T4TOTAL, FREET4, T3FREE, THYROIDAB in the last 72 hours. Anemia Panel: No results for input(s): VITAMINB12, FOLATE, FERRITIN, TIBC,  IRON, RETICCTPCT in the last 72 hours. Sepsis Labs: Recent Labs  Lab 12/16/21 0535 12/16/21 0811 12/17/21 0519  PROCALCITON  --   --  0.30  LATICACIDVEN 1.3 1.6  --     Recent Results (from the past 240 hour(s))  Resp Panel by RT-PCR (Flu A&B, Covid) Nasopharyngeal Swab     Status: None   Collection Time: 12/14/21  7:13 AM   Specimen: Nasopharyngeal Swab; Nasopharyngeal(NP) swabs in vial transport medium  Result Value Ref Range Status   SARS Coronavirus 2 by RT PCR NEGATIVE NEGATIVE Final    Comment: (NOTE) SARS-CoV-2 target nucleic acids are NOT DETECTED.  The SARS-CoV-2 RNA is generally detectable in upper respiratory specimens during the acute phase of infection. The lowest concentration of SARS-CoV-2 viral copies this assay can detect is 138 copies/mL. A negative result does not preclude SARS-Cov-2 infection and should not be used as the sole basis for treatment or other patient management decisions. A negative result may occur with  improper specimen collection/handling, submission of specimen other than nasopharyngeal swab, presence of viral mutation(s) within  the areas targeted by this assay, and inadequate number of viral copies(<138 copies/mL). A negative result must be combined with clinical observations, patient history, and epidemiological information. The expected result is Negative.  Fact Sheet for Patients:  BloggerCourse.com  Fact Sheet for Healthcare Providers:  SeriousBroker.it  This test is no t yet approved or cleared by the [place] FDA and  has been authorized for detection and/or diagnosis of SARS-CoV-2 by FDA under an Emergency Use Authorization (EUA). This EUA will remain  in effect (meaning this test can be used) for the duration of the COVID-19 declaration under Section 564(b)(1) of the Act, 21 U.S.C.section 360bbb-3(b)(1), unless the authorization is terminated  or revoked sooner.       Influenza A by PCR NEGATIVE NEGATIVE Final   Influenza B by PCR NEGATIVE NEGATIVE Final    Comment: (NOTE) The Xpert Xpress SARS-CoV-2/FLU/RSV plus assay is intended as an aid in the diagnosis of influenza from Nasopharyngeal swab specimens and should not be used as a sole basis for treatment. Nasal washings and aspirates are unacceptable for Xpert Xpress SARS-CoV-2/FLU/RSV testing.  Fact Sheet for Patients: BloggerCourse.com  Fact Sheet for Healthcare Providers: SeriousBroker.it  This test is not yet approved or cleared by the [place] FDA and has been authorized for detection and/or diagnosis of SARS-CoV-2 by FDA under an Emergency Use Authorization (EUA). This EUA will remain in effect (meaning this test can be used) for the duration of the COVID-19 declaration under Section 564(b)(1) of the Act, 21 U.S.C. section 360bbb-3(b)(1), unless the authorization is terminated or revoked.  Performed at [facility], [address] [place], [place] [address]   Culture, blood (Routine x 2)     Status:  None (Preliminary result)   Collection Time: 12/16/21  5:35 AM   Specimen: BLOOD  Result Value Ref Range Status   Specimen Description   Final    BLOOD BLOOD RIGHT FOREARM Performed at [facility], [address] [place], [place] [address]    Special Requests   Final    BOTTLES DRAWN AEROBIC AND ANAEROBIC Blood Culture results may not be optimal due to an inadequate volume of blood received in culture bottles Performed at [facility], [address], [place], [place] [address]    Culture   Final    NO GROWTH 2 DAYS Performed at [facility], [address] [place], [place] [address]    Report Status PENDING  Incomplete  Resp Panel by RT-PCR (Flu A&B, Covid) Nasopharyngeal Swab     Status: None   Collection Time: 12/16/21  5:35 AM   Specimen: Nasopharyngeal Swab; Nasopharyngeal(NP) swabs in vial transport medium  Result Value Ref Range Status   SARS Coronavirus 2 by RT PCR NEGATIVE NEGATIVE Final    Comment: (NOTE) SARS-CoV-2 target nucleic acids are NOT DETECTED.  The SARS-CoV-2 RNA is generally detectable in upper respiratory specimens during the acute phase of infection. The lowest concentration of SARS-CoV-2 viral copies this assay can detect is 138 copies/mL. A negative result does not preclude SARS-Cov-2 infection and should not be used as the sole basis for treatment or other patient management decisions. A negative result may occur with  improper specimen collection/handling, submission of specimen other than nasopharyngeal swab, presence of viral mutation(s) within the areas targeted by this assay, and inadequate number of viral copies(<138 copies/mL). A negative result must be combined with clinical observations, patient history, and epidemiological information. The expected result is Negative.  Fact Sheet for Patients:  BloggerCourse.com  Fact Sheet for Healthcare Providers:   SeriousBroker.it  This test is no t yet approved or cleared by the [place] FDA and  has been authorized for detection and/or diagnosis of SARS-CoV-2 by FDA under an Emergency Use Authorization (EUA). This EUA will remain  in effect (meaning this test can be used) for the duration of the COVID-19 declaration under Section 564(b)(1) of the Act, 21 U.S.C.section 360bbb-3(b)(1), unless the authorization is terminated  or revoked sooner.       Influenza A by PCR NEGATIVE NEGATIVE Final   Influenza B by PCR NEGATIVE NEGATIVE Final    Comment: (NOTE) The Xpert Xpress SARS-CoV-2/FLU/RSV plus assay is intended as an aid in the diagnosis of influenza from Nasopharyngeal swab specimens and should not be used as a sole basis for treatment. Nasal washings and aspirates are unacceptable for Xpert Xpress SARS-CoV-2/FLU/RSV testing.  Fact Sheet for Patients: BloggerCourse.com  Fact Sheet for Healthcare Providers: SeriousBroker.it  This test is not yet approved or cleared by the [place] FDA and has been authorized for detection and/or diagnosis of SARS-CoV-2 by FDA under an Emergency Use Authorization (EUA). This EUA will remain in effect (meaning this test can be used) for the duration of the COVID-19 declaration under Section 564(b)(1) of the Act, 21 U.S.C. section 360bbb-3(b)(1), unless the authorization is terminated or revoked.  Performed at [facility], [address] [place], [place] [address]   Culture, blood (Routine x 2)     Status: None (Preliminary result)   Collection Time: 12/16/21  5:40 AM   Specimen: BLOOD  Result Value Ref Range Status   Specimen Description   Final    BLOOD BLOOD RIGHT FOREARM Performed at [facility], [address] [place], [place] [address]    Special Requests   Final    BOTTLES DRAWN AEROBIC AND ANAEROBIC Blood Culture  results may not be optimal due to an inadequate volume of blood received in culture bottles Performed at [facility], [address] [place], [place] [address]    Culture   Final    NO GROWTH 2 DAYS Performed at [facility], [address] [place], [place] [address]    Report Status PENDING  Incomplete  MRSA Next Gen by PCR, Nasal     Status: None   Collection Time: 12/16/21 12:47  PM   Specimen: Nasal Mucosa; Nasal Swab  Result Value Ref Range Status   MRSA by PCR Next Gen NOT DETECTED NOT DETECTED Final    Comment: (NOTE) The GeneXpert MRSA Assay (FDA approved for NASAL specimens only), is one component of a comprehensive MRSA colonization surveillance program. It is not intended to diagnose MRSA infection nor to guide or monitor treatment for MRSA infections. Test performance is not FDA approved in patients less than 42 years old. Performed at [facility], [address], [place], [place] [address]    Radiology Studies: US RENAL  Result Date: 12/16/2021 CLINICAL DATA:  Acute kidney injury EXAM: RENAL / URINARY TRACT ULTRASOUND COMPLETE COMPARISON:  CT abdomen 11/23/2021 FINDINGS: Right Kidney: Renal measurements: 12.3 x 5.3 x 5 cm = volume: 169.26 mL. Echogenicity within normal limits. No mass or hydronephrosis visualized. 13 mm calcified mass in the upper pole of the right kidney consistent with a calcified hemorrhagic or proteinaceous cyst. No further evaluation recommended. Left Kidney: Renal measurements: 11.3 x 5.6 x 4.2 cm = volume: 139.82 mL. Echogenicity within normal limits. No mass or hydronephrosis visualized. Bladder: Foley catheter in the bladder.  Bladder is decompressed. Other: None. IMPRESSION: 1. No obstructive uropathy. Electronically Signed   By: Elige Ko M.D.   On: 12/16/2021 13:44    Scheduled Meds:  Chlorhexidine Gluconate Cloth  6 each Topical Daily   feeding supplement (PROSource TF)  45 mL Per Tube Daily    free water  150 mL Per Tube Q6H   heparin  5,000 Units Subcutaneous Q8H   levETIRAcetam  500 mg Per Tube BID   memantine  10 mg Per Tube QHS   valproic acid  500 mg Per Tube QHS   And   valproic acid  1,000 mg Per Tube Daily   Continuous Infusions:  ampicillin-sulbactam (UNASYN) IV 3 g (12/18/21 1005)   dextrose 50 mL/hr at 12/18/21 1137   feeding supplement (JEVITY 1.2 CAL) 1,000 mL (12/17/21 1714)     LOS: 2 days    Time spent: 25 mins    Abbigale Mcelhaney, MD [facility]   If 7PM-7AM, please contact night-coverage

## 2021-12-18 NOTE — Progress Notes (Signed)
Daily Progress Note   Patient Name: [name] [name]       Date: 12/18/2021 DOB: [birth year]  Age: 86 y.o. MRN#: [id number] Attending Physician: Cipriano Bunker, MD Primary Care Physician: [name], MD Admit Date: 12/16/2021  Reason for Consultation/Follow-up: Establishing goals of care  Subjective:  Resting in bed, essentially at baseline, seen alongside his bedside RN. No family at bedside.   Length of Stay: 2  Current Medications: Scheduled Meds:   Chlorhexidine Gluconate Cloth  6 each Topical Daily   feeding supplement (PROSource TF)  45 mL Per Tube Daily   free water  150 mL Per Tube Q6H   heparin  5,000 Units Subcutaneous Q8H   levETIRAcetam  500 mg Per Tube BID   memantine  10 mg Per Tube QHS   valproic acid  500 mg Per Tube QHS   And   valproic acid  1,000 mg Per Tube Daily    Continuous Infusions:  ampicillin-sulbactam (UNASYN) IV 3 g (12/18/21 1005)   dextrose 50 mL/hr at 12/18/21 1137   feeding supplement (JEVITY 1.2 CAL) 1,000 mL (12/17/21 1714)    PRN Meds: acetaminophen **OR** acetaminophen, albuterol Assessment/ Physical Exam         Has edema both UE Has PEG Tube feeds ongoing IVF to be changed to D5W due to high Na levels. Regular work of breathing On antibiotics  Vital Signs: BP (!) 146/76 (BP Location: Right Leg)    Pulse 80    Temp 99.6 F (37.6 C) (Oral)    Resp 19    Wt 77.2 kg    SpO2 98%    BMI 22.45 kg/m  SpO2: SpO2: 98 % O2 Device: O2 Device: Nasal Cannula O2 Flow Rate: O2 Flow Rate (L/min): 4 L/min  Intake/output summary:  Intake/Output Summary (Last 24 hours) at 12/18/2021 1424 Last data filed at 12/18/2021 0905 Gross per 24 hour  Intake 60 ml  Output --  Net 60 ml   LBM: Last BM Date: 12/16/21 Baseline Weight:  Weight: 75.3 kg Most recent weight: Weight: 77.2 kg       Palliative Assessment/Data:      Patient Active Problem List   Diagnosis Date Noted   Sepsis (HCC) 12/16/2021   Malnutrition of moderate degree 12/10/2021   Multifocal pneumonia 12/06/2021   Acute metabolic encephalopathy  11/19/2021   Acute encephalopathy 11/18/2021   Recurrent pneumonia 10/20/2021   Pressure injury of skin 05/11/2021   Pericardial effusion, acute    COVID    Palliative care by specialist    Goals of care, counseling/discussion    General weakness    Syncope 05/07/2021   Seizure (HCC) 03/09/2021   Dementia (HCC) 02/25/2021   BPH with urinary obstruction 12/22/2020   Ankle edema, bilateral 07/13/2020   Hallucinations 02/09/2020   Abdominal pain, acute 08/21/2014   GI bleeding 05/26/2012   Renal mass 05/26/2012   Diverticulosis of colon with hemorrhage 05/26/2012   SHOULDER PAIN, BILATERAL 07/30/2010   HERPES SIMPLEX INFECTION 10/10/2007   TACHYCARDIA, PAROXYSMAL NOS 10/10/2007   Dyslipidemia 07/11/2007   Essential hypertension 07/11/2007   ALLERGIC RHINITIS 07/11/2007    Palliative Care Assessment & Plan   Patient Profile:    Assessment: 86 year old gentleman, known to palliative medicine service, seen extensively by my colleague Yong Channel, NP in most recent hospitalization.  Currently enrolled in hospice services at his facility [facility] history of dementia seizure disorders, BPH, recurrent aspiration pneumonias requiring hospitalization. Patient admitted to hospital medicine service for sepsis secondary to aspiration pneumonia.  Patient being followed by hospice nurses.  In a previous hospitalization, extensive goals of care discussions were undertaken between palliative care and patient's family namely wife and son.  At that time, they did not wish to discontinue patient's tube feedings and hence patient was not discharged to a residential hospice  setting.  Patient currently admitted under hospital medicine service with hospice nurses following, remains on antibiotics as per family's goals of wishes. PMT has had several goals of care discussions in recent hospitalization.  Recommendations/Plan: Continue current mode of care Consider PICC line placement, if need IV for antibiotics and also for future use in case more symptom management is needed.  Recommend transfer back to his SNF with continuation of hospice services on discharge.    No new PMT specific recommendations at this time.   Code Status:    Code Status Orders  (From admission, onward)           Start     Ordered   12/16/21 1143  Do not attempt resuscitation (DNR)  Continuous       Question Answer Comment  In the event of cardiac or respiratory ARREST Do not call a code blue   In the event of cardiac or respiratory ARREST Do not perform Intubation, CPR, defibrillation or ACLS   In the event of cardiac or respiratory ARREST Use medication by any route, position, wound care, and other measures to relive pain and suffering. May use oxygen, suction and manual treatment of airway obstruction as needed for comfort.   Comments DNR confirmed w/ son by phone      12/16/21 1142           Code Status History     Date Active Date Inactive Code Status Order ID Comments User Context   12/12/2021 1534 12/15/2021 0145 DNR [id number]  Ulice Bold, NP ED   12/12/2021 1444 12/12/2021 1534 DNR [id number]  Gerhard Munch, MD ED   12/09/2021 1252 12/11/2021 0312 DNR [id number]  Ulice Bold, NP Inpatient   12/08/2021 1518 12/09/2021 1251 DNR [id number]  Ulice Bold, NP Inpatient   12/06/2021 1647 12/08/2021 1518 Partial Code [id number]  Teddy Spike, DO Inpatient   11/18/2021 2358 12/05/2021 2219 Full Code [id number]  Eduard Clos, MD Inpatient   10/21/2021 0236 11/05/2021  2107 Full Code [id number]  Eduard Clos, MD ED   05/07/2021 1920 05/12/2021 1938  Full Code [id number]  Synetta Fail, MD ED   03/09/2021 1821 03/11/2021 2310 Full Code [id number]  Emeline General, MD ED   02/08/2020 1940 02/12/2020 1744 Full Code [id number]  Melene Plan, DO ED   08/21/2014 1956 08/28/2014 2122 Full Code [id number]  Dorothea Ogle, MD Inpatient   08/20/2014 0349 08/20/2014 2002 Full Code [id number]  Eduard Clos, MD Inpatient   05/26/2012 0311 05/28/2012 1503 Full Code [id number]  Eduard Clos, MD Inpatient       Prognosis:  < 6 months  Discharge Planning: Skilled Nursing Facility with Hospice  Care plan was discussed with  nurse  Thank you for allowing the Palliative Medicine Team to assist in the care of this patient.   Time In:  1400 Time Out: 1425 Total Time 25 Prolonged Time Billed No       Greater than 50%  of this time was spent counseling and coordinating care related to the above assessment and plan.  Rosalin Hawking, MD  Please contact Palliative Medicine Team phone at [phone] for questions and concerns.

## 2021-12-18 NOTE — Progress Notes (Signed)
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 Family requested patient be transported to ED from facility due to respiratory distress. Hospice was not notified prior to transport. Patient was admitted to the hospital with diagnosis of sepsis and pneumonia. Per Dr. Renato Gails with [facility], this is a related admission.   Visited patient at bedside. Patient asleep. No family at bedside. Phone call to speak with wife. No answer. Edema to extremities, more severe to bilateral upper extremities. Report exchanged with hospital care team. Unknown discharge plan at this time. Hospital liaison will continue to follow for discharge disposition.   Patient remains inpatient appropriate due to symptom management/treatment. Per MD/Kumar, patient anticipated to discharge before/on Sunday. [facility] will continue to follow    V/S: 99.6, 146/76, 80, 19, 98% on 2L O2 via [place]   I/O: Not documented/825   Abnormal Labs:  Sodium: 147 (H) Chloride: 113 (H) Glucose: 116 (H) BUN: 24 (H) Creatinine: 0.50 (L) Calcium: 8.2 (L) RBC: 3.30 (L) Hemoglobin: 8.8 (L) HCT: 28.7 (L)   Diagnostics: None  IV/PRN: Ampicillin-Sulbactam (UNASYN) in sodium chloride 0.9 % 100 mL IVPB 3 g Every 6 hours IV, dextrose 5 % solution 50 mL/hr Continuous IV   Problem List: Principal Problem:   Sepsis (HCC)   Sepsis secondary to aspiration pneumonia: Patient presented with SIRS criteria, Chest x-ray shows possible infiltrate. Continue broad-spectrum antibiotics.  Continue DuoNeb nebulization. Continue IV hydration, blood cultures no growth to date. Lactic acid 1.3, procalcitonin 2.0, no leukocytosis.  MRSA nares negative. Antibiotic de-escalated to Unasyn.  Vancomycin discontinued. Sepsis physiology is improving.   Acute kidney injury: > Resolved. Suspect prerenal due to decreased p.o.  intake Continue IV hydration, renal functions back to normal.   Acute on chronic metabolic encephalopathy: This could be secondary to aspiration pneumonia. Patient seems back to baseline mental status now. Continue dementia medication.   Dysphagia: Patient has PEG tube in place.  Resume tube feedings.   Seizure disorder: Continue seizure medication through PEG tube   Discharge Planning: Current plan is for patient to return to [place] with hospice services to continue once stable for discharge.   Family Contact: Phone call to update wife. No answer.   IDT: Updated   Goals of Care: Ongoing. Treat the treatable.   Please call with any hospice related questions/concerns.    Thank you,   Celene Squibb, RN, BSN [facility] Liaison  [phone]

## 2021-12-19 DIAGNOSIS — A419 Sepsis, unspecified organism: Secondary | ICD-10-CM | POA: Diagnosis not present

## 2021-12-19 LAB — BASIC METABOLIC PANEL
Anion gap: 6 (ref 5–15)
BUN: 20 mg/dL (ref 8–23)
CO2: 27 mmol/L (ref 22–32)
Calcium: 8.2 mg/dL — ABNORMAL LOW (ref 8.9–10.3)
Chloride: 111 mmol/L (ref 98–111)
Creatinine, Ser: 0.49 mg/dL — ABNORMAL LOW (ref 0.61–1.24)
GFR, Estimated: >60 mL/min (ref >60)
Glucose, Bld: 150 mg/dL — ABNORMAL HIGH (ref 70–99)
Potassium: 5 mmol/L (ref 3.5–5.1)
Sodium: 144 mmol/L (ref 135–145)

## 2021-12-19 LAB — GLUCOSE, CAPILLARY
Glucose-Capillary: 118 mg/dL — ABNORMAL HIGH (ref 70–99)
Glucose-Capillary: 131 mg/dL — ABNORMAL HIGH (ref 70–99)
Glucose-Capillary: 133 mg/dL — ABNORMAL HIGH (ref 70–99)
Glucose-Capillary: 133 mg/dL — ABNORMAL HIGH (ref 70–99)
Glucose-Capillary: 135 mg/dL — ABNORMAL HIGH (ref 70–99)
Glucose-Capillary: 139 mg/dL — ABNORMAL HIGH (ref 70–99)

## 2021-12-19 LAB — CBC
HCT: 31.3 % — ABNORMAL LOW (ref 39.0–52.0)
Hemoglobin: 9.6 g/dL — ABNORMAL LOW (ref 13.0–17.0)
MCH: 26 pg (ref 26.0–34.0)
MCHC: 30.7 g/dL (ref 30.0–36.0)
MCV: 84.8 fL (ref 80.0–100.0)
Platelets: 428 10*3/uL — ABNORMAL HIGH (ref 150–400)
RBC: 3.69 MIL/uL — ABNORMAL LOW (ref 4.22–5.81)
RDW: 14.7 % (ref 11.5–15.5)
WBC: 12.4 10*3/uL — ABNORMAL HIGH (ref 4.0–10.5)
nRBC: 0 % (ref 0.0–0.2)

## 2021-12-19 LAB — PHOSPHORUS: Phosphorus: 3.2 mg/dL (ref 2.5–4.6)

## 2021-12-19 LAB — MAGNESIUM: Magnesium: 2 mg/dL (ref 1.7–2.4)

## 2021-12-19 MED ORDER — ORAL CARE MOUTH RINSE
15.0000 mL | Freq: Two times a day (BID) | OROMUCOSAL | Status: DC
  Administered 2021-12-19 – 2021-12-20 (×3): 15 mL via OROMUCOSAL

## 2021-12-19 MED ORDER — CHLORHEXIDINE GLUCONATE 0.12 % MT SOLN
15.0000 mL | Freq: Two times a day (BID) | OROMUCOSAL | Status: DC
  Administered 2021-12-19 – 2021-12-20 (×3): 15 mL via OROMUCOSAL
  Filled 2021-12-19 (×3): qty 15

## 2021-12-19 MED ORDER — LIP MEDEX EX OINT
TOPICAL_OINTMENT | CUTANEOUS | Status: DC | PRN
  Administered 2021-12-19: 75 via TOPICAL
  Filled 2021-12-19: qty 7

## 2021-12-19 NOTE — NC FL2 (Signed)
[place] LEVEL OF CARE SCREENING TOOL     IDENTIFICATION  Patient Name: [name] [name] Birthdate: [birth year] Sex: male Admission Date (Current Location): 12/16/2021  [facility] and [place] Number:  Herbalist and Address:  [facility],  [address], [place]      Provider Number: [id number]  Attending Physician Name and Address:  Shawna Clamp, MD  Relative Name and Phone Number:  Marshal Keck spouse [phone]    Current Level of Care: Hospital Recommended Level of Care: [place] Prior Approval Number:    Date Approved/Denied:   PASRR Number: [id number]  Discharge Plan: SNF    Current Diagnoses: Patient Active Problem List   Diagnosis Date Noted   AKI (acute kidney injury) ([place])    Sepsis ([place]) 12/16/2021   Malnutrition of moderate degree 12/10/2021   Multifocal pneumonia 99991111   Acute metabolic encephalopathy 99991111   Acute encephalopathy 11/18/2021   Recurrent pneumonia 10/20/2021   Pressure injury of skin 05/11/2021   Pericardial effusion, acute    COVID    Palliative care by specialist    Goals of care, counseling/discussion    General weakness    Syncope 05/07/2021   Seizure ([place]) 03/09/2021   Dementia ([place]) 02/25/2021   BPH with urinary obstruction 12/22/2020   Ankle edema, bilateral 07/13/2020   Hallucinations 02/09/2020   Abdominal pain, acute 08/21/2014   GI bleeding 05/26/2012   Renal mass 05/26/2012   Diverticulosis of colon with hemorrhage 05/26/2012   SHOULDER PAIN, BILATERAL 07/30/2010   HERPES SIMPLEX INFECTION 10/10/2007   TACHYCARDIA, PAROXYSMAL NOS 10/10/2007   Dyslipidemia 07/11/2007   Essential hypertension 07/11/2007   ALLERGIC RHINITIS 07/11/2007    Orientation RESPIRATION BLADDER Height & Weight     Self    Incontinent Weight: 77.2 kg Height:     BEHAVIORAL SYMPTOMS/MOOD NEUROLOGICAL BOWEL NUTRITION STATUS      Incontinent Diet, Feeding  tube  AMBULATORY STATUS COMMUNICATION OF NEEDS Skin   Extensive Assist   PU Stage and Appropriate Care PU Stage 1 Dressing: Daily PU Stage 2 Dressing:  (Sacrum prn)                   Personal Care Assistance Level of Assistance  Bathing, Feeding, Dressing Bathing Assistance: Maximum assistance Feeding assistance: Maximum assistance Dressing Assistance: Maximum assistance     Functional Limitations Info  Sight, Hearing, Speech Sight Info: Adequate Hearing Info: Adequate Speech Info: Impaired (Dysphagia)    SPECIAL CARE FACTORS FREQUENCY                       Contractures Contractures Info: Not present    Additional Factors Info  Code Status, Allergies, Psychotropic Code Status Info:  (Full) Allergies Info:  (Chocolate, Ciprofloxacin, Lipitor) Psychotropic Info:  (Namenda, depakene see MAR)   Isolation Precautions Info: COVID     Current Medications (12/19/2021):  This is the current hospital active medication list Current Facility-Administered Medications  Medication Dose Route Frequency Provider Last Rate Last Admin   acetaminophen (TYLENOL) tablet 650 mg  650 mg Per Tube Q6H PRN Marylyn Ishihara, Tyrone A, DO       Or   acetaminophen (TYLENOL) suppository 650 mg  650 mg Rectal Q6H PRN Marylyn Ishihara, Tyrone A, DO       albuterol (PROVENTIL) (2.5 MG/3ML) 0.083% nebulizer solution 2.5 mg  2.5 mg Nebulization Q2H PRN Kyle, Tyrone A, DO       Ampicillin-Sulbactam (UNASYN) 3  g in sodium chloride 0.9 % 100 mL IVPB  3 g Intravenous Q6H Rexford Maus, RPH 200 mL/hr at 12/19/21 0658 3 g at 12/19/21 0658   chlorhexidine (PERIDEX) 0.12 % solution 15 mL  15 mL Mouth Rinse BID Cipriano Bunker, MD   15 mL at 12/19/21 0948   Chlorhexidine Gluconate Cloth 2 % PADS 6 each  6 each Topical Daily Ronaldo Miyamoto, Tyrone A, DO   6 each at 12/18/21 0957   dextrose 5 % solution   Intravenous Continuous Cipriano Bunker, MD 50 mL/hr at 12/19/21 0945 New Bag at 12/19/21 0945   feeding supplement (JEVITY 1.2 CAL)  liquid 1,000 mL  1,000 mL Per Tube Continuous Cipriano Bunker, MD 70 mL/hr at 12/18/21 1846 30 mL at 12/18/21 1846   feeding supplement (PROSource TF) liquid 45 mL  45 mL Per Tube Daily Cipriano Bunker, MD   45 mL at 12/19/21 0947   free water 150 mL  150 mL Per Tube Q6H Cipriano Bunker, MD   150 mL at 12/19/21 0500   heparin injection 5,000 Units  5,000 Units Subcutaneous Q8H Kyle, Tyrone A, DO   5,000 Units at 12/19/21 0658   levETIRAcetam (KEPPRA) 100 MG/ML solution 500 mg  500 mg Per Tube BID Ronaldo Miyamoto, Tyrone A, DO   500 mg at 12/19/21 0240   lip balm (CARMEX) ointment   Topical PRN Cipriano Bunker, MD       MEDLINE mouth rinse  15 mL Mouth Rinse q12n4p Cipriano Bunker, MD       memantine [facility]) tablet 10 mg  10 mg Per Tube QHS Ronaldo Miyamoto, Tyrone A, DO   10 mg at 12/18/21 2258   valproic acid (DEPAKENE) 250 MG/5ML solution 500 mg  500 mg Per Tube QHS Kyle, Tyrone A, DO   500 mg at 12/18/21 2252   And   valproic acid (DEPAKENE) 250 MG/5ML solution 1,000 mg  1,000 mg Per Tube Daily Kyle, Tyrone A, DO   1,000 mg at 12/19/21 9735     Discharge Medications: Please see discharge summary for a list of discharge medications.  Relevant Imaging Results:  Relevant Lab Results:   Additional Information SS# [id number], [place], [place]

## 2021-12-19 NOTE — Plan of Care (Signed)
  Problem: Coping: Goal: Level of anxiety will decrease Outcome: Progressing   Problem: Pain Managment: Goal: General experience of comfort will improve Outcome: Progressing   Problem: Safety: Goal: Ability to remain free from injury will improve Outcome: Progressing   Problem: Skin Integrity: Goal: Risk for impaired skin integrity will decrease Outcome: Progressing   

## 2021-12-19 NOTE — Progress Notes (Signed)
PROGRESS NOTE    [name] [name]  KNL:[id number] DOB: [birth year] DOA: 12/16/2021  PCP: [name], MD   Brief Narrative:  This 86 years old male with PMH significant for dementia, seizure disorder, BPH, recurrent aspiration pneumonia was brought from [place] skilled nursing facility for increased somnolence and shortness of breath.  History is obtained from chart review since patient is somnolent and has history of dementia.  He was also found to be febrile at nursing home. Was hypoxic initially placed on nonrebreather but then weaned down to room air. Patient is admitted for sepsis secondary to aspiration pneumonia and started on IV antibiotics.  Patient is a hospice patient followed [facility].  Plan is to complete course of antibiotic and return back with hospice services to nursing home.  Assessment & Plan:   Principal Problem:   Sepsis (HCC) Active Problems:   AKI (acute kidney injury) (HCC)   Sepsis secondary to aspiration pneumonia: Patient presented with SIRS criteria, Chest x-ray shows possible infiltrate. Continue broad-spectrum antibiotics.  Continue DuoNeb nebulization. Continue IV hydration, blood cultures no growth to date. Lactic acid 1.3, procalcitonin 2.0, no leukocytosis.  MRSA nares negative. Antibiotic de-escalated to Unasyn.  Vancomycin discontinued. Sepsis physiology is improving.  Acute kidney injury: > Resolved. Suspect prerenal due to decreased p.o. intake Continue IV hydration, renal functions back to normal.  Acute on chronic metabolic encephalopathy: This could be secondary to aspiration pneumonia. Patient seems back to baseline mental status now. Continue dementia medication.  Dysphagia: Patient has PEG tube in place.  Continue tube feedings.  Seizure disorder: Continue seizure medication through PEG tube  Sacral decubitus POA: Wound care consult.  Goals of care discussion: Family is in the middle of conversation with hospice  team for possible outpatient hospice.  It sounds like it was about to be arranged.  Family wants to continue antibiotics for pneumonia.  Family will make decision about making him comfort care during this admission,  decision has to be made.  Palliative care is consulted.  DVT prophylaxis: Heparin Code Status: DNR Family Communication: No family at bedside Disposition Plan:   Status is: Inpatient  Remains inpatient appropriate because: Admitted for aspiration pneumonia.  Requiring IV antibiotics  Patient may return back to skilled nursing facility in 1 to 2 days.  Consultants:  None  Procedures: None   Antimicrobials:   Anti-infectives (From admission, onward)    Start     Dose/Rate Route Frequency Ordered Stop   12/17/21 1200  Ampicillin-Sulbactam (UNASYN) 3 g in sodium chloride 0.9 % 100 mL IVPB        3 g 200 mL/hr over 30 Minutes Intravenous Every 6 hours 12/17/21 1046     12/17/21 0700  ceFEPIme (MAXIPIME) 2 g in sodium chloride 0.9 % 100 mL IVPB  Status:  Discontinued        2 g 200 mL/hr over 30 Minutes Intravenous Every 24 hours 12/16/21 1216 12/17/21 1046   12/16/21 1222  vancomycin variable dose per unstable renal function (pharmacist dosing)  Status:  Discontinued         Does not apply See admin instructions 12/16/21 1222 12/17/21 1045   12/16/21 0630  ceFEPIme (MAXIPIME) 2 g in sodium chloride 0.9 % 100 mL IVPB        2 g 200 mL/hr over 30 Minutes Intravenous  Once 12/16/21 0616 12/16/21 0716   12/16/21 0630  metroNIDAZOLE (FLAGYL) IVPB 500 mg        500 mg 100  mL/hr over 60 Minutes Intravenous  Once 12/16/21 0616 12/16/21 0911   12/16/21 0630  vancomycin (VANCOCIN) IVPB 1000 mg/200 mL premix        1,000 mg 200 mL/hr over 60 Minutes Intravenous  Once 12/16/21 2060 12/16/21 0910       Subjective: Patient was seen and examined at bedside.  Overnight events noted.  Patient is following partial commands, seems like he is at his baseline mental status. Appears  chronically ill looking.  Objective: Vitals:   12/18/21 1158 12/18/21 2023 12/19/21 0411 12/19/21 0500  BP: (!) 146/76 133/63 138/80   Pulse: 80 80 75   Resp: 19 14 16    Temp: 99.6 F (37.6 C) 98.7 F (37.1 C) 98.5 F (36.9 C)   TempSrc: Oral Oral    SpO2: 98% 100% 100%   Weight:    77.2 kg    Intake/Output Summary (Last 24 hours) at 12/19/2021 1247 Last data filed at 12/19/2021 0600 Gross per 24 hour  Intake 2728.56 ml  Output 1350 ml  Net 1378.56 ml    Filed Weights   12/16/21 2112 12/18/21 0500 12/19/21 0500  Weight: 75.3 kg 77.2 kg 77.2 kg    Examination:  General exam: Appears comfortable, not in any acute distress.  Deconditioned, lying comfortably. Respiratory system: Decreased breath sounds bilaterally, no wheezing, no crackles. Cardiovascular system: S1-S2 heard, regular rate and rhythm, no murmur. Gastrointestinal system: Abdomen is soft, nontender, nondistended, BS+, PEG tube noted Central nervous system: Alert and oriented x 1. No focal neurological deficits. Extremities: No edema, no cyanosis, no clubbing. Skin: No rashes, lesions or ulcers Psychiatry:  Mood & affect appropriate.     Data Reviewed: I have personally reviewed following labs and imaging studies  CBC: Recent Labs  Lab 12/13/21 2157 12/16/21 0535 12/16/21 1247 12/17/21 0519 12/18/21 0506 12/19/21 0955  WBC 9.8 9.7 11.0* 10.9* 9.1 12.4*  NEUTROABS 6.9 7.4  --   --   --   --   HGB 11.3* 10.4* 10.0* 8.6* 8.8* 9.6*  HCT 36.6* 32.7* 32.4* 27.9* 28.7* 31.3*  MCV 86.1 84.7 85.9 85.8 87.0 84.8  PLT 537* 409* 388 339 380 428*   Basic Metabolic Panel: Recent Labs  Lab 12/13/21 2157 12/16/21 0535 12/16/21 1247 12/17/21 0519 12/18/21 0506 12/19/21 0542  NA 136 137  --  141 147* 144  K 4.2 4.6  --  4.3 4.0 5.0  CL 104 104  --  110 113* 111  CO2 24 25  --  27 28 27   GLUCOSE 129* 185*  --  96 116* 150*  BUN 28* 74*  --  31* 24* 20  CREATININE 0.94 3.26* 1.72* 0.59* 0.50* 0.49*   CALCIUM 8.3* 8.0*  --  8.1* 8.2* 8.2*  MG  --   --   --   --   --  2.0  PHOS  --   --   --   --   --  3.2   GFR: Estimated Creatinine Clearance: 75.1 mL/min (A) (by C-G formula based on SCr of 0.49 mg/dL (L)). Liver Function Tests: Recent Labs  Lab 12/16/21 0535 12/17/21 0519  AST 25 39  ALT 33 43  ALKPHOS 67 60  BILITOT 0.4 0.6  PROT 6.5 5.2*  ALBUMIN 2.1* 1.7*   No results for input(s): LIPASE, AMYLASE in the last 168 hours. No results for input(s): AMMONIA in the last 168 hours. Coagulation Profile: Recent Labs  Lab 12/16/21 0535 12/17/21 0519  INR 1.2 1.3*  Cardiac Enzymes: No results for input(s): CKTOTAL, CKMB, CKMBINDEX, TROPONINI in the last 168 hours. BNP (last 3 results) No results for input(s): PROBNP in the last 8760 hours. HbA1C: No results for input(s): HGBA1C in the last 72 hours. CBG: Recent Labs  Lab 12/18/21 2018 12/19/21 0017 12/19/21 0407 12/19/21 0738 12/19/21 1059  GLUCAP 135* 133* 133* 135* 139*   Lipid Profile: No results for input(s): CHOL, HDL, LDLCALC, TRIG, CHOLHDL, LDLDIRECT in the last 72 hours. Thyroid Function Tests: No results for input(s): TSH, T4TOTAL, FREET4, T3FREE, THYROIDAB in the last 72 hours. Anemia Panel: No results for input(s): VITAMINB12, FOLATE, FERRITIN, TIBC, IRON, RETICCTPCT in the last 72 hours. Sepsis Labs: Recent Labs  Lab 12/16/21 0535 12/16/21 0811 12/17/21 0519  PROCALCITON  --   --  0.30  LATICACIDVEN 1.3 1.6  --     Recent Results (from the past 240 hour(s))  Resp Panel by RT-PCR (Flu A&B, Covid) Nasopharyngeal Swab     Status: None   Collection Time: 12/14/21  7:13 AM   Specimen: Nasopharyngeal Swab; Nasopharyngeal(NP) swabs in vial transport medium  Result Value Ref Range Status   SARS Coronavirus 2 by RT PCR NEGATIVE NEGATIVE Final    Comment: (NOTE) SARS-CoV-2 target nucleic acids are NOT DETECTED.  The SARS-CoV-2 RNA is generally detectable in upper respiratory specimens during the  acute phase of infection. The lowest concentration of SARS-CoV-2 viral copies this assay can detect is 138 copies/mL. A negative result does not preclude SARS-Cov-2 infection and should not be used as the sole basis for treatment or other patient management decisions. A negative result may occur with  improper specimen collection/handling, submission of specimen other than nasopharyngeal swab, presence of viral mutation(s) within the areas targeted by this assay, and inadequate number of viral copies(<138 copies/mL). A negative result must be combined with clinical observations, patient history, and epidemiological information. The expected result is Negative.  Fact Sheet for Patients:  BloggerCourse.com  Fact Sheet for Healthcare Providers:  SeriousBroker.it  This test is no t yet approved or cleared by the [place] FDA and  has been authorized for detection and/or diagnosis of SARS-CoV-2 by FDA under an Emergency Use Authorization (EUA). This EUA will remain  in effect (meaning this test can be used) for the duration of the COVID-19 declaration under Section 564(b)(1) of the Act, 21 U.S.C.section 360bbb-3(b)(1), unless the authorization is terminated  or revoked sooner.       Influenza A by PCR NEGATIVE NEGATIVE Final   Influenza B by PCR NEGATIVE NEGATIVE Final    Comment: (NOTE) The Xpert Xpress SARS-CoV-2/FLU/RSV plus assay is intended as an aid in the diagnosis of influenza from Nasopharyngeal swab specimens and should not be used as a sole basis for treatment. Nasal washings and aspirates are unacceptable for Xpert Xpress SARS-CoV-2/FLU/RSV testing.  Fact Sheet for Patients: BloggerCourse.com  Fact Sheet for Healthcare Providers: SeriousBroker.it  This test is not yet approved or cleared by the [place] FDA and has been authorized for detection and/or  diagnosis of SARS-CoV-2 by FDA under an Emergency Use Authorization (EUA). This EUA will remain in effect (meaning this test can be used) for the duration of the COVID-19 declaration under Section 564(b)(1) of the Act, 21 U.S.C. section 360bbb-3(b)(1), unless the authorization is terminated or revoked.  Performed at [facility], [address] [place], [place] [address]   Culture, blood (Routine x 2)     Status: None (Preliminary result)   Collection Time: 12/16/21  5:35 AM   Specimen: BLOOD  Result Value Ref Range Status   Specimen Description   Final    BLOOD BLOOD RIGHT FOREARM Performed at [facility], [address], [place], [place] [address]    Special Requests   Final    BOTTLES DRAWN AEROBIC AND ANAEROBIC Blood Culture results may not be optimal due to an inadequate volume of blood received in culture bottles Performed at [facility], [address] [place], [place] [address]    Culture   Final    NO GROWTH 2 DAYS Performed at [facility], [address], [place], [place] [address]    Report Status PENDING  Incomplete  Resp Panel by RT-PCR (Flu A&B, Covid) Nasopharyngeal Swab     Status: None   Collection Time: 12/16/21  5:35 AM   Specimen: Nasopharyngeal Swab; Nasopharyngeal(NP) swabs in vial transport medium  Result Value Ref Range Status   SARS Coronavirus 2 by RT PCR NEGATIVE NEGATIVE Final    Comment: (NOTE) SARS-CoV-2 target nucleic acids are NOT DETECTED.  The SARS-CoV-2 RNA is generally detectable in upper respiratory specimens during the acute phase of infection. The lowest concentration of SARS-CoV-2 viral copies this assay can detect is 138 copies/mL. A negative result does not preclude SARS-Cov-2 infection and should not be used as the sole basis for treatment or other patient management decisions. A negative result may occur with  improper specimen collection/handling, submission of  specimen other than nasopharyngeal swab, presence of viral mutation(s) within the areas targeted by this assay, and inadequate number of viral copies(<138 copies/mL). A negative result must be combined with clinical observations, patient history, and epidemiological information. The expected result is Negative.  Fact Sheet for Patients:  BloggerCourse.com  Fact Sheet for Healthcare Providers:  SeriousBroker.it  This test is no t yet approved or cleared by the [place] FDA and  has been authorized for detection and/or diagnosis of SARS-CoV-2 by FDA under an Emergency Use Authorization (EUA). This EUA will remain  in effect (meaning this test can be used) for the duration of the COVID-19 declaration under Section 564(b)(1) of the Act, 21 U.S.C.section 360bbb-3(b)(1), unless the authorization is terminated  or revoked sooner.       Influenza A by PCR NEGATIVE NEGATIVE Final   Influenza B by PCR NEGATIVE NEGATIVE Final    Comment: (NOTE) The Xpert Xpress SARS-CoV-2/FLU/RSV plus assay is intended as an aid in the diagnosis of influenza from Nasopharyngeal swab specimens and should not be used as a sole basis for treatment. Nasal washings and aspirates are unacceptable for Xpert Xpress SARS-CoV-2/FLU/RSV testing.  Fact Sheet for Patients: BloggerCourse.com  Fact Sheet for Healthcare Providers: SeriousBroker.it  This test is not yet approved or cleared by the [place] FDA and has been authorized for detection and/or diagnosis of SARS-CoV-2 by FDA under an Emergency Use Authorization (EUA). This EUA will remain in effect (meaning this test can be used) for the duration of the COVID-19 declaration under Section 564(b)(1) of the Act, 21 U.S.C. section 360bbb-3(b)(1), unless the authorization is terminated or revoked.  Performed at [facility], [address], [place], [place] [address]   Culture, blood (Routine x 2)     Status: None (Preliminary result)   Collection Time: 12/16/21  5:40 AM   Specimen: BLOOD  Result Value Ref Range Status   Specimen Description   Final    BLOOD BLOOD RIGHT FOREARM Performed at [facility],  [address] [place], [place] [address]    Special Requests   Final    BOTTLES DRAWN AEROBIC AND ANAEROBIC Blood Culture results may not be optimal due to an inadequate volume of blood received in culture bottles Performed at [facility], [address] [place], [place] [address]    Culture   Final    NO GROWTH 2 DAYS Performed at [facility], [address] [place], [place] [address]    Report Status PENDING  Incomplete  MRSA Next Gen by PCR, Nasal     Status: None   Collection Time: 12/16/21 12:47 PM   Specimen: Nasal Mucosa; Nasal Swab  Result Value Ref Range Status   MRSA by PCR Next Gen NOT DETECTED NOT DETECTED Final    Comment: (NOTE) The GeneXpert MRSA Assay (FDA approved for NASAL specimens only), is one component of a comprehensive MRSA colonization surveillance program. It is not intended to diagnose MRSA infection nor to guide or monitor treatment for MRSA infections. Test performance is not FDA approved in patients less than 42 years old. Performed at [facility], [address], [place], [place] [address]    Radiology Studies: No results found.  Scheduled Meds:  chlorhexidine  15 mL Mouth Rinse BID   Chlorhexidine Gluconate Cloth  6 each Topical Daily   feeding supplement (PROSource TF)  45 mL Per Tube Daily   free water  150 mL Per Tube Q6H   heparin  5,000 Units Subcutaneous Q8H   levETIRAcetam  500 mg Per Tube BID   mouth rinse  15 mL Mouth Rinse q12n4p   memantine  10 mg Per Tube QHS   valproic acid  500 mg Per Tube QHS   And   valproic acid  1,000 mg Per Tube Daily   Continuous Infusions:   ampicillin-sulbactam (UNASYN) IV 3 g (12/19/21 0658)   dextrose 50 mL/hr at 12/19/21 0945   feeding supplement (JEVITY 1.2 CAL) 30 mL (12/18/21 1846)     LOS: 3 days    Time spent: 25 mins    Chelbi Herber, MD [facility]   If 7PM-7AM, please contact night-coverage

## 2021-12-19 NOTE — TOC Progression Note (Signed)
Transition of Care [facility] - Progression Note    Patient Details  Name: ORESTES GEIMAN MRN: [id number] Date of Birth: May 30, 1935  [facility]) CM/SW Contact  Keaja Reaume, Olegario Messier, RN Phone Number: 12/19/2021, 1:26 PM  Clinical Narrative: Currently has rectal tube in place-MD notified [facility] unable to accept back w/rectal tube. Spoke to [employer] [facility] rep Grace-able to accept back once stable-patient is Private pay(no auth needed-he would be denied-[facility] Jeronimo Norma & spouse aware of private pay)[facility] will continue to follow for hospice services.      Expected Discharge Plan: Skilled Nursing Facility ([facility]) Barriers to Discharge: Continued Medical Work up  Expected Discharge Plan and Services Expected Discharge Plan: Skilled Nursing Facility [facility])   Discharge Planning Services: CM Consult Post Acute Care Choice: Skilled Nursing Facility Living arrangements for the past 2 months: Single Family Home                                       Social Determinants of Health (SDOH) Interventions    Readmission Risk Interventions Readmission Risk Prevention Plan 10/22/2021  Transportation Screening Complete  Medication Review [facility]) Referral to Pharmacy  PCP or Specialist appointment within 3-5 days of discharge Complete  HRI or Home Care Consult Not Complete  HRI or Home Care Consult Pt Refusal Comments from SNF  SW Recovery Care/Counseling Consult Complete  Palliative Care Screening Not Applicable  Skilled Nursing Facility Complete  Some recent data might be hidden

## 2021-12-19 NOTE — Progress Notes (Signed)
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 Family requested patient be transported to ED from facility due to respiratory distress. Hospice was not notified prior to transport. Patient was admitted to the hospital with diagnosis of sepsis and pneumonia. Per Dr. Renato Gails with [facility], this is a related admission.   Visited at bedside, patient is asleep and does not wake to gentle stimulation. He does however moan gently whenever touched at all. Spoke at length with is wife at bedside. She got call from the hospital CM who believes patient may likely discharge tomorrow. Patient does however still have flexicil and it is unclear if it will be continued at the SNF.    Patient remains inpatient appropriate due to symptom management/treatment.    V/S: 98.5/75/16  138/80, 98% on 2L O2 via [place]   I/O: 2728/1350   Abnormal Labs: Glucose 150, Creatinine 0.49, Ca+ 8.2, WBC 12.4, Hgb 9.6, Hct 31.3, Platelets 428   Diagnostics: None   IV/PRN: Ampicillin-Sulbactam (UNASYN) in sodium chloride 0.9 % 100 mL IVPB 3 g Every 6 hours IV, dextrose 5 % solution 50 mL/hr Continuous IV   Problem List: Sepsis secondary to aspiration pneumonia: Patient presented with SIRS criteria, Chest x-ray shows possible infiltrate. Continue broad-spectrum antibiotics.  Continue DuoNeb nebulization. Continue IV hydration, blood cultures no growth to date. Lactic acid 1.3, procalcitonin 2.0, no leukocytosis.  MRSA nares negative. Antibiotic de-escalated to Unasyn.  Vancomycin discontinued. Sepsis physiology is improving.   Acute on chronic metabolic encephalopathy: This could be secondary to aspiration pneumonia. Patient seems back to baseline mental status now. Continue dementia medication.   Dysphagia: Patient has PEG tube in place.  Continue tube feedings.   Seizure  disorder: Continue seizure medication through PEG tube   Sacral decubitus POA: Wound care consult.    Discharge Planning: Current plan is for patient to return to [place] with hospice services to continue once stable for discharge.   Family Contact: Talked with wife at length at bedside.    IDT: Updated   Goals of Care: Ongoing. Treat the treatable.   Please call with any hospice related questions/concerns.   Once patient is ready for discharge please use [facility] as they contract this service for our current patients.   Thea Gist, BSN, Charity fundraiser, [facility] hospital liaison [phone]

## 2021-12-20 DIAGNOSIS — A419 Sepsis, unspecified organism: Secondary | ICD-10-CM | POA: Diagnosis not present

## 2021-12-20 LAB — CBC
HCT: 29.5 % — ABNORMAL LOW (ref 39.0–52.0)
Hemoglobin: 9.2 g/dL — ABNORMAL LOW (ref 13.0–17.0)
MCH: 26.8 pg (ref 26.0–34.0)
MCHC: 31.2 g/dL (ref 30.0–36.0)
MCV: 86 fL (ref 80.0–100.0)
Platelets: 275 10*3/uL (ref 150–400)
RBC: 3.43 MIL/uL — ABNORMAL LOW (ref 4.22–5.81)
RDW: 14.8 % (ref 11.5–15.5)
WBC: 6.5 10*3/uL (ref 4.0–10.5)
nRBC: 0 % (ref 0.0–0.2)

## 2021-12-20 LAB — BASIC METABOLIC PANEL
Anion gap: 5 (ref 5–15)
BUN: 15 mg/dL (ref 8–23)
CO2: 29 mmol/L (ref 22–32)
Calcium: 7.9 mg/dL — ABNORMAL LOW (ref 8.9–10.3)
Chloride: 108 mmol/L (ref 98–111)
Creatinine, Ser: 0.35 mg/dL — ABNORMAL LOW (ref 0.61–1.24)
GFR, Estimated: >60 mL/min (ref >60)
Glucose, Bld: 126 mg/dL — ABNORMAL HIGH (ref 70–99)
Potassium: 3.4 mmol/L — ABNORMAL LOW (ref 3.5–5.1)
Sodium: 142 mmol/L (ref 135–145)

## 2021-12-20 LAB — GLUCOSE, CAPILLARY
Glucose-Capillary: 113 mg/dL — ABNORMAL HIGH (ref 70–99)
Glucose-Capillary: 120 mg/dL — ABNORMAL HIGH (ref 70–99)
Glucose-Capillary: 123 mg/dL — ABNORMAL HIGH (ref 70–99)
Glucose-Capillary: 129 mg/dL — ABNORMAL HIGH (ref 70–99)
Glucose-Capillary: 83 mg/dL (ref 70–99)

## 2021-12-20 LAB — MAGNESIUM: Magnesium: 1.9 mg/dL (ref 1.7–2.4)

## 2021-12-20 LAB — RESP PANEL BY RT-PCR (FLU A&B, COVID) ARPGX2
Influenza A by PCR: NEGATIVE
Influenza B by PCR: NEGATIVE
SARS Coronavirus 2 by RT PCR: NEGATIVE

## 2021-12-20 LAB — PHOSPHORUS: Phosphorus: 2.8 mg/dL (ref 2.5–4.6)

## 2021-12-20 MED ORDER — AMOXICILLIN-POT CLAVULANATE 875-125 MG PO TABS
1.0000 | ORAL_TABLET | Freq: Two times a day (BID) | ORAL | 0 refills | Status: DC
Start: 2021-12-20 — End: 2021-12-20

## 2021-12-20 MED ORDER — POTASSIUM CHLORIDE 20 MEQ PO PACK
40.0000 meq | PACK | Freq: Once | ORAL | Status: AC
  Administered 2021-12-20: 10:00:00 40 meq
  Filled 2021-12-20: qty 2

## 2021-12-20 MED ORDER — AMOXICILLIN-POT CLAVULANATE 875-125 MG PO TABS: 1.0000 | ORAL_TABLET | Freq: Two times a day (BID) | ORAL | 0 refills | Status: AC

## 2021-12-20 NOTE — Progress Notes (Signed)
[facility] called and transportation arranged. Report called to [facility].

## 2021-12-20 NOTE — Discharge Summary (Addendum)
Physician Discharge Summary  [name] [name] ZOX:[id number] DOB: 1935/10/13 DOA: 12/16/2021  PCP: [name], MD  Admit date: 12/16/2021  Discharge date: 12/20/2021  [facility] From[facility]  Disposition: SNF([facility])  Recommendations for Outpatient Follow-up:  Follow up with PCP in 1-2 weeks. Please obtain BMP/CBC in one week. Advised to take Augmentin twice a day for 3 more days through PEG tube. Patient is to return back to skilled nursing facility for rehab with hospice following.  Home Health: None Equipment/Devices: PEG tube/ Foley catheter  Discharge Condition: Stable CODE STATUS:DNR Diet recommendation: NPO, Patient is on PEG tube feeds.  Brief Summary / Hospital Course: This 86 years old male with PMH significant for dementia, seizure disorder, BPH, recurrent aspiration pneumonia was brought from [place] skilled nursing facility for increased somnolence and shortness of breath.  History is obtained from chart review since patient is somnolent and has history of dementia.  He was also found to be febrile at nursing home. He was hypoxic initially placed on nonrebreather but then weaned down to room air. Patient was admitted for sepsis secondary to aspiration pneumonia and started on IV antibiotics.  Patient is a hospice patient followed by [facility].  Plan is to complete course of antibiotics and return back with hospice services to nursing home.  Patient was managed with IV antibiotics.  He has completed 5 days of IV Unasyn transition to Augmentin for 3 more days.  Patient remains afebrile patient is back to his baseline mental status.  Patient has required rectal tube for diarrhea which has been removed.  Patient is being discharged back to [facility] for rehab.    He was managed for below problems  Discharge Diagnoses:  Principal Problem:   Sepsis (HCC) Active Problems:   AKI (acute kidney injury) (HCC)  Sepsis secondary to aspiration  pneumonia: Patient presented with SIRS criteria, Chest x-ray shows possible infiltrate. Continued broad-spectrum antibiotics.  Continue DuoNeb nebulization. Continue IV hydration, blood cultures no growth to date. Lactic acid 1.3, procalcitonin 2.0, no leukocytosis.  MRSA nares negative. Antibiotic de-escalated to Unasyn.  Vancomycin discontinued. Sepsis physiology has resolved.Marland Kitchen  Antibiotics changed to Augmentin.   Acute kidney injury: > Resolved. Suspect prerenal due to decreased p.o. intake Continue IV hydration, renal functions back to normal.   Acute on chronic metabolic encephalopathy: This could be secondary to aspiration pneumonia. Patient seems back to baseline mental status now. Continue dementia medication.   Dysphagia: Patient has PEG tube in place.  Continue tube feedings.   Seizure disorder: Continue seizure medication through PEG tube   Sacral decubitus POA: Wound care consult.   Goals of care discussion: Family is in the middle of conversation with hospice team for possible outpatient hospice.  It sounds like it was about to be arranged.  Family wants to continue antibiotics for pneumonia.  Family will make decision about making him comfort care during this admission,  decision has to be made.  Palliative care is consulted.  Discharge Instructions  Discharge Instructions     Call MD for:  difficulty breathing, headache or visual disturbances   Complete by: As directed    Call MD for:  persistant dizziness or light-headedness   Complete by: As directed    Call MD for:  persistant nausea and vomiting   Complete by: As directed    Diet - low sodium heart healthy   Complete by: As directed    Discharge instructions   Complete by: As directed  Advised to follow-up with primary care physician in 1 week. Advised to take Augmentin twice a day for 3 more days through PEG tube. Patient is return back to skilled nursing facility for rehab with hospice following.    Discharge wound care:   Complete by: As directed    Follow-up wound care at skilled nursing facility.   Increase activity slowly   Complete by: As directed       Allergies as of 12/20/2021       Reactions   Chocolate    Itching if he eats too much chocolate    Ciprofloxacin    Achilles tendon pain    Lipitor [atorvastatin]    Muscle aches         Medication List     STOP taking these medications    glycopyrrolate 1 MG tablet Commonly known as: Robinul       TAKE these medications    acetaminophen 500 MG tablet Commonly known as: TYLENOL Place 1 tablet (500 mg total) into feeding tube every 8 (eight) hours as needed. What changed: reasons to take this   albuterol (2.5 MG/3ML) 0.083% nebulizer solution Commonly known as: PROVENTIL Take 3 mLs (2.5 mg total) by nebulization every 2 (two) hours as needed for wheezing or shortness of breath.   amoxicillin-clavulanate 875-125 MG tablet Commonly known as: AUGMENTIN Place 1 tablet into feeding tube every 12 (twelve) hours for 3 days. What changed: additional instructions   dorzolamide 2 % ophthalmic solution Commonly known as: TRUSOPT Place 1 drop into both eyes 3 (three) times daily.   feeding supplement (JEVITY 1.2 CAL) Liqd Place 1,000 mLs into feeding tube continuous. 70 ml /hr via G tube continuously with 150 ml auto flush every 6 hours continuously. What changed: Another medication with the same name was removed. Continue taking this medication, and follow the directions you see here.   fish oil-omega-3 fatty acids 1000 MG capsule Place 1 capsule (1 g total) into feeding tube daily.   free water Soln Place 150 mLs into feeding tube every 6 (six) hours.   Glucosamine 500 MG Caps Take 1 capsule (500 mg total) by mouth daily. What changed: how to take this   guaiFENesin 100 MG/5ML liquid Commonly known as: ROBITUSSIN Place 15 mLs into feeding tube every 4 (four) hours as needed for cough or to loosen  phlegm.   latanoprost 0.005 % ophthalmic solution Commonly known as: XALATAN Place 2 drops into both eyes at bedtime.   levETIRAcetam 100 MG/ML solution Commonly known as: KEPPRA Place 5 mLs (500 mg total) into feeding tube 2 (two) times daily.   melatonin 5 MG Tabs Place 2 tablets (10 mg total) into feeding tube at bedtime.   memantine 10 MG tablet Commonly known as: NAMENDA Place 1 tablet (10 mg total) into feeding tube at bedtime.   multivitamin Liqd Place 5 mLs into feeding tube daily.   Proteinex P18 Liqd Place 45 mLs into feeding tube daily.   Rhopressa 0.02 % Soln Generic drug: Netarsudil Dimesylate Place 1 drop into both eyes at bedtime.   saccharomyces boulardii 250 MG capsule Commonly known as: FLORASTOR Place 1 capsule (250 mg total) into feeding tube 2 (two) times daily for 20 days.   triamcinolone cream 0.1 % Commonly known as: KENALOG Apply 1 application topically 3 (three) times daily as needed (irritation).   valproic acid 250 MG/5ML solution Commonly known as: DEPAKENE Place 10-20 mLs (500-1,000 mg total) into feeding tube See admin instructions. 1000 mg  daily and 500 mg at bedtime What changed:  how much to take when to take this additional instructions   vitamin C 250 MG tablet Commonly known as: ASCORBIC ACID Place 1 tablet (250 mg total) into feeding tube daily.   zinc oxide 20 % ointment Apply 1 application topically 2 (two) times daily. To buttocks for wound care               Discharge Care Instructions  (From admission, onward)           Start     Ordered   12/20/21 0000  Discharge wound care:       Comments: Follow-up wound care at skilled nursing facility.   12/20/21 1140            Contact information for follow-up providers     [name], MD Follow up in 1 week(s).   Specialty: Family Medicine Contact information: [address] [place] [address] [phone]         Lewayne Bunting,  MD .   Specialty: Cardiology Contact information: [address] [place] [address] [phone]              Contact information for after-discharge care     Destination     [facility] .   Service: Skilled Nursing Contact information: [address] [place] [address] [phone]                    Allergies  Allergen Reactions   Chocolate     Itching if he eats too much chocolate    Ciprofloxacin     Achilles tendon pain    Lipitor [Atorvastatin]     Muscle aches     Consultations: None   Procedures/Studies: CT ABDOMEN PELVIS WO CONTRAST  Result Date: 11/23/2021 CLINICAL DATA:  Preoperative evaluation for gastric tube placement. EXAM: CT ABDOMEN AND PELVIS WITHOUT CONTRAST TECHNIQUE: Multidetector CT imaging of the abdomen and pelvis was performed following the standard protocol without IV contrast. COMPARISON:  08/20/2014.  05/25/2012. FINDINGS: Lower chest: Bilateral pleural effusions larger on the left than the right with dependent atelectasis and or infiltrate, worse in the left lower lobe than the right lower lobe. Cardiomegaly. Small amount of pericardial fluid. Hepatobiliary: Liver appears normal without contrast. No calcified gallstones. Pancreas: Normal Spleen: Normal Adrenals/Urinary Tract: Adrenal glands are normal. Left kidney is normal. Right kidney shows a 17 mm calcified mass in the ventral upper pole. This is not enlarged and is actually slightly smaller than was seen in 2013 and 2015. This was felt previously to represent a benign calcified hemorrhagic or proteinaceous cyst, and the follow-up would be consistent with that diagnosis. No progressive or worrisome finding. Bladder is thick-walled. Stomach/Bowel: Nasogastric tube enters the stomach. Stomach appears normally positioned. There is no interposed colon presently between the body and antrum of the stomach and the anterior abdominal  wall. The inferior aspect of the left lobe does overlie the lesser curvature region. No small bowel pathology is seen. Question the presence of a rectal mass. Digital examination recommended. Vascular/Lymphatic: Aortic atherosclerosis. No aneurysm. IVC is normal. No adenopathy. Reproductive: Normal except for enlarged prostate. Other: No free fluid or air. Small left inguinal hernia containing only fat. Musculoskeletal: This chronic degenerative changes affect the spine. IMPRESSION: Bilateral pleural effusions, larger on the left than the right, with atelectasis in both lower lobes, left worse than right. Cardiomegaly.  Pericardial  effusion. No unexpected gastric anatomic findings. No interposed colon between the anterior wall of the body and antrum of the stomach and the anterior abdominal wall. Chronic 1.7 cm calcified region at the upper pole the right kidney, actually smaller than seen 79 years ago, consistent with a benign entity. See above discussion. Enlarged prostate. Question rectal mass versus proctitis. Thick-walled bladder suggesting cystitis or chronic outlet obstruction. Small left inguinal hernia containing only fat. Electronically Signed   By: Paulina Fusi M.D.   On: 11/23/2021 19:08   DG Chest 1 View  Result Date: 12/12/2021 CLINICAL DATA:  Difficulty breathing.  Questionable aspiration. EXAM: CHEST  1 VIEW COMPARISON:  12/09/2021 and older studies. FINDINGS: Cardiac silhouette is mildly enlarged. No mediastinal or hilar masses. There is opacity at the left lung base similar to the prior study. Minor linear opacities at the right lung base. Remainder of the lungs is clear. Possible small left pleural effusion.  No pneumothorax. Skeletal structures are grossly intact. IMPRESSION: 1. No significant change from the prior chest radiograph. Left lung base opacity consistent with a combination of atelectasis and/or pneumonia and a small effusion. Minor right lung base opacity, likely atelectasis,  improved when compared to the chest radiograph dated 12/06/2021. 2. No evidence of pulmonary edema. Electronically Signed   By: Amie Portland M.D.   On: 12/12/2021 14:47   DG Abd 1 View  Result Date: 11/21/2021 CLINICAL DATA:  NG placement. EXAM: ABDOMEN - 1 VIEW COMPARISON:  Earlier radiograph dated 11/19/2021. FINDINGS: Feeding tube with weighted tip in the upper abdomen likely in the proximal stomach. Small left and possibly trace right pleural effusion. There is bibasilar atelectasis or infiltrate. Bilateral hazy and interstitial densities may represent edema. Pneumonia is not excluded. There is cardiomegaly. No acute osseous pathology. IMPRESSION: Feeding tube with weighted tip in the proximal stomach. Electronically Signed   By: Elgie Collard M.D.   On: 11/21/2021 03:57   CT HEAD WO CONTRAST ( )  Result Date: 12/06/2021 CLINICAL DATA:  Encephalopathy, altered mental status EXAM: CT HEAD WITHOUT CONTRAST TECHNIQUE: Contiguous axial images were obtained from the base of the skull through the vertex without intravenous contrast. COMPARISON:  11/18/2021 FINDINGS: Brain: No acute infarct, hemorrhage, mass, mass effect, or midline shift. No extra-axial collection or hydrocephalus. Unchanged generalized atrophy. Periventricular white matter changes, likely the sequela of chronic small vessel ischemic disease. Lacunar infarcts in the basal ganglia. Vascular: No hyperdense vessel. Skull: Normal. Negative for fracture or focal lesion. Sinuses/Orbits: Postsurgical changes in the paranasal sinuses. Redemonstrated left maxillary mucous retention cyst. Mucosal thickening in the hypoplastic right maxillary sinus and ethmoid air cells. Other: None. IMPRESSION: No acute intracranial process. Electronically Signed   By: Wiliam Ke M.D.   On: 12/06/2021 19:13   IR GASTROSTOMY TUBE MOD SED  Result Date: 11/26/2021 INDICATION: 86 year old gentleman with dementia, pneumonia, dysphagia requires feeding tube  access. EXAM: Percutaneous gastrostomy tube placement MEDICATIONS: Ancef 2 g IV; Antibiotics were administered within 1 hour of the procedure. Glucagon 1 mg IV ANESTHESIA/SEDATION: Moderate (conscious) sedation was employed during this procedure. A total of Versed 1 mg and Fentanyl 50 mcg was administered intravenously by the radiology nurse. Total intra-service moderate Sedation Time: 16 minutes. The patient's level of consciousness and vital signs were monitored continuously by radiology nursing throughout the procedure under my direct supervision. CONTRAST:  15 mL of Omnipaque 300-administered into the gastric lumen. FLUOROSCOPY TIME:  Fluoroscopy Time: 1 minutes 42 seconds (8 mGy). COMPLICATIONS: None immediate. PROCEDURE: Informed written consent  was obtained from the patient after a thorough discussion of the procedural risks, benefits and alternatives. All questions were addressed. Maximal Sterile Barrier Technique was utilized including caps, mask, sterile gowns, sterile gloves, sterile drape, hand hygiene and skin antiseptic. A timeout was performed prior to the initiation of the procedure. The left hepatic lobe margin was marked utilizing ultrasound guidance. The epigastric region was prepped and draped in the usual sterile fashion. The stomach was insufflated utilizing the NG tube. Following local lidocaine administration, 2 gastropexies were placed to secure the anterior wall of the stomach to the anterior abdominal wall. Percutaneous access obtained into the gastric antrum at the center of the gastropexies with an 18 gauge needle. Guide wire advanced into the gastric lumen. Serial dilation was performed and peel-away sheath was placed. 18 French gastrostomy tube inserted over the guidewire into the gastric lumen. The G tube retention balloon was inflated with 10 mL of dilute contrast and retracted to the anterior gastric wall. Contrast administrated through the gastrostomy tube opacified the gastric  lumen. The insertion site was covered with sterile dressing. IMPRESSION: 81 French gastrostomy tube placement as above. Electronically Signed   By: Acquanetta Belling M.D.   On: 11/26/2021 16:12   US RENAL  Result Date: 12/16/2021 CLINICAL DATA:  Acute kidney injury EXAM: RENAL / URINARY TRACT ULTRASOUND COMPLETE COMPARISON:  CT abdomen 11/23/2021 FINDINGS: Right Kidney: Renal measurements: 12.3 x 5.3 x 5 cm = volume: 169.26 mL. Echogenicity within normal limits. No mass or hydronephrosis visualized. 13 mm calcified mass in the upper pole of the right kidney consistent with a calcified hemorrhagic or proteinaceous cyst. No further evaluation recommended. Left Kidney: Renal measurements: 11.3 x 5.6 x 4.2 cm = volume: 139.82 mL. Echogenicity within normal limits. No mass or hydronephrosis visualized. Bladder: Foley catheter in the bladder.  Bladder is decompressed. Other: None. IMPRESSION: 1. No obstructive uropathy. Electronically Signed   By: Elige Ko M.D.   On: 12/16/2021 13:44   DG Chest Port 1 View  Result Date: 12/16/2021 CLINICAL DATA:  Shortness of breath. EXAM: PORTABLE CHEST 1 VIEW COMPARISON:  12/13/2021 FINDINGS: 0605 hours. Bibasilar atelectasis or infiltrate noted with small left pleural effusion. Cardiopericardial silhouette is at upper limits of normal for size. The visualized bony structures of the thorax show no acute abnormality. Telemetry leads overlie the chest. IMPRESSION: Bibasilar atelectasis or infiltrate with small left pleural effusion. Electronically Signed   By: Kennith Center M.D.   On: 12/16/2021 06:13   DG Chest Port 1 View  Result Date: 12/13/2021 CLINICAL DATA:  Fever. EXAM: PORTABLE CHEST 1 VIEW COMPARISON:  Chest x-ray 12/12/2021 FINDINGS: There is a small left pleural effusion. There is some strandy opacities in both lung bases. There is no evidence for pneumothorax. Aorta is ectatic in the heart is normal in size. There are healed right third, fourth, fifth and sixth  rib fractures, unchanged. IMPRESSION: 1. Small left pleural effusion with minimal bibasilar atelectasis. Electronically Signed   By: Darliss Cheney M.D.   On: 12/13/2021 21:15   DG CHEST PORT 1 VIEW  Result Date: 12/09/2021 CLINICAL DATA:  Rales EXAM: PORTABLE CHEST 1 VIEW COMPARISON:  Chest x-ray dated December 06, 2021 FINDINGS: Cardiac and mediastinal contours are unchanged. Lower lung predominant heterogeneous opacities. Small left pleural effusion. No evidence of pneumothorax. Multiple right-sided rib fractures which appear chronic. IMPRESSION: 1. Lower lung predominant heterogeneous opacities are unchanged when compared with prior exam, differential includes atelectasis, infection or aspiration. 2. Small left pleural effusion.  Electronically Signed   By: Allegra Lai M.D.   On: 12/09/2021 09:45   DG Chest Port 1 View  Result Date: 12/06/2021 CLINICAL DATA:  Cough, increased secretions EXAM: PORTABLE CHEST 1 VIEW COMPARISON:  11/18/2021 FINDINGS: Stable cardiomediastinal contours. Patchy bibasilar airspace opacities, right worse than left. Possible trace bilateral pleural effusions. No pneumothorax. IMPRESSION: Patchy bibasilar airspace opacities, right worse than left, suspicious for pneumonia. Electronically Signed   By: Duanne Guess D.O.   On: 12/06/2021 10:25   DG Swallowing Func-Speech Pathology  Result Date: 11/22/2021 Table formatting from the original result was not included. Objective Swallowing Evaluation: Type of Study: MBS-Modified Barium Swallow Study  Patient Details Name: [name] [name] MRN: [id number] Date of Birth: 07/22/1935 Today's Date: 11/22/2021 Time: SLP Start Time (ACUTE ONLY): 1345 -SLP Stop Time (ACUTE ONLY): 1405 SLP Time Calculation (min) (ACUTE ONLY): 20 min Past Medical History: Past Medical History: Diagnosis Date  Allergy   Dementia (HCC)   sees Dr. Patrcia Dolly   Diverticulosis of colon (without mention of hemorrhage) 03-01-2004, 04-04-2011  Colonoscopy  ED  (erectile dysfunction)   Glaucoma   sees Dr. Arvil Chaco   Hyperglycemia   Hyperlipidemia   Hypertension   Internal hemorrhoids 03-15-1999  Flex Sig   Irritable bowel  Past Surgical History: Past Surgical History: Procedure Laterality Date  COLONOSCOPY  04-04-11  per Dr. Jarold Motto, clear, no repeats needed  EYE SURGERY    bilateral cataract extraction per Dr Jettie Pagan  INGUINAL HERNIA REPAIR    KNEE ARTHROSCOPY    right knee  NASAL SINUS SURGERY   HPI: [name] [name] is a 86 y.o. male with known history of dementia, seizures, BPH, decubitus ulcers, pericardial effusion anemia was recently admitted for pneumonia discharged about 2 weeks ago was brought to the ER after patient was found to be increasingly confused and lethargic. CT chest was showing bilateral infiltrates concerning for pneumonia also was showing mild pericardial effusion. Most recent MBS 10/28/21 recommending Dys 2/honey thick and pt was able to upgrade to Dys 2/nectar prior to discharge.  Subjective: lethargic but able to particiate in minimal amount of PO intake and follow basic directions  Recommendations for follow up therapy are one component of a multi-disciplinary discharge planning process, led by the attending physician.  Recommendations may be updated based on patient status, additional functional criteria and insurance authorization. Assessment / Plan / Recommendation Clinical Impressions 11/22/2021 Clinical Impression Patient presents with a mild oral and a severe pharyngeal phase dysphagia which is significantly declined as compared to MBS documented from 3 weeks ago. NG feeding tube in place during today's study and was in place during prior MBS as well. During today's MBS, patient was lethargic and required cues to maintain alertness. He was able to follow basic directions to swallow again, cough. First PO administered was puree texture and majority of this bolus become retained in vallecular and pyriform sinus. He exhibited one instance of  sensed aspiration (PAS 7) of moderate amount of puree which occured after the swallow and was from pyriform sinus residuals. Several other instances of aspiration occured, all of which were during or after swallows and all were silent. (PAS 8). SLP then gave patient spoon sip of nectar thick liquids, followed by spoon sip of thin liquid barium, both of which did not aid in reducing vallecular and sinus residuals. Very small amount of barium contrast would transit through UES with each swallow. Patient was able to cough and transit penetrate, aspirate out of laryngeal vestibule,  but not consistently. There was a mild reduction in amount of vallecular and pyriform sinus residuals with multiple cued cough/throat clear and reswallows, dry swallows, however unable to achieve full clearance of pharyngeal residuals by end of study. SLP is recommending continue with NPO status and prognosis of PO advancement is guarded at this time. SLP Visit Diagnosis Dysphagia, oropharyngeal phase (R13.12) Attention and concentration deficit following -- Frontal lobe and executive function deficit following -- Impact on safety and function Severe aspiration risk;Risk for inadequate nutrition/hydration   Treatment Recommendations 11/22/2021 Treatment Recommendations Therapy as outlined in treatment plan below   Prognosis 11/22/2021 Prognosis for Safe Diet Advancement Guarded Barriers to Reach Goals Severity of deficits;Cognitive deficits Barriers/Prognosis Comment -- Diet Recommendations 11/22/2021 SLP Diet Recommendations NPO Liquid Administration via -- Medication Administration Via alternative means Compensations -- Postural Changes --   Other Recommendations 11/22/2021 Recommended Consults -- Oral Care Recommendations Oral care QID;Staff/trained caregiver to provide oral care Other Recommendations -- Follow Up Recommendations Skilled nursing-short term rehab (<3 hours/day) Assistance recommended at discharge Frequent or constant  Supervision/Assistance Functional Status Assessment Patient has had a recent decline in their functional status and/or demonstrates limited ability to make significant improvements in function in a reasonable and predictable amount of time Frequency and Duration  11/22/2021 Speech Therapy Frequency (ACUTE ONLY) min 1 x/week Treatment Duration 1 week   Oral Phase 11/22/2021 Oral Phase Impaired Oral - Pudding Teaspoon -- Oral - Pudding Cup -- Oral - Honey Teaspoon -- Oral - Honey Cup NT Oral - Nectar Teaspoon Delayed oral transit;Reduced posterior propulsion Oral - Nectar Cup NT Oral - Nectar Straw NT Oral - Thin Teaspoon Delayed oral transit;Reduced posterior propulsion Oral - Thin Cup NT Oral - Thin Straw NT Oral - Puree Delayed oral transit Oral - Mech Soft -- Oral - Regular NT Oral - Multi-Consistency -- Oral - Pill NT Oral Phase - Comment --  Pharyngeal Phase 11/22/2021 Pharyngeal Phase Impaired Pharyngeal- Pudding Teaspoon -- Pharyngeal -- Pharyngeal- Pudding Cup -- Pharyngeal -- Pharyngeal- Honey Teaspoon NT Pharyngeal -- Pharyngeal- Honey Cup NT Pharyngeal -- Pharyngeal- Nectar Teaspoon Reduced anterior laryngeal mobility;Reduced airway/laryngeal closure;Penetration/Apiration after swallow;Moderate aspiration;Pharyngeal residue - valleculae;Pharyngeal residue - pyriform Pharyngeal -- Pharyngeal- Nectar Cup NT Pharyngeal -- Pharyngeal- Nectar Straw -- Pharyngeal -- Pharyngeal- Thin Teaspoon Delayed swallow initiation-vallecula;Reduced anterior laryngeal mobility;Reduced airway/laryngeal closure;Moderate aspiration;Penetration/Apiration after swallow;Penetration/Aspiration during swallow;Pharyngeal residue - valleculae;Pharyngeal residue - pyriform Pharyngeal -- Pharyngeal- Thin Cup NT Pharyngeal -- Pharyngeal- Thin Straw NT Pharyngeal -- Pharyngeal- Puree Delayed swallow initiation-vallecula;Penetration/Apiration after swallow;Moderate aspiration;Pharyngeal residue - pyriform;Pharyngeal residue -  valleculae;Reduced anterior laryngeal mobility;Reduced airway/laryngeal closure Pharyngeal -- Pharyngeal- Mechanical Soft -- Pharyngeal -- Pharyngeal- Regular NT Pharyngeal -- Pharyngeal- Multi-consistency -- Pharyngeal -- Pharyngeal- Pill NT Pharyngeal -- Pharyngeal Comment --  Cervical Esophageal Phase  11/22/2021 Cervical Esophageal Phase Impaired Pudding Teaspoon -- Pudding Cup -- Honey Teaspoon -- Honey Cup -- Nectar Teaspoon Reduced cricopharyngeal relaxation Nectar Cup -- Nectar Straw -- Thin Teaspoon Reduced cricopharyngeal relaxation Thin Cup Reduced cricopharyngeal relaxation Thin Straw -- Puree Reduced cricopharyngeal relaxation Mechanical Soft -- Regular -- Multi-consistency -- Pill -- Cervical Esophageal Comment -- Angela Nevin, MA, CCC-SLP Speech Therapy                     VAS [place] UPPER EXTREMITY VENOUS DUPLEX  Result Date: 11/22/2021 UPPER VENOUS STUDY  Patient Name:  [name] [name]  Date of Exam:   11/21/2021 Medical Rec #: [id number]         Accession #:  [id number] Date of Birth: 02/28/1935         Patient Gender: M Patient Age:   82 years Exam Location:  [facility] Procedure:      VAS [place] UPPER EXTREMITY VENOUS DUPLEX Referring Phys: Parke Poisson XU --------------------------------------------------------------------------------  Indications: Swelling Limitations: Patient immobilty. Comparison Study: No previous exams Performing Technologist: Jody Hill RVT, RDMS  Examination Guidelines: A complete evaluation includes B-mode imaging, spectral Doppler, color Doppler, and power Doppler as needed of all accessible portions of each vessel. Bilateral testing is considered an integral part of a complete examination. Limited examinations for reoccurring indications may be performed as noted.  Right Findings: +----------+------------+---------+-----------+----------+-------+  RIGHT      Compressible Phasicity Spontaneous Properties Summary   +----------+------------+---------+-----------+----------+-------+  IJV            Full        Yes        Yes                         +----------+------------+---------+-----------+----------+-------+  Subclavian     Full        Yes        Yes                         +----------+------------+---------+-----------+----------+-------+  Axillary       Full        Yes        Yes                         +----------+------------+---------+-----------+----------+-------+  Brachial       Full                                               +----------+------------+---------+-----------+----------+-------+  Radial         Full                                               +----------+------------+---------+-----------+----------+-------+  Ulnar          Full                                               +----------+------------+---------+-----------+----------+-------+  Cephalic       Full                                               +----------+------------+---------+-----------+----------+-------+  Basilic        Full        Yes        Yes                         +----------+------------+---------+-----------+----------+-------+  Left Findings: +----------+------------+---------+-----------+----------+-------+  LEFT       Compressible Phasicity Spontaneous Properties Summary  +----------+------------+---------+-----------+----------+-------+  Subclavian     Full        Yes  Yes                         +----------+------------+---------+-----------+----------+-------+  Summary:  Right: No evidence of deep vein thrombosis in the upper extremity. No evidence of superficial vein thrombosis in the upper extremity.  Left: No evidence of thrombosis in the subclavian.  *See table(s) above for measurements and observations.  Diagnosing physician: Gerarda Fraction Electronically signed by Gerarda Fraction on 11/22/2021 at 10:23:16 AM.    Final      Subjective: Patient was seen and examined at bedside.  Overnight events noted.    Patient is alert, oriented x 1, seems back to his baseline mental status.   Patient is being discharged back to [facility] for rehab.  Discharge Exam: Vitals:   12/19/21 1953 12/20/21 0612  BP: (!) 151/84 137/81  Pulse: 81 73  Resp: 14 16  Temp: 98 F (36.7 C) 98 F (36.7 C)  SpO2: 100% 100%   Vitals:   12/19/21 1437 12/19/21 1953 12/20/21 0500 12/20/21 0612  BP: 121/76 (!) 151/84  137/81  Pulse: 71 81  73  Resp: 16 14  16   Temp: 98.5 F (36.9 C) 98 F (36.7 C)  98 F (36.7 C)  TempSrc: Oral Oral  Oral  SpO2:  100%  100%  Weight:   77.4 kg     General: Appears deconditioned, chronically ill looking, not in any distress Cardiovascular: RRR, S1/S2 +, no rubs, no gallops Respiratory: CTA bilaterally, no wheezing, no rhonchi Abdominal: Soft, NT, ND, bowel sounds +, PEG tube+, Foley catheter+ Extremities: no edema, no cyanosis    The results of significant diagnostics from this hospitalization (including imaging, microbiology, ancillary and laboratory) are listed below for reference.     Microbiology: Recent Results (from the past 240 hour(s))  Resp Panel by RT-PCR (Flu A&B, Covid) Nasopharyngeal Swab     Status: None   Collection Time: 12/14/21  7:13 AM   Specimen: Nasopharyngeal Swab; Nasopharyngeal(NP) swabs in vial transport medium  Result Value Ref Range Status   SARS Coronavirus 2 by RT PCR NEGATIVE NEGATIVE Final    Comment: (NOTE) SARS-CoV-2 target nucleic acids are NOT DETECTED.  The SARS-CoV-2 RNA is generally detectable in upper respiratory specimens during the acute phase of infection. The lowest concentration of SARS-CoV-2 viral copies this assay can detect is 138 copies/mL. A negative result does not preclude SARS-Cov-2 infection and should not be used as the sole basis for treatment or other patient management decisions. A negative result may occur with  improper specimen collection/handling, submission of specimen other than nasopharyngeal swab,  presence of viral mutation(s) within the areas targeted by this assay, and inadequate number of viral copies(<138 copies/mL). A negative result must be combined with clinical observations, patient history, and epidemiological information. The expected result is Negative.  Fact Sheet for Patients:  BloggerCourse.com  Fact Sheet for Healthcare Providers:  SeriousBroker.it  This test is no t yet approved or cleared by the [place] FDA and  has been authorized for detection and/or diagnosis of SARS-CoV-2 by FDA under an Emergency Use Authorization (EUA). This EUA will remain  in effect (meaning this test can be used) for the duration of the COVID-19 declaration under Section 564(b)(1) of the Act, 21 U.S.C.section 360bbb-3(b)(1), unless the authorization is terminated  or revoked sooner.       Influenza A by PCR NEGATIVE NEGATIVE Final   Influenza B by PCR NEGATIVE NEGATIVE Final    Comment: (NOTE)  The Xpert Xpress SARS-CoV-2/FLU/RSV plus assay is intended as an aid in the diagnosis of influenza from Nasopharyngeal swab specimens and should not be used as a sole basis for treatment. Nasal washings and aspirates are unacceptable for Xpert Xpress SARS-CoV-2/FLU/RSV testing.  Fact Sheet for Patients: BloggerCourse.com  Fact Sheet for Healthcare Providers: SeriousBroker.it  This test is not yet approved or cleared by the [place] FDA and has been authorized for detection and/or diagnosis of SARS-CoV-2 by FDA under an Emergency Use Authorization (EUA). This EUA will remain in effect (meaning this test can be used) for the duration of the COVID-19 declaration under Section 564(b)(1) of the Act, 21 U.S.C. section 360bbb-3(b)(1), unless the authorization is terminated or revoked.  Performed at [facility], [address] [place], [place] [address]    Culture, blood (Routine x 2)     Status: None (Preliminary result)   Collection Time: 12/16/21  5:35 AM   Specimen: BLOOD  Result Value Ref Range Status   Specimen Description   Final    BLOOD BLOOD RIGHT FOREARM Performed at [facility], [address], [place], [place] [address]    Special Requests   Final    BOTTLES DRAWN AEROBIC AND ANAEROBIC Blood Culture results may not be optimal due to an inadequate volume of blood received in culture bottles Performed at [facility], [address] [place], [place] [address]    Culture   Final    NO GROWTH 4 DAYS Performed at [facility], [address] [place], [place] [address]    Report Status PENDING  Incomplete  Resp Panel by RT-PCR (Flu A&B, Covid) Nasopharyngeal Swab     Status: None   Collection Time: 12/16/21  5:35 AM   Specimen: Nasopharyngeal Swab; Nasopharyngeal(NP) swabs in vial transport medium  Result Value Ref Range Status   SARS Coronavirus 2 by RT PCR NEGATIVE NEGATIVE Final    Comment: (NOTE) SARS-CoV-2 target nucleic acids are NOT DETECTED.  The SARS-CoV-2 RNA is generally detectable in upper respiratory specimens during the acute phase of infection. The lowest concentration of SARS-CoV-2 viral copies this assay can detect is 138 copies/mL. A negative result does not preclude SARS-Cov-2 infection and should not be used as the sole basis for treatment or other patient management decisions. A negative result may occur with  improper specimen collection/handling, submission of specimen other than nasopharyngeal swab, presence of viral mutation(s) within the areas targeted by this assay, and inadequate number of viral copies(<138 copies/mL). A negative result must be combined with clinical observations, patient history, and epidemiological information. The expected result is Negative.  Fact Sheet for Patients:  BloggerCourse.com  Fact Sheet for  Healthcare Providers:  SeriousBroker.it  This test is no t yet approved or cleared by the [place] FDA and  has been authorized for detection and/or diagnosis of SARS-CoV-2 by FDA under an Emergency Use Authorization (EUA). This EUA will remain  in effect (meaning this test can be used) for the duration of the COVID-19 declaration under Section 564(b)(1) of the Act, 21 U.S.C.section 360bbb-3(b)(1), unless the authorization is terminated  or revoked sooner.       Influenza A by PCR NEGATIVE NEGATIVE Final   Influenza B by PCR NEGATIVE NEGATIVE Final    Comment: (NOTE) The Xpert Xpress SARS-CoV-2/FLU/RSV plus assay is intended as an aid in the diagnosis of influenza from Nasopharyngeal swab specimens and should not be used as a sole basis for treatment. Nasal  washings and aspirates are unacceptable for Xpert Xpress SARS-CoV-2/FLU/RSV testing.  Fact Sheet for Patients: BloggerCourse.com  Fact Sheet for Healthcare Providers: SeriousBroker.it  This test is not yet approved or cleared by the [place] FDA and has been authorized for detection and/or diagnosis of SARS-CoV-2 by FDA under an Emergency Use Authorization (EUA). This EUA will remain in effect (meaning this test can be used) for the duration of the COVID-19 declaration under Section 564(b)(1) of the Act, 21 U.S.C. section 360bbb-3(b)(1), unless the authorization is terminated or revoked.  Performed at [facility], [address] [place], [place] [address]   Culture, blood (Routine x 2)     Status: None (Preliminary result)   Collection Time: 12/16/21  5:40 AM   Specimen: BLOOD  Result Value Ref Range Status   Specimen Description   Final    BLOOD BLOOD RIGHT FOREARM Performed at [facility], [address], [place], [place] [address]    Special Requests   Final    BOTTLES DRAWN AEROBIC AND  ANAEROBIC Blood Culture results may not be optimal due to an inadequate volume of blood received in culture bottles Performed at [facility], [address] [place], [place] [address]    Culture   Final    NO GROWTH 4 DAYS Performed at [facility], [address] [place], [place] [address]    Report Status PENDING  Incomplete  MRSA Next Gen by PCR, Nasal     Status: None   Collection Time: 12/16/21 12:47 PM   Specimen: Nasal Mucosa; Nasal Swab  Result Value Ref Range Status   MRSA by PCR Next Gen NOT DETECTED NOT DETECTED Final    Comment: (NOTE) The GeneXpert MRSA Assay (FDA approved for NASAL specimens only), is one component of a comprehensive MRSA colonization surveillance program. It is not intended to diagnose MRSA infection nor to guide or monitor treatment for MRSA infections. Test performance is not FDA approved in patients less than 65 years old. Performed at [facility], [address], [place], [place] [address]      Labs: BNP (last 3 results) Recent Labs    05/09/21 0232 11/22/21 0401 12/06/21 0900  BNP 114.1* 126.6* 80.1   Basic Metabolic Panel: Recent Labs  Lab 12/16/21 0535 12/16/21 1247 12/17/21 0519 12/18/21 0506 12/19/21 0542 12/20/21 0447  NA 137  --  141 147* 144 142  K 4.6  --  4.3 4.0 5.0 3.4*  CL 104  --  110 113* 111 108  CO2 25  --  27 28 27 29   GLUCOSE 185*  --  96 116* 150* 126*  BUN 74*  --  31* 24* 20 15  CREATININE 3.26* 1.72* 0.59* 0.50* 0.49* 0.35*  CALCIUM 8.0*  --  8.1* 8.2* 8.2* 7.9*  MG  --   --   --   --  2.0 1.9  PHOS  --   --   --   --  3.2 2.8   Liver Function Tests: Recent Labs  Lab 12/16/21 0535 12/17/21 0519  AST 25 39  ALT 33 43  ALKPHOS 67 60  BILITOT 0.4 0.6  PROT 6.5 5.2*  ALBUMIN 2.1* 1.7*   No results for input(s): LIPASE, AMYLASE in the last 168 hours. No results for input(s): AMMONIA in the last 168 hours. CBC: Recent Labs  Lab 12/13/21 2157  12/16/21 0535 12/16/21 1247 12/17/21 0519 12/18/21 0506 12/19/21 0955 12/20/21 0447  WBC  9.8 9.7 11.0* 10.9* 9.1 12.4* 6.5  NEUTROABS 6.9 7.4  --   --   --   --   --   HGB 11.3* 10.4* 10.0* 8.6* 8.8* 9.6* 9.2*  HCT 36.6* 32.7* 32.4* 27.9* 28.7* 31.3* 29.5*  MCV 86.1 84.7 85.9 85.8 87.0 84.8 86.0  PLT 537* 409* 388 339 380 428* 275   Cardiac Enzymes: No results for input(s): CKTOTAL, CKMB, CKMBINDEX, TROPONINI in the last 168 hours. BNP: Invalid input(s): POCBNP CBG: Recent Labs  Lab 12/19/21 1955 12/20/21 0008 12/20/21 0402 12/20/21 0743 12/20/21 1113  GLUCAP 118* 129* 120* 123* 113*   D-Dimer No results for input(s): DDIMER in the last 72 hours. Hgb A1c No results for input(s): HGBA1C in the last 72 hours. Lipid Profile No results for input(s): CHOL, HDL, LDLCALC, TRIG, CHOLHDL, LDLDIRECT in the last 72 hours. Thyroid function studies No results for input(s): TSH, T4TOTAL, T3FREE, THYROIDAB in the last 72 hours.  Invalid input(s): FREET3 Anemia work up No results for input(s): VITAMINB12, FOLATE, FERRITIN, TIBC, IRON, RETICCTPCT in the last 72 hours. Urinalysis    Component Value Date/Time   COLORURINE YELLOW 12/16/2021 0535   APPEARANCEUR CLOUDY (A) 12/16/2021 0535   LABSPEC 1.020 12/16/2021 0535   PHURINE 6.0 12/16/2021 0535   GLUCOSEU NEGATIVE 12/16/2021 0535   GLUCOSEU NEGATIVE 02/06/2020 1032   HGBUR MODERATE (A) 12/16/2021 0535   HGBUR negative 09/16/2010 0839   BILIRUBINUR NEGATIVE 12/16/2021 0535   BILIRUBINUR neg 02/22/2019 0958   KETONESUR NEGATIVE 12/16/2021 0535   PROTEINUR NEGATIVE 12/16/2021 0535   UROBILINOGEN 0.2 02/06/2020 1032   NITRITE NEGATIVE 12/16/2021 0535   LEUKOCYTESUR NEGATIVE 12/16/2021 0535   Sepsis Labs Invalid input(s): PROCALCITONIN,  WBC,  LACTICIDVEN Microbiology Recent Results (from the past 240 hour(s))  Resp Panel by RT-PCR (Flu A&B, Covid) Nasopharyngeal Swab     Status: None   Collection Time: 12/14/21  7:13 AM    Specimen: Nasopharyngeal Swab; Nasopharyngeal(NP) swabs in vial transport medium  Result Value Ref Range Status   SARS Coronavirus 2 by RT PCR NEGATIVE NEGATIVE Final    Comment: (NOTE) SARS-CoV-2 target nucleic acids are NOT DETECTED.  The SARS-CoV-2 RNA is generally detectable in upper respiratory specimens during the acute phase of infection. The lowest concentration of SARS-CoV-2 viral copies this assay can detect is 138 copies/mL. A negative result does not preclude SARS-Cov-2 infection and should not be used as the sole basis for treatment or other patient management decisions. A negative result may occur with  improper specimen collection/handling, submission of specimen other than nasopharyngeal swab, presence of viral mutation(s) within the areas targeted by this assay, and inadequate number of viral copies(<138 copies/mL). A negative result must be combined with clinical observations, patient history, and epidemiological information. The expected result is Negative.  Fact Sheet for Patients:  BloggerCourse.com  Fact Sheet for Healthcare Providers:  SeriousBroker.it  This test is no t yet approved or cleared by the [place] FDA and  has been authorized for detection and/or diagnosis of SARS-CoV-2 by FDA under an Emergency Use Authorization (EUA). This EUA will remain  in effect (meaning this test can be used) for the duration of the COVID-19 declaration under Section 564(b)(1) of the Act, 21 U.S.C.section 360bbb-3(b)(1), unless the authorization is terminated  or revoked sooner.       Influenza A by PCR NEGATIVE NEGATIVE Final   Influenza B by PCR NEGATIVE NEGATIVE Final    Comment: (NOTE) The Xpert Xpress SARS-CoV-2/FLU/RSV plus assay is intended as  an aid in the diagnosis of influenza from Nasopharyngeal swab specimens and should not be used as a sole basis for treatment. Nasal washings and aspirates are  unacceptable for Xpert Xpress SARS-CoV-2/FLU/RSV testing.  Fact Sheet for Patients: BloggerCourse.com  Fact Sheet for Healthcare Providers: SeriousBroker.it  This test is not yet approved or cleared by the [place] FDA and has been authorized for detection and/or diagnosis of SARS-CoV-2 by FDA under an Emergency Use Authorization (EUA). This EUA will remain in effect (meaning this test can be used) for the duration of the COVID-19 declaration under Section 564(b)(1) of the Act, 21 U.S.C. section 360bbb-3(b)(1), unless the authorization is terminated or revoked.  Performed at [facility], [address], [place], [place] [address]   Culture, blood (Routine x 2)     Status: None (Preliminary result)   Collection Time: 12/16/21  5:35 AM   Specimen: BLOOD  Result Value Ref Range Status   Specimen Description   Final    BLOOD BLOOD RIGHT FOREARM Performed at [facility], [address], [place], [place] [address]    Special Requests   Final    BOTTLES DRAWN AEROBIC AND ANAEROBIC Blood Culture results may not be optimal due to an inadequate volume of blood received in culture bottles Performed at [facility], [address], [place], [place] [address]    Culture   Final    NO GROWTH 4 DAYS Performed at [facility], [address] [place], [place] [address]    Report Status PENDING  Incomplete  Resp Panel by RT-PCR (Flu A&B, Covid) Nasopharyngeal Swab     Status: None   Collection Time: 12/16/21  5:35 AM   Specimen: Nasopharyngeal Swab; Nasopharyngeal(NP) swabs in vial transport medium  Result Value Ref Range Status   SARS Coronavirus 2 by RT PCR NEGATIVE NEGATIVE Final    Comment: (NOTE) SARS-CoV-2 target nucleic acids are NOT DETECTED.  The SARS-CoV-2 RNA is generally detectable in upper respiratory specimens during the acute phase of infection. The  lowest concentration of SARS-CoV-2 viral copies this assay can detect is 138 copies/mL. A negative result does not preclude SARS-Cov-2 infection and should not be used as the sole basis for treatment or other patient management decisions. A negative result may occur with  improper specimen collection/handling, submission of specimen other than nasopharyngeal swab, presence of viral mutation(s) within the areas targeted by this assay, and inadequate number of viral copies(<138 copies/mL). A negative result must be combined with clinical observations, patient history, and epidemiological information. The expected result is Negative.  Fact Sheet for Patients:  BloggerCourse.com  Fact Sheet for Healthcare Providers:  SeriousBroker.it  This test is no t yet approved or cleared by the [place] FDA and  has been authorized for detection and/or diagnosis of SARS-CoV-2 by FDA under an Emergency Use Authorization (EUA). This EUA will remain  in effect (meaning this test can be used) for the duration of the COVID-19 declaration under Section 564(b)(1) of the Act, 21 U.S.C.section 360bbb-3(b)(1), unless the authorization is terminated  or revoked sooner.       Influenza A by PCR NEGATIVE NEGATIVE Final   Influenza B by PCR NEGATIVE NEGATIVE Final    Comment: (NOTE) The Xpert Xpress SARS-CoV-2/FLU/RSV plus assay is intended as an aid in the diagnosis of influenza from Nasopharyngeal swab specimens and should not be used as a sole basis for treatment. Nasal washings and aspirates are unacceptable for Xpert Xpress SARS-CoV-2/FLU/RSV  testing.  Fact Sheet for Patients: BloggerCourse.com  Fact Sheet for Healthcare Providers: SeriousBroker.it  This test is not yet approved or cleared by the [place] FDA and has been authorized for detection and/or diagnosis of SARS-CoV-2 by FDA under  an Emergency Use Authorization (EUA). This EUA will remain in effect (meaning this test can be used) for the duration of the COVID-19 declaration under Section 564(b)(1) of the Act, 21 U.S.C. section 360bbb-3(b)(1), unless the authorization is terminated or revoked.  Performed at [facility], [address], [place], [place] [address]   Culture, blood (Routine x 2)     Status: None (Preliminary result)   Collection Time: 12/16/21  5:40 AM   Specimen: BLOOD  Result Value Ref Range Status   Specimen Description   Final    BLOOD BLOOD RIGHT FOREARM Performed at [facility], [address] [place], [place] [address]    Special Requests   Final    BOTTLES DRAWN AEROBIC AND ANAEROBIC Blood Culture results may not be optimal due to an inadequate volume of blood received in culture bottles Performed at [facility], [address] [place], [place] [address]    Culture   Final    NO GROWTH 4 DAYS Performed at [facility], [address] [place], [place] [address]    Report Status PENDING  Incomplete  MRSA Next Gen by PCR, Nasal     Status: None   Collection Time: 12/16/21 12:47 PM   Specimen: Nasal Mucosa; Nasal Swab  Result Value Ref Range Status   MRSA by PCR Next Gen NOT DETECTED NOT DETECTED Final    Comment: (NOTE) The GeneXpert MRSA Assay (FDA approved for NASAL specimens only), is one component of a comprehensive MRSA colonization surveillance program. It is not intended to diagnose MRSA infection nor to guide or monitor treatment for MRSA infections. Test performance is not FDA approved in patients less than 46 years old. Performed at [facility], [address] [place], [place] [address]      Time coordinating discharge: Over 30 minutes  SIGNED:   Cipriano Bunker, MD  [facility] 12/20/2021, 12:26 PM Pager   If 7PM-7AM, please contact night-coverage

## 2021-12-20 NOTE — TOC Transition Note (Addendum)
Transition of Care [facility]) - CM/SW Discharge Note   Patient Details  Name: [name] [name] MRN: [id number] Date of Birth: 04-07-1935  Transition of Care [employer]) CM/SW Contact:  Golda Acre, RN Phone Number: 12/20/2021, 12:17 PM   Clinical Narrative:    Discharge transport packet to the unit clerk.  RN will call ptar, number to call and information to give is on the front of the dc packet. Pt going back to [facility] per Delorise Shiner will go to room 103.]     Barriers to Discharge: Continued Medical Work up   Patient Goals and CMS Choice Patient states their goals for this hospitalization and ongoing recovery are:: ams CMS Medicare.gov Compare Post Acute Care list provided to:: Patient Represenative (must comment) (wife) Choice offered to / list presented to : Spouse  Discharge Placement                       Discharge Plan and Services   Discharge Planning Services: CM Consult Post Acute Care Choice: Skilled Nursing Facility                               Social Determinants of Health (SDOH) Interventions     Readmission Risk Interventions Readmission Risk Prevention Plan 10/22/2021  Transportation Screening Complete  Medication Review [employer]) Referral to Pharmacy  PCP or Specialist appointment within 3-5 days of discharge Complete  HRI or Home Care Consult Not Complete  HRI or Home Care Consult Pt Refusal Comments from SNF  SW Recovery Care/Counseling Consult Complete  Palliative Care Screening Not Applicable  Skilled Nursing Facility Complete  Some recent data might be hidden

## 2021-12-20 NOTE — Discharge Instructions (Signed)
Advised to follow-up with primary care physician in 1 week. Advised to take Augmentin twice a day for 3 more days through PEG tube. Patient is return back to skilled nursing facility for rehab with hospice following.

## 2021-12-20 NOTE — Progress Notes (Signed)
Clarified with Dr Lucianne Muss to leave foley catheter in place for discharge. Pt will be discharged with foley.

## 2021-12-21 LAB — CULTURE, BLOOD (ROUTINE X 2)
Culture: NO GROWTH
Culture: NO GROWTH

## 2023-01-29 IMAGING — CT CT HEAD W/O CM
4 series · 15 of 47 positions shown, 17 images · non-contrast
Comparison: MRI of the brain on 03/10/2021, CT head on 03/09/2021

CLINICAL DATA: Increased falls recently. Weakness. History of
dementia.

EXAM:
CT HEAD WITHOUT CONTRAST
CT CERVICAL SPINE WITHOUT CONTRAST
TECHNIQUE: Multidetector CT imaging of the head and cervical spine was
performed following the standard protocol without intravenous
contrast. Multiplanar CT image reconstructions of the cervical spine
were also generated.

[Series 3: head without · axial · non-contrast · 0.43mm/px · z∈[-74,+41]mm · 7 of 31 slices shown, 9 images]
[im 4/31  brain]
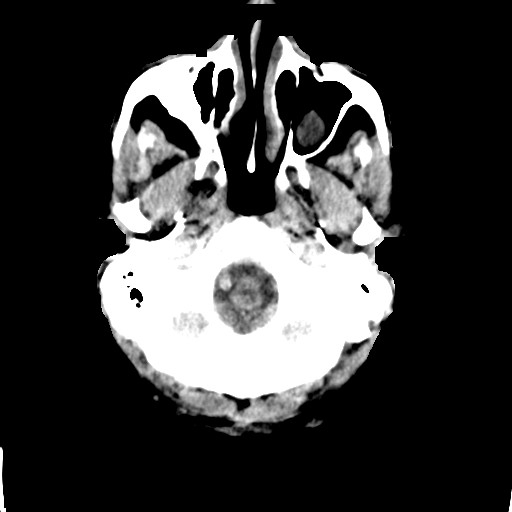
[im 4/31  bone]
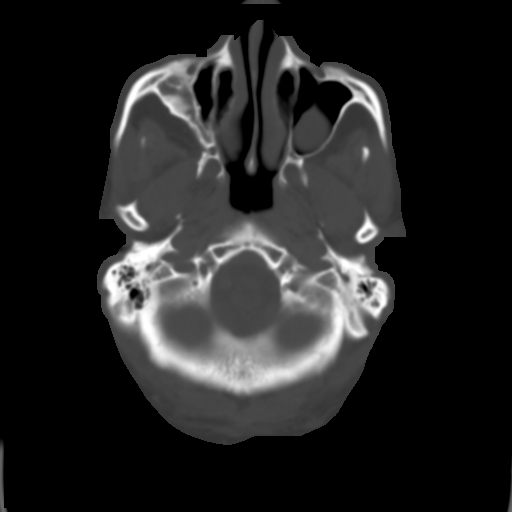
[im 8/31  brain]
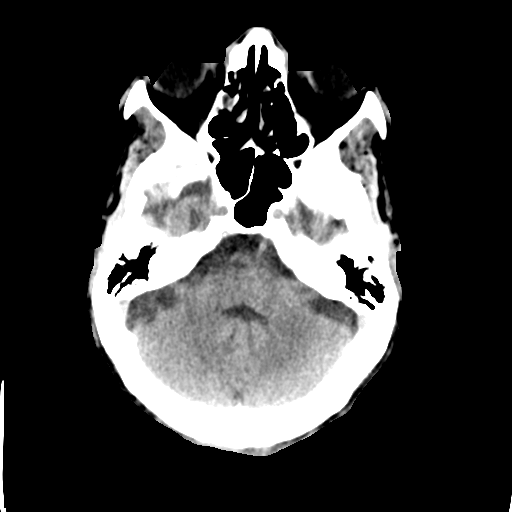
[im 12/31  brain]
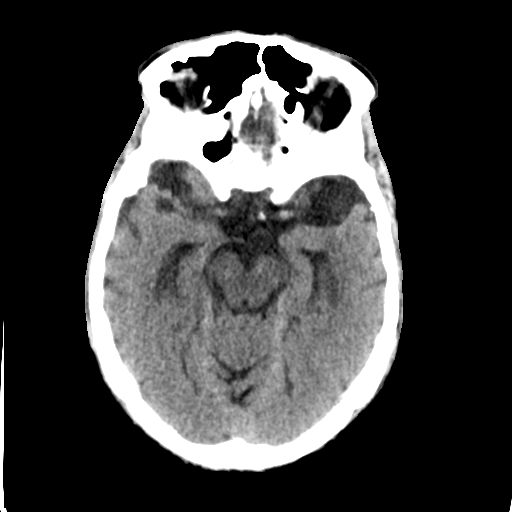
[im 16/31  brain]
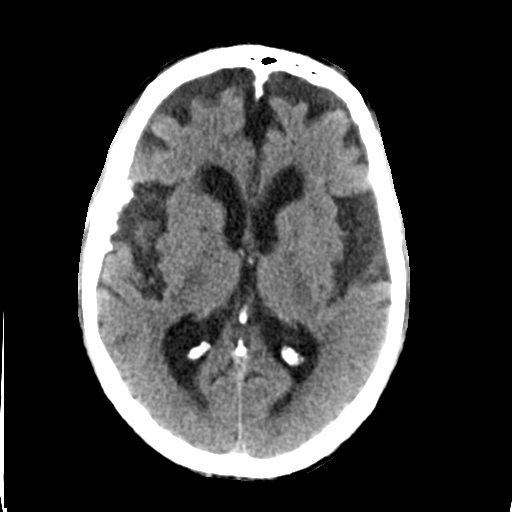
[im 19/31  brain]
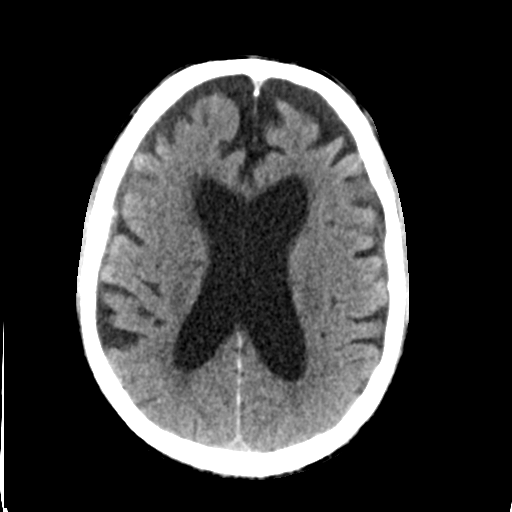
[im 19/31  bone]
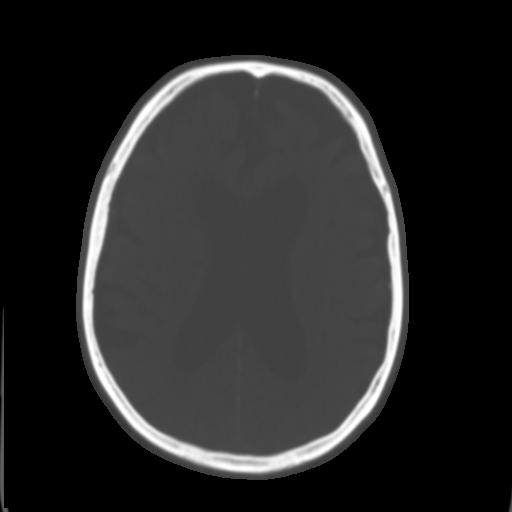
[im 23/31  brain]
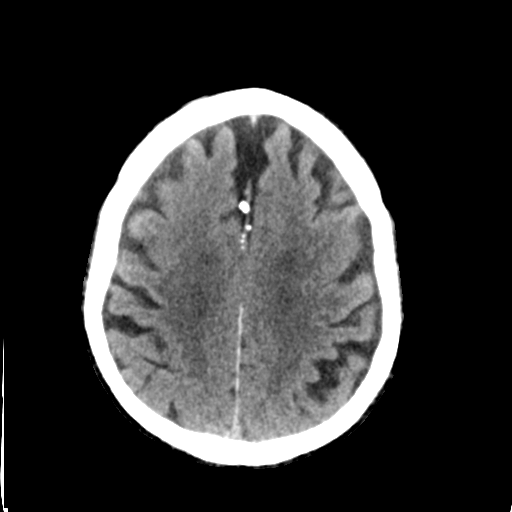
[im 27/31  brain]
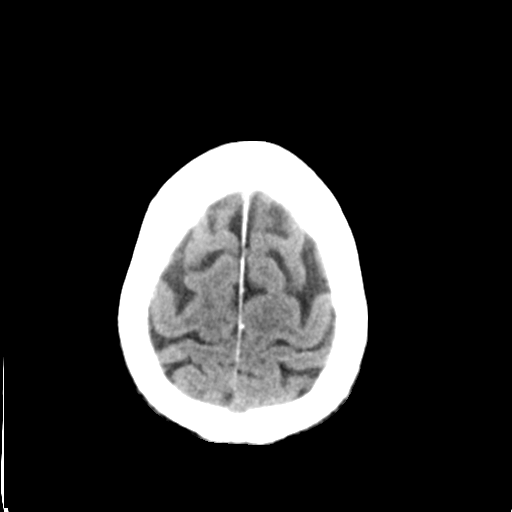

[Series 4: head bone · axial · 0.43mm/px · z∈[-75,-59]mm · 2 of 76 slices shown]
[im 8/76  bone]
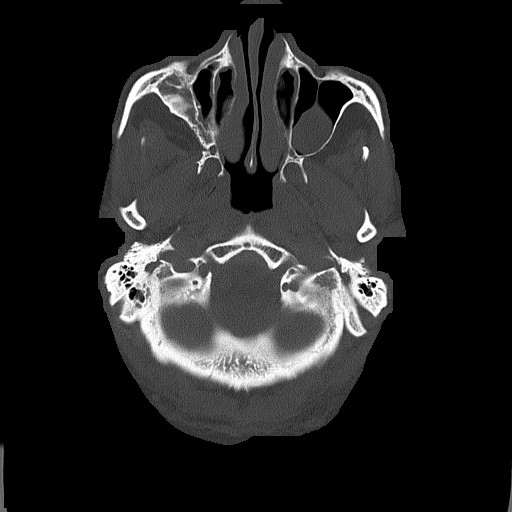
[im 16/76  bone]
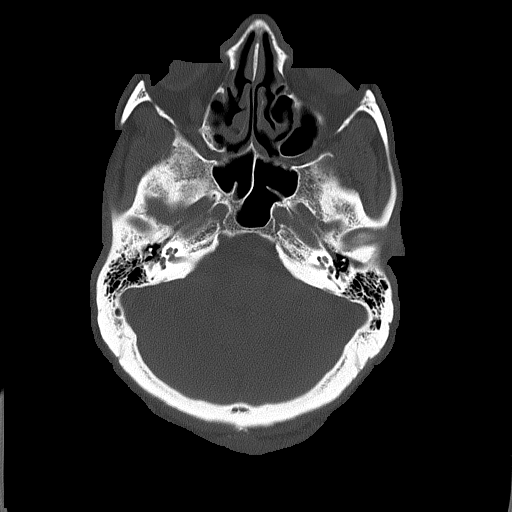

[Series 5: head without cor · coronal · non-contrast · 0.33mm/px · 3 of 67 slices shown]
[im 23/67  brain]
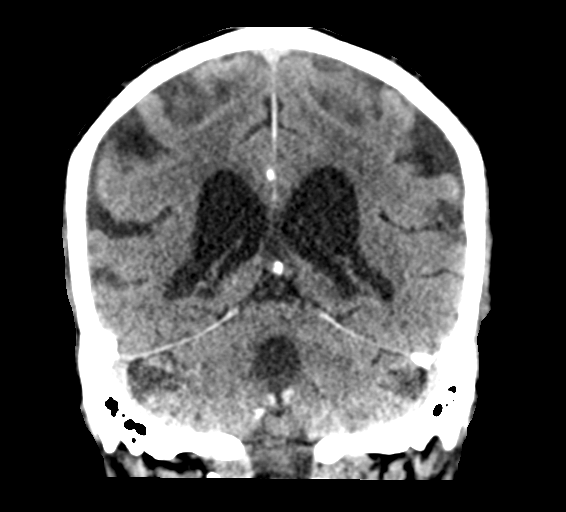
[im 30/67  brain]
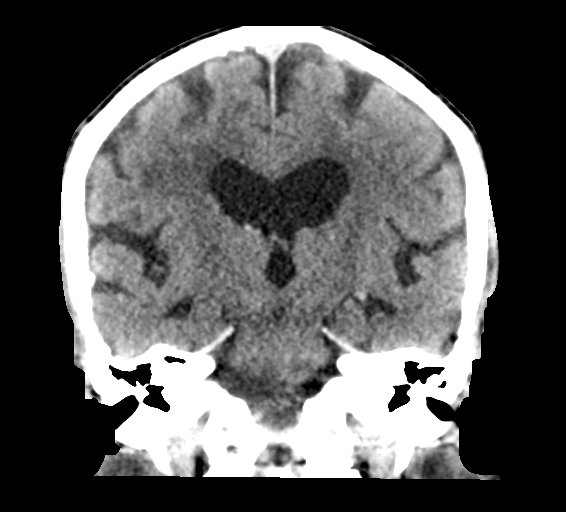
[im 37/67  brain]
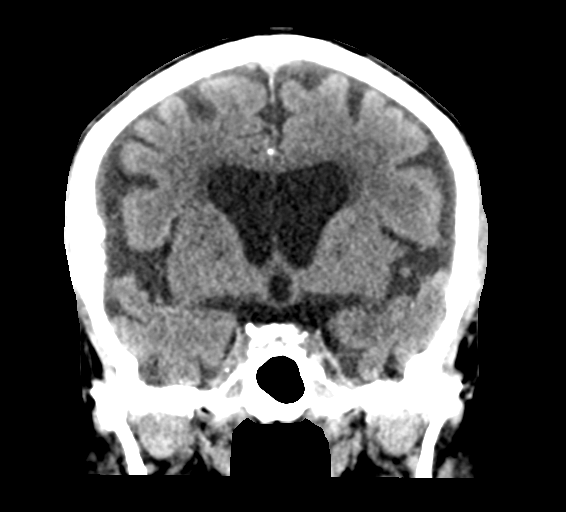

[Series 6: head without sag · sagittal · non-contrast · 0.34mm/px · 3 of 54 slices shown]
[im 18/54  brain]
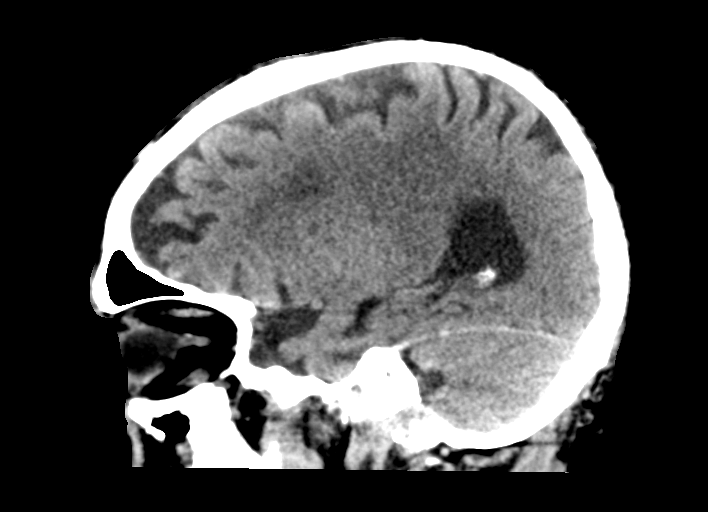
[im 27/54  brain]
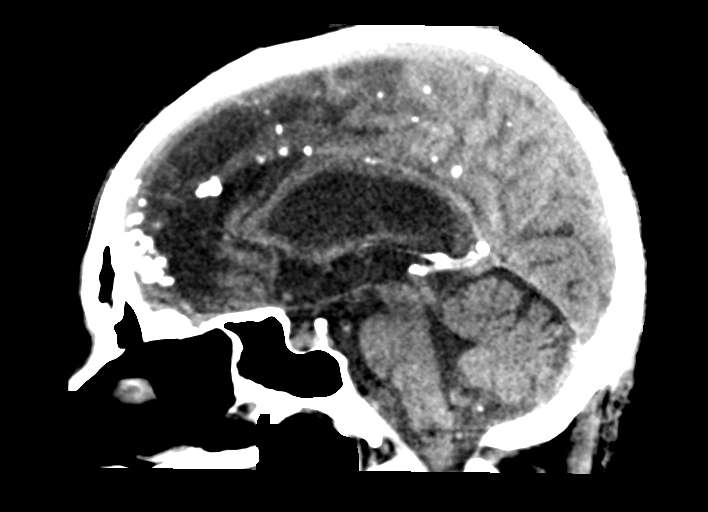
[im 36/54  brain]
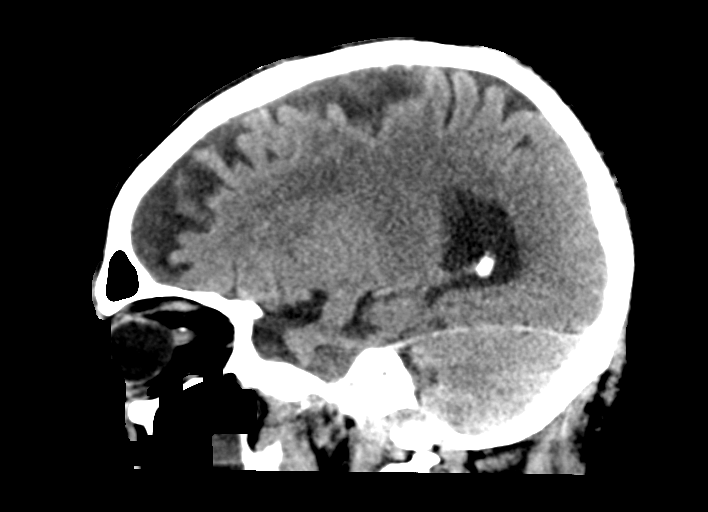

[15 of 47 positions shown; findings below may reference images not displayed]

FINDINGS: CT HEAD FINDINGS

Brain: There is significant central and cortical atrophy. Calcified
extra-axial lesion in the RIGHT frontotemporal region is stable.
There is no evidence for hemorrhage, mass lesion, or acute
infarction.

Vascular: There is dense atherosclerotic calcification of the
internal carotid arteries. No hyperdense vessels.

Skull: Normal. Negative for fracture or focal lesion.

Sinuses/Orbits: Chronic significant mucosal thickening and LEFT
maxillary sinus opacification. No acute sinusitis. Orbits are
unremarkable.

Other: None

CT CERVICAL SPINE FINDINGS

Alignment: Normal.

Skull base and vertebrae: No acute fracture. No primary bone lesion
or focal pathologic process.

Soft tissues and spinal canal: No prevertebral fluid or swelling. No
visible canal hematoma.

Disc levels: There is significant degenerative change throughout the
cervical spine, notably at C2-3, C3-4, C4-5, and C5-6. There is
significant bilateral foraminal narrowing at these levels. There is
mild bilateral foraminal narrowing at C6-7.

Upper chest: Negative.

Other: None
IMPRESSION: 1. No evidence for acute intracranial abnormality.
2. Significant atrophy.
3. Stable appearance of calcified extra-axial lesion in the RIGHT
frontotemporal region, consistent with meningioma.
4. Significant degenerative changes in the cervical spine.
5. No evidence for acute cervical spine abnormality.

## 2023-01-29 IMAGING — DX DG CHEST 2V
2 series · 2 of 2 positions shown · non-contrast
Comparison: 03/09/2021

CLINICAL DATA: Recent fall

Cough
Generalized weakness
EXAM:
CHEST - 2 VIEW

[chest lat]
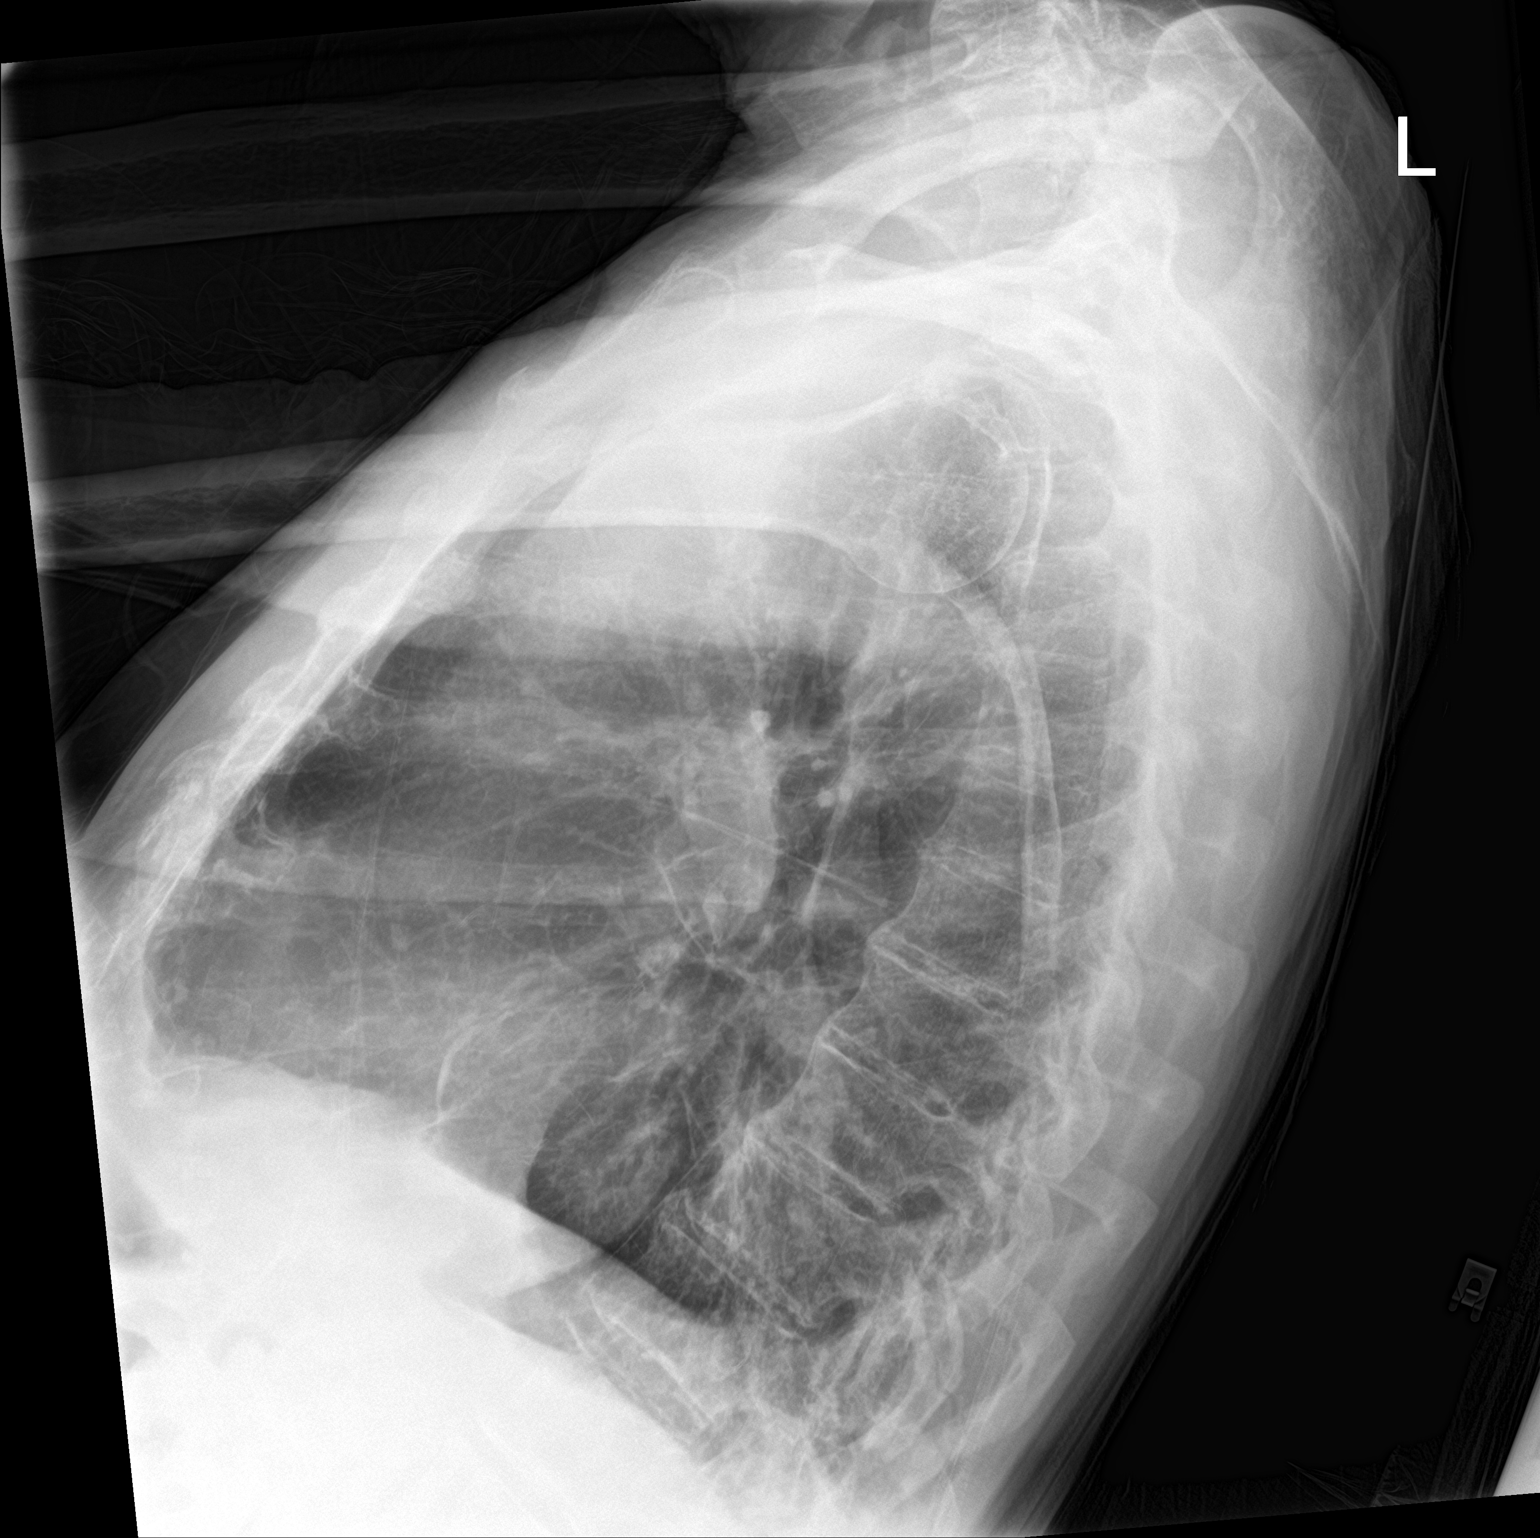

[chest ap]
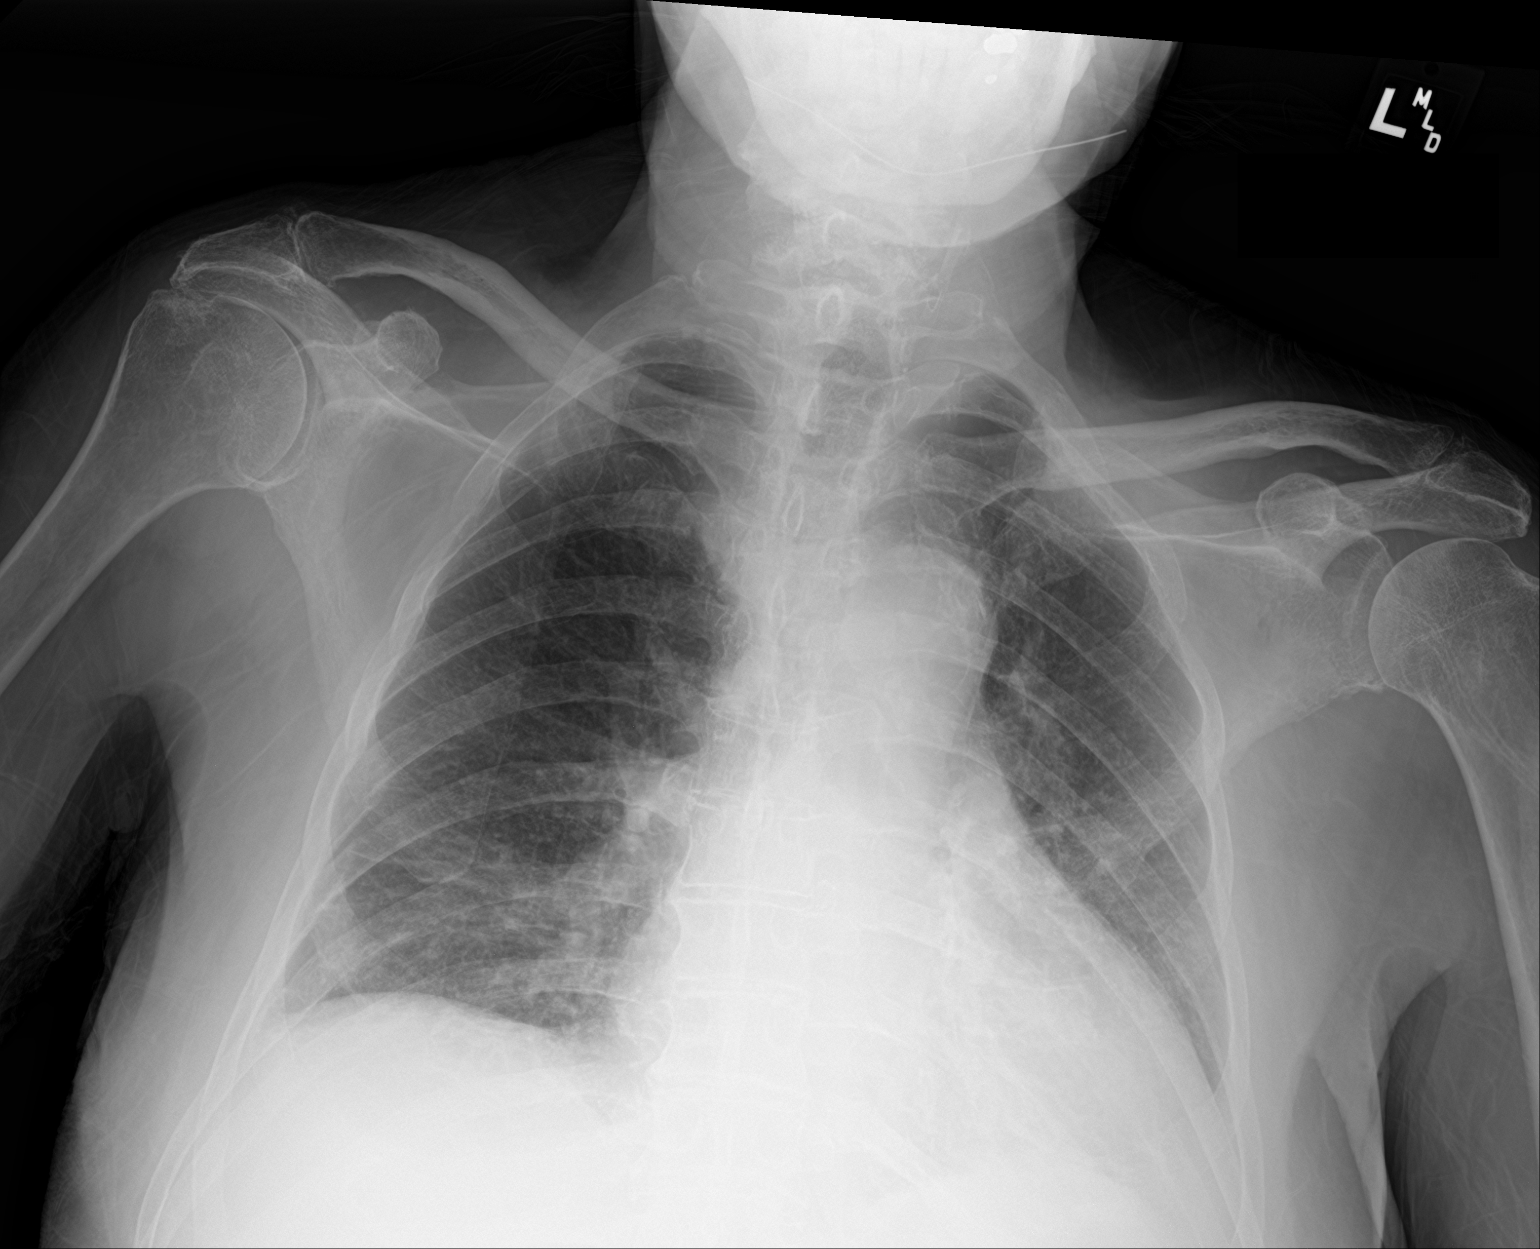

[2 of 2 positions shown; findings below may reference images not displayed]

FINDINGS: Unchanged cardiomegaly. There are bibasilar airspace opacities. No
significant pulmonary vascular congestion.
IMPRESSION: Bibasilar airspace opacities suspicious for pneumonia.
